# Patient Record
Sex: Female | Born: 1965
Health system: Southern US, Community
[De-identification: ages and names within clinical notes are randomized; demographics above are authoritative.]

## PROBLEM LIST (undated history)

## (undated) DIAGNOSIS — Z801 Family history of malignant neoplasm of trachea, bronchus and lung: Secondary | ICD-10-CM

## (undated) DIAGNOSIS — Z923 Personal history of irradiation: Secondary | ICD-10-CM

## (undated) DIAGNOSIS — Z800 Family history of malignant neoplasm of digestive organs: Secondary | ICD-10-CM

## (undated) HISTORY — PX: OTHER SURGICAL HISTORY: SHX169

## (undated) HISTORY — DX: Family history of malignant neoplasm of trachea, bronchus and lung: Z80.1

## (undated) HISTORY — DX: Family history of malignant neoplasm of digestive organs: Z80.0

---

## 2016-09-18 ENCOUNTER — Encounter: Payer: Self-pay | Admitting: Osteopathic Medicine

## 2016-09-18 ENCOUNTER — Ambulatory Visit (INDEPENDENT_AMBULATORY_CARE_PROVIDER_SITE_OTHER): Payer: BLUE CROSS/BLUE SHIELD | Admitting: Osteopathic Medicine

## 2016-09-18 VITALS — BP 123/69 | HR 67 | Ht 61.0 in | Wt 177.0 lb

## 2016-09-18 DIAGNOSIS — N951 Menopausal and female climacteric states: Secondary | ICD-10-CM | POA: Diagnosis not present

## 2016-09-18 DIAGNOSIS — Z0000 Encounter for general adult medical examination without abnormal findings: Secondary | ICD-10-CM

## 2016-09-18 DIAGNOSIS — G4700 Insomnia, unspecified: Secondary | ICD-10-CM | POA: Diagnosis not present

## 2016-09-18 DIAGNOSIS — E559 Vitamin D deficiency, unspecified: Secondary | ICD-10-CM

## 2016-09-18 DIAGNOSIS — J302 Other seasonal allergic rhinitis: Secondary | ICD-10-CM | POA: Diagnosis not present

## 2016-09-18 LAB — CBC WITH DIFFERENTIAL/PLATELET
BASOS PCT: 0 %
Basophils Absolute: 0 cells/uL (ref 0–200)
Eosinophils Absolute: 41 cells/uL (ref 15–500)
Eosinophils Relative: 1 %
HCT: 37.1 % (ref 35.0–45.0)
Hemoglobin: 12.1 g/dL (ref 11.7–15.5)
LYMPHS PCT: 24 %
Lymphs Abs: 984 cells/uL (ref 850–3900)
MCH: 27.9 pg (ref 27.0–33.0)
MCHC: 32.6 g/dL (ref 32.0–36.0)
MCV: 85.7 fL (ref 80.0–100.0)
MPV: 11.5 fL (ref 7.5–12.5)
Monocytes Absolute: 492 cells/uL (ref 200–950)
Monocytes Relative: 12 %
NEUTROS PCT: 63 %
Neutro Abs: 2583 cells/uL (ref 1500–7800)
Platelets: 202 10*3/uL (ref 140–400)
RBC: 4.33 MIL/uL (ref 3.80–5.10)
RDW: 14.9 % (ref 11.0–15.0)
WBC: 4.1 10*3/uL (ref 3.8–10.8)

## 2016-09-18 LAB — LIPID PANEL
CHOL/HDL RATIO: 2.6 ratio (ref <5.0)
CHOLESTEROL: 195 mg/dL (ref <200)
HDL: 75 mg/dL (ref >50)
LDL CALC: 112 mg/dL — AB (ref <100)
TRIGLYCERIDES: 42 mg/dL (ref <150)
VLDL: 8 mg/dL (ref <30)

## 2016-09-18 LAB — COMPLETE METABOLIC PANEL WITH GFR
ALT: 7 U/L (ref 6–29)
AST: 14 U/L (ref 10–35)
Albumin: 3.9 g/dL (ref 3.6–5.1)
Alkaline Phosphatase: 52 U/L (ref 33–115)
BUN: 8 mg/dL (ref 7–25)
CHLORIDE: 105 mmol/L (ref 98–110)
CO2: 26 mmol/L (ref 20–31)
CREATININE: 0.64 mg/dL (ref 0.50–1.10)
Calcium: 9 mg/dL (ref 8.6–10.2)
GFR, Est Non African American: >89 mL/min (ref >=60)
Glucose, Bld: 86 mg/dL (ref 65–99)
POTASSIUM: 4.3 mmol/L (ref 3.5–5.3)
Sodium: 138 mmol/L (ref 135–146)
Total Bilirubin: 0.3 mg/dL (ref 0.2–1.2)
Total Protein: 6.3 g/dL (ref 6.1–8.1)

## 2016-09-18 LAB — TSH: TSH: 1.82 mIU/L

## 2016-09-18 MED ORDER — TRAZODONE HCL 50 MG PO TABS: 25.0000 mg | ORAL_TABLET | Freq: Every day | ORAL | 0 refills | Status: DC

## 2016-09-18 MED ORDER — FLUTICASONE PROPIONATE 50 MCG/ACT NA SUSP: 2.0000 | Freq: Every day | NASAL | 6 refills | Status: DC

## 2016-09-18 NOTE — Progress Notes (Addendum)
HPI: Rebecca Bullock is a 51 y.o. female  who presents to [facility] today, 09/25/16,  for chief complaint of:  Chief Complaint  Patient presents with  . Establish Care    annual and menopause concerns   Perimenopause: irregular periods, occasionally w/ prolonged bleeding versus few months between periods. Ongoing about a year. Not particularly painful but duration is bothersome.   Seasonal allergies: antihistamines tend to cause sleepiness, Never tried nasal sprays on a routine basis.   Sleep: problems sleeping several nights out of the week. Melatonin occasionally helpful when takes this an hour or so before bedtime. Onset and maintenance is a problem. Is avoiding bright lights and screens.   Due for annual physical. >15 years since last checkup   Past medical, surgical, social and family history reviewed: There are no active problems to display for this patient.  History reviewed. No pertinent surgical history. Social History  Substance Use Topics  . Smoking status: Never Smoker  . Smokeless tobacco: Never Used  . Alcohol use Not on file   Family History  Problem Relation Age of Onset  . Alcohol abuse Maternal Uncle   . Cancer Mother   . Cancer Father   . Diabetes Maternal Grandmother      Current medication list and allergy/intolerance information reviewed:   Current Outpatient Prescriptions  Medication Sig Dispense Refill  . fluticasone (FLONASE) 50 MCG/ACT nasal spray Place 2 sprays into both nostrils daily. 16 g 6  . traZODone (DESYREL) 50 MG tablet Take 0.5-3 tablets (25-150 mg total) by mouth at bedtime. 90 tablet 0  . Vitamin D, Ergocalciferol, (DRISDOL) 50000 units CAPS capsule Take 1 capsule (50,000 Units total) by mouth every 7 (seven) days. Take for 8 total doses(weeks) 8 capsule 1   No current facility-administered medications for this visit.    No Known Allergies    Review of Systems:  Constitutional:  No  fever, no  chills, No recent illness, No unintentional weight changes. No significant fatigue.   HEENT: No  headache, no hearing change, No sore throat, No  sinus pressure, +hay fever/allergies   Cardiac: No  chest pain, No  pressure, No palpitations, No  Orthopnea  Respiratory:  No  shortness of breath. No  Cough  Gastrointestinal: No  abdominal pain, No  nausea, No  vomiting,  No  blood in stool, No  diarrhea, No  constipation   Musculoskeletal: No new myalgia/arthralgia  Genitourinary: No  incontinence, No  abnormal genital bleeding, No abnormal genital discharge  Skin: No  Rash, No other wounds/concerning lesions  Hem/Onc: No  easy bruising/bleeding, No  abnormal lymph node  Endocrine: No cold intolerance,  No heat intolerance. No polyuria/polydipsia/polyphagia   Neurologic: No  weakness, No  dizziness, No  slurred speech/focal weakness/facial droop  Psychiatric: No  concerns with depression, No  concerns with anxiety, +sleep problems, No mood problems  Exam:  BP 123/69   Pulse 67   Ht 5\' 1"  (1.549 m)   Wt 177 lb (80.3 kg)   BMI 33.44 kg/m   Constitutional: VS see above. General Appearance: alert, well-developed, well-nourished, NAD  Eyes: Normal lids and conjunctive, non-icteric sclera  Ears, Nose, Mouth, Throat: MMM, Normal external inspection ears/nares/mouth/lips/gums.   Neck: No masses, trachea midline.   Respiratory: Normal respiratory effort. no wheeze, no rhonchi, no rales  Cardiovascular: S1/S2 normal, no murmur, no rub/gallop auscultated. RRR.   Musculoskeletal: Gait normal. No clubbing/cyanosis of digits.   Neurological: Normal balance/coordination. No tremor.  Skin: warm, dry, intact.   Psychiatric: Normal judgment/insight. Normal mood and affect. Oriented x3.     ASSESSMENT/PLAN:   Perimenopausal - Consider OCP for regulation vs Mirena or OBGYN referral for heavy bleeding  Insomnia, unspecified type - Trial trazodone, info printed on sleep hygiene -  Plan: traZODone (DESYREL) 50 MG tablet  Seasonal allergic rhinitis, unspecified trigger - Allegra may be less sedating. Nasal steroid trial +/- oral meds.  - Plan: fluticasone (FLONASE) 50 MCG/ACT nasal spray  Annual physical exam - labs ordered for future visit - preventive care was not performed or billed today - Plan: CBC with Differential/Platelet, COMPLETE METABOLIC PANEL WITH GFR, Lipid panel, TSH, VITAMIN D 25 Hydroxy (Vit-D Deficiency, Fractures)  Vitamin D deficiency    Visit summary with medication list and pertinent instructions was printed for patient to review. All questions at time of visit were answered - patient instructed to contact office with any additional concerns. ER/RTC precautions were reviewed with the patient. Follow-up plan: Return in about 4 weeks (around 10/16/2016) for [employer] .  Note: Total time spent 30 minutes, greater than 50% of the visit was spent face-to-face counseling and coordinating care for the following: The primary encounter diagnosis was Perimenopausal. Diagnoses of Insomnia, unspecified type, Seasonal allergic rhinitis, unspecified trigger were also pertinent to this visit.Marland Kitchen

## 2016-09-19 LAB — VITAMIN D 25 HYDROXY (VIT D DEFICIENCY, FRACTURES): Vit D, 25-Hydroxy: 16 ng/mL — ABNORMAL LOW (ref 30–100)

## 2016-09-25 DIAGNOSIS — J302 Other seasonal allergic rhinitis: Secondary | ICD-10-CM | POA: Insufficient documentation

## 2016-09-25 DIAGNOSIS — N951 Menopausal and female climacteric states: Secondary | ICD-10-CM | POA: Insufficient documentation

## 2016-09-25 DIAGNOSIS — E559 Vitamin D deficiency, unspecified: Secondary | ICD-10-CM | POA: Insufficient documentation

## 2016-09-25 DIAGNOSIS — G4700 Insomnia, unspecified: Secondary | ICD-10-CM | POA: Insufficient documentation

## 2016-09-25 MED ORDER — VITAMIN D (ERGOCALCIFEROL) 1.25 MG (50000 UNIT) PO CAPS: 50000.0000 [IU] | ORAL_CAPSULE | ORAL | 1 refills | Status: DC

## 2016-09-25 NOTE — Addendum Note (Signed)
Addended by: Maryla Morrow on: 09/25/2016 02:08 PM   Modules accepted: Orders

## 2016-10-02 ENCOUNTER — Encounter: Payer: Self-pay | Admitting: Osteopathic Medicine

## 2016-10-02 ENCOUNTER — Ambulatory Visit (INDEPENDENT_AMBULATORY_CARE_PROVIDER_SITE_OTHER): Payer: BLUE CROSS/BLUE SHIELD | Admitting: Osteopathic Medicine

## 2016-10-02 ENCOUNTER — Other Ambulatory Visit (HOSPITAL_COMMUNITY)
Admission: RE | Admit: 2016-10-02 | Discharge: 2016-10-02 | Disposition: A | Discharge status: Home / Self Care | Payer: BLUE CROSS/BLUE SHIELD | Source: Ambulatory Visit | Attending: Osteopathic Medicine | Admitting: Osteopathic Medicine

## 2016-10-02 VITALS — BP 128/76 | HR 81 | Ht 61.0 in | Wt 173.0 lb

## 2016-10-02 DIAGNOSIS — Z124 Encounter for screening for malignant neoplasm of cervix: Secondary | ICD-10-CM | POA: Insufficient documentation

## 2016-10-02 DIAGNOSIS — Z0000 Encounter for general adult medical examination without abnormal findings: Secondary | ICD-10-CM | POA: Diagnosis not present

## 2016-10-02 DIAGNOSIS — N939 Abnormal uterine and vaginal bleeding, unspecified: Secondary | ICD-10-CM | POA: Diagnosis not present

## 2016-10-02 MED ORDER — ONDANSETRON 4 MG PO TBDP: 8.0000 mg | ORAL_TABLET | Freq: Three times a day (TID) | ORAL | 1 refills | Status: DC | PRN

## 2016-10-02 MED ORDER — NORETHINDRONE-ETH ESTRADIOL 1-35 MG-MCG PO TABS: ORAL_TABLET | ORAL | 1 refills | Status: DC

## 2016-10-02 NOTE — Patient Instructions (Addendum)
Plan:  If bleeding persists, our options are:  Medication to help stop the bleeding and reset your cycle  +/- Mirena IUD insertion if that doesn't work  Referral to OBGYN to discuss if procedure such as ablation or hysterectomy is an option if this persists  Annual physical: No concerns at this time Plan to recheck in one year, sooner if needed  Any refills needed, please let us know, we can take care of it for the next year!

## 2016-10-02 NOTE — Progress Notes (Signed)
HPI: Rebecca Bullock is a 51 y.o. female  who presents to [facility] today, 10/02/16,  for chief complaint of:  Chief Complaint  Patient presents with  . Annual Exam  . Gynecologic Exam    Annual physical: See below for review of preventive care  Additional problem follow-up: Abnormal uterine bleeding. Patient is perimenopausal, has been having irregular periods, heavy bleeding on and off for several months at this point. Labs do not demonstrate anemia, patient states that occasionally she is having days where she uses tampon and pad every hour or so, lately has been a good deal B she would still like something to stop this.  Past medical, surgical, social and family history reviewed: Patient Active Problem List   Diagnosis Date Noted  . Perimenopausal 09/25/2016  . Insomnia 09/25/2016  . Seasonal allergic rhinitis 09/25/2016  . Vitamin D deficiency 09/25/2016   No past surgical history on file. Social History  Substance Use Topics  . Smoking status: Never Smoker  . Smokeless tobacco: Never Used  . Alcohol use Not on file   Family History  Problem Relation Age of Onset  . Alcohol abuse Maternal Uncle   . Cancer Mother   . Cancer Father   . Diabetes Maternal Grandmother      Current medication list and allergy/intolerance information reviewed:   Current Outpatient Prescriptions  Medication Sig Dispense Refill  . fluticasone (FLONASE) 50 MCG/ACT nasal spray Place 2 sprays into both nostrils daily. 16 g 6  . traZODone (DESYREL) 50 MG tablet Take 0.5-3 tablets (25-150 mg total) by mouth at bedtime. 90 tablet 0  . Vitamin D, Ergocalciferol, (DRISDOL) 50000 units CAPS capsule Take 1 capsule (50,000 Units total) by mouth every 7 (seven) days. Take for 8 total doses(weeks) 8 capsule 1   No current facility-administered medications for this visit.    No Known Allergies    Review of Systems:  Constitutional:  No  fever, no chills, No recent  illness  HEENT: No  headache, no vision change  Cardiac: No  chest pain, No  pressure, No palpitationsOrthopnea  Respiratory:  No  shortness of breath. No  Cough  Gastrointestinal: No  abdominal pain, No  nausea  Musculoskeletal: No new myalgia/arthralgia  Genitourinary: No  incontinence, +abnormal genital bleeding, No abnormal genital discharge  Skin: No  Rash, No other wounds/concerning lesions  Neurologic: No  weakness, No  dizziness,   Psychiatric: No  concerns with depression, No  concerns with anxiety, No sleep problems, No mood problems  Exam:  BP 128/76   Pulse 81   Ht '5\' 1"'  (1.549 m)   Wt 173 lb (78.5 kg)   BMI 32.69 kg/m   Constitutional: VS see above. General Appearance: alert, well-developed, well-nourished, NAD  Eyes: Normal lids and conjunctive, non-icteric sclera  Ears, Nose, Mouth, Throat: MMM, Normal external inspection ears/nares/mouth/lips/gums.   Neck: No masses, trachea midline. No thyroid enlargement. No tenderness/mass appreciated. No lymphadenopathy  Respiratory: Normal respiratory effort. no wheeze, no rhonchi, no rales  Cardiovascular: S1/S2 normal, no murmur, no rub/gallop auscultated. RRR. No lower extremity edema.   Gastrointestinal: Nontender, no masses. No hepatomegaly, no splenomegaly. No hernia appreciated. Bowel sounds normal. Rectal exam deferred.   Musculoskeletal: Gait normal. No clubbing/cyanosis of digits.   Neurological: Normal balance/coordination. No tremor. No cranial nerve deficit on limited exam. Motor and sensation intact and symmetric. Cerebellar reflexes intact.   Skin: warm, dry, intact. No rash/ulcer. No concerning nevi or subq nodules on limited exam.  Psychiatric: Normal judgment/insight. Normal mood and affect. Oriented x3.  GYN: No lesions/ulcers to external genitalia, normal urethra, normal vaginal mucosa, physiologic discharge and normal blood, parous cervix normal without concerning lesions, small bartholin  cyst but no visible polyp, uterus not enlarged or tender, adnexa no masses and nontender  BREAST: No rashes/skin changes, normal fibrous breast tissue, no masses or tenderness, normal nipple without discharge, normal axilla   Recent Results (from the past 2160 hour(s))  CBC with Differential/Platelet     Status: None   Collection Time: 09/18/16  9:41 AM  Result Value Ref Range   WBC 4.1 3.8 - 10.8 K/uL   RBC 4.33 3.80 - 5.10 MIL/uL   Hemoglobin 12.1 11.7 - 15.5 g/dL   HCT 37.1 35.0 - 45.0 %   MCV 85.7 80.0 - 100.0 fL   MCH 27.9 27.0 - 33.0 pg   MCHC 32.6 32.0 - 36.0 g/dL   RDW 14.9 11.0 - 15.0 %   Platelets 202 140 - 400 K/uL   MPV 11.5 7.5 - 12.5 fL   Neutro Abs 2,583 1,500 - 7,800 cells/uL   Lymphs Abs 984 850 - 3,900 cells/uL   Monocytes Absolute 492 200 - 950 cells/uL   Eosinophils Absolute 41 15 - 500 cells/uL   Basophils Absolute 0 0 - 200 cells/uL   Neutrophils Relative % 63 %   Lymphocytes Relative 24 %   Monocytes Relative 12 %   Eosinophils Relative 1 %   Basophils Relative 0 %   Smear Review Criteria for review not met   COMPLETE METABOLIC PANEL WITH GFR     Status: None   Collection Time: 09/18/16  9:41 AM  Result Value Ref Range   Sodium 138 135 - 146 mmol/L   Potassium 4.3 3.5 - 5.3 mmol/L   Chloride 105 98 - 110 mmol/L   CO2 26 20 - 31 mmol/L   Glucose, Bld 86 65 - 99 mg/dL   BUN 8 7 - 25 mg/dL   Creat 0.64 0.50 - 1.10 mg/dL   Total Bilirubin 0.3 0.2 - 1.2 mg/dL   Alkaline Phosphatase 52 33 - 115 U/L   AST 14 10 - 35 U/L   ALT 7 6 - 29 U/L   Total Protein 6.3 6.1 - 8.1 g/dL   Albumin 3.9 3.6 - 5.1 g/dL   Calcium 9.0 8.6 - 10.2 mg/dL   GFR, Est African American >89 >=60 mL/min   GFR, Est Non African American >89 >=60 mL/min  Lipid panel     Status: Abnormal   Collection Time: 09/18/16  9:41 AM  Result Value Ref Range   Cholesterol 195 <200 mg/dL   Triglycerides 42 <150 mg/dL   HDL 75 >50 mg/dL   Total CHOL/HDL Ratio 2.6 <5.0 Ratio   VLDL 8 <30  mg/dL   LDL Cholesterol 112 (H) <100 mg/dL  TSH     Status: None   Collection Time: 09/18/16  9:41 AM  Result Value Ref Range   TSH 1.82 mIU/L    Comment:   Reference Range   > or = 20 Years  0.40-4.50   Pregnancy Range First trimester  0.26-2.66 Second trimester 0.55-2.73 Third trimester  0.43-2.91     VITAMIN D 25 Hydroxy (Vit-D Deficiency, Fractures)     Status: Abnormal   Collection Time: 09/18/16  9:41 AM  Result Value Ref Range   Vit D, 25-Hydroxy 16 (L) 30 - 100 ng/mL    Comment: Vitamin D Status  25-OH Vitamin D        Deficiency                <20 ng/mL        Insufficiency         20 - 29 ng/mL        Optimal             > or = 30 ng/mL   For 25-OH Vitamin D testing on patients on D2-supplementation and patients for whom quantitation of D2 and D3 fractions is required, the QuestAssureD 25-OH VIT D, (D2,D3), LC/MS/MS is recommended: order code [phone] (patients > 2 yrs).      ASSESSMENT/PLAN:   Annual physical exam  Cervical cancer screening - Plan: Cytology - PAP  Abnormal uterine bleeding - High-dose birth control, added nausea medication in case side effects. Patient would like referral to OB/GYN to discuss ablation vs hysterectomy vs Mirena - Plan: norethindrone-ethinyl estradiol 1/35 ([place] 1/35, 28,) tablet, ondansetron (ZOFRAN-ODT) 4 MG disintegrating tablet, Ambulatory referral to Obstetrics / Gynecology   FEMALE PREVENTIVE CARE Updated 10/02/16   ANNUAL SCREENING/COUNSELING  Diet/Exercise - HEALTHY HABITS DISCUSSED TO DECREASE CV RISK History  Smoking Status  . Never Smoker  Smokeless Tobacco  . Never Used   History  Alcohol use Not on file  About 1-2 per week   Depression screen [facility] 2/9 09/18/2016  Decreased Interest 0  Down, Depressed, Hopeless 0  PHQ - 2 Score 0    Domestic violence concerns - no  HTN SCREENING - SEE [place]  Sexually active in the past year - Yes with female.  Need/want STI testing  today? - no  Concerns about libido or pain with sex? - no  Plans for pregnancy? - none  INFECTIOUS DISEASE SCREENING  HIV - does not need  GC/CT - does not need  HepC - DOB [birth year] - does not need  TB - does not need  DISEASE SCREENING  Lipid - does not need  DM2 - does not need  Osteoporosis - women age 22+ - does not need  CANCER SCREENING  Cervical - needs  Breast - does not need  Lung - does not need  Colon - does not need  ADULT VACCINATION  Influenza - annual vaccine recommended  Td - booster every 10 years - declined today   Zoster - option at 12, yes at 60+   PCV13 - was not indicated  PPSV23 - was not indicated  There is no immunization history on file for this patient.     Patient Instructions  Plan:  If bleeding persists, our options are:  Medication to help stop the bleeding and reset your cycle  +/- Mirena IUD insertion if that doesn't work  Referral to OBGYN to discuss if procedure such as ablation or hysterectomy is an option if this persists  Annual physical: No concerns at this time Plan to recheck in one year, sooner if needed  Any refills needed, please let us know, we can take care of it for the next year!     Visit summary with medication list and pertinent instructions was printed for patient to review. All questions at time of visit were answered - patient instructed to contact office with any additional concerns. ER/RTC precautions were reviewed with the patient. Follow-up plan: Return in about 1 year (around 10/02/2017) for ANNUAL PHYSICAL, SOONER IF NEEDED .

## 2016-10-03 LAB — CYTOLOGY - PAP
Diagnosis: NEGATIVE
HPV: NOT DETECTED

## 2016-10-16 DIAGNOSIS — N921 Excessive and frequent menstruation with irregular cycle: Secondary | ICD-10-CM | POA: Diagnosis not present

## 2016-10-16 DIAGNOSIS — Z3202 Encounter for pregnancy test, result negative: Secondary | ICD-10-CM | POA: Diagnosis not present

## 2016-10-25 DIAGNOSIS — N921 Excessive and frequent menstruation with irregular cycle: Secondary | ICD-10-CM | POA: Diagnosis not present

## 2016-10-25 DIAGNOSIS — D251 Intramural leiomyoma of uterus: Secondary | ICD-10-CM | POA: Diagnosis not present

## 2016-11-07 ENCOUNTER — Encounter: Payer: BLUE CROSS/BLUE SHIELD | Admitting: Obstetrics & Gynecology

## 2016-11-19 DIAGNOSIS — N920 Excessive and frequent menstruation with regular cycle: Secondary | ICD-10-CM | POA: Insufficient documentation

## 2016-11-19 DIAGNOSIS — N921 Excessive and frequent menstruation with irregular cycle: Secondary | ICD-10-CM | POA: Diagnosis not present

## 2016-11-21 DIAGNOSIS — N921 Excessive and frequent menstruation with irregular cycle: Secondary | ICD-10-CM | POA: Diagnosis not present

## 2016-11-21 DIAGNOSIS — R938 Abnormal findings on diagnostic imaging of other specified body structures: Secondary | ICD-10-CM | POA: Diagnosis not present

## 2016-11-21 DIAGNOSIS — N920 Excessive and frequent menstruation with regular cycle: Secondary | ICD-10-CM | POA: Diagnosis not present

## 2016-11-21 DIAGNOSIS — D259 Leiomyoma of uterus, unspecified: Secondary | ICD-10-CM | POA: Diagnosis not present

## 2016-12-20 DIAGNOSIS — N921 Excessive and frequent menstruation with irregular cycle: Secondary | ICD-10-CM | POA: Diagnosis not present

## 2016-12-20 DIAGNOSIS — Z09 Encounter for follow-up examination after completed treatment for conditions other than malignant neoplasm: Secondary | ICD-10-CM | POA: Diagnosis not present

## 2017-09-30 ENCOUNTER — Encounter: Payer: BLUE CROSS/BLUE SHIELD | Admitting: Osteopathic Medicine

## 2018-06-24 ENCOUNTER — Ambulatory Visit (INDEPENDENT_AMBULATORY_CARE_PROVIDER_SITE_OTHER): Payer: BLUE CROSS/BLUE SHIELD | Admitting: Osteopathic Medicine

## 2018-06-24 ENCOUNTER — Encounter: Payer: Self-pay | Admitting: Osteopathic Medicine

## 2018-06-24 ENCOUNTER — Telehealth: Payer: Self-pay | Admitting: Osteopathic Medicine

## 2018-06-24 VITALS — BP 111/74 | HR 73 | Temp 98.2°F | Wt 191.2 lb

## 2018-06-24 DIAGNOSIS — Z113 Encounter for screening for infections with a predominantly sexual mode of transmission: Secondary | ICD-10-CM

## 2018-06-24 DIAGNOSIS — Z1211 Encounter for screening for malignant neoplasm of colon: Secondary | ICD-10-CM

## 2018-06-24 DIAGNOSIS — Z1239 Encounter for other screening for malignant neoplasm of breast: Secondary | ICD-10-CM | POA: Diagnosis not present

## 2018-06-24 DIAGNOSIS — Z0000 Encounter for general adult medical examination without abnormal findings: Secondary | ICD-10-CM

## 2018-06-24 NOTE — Patient Instructions (Addendum)
General Preventive Care  Most recent routine screening lipids/other labs: ordered today.   Everyone should have blood pressure checked once per year.   Tobacco: don't!   Alcohol: responsible moderation is ok for most adults - if you have concerns about your alcohol intake, please talk to me!   Exercise: as tolerated to reduce risk of cardiovascular disease and diabetes. Strength training will also prevent osteoporosis.   Mental health: if need for mental health care (medicines, counseling, other), or concerns about moods, please let me know!   Reproductive/Sexual health: if need for STD testing, or if concerns with libido/pain problems, please let me know! If you need to discuss your birth control options, please let me know!   "Suppressive therapy [infected partner taking Valacyclovir daily] led to a significant reduction in overall acquisition of genital HSV-2 infection in the uninfected partner (1.9 versus 3.6 percent)" compared to placebo.   Advanced Directive: Living Will and/or Healthcare Power of Attorney recommended for all adults, regardless of age or health.  Vaccines  Flu vaccine: recommended for almost everyone, every fall.   Shingles vaccine: Shingrix recommended after age 79 - will call once this is available.   Pneumonia vaccines: Prevnar and Pneumovax recommended after age 29, or sooner if certain medical conditions.  Tetanus booster: Tdap recommended every 10 years.  Cancer screenings   Colon cancer screening: recommended for everyone at age 61. Cologuard ordered. If this is negative, repeat testing in 3 year, if positive, will need colonoscopy.   Breast cancer screening: mammogram recommended annually after age 56.   Cervical cancer screening: Pap due 09/2021. Can usually stop at age 9 or w/ hysterectomy.   Lung cancer screening: not needed if never smoker  Infection screenings . HIV: recommended screening at least once age 50-65, more often as  needed. . Gonorrhea/Chlamydia: screening as needed. . Hepatitis C: recommended once for anyone born 83-1963 . TB: certain at-risk populations, or depending on work requirements and/or travel history Other . Bone Density Test: recommended for women at age 23

## 2018-06-24 NOTE — Telephone Encounter (Signed)
-----   Message from Emeterio Reeve, DO sent at 06/24/2018  9:51 AM EST ----- Regarding: shingrix Shingles vax list! Please and thanks

## 2018-06-24 NOTE — Progress Notes (Signed)
HPI: Rebecca Bullock is a 53 y.o. female who  has no past medical history on file.  she presents to [facility] today, 06/24/18,  for chief complaint of: Annual Physical     Patient here for annual physical / wellness exam.  See preventive care reviewed as below.   Additional concerns today include: dating a man who is (+)HSV, have not had sex yet, would like to know about prevention.       Past medical, surgical, social and family history reviewed:  Patient Active Problem List   Diagnosis Date Noted  . Perimenopausal 09/25/2016  . Insomnia 09/25/2016  . Seasonal allergic rhinitis 09/25/2016  . Vitamin D deficiency 09/25/2016    No past surgical history on file.  Social History   Tobacco Use  . Smoking status: Never Smoker  . Smokeless tobacco: Never Used  Substance Use Topics  . Alcohol use: Not on file    Family History  Problem Relation Age of Onset  . Alcohol abuse Maternal Uncle   . Cancer Mother   . Cancer Father   . Diabetes Maternal Grandmother      Current medication list and allergy/intolerance information reviewed:    Current Outpatient Medications  Medication Sig Dispense Refill  . fluticasone (FLONASE) 50 MCG/ACT nasal spray Place 2 sprays into both nostrils daily. 16 g 6  . norethindrone-ethinyl estradiol 1/35 ([place] 1/35, 28,) tablet 1 tablet po qid x 2 - 4 days until bleeding stops then 1 tablet tid x 7 days then 1 tablet bid x 2 days then 1 tablet daily x 3 weeks then  Skip one week - allow withdrawal bleed then Cycle on pills for 3 months (Patient not taking: Reported on 06/24/2018) 3 Package 1  . ondansetron (ZOFRAN-ODT) 4 MG disintegrating tablet Take 2 tablets (8 mg total) by mouth every 8 (eight) hours as needed for nausea or vomiting. (Patient not taking: Reported on 06/24/2018) 30 tablet 1  . traZODone (DESYREL) 50 MG tablet Take 0.5-3 tablets (25-150 mg total) by mouth at bedtime. (Patient not  taking: Reported on 06/24/2018) 90 tablet 0  . Vitamin D, Ergocalciferol, (DRISDOL) 50000 units CAPS capsule Take 1 capsule (50,000 Units total) by mouth every 7 (seven) days. Take for 8 total doses(weeks) (Patient not taking: Reported on 06/24/2018) 8 capsule 1   No current facility-administered medications for this visit.     No Known Allergies    Review of Systems:  Constitutional:  No  fever, no chills, No recent illness, No unintentional weight changes. No significant fatigue.   HEENT: No  headache, no vision change, no hearing change, No sore throat, No  sinus pressure  Cardiac: No  chest pain, No  pressure, No palpitations  Respiratory:  No  shortness of breath. No  Cough  Gastrointestinal: No  abdominal pain, No  nausea, No  vomiting,  No  blood in stool, No  diarrhea, No  constipation   Musculoskeletal: No new myalgia/arthralgia  Skin: No  Rash, No other wounds/concerning lesions  Genitourinary: No  incontinence, No  abnormal genital bleeding, No abnormal genital discharge  Hem/Onc: No  easy bruising/bleeding, No  abnormal lymph node  Endocrine: No cold intolerance,  No heat intolerance. No polyuria/polydipsia/polyphagia   Neurologic: No  weakness, No  dizziness  Psychiatric: No  concerns with depression, No  concerns with anxiety, No sleep problems, No mood problems  Exam:  BP 111/74 (BP Location: Left Arm, Patient Position: Sitting, Cuff Size: Normal)  Pulse 73   Temp 98.2 F (36.8 C) (Oral)   Wt 191 lb 3.2 oz (86.7 kg)   BMI 36.13 kg/m   Constitutional: VS see above. General Appearance: alert, well-developed, well-nourished, NAD  Eyes: Normal lids and conjunctive, non-icteric sclera  Ears, Nose, Mouth, Throat: MMM, Normal external inspection ears/nares/mouth/lips/gums. TM normal bilaterally. Pharynx/tonsils no erythema, no exudate. Nasal mucosa normal.   Neck: No masses, trachea midline. No thyroid enlargement. No tenderness/mass appreciated. No  lymphadenopathy  Respiratory: Normal respiratory effort. no wheeze, no rhonchi, no rales  Cardiovascular: S1/S2 normal, no murmur, no rub/gallop auscultated. RRR. No lower extremity edema. Pedal pulse II/IV bilaterally DP and PT. No carotid bruit or JVD. No abdominal aortic bruit.  Gastrointestinal: Nontender, no masses. No hepatomegaly, no splenomegaly. No hernia appreciated. Bowel sounds normal. Rectal exam deferred.   Musculoskeletal: Gait normal. No clubbing/cyanosis of digits.   Neurological: Normal balance/coordination. No tremor. No cranial nerve deficit on limited exam. Motor and sensation intact and symmetric. Cerebellar reflexes intact.   Skin: warm, dry, intact. No rash/ulcer. No concerning nevi or subq nodules on limited exam.    Psychiatric: Normal judgment/insight. Normal mood and affect. Oriented x3.      ASSESSMENT/PLAN: The primary encounter diagnosis was Annual physical exam. Diagnoses of Routine screening for STI (sexually transmitted infection), Screening for malignant neoplasm of colon, and Breast cancer screening were also pertinent to this visit.  Advised discuss antivirals w/ her potential partner, if e gets on meds this will reduce her risk of acquiring HSV.    Orders Placed This Encounter  Procedures  . C. trachomatis/N. gonorrhoeae RNA  . MM 3D SCREEN BREAST BILATERAL  . CBC  . COMPLETE METABOLIC PANEL WITH GFR  . Lipid panel  . Hepatitis B core antibody, total  . Hepatitis B surface antigen  . HIV Antibody (routine testing w rflx)  . RPR  . Hepatitis C antibody  . HSV(herpes simplex vrs) 1+2 ab-IgG  . HSV(herpes simplex vrs) 1+2 ab-IgM  . Cologuard     Patient Instructions  General Preventive Care  Most recent routine screening lipids/other labs: ordered today.   Everyone should have blood pressure checked once per year.   Tobacco: don't!   Alcohol: responsible moderation is ok for most adults - if you have concerns about your alcohol  intake, please talk to me!   Exercise: as tolerated to reduce risk of cardiovascular disease and diabetes. Strength training will also prevent osteoporosis.   Mental health: if need for mental health care (medicines, counseling, other), or concerns about moods, please let me know!   Reproductive/Sexual health: if need for STD testing, or if concerns with libido/pain problems, please let me know! If you need to discuss your birth control options, please let me know!   "Suppressive therapy [infected partner taking Valacyclovir daily] led to a significant reduction in overall acquisition of genital HSV-2 infection in the uninfected partner (1.9 versus 3.6 percent)" compared to placebo.   Advanced Directive: Living Will and/or Healthcare Power of Attorney recommended for all adults, regardless of age or health.  Vaccines  Flu vaccine: recommended for almost everyone, every fall.   Shingles vaccine: Shingrix recommended after age 49 - will call once this is available.   Pneumonia vaccines: Prevnar and Pneumovax recommended after age 62, or sooner if certain medical conditions.  Tetanus booster: Tdap recommended every 10 years.  Cancer screenings   Colon cancer screening: recommended for everyone at age 2. Cologuard ordered. If this is negative, repeat testing in  3 year, if positive, will need colonoscopy.   Breast cancer screening: mammogram recommended annually after age 51.   Cervical cancer screening: Pap due 09/2021. Can usually stop at age 13 or w/ hysterectomy.   Lung cancer screening: not needed if never smoker  Infection screenings . HIV: recommended screening at least once age 61-65, more often as needed. . Gonorrhea/Chlamydia: screening as needed. . Hepatitis C: recommended once for anyone born 51-1963 . TB: certain at-risk populations, or depending on work requirements and/or travel history Other . Bone Density Test: recommended for women at age 39       Visit  summary with medication list and pertinent instructions was printed for patient to review. All questions at time of visit were answered - patient instructed to contact office with any additional concerns or updates. ER/RTC precautions were reviewed with the patient.     Please note: voice recognition software was used to produce this document, and typos may escape review. Please contact Dr. Sheppard Coil for any needed clarifications.     Follow-up plan: Return in about 1 year (around 06/25/2019) for [place], sooner if needed / based on lab results .

## 2018-06-24 NOTE — Telephone Encounter (Signed)
Added

## 2018-06-25 LAB — HEPATITIS B SURFACE ANTIGEN: HEP B S AG: NONREACTIVE

## 2018-06-25 LAB — CBC
HEMATOCRIT: 36.9 % (ref 35.0–45.0)
Hemoglobin: 12.4 g/dL (ref 11.7–15.5)
MCH: 28.7 pg (ref 27.0–33.0)
MCHC: 33.6 g/dL (ref 32.0–36.0)
MCV: 85.4 fL (ref 80.0–100.0)
MPV: 11.6 fL (ref 7.5–12.5)
Platelets: 226 10*3/uL (ref 140–400)
RBC: 4.32 10*6/uL (ref 3.80–5.10)
RDW: 13.1 % (ref 11.0–15.0)
WBC: 4.6 10*3/uL (ref 3.8–10.8)

## 2018-06-25 LAB — COMPLETE METABOLIC PANEL WITH GFR
AG RATIO: 1.5 (calc) (ref 1.0–2.5)
ALKALINE PHOSPHATASE (APISO): 71 U/L (ref 33–130)
ALT: 9 U/L (ref 6–29)
AST: 10 U/L (ref 10–35)
Albumin: 3.9 g/dL (ref 3.6–5.1)
BILIRUBIN TOTAL: 0.3 mg/dL (ref 0.2–1.2)
BUN: 10 mg/dL (ref 7–25)
CHLORIDE: 106 mmol/L (ref 98–110)
CO2: 27 mmol/L (ref 20–32)
CREATININE: 0.67 mg/dL (ref 0.50–1.05)
Calcium: 9.1 mg/dL (ref 8.6–10.4)
GFR, Est African American: 119 mL/min/{1.73_m2} (ref >=60)
GFR, Est Non African American: 102 mL/min/{1.73_m2} (ref >=60)
GLOBULIN: 2.6 g/dL (ref 1.9–3.7)
Glucose, Bld: 89 mg/dL (ref 65–99)
POTASSIUM: 3.9 mmol/L (ref 3.5–5.3)
SODIUM: 140 mmol/L (ref 135–146)
Total Protein: 6.5 g/dL (ref 6.1–8.1)

## 2018-06-25 LAB — LIPID PANEL
CHOL/HDL RATIO: 3.3 (calc) (ref <5.0)
CHOLESTEROL: 212 mg/dL — AB (ref <200)
HDL: 64 mg/dL (ref >50)
LDL CHOLESTEROL (CALC): 131 mg/dL — AB
NON-HDL CHOLESTEROL (CALC): 148 mg/dL — AB (ref <130)
TRIGLYCERIDES: 77 mg/dL (ref <150)

## 2018-06-25 LAB — HEPATITIS C ANTIBODY
Hepatitis C Ab: NONREACTIVE
SIGNAL TO CUT-OFF: 0.03 (ref <1.00)

## 2018-06-25 LAB — C. TRACHOMATIS/N. GONORRHOEAE RNA
C. trachomatis RNA, TMA: NOT DETECTED
N. GONORRHOEAE RNA, TMA: NOT DETECTED

## 2018-06-25 LAB — HIV ANTIBODY (ROUTINE TESTING W REFLEX): HIV 1&2 Ab, 4th Generation: NONREACTIVE

## 2018-06-25 LAB — HSV(HERPES SIMPLEX VRS) I + II AB-IGG: HAV 1 IGG,TYPE SPECIFIC AB: <0.9 index

## 2018-06-25 LAB — HEPATITIS B CORE ANTIBODY, TOTAL: HEP B C TOTAL AB: NONREACTIVE

## 2018-06-25 LAB — RPR: RPR Ser Ql: NONREACTIVE

## 2018-07-16 ENCOUNTER — Ambulatory Visit: Payer: BLUE CROSS/BLUE SHIELD

## 2018-07-31 ENCOUNTER — Ambulatory Visit: Payer: BLUE CROSS/BLUE SHIELD

## 2018-11-26 DIAGNOSIS — D219 Benign neoplasm of connective and other soft tissue, unspecified: Secondary | ICD-10-CM | POA: Insufficient documentation

## 2018-11-26 DIAGNOSIS — R454 Irritability and anger: Secondary | ICD-10-CM | POA: Diagnosis not present

## 2018-11-26 DIAGNOSIS — N938 Other specified abnormal uterine and vaginal bleeding: Secondary | ICD-10-CM | POA: Diagnosis not present

## 2018-11-26 DIAGNOSIS — N939 Abnormal uterine and vaginal bleeding, unspecified: Secondary | ICD-10-CM | POA: Insufficient documentation

## 2018-11-26 DIAGNOSIS — N946 Dysmenorrhea, unspecified: Secondary | ICD-10-CM | POA: Diagnosis not present

## 2018-12-15 DIAGNOSIS — D219 Benign neoplasm of connective and other soft tissue, unspecified: Secondary | ICD-10-CM | POA: Diagnosis not present

## 2018-12-15 DIAGNOSIS — R454 Irritability and anger: Secondary | ICD-10-CM | POA: Diagnosis not present

## 2018-12-15 DIAGNOSIS — N938 Other specified abnormal uterine and vaginal bleeding: Secondary | ICD-10-CM | POA: Diagnosis not present

## 2018-12-15 DIAGNOSIS — N852 Hypertrophy of uterus: Secondary | ICD-10-CM | POA: Diagnosis not present

## 2018-12-15 DIAGNOSIS — R102 Pelvic and perineal pain: Secondary | ICD-10-CM | POA: Diagnosis not present

## 2019-01-23 ENCOUNTER — Other Ambulatory Visit: Payer: Self-pay

## 2019-01-23 ENCOUNTER — Ambulatory Visit (INDEPENDENT_AMBULATORY_CARE_PROVIDER_SITE_OTHER): Payer: BC Managed Care – PPO | Admitting: Physician Assistant

## 2019-01-23 ENCOUNTER — Encounter: Payer: Self-pay | Admitting: Physician Assistant

## 2019-01-23 VITALS — BP 130/64 | HR 73 | Temp 98.1°F | Ht 64.0 in | Wt 206.0 lb

## 2019-01-23 DIAGNOSIS — R3129 Other microscopic hematuria: Secondary | ICD-10-CM

## 2019-01-23 DIAGNOSIS — R21 Rash and other nonspecific skin eruption: Secondary | ICD-10-CM

## 2019-01-23 DIAGNOSIS — L292 Pruritus vulvae: Secondary | ICD-10-CM

## 2019-01-23 DIAGNOSIS — R82998 Other abnormal findings in urine: Secondary | ICD-10-CM | POA: Diagnosis not present

## 2019-01-23 LAB — POCT URINALYSIS DIPSTICK
Bilirubin, UA: NEGATIVE
Glucose, UA: NEGATIVE
Ketones, UA: NEGATIVE
Nitrite, UA: NEGATIVE
Protein, UA: NEGATIVE
Spec Grav, UA: >=1.03 — AB (ref 1.010–1.025)
Urobilinogen, UA: 0.2 E.U./dL
pH, UA: 5.5 (ref 5.0–8.0)

## 2019-01-23 MED ORDER — CLOBETASOL PROPIONATE 0.05 % EX OINT: 1.0000 "application " | TOPICAL_OINTMENT | Freq: Two times a day (BID) | CUTANEOUS | 0 refills | Status: DC

## 2019-01-23 NOTE — Progress Notes (Signed)
HPI:                                                                Cayson Wassell is a 53 y.o. female who presents to [place] [place] today for vaginal itching  Onset 2 weeks ago Waxing and waning Has tried changing body wash and using an OTC vaginal wipe She reports she went to the beach over the weekend and was sexually active with boyfriend 3 days in a row and noticed some soreness and some white spots on her external genitalia on Monday that have persisted She reports itching is worse at night  Denies abnormal vaginal discharge, dysuria, urgency, frequency Boyfriend has herpes Denies fever, chills, malaise, flu-like symptoms  She is followed by GYN Nunzio Cobbs) for menorrhagia and recently had a D&C  No past medical history on file. No past surgical history on file. Social History   Tobacco Use  . Smoking status: Never Smoker  . Smokeless tobacco: Never Used  Substance Use Topics  . Alcohol use: Not on file   family history includes Alcohol abuse in her maternal uncle; Cancer in her father and mother; Diabetes in her maternal grandmother.    ROS: negative except as noted in the HPI  Medications: Current Outpatient Medications  Medication Sig Dispense Refill  . fluticasone (FLONASE) 50 MCG/ACT nasal spray Place 2 sprays into both nostrils daily. 16 g 6   No current facility-administered medications for this visit.    No Known Allergies     Objective:  BP 130/64   Pulse 73   Temp 98.1 F (36.7 C) (Oral)   Ht 5\' 4"  (1.626 m)   Wt 206 lb (93.4 kg)   BMI 35.36 kg/m  Gen:  alert, not ill-appearing, no distress, appropriate for age 57: head normocephalic without obvious abnormality, conjunctiva and cornea clear, trachea midline Pulm: Normal work of breathing, normal phonation GU: whitish lichenified rash of posterior introitus/perineum  A chaperone was present for the GU portion of the exam, Izell [place],  RMA.    No results found for this or any previous visit (from the past 72 hour(s)). No results found.    Assessment and Plan: 53 y.o. female with   .Tatijana was seen today for vaginal itching.  Diagnoses and all orders for this visit:  Vulvar rash -     POCT Urinalysis Dipstick -     Urine Microscopic; Future -     Urine Culture -     Herpes simplex virus culture -     clobetasol ointment (TEMOVATE) 0.05 %; Apply 1 application topically 2 (two) times daily. Apply to affected area once daily  Vulvar pruritus -     POCT Urinalysis Dipstick -     Urine Microscopic; Future -     Urine Culture -     Herpes simplex virus culture -     clobetasol ointment (TEMOVATE) 0.05 %; Apply 1 application topically 2 (two) times daily. Apply to affected area once daily  Urine leukocytes increased -     Urine Microscopic; Future -     Urine Culture  Microscopic hematuria -     Urine Microscopic; Future -     Urine Culture   Rash has appearance of  lichen sclerosus HSV culture pending Start Clobetasole once daily for 1- 2 weeks Follow-up with GYN  UA positive for trace blood, small leuks Does not have typical UTI symptoms. Await urine micro and culture  Patient education and anticipatory guidance given Patient agrees with treatment plan Follow-up as needed if symptoms worsen or fail to improve  Darlyne Russian PA-C

## 2019-01-23 NOTE — Patient Instructions (Signed)
Lichen Sclerosus Lichen sclerosus is a skin problem. It can happen on any part of the body, but it commonly involves the anal or genital areas. It can cause itching and discomfort in these areas. Treatment can help to control symptoms. When the genital area is affected, getting treatment is important because the condition can cause scarring that may lead to other problems. What are the causes? The cause of this condition is not known. It may be related to an overactive immune system or a lack of certain hormones. Lichen sclerosus is not an infection or a fungus, and it is not passed from one person to another (not contagious). What increases the risk? This condition is more likely to develop in women, usually after menopause. What are the signs or symptoms? Symptoms of this condition include:  Thin, wrinkled, white areas on the skin.  Thickened white areas on the skin.  Red and swollen patches (lesions) on the skin.  Tears or cracks in the skin.  Bruising.  Blood blisters.  Severe itching.  Pain, itching, or burning when urinating. Constipation is also common in people with lichen sclerosus. How is this diagnosed? This condition may be diagnosed with a physical exam. In some cases, a tissue sample (biopsy sample) may be removed to be looked at under a microscope. How is this treated? This condition is usually treated with medicated creams or ointments (topical steroids) that are applied over the affected areas. In some cases, treatment may also include medicines that are taken by mouth. Surgery may be needed in more severe cases that are causing problems such as scarring. Follow these instructions at home:  Take or use over-the-counter and prescription medicines only as told by your health care provider.  Use creams or ointments as told by your health care provider.  Do not scratch the affected areas of skin.  If you are a woman, be sure to keep the vaginal area as clean and dry  as possible.  Clean the affected area of skin gently with water. Avoid using rough towels or toilet paper.  Keep all follow-up visits as told by your health care provider. This is important. Contact a health care provider if:  You have increasing redness, swelling, or pain in the affected area.  You have fluid, blood, or pus coming from the affected area.  You have new lesions on your skin.  You have a fever.  You have pain during sex. Summary  Lichen sclerosus is a skin problem. When the genital area is affected, getting treatment is important because the condition can cause scarring that may lead to other problems.  This condition is usually treated with medicated creams or ointments (topical steroids) that are applied over the affected areas.  Take or use over-the-counter and prescription medicines only as told by your health care provider.  Contact a health care provider if you have new lesions on your skin, have pain during sex, or have increasing redness, swelling, or pain in the affected area.  Keep all follow-up visits as told by your health care provider. This is important. This information is not intended to replace advice given to you by your health care provider. Make sure you discuss any questions you have with your health care provider. Document Released: 10/04/2010 Document Revised: 09/26/2017 Document Reviewed: 09/26/2017 Elsevier Patient Education  2020 [employer]  

## 2019-01-24 LAB — URINALYSIS, MICROSCOPIC ONLY
Bacteria, UA: NONE SEEN /HPF
Hyaline Cast: NONE SEEN /LPF

## 2019-01-26 ENCOUNTER — Encounter: Payer: Self-pay | Admitting: Physician Assistant

## 2019-01-26 DIAGNOSIS — R3129 Other microscopic hematuria: Secondary | ICD-10-CM | POA: Insufficient documentation

## 2019-01-27 ENCOUNTER — Other Ambulatory Visit: Payer: Self-pay | Admitting: Physician Assistant

## 2019-01-27 DIAGNOSIS — R3129 Other microscopic hematuria: Secondary | ICD-10-CM

## 2019-01-28 LAB — HERPES SIMPLEX VIRUS CULTURE
MICRO NUMBER:: 823752
SPECIMEN QUALITY:: ADEQUATE

## 2019-01-28 LAB — URINE CULTURE
MICRO NUMBER:: 823973
Result:: NO GROWTH
SPECIMEN QUALITY:: ADEQUATE

## 2019-02-04 DIAGNOSIS — N760 Acute vaginitis: Secondary | ICD-10-CM | POA: Diagnosis not present

## 2019-02-04 DIAGNOSIS — N9089 Other specified noninflammatory disorders of vulva and perineum: Secondary | ICD-10-CM | POA: Diagnosis not present

## 2019-02-04 DIAGNOSIS — L900 Lichen sclerosus et atrophicus: Secondary | ICD-10-CM | POA: Diagnosis not present

## 2019-02-04 DIAGNOSIS — B9689 Other specified bacterial agents as the cause of diseases classified elsewhere: Secondary | ICD-10-CM | POA: Diagnosis not present

## 2019-02-05 DIAGNOSIS — N9089 Other specified noninflammatory disorders of vulva and perineum: Secondary | ICD-10-CM | POA: Diagnosis not present

## 2019-02-15 ENCOUNTER — Encounter: Payer: Self-pay | Admitting: Physician Assistant

## 2019-03-12 DIAGNOSIS — L292 Pruritus vulvae: Secondary | ICD-10-CM | POA: Diagnosis not present

## 2019-03-12 DIAGNOSIS — N859 Noninflammatory disorder of uterus, unspecified: Secondary | ICD-10-CM | POA: Diagnosis not present

## 2019-03-12 DIAGNOSIS — D251 Intramural leiomyoma of uterus: Secondary | ICD-10-CM | POA: Diagnosis not present

## 2019-03-12 DIAGNOSIS — N83292 Other ovarian cyst, left side: Secondary | ICD-10-CM | POA: Diagnosis not present

## 2019-03-12 DIAGNOSIS — N921 Excessive and frequent menstruation with irregular cycle: Secondary | ICD-10-CM | POA: Diagnosis not present

## 2019-03-12 DIAGNOSIS — L280 Lichen simplex chronicus: Secondary | ICD-10-CM | POA: Diagnosis not present

## 2019-03-12 DIAGNOSIS — N9089 Other specified noninflammatory disorders of vulva and perineum: Secondary | ICD-10-CM | POA: Diagnosis not present

## 2019-04-21 DIAGNOSIS — K626 Ulcer of anus and rectum: Secondary | ICD-10-CM | POA: Diagnosis not present

## 2019-04-21 DIAGNOSIS — N905 Atrophy of vulva: Secondary | ICD-10-CM | POA: Diagnosis not present

## 2019-04-21 DIAGNOSIS — N9089 Other specified noninflammatory disorders of vulva and perineum: Secondary | ICD-10-CM | POA: Diagnosis not present

## 2019-04-21 DIAGNOSIS — L309 Dermatitis, unspecified: Secondary | ICD-10-CM | POA: Diagnosis not present

## 2019-05-12 DIAGNOSIS — Z20828 Contact with and (suspected) exposure to other viral communicable diseases: Secondary | ICD-10-CM | POA: Diagnosis not present

## 2019-05-12 DIAGNOSIS — R0981 Nasal congestion: Secondary | ICD-10-CM | POA: Diagnosis not present

## 2019-05-12 DIAGNOSIS — R05 Cough: Secondary | ICD-10-CM | POA: Diagnosis not present

## 2019-05-12 DIAGNOSIS — R509 Fever, unspecified: Secondary | ICD-10-CM | POA: Diagnosis not present

## 2019-06-25 ENCOUNTER — Encounter: Payer: BC Managed Care – PPO | Admitting: Osteopathic Medicine

## 2019-08-06 ENCOUNTER — Encounter: Payer: Self-pay | Admitting: Osteopathic Medicine

## 2019-08-06 DIAGNOSIS — Z23 Encounter for immunization: Secondary | ICD-10-CM | POA: Diagnosis not present

## 2019-08-27 DIAGNOSIS — Z23 Encounter for immunization: Secondary | ICD-10-CM | POA: Diagnosis not present

## 2019-10-16 DIAGNOSIS — L280 Lichen simplex chronicus: Secondary | ICD-10-CM | POA: Diagnosis not present

## 2020-02-02 ENCOUNTER — Encounter: Payer: Self-pay | Admitting: Osteopathic Medicine

## 2020-11-23 ENCOUNTER — Emergency Department (INDEPENDENT_AMBULATORY_CARE_PROVIDER_SITE_OTHER): Payer: Managed Care, Other (non HMO)

## 2020-11-23 ENCOUNTER — Other Ambulatory Visit: Payer: Self-pay

## 2020-11-23 ENCOUNTER — Emergency Department
Admission: RE | Admit: 2020-11-23 | Discharge: 2020-11-23 | Disposition: A | Discharge status: Home / Self Care | Payer: 59 | Source: Ambulatory Visit

## 2020-11-23 VITALS — BP 119/78 | HR 79 | Temp 97.7°F | Resp 20 | Ht 61.0 in | Wt 208.0 lb

## 2020-11-23 DIAGNOSIS — S39012A Strain of muscle, fascia and tendon of lower back, initial encounter: Secondary | ICD-10-CM | POA: Diagnosis not present

## 2020-11-23 DIAGNOSIS — M62830 Muscle spasm of back: Secondary | ICD-10-CM

## 2020-11-23 DIAGNOSIS — M5440 Lumbago with sciatica, unspecified side: Secondary | ICD-10-CM

## 2020-11-23 IMAGING — DX DG LUMBAR SPINE COMPLETE 4+V
5 series · 5 of 5 positions shown · non-contrast
Comparison: None.

CLINICAL DATA: Low back pain for 4 days

EXAM:
LUMBAR SPINE - COMPLETE 4+ VIEW

[l-spine ap]
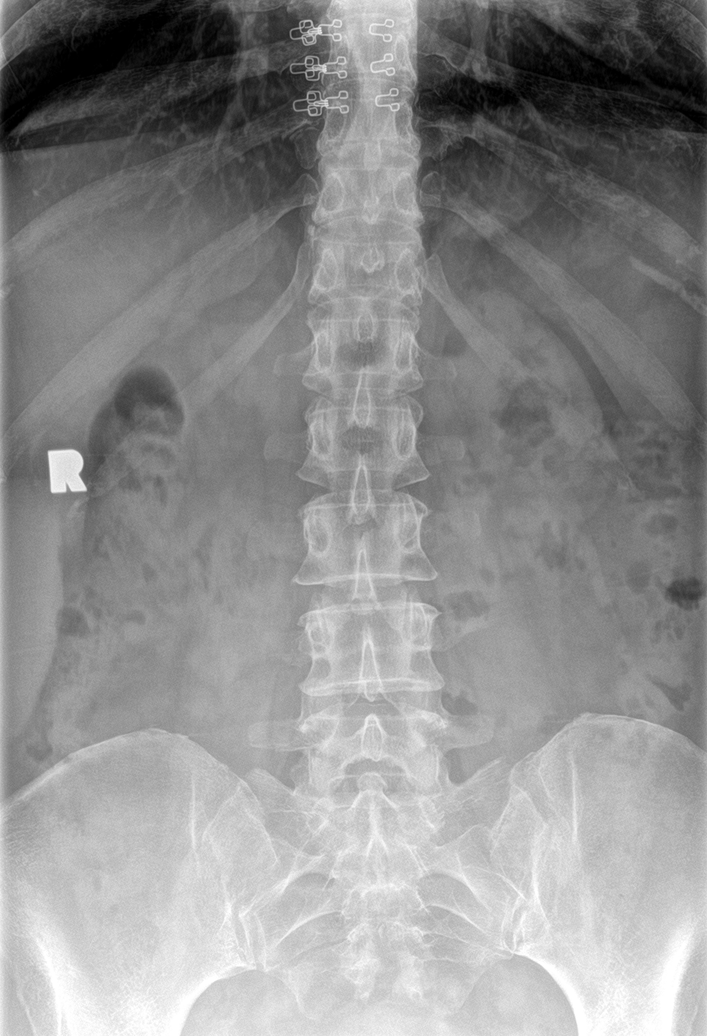

[l-spine obl (1 of 2)]
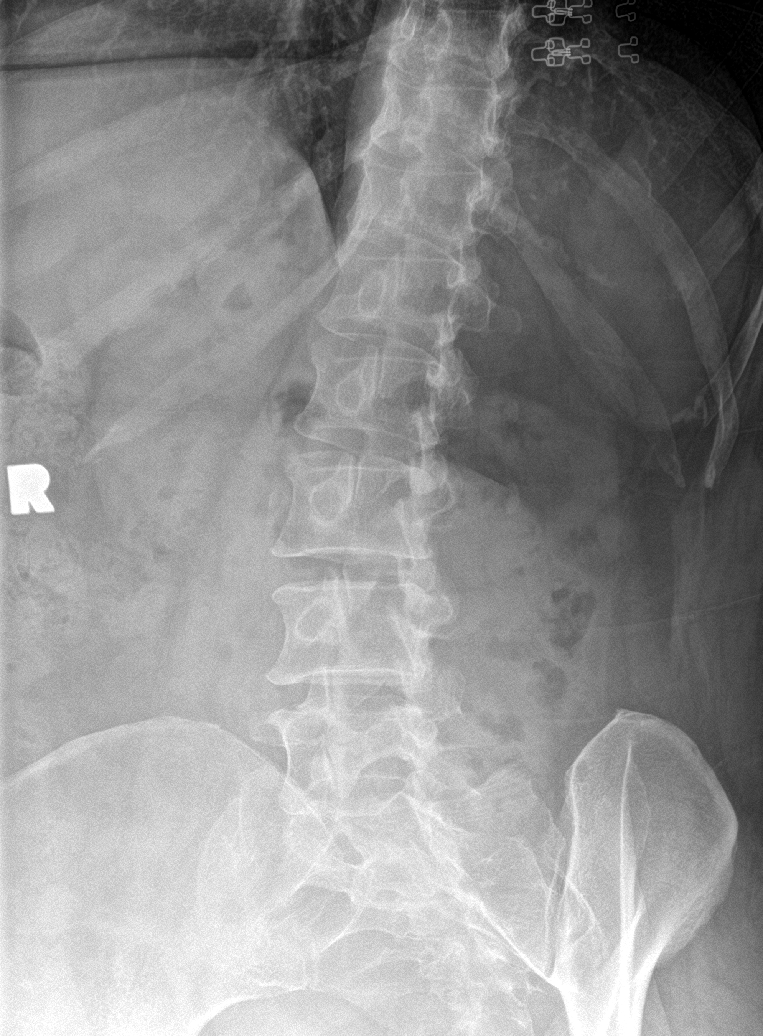

[l-spine obl (2 of 2)]
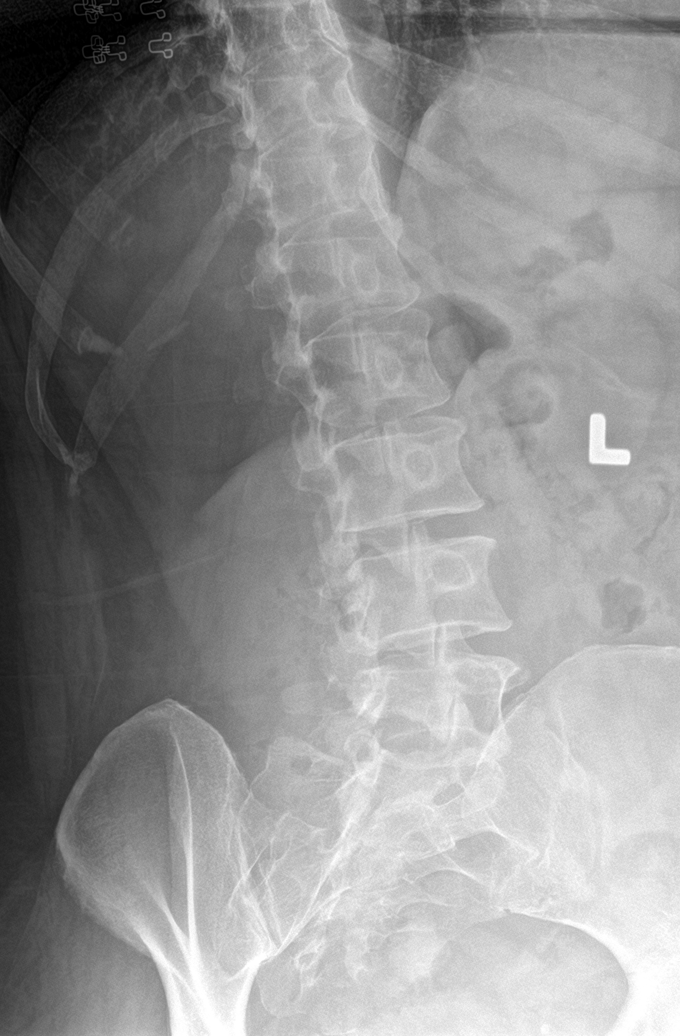

[l-spine lat]
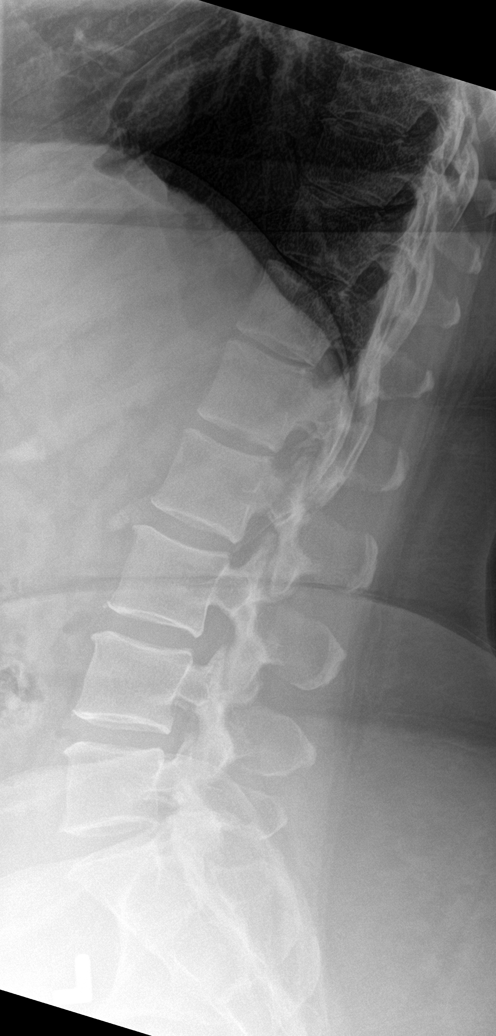

[l-spine spot]
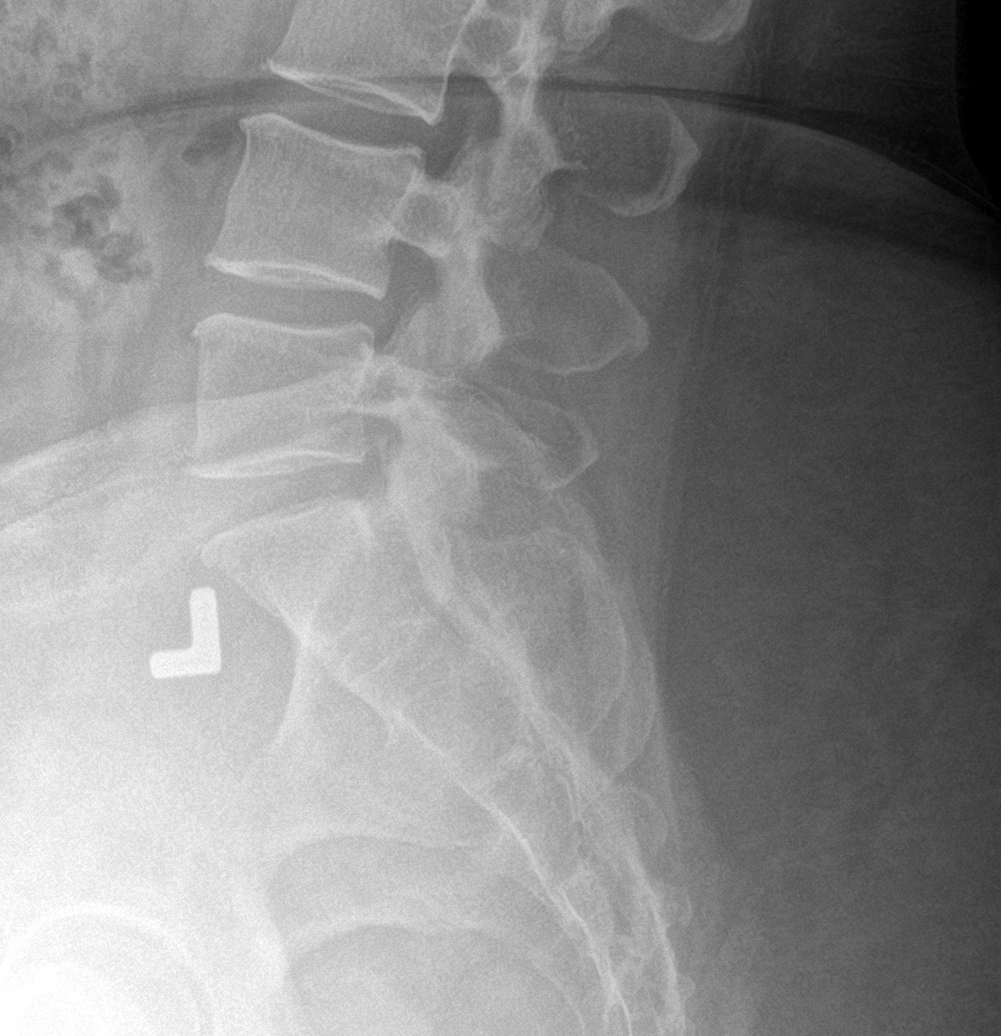

[5 of 5 positions shown; findings below may reference images not displayed]

FINDINGS: Frontal, lateral, spot lumbosacral lateral, and bilateral oblique
views were obtained. There are 5 non-rib-bearing lumbar type
vertebral bodies. No fracture or spondylolisthesis. The disc spaces
appear normal. There is mild facet osteoarthritic change at L5-S1
bilaterally. Facets at other levels appear normal.
IMPRESSION: Facet osteoarthritic change at L5-S1 bilaterally. No appreciable
disc space narrowing. No fracture or spondylolisthesis.

## 2020-11-23 MED ORDER — BACLOFEN 10 MG PO TABS: 10.0000 mg | ORAL_TABLET | Freq: Three times a day (TID) | ORAL | 0 refills | Status: DC

## 2020-11-23 MED ORDER — PREDNISONE 20 MG PO TABS: ORAL_TABLET | ORAL | 0 refills | Status: DC

## 2020-11-23 NOTE — ED Triage Notes (Signed)
Pt presents to Urgent Care with c/o L lower back pain x 4 days. Pt reports pain is constant and is affecting her sleep. Denies known injury and no dysuria.

## 2020-11-23 NOTE — Discharge Instructions (Addendum)
Advised patient to take medication as directed with food to completion.  Advised patient may take baclofen daily, as needed for concurrent back spasms.  Encourage patient to increase daily water intake while taking these medications.

## 2020-11-23 NOTE — ED Provider Notes (Signed)
[facility]    CSN: [id number] Arrival date & time: 11/23/20  5170      History   Chief Complaint Chief Complaint  Patient presents with   Back Pain    L lower    HPI Rebecca Bullock is a 55 y.o. female.   HPI 55 year old female presents with left lower back pain x4 days.  Reports constant pain is affecting her sleep.  Denies injury or insult to the lower back.  Denies dysuria.  History reviewed. No pertinent past medical history.  Patient Active Problem List   Diagnosis Date Noted   Microscopic hematuria 01/26/2019   Perimenopausal 09/25/2016   Insomnia 09/25/2016   Seasonal allergic rhinitis 09/25/2016   Vitamin D deficiency 09/25/2016    History reviewed. No pertinent surgical history.  OB History   No obstetric history on file.      Home Medications    Prior to Admission medications   Medication Sig Start Date End Date Taking? Authorizing Provider  baclofen (LIORESAL) 10 MG tablet Take 1 tablet (10 mg total) by mouth 3 (three) times daily. 11/23/20  Yes Eliezer Lofts, FNP  ibuprofen (ADVIL) 800 MG tablet Take 800 mg by mouth every 8 (eight) hours as needed.   Yes [provider]  predniSONE (DELTASONE) 20 MG tablet Take 3 tabs PO daily x 5 days. 11/23/20  Yes Eliezer Lofts, FNP  clobetasol ointment (TEMOVATE) 0.17 % Apply 1 application topically 2 (two) times daily. Apply to affected area once daily 01/23/19   Trixie Dredge, PA-C  fluticasone [facility]) 50 MCG/ACT nasal spray Place 2 sprays into both nostrils daily. 09/18/16   Emeterio Reeve, DO    Family History Family History  Problem Relation Age of Onset   Cancer Mother    Cancer Father    Diabetes Maternal Grandmother    Alcohol abuse Maternal Uncle     Social History Social History   Tobacco Use   Smoking status: Never   Smokeless tobacco: Never  Vaping Use   Vaping Use: Never used  Substance Use Topics   Alcohol use: Yes    Comment: occasionally   Drug  use: Not Currently     Allergies   Patient has no known allergies.   Review of Systems Review of Systems  Musculoskeletal:  Positive for back pain.  All other systems reviewed and are negative.   Physical Exam Triage Vital Signs ED Triage Vitals  Enc Vitals Group     BP 11/23/20 1000 119/78     Pulse Rate 11/23/20 1000 79     Resp 11/23/20 1000 20     Temp 11/23/20 1000 97.7 F (36.5 C)     Temp Source 11/23/20 1000 Oral     SpO2 11/23/20 1000 100 %     Weight 11/23/20 0955 208 lb (94.3 kg)     Height 11/23/20 0955 5\' 1"  (1.549 m)     Head Circumference --      Peak Flow --      Pain Score 11/23/20 0955 7     Pain Loc --      Pain Edu? --      Excl. in [place]? --    No data found.  Updated Vital Signs BP 119/78   Pulse 79   Temp 97.7 F (36.5 C) (Oral)   Resp 20   Ht 5\' 1"  (1.549 m)   Wt 208 lb (94.3 kg)   SpO2 100%   BMI 39.30 kg/m  Physical Exam Constitutional:      General: She is not in acute distress.    Appearance: Normal appearance. She is obese. She is not ill-appearing.  HENT:     Head: Normocephalic and atraumatic.     Mouth/Throat:     Mouth: Mucous membranes are moist.     Pharynx: Oropharynx is clear.  Eyes:     Extraocular Movements: Extraocular movements intact.     Conjunctiva/sclera: Conjunctivae normal.     Pupils: Pupils are equal, round, and reactive to light.  Cardiovascular:     Rate and Rhythm: Normal rate and regular rhythm.     Pulses: Normal pulses.     Heart sounds: Normal heart sounds.  Pulmonary:     Effort: Pulmonary effort is normal.     Breath sounds: Normal breath sounds.     Comments: No adventitious breath sounds Musculoskeletal:     Cervical back: Normal range of motion and neck supple. No rigidity.     Comments: Lumbar sacral spine (left-sided inferior aspect) TTP over left-sided paraspinous muscles and left-sided spinal erector, palpable muscle adhesions noted, positive left SLR at 30%  Skin:    General:  Skin is warm and dry.  Neurological:     General: No focal deficit present.     Mental Status: She is alert and oriented to person, place, and time.  Psychiatric:        Mood and Affect: Mood normal.        Behavior: Behavior normal.     UC Treatments / Results  Labs (all labs ordered are listed, but only abnormal results are displayed) Labs Reviewed - No data to display  EKG   Radiology DG Lumbar Spine Complete  Result Date: 11/23/2020 CLINICAL DATA:  Low back pain for 4 days EXAM: LUMBAR SPINE - COMPLETE 4+ VIEW COMPARISON:  None. FINDINGS: Frontal, lateral, spot lumbosacral lateral, and bilateral oblique views were obtained. There are 5 non-rib-bearing lumbar type vertebral bodies. No fracture or spondylolisthesis. The disc spaces appear normal. There is mild facet osteoarthritic change at L5-S1 bilaterally. Facets at other levels appear normal. IMPRESSION: Facet osteoarthritic change at L5-S1 bilaterally. No appreciable disc space narrowing. No fracture or spondylolisthesis. Electronically Signed   By: Lowella Grip III M.D.   On: 11/23/2020 11:07    Procedures Procedures (including critical care time)  Medications Ordered in UC Medications - No data to display  Initial Impression / Assessment and Plan / UC Course  I have reviewed the triage vital signs and the nursing notes.  Pertinent labs & imaging results that were available during my care of the patient were reviewed by me and considered in my medical decision making (see chart for details).     MDM: 1.  Acute left-sided low back pain with sciatica-Rx'd Prednisone burst x5 days, 2.  Strain of lumbar region initial encounter, 3. muscle spasms of back-Rx'd Baclofen. Final Clinical Impressions(s) / UC Diagnoses   Final diagnoses:  Strain of lumbar region, initial encounter  Acute left-sided low back pain with sciatica, sciatica laterality unspecified  Muscle spasm of back     Discharge Instructions       Advised patient to take medication as directed with food to completion.  Advised patient may take baclofen daily, as needed for concurrent back spasms.  Encourage patient to increase daily water intake while taking these medications.     ED Prescriptions     Medication Sig Dispense Auth. Provider   predniSONE (DELTASONE) 20 MG tablet  Take 3 tabs PO daily x 5 days. 15 tablet Eliezer Lofts, FNP   baclofen (LIORESAL) 10 MG tablet Take 1 tablet (10 mg total) by mouth 3 (three) times daily. 2 each Eliezer Lofts, FNP      PDMP not reviewed this encounter.   Eliezer Lofts, [place] 11/23/20 1712

## 2021-03-13 ENCOUNTER — Ambulatory Visit (INDEPENDENT_AMBULATORY_CARE_PROVIDER_SITE_OTHER): Payer: 59 | Admitting: Physician Assistant

## 2021-03-13 ENCOUNTER — Encounter: Payer: Self-pay | Admitting: Physician Assistant

## 2021-03-13 ENCOUNTER — Other Ambulatory Visit: Payer: Self-pay

## 2021-03-13 ENCOUNTER — Telehealth (HOSPITAL_BASED_OUTPATIENT_CLINIC_OR_DEPARTMENT_OTHER): Payer: Self-pay

## 2021-03-13 VITALS — BP 135/52 | HR 92 | Temp 98.7°F | Wt 200.1 lb

## 2021-03-13 DIAGNOSIS — R0602 Shortness of breath: Secondary | ICD-10-CM | POA: Diagnosis not present

## 2021-03-13 DIAGNOSIS — D509 Iron deficiency anemia, unspecified: Secondary | ICD-10-CM | POA: Diagnosis not present

## 2021-03-13 DIAGNOSIS — U099 Post covid-19 condition, unspecified: Secondary | ICD-10-CM

## 2021-03-13 DIAGNOSIS — R053 Chronic cough: Secondary | ICD-10-CM | POA: Diagnosis not present

## 2021-03-13 MED ORDER — ALBUTEROL SULFATE HFA 108 (90 BASE) MCG/ACT IN AERS: 2.0000 | INHALATION_SPRAY | Freq: Four times a day (QID) | RESPIRATORY_TRACT | 0 refills | Status: DC | PRN

## 2021-03-13 MED ORDER — MONTELUKAST SODIUM 10 MG PO TABS: 10.0000 mg | ORAL_TABLET | Freq: Every day | ORAL | 3 refills | Status: DC

## 2021-03-13 MED ORDER — PANTOPRAZOLE SODIUM 40 MG PO TBEC: 40.0000 mg | DELAYED_RELEASE_TABLET | Freq: Every day | ORAL | 3 refills | Status: DC

## 2021-03-13 NOTE — Patient Instructions (Addendum)
CT of chest ordered.  Echo ordered.  Start singulair.    If both normal. Come back in for spirometry of lungs.

## 2021-03-13 NOTE — Progress Notes (Signed)
Subjective:    Patient ID: Rebecca Bullock, female    DOB: 01/07/1966, 55 y.o.   MRN: [id number]  HPI Pt is a 55 yo female with seasonal allergies, insomnia who presents to the clinic with chronic cough for over a year.   About 1 year ago she had covid. She did not have insurance so she did the symptomatic things and got better but was still left with dry to productive cough. She was vaccinated. She was able to switch jobs and now she has insurance. She went to UC once for hip pain and mentioned symptoms and was told to take OTC cough drops and suppressants. Sucking on mints help the most. They have not really helped much. She denies any sinus pressure, ear pain, wheezing. She is more SOB with exertion and cough worsens with exertion. No hx of asthma or lung disease. No peripheral edema. No acid reflux symptoms. She is taking some OtC reflux medication with no real benefit.    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   SOB (shortness of breath) 03/13/2021   Resolved Ambulatory Problems    Diagnosis Date Noted   No Resolved Ambulatory Problems   No Additional Past Medical History       Review of Systems See HPI.     Objective:   Physical Exam Vitals reviewed.  Constitutional:      Appearance: Normal appearance.  HENT:     Head: Normocephalic and atraumatic.  Neck:     Vascular: No carotid bruit.  Cardiovascular:     Rate and Rhythm: Normal rate and regular rhythm.     Pulses: Normal pulses.     Heart sounds: Normal heart sounds.  Pulmonary:     Effort: Pulmonary effort is normal.     Breath sounds: Normal breath sounds.  Abdominal:     General: Bowel sounds are normal. There is no distension.     Palpations: Abdomen is soft.     Tenderness: There is no abdominal  tenderness. There is no right CVA tenderness, left CVA tenderness or guarding.  Musculoskeletal:        General: No swelling. Normal range of motion.  Neurological:     General: No focal deficit present.     Mental Status: She is alert and oriented to person, place, and time.  Psychiatric:        Mood and Affect: Mood normal.          Assessment & Plan:  Marland KitchenMarland KitchenTammy was seen today for cough.  Diagnoses and all orders for this visit:  Post-COVID chronic cough -     COMPLETE METABOLIC PANEL WITH GFR -     Brain natriuretic peptide -     CBC with Differential/Platelet -     montelukast (SINGULAIR) 10 MG tablet; Take 1 tablet (10 mg total) by mouth at bedtime. -     albuterol (VENTOLIN HFA) 108 (90 Base) MCG/ACT inhaler; Inhale 2 puffs into the lungs every 6 (six) hours as needed. -     pantoprazole (PROTONIX) 40 MG tablet; Take 1 tablet (40 mg total) by mouth daily. -     ECHOCARDIOGRAM COMPLETE; Future -     Cardiac Stress Test: Informed Consent Details: Physician/Practitioner Attestation; Transcribe to consent form and obtain patient signature  SOB (shortness of  breath) -     EKG 12-Lead -     COMPLETE METABOLIC PANEL WITH GFR -     Brain natriuretic peptide -     CBC with Differential/Platelet -     albuterol (VENTOLIN HFA) 108 (90 Base) MCG/ACT inhaler; Inhale 2 puffs into the lungs every 6 (six) hours as needed. -     ECHOCARDIOGRAM COMPLETE; Future -     Cardiac Stress Test: Informed Consent Details: Physician/Practitioner Attestation; Transcribe to consent form and obtain patient signature  Chronic cough -     CT Chest Wo Contrast; Future -     COMPLETE METABOLIC PANEL WITH GFR -     Brain natriuretic peptide -     CBC with Differential/Platelet -     albuterol (VENTOLIN HFA) 108 (90 Base) MCG/ACT inhaler; Inhale 2 puffs into the lungs every 6 (six) hours as needed. -     pantoprazole (PROTONIX) 40 MG tablet; Take 1 tablet (40 mg total) by mouth daily. -      ECHOCARDIOGRAM COMPLETE; Future -     Cardiac Stress Test: Informed Consent Details: Physician/Practitioner Attestation; Transcribe to consent form and obtain patient signature  Microcytic anemia -     PLATELET ESTIMATION -     CBC MORPHOLOGY  Concerned with SOB with exertion and post covid.  CT of chest ordered.  BNP, CBC, CMP ordered.  Echo ordered.  Start protonix and singulair for the next 4 weeks and then follow up.  Reassured lungs sound great today.  Pulse ox 100 percent.  Vitals look good.   EKG: NSR, no arrhthymias, no ST elevation or depression.

## 2021-03-14 ENCOUNTER — Other Ambulatory Visit: Payer: Self-pay

## 2021-03-14 ENCOUNTER — Encounter (HOSPITAL_BASED_OUTPATIENT_CLINIC_OR_DEPARTMENT_OTHER): Payer: Self-pay | Admitting: *Deleted

## 2021-03-14 ENCOUNTER — Observation Stay (HOSPITAL_BASED_OUTPATIENT_CLINIC_OR_DEPARTMENT_OTHER)
Admission: EM | Admit: 2021-03-14 | Discharge: 2021-03-15 | Disposition: A | Discharge status: Home / Self Care | Payer: 59 | Attending: Internal Medicine | Admitting: Internal Medicine

## 2021-03-14 ENCOUNTER — Emergency Department (HOSPITAL_BASED_OUTPATIENT_CLINIC_OR_DEPARTMENT_OTHER): Payer: 59

## 2021-03-14 ENCOUNTER — Encounter: Payer: Self-pay | Admitting: Physician Assistant

## 2021-03-14 DIAGNOSIS — D649 Anemia, unspecified: Principal | ICD-10-CM | POA: Diagnosis present

## 2021-03-14 DIAGNOSIS — Z20822 Contact with and (suspected) exposure to covid-19: Secondary | ICD-10-CM | POA: Diagnosis not present

## 2021-03-14 DIAGNOSIS — U099 Post covid-19 condition, unspecified: Secondary | ICD-10-CM | POA: Insufficient documentation

## 2021-03-14 DIAGNOSIS — Z79899 Other long term (current) drug therapy: Secondary | ICD-10-CM | POA: Diagnosis not present

## 2021-03-14 DIAGNOSIS — R053 Chronic cough: Secondary | ICD-10-CM | POA: Diagnosis present

## 2021-03-14 DIAGNOSIS — D509 Iron deficiency anemia, unspecified: Secondary | ICD-10-CM | POA: Insufficient documentation

## 2021-03-14 LAB — CBC WITH DIFFERENTIAL/PLATELET
Abs Immature Granulocytes: 0.03 10*3/uL (ref 0.00–0.07)
Absolute Monocytes: 700 cells/uL (ref 200–950)
Basophils Absolute: 33 cells/uL (ref 0–200)
Basophils Absolute: 0 10*3/uL (ref 0.0–0.1)
Basophils Relative: 0.5 %
Basophils Relative: 0 %
Eosinophils Absolute: 172 cells/uL (ref 15–500)
Eosinophils Absolute: 0.1 10*3/uL (ref 0.0–0.5)
Eosinophils Relative: 2.6 %
Eosinophils Relative: 3 %
HCT: 21.7 % — ABNORMAL LOW (ref 35.0–45.0)
HCT: 21.6 % — ABNORMAL LOW (ref 36.0–46.0)
Hemoglobin: 5.3 g/dL — CL (ref 11.7–15.5)
Hemoglobin: 5.4 g/dL — CL (ref 12.0–15.0)
Immature Granulocytes: 1 %
Lymphocytes Relative: 27 %
Lymphs Abs: 1445 cells/uL (ref 850–3900)
Lymphs Abs: 1.4 10*3/uL (ref 0.7–4.0)
MCH: <15 pg — ABNORMAL LOW (ref 27.0–33.0)
MCH: 14.3 pg — ABNORMAL LOW (ref 26.0–34.0)
MCHC: 24.4 g/dL — ABNORMAL LOW (ref 32.0–36.0)
MCHC: 25 g/dL — ABNORMAL LOW (ref 30.0–36.0)
MCV: 57.9 fL — ABNORMAL LOW (ref 80.0–100.0)
MCV: 57.3 fL — ABNORMAL LOW (ref 80.0–100.0)
Monocytes Absolute: 0.7 10*3/uL (ref 0.1–1.0)
Monocytes Relative: 10.6 %
Monocytes Relative: 13 %
Neutro Abs: 4250 cells/uL (ref 1500–7800)
Neutro Abs: 2.9 10*3/uL (ref 1.7–7.7)
Neutrophils Relative %: 64.4 %
Neutrophils Relative %: 56 %
Platelets: 312 10*3/uL (ref 140–400)
Platelets: 302 10*3/uL (ref 150–400)
RBC: 3.75 10*6/uL — ABNORMAL LOW (ref 3.80–5.10)
RBC: 3.77 MIL/uL — ABNORMAL LOW (ref 3.87–5.11)
RDW: 19.7 % — ABNORMAL HIGH (ref 11.0–15.0)
RDW: 21.1 % — ABNORMAL HIGH (ref 11.5–15.5)
Total Lymphocyte: 21.9 %
WBC: 6.6 10*3/uL (ref 3.8–10.8)
WBC: 5.1 10*3/uL (ref 4.0–10.5)
nRBC: 0 % (ref 0.0–0.2)

## 2021-03-14 LAB — RESP PANEL BY RT-PCR (FLU A&B, COVID) ARPGX2
Influenza A by PCR: NEGATIVE
Influenza B by PCR: NEGATIVE
SARS Coronavirus 2 by RT PCR: NEGATIVE

## 2021-03-14 LAB — COMPLETE METABOLIC PANEL WITH GFR
AG Ratio: 1.4 (calc) (ref 1.0–2.5)
ALT: 10 U/L (ref 6–29)
AST: 15 U/L (ref 10–35)
Albumin: 4.2 g/dL (ref 3.6–5.1)
Alkaline phosphatase (APISO): 102 U/L (ref 37–153)
BUN: 7 mg/dL (ref 7–25)
CO2: 26 mmol/L (ref 20–32)
Calcium: 8.8 mg/dL (ref 8.6–10.4)
Chloride: 103 mmol/L (ref 98–110)
Creat: 0.57 mg/dL (ref 0.50–1.03)
Globulin: 2.9 g/dL (calc) (ref 1.9–3.7)
Glucose, Bld: 98 mg/dL (ref 65–99)
Potassium: 3.9 mmol/L (ref 3.5–5.3)
Sodium: 138 mmol/L (ref 135–146)
Total Bilirubin: 0.3 mg/dL (ref 0.2–1.2)
Total Protein: 7.1 g/dL (ref 6.1–8.1)
eGFR: 109 mL/min/{1.73_m2} (ref >=60)

## 2021-03-14 LAB — COMPREHENSIVE METABOLIC PANEL
ALT: 15 U/L (ref 0–44)
AST: 21 U/L (ref 15–41)
Albumin: 3.7 g/dL (ref 3.5–5.0)
Alkaline Phosphatase: 104 U/L (ref 38–126)
Anion gap: 8 (ref 5–15)
BUN: 5 mg/dL — ABNORMAL LOW (ref 6–20)
CO2: 24 mmol/L (ref 22–32)
Calcium: 8.6 mg/dL — ABNORMAL LOW (ref 8.9–10.3)
Chloride: 103 mmol/L (ref 98–111)
Creatinine, Ser: 0.62 mg/dL (ref 0.44–1.00)
GFR, Estimated: >60 mL/min (ref >60)
Glucose, Bld: 101 mg/dL — ABNORMAL HIGH (ref 70–99)
Potassium: 3.5 mmol/L (ref 3.5–5.1)
Sodium: 135 mmol/L (ref 135–145)
Total Bilirubin: 0.2 mg/dL — ABNORMAL LOW (ref 0.3–1.2)
Total Protein: 7.3 g/dL (ref 6.5–8.1)

## 2021-03-14 LAB — RETICULOCYTES
Immature Retic Fract: 24.7 % — ABNORMAL HIGH (ref 2.3–15.9)
RBC.: 3.47 MIL/uL — ABNORMAL LOW (ref 3.87–5.11)
Retic Count, Absolute: 59.7 10*3/uL (ref 19.0–186.0)
Retic Ct Pct: 1.7 % (ref 0.4–3.1)

## 2021-03-14 LAB — IRON AND TIBC
Iron: 11 ug/dL — ABNORMAL LOW (ref 28–170)
Saturation Ratios: 2 % — ABNORMAL LOW (ref 10.4–31.8)
TIBC: 495 ug/dL — ABNORMAL HIGH (ref 250–450)
UIBC: 484 ug/dL

## 2021-03-14 LAB — VITAMIN B12: Vitamin B-12: 130 pg/mL — ABNORMAL LOW (ref 180–914)

## 2021-03-14 LAB — FOLATE: Folate: 12.3 ng/mL (ref >5.9)

## 2021-03-14 LAB — PREPARE RBC (CROSSMATCH)

## 2021-03-14 LAB — OCCULT BLOOD X 1 CARD TO LAB, STOOL: Fecal Occult Bld: NEGATIVE

## 2021-03-14 LAB — ABO/RH: ABO/RH(D): A POS

## 2021-03-14 LAB — BRAIN NATRIURETIC PEPTIDE: Brain Natriuretic Peptide: 27 pg/mL (ref <100)

## 2021-03-14 LAB — FERRITIN: Ferritin: 2 ng/mL — ABNORMAL LOW (ref 11–307)

## 2021-03-14 IMAGING — DX DG CHEST 2V
2 series · 2 of 2 positions shown · non-contrast
Comparison: None.

CLINICAL DATA: Weakness.

EXAM:
CHEST - 2 VIEW

[chest pa]
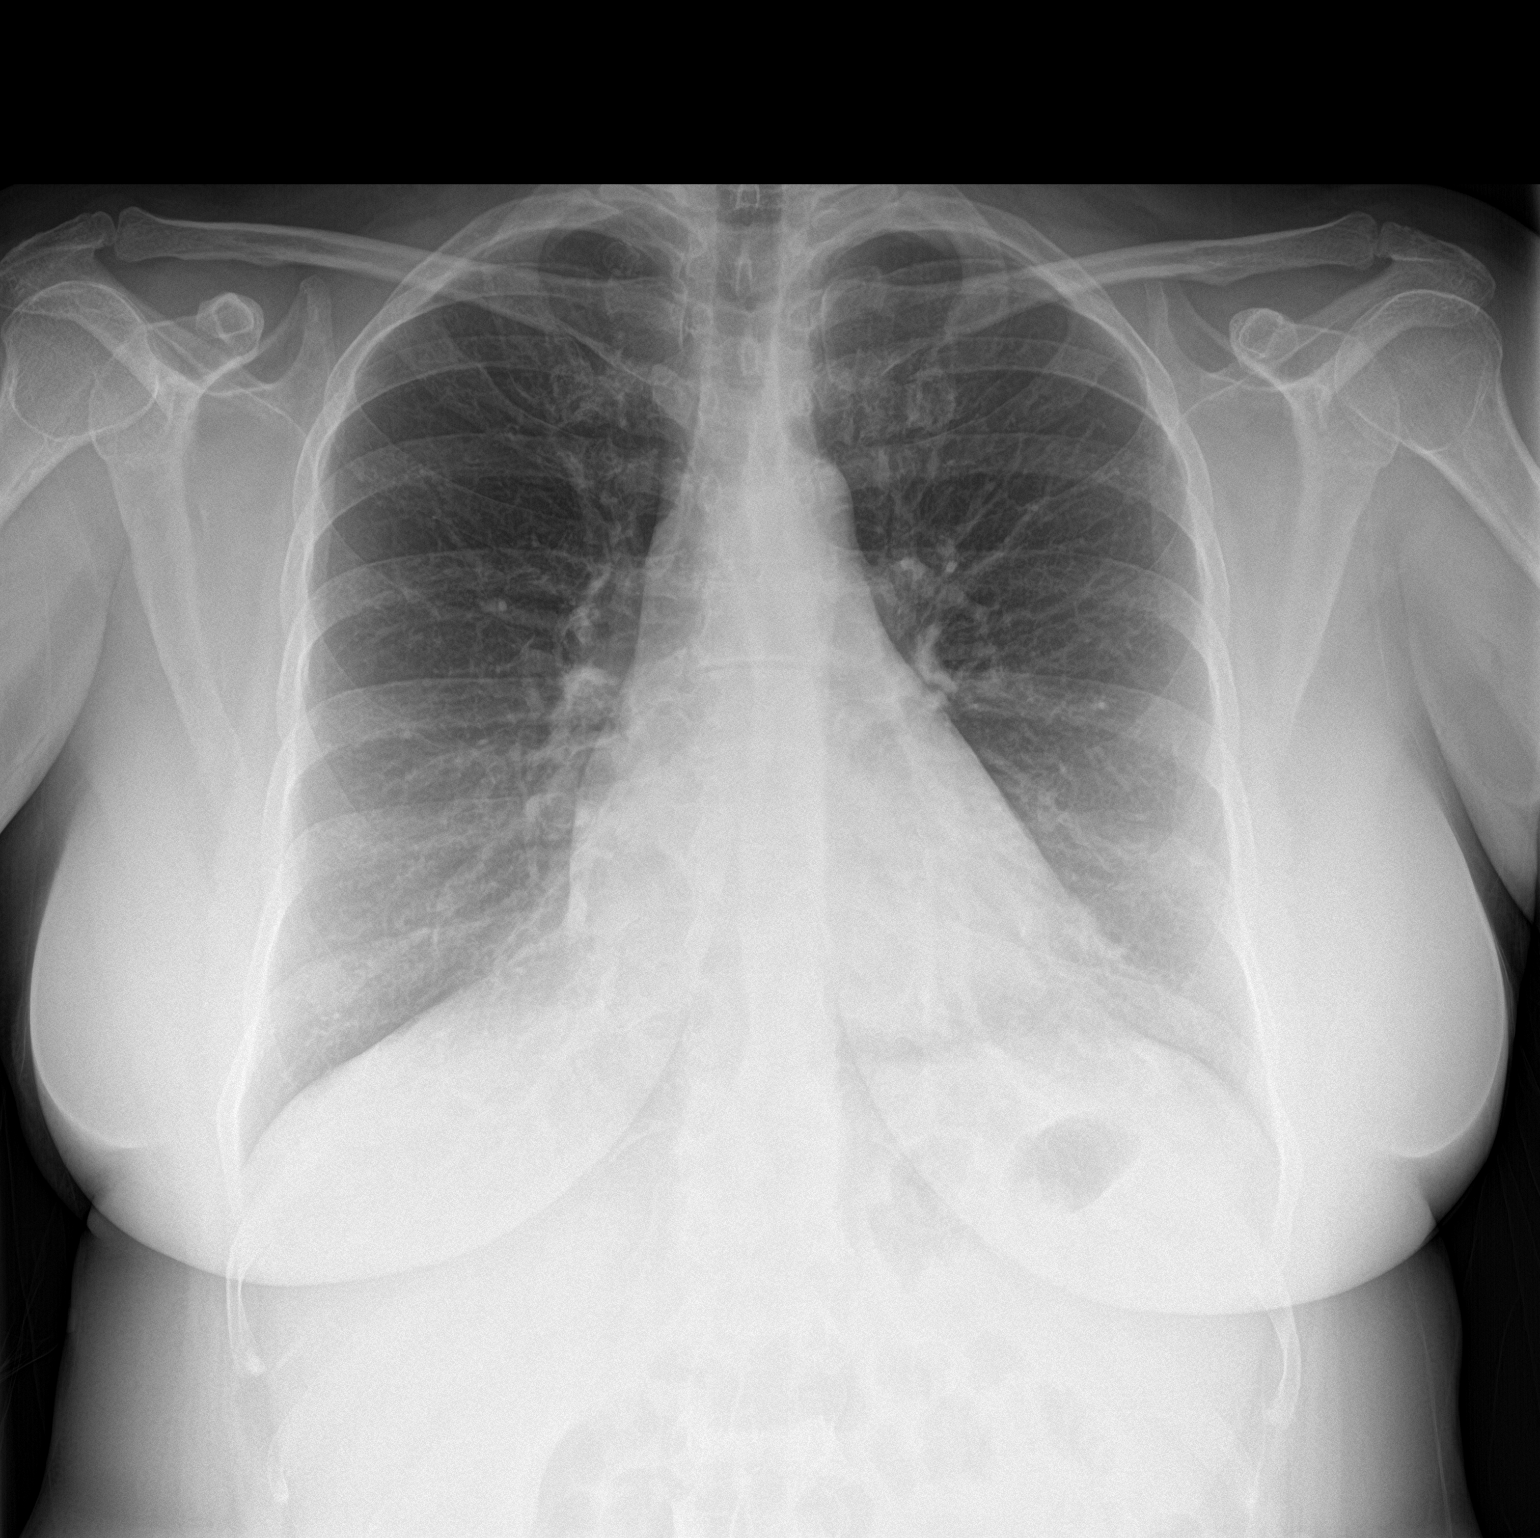

[chest lat]
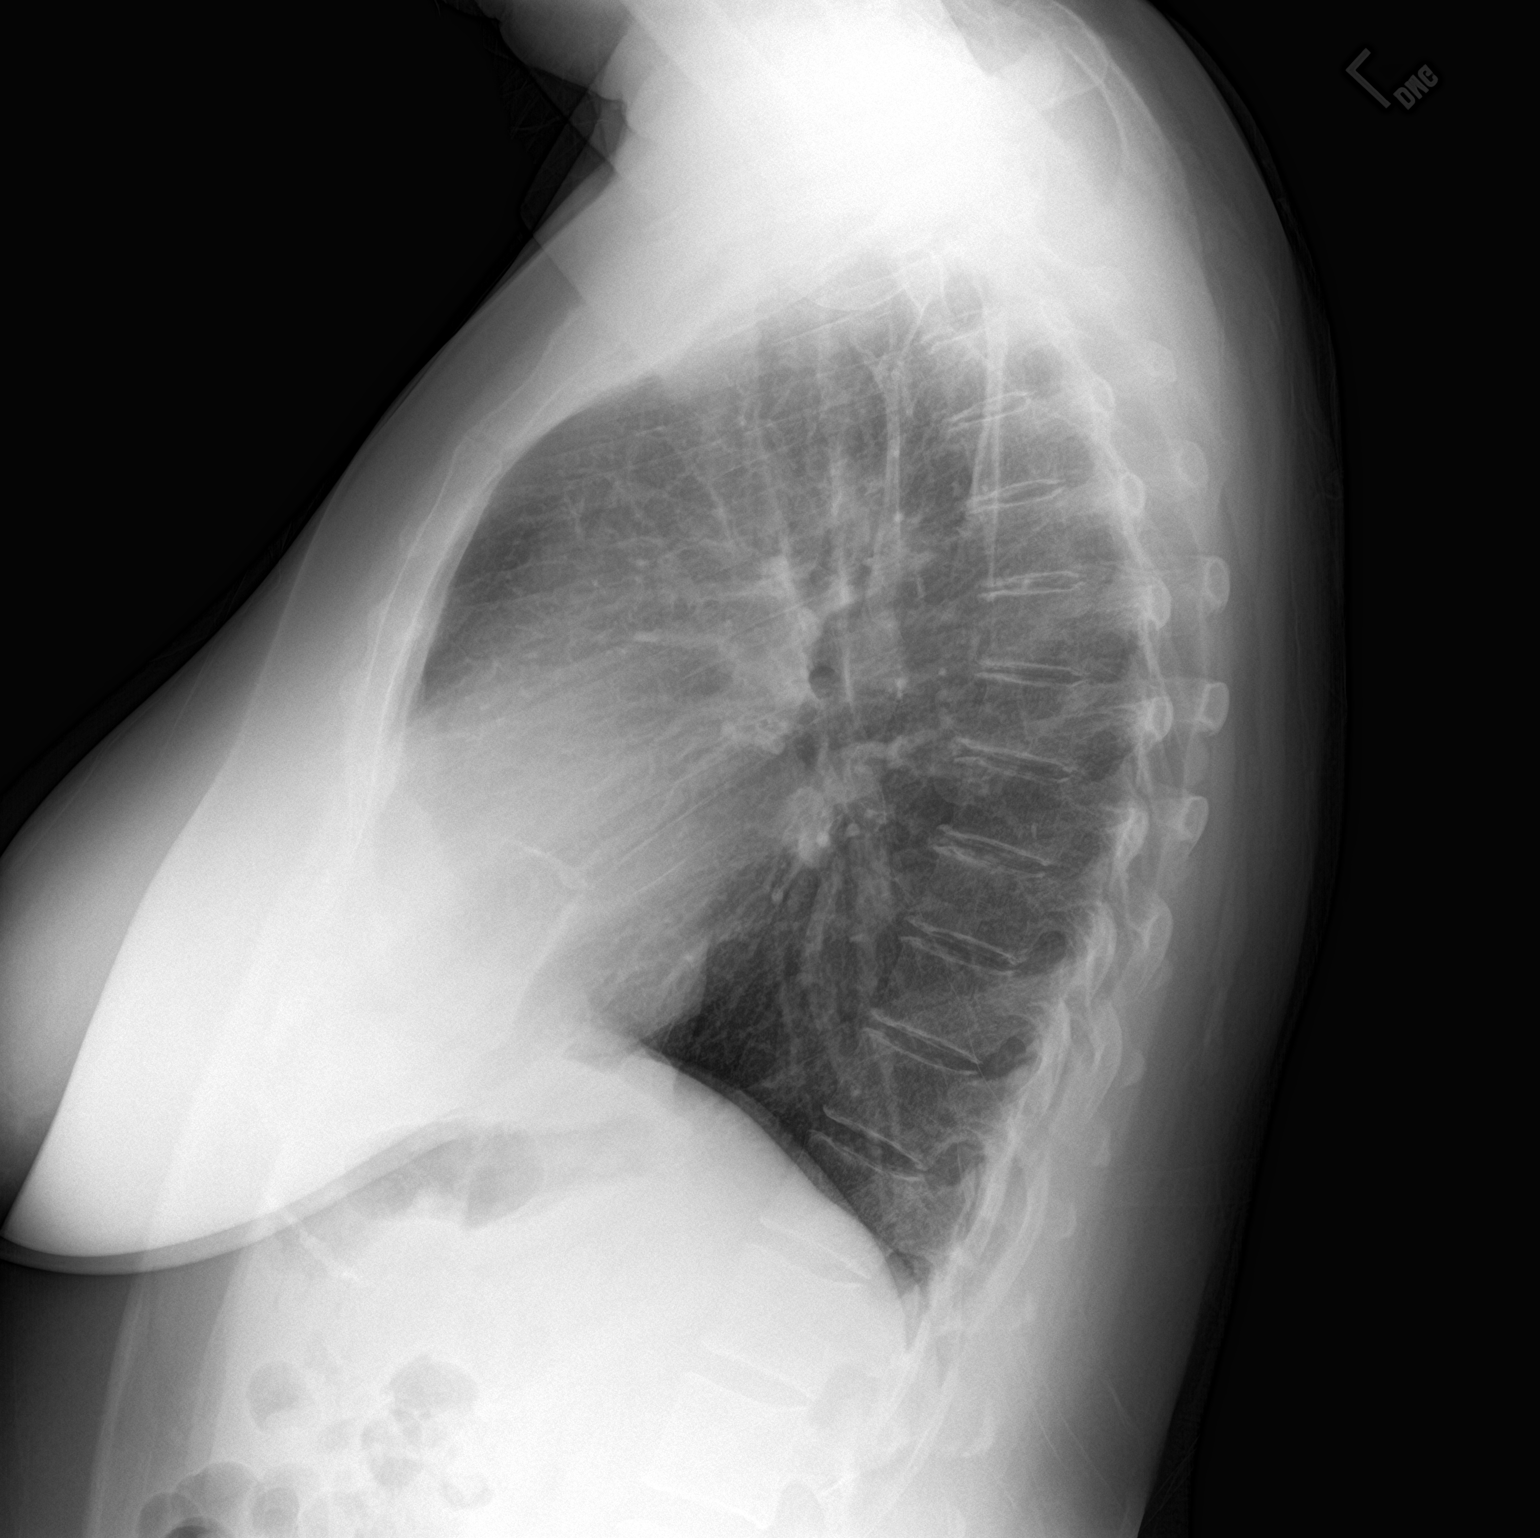

[2 of 2 positions shown; findings below may reference images not displayed]

FINDINGS: The heart size and mediastinal contours are within normal limits.
Both lungs are clear. The visualized skeletal structures are
unremarkable.
IMPRESSION: No active cardiopulmonary disease.

## 2021-03-14 MED ORDER — CYANOCOBALAMIN 1000 MCG/ML IJ SOLN
1000.0000 ug | Freq: Every day | INTRAMUSCULAR | Status: DC
Start: 2021-03-15 — End: 2021-03-15
  Administered 2021-03-15 (×2): 1000 ug via INTRAMUSCULAR
  Filled 2021-03-14 (×2): qty 1

## 2021-03-14 MED ORDER — SODIUM CHLORIDE 0.9% IV SOLUTION: Freq: Once | INTRAVENOUS | Status: AC

## 2021-03-14 NOTE — H&P (Signed)
History and Physical    Rebecca Bullock SWF:[id number] DOB: 1965-10-09 DOA: 03/14/2021  PCP: Donella Stade, PA-C  Patient coming from: Home.  Chief Complaint: Fatigue weakness and cough.  HPI: Rebecca Bullock is a 55 y.o. female with no significant past medical history who has been having persistent cough since patient had COVID infection in October 2021.  Over the last few weeks patient has been having increasing fatigue and weakness and decided to follow-up with her primary care physician who did blood tests and found patient's hemoglobin was low.  Patient denies any blood in the stools and has had prior uterine ablation 4 years ago.  ED Course: In the ER patient's labs show hemoglobin of 5.4 with anemia panel showing ferritin of 2 and vitamin B12 level of 130.  Patient has given consent for blood transfusion 2 units of PRBC has been ordered.  Fecal occult blood is negative.  Review of Systems: As per HPI, rest all negative.   History reviewed. No pertinent past medical history.  Past Surgical History:  Procedure Laterality Date   uterine ablation       reports that she has never smoked. She has never used smokeless tobacco. She reports current alcohol use. She reports that she does not currently use drugs.  Allergies  Allergen Reactions   Metronidazole     Other reaction(s): GI Upset (intolerance)    Family History  Problem Relation Age of Onset   Cancer Mother    Cancer Father    Diabetes Maternal Grandmother    Alcohol abuse Maternal Uncle     Prior to Admission medications   Medication Sig Start Date End Date Taking? Authorizing Provider  albuterol (VENTOLIN HFA) 108 (90 Base) MCG/ACT inhaler Inhale 2 puffs into the lungs every 6 (six) hours as needed. Patient taking differently: Inhale 2 puffs into the lungs every 6 (six) hours as needed for shortness of breath or wheezing. 03/13/21  Yes Breeback, Jade L, PA-C  fluticasone (FLONASE) 50 MCG/ACT nasal spray Place 2  sprays into both nostrils daily. 09/18/16  Yes Emeterio Reeve, DO  ibuprofen (ADVIL) 800 MG tablet Take 800 mg by mouth every 8 (eight) hours as needed for mild pain.   Yes [provider]  montelukast (SINGULAIR) 10 MG tablet Take 1 tablet (10 mg total) by mouth at bedtime. 03/13/21  Yes Breeback, Jade L, PA-C  baclofen (LIORESAL) 10 MG tablet Take 1 tablet (10 mg total) by mouth 3 (three) times daily. Patient not taking: No sig reported 11/23/20   Eliezer Lofts, FNP  betamethasone valerate (VALISONE) 0.1 % cream SMARTSIG:1 Application Topical Every Other Day PRN 11/11/20   [provider]  clobetasol ointment (TEMOVATE) 2.20 % Apply 1 application topically 2 (two) times daily. Apply to affected area once daily Patient not taking: No sig reported 01/23/19   Trixie Dredge, PA-C  estradiol (ESTRACE) 0.1 MG/GM vaginal cream SMARTSIG:Topical 11/11/20   [provider]  pantoprazole (PROTONIX) 40 MG tablet Take 1 tablet (40 mg total) by mouth daily. 03/13/21   Breeback, Jade L, PA-C  predniSONE (DELTASONE) 20 MG tablet Take 3 tabs PO daily x 5 days. Patient not taking: No sig reported 11/23/20   Eliezer Lofts, FNP    Physical Exam: Constitutional: Moderately built and nourished. Vitals:   03/14/21 1415 03/14/21 1500 03/14/21 1844 03/14/21 2148  BP: 119/66 106/65 121/71 (!) 109/55  Pulse: 91 82 93 (!) 102  Resp: 18 18 20 16   Temp:   98.2 F (  36.8 C) 98.2 F (36.8 C)  TempSrc:   Oral   SpO2: 99% 100% 100% 100%  Weight:      Height:       Eyes: Anicteric mild pallor. ENMT: No discharge from the ears eyes nose and mouth: Neck: No mass felt.  No neck rigidity. Respiratory: No rhonchi or crepitations. Cardiovascular: S1-S2 heard. Abdomen: Soft nontender bowel sound present. Musculoskeletal: No edema. Skin: No rash. Neurologic: Alert awake oriented to time place and person.  Moves all extremities. Psychiatric: Appears normal.  Normal  affect.   Labs on Admission: I have personally reviewed following labs and imaging studies  CBC: Recent Labs  Lab 03/13/21 0000 03/14/21 1250  WBC 6.6 5.1  NEUTROABS 4,250 2.9  HGB 5.3* 5.4*  HCT 21.7* 21.6*  MCV 57.9* 57.3*  PLT 312 761   Basic Metabolic Panel: Recent Labs  Lab 03/13/21 0000 03/14/21 1250  NA 138 135  K 3.9 3.5  CL 103 103  CO2 26 24  GLUCOSE 98 101*  BUN 7 5*  CREATININE 0.57 0.62  CALCIUM 8.8 8.6*   GFR: Estimated Creatinine Clearance: 83.5 mL/min (by C-G formula based on SCr of 0.62 mg/dL). Liver Function Tests: Recent Labs  Lab 03/13/21 0000 03/14/21 1250  AST 15 21  ALT 10 15  ALKPHOS  --  104  BILITOT 0.3 0.2*  PROT 7.1 7.3  ALBUMIN  --  3.7   No results for input(s): LIPASE, AMYLASE in the last 168 hours. No results for input(s): AMMONIA in the last 168 hours. Coagulation Profile: No results for input(s): INR, PROTIME in the last 168 hours. Cardiac Enzymes: No results for input(s): CKTOTAL, CKMB, CKMBINDEX, TROPONINI in the last 168 hours. BNP (last 3 results) No results for input(s): PROBNP in the last 8760 hours. HbA1C: No results for input(s): HGBA1C in the last 72 hours. CBG: No results for input(s): GLUCAP in the last 168 hours. Lipid Profile: No results for input(s): CHOL, HDL, LDLCALC, TRIG, CHOLHDL, LDLDIRECT in the last 72 hours. Thyroid Function Tests: No results for input(s): TSH, T4TOTAL, FREET4, T3FREE, THYROIDAB in the last 72 hours. Anemia Panel: Recent Labs    03/14/21 1916 03/14/21 2054  VITAMINB12  --  130*  FOLATE  --  12.3  FERRITIN 2*  --   TIBC 495*  --   IRON 11*  --   RETICCTPCT  --  1.7   Urine analysis:    Component Value Date/Time   BILIRUBINUR negative 01/23/2019 1525   PROTEINUR Negative 01/23/2019 1525   UROBILINOGEN 0.2 01/23/2019 1525   NITRITE negative 01/23/2019 1525   LEUKOCYTESUR Trace (A) 01/23/2019 1525   Sepsis Labs: @LABRCNTIP (procalcitonin:4,lacticidven:4) ) Recent  Results (from the past 240 hour(s))  Resp Panel by RT-PCR (Flu A&B, Covid) Nasopharyngeal Swab     Status: None   Collection Time: 03/14/21  3:12 PM   Specimen: Nasopharyngeal Swab; Nasopharyngeal(NP) swabs in vial transport medium  Result Value Ref Range Status   SARS Coronavirus 2 by RT PCR NEGATIVE NEGATIVE Final    Comment: (NOTE) SARS-CoV-2 target nucleic acids are NOT DETECTED.  The SARS-CoV-2 RNA is generally detectable in upper respiratory specimens during the acute phase of infection. The lowest concentration of SARS-CoV-2 viral copies this assay can detect is 138 copies/mL. A negative result does not preclude SARS-Cov-2 infection and should not be used as the sole basis for treatment or other patient management decisions. A negative result may occur with  improper specimen collection/handling, submission of specimen other than  nasopharyngeal swab, presence of viral mutation(s) within the areas targeted by this assay, and inadequate number of viral copies(<138 copies/mL). A negative result must be combined with clinical observations, patient history, and epidemiological information. The expected result is Negative.  Fact Sheet for Patients:  EntrepreneurPulse.com.au  Fact Sheet for Healthcare Providers:  IncredibleEmployment.be  This test is no t yet approved or cleared by the [place] FDA and  has been authorized for detection and/or diagnosis of SARS-CoV-2 by FDA under an Emergency Use Authorization (EUA). This EUA will remain  in effect (meaning this test can be used) for the duration of the COVID-19 declaration under Section 564(b)(1) of the Act, 21 U.S.C.section 360bbb-3(b)(1), unless the authorization is terminated  or revoked sooner.       Influenza A by PCR NEGATIVE NEGATIVE Final   Influenza B by PCR NEGATIVE NEGATIVE Final    Comment: (NOTE) The Xpert Xpress SARS-CoV-2/FLU/RSV plus assay is intended as an aid in the  diagnosis of influenza from Nasopharyngeal swab specimens and should not be used as a sole basis for treatment. Nasal washings and aspirates are unacceptable for Xpert Xpress SARS-CoV-2/FLU/RSV testing.  Fact Sheet for Patients: EntrepreneurPulse.com.au  Fact Sheet for Healthcare Providers: IncredibleEmployment.be  This test is not yet approved or cleared by the [place] FDA and has been authorized for detection and/or diagnosis of SARS-CoV-2 by FDA under an Emergency Use Authorization (EUA). This EUA will remain in effect (meaning this test can be used) for the duration of the COVID-19 declaration under Section 564(b)(1) of the Act, 21 U.S.C. section 360bbb-3(b)(1), unless the authorization is terminated or revoked.  Performed at [facility], [address] [place], [place] [address]      Radiological Exams on Admission: DG Chest 2 View  Result Date: 03/14/2021 CLINICAL DATA:  Weakness. EXAM: CHEST - 2 VIEW COMPARISON:  None. FINDINGS: The heart size and mediastinal contours are within normal limits. Both lungs are clear. The visualized skeletal structures are unremarkable. IMPRESSION: No active cardiopulmonary disease. Electronically Signed   By: Ronney Asters M.D.   On: 03/14/2021 16:15      Assessment/Plan Principal Problem:   Symptomatic anemia Active Problems:   Post-COVID chronic cough    Symptomatic anemia for which patient has consented for appearance of PRBC transfusion. Anemia secondary to iron deficiency and B12 deficiency.  I have ordered vitamin B12 intramuscularly.  Patient will need iron supplements after transfusion.  Since patient has both B12 and iron deficiency and also chart shows prior vitamin D deficiencies.  We will check celiac disease antibodies for which I have ordered IgA tissue transglutaminase antibody.  Also will check intrinsic factor antibodies.  Patient has never had a colonoscopy or EGD  will need GI referral. Post COVID persistent cough.  Patient is taking montelukast.  Chest x-ray is unremarkable.   DVT prophylaxis: SCDs.  Avoiding anticoagulation in setting of severe anemia. Code Status: Full code. Family Communication: Patient husband at the bedside. Disposition Plan: Home. Consults called: None. Admission status: Observation.   Rise Patience MD [facility] Pager [phone].  If 7PM-7AM, please contact night-coverage [email or web address] Password [email or web address]  03/14/2021, 11:28 PM

## 2021-03-14 NOTE — Progress Notes (Signed)
Bresha,   Some labs pending but kidney, liver, glucose look great.

## 2021-03-14 NOTE — ED Triage Notes (Signed)
Her hemoglobin is 5.3. she was seen by her MD yesterday for a cough that she has had for a year with fatigue.

## 2021-03-14 NOTE — Progress Notes (Signed)
Rebecca Bullock,   Your hemoglobin came back as 5.3 VERY/dangerously low. You need to go to hospital/ED for blood transfusion and to look for any sources of bleeding. Are you having any dark/tarry/sticky stools? [facility] in [facility] so they can see labs drawn.

## 2021-03-14 NOTE — ED Provider Notes (Signed)
[facility] EMERGENCY DEPARTMENT Provider Note   CSN: [id number] Arrival date & time: 03/14/21  1152     History No chief complaint on file.   Rebecca Bullock is a 55 y.o. female who presents after abnormal lab at primary care.  Patient states she was being seen at PCP for post-COVID chronic cough, was called that her hemoglobin was 5.3 and was told to be seen in the emergency department. She reports chronic fatigue, but had associated this with life stress and entering menopause. Reports dyspnea on exertion. No fevers, chills, headaches, lightheadedness, syncope, chest pain, dark or tarry stools, vaginal bleeding.   HPI     History reviewed. No pertinent past medical history.  Patient Active Problem List   Diagnosis Date Noted   Microcytic anemia 03/14/2021   Symptomatic anemia 03/14/2021   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   SOB (shortness of breath) 03/13/2021   Microscopic hematuria 01/26/2019   Perimenopausal 09/25/2016   Insomnia 09/25/2016   Seasonal allergic rhinitis 09/25/2016   Vitamin D deficiency 09/25/2016    History reviewed. No pertinent surgical history.   OB History   No obstetric history on file.     Family History  Problem Relation Age of Onset   Cancer Mother    Cancer Father    Diabetes Maternal Grandmother    Alcohol abuse Maternal Uncle     Social History   Tobacco Use   Smoking status: Never   Smokeless tobacco: Never  Vaping Use   Vaping Use: Never used  Substance Use Topics   Alcohol use: Yes    Comment: occasionally   Drug use: Not Currently    Home Medications Prior to Admission medications   Medication Sig Start Date End Date Taking? Authorizing Provider  albuterol (VENTOLIN HFA) 108 (90 Base) MCG/ACT inhaler Inhale 2 puffs into the lungs every 6 (six) hours as needed. 03/13/21  Yes Breeback, Jade L, PA-C  baclofen (LIORESAL) 10 MG tablet Take 1 tablet (10 mg total) by mouth 3 (three) times daily. 11/23/20  Yes  Eliezer Lofts, FNP  clobetasol ointment (TEMOVATE) 0.97 % Apply 1 application topically 2 (two) times daily. Apply to affected area once daily 01/23/19  Yes Trixie Dredge, PA-C  fluticasone [place]) 50 MCG/ACT nasal spray Place 2 sprays into both nostrils daily. 09/18/16  Yes Emeterio Reeve, DO  ibuprofen (ADVIL) 800 MG tablet Take 800 mg by mouth every 8 (eight) hours as needed.   Yes [provider]  montelukast (SINGULAIR) 10 MG tablet Take 1 tablet (10 mg total) by mouth at bedtime. 03/13/21  Yes Breeback, Jade L, PA-C  pantoprazole (PROTONIX) 40 MG tablet Take 1 tablet (40 mg total) by mouth daily. 03/13/21   Breeback, Jade L, PA-C  predniSONE (DELTASONE) 20 MG tablet Take 3 tabs PO daily x 5 days. 11/23/20   Eliezer Lofts, FNP    Allergies    Metronidazole  Review of Systems   Review of Systems  Constitutional:  Positive for fatigue. Negative for chills and fever.  Respiratory:  Positive for cough. Negative for shortness of breath.   Cardiovascular:  Negative for chest pain.  Gastrointestinal:  Negative for abdominal pain, blood in stool, constipation, diarrhea, nausea and vomiting.  Genitourinary:  Negative for hematuria and vaginal bleeding.  All other systems reviewed and are negative.  Physical Exam Updated Vital Signs BP 106/65 (BP Location: Left Arm)   Pulse 82   Temp 99.8 F (37.7 C) (Oral)   Resp 18   Ht  5\' 1"  (1.549 m)   Wt 90.8 kg   SpO2 100%   BMI 37.82 kg/m   Physical Exam Vitals and nursing note reviewed.  Constitutional:      Appearance: Normal appearance.  HENT:     Head: Normocephalic and atraumatic.     Mouth/Throat:     Lips: Pink.     Mouth: Mucous membranes are moist.  Eyes:     Conjunctiva/sclera: Conjunctivae normal.  Cardiovascular:     Rate and Rhythm: Normal rate and regular rhythm.  Pulmonary:     Effort: Pulmonary effort is normal. No respiratory distress.     Breath sounds: Normal breath sounds.  Abdominal:      General: There is no distension.     Palpations: Abdomen is soft.     Tenderness: There is no abdominal tenderness.  Skin:    General: Skin is warm and dry.     Capillary Refill: Capillary refill takes less than 2 seconds.     Coloration: Skin is pale.  Neurological:     General: No focal deficit present.     Mental Status: She is alert.    ED Results / Procedures / Treatments   Labs (all labs ordered are listed, but only abnormal results are displayed) Labs Reviewed  CBC WITH DIFFERENTIAL/PLATELET - Abnormal; Notable for the following components:      Result Value   RBC 3.77 (*)    Hemoglobin 5.4 (*)    HCT 21.6 (*)    MCV 57.3 (*)    MCH 14.3 (*)    MCHC 25.0 (*)    RDW 21.1 (*)    All other components within normal limits  COMPREHENSIVE METABOLIC PANEL - Abnormal; Notable for the following components:   Glucose, Bld 101 (*)    BUN 5 (*)    Calcium 8.6 (*)    Total Bilirubin 0.2 (*)    All other components within normal limits  RESP PANEL BY RT-PCR (FLU A&B, COVID) [id number]  OCCULT BLOOD X 1 CARD TO LAB, STOOL  PATHOLOGIST SMEAR REVIEW  FERRITIN  IRON AND TIBC    EKG None  Radiology DG Chest 2 View  Result Date: 03/14/2021 CLINICAL DATA:  Weakness. EXAM: CHEST - 2 VIEW COMPARISON:  None. FINDINGS: The heart size and mediastinal contours are within normal limits. Both lungs are clear. The visualized skeletal structures are unremarkable. IMPRESSION: No active cardiopulmonary disease. Electronically Signed   By: Ronney Asters M.D.   On: 03/14/2021 16:15    Procedures Procedures   Medications Ordered in ED Medications - No data to display  ED Course  I have reviewed the triage vital signs and the nursing notes.  Pertinent labs & imaging results that were available during my care of the patient were reviewed by me and considered in my medical decision making (see chart for details).  Clinical Course as of 03/14/21 1642  Tue Mar 14, 2021  1351  Hemoglobin(!!): 5.4 [LR]    Clinical Course User Index [LR] Amity Roes, Cecille Aver, PA-C   MDM Rules/Calculators/A&P                           Patient is otherwise healthy 55 year old female with no significant past medical history who was seen at her primary care yesterday for chronic cough after COVID, was noted to have a hemoglobin of 5.3.  She complains of fatigue and dyspnea on exertion, unable to give a clear timeline on this.  She denies chest pain, abdominal pain, nausea, vomiting, dark stools, or vaginal bleeding.  On exam today patient is afebrile, no acute distress.  Skin appears pale, mucous membranes are pink and moist.  On repeat labs hemoglobin 5.4 here today.  Hemoccult negative.  Labs otherwise unremarkable.  Patient is hemodynamically stable.  No clear source of blood loss.  3:43pm Consulted hospitalist Dr. Roderic Palau at [facility] for admission for symptomatic anemia.   The patient appears reasonably stabilized for admission considering the current resources, flow, and capabilities available in the ED at this time, and I doubt any other [facility] requiring further screening and/or treatment in the ED prior to admission.   I discussed this case with my attending physician Dr. Dina Rich who cosigned this note including patient's presenting symptoms, physical exam, and planned diagnostics and interventions. Attending physician stated agreement with plan or made changes to plan which were implemented.    Final Clinical Impression(s) / ED Diagnoses Final diagnoses:  Severe anemia    Rx / DC Orders ED Discharge Orders     None        Jarmon Javid T, PA-C 03/14/21 1642    Horton, Alvin Critchley, DO 03/15/21 1636

## 2021-03-15 DIAGNOSIS — D509 Iron deficiency anemia, unspecified: Secondary | ICD-10-CM | POA: Diagnosis not present

## 2021-03-15 DIAGNOSIS — R053 Chronic cough: Secondary | ICD-10-CM | POA: Diagnosis not present

## 2021-03-15 DIAGNOSIS — D649 Anemia, unspecified: Secondary | ICD-10-CM | POA: Diagnosis not present

## 2021-03-15 DIAGNOSIS — E538 Deficiency of other specified B group vitamins: Secondary | ICD-10-CM | POA: Diagnosis not present

## 2021-03-15 DIAGNOSIS — U099 Post covid-19 condition, unspecified: Secondary | ICD-10-CM

## 2021-03-15 LAB — CBC
HCT: 26.9 % — ABNORMAL LOW (ref 36.0–46.0)
Hemoglobin: 7.3 g/dL — ABNORMAL LOW (ref 12.0–15.0)
MCH: 16.7 pg — ABNORMAL LOW (ref 26.0–34.0)
MCHC: 27.1 g/dL — ABNORMAL LOW (ref 30.0–36.0)
MCV: 61.6 fL — ABNORMAL LOW (ref 80.0–100.0)
Platelets: 272 10*3/uL (ref 150–400)
RBC: 4.37 MIL/uL (ref 3.87–5.11)
RDW: 23.5 % — ABNORMAL HIGH (ref 11.5–15.5)
WBC: 4.8 10*3/uL (ref 4.0–10.5)
nRBC: 0.6 % — ABNORMAL HIGH (ref 0.0–0.2)

## 2021-03-15 LAB — BASIC METABOLIC PANEL
Anion gap: 7 (ref 5–15)
BUN: 5 mg/dL — ABNORMAL LOW (ref 6–20)
CO2: 25 mmol/L (ref 22–32)
Calcium: 8.6 mg/dL — ABNORMAL LOW (ref 8.9–10.3)
Chloride: 105 mmol/L (ref 98–111)
Creatinine, Ser: 0.41 mg/dL — ABNORMAL LOW (ref 0.44–1.00)
GFR, Estimated: >60 mL/min (ref >60)
Glucose, Bld: 92 mg/dL (ref 70–99)
Potassium: 3.8 mmol/L (ref 3.5–5.1)
Sodium: 137 mmol/L (ref 135–145)

## 2021-03-15 LAB — HIV ANTIBODY (ROUTINE TESTING W REFLEX): HIV Screen 4th Generation wRfx: NONREACTIVE

## 2021-03-15 MED ORDER — MULTI-VITAMIN/MINERALS PO TABS: 1.0000 | ORAL_TABLET | Freq: Every day | ORAL | 0 refills | Status: AC

## 2021-03-15 MED ORDER — VITAMIN B-12 1000 MCG PO TABS: 1000.0000 ug | ORAL_TABLET | Freq: Every day | ORAL | 0 refills | Status: AC

## 2021-03-15 MED ORDER — FOLIC ACID 1 MG PO TABS: 1.0000 mg | ORAL_TABLET | Freq: Every day | ORAL | Status: AC

## 2021-03-15 MED ORDER — MONTELUKAST SODIUM 10 MG PO TABS
10.0000 mg | ORAL_TABLET | Freq: Every day | ORAL | Status: DC
  Administered 2021-03-15: 10 mg via ORAL
  Filled 2021-03-15: qty 1

## 2021-03-15 MED ORDER — POLYSACCHARIDE IRON COMPLEX 150 MG PO CAPS: 150.0000 mg | ORAL_CAPSULE | Freq: Every day | ORAL | 2 refills | Status: DC

## 2021-03-15 NOTE — Plan of Care (Signed)

## 2021-03-15 NOTE — Discharge Summary (Signed)
Physician Discharge Summary  Rebecca Bullock NOB:[id number] DOB: Jun 17, 1965 DOA: 03/14/2021  PCP: Donella Stade, PA-C  Admit date: 03/14/2021 Discharge date: 03/15/2021  Admitted From: Home  Discharge disposition: Home  Recommendations for Outpatient Follow-Up:   Follow up with your primary care provider in one week.  Discuss about endoscopic evaluation for iron deficiency anemia. Celiac work-up has been sent please follow-up as outpatient Check CBC, BMP, magnesium in the next visit.  Discharge Diagnosis:   Principal Problem:   Symptomatic anemia Active Problems:   Post-COVID chronic cough Iron deficiency anemia, Vitamin B12 deficiency  Discharge Condition: Improved.  Diet recommendation:   Regular.  Wound care: None.  Code status: Full.  History of Present Illness:   Rebecca Bullock is a 55 y.o. female with no significant past medical history but history of COVID presented to the hospital with significant anemia.  Patient had been having chronic cough and had presented to her primary care physician when she had a blood work done which showed significant anemia.  Additionally, over the last few weeks patient had been having fatigue and weakness.  Patient denied history of blood in the stool and has had uterine ablation 4 years ago.  In the ED, patient had hemoglobin of 5.4.  Ferritin was 2 and vitamin B12 level was 130.  Fecal occult blood was negative and the patient was admitted to hospital for further evaluation and treatment.    Hospital Course:   Following conditions were addressed during hospitalization as listed below,  Symptomatic anemia secondary to iron deficiency and B12 deficiency.  Patient received 2 PRBC transfusion.  Was started on vitamin B12 injection and will continue oral on discharge.  Patient will be prescribed iron on discharge as well.  Due to some GI symptoms admitting physician has ordered IgA tissue transglutaminase antibody, intrinsic factor  antibodies.  Patient has never had a colonoscopy or EGD  and will need follow-up with GI.  Patient would benefit from endoscopic evaluation as outpatient.  Post COVID persistent cough.   Primary care physician working up the patient and there was plan for echocardiogram and CT scan of the chest.  Chest x-ray was clear.  Patient was advised to continue nasal spray with steroids and continue inhaled albuterol as well.  Patient was advised to continue Protonix for possibility of GERD.  Disposition.  At this time, patient is stable for disposition home with outpatient PCP follow-up.    Medical Consultants:   None.  Procedures:    PRBC transfusion 2 units Subjective:   Today, patient was seen and examined at bedside.  Complains of chronic cough but otherwise feels okay.  Denies chest pain, dizziness, shortness of breath.  Discharge Exam:   Vitals:   03/15/21 0516 03/15/21 1127  BP: 118/67 108/67  Pulse: 79 91  Resp: 18 18  Temp: 98.6 F (37 C) 98.5 F (36.9 C)  SpO2: 98% 97%   Vitals:   03/15/21 0301 03/15/21 0317 03/15/21 0516 03/15/21 1127  BP: (!) 103/50 (!) 99/59 118/67 108/67  Pulse: 82 90 79 91  Resp: 16 17 18 18   Temp: 99.3 F (37.4 C) 98.5 F (36.9 C) 98.6 F (37 C) 98.5 F (36.9 C)  TempSrc: Oral Oral Oral Axillary  SpO2: 99% 99% 98% 97%  Weight:      Height:       General: Alert awake, not in obvious distress HENT: pupils equally reacting to light, mild pallor noted.. Oral mucosa is moist.  Chest:  Clear breath  sounds.  Diminished breath sounds bilaterally. No crackles or wheezes.  CVS: S1 &S2 heard. No murmur.  Regular rate and rhythm. Abdomen: Soft, nontender, nondistended.  Bowel sounds are heard.   Extremities: No cyanosis, clubbing or edema.  Peripheral pulses are palpable. Psych: Alert, awake and oriented, normal mood CNS:  No cranial nerve deficits.  Power equal in all extremities.   Skin: Warm and dry.  No rashes noted.  The results of significant  diagnostics from this hospitalization (including imaging, microbiology, ancillary and laboratory) are listed below for reference.     Diagnostic Studies:   DG Chest 2 View  Result Date: 03/14/2021 CLINICAL DATA:  Weakness. EXAM: CHEST - 2 VIEW COMPARISON:  None. FINDINGS: The heart size and mediastinal contours are within normal limits. Both lungs are clear. The visualized skeletal structures are unremarkable. IMPRESSION: No active cardiopulmonary disease. Electronically Signed   By: Ronney Asters M.D.   On: 03/14/2021 16:15     Labs:   Basic Metabolic Panel: Recent Labs  Lab 03/13/21 0000 03/14/21 1250 03/15/21 0827  NA 138 135 137  K 3.9 3.5 3.8  CL 103 103 105  CO2 26 24 25   GLUCOSE 98 101* 92  BUN 7 5* 5*  CREATININE 0.57 0.62 0.41*  CALCIUM 8.8 8.6* 8.6*   GFR Estimated Creatinine Clearance: 83.5 mL/min (A) (by C-G formula based on SCr of 0.41 mg/dL (L)). Liver Function Tests: Recent Labs  Lab 03/13/21 0000 03/14/21 1250  AST 15 21  ALT 10 15  ALKPHOS  --  104  BILITOT 0.3 0.2*  PROT 7.1 7.3  ALBUMIN  --  3.7   No results for input(s): LIPASE, AMYLASE in the last 168 hours. No results for input(s): AMMONIA in the last 168 hours. Coagulation profile No results for input(s): INR, PROTIME in the last 168 hours.  CBC: Recent Labs  Lab 03/13/21 0000 03/14/21 1250 03/15/21 0827  WBC 6.6 5.1 4.8  NEUTROABS 4,250 2.9  --   HGB 5.3* 5.4* 7.3*  HCT 21.7* 21.6* 26.9*  MCV 57.9* 57.3* 61.6*  PLT 312 302 272   Cardiac Enzymes: No results for input(s): CKTOTAL, CKMB, CKMBINDEX, TROPONINI in the last 168 hours. BNP: Invalid input(s): POCBNP CBG: No results for input(s): GLUCAP in the last 168 hours. D-Dimer No results for input(s): DDIMER in the last 72 hours. Hgb A1c No results for input(s): HGBA1C in the last 72 hours. Lipid Profile No results for input(s): CHOL, HDL, LDLCALC, TRIG, CHOLHDL, LDLDIRECT in the last 72 hours. Thyroid function studies No  results for input(s): TSH, T4TOTAL, T3FREE, THYROIDAB in the last 72 hours.  Invalid input(s): FREET3 Anemia work up Recent Labs    03/14/21 1916 03/14/21 2054  VITAMINB12  --  130*  FOLATE  --  12.3  FERRITIN 2*  --   TIBC 495*  --   IRON 11*  --   RETICCTPCT  --  1.7   Microbiology Recent Results (from the past 240 hour(s))  Resp Panel by RT-PCR (Flu A&B, Covid) Nasopharyngeal Swab     Status: None   Collection Time: 03/14/21  3:12 PM   Specimen: Nasopharyngeal Swab; Nasopharyngeal(NP) swabs in vial transport medium  Result Value Ref Range Status   SARS Coronavirus 2 by RT PCR NEGATIVE NEGATIVE Final    Comment: (NOTE) SARS-CoV-2 target nucleic acids are NOT DETECTED.  The SARS-CoV-2 RNA is generally detectable in upper respiratory specimens during the acute phase of infection. The lowest concentration of SARS-CoV-2 viral copies this  assay can detect is 138 copies/mL. A negative result does not preclude SARS-Cov-2 infection and should not be used as the sole basis for treatment or other patient management decisions. A negative result may occur with  improper specimen collection/handling, submission of specimen other than nasopharyngeal swab, presence of viral mutation(s) within the areas targeted by this assay, and inadequate number of viral copies(<138 copies/mL). A negative result must be combined with clinical observations, patient history, and epidemiological information. The expected result is Negative.  Fact Sheet for Patients:  EntrepreneurPulse.com.au  Fact Sheet for Healthcare Providers:  IncredibleEmployment.be  This test is no t yet approved or cleared by the [place] FDA and  has been authorized for detection and/or diagnosis of SARS-CoV-2 by FDA under an Emergency Use Authorization (EUA). This EUA will remain  in effect (meaning this test can be used) for the duration of the COVID-19 declaration under Section  564(b)(1) of the Act, 21 U.S.C.section 360bbb-3(b)(1), unless the authorization is terminated  or revoked sooner.       Influenza A by PCR NEGATIVE NEGATIVE Final   Influenza B by PCR NEGATIVE NEGATIVE Final    Comment: (NOTE) The Xpert Xpress SARS-CoV-2/FLU/RSV plus assay is intended as an aid in the diagnosis of influenza from Nasopharyngeal swab specimens and should not be used as a sole basis for treatment. Nasal washings and aspirates are unacceptable for Xpert Xpress SARS-CoV-2/FLU/RSV testing.  Fact Sheet for Patients: EntrepreneurPulse.com.au  Fact Sheet for Healthcare Providers: IncredibleEmployment.be  This test is not yet approved or cleared by the [place] FDA and has been authorized for detection and/or diagnosis of SARS-CoV-2 by FDA under an Emergency Use Authorization (EUA). This EUA will remain in effect (meaning this test can be used) for the duration of the COVID-19 declaration under Section 564(b)(1) of the Act, 21 U.S.C. section 360bbb-3(b)(1), unless the authorization is terminated or revoked.  Performed at [facility], [address] [place], [place] [address]      Discharge Instructions:   Discharge Instructions     Diet - low sodium heart healthy   Complete by: As directed    Discharge instructions   Complete by: As directed    Follow-up with your primary care physician within 1 week or as soon as possible to discuss about anemia and possible need for colonoscopy evaluation.  Continue to take iron folic acid vitamin B35 and multivitamins daily.  Improve oral intake.  Seek medical attention for worsening symptoms.  Continue work-up for your chronic cough with your primary care physician.   Increase activity slowly   Complete by: As directed       Allergies as of 03/15/2021       Reactions   Metronidazole    Other reaction(s): GI Upset (intolerance)        Medication List     STOP  taking these medications    baclofen 10 MG tablet Commonly known as: LIORESAL   clobetasol ointment 0.05 % Commonly known as: TEMOVATE   predniSONE 20 MG tablet Commonly known as: DELTASONE       TAKE these medications    albuterol 108 (90 Base) MCG/ACT inhaler Commonly known as: VENTOLIN HFA Inhale 2 puffs into the lungs every 6 (six) hours as needed. What changed: reasons to take this   betamethasone valerate 0.1 % cream Commonly known as: VALISONE Apply 1 application topically See admin instructions. Apply 1 application topically every other day as needed for dermatitis   estradiol 0.1 MG/GM  vaginal cream Commonly known as: ESTRACE Apply a pea-sized amount externally and 0.5 grams twice a week in the morning   fluticasone 50 MCG/ACT nasal spray Commonly known as: FLONASE Place 2 sprays into both nostrils daily.   folic acid 1 MG tablet Commonly known as: FOLVITE Take 1 tablet (1 mg total) by mouth daily.   ibuprofen 800 MG tablet Commonly known as: ADVIL Take 800 mg by mouth every 8 (eight) hours as needed for mild pain.   iron polysaccharides 150 MG capsule Commonly known as: NIFEREX Take 1 capsule (150 mg total) by mouth daily.   montelukast 10 MG tablet Commonly known as: SINGULAIR Take 1 tablet (10 mg total) by mouth at bedtime.   multivitamin with minerals tablet Take 1 tablet by mouth daily.   pantoprazole 40 MG tablet Commonly known as: PROTONIX Take 1 tablet (40 mg total) by mouth daily.   vitamin B-12 1000 MCG tablet Commonly known as: CYANOCOBALAMIN Take 1 tablet (1,000 mcg total) by mouth daily.        Follow-up Information     Donella Stade, PA-C. Schedule an appointment as soon as possible for a visit in 1 week(s).   Specialty: Family Medicine Why: for anemia workup Contact information: [address] [place] [address] [phone]                  Time coordinating discharge: 39  minutes  Signed:  Vasco Chong  [facility] 03/15/2021, 11:50 AM

## 2021-03-15 NOTE — Progress Notes (Signed)
AVS and discharge instructions reviewed with patient and boyfriend at the bedside. Patient verbalized understanding and had no further questions.

## 2021-03-15 NOTE — Plan of Care (Signed)
  Problem: Education: Goal: Knowledge of General Education information will improve Description: Including pain rating scale, medication(s)/side effects and non-pharmacologic comfort measures Outcome: Progressing   Problem: Clinical Measurements: Goal: Diagnostic test results will improve Outcome: Progressing Goal: Cardiovascular complication will be avoided Outcome: Progressing   Problem: Coping: Goal: Level of anxiety will decrease Outcome: Progressing

## 2021-03-16 LAB — TYPE AND SCREEN
ABO/RH(D): A POS
Antibody Screen: NEGATIVE
Unit division: 0
Unit division: 0

## 2021-03-16 LAB — BPAM RBC
Blood Product Expiration Date: 202211082359
Blood Product Expiration Date: 202211082359
ISSUE DATE / TIME: 202210190001
ISSUE DATE / TIME: 202210190257
Unit Type and Rh: 6200
Unit Type and Rh: 6200

## 2021-03-16 LAB — PATHOLOGIST SMEAR REVIEW

## 2021-03-16 LAB — INTRINSIC FACTOR ANTIBODIES: Intrinsic Factor: 9.3 AU/mL — ABNORMAL HIGH (ref 0.0–1.1)

## 2021-03-18 LAB — TISSUE TRANSGLUTAMINASE, IGA: Tissue Transglutaminase Ab, IgA: <2 U/mL (ref 0–3)

## 2021-03-21 ENCOUNTER — Ambulatory Visit: Payer: Managed Care, Other (non HMO) | Admitting: Family Medicine

## 2021-03-23 ENCOUNTER — Encounter: Payer: Self-pay | Admitting: Physician Assistant

## 2021-03-23 ENCOUNTER — Ambulatory Visit (INDEPENDENT_AMBULATORY_CARE_PROVIDER_SITE_OTHER): Payer: 59 | Admitting: Physician Assistant

## 2021-03-23 ENCOUNTER — Other Ambulatory Visit: Payer: Self-pay

## 2021-03-23 VITALS — BP 106/55 | HR 88 | Ht 61.0 in | Wt 196.0 lb

## 2021-03-23 DIAGNOSIS — R0602 Shortness of breath: Secondary | ICD-10-CM | POA: Diagnosis not present

## 2021-03-23 DIAGNOSIS — D649 Anemia, unspecified: Secondary | ICD-10-CM | POA: Diagnosis not present

## 2021-03-23 DIAGNOSIS — R053 Chronic cough: Secondary | ICD-10-CM | POA: Diagnosis not present

## 2021-03-23 LAB — BASIC METABOLIC PANEL WITH GFR
BUN: 7 mg/dL (ref 7–25)
CO2: 28 mmol/L (ref 20–32)
Calcium: 8.8 mg/dL (ref 8.6–10.4)
Chloride: 103 mmol/L (ref 98–110)
Creat: 0.56 mg/dL (ref 0.50–1.03)
Glucose, Bld: 121 mg/dL — ABNORMAL HIGH (ref 65–99)
Potassium: 3.9 mmol/L (ref 3.5–5.3)
Sodium: 139 mmol/L (ref 135–146)
eGFR: 109 mL/min/{1.73_m2} (ref >=60)

## 2021-03-23 LAB — CBC WITH DIFFERENTIAL/PLATELET
Absolute Monocytes: 444 cells/uL (ref 200–950)
Basophils Absolute: 30 cells/uL (ref 0–200)
Basophils Relative: 0.5 %
Eosinophils Absolute: 258 cells/uL (ref 15–500)
Eosinophils Relative: 4.3 %
HCT: 29.4 % — ABNORMAL LOW (ref 35.0–45.0)
Hemoglobin: 8 g/dL — ABNORMAL LOW (ref 11.7–15.5)
Lymphs Abs: 1242 cells/uL (ref 850–3900)
MCH: 17.2 pg — ABNORMAL LOW (ref 27.0–33.0)
MCHC: 27.2 g/dL — ABNORMAL LOW (ref 32.0–36.0)
MCV: 63.1 fL — ABNORMAL LOW (ref 80.0–100.0)
Monocytes Relative: 7.4 %
Neutro Abs: 4026 cells/uL (ref 1500–7800)
Neutrophils Relative %: 67.1 %
Platelets: 297 10*3/uL (ref 140–400)
RBC: 4.66 10*6/uL (ref 3.80–5.10)
RDW: 26 % — ABNORMAL HIGH (ref 11.0–15.0)
Total Lymphocyte: 20.7 %
WBC: 6 10*3/uL (ref 3.8–10.8)

## 2021-03-23 LAB — POCT HEMOGLOBIN: Hemoglobin: 6.4 g/dL — AB (ref 11–14.6)

## 2021-03-23 NOTE — Addendum Note (Signed)
Addended by: Jamesetta So on: 03/23/2021 03:47 PM   Modules accepted: Orders

## 2021-03-23 NOTE — Patient Instructions (Signed)
Will call with GI appt and if you need to get infusion from hematology.  Recheck on Monday hemoglobin with nurse visit if you do not end up getting blood and sooner if feeling weaker or more SOB.  Should be getting a call from CT.

## 2021-03-23 NOTE — Progress Notes (Signed)
Subjective:    Patient ID: Rebecca Bullock, female    DOB: 1966/02/17, 55 y.o.   MRN: [id number]  HPI Pt is a 55 yo female who presents to the clinic to follow up after admission to hospital for severe anemia after labs done in clinic last week found her hemoglobin at 5.4.  She was discharged on 10/19 with hemoglobin of 7.3 after 2 units of PRBC. No source of bleeding was found but no imaging was done. Celiac panel was negative. Found to have vitamin D def and b12 def. She was started on iron, b12, and vitamin D.   She still has a cough but seems to be a little better. Still a little SOB but better than 1 week ago. Started having restless bilateral legs. Taking tylenol at night. Denies any CP, palpitations, abdominal pain, reflux, headaches, melena or hematochezia.   Marland Kitchen. 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   SOB (shortness of breath) 03/13/2021   Microcytic anemia 03/14/2021   Symptomatic anemia 03/14/2021   Chronic cough 03/23/2021   Severe anemia 03/23/2021   SOB (shortness of breath) on exertion 03/23/2021   Resolved Ambulatory Problems    Diagnosis Date Noted   No Resolved Ambulatory Problems   No Additional Past Medical History          Review of Systems    See HPI.  Objective:   Physical Exam Vitals reviewed.  Constitutional:      Comments: Pale appearance.   HENT:     Head: Normocephalic.  Cardiovascular:     Rate and Rhythm: Normal rate and regular rhythm.     Pulses: Normal pulses.     Heart sounds: Normal heart sounds.  Pulmonary:     Effort: Pulmonary effort is normal.     Breath sounds: Normal breath sounds.  Musculoskeletal:     Right lower leg: No edema.     Left lower leg: No edema.  Neurological:     General: No focal deficit present.      Mental Status: She is alert and oriented to person, place, and time.  Psychiatric:        Mood and Affect: Mood normal.   .. Results for orders placed or performed in visit on 03/23/21  POCT hemoglobin  Result Value Ref Range   Hemoglobin 6.4 (A) 11 - 14.6 g/dL         Assessment & Plan:  Marland KitchenMarland KitchenTammy was seen today for hospitalization follow-up.  Diagnoses and all orders for this visit:  Severe anemia -     Ambulatory referral to Gastroenterology -     CBC w/Diff/Platelet -     BASIC METABOLIC PANEL WITH GFR -     POCT hemoglobin  SOB (shortness of breath) on exertion -     Ambulatory referral to Gastroenterology -     CBC w/Diff/Platelet -     BASIC METABOLIC PANEL WITH GFR  Chronic cough -     CT Chest Wo Contrast; Future  Hemoglobin dropped from 7.3 to 6.4 in 8 days.  Will make referral to hematology and gastroenterology.  She could need another IV iron infusion.  It still appears she is losing blood somewhere. Needs colonoscopy and endoscopy.  No red flags.  Will check CBC and BMP in labs STAT.  If  she does not see any specialist needs recheck on hemoglobin on Monday.  Discussed when to go to ED again.  Continues to cough. CT of chest reordered.   Spent 40 minutes with patient reviewing chart, discussing plan, ordering labs and coordinating care.

## 2021-03-24 ENCOUNTER — Other Ambulatory Visit: Payer: Self-pay | Admitting: Family

## 2021-03-24 ENCOUNTER — Encounter: Payer: Self-pay | Admitting: *Deleted

## 2021-03-24 ENCOUNTER — Inpatient Hospital Stay (HOSPITAL_BASED_OUTPATIENT_CLINIC_OR_DEPARTMENT_OTHER): Payer: 59 | Admitting: Family

## 2021-03-24 ENCOUNTER — Telehealth: Payer: Self-pay | Admitting: *Deleted

## 2021-03-24 ENCOUNTER — Other Ambulatory Visit: Payer: Self-pay | Admitting: *Deleted

## 2021-03-24 ENCOUNTER — Inpatient Hospital Stay: Payer: 59 | Attending: Hematology & Oncology

## 2021-03-24 ENCOUNTER — Encounter: Payer: Self-pay | Admitting: Family

## 2021-03-24 VITALS — BP 122/61 | HR 75 | Temp 98.6°F | Resp 17 | Wt 197.0 lb

## 2021-03-24 DIAGNOSIS — D649 Anemia, unspecified: Secondary | ICD-10-CM

## 2021-03-24 DIAGNOSIS — D509 Iron deficiency anemia, unspecified: Secondary | ICD-10-CM | POA: Diagnosis not present

## 2021-03-24 LAB — CMP (CANCER CENTER ONLY)
ALT: 12 U/L (ref 0–44)
AST: 22 U/L (ref 15–41)
Albumin: 4.2 g/dL (ref 3.5–5.0)
Alkaline Phosphatase: 104 U/L (ref 38–126)
Anion gap: 8 (ref 5–15)
BUN: 6 mg/dL (ref 6–20)
CO2: 27 mmol/L (ref 22–32)
Calcium: 9.5 mg/dL (ref 8.9–10.3)
Chloride: 102 mmol/L (ref 98–111)
Creatinine: 0.63 mg/dL (ref 0.44–1.00)
GFR, Estimated: >60 mL/min (ref >60)
Glucose, Bld: 132 mg/dL — ABNORMAL HIGH (ref 70–99)
Potassium: 3.6 mmol/L (ref 3.5–5.1)
Sodium: 137 mmol/L (ref 135–145)
Total Bilirubin: 0.4 mg/dL (ref 0.3–1.2)
Total Protein: 7.3 g/dL (ref 6.5–8.1)

## 2021-03-24 LAB — RETICULOCYTES
Immature Retic Fract: 26.6 % — ABNORMAL HIGH (ref 2.3–15.9)
RBC.: 4.49 MIL/uL (ref 3.87–5.11)
Retic Count, Absolute: 48.5 10*3/uL (ref 19.0–186.0)
Retic Ct Pct: 1.1 % (ref 0.4–3.1)

## 2021-03-24 LAB — SAMPLE TO BLOOD BANK

## 2021-03-24 LAB — CBC WITH DIFFERENTIAL (CANCER CENTER ONLY)
Abs Immature Granulocytes: 0.07 10*3/uL (ref 0.00–0.07)
Basophils Absolute: 0.1 10*3/uL (ref 0.0–0.1)
Basophils Relative: 1 %
Eosinophils Absolute: 0.3 10*3/uL (ref 0.0–0.5)
Eosinophils Relative: 4 %
HCT: 28.1 % — ABNORMAL LOW (ref 36.0–46.0)
Hemoglobin: 7.7 g/dL — ABNORMAL LOW (ref 12.0–15.0)
Immature Granulocytes: 1 %
Lymphocytes Relative: 21 %
Lymphs Abs: 1.5 10*3/uL (ref 0.7–4.0)
MCH: 17.3 pg — ABNORMAL LOW (ref 26.0–34.0)
MCHC: 27.4 g/dL — ABNORMAL LOW (ref 30.0–36.0)
MCV: 63.1 fL — ABNORMAL LOW (ref 80.0–100.0)
Monocytes Absolute: 0.5 10*3/uL (ref 0.1–1.0)
Monocytes Relative: 6 %
Neutro Abs: 4.9 10*3/uL (ref 1.7–7.7)
Neutrophils Relative %: 67 %
Platelet Count: 307 10*3/uL (ref 150–400)
RBC: 4.45 MIL/uL (ref 3.87–5.11)
RDW: 27.2 % — ABNORMAL HIGH (ref 11.5–15.5)
WBC Count: 7.3 10*3/uL (ref 4.0–10.5)
nRBC: 0 % (ref 0.0–0.2)

## 2021-03-24 LAB — PREPARE RBC (CROSSMATCH)

## 2021-03-24 LAB — FOLATE: Folate: 20.4 ng/mL (ref >5.9)

## 2021-03-24 LAB — SAVE SMEAR(SSMR), FOR PROVIDER SLIDE REVIEW

## 2021-03-24 LAB — VITAMIN B12: Vitamin B-12: 362 pg/mL (ref 180–914)

## 2021-03-24 LAB — LACTATE DEHYDROGENASE: LDH: 516 U/L — ABNORMAL HIGH (ref 98–192)

## 2021-03-24 NOTE — Progress Notes (Signed)
Hematology/Oncology Consultation   Name: Rebecca Bullock      MRN: [id number]    Location: Room/bed info not found  Date: 03/24/2021 Time:4:21 PM   REFERRING PHYSICIAN: Iran Planas, PA  REASON FOR CONSULT: Severe anemia, SOB with exertion   DIAGNOSIS: Symptomatic anemia   HISTORY OF PRESENT ILLNESS: Rebecca Bullock is a very pleasant 55 yo caucasian female with recent onset of microcytic anemia.  She is symptomatic with mild SOB, fatigue, chills and restless legs.  Hgb 7.7, MCV 63, platelets 307 and WBC count 7.3.  She is taking B 12, folic acid and an oral iron supplement daily.  She has not noted any obvious blood loss. She does bruise easily on her arms. No petechiae.  She has 3 children and no history of miscarriage.  She had a uterine ablation in 2018 and has not had a cycle since that time.  She has an appointment with GI next week on Monday for further evaluation and work up for cause of anemia.  Her occult blood card testing was negative with PCP.  Her appetite is normal but she states that she has lost 12 lbs over the last month. Weight today is 197 lbs. She is staying well hydrated.  No personal history of cancer. Her maternal grandfather had pancreatic cancer and her mother had lung cancer (smoker).  No known family history of anemia.  No history of diabetes or thyroid disease.  No fever, n/v, cough, dizziness, chest pain, palpitations, abdominal pain or changes in bowel or bladder habits.  She does have history of psoriasis.  No swelling, numbness or tingling in her extremities at this time.  No falls or syncope to report.  No smoking or recreational drug use. She has the rare alcoholic beverage socially.  She works as a Environmental health practitioner.   ROS: All other 10 point review of systems is negative.   PAST MEDICAL HISTORY:   History reviewed. No pertinent past medical history.  ALLERGIES: Allergies  Allergen Reactions   Metronidazole     Other reaction(s): GI Upset (intolerance)       MEDICATIONS:  Current Outpatient Medications on File Prior to Visit  Medication Sig Dispense Refill   albuterol (VENTOLIN HFA) 108 (90 Base) MCG/ACT inhaler Inhale 2 puffs into the lungs every 6 (six) hours as needed. (Patient taking differently: Inhale 2 puffs into the lungs every 6 (six) hours as needed for shortness of breath or wheezing.) 18 g 0   betamethasone valerate (VALISONE) 0.1 % cream Apply 1 application topically See admin instructions. Apply 1 application topically every other day as needed for dermatitis     estradiol (ESTRACE) 0.1 MG/GM vaginal cream Apply a pea-sized amount externally and 0.5 grams twice a week in the morning     fluticasone (FLONASE) 50 MCG/ACT nasal spray Place 2 sprays into both nostrils daily. 16 g 6   folic acid (FOLVITE) 1 MG tablet Take 1 tablet (1 mg total) by mouth daily. 100 tablet    ibuprofen (ADVIL) 800 MG tablet Take 800 mg by mouth every 8 (eight) hours as needed for mild pain.     iron polysaccharides (NIFEREX) 150 MG capsule Take 1 capsule (150 mg total) by mouth daily. 100 capsule 2   montelukast (SINGULAIR) 10 MG tablet Take 1 tablet (10 mg total) by mouth at bedtime. 90 tablet 3   Multiple Vitamins-Minerals (MULTIVITAMIN WITH MINERALS) tablet Take 1 tablet by mouth daily. 100 tablet 0   pantoprazole (PROTONIX) 40 MG tablet Take 1  tablet (40 mg total) by mouth daily. 30 tablet 3   vitamin B-12 (CYANOCOBALAMIN) 1000 MCG tablet Take 1 tablet (1,000 mcg total) by mouth daily. 100 tablet 0   No current facility-administered medications on file prior to visit.     PAST SURGICAL HISTORY Past Surgical History:  Procedure Laterality Date   uterine ablation      FAMILY HISTORY: Family History  Problem Relation Age of Onset   Cancer Mother    Cancer Father    Diabetes Maternal Grandmother    Alcohol abuse Maternal Uncle     SOCIAL HISTORY:  reports that she has never smoked. She has never used smokeless tobacco. She reports current  alcohol use. She reports that she does not currently use drugs.  PERFORMANCE STATUS: The patient's performance status is 1 - Symptomatic but completely ambulatory  PHYSICAL EXAM: Most Recent Vital Signs: Blood pressure 122/61, pulse 75, temperature 98.6 F (37 C), temperature source Oral, resp. rate 17, weight 197 lb (89.4 kg), SpO2 100 %. BP 122/61 (Patient Position: Sitting)   Pulse 75   Temp 98.6 F (37 C) (Oral)   Resp 17   Wt 197 lb (89.4 kg)   SpO2 100%   BMI 37.22 kg/m   General Appearance:    Alert, cooperative, no distress, appears stated age  Head:    Normocephalic, without obvious abnormality, atraumatic  Eyes:    PERRL, conjunctiva/corneas clear, EOM's intact, fundi    benign, both eyes        Throat:   Lips, mucosa, and tongue normal; teeth and gums normal  Neck:   Supple, symmetrical, trachea midline, no adenopathy;    thyroid:  no enlargement/tenderness/nodules; no carotid   bruit or JVD  Back:     Symmetric, no curvature, ROM normal, no CVA tenderness  Lungs:     Clear to auscultation bilaterally, respirations unlabored  Chest Wall:    No tenderness or deformity   Heart:    Regular rate and rhythm, S1 and S2 normal, no murmur, rub   or gallop     Abdomen:     Soft, non-tender, bowel sounds active all four quadrants,    no masses, no organomegaly        Extremities:   Extremities normal, atraumatic, no cyanosis or edema  Pulses:   2+ and symmetric all extremities  Skin:   Skin color, texture, turgor normal, no rashes or lesions  Lymph nodes:   Cervical, supraclavicular, and axillary nodes normal  Neurologic:   CNII-XII intact, normal strength, sensation and reflexes    throughout    LABORATORY DATA:  Results for orders placed or performed in visit on 03/24/21 (from the past 48 hour(s))  CBC with Differential (Cancer Center Only)     Status: Abnormal   Collection Time: 03/24/21  2:52 PM  Result Value Ref Range   WBC Count 7.3 4.0 - 10.5 K/uL   RBC 4.45  3.87 - 5.11 MIL/uL   Hemoglobin 7.7 (L) 12.0 - 15.0 g/dL    Comment: Reticulocyte Hemoglobin testing may be clinically indicated, consider ordering this additional test [id number]    HCT 28.1 (L) 36.0 - 46.0 %   MCV 63.1 (L) 80.0 - 100.0 fL   MCH 17.3 (L) 26.0 - 34.0 pg   MCHC 27.4 (L) 30.0 - 36.0 g/dL   RDW 27.2 (H) 11.5 - 15.5 %   Platelet Count 307 150 - 400 K/uL   nRBC 0.0 0.0 - 0.2 %   Neutrophils Relative %  67 %   Neutro Abs 4.9 1.7 - 7.7 K/uL   Lymphocytes Relative 21 %   Lymphs Abs 1.5 0.7 - 4.0 K/uL   Monocytes Relative 6 %   Monocytes Absolute 0.5 0.1 - 1.0 K/uL   Eosinophils Relative 4 %   Eosinophils Absolute 0.3 0.0 - 0.5 K/uL   Basophils Relative 1 %   Basophils Absolute 0.1 0.0 - 0.1 K/uL   Immature Granulocytes 1 %   Abs Immature Granulocytes 0.07 0.00 - 0.07 K/uL    Comment: Performed at [facility] at [facility], [address] [place], [place] [address]  CMP ([place] only)     Status: Abnormal   Collection Time: 03/24/21  2:52 PM  Result Value Ref Range   Sodium 137 135 - 145 mmol/L   Potassium 3.6 3.5 - 5.1 mmol/L   Chloride 102 98 - 111 mmol/L   CO2 27 22 - 32 mmol/L   Glucose, Bld 132 (H) 70 - 99 mg/dL    Comment: Glucose reference range applies only to samples taken after fasting for at least 8 hours.   BUN 6 6 - 20 mg/dL   Creatinine 0.63 0.44 - 1.00 mg/dL   Calcium 9.5 8.9 - 10.3 mg/dL   Total Protein 7.3 6.5 - 8.1 g/dL   Albumin 4.2 3.5 - 5.0 g/dL   AST 22 15 - 41 U/L   ALT 12 0 - 44 U/L   Alkaline Phosphatase 104 38 - 126 U/L   Total Bilirubin 0.4 0.3 - 1.2 mg/dL   GFR, Estimated >60 >60 mL/min    Comment: (NOTE) Calculated using the CKD-EPI Creatinine Equation (2021)    Anion gap 8 5 - 15    Comment: Performed at [facility] Lab at [facility], [address], [place], [place] [address]  Save [facility]ear [facility])     Status: None   Collection Time: 03/24/21  2:52 PM  Result Value Ref  Range   Smear Review SMEAR STAINED AND AVAILABLE FOR REVIEW     Comment: Performed at [facility] at [facility], [address] [place], [place] [address]  Lactate dehydrogenase (LDH)     Status: Abnormal   Collection Time: 03/24/21  2:53 PM  Result Value Ref Range   LDH 516 (H) 98 - 192 U/L    Comment: Performed at [facility] Lab at [facility], [address] [place], [place] [address]  Reticulocytes     Status: Abnormal   Collection Time: 03/24/21  2:53 PM  Result Value Ref Range   Retic Ct Pct 1.1 0.4 - 3.1 %   RBC. 4.49 3.87 - 5.11 MIL/uL   Retic Count, Absolute 48.5 19.0 - 186.0 K/uL   Immature Retic Fract 26.6 (H) 2.3 - 15.9 %    Comment: Performed at [facility] Lab at [facility], [address] [place], [place] [address]      RADIOGRAPHY: No results found.     PATHOLOGY: None  ASSESSMENT/PLAN: Ms. Krolikowski is a very pleasant 55 yo caucasian female with recent onset of microcytic anemia.  Anemia work-up pending. She will be seeing GI next week for further work up.  We will give her 1 unit of blood and IV iron Monday. She will get a second dose of IV iron later in the week.  Follow-up in 4 weeks.   All questions  were answered. The patient knows to call the clinic with any problems, questions or concerns. We can certainly see the patient much sooner if necessary.   Lottie Dawson, NP

## 2021-03-24 NOTE — Progress Notes (Signed)
Interesting! Our POCT was WAY off yesterday. Hemoglobin is 8.0 which is better than hospital. This is reassuring.  Kidney, liver, calcium improved some too.

## 2021-03-24 NOTE — Telephone Encounter (Signed)
Per referral - Dr. Alden Hipp - called and gave upcoming appointments - confirmed.

## 2021-03-25 ENCOUNTER — Encounter: Payer: Self-pay | Admitting: Family

## 2021-03-25 LAB — ERYTHROPOIETIN: Erythropoietin: 107.5 m[IU]/mL — ABNORMAL HIGH (ref 2.6–18.5)

## 2021-03-27 ENCOUNTER — Other Ambulatory Visit: Payer: Self-pay | Admitting: Physician Assistant

## 2021-03-27 ENCOUNTER — Other Ambulatory Visit: Payer: Self-pay

## 2021-03-27 ENCOUNTER — Inpatient Hospital Stay: Payer: 59

## 2021-03-27 ENCOUNTER — Ambulatory Visit: Payer: 59

## 2021-03-27 VITALS — BP 107/56 | HR 61 | Temp 98.3°F | Resp 20

## 2021-03-27 DIAGNOSIS — D509 Iron deficiency anemia, unspecified: Secondary | ICD-10-CM

## 2021-03-27 DIAGNOSIS — R053 Chronic cough: Secondary | ICD-10-CM

## 2021-03-27 DIAGNOSIS — D649 Anemia, unspecified: Secondary | ICD-10-CM

## 2021-03-27 LAB — IRON AND TIBC
Iron: 18 ug/dL — ABNORMAL LOW (ref 41–142)
Saturation Ratios: 4 % — ABNORMAL LOW (ref 21–57)
TIBC: 461 ug/dL — ABNORMAL HIGH (ref 236–444)
UIBC: 443 ug/dL — ABNORMAL HIGH (ref 120–384)

## 2021-03-27 LAB — FERRITIN: Ferritin: 9 ng/mL — ABNORMAL LOW (ref 11–307)

## 2021-03-27 LAB — HGB FRACTIONATION CASCADE
Hgb A2: 1.7 % — ABNORMAL LOW (ref 1.8–3.2)
Hgb A: 98.3 % (ref 96.4–98.8)
Hgb F: 0 % (ref 0.0–2.0)
Hgb S: 0 %

## 2021-03-27 MED ORDER — SODIUM CHLORIDE 0.9 % IV SOLN
125.0000 mg | Freq: Once | INTRAVENOUS | Status: AC
  Administered 2021-03-27: 125 mg via INTRAVENOUS
  Filled 2021-03-27: qty 125

## 2021-03-27 MED ORDER — FUROSEMIDE 10 MG/ML IJ SOLN: 20.0000 mg | Freq: Once | INTRAMUSCULAR | Status: DC

## 2021-03-27 MED ORDER — SODIUM CHLORIDE 0.9 % IV SOLN: Freq: Once | INTRAVENOUS | Status: AC

## 2021-03-27 MED ORDER — SODIUM CHLORIDE 0.9% FLUSH: 10.0000 mL | Freq: Once | INTRAVENOUS | Status: DC | PRN

## 2021-03-27 MED ORDER — ACETAMINOPHEN 325 MG PO TABS
650.0000 mg | ORAL_TABLET | Freq: Once | ORAL | Status: AC
  Administered 2021-03-27: 650 mg via ORAL
  Filled 2021-03-27: qty 2

## 2021-03-27 MED ORDER — DIPHENHYDRAMINE HCL 25 MG PO CAPS
25.0000 mg | ORAL_CAPSULE | Freq: Once | ORAL | Status: AC
  Administered 2021-03-27: 25 mg via ORAL
  Filled 2021-03-27: qty 1

## 2021-03-27 MED ORDER — SODIUM CHLORIDE 0.9% IV SOLUTION
250.0000 mL | Freq: Once | INTRAVENOUS | Status: AC
  Administered 2021-03-27: 250 mL via INTRAVENOUS

## 2021-03-27 MED ORDER — SODIUM CHLORIDE 0.9% FLUSH: 3.0000 mL | Freq: Once | INTRAVENOUS | Status: DC | PRN

## 2021-03-27 NOTE — Patient Instructions (Signed)
Blood Transfusion, Adult A blood transfusion is a procedure in which you receive blood or a type of blood cell (blood component) through an IV. You may need a blood transfusion when your blood level is low. This may result from a bleeding disorder, illness, injury, or surgery. The blood may come from a donor. You may also be able to donate blood for yourself (autologous blood donation) before a planned surgery. The blood given in a transfusion is made up of different blood components. You may receive: Red blood cells. These carry oxygen to the cells in the body. Platelets. These help your blood to clot. Plasma. This is the liquid part of your blood. It carries proteins and other substances throughout the body. White blood cells. These help you fight infections. If you have hemophilia or another clotting disorder, you may also receive other types of blood products. Tell a health care provider about: Any blood disorders you have. Any previous reactions you have had during a blood transfusion. Any allergies you have. All medicines you are taking, including vitamins, herbs, eye drops, creams, and over-the-counter medicines. Any surgeries you have had. Any medical conditions you have, including any recent fever or cold symptoms. Whether you are pregnant or may be pregnant. What are the risks? Generally, this is a safe procedure. However, problems may occur. The most common problems include: A mild allergic reaction, such as red, swollen areas of skin (hives) and itching. Fever or chills. This may be the body's response to new blood cells received. This may occur during or up to 4 hours after the transfusion. More serious problems may include: Transfusion-associated circulatory overload (TACO), or too much fluid in the lungs. This may cause breathing problems. A serious allergic reaction, such as difficulty breathing or swelling around the face and lips. Transfusion-related acute lung injury  (TRALI), which causes breathing difficulty and low oxygen in the blood. This can occur within hours of the transfusion or several days later. Iron overload. This can happen after receiving many blood transfusions over a period of time. Infection or virus being transmitted. This is rare because donated blood is carefully tested before it is given. Hemolytic transfusion reaction. This is rare. It happens when your body's defense system (immune system)tries to attack the new blood cells. Symptoms may include fever, chills, nausea, low blood pressure, and low back or chest pain. Transfusion-associated graft-versus-host disease (TAGVHD). This is rare. It happens when donated cells attack your body's healthy tissues. What happens before the procedure? Medicines Ask your health care provider about: Changing or stopping your regular medicines. This is especially important if you are taking diabetes medicines or blood thinners. Taking medicines such as aspirin and ibuprofen. These medicines can thin your blood. Do not take these medicines unless your health care provider tells you to take them. Taking over-the-counter medicines, vitamins, herbs, and supplements. General instructions Follow instructions from your health care provider about eating and drinking restrictions. You will have a blood test to determine your blood type. This is necessary to know what kind of blood your body will accept and to match it to the donor blood. If you are going to have a planned surgery, you may be able to do an autologous blood donation. This may be done in case you need to have a transfusion. You will have your temperature, blood pressure, and pulse monitored before the transfusion. If you have had an allergic reaction to a transfusion in the past, you may be given medicine to help prevent   a reaction. This medicine may be given to you by mouth (orally) or through an IV. Set aside time for the blood transfusion. This  procedure generally takes 1-4 hours to complete. What happens during the procedure?  An IV will be inserted into one of your veins. The bag of donated blood will be attached to your IV. The blood will then enter through your vein. Your temperature, blood pressure, and pulse will be monitored regularly during the transfusion. This monitoring is done to detect early signs of a transfusion reaction. Tell your nurse right away if you have any of these symptoms during the transfusion: Shortness of breath or trouble breathing. Chest or back pain. Fever or chills. Hives or itching. If you have any signs or symptoms of a reaction, your transfusion will be stopped and you may be given medicine. When the transfusion is complete, your IV will be removed. Pressure may be applied to the IV site for a few minutes. A bandage (dressing)will be applied. The procedure may vary among health care providers and hospitals. What happens after the procedure? Your temperature, blood pressure, pulse, breathing rate, and blood oxygen level will be monitored until you leave the hospital or clinic. Your blood may be tested to see how you are responding to the transfusion. You may be warmed with fluids or blankets to maintain a normal body temperature. If you receive your blood transfusion in an outpatient setting, you will be told whom to contact to report any reactions. Where to find more information For more information on blood transfusions, visit the American Red Cross: redcross.org Summary A blood transfusion is a procedure in which you receive blood or a type of blood cell (blood component) through an IV. The blood you receive may come from a donor or be donated by yourself (autologous blood donation) before a planned surgery. The blood given in a transfusion is made up of different blood components. You may receive red blood cells, platelets, plasma, or white blood cells depending on the condition treated. Your  temperature, blood pressure, and pulse will be monitored before, during, and after the transfusion. After the transfusion, your blood may be tested to see how your body has responded. This information is not intended to replace advice given to you by your health care provider. Make sure you discuss any questions you have with your health care provider. Document Revised: 03/19/2019 Document Reviewed: 11/06/2018 Elsevier Patient Education  2022 [place]. Sodium Ferric Gluconate Complex Injection What is this medication? SODIUM FERRIC GLUCONATE COMPLEX (SOE dee um FER ik GLOO koe nate KOM pleks) treats low levels of iron (iron deficiency anemia) in people with kidney disease. Iron is a mineral that plays an important role in making red blood cells, which carry oxygen from your lungs to the rest of your body. This medicine may be used for other purposes; ask your health care provider or pharmacist if you have questions. COMMON BRAND NAME(S): Ferrlecit, Nulecit What should I tell my care team before I take this medication? They need to know if you have any of the following conditions: Anemia that is not from iron deficiency High levels of iron in the blood An unusual or allergic reaction to iron, other medications, foods, dyes, or preservatives Pregnant or are trying to become pregnant Breast-feeding How should I use this medication? This medication is injected into a vein. It is given by your care team in a hospital or clinic setting. Talk to your care team about the use of  this medication in children. While it may be prescribed for children as young as 6 years for selected conditions, precautions do apply. Overdosage: If you think you have taken too much of this medicine contact a poison control center or emergency room at once. NOTE: This medicine is only for you. Do not share this medicine with others. What if I miss a dose? It is important not to miss your dose. Call your care team if you  are unable to keep an appointment. What may interact with this medication? Do not take this medication with any of the following: Deferasirox Deferoxamine Dimercaprol This medication may also interact with the following: Other iron products This list may not describe all possible interactions. Give your health care provider a list of all the medicines, herbs, non-prescription drugs, or dietary supplements you use. Also tell them if you smoke, drink alcohol, or use illegal drugs. Some items may interact with your medicine. What should I watch for while using this medication? Your condition will be monitored carefully while you are receiving this medication. Visit your care team for regular checks on your progress. You may need blood work while you are taking this medication. What side effects may I notice from receiving this medication? Side effects that you should report to your care team as soon as possible: Allergic reactions-skin rash, itching, hives, swelling of the face, lips, tongue, or throat Low blood pressure-dizziness, feeling faint or lightheaded, blurry vision Shortness of breath Side effects that usually do not require medical attention (report to your care team if they continue or are bothersome): Flushing Headache Joint pain Muscle pain Nausea Pain, redness, or irritation at injection site This list may not describe all possible side effects. Call your doctor for medical advice about side effects. You may report side effects to FDA at 1-800-FDA-1088. Where should I keep my medication? This medication is given in a hospital or clinic and will not be stored at home. NOTE: This sheet is a summary. It may not cover all possible information. If you have questions about this medicine, talk to your doctor, pharmacist, or health care provider.  2022 Elsevier/Gold Standard (2020-06-27 11:46:34)

## 2021-03-27 NOTE — Progress Notes (Signed)
CT ordered. 

## 2021-03-28 LAB — TYPE AND SCREEN
ABO/RH(D): A POS
Antibody Screen: NEGATIVE
Unit division: 0

## 2021-03-28 LAB — BPAM RBC
Blood Product Expiration Date: 202211212359
ISSUE DATE / TIME: 202210310742
Unit Type and Rh: 6200

## 2021-03-31 ENCOUNTER — Inpatient Hospital Stay: Payer: 59 | Attending: Hematology & Oncology

## 2021-03-31 ENCOUNTER — Other Ambulatory Visit: Payer: Self-pay

## 2021-03-31 VITALS — BP 120/64 | HR 60 | Temp 98.1°F | Resp 18

## 2021-03-31 DIAGNOSIS — C182 Malignant neoplasm of ascending colon: Secondary | ICD-10-CM | POA: Diagnosis not present

## 2021-03-31 DIAGNOSIS — D649 Anemia, unspecified: Secondary | ICD-10-CM

## 2021-03-31 DIAGNOSIS — D509 Iron deficiency anemia, unspecified: Secondary | ICD-10-CM | POA: Diagnosis present

## 2021-03-31 MED ORDER — SODIUM CHLORIDE 0.9 % IV SOLN
125.0000 mg | Freq: Once | INTRAVENOUS | Status: AC
  Administered 2021-03-31: 125 mg via INTRAVENOUS
  Filled 2021-03-31: qty 125

## 2021-03-31 MED ORDER — SODIUM CHLORIDE 0.9 % IV SOLN: Freq: Once | INTRAVENOUS | Status: AC

## 2021-03-31 NOTE — Patient Instructions (Signed)
Sodium Ferric Gluconate Complex Injection ?What is this medication? ?SODIUM FERRIC GLUCONATE COMPLEX (SOE dee um FER ik GLOO koe nate KOM pleks) treats low levels of iron (iron deficiency anemia) in people with kidney disease. Iron is a mineral that plays an important role in making red blood cells, which carry oxygen from your lungs to the rest of your body. ?This medicine may be used for other purposes; ask your health care provider or pharmacist if you have questions. ?COMMON BRAND NAME(S): Ferrlecit, Nulecit ?What should I tell my care team before I take this medication? ?They need to know if you have any of the following conditions: ?Anemia that is not from iron deficiency ?High levels of iron in the blood ?An unusual or allergic reaction to iron, other medications, foods, dyes, or preservatives ?Pregnant or are trying to become pregnant ?Breast-feeding ?How should I use this medication? ?This medication is injected into a vein. It is given by your care team in a hospital or clinic setting. ?Talk to your care team about the use of this medication in children. While it may be prescribed for children as young as 6 years for selected conditions, precautions do apply. ?Overdosage: If you think you have taken too much of this medicine contact a poison control center or emergency room at once. ?NOTE: This medicine is only for you. Do not share this medicine with others. ?What if I miss a dose? ?It is important not to miss your dose. Call your care team if you are unable to keep an appointment. ?What may interact with this medication? ?Do not take this medication with any of the following: ?Deferasirox ?Deferoxamine ?Dimercaprol ?This medication may also interact with the following: ?Other iron products ?This list may not describe all possible interactions. Give your health care provider a list of all the medicines, herbs, non-prescription drugs, or dietary supplements you use. Also tell them if you smoke, drink  alcohol, or use illegal drugs. Some items may interact with your medicine. ?What should I watch for while using this medication? ?Your condition will be monitored carefully while you are receiving this medication. ?Visit your care team for regular checks on your progress. You may need blood work while you are taking this medication. ?What side effects may I notice from receiving this medication? ?Side effects that you should report to your care team as soon as possible: ?Allergic reactions--skin rash, itching, hives, swelling of the face, lips, tongue, or throat ?Low blood pressure--dizziness, feeling faint or lightheaded, blurry vision ?Shortness of breath ?Side effects that usually do not require medical attention (report to your care team if they continue or are bothersome): ?Flushing ?Headache ?Joint pain ?Muscle pain ?Nausea ?Pain, redness, or irritation at injection site ?This list may not describe all possible side effects. Call your doctor for medical advice about side effects. You may report side effects to FDA at 1-800-FDA-1088. ?Where should I keep my medication? ?This medication is given in a hospital or clinic and will not be stored at home. ?NOTE: This sheet is a summary. It may not cover all possible information. If you have questions about this medicine, talk to your doctor, pharmacist, or health care provider. ?? 2022 Elsevier/Gold Standard (2020-10-07 00:00:00) ? ?

## 2021-04-03 ENCOUNTER — Other Ambulatory Visit: Payer: Self-pay | Admitting: Family

## 2021-04-03 ENCOUNTER — Telehealth: Payer: Self-pay

## 2021-04-03 NOTE — Telephone Encounter (Signed)
Pt called in and stated that she has started her menstrual cycle. Pt states it has been over 3.5 years since her last cycle and she had an ablation in 2018. Pt states her cycle has been intermittent since Saturday. Pt is concerned because in the last 3 weeks she has had 3 units of blood and 2 doses of IV Iron. Pt would like to know if she should start back Iron supplements, be seen sooner and know her lab results.  Per Judson Roch she needs to follow up with her GYN with these concerns. Pt's labs were fine and she is glad that pt is scheduled for a f/u with GI as pt needs a colonoscopy and endoscopy. Judson Roch states it is too soon to know if she needs more Iron as it takes time to take affect. Pt advised and made aware to go to the ER if she has palpitations, SOB or dizziness. Pt stated understanding.

## 2021-04-05 ENCOUNTER — Other Ambulatory Visit: Payer: Self-pay

## 2021-04-05 ENCOUNTER — Ambulatory Visit (INDEPENDENT_AMBULATORY_CARE_PROVIDER_SITE_OTHER): Payer: 59

## 2021-04-05 ENCOUNTER — Ambulatory Visit (HOSPITAL_BASED_OUTPATIENT_CLINIC_OR_DEPARTMENT_OTHER): Payer: 59

## 2021-04-05 DIAGNOSIS — R053 Chronic cough: Secondary | ICD-10-CM

## 2021-04-06 ENCOUNTER — Other Ambulatory Visit: Payer: Self-pay | Admitting: Physician Assistant

## 2021-04-06 ENCOUNTER — Encounter: Payer: Self-pay | Admitting: Physician Assistant

## 2021-04-06 DIAGNOSIS — K449 Diaphragmatic hernia without obstruction or gangrene: Secondary | ICD-10-CM | POA: Insufficient documentation

## 2021-04-06 DIAGNOSIS — D7389 Other diseases of spleen: Secondary | ICD-10-CM | POA: Insufficient documentation

## 2021-04-06 DIAGNOSIS — K769 Liver disease, unspecified: Secondary | ICD-10-CM | POA: Insufficient documentation

## 2021-04-06 NOTE — Progress Notes (Signed)
Ct showed liver lesion 7.7cm and splenic lesion 1.4cm. we need MRI to further investigate.  Your lungs look good.  You do have a small hiatal hernia(stomach comes through the diaphragm into chest) protonix can help with that. You also can discuss with GI. At times it can cause chronic cough.

## 2021-04-10 ENCOUNTER — Ambulatory Visit (INDEPENDENT_AMBULATORY_CARE_PROVIDER_SITE_OTHER): Payer: 59 | Admitting: Physician Assistant

## 2021-04-10 ENCOUNTER — Other Ambulatory Visit: Payer: Self-pay

## 2021-04-10 VITALS — BP 113/74 | HR 98 | Ht 61.0 in | Wt 194.0 lb

## 2021-04-10 DIAGNOSIS — K769 Liver disease, unspecified: Secondary | ICD-10-CM | POA: Diagnosis not present

## 2021-04-10 DIAGNOSIS — D7389 Other diseases of spleen: Secondary | ICD-10-CM

## 2021-04-10 DIAGNOSIS — N950 Postmenopausal bleeding: Secondary | ICD-10-CM | POA: Diagnosis not present

## 2021-04-10 DIAGNOSIS — Z86018 Personal history of other benign neoplasm: Secondary | ICD-10-CM

## 2021-04-10 DIAGNOSIS — R1032 Left lower quadrant pain: Secondary | ICD-10-CM

## 2021-04-10 DIAGNOSIS — K449 Diaphragmatic hernia without obstruction or gangrene: Secondary | ICD-10-CM

## 2021-04-10 DIAGNOSIS — D509 Iron deficiency anemia, unspecified: Secondary | ICD-10-CM

## 2021-04-10 DIAGNOSIS — R1031 Right lower quadrant pain: Secondary | ICD-10-CM

## 2021-04-10 NOTE — Progress Notes (Signed)
   Subjective:    Patient ID: Rebecca Bullock, female    DOB: [birth year], 55 y.o.   MRN: [id number]  HPI Pt is a 55 yo female with recent dx of severe anemia but no cause. CT of chest was done and showed liver and spleen lesion. Her anemia is being managed by hematology. Her last infusion was 11/4. No more are scheduled. She is scheduled to have blood work this month on 28th. She is feeling better. Only new symptom is vaginal spotting. She has not bled in 3 years. No abdominal pain but she is having some cramping. Hx of uterine fibroids.   She has not had echo done yet.   .. 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   SOB (shortness of breath) 03/13/2021   Microcytic anemia 03/14/2021   Symptomatic anemia 03/14/2021   Chronic cough 03/23/2021   Severe anemia 03/23/2021   SOB (shortness of breath) on exertion 03/23/2021   IDA (iron deficiency anemia) 03/24/2021   Splenic lesion 04/06/2021   Liver lesion 04/06/2021   Hiatal hernia 04/06/2021   Post-menopausal bleeding 04/10/2021   Bilateral lower abdominal cramping 04/11/2021   History of uterine fibroid 04/11/2021   Resolved Ambulatory Problems    Diagnosis Date Noted   No Resolved Ambulatory Problems   No Additional Past Medical History     Review of Systems See HPI.     Objective:   Physical Exam Vitals reviewed.  Constitutional:      Appearance: Normal appearance. She is obese.  HENT:     Head: Normocephalic.  Cardiovascular:     Rate and Rhythm: Normal rate and regular rhythm.     Pulses: Normal pulses.  Pulmonary:     Effort: Pulmonary effort is normal.     Breath sounds: Normal breath sounds.  Abdominal:     General: Bowel sounds are normal. There is no distension.     Palpations: Abdomen is soft. There is  no mass.     Tenderness: There is no abdominal tenderness. There is no right CVA tenderness, left CVA tenderness, guarding or rebound.  Neurological:     General: No focal deficit present.     Mental Status: She is alert.  Psychiatric:        Mood and Affect: Mood normal.          Assessment & Plan:  Marland KitchenMarland KitchenTammy was seen today for follow-up.  Diagnoses and all orders for this visit:  Liver lesion  Lesion of spleen  Hiatal hernia  Post-menopausal bleeding -     MR Pelvis W Wo Contrast; Future  Bilateral lower abdominal cramping -     MR Pelvis W Wo Contrast; Future  History of uterine fibroid  Microcytic anemia  Reached out to imaging. They are waiting to schedule MRI of abdomen due to iron infusion. Need to wait 4-6 weeks.  Discussed size of liver and spleen lesion.  MRI of pelvis ordered due to Post menopausal bleeding/hx of uterine fibroid/abdominal cramping/severe anemia.  Needs follow up and biopsy with GYN. Pt will make appt.  Continue to follow up with hematology for anemia management. If becomes symptomatic please call office for labs.

## 2021-04-10 NOTE — Patient Instructions (Signed)
Need MRI of abdomen and pelvis to evaluate liver and spleen lesions as well as bleeding.

## 2021-04-11 ENCOUNTER — Encounter: Payer: Self-pay | Admitting: Physician Assistant

## 2021-04-11 DIAGNOSIS — R1031 Right lower quadrant pain: Secondary | ICD-10-CM | POA: Insufficient documentation

## 2021-04-11 DIAGNOSIS — Z86018 Personal history of other benign neoplasm: Secondary | ICD-10-CM | POA: Insufficient documentation

## 2021-04-18 ENCOUNTER — Encounter: Payer: Self-pay | Admitting: *Deleted

## 2021-04-18 ENCOUNTER — Other Ambulatory Visit: Payer: Self-pay | Admitting: Family

## 2021-04-18 NOTE — Progress Notes (Signed)
Patient was seen in our office a few weeks ago, by our NP,  for anemia workup. Today she had a colonoscopy which revealed a colonic mass. Biopsies sent. Patient had a follow up appointment on Monday with the NP. Appointment rescheduled with Dr [name] for new likely colorectal cancer diagnosis. Patient just discharged from her procedure so will call tomorrow with new appointment details.   Oncology Nurse Navigator Documentation  Oncology Nurse Navigator Flowsheets 04/18/2021  Abnormal Finding Date 04/18/2021  Navigator Follow Up Date: 04/19/2021  Navigator Follow Up Reason: Patient Call  Navigator Location [facility]  Patient Visit Type MedOnc  Treatment Phase Abnormal Scans  Barriers/Navigation Needs Coordination of Care;Education  Education Other  Interventions Coordination of Care;Education  Acuity Level 2-Minimal Needs (1-2 Barriers Identified)  Coordination of Care Appts  Time Spent with Patient 30

## 2021-04-19 ENCOUNTER — Encounter: Payer: Self-pay | Admitting: *Deleted

## 2021-04-19 ENCOUNTER — Ambulatory Visit: Payer: 59 | Admitting: Family

## 2021-04-19 ENCOUNTER — Inpatient Hospital Stay: Payer: 59

## 2021-04-19 NOTE — Progress Notes (Signed)
Reached out to Germaine Pomfret to introduce myself as the office RN Navigator and explain our new patient process. Reviewed the reason for their referral and scheduled their new patient appointment along with labs. Reviewed with patient any concerns they may have or any possible barriers to attending their appointment.   Oncology Nurse Navigator Documentation  Oncology Nurse Navigator Flowsheets 04/19/2021  Abnormal Finding Date -  Navigator Follow Up Date: 04/24/2021  Navigator Follow Up Reason: New Patient Appointment  Navigator Location [facility]  Navigator Encounter Type Introductory Phone Call  Patient Visit Type MedOnc  Treatment Phase Abnormal Scans  Barriers/Navigation Needs Coordination of Care;Education  Education Other  Interventions Education;Psycho-Social Support  Acuity Level 2-Minimal Needs (1-2 Barriers Identified)  Coordination of Care Appts  Education Method Verbal  Time Spent with Patient 15

## 2021-04-24 ENCOUNTER — Other Ambulatory Visit: Payer: Self-pay

## 2021-04-24 ENCOUNTER — Other Ambulatory Visit: Payer: Self-pay | Admitting: Unknown Physician Specialty

## 2021-04-24 ENCOUNTER — Other Ambulatory Visit: Payer: 59

## 2021-04-24 ENCOUNTER — Encounter: Payer: Self-pay | Admitting: *Deleted

## 2021-04-24 ENCOUNTER — Inpatient Hospital Stay (HOSPITAL_BASED_OUTPATIENT_CLINIC_OR_DEPARTMENT_OTHER): Payer: 59 | Admitting: Hematology & Oncology

## 2021-04-24 ENCOUNTER — Encounter: Payer: Self-pay | Admitting: Hematology & Oncology

## 2021-04-24 ENCOUNTER — Inpatient Hospital Stay: Payer: 59

## 2021-04-24 ENCOUNTER — Ambulatory Visit (INDEPENDENT_AMBULATORY_CARE_PROVIDER_SITE_OTHER): Payer: 59

## 2021-04-24 ENCOUNTER — Ambulatory Visit: Payer: 59 | Admitting: Family

## 2021-04-24 VITALS — BP 96/61 | HR 69 | Temp 98.1°F | Resp 18 | Wt 193.0 lb

## 2021-04-24 DIAGNOSIS — R1032 Left lower quadrant pain: Secondary | ICD-10-CM | POA: Diagnosis not present

## 2021-04-24 DIAGNOSIS — D649 Anemia, unspecified: Secondary | ICD-10-CM

## 2021-04-24 DIAGNOSIS — C189 Malignant neoplasm of colon, unspecified: Secondary | ICD-10-CM

## 2021-04-24 DIAGNOSIS — C182 Malignant neoplasm of ascending colon: Secondary | ICD-10-CM | POA: Diagnosis not present

## 2021-04-24 DIAGNOSIS — C787 Secondary malignant neoplasm of liver and intrahepatic bile duct: Secondary | ICD-10-CM | POA: Diagnosis not present

## 2021-04-24 DIAGNOSIS — D509 Iron deficiency anemia, unspecified: Secondary | ICD-10-CM

## 2021-04-24 DIAGNOSIS — R1031 Right lower quadrant pain: Secondary | ICD-10-CM

## 2021-04-24 DIAGNOSIS — R9389 Abnormal findings on diagnostic imaging of other specified body structures: Secondary | ICD-10-CM

## 2021-04-24 DIAGNOSIS — N950 Postmenopausal bleeding: Secondary | ICD-10-CM

## 2021-04-24 LAB — CMP (CANCER CENTER ONLY)
ALT: 12 U/L (ref 0–44)
AST: 21 U/L (ref 15–41)
Albumin: 3.8 g/dL (ref 3.5–5.0)
Alkaline Phosphatase: 118 U/L (ref 38–126)
Anion gap: 7 (ref 5–15)
BUN: 5 mg/dL — ABNORMAL LOW (ref 6–20)
CO2: 30 mmol/L (ref 22–32)
Calcium: 9.4 mg/dL (ref 8.9–10.3)
Chloride: 104 mmol/L (ref 98–111)
Creatinine: 0.61 mg/dL (ref 0.44–1.00)
GFR, Estimated: >60 mL/min (ref >60)
Glucose, Bld: 102 mg/dL — ABNORMAL HIGH (ref 70–99)
Potassium: 4.3 mmol/L (ref 3.5–5.1)
Sodium: 141 mmol/L (ref 135–145)
Total Bilirubin: 0.2 mg/dL — ABNORMAL LOW (ref 0.3–1.2)
Total Protein: 6.3 g/dL — ABNORMAL LOW (ref 6.5–8.1)

## 2021-04-24 LAB — CBC WITH DIFFERENTIAL (CANCER CENTER ONLY)
Abs Immature Granulocytes: 0.01 10*3/uL (ref 0.00–0.07)
Basophils Absolute: 0 10*3/uL (ref 0.0–0.1)
Basophils Relative: 0 %
Eosinophils Absolute: 0.5 10*3/uL (ref 0.0–0.5)
Eosinophils Relative: 7 %
HCT: 33.4 % — ABNORMAL LOW (ref 36.0–46.0)
Hemoglobin: 9.7 g/dL — ABNORMAL LOW (ref 12.0–15.0)
Immature Granulocytes: 0 %
Lymphocytes Relative: 23 %
Lymphs Abs: 1.6 10*3/uL (ref 0.7–4.0)
MCH: 21.1 pg — ABNORMAL LOW (ref 26.0–34.0)
MCHC: 29 g/dL — ABNORMAL LOW (ref 30.0–36.0)
MCV: 72.8 fL — ABNORMAL LOW (ref 80.0–100.0)
Monocytes Absolute: 0.5 10*3/uL (ref 0.1–1.0)
Monocytes Relative: 7 %
Neutro Abs: 4.2 10*3/uL (ref 1.7–7.7)
Neutrophils Relative %: 63 %
Platelet Count: 280 10*3/uL (ref 150–400)
RBC: 4.59 MIL/uL (ref 3.87–5.11)
RDW: 28.3 % — ABNORMAL HIGH (ref 11.5–15.5)
WBC Count: 6.7 10*3/uL (ref 4.0–10.5)
nRBC: 0 % (ref 0.0–0.2)

## 2021-04-24 LAB — SAMPLE TO BLOOD BANK

## 2021-04-24 LAB — RETICULOCYTES
Immature Retic Fract: 16.1 % — ABNORMAL HIGH (ref 2.3–15.9)
RBC.: 4.64 MIL/uL (ref 3.87–5.11)
Retic Count, Absolute: 30.6 10*3/uL (ref 19.0–186.0)
Retic Ct Pct: 0.7 % (ref 0.4–3.1)

## 2021-04-24 IMAGING — MR MR PELVIS WO/W CM
9 of 18 series · 21 of 48 positions shown · IV contrast (gadavist)
Comparison: None.

CLINICAL DATA: Pelvic pain, postmenopausal bleeding for 3 weeks,
history of endometrial ablation, recent diagnosis colon cancer

EXAM:
MRI PELVIS WITHOUT AND WITH CONTRAST
TECHNIQUE: Multiplanar multisequence MR imaging of the pelvis was performed
both before and after administration of intravenous contrast.
CONTRAST:  9mL GADAVIST GADOBUTROL 1 MMOL/ML IV SOLN

[Series 2: cor ssfse / · coronal · 5.0mm · 1.41mm/px · 1 of 34 slices shown]
[im 1/34]
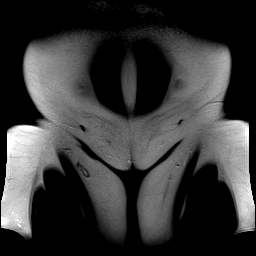

[Series 3: T2 · axial · 5.0mm · 0.55mm/px · 1 of 36 slices shown (1 of 3)]
[im 1/36]
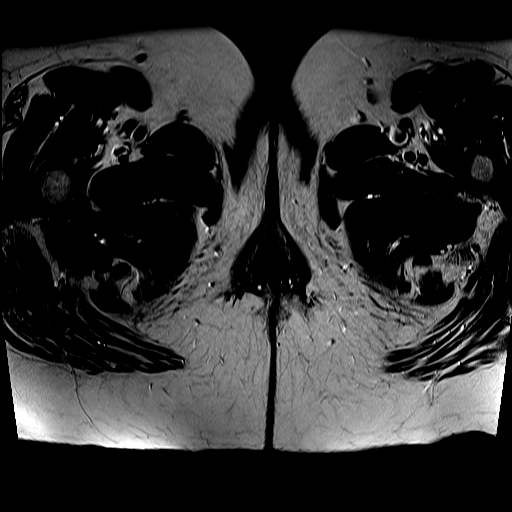

[Series 4: T2 fat-sat · axial · 5.0mm · 0.73mm/px · 1 of 36 slices shown]
[im 1/36]
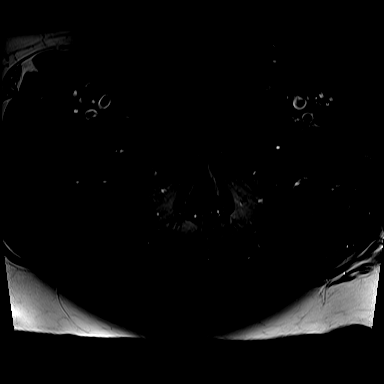

[Series 5: T2 · sagittal · 4.0mm · 0.73mm/px · 2 of 38 slices shown (2 of 3)]
[im 1/38]
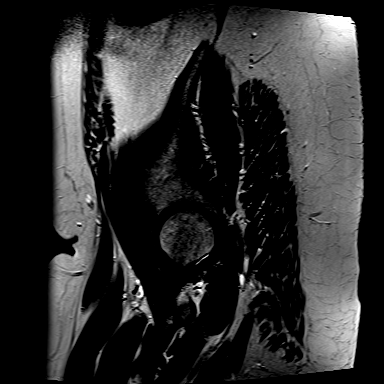
[im 38/38]
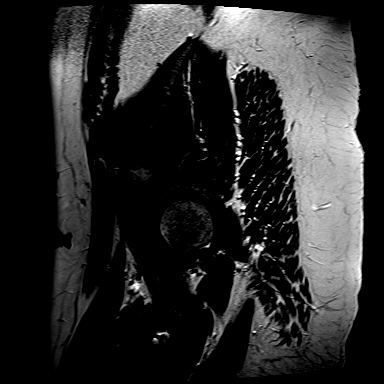

[Series 6: T2 · coronal · 4.0mm · 0.73mm/px · 2 of 38 slices shown (3 of 3)]
[im 1/38]
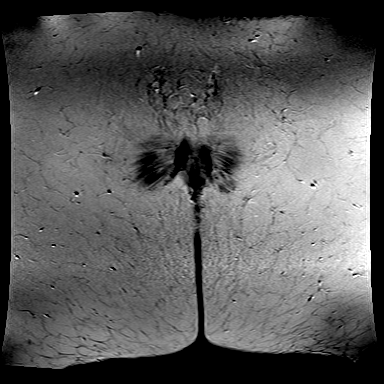
[im 38/38]
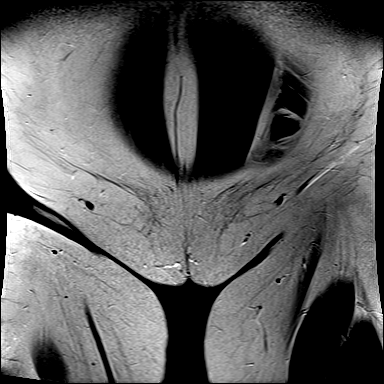

[Series 7: T1 · axial · 5.0mm · 0.55mm/px · z∈[-65,+156]mm · 4 of 76 slices shown]
[im 1/76]
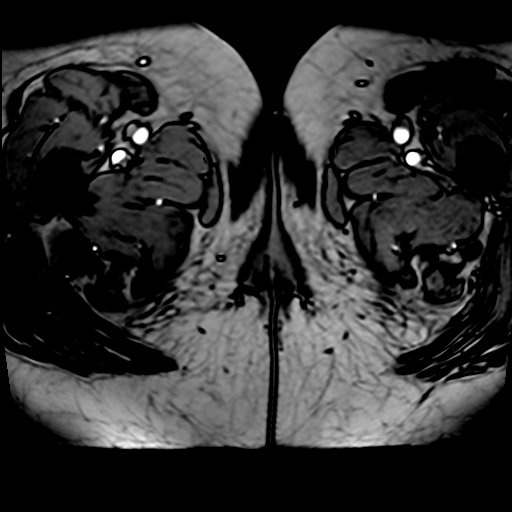
[im 26/76]
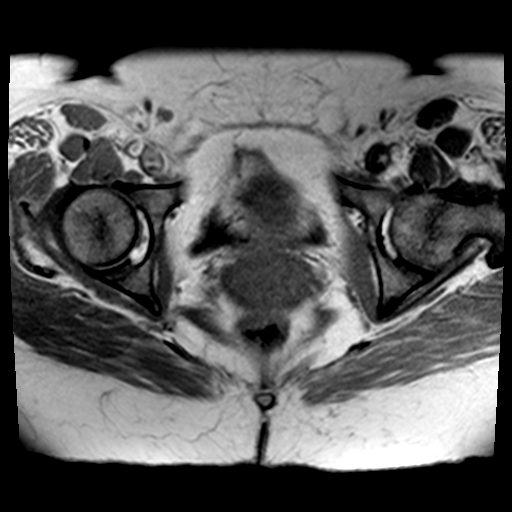
[im 51/76]
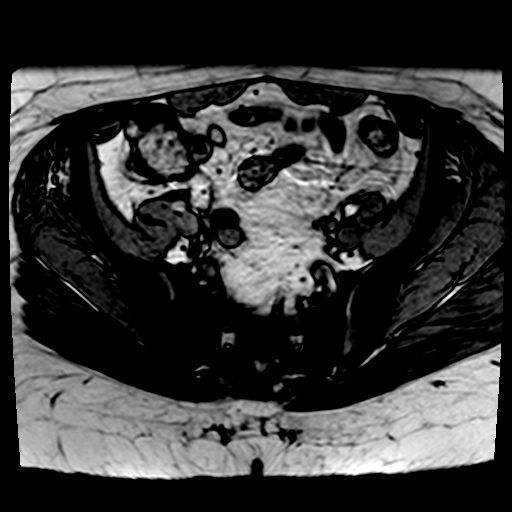
[im 76/76]
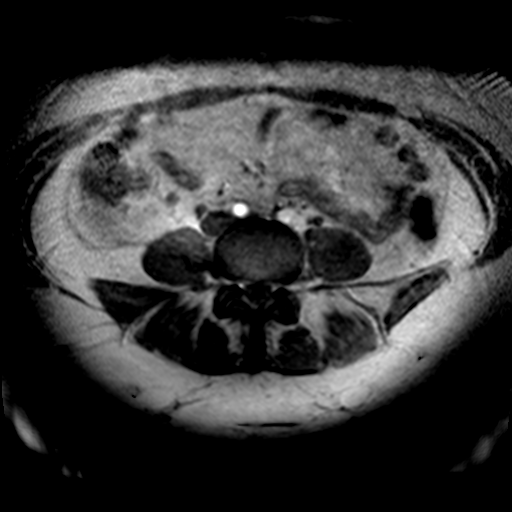

[Series 8: T1 fat-sat · axial · non-contrast · 5.0mm · 0.36mm/px · z∈[-65,+156]mm · 4 of 76 slices shown]
[im 1/76]
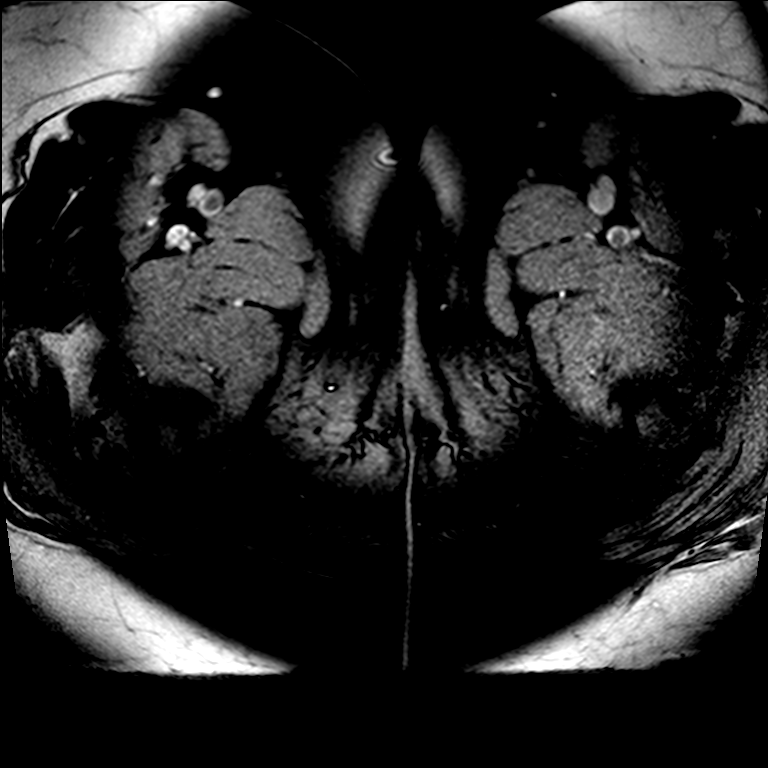
[im 26/76]
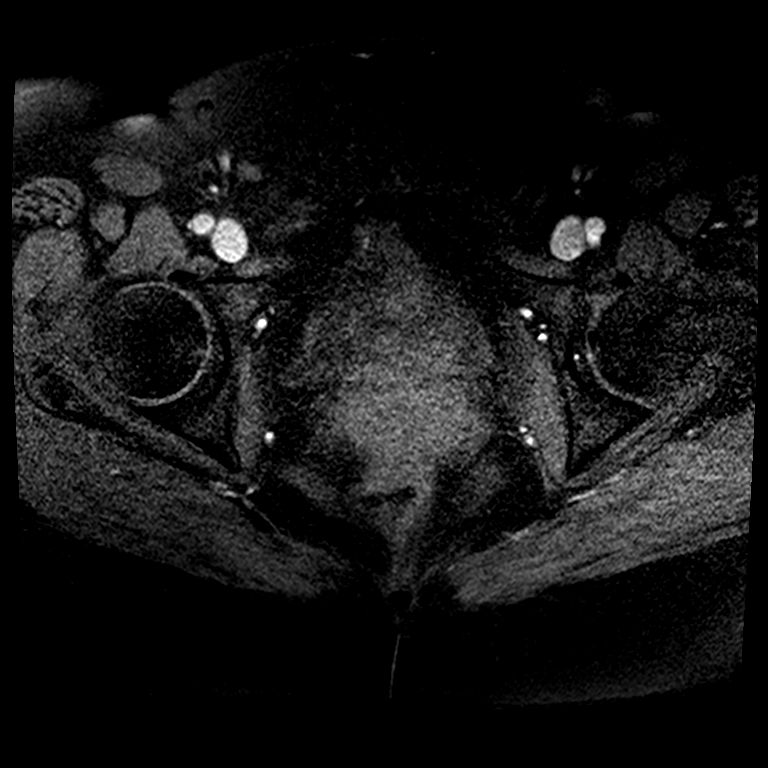
[im 51/76]
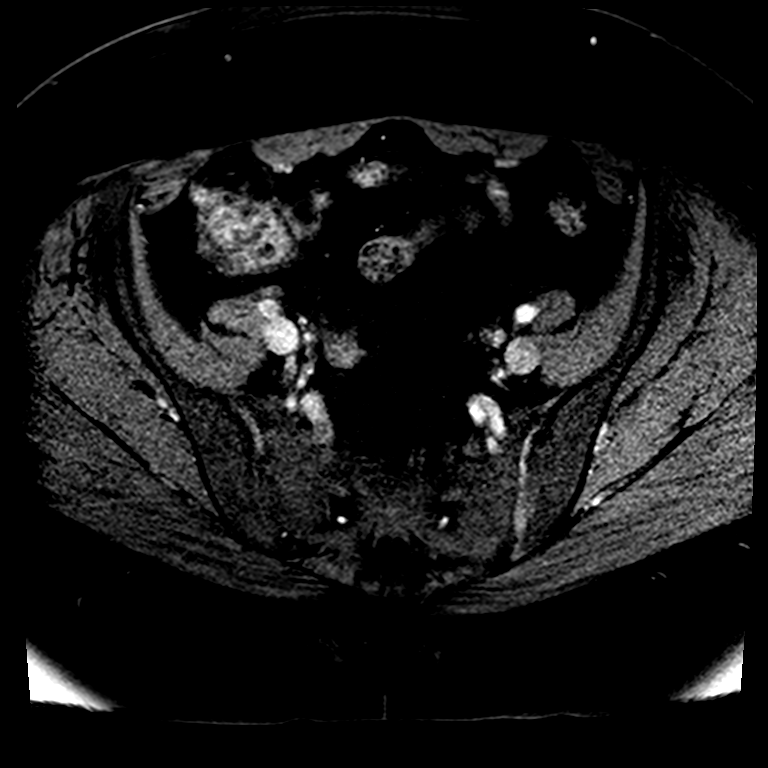
[im 76/76]
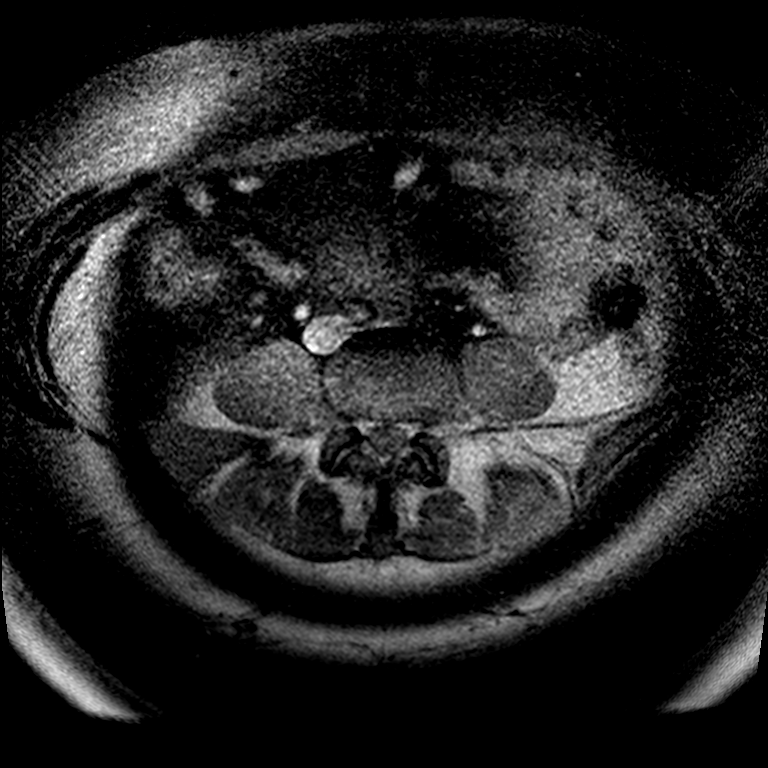

[Series 9: axial dynamic pre · axial · non-contrast · 4.0mm · 0.88mm/px · z∈[-57,+162]mm · 3 of 56 slices shown]
[im 1/56]
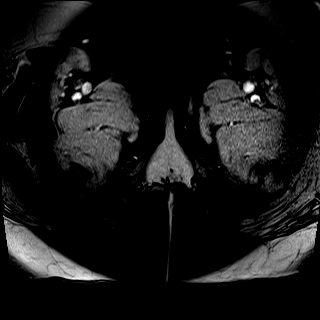
[im 28/56]
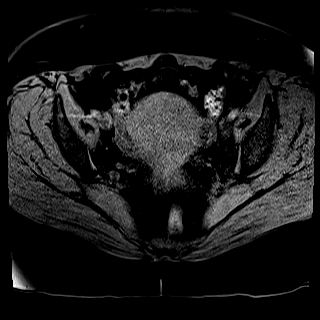
[im 56/56]
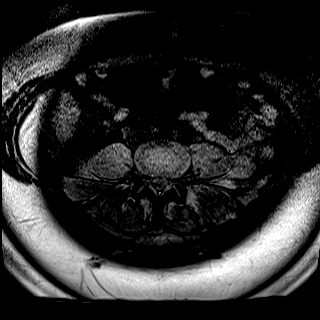

[Series 10: axial dynamic post · axial · 4.0mm · 0.88mm/px · z∈[-57,+162]mm · 3 of 56 slices shown]
[im 1/56]
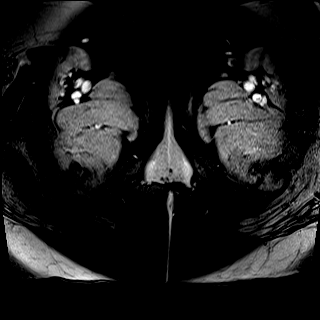
[im 28/56]
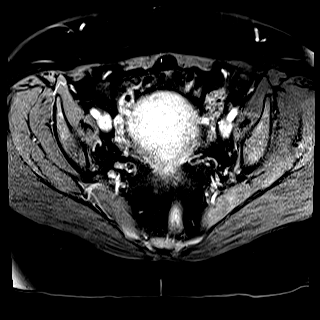
[im 56/56]
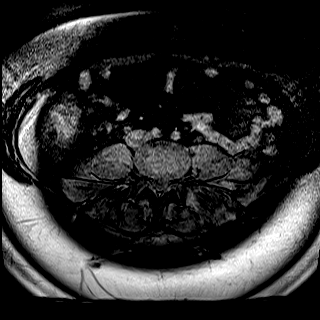

[21 of 48 positions shown; findings below may reference images not displayed]

FINDINGS: Urinary Tract:  No abnormality visualized.

Bowel:  Unremarkable visualized pelvic bowel loops.

Vascular/Lymphatic: No pathologically enlarged lymph nodes. No
significant vascular abnormality seen.

Reproductive: Uterine fibroids, most notably a subserosal fibroid of
the posterior uterine fundus. There are no fibroids with significant
submucosal component identified. Abnormally thickened postmenopausal
endometrium measuring at least 1.6 cm with hemorrhagic or
proteinaceous fluid in the endometrial cavity. Multiple large
nabothian cysts of the cervix. Simple, benign subcentimeter ovarian
cysts.

Other:  None.

Musculoskeletal: No suspicious bone lesions identified.
IMPRESSION: 1. Abnormally thickened postmenopausal endometrium measuring at
least 1.6 cm with hemorrhagic or proteinaceous fluid in the
endometrial cavity. Findings are suspicious for endometrial
hyperplasia or carcinoma. Consider tissue sampling.
2. No evidence of pelvic lymphadenopathy or metastatic disease.
3. No evidence of colon malignancy in the pelvis per report of
recent diagnosis of colon cancer.
4. Uterine fibroids.

These results will be called to the ordering clinician or
representative by the Radiologist Assistant, and communication
documented in the PACS or [REDACTED].

## 2021-04-24 MED ORDER — GADOBUTROL 1 MMOL/ML IV SOLN
9.0000 mL | Freq: Once | INTRAVENOUS | Status: AC | PRN
  Administered 2021-04-24: 14:00:00 9 mL via INTRAVENOUS

## 2021-04-24 NOTE — Progress Notes (Signed)
Patient will be seen today as a new patient for new diagnosis of colorectal cancer. Pathology still not received from referring MD office.   Called [facility] at [phone] and requested path report. Path report has not yet been signed out by the physician, but they will send preliminary report.   Pathology report obtained. Will place copy in scan bin.  Oncology Nurse Navigator Documentation  Oncology Nurse Navigator Flowsheets 04/24/2021  Abnormal Finding Date -  Confirmed Diagnosis Date 04/18/2021  Diagnosis Status Confirmed Diagnosis Complete  Navigator Follow Up Date: 04/25/2021  Navigator Follow Up Reason: Appointment Review;Review Note  Production assistant, radio Encounter Type Pathology Review  Patient Visit Type MedOnc  Treatment Phase -  Barriers/Navigation Needs Coordination of Care;Education  Education -  Interventions Coordination of Care  Acuity Level 2-Minimal Needs (1-2 Barriers Identified)  Coordination of Care Pathology  Education Method -  Time Spent with Patient 30

## 2021-04-24 NOTE — Progress Notes (Signed)
Referral MD  Reason for Referral: Adenocarcinoma the ascending colon --questionable liver mets  Chief Complaint  Patient presents with   New Patient (Initial Visit)  : I was found to have cancer.  HPI: Rebecca Bullock is a very nice 55 year old white female.  She is originally from [place].  She has been down in [place] for 30 years.  She has 3 children.  She works for a [employer], [employer].  She was recently seen with iron deficiency anemia.  She had never had a colonoscopy.  She is yet to have a mammogram.  She got IV iron.  This worked very well for her.  However, she did have a colonoscopy that was done.  This was done on 04/18/2021.  Unfortunate, this showed a mass in the ascending colon.  This was biopsied.  The pathology report ([id number]) showed an invasive moderately differentiated adenocarcinoma.  She did have a CT scan that was done.  Surprisingly, this showed a 7.7 cm lesion of the right lobe of the liver.  Again this was nonspecific.  Also noted was a 1.4 cm splenic lesion.  She has a small hiatal hernia.  She is post to have an MRI to see exactly what might be going on with the liver.  She has lost some weight.  She is lost about 20 pounds.  She has had no abdominal pain.  There is no bleeding.  She has never had any change in bowel or bladder habits..  There is a's extensive history of cancer in the family.  There is no history of colon cancer.  She has had no history of tobacco use.  She has rare alcohol use.  Currently, I would say her performance status is probably ECOG 0.   History reviewed. No pertinent past medical history.:   Past Surgical History:  Procedure Laterality Date   uterine ablation    :   Current Outpatient Medications:    albuterol (VENTOLIN HFA) 108 (90 Base) MCG/ACT inhaler, Inhale 2 puffs into the lungs every 6 (six) hours as needed. (Patient taking differently: Inhale 2 puffs into the lungs every 6 (six) hours as needed for shortness of  breath or wheezing.), Disp: 18 g, Rfl: 0   betamethasone valerate (VALISONE) 0.1 % cream, Apply 1 application topically See admin instructions. Apply 1 application topically every other day as needed for dermatitis, Disp: , Rfl:    estradiol (ESTRACE) 0.1 MG/GM vaginal cream, Apply a pea-sized amount externally and 0.5 grams twice a week in the morning, Disp: , Rfl:    fluticasone (FLONASE) 50 MCG/ACT nasal spray, Place 2 sprays into both nostrils daily., Disp: 16 g, Rfl: 6   folic acid (FOLVITE) 1 MG tablet, Take 1 tablet (1 mg total) by mouth daily., Disp: 100 tablet, Rfl:    ibuprofen (ADVIL) 800 MG tablet, Take 800 mg by mouth every 8 (eight) hours as needed for mild pain., Disp: , Rfl:    iron polysaccharides (NIFEREX) 150 MG capsule, Take 1 capsule (150 mg total) by mouth daily., Disp: 100 capsule, Rfl: 2   montelukast (SINGULAIR) 10 MG tablet, Take 1 tablet (10 mg total) by mouth at bedtime., Disp: 90 tablet, Rfl: 3   Multiple Vitamins-Minerals (MULTIVITAMIN WITH MINERALS) tablet, Take 1 tablet by mouth daily., Disp: 100 tablet, Rfl: 0   pantoprazole (PROTONIX) 40 MG tablet, Take 1 tablet (40 mg total) by mouth daily., Disp: 30 tablet, Rfl: 3   vitamin B-12 (CYANOCOBALAMIN) 1000 MCG tablet, Take 1 tablet (1,000 mcg  total) by mouth daily., Disp: 100 tablet, Rfl: 0:  :   Allergies  Allergen Reactions   Metronidazole     Other reaction(s): GI Upset (intolerance)  :   Family History  Problem Relation Age of Onset   Cancer Mother    Cancer Father    Multiple sclerosis Maternal Aunt    Alcohol abuse Maternal Uncle    Diabetes Maternal Grandmother   :   Social History   Socioeconomic History   Marital status: Single    Spouse name: Not on file   Number of children: Not on file   Years of education: Not on file   Highest education level: Not on file  Occupational History   Not on file  Tobacco Use   Smoking status: Never   Smokeless tobacco: Never  Vaping Use   Vaping  Use: Never used  Substance and Sexual Activity   Alcohol use: Yes    Comment: occasionally   Drug use: Not Currently   Sexual activity: Not on file  Other Topics Concern   Not on file  Social History Narrative   Not on file   Social Determinants of Health   Financial Resource Strain: Not on file  Food Insecurity: Not on file  Transportation Needs: Not on file  Physical Activity: Not on file  Stress: Not on file  Social Connections: Not on file  Intimate Partner Violence: Not on file  : Review of Systems  Constitutional:  Positive for weight loss.  HENT: Negative.    Eyes: Negative.   Respiratory: Negative.    Cardiovascular: Negative.   Gastrointestinal: Negative.   Genitourinary: Negative.   Musculoskeletal: Negative.   Skin: Negative.   Neurological: Negative.   Endo/Heme/Allergies: Negative.   Psychiatric/Behavioral: Negative.      Exam: @IPVITALS @ Physical Exam Vitals reviewed.  HENT:     Head: Normocephalic and atraumatic.  Eyes:     Pupils: Pupils are equal, round, and reactive to light.  Cardiovascular:     Rate and Rhythm: Normal rate and regular rhythm.     Heart sounds: Normal heart sounds.  Pulmonary:     Effort: Pulmonary effort is normal.     Breath sounds: Normal breath sounds.  Abdominal:     General: Bowel sounds are normal.     Palpations: Abdomen is soft.  Musculoskeletal:        General: No tenderness or deformity. Normal range of motion.     Cervical back: Normal range of motion.  Lymphadenopathy:     Cervical: No cervical adenopathy.  Skin:    General: Skin is warm and dry.     Findings: No erythema or rash.  Neurological:     Mental Status: She is alert and oriented to person, place, and time.  Psychiatric:        Behavior: Behavior normal.        Thought Content: Thought content normal.        Judgment: Judgment normal.    Recent Labs    04/24/21 1036  WBC 6.7  HGB 9.7*  HCT 33.4*  PLT 280    Recent Labs     04/24/21 1207  NA 141  K 4.3  CL 104  CO2 30  GLUCOSE 102*  BUN 5*  CREATININE 0.61  CALCIUM 9.4    Blood smear review: None  Pathology: See above    Assessment and Plan: Rebecca Bullock is a very charming 55 year old white female.  She has an adenocarcinoma of the  ascending colon.  I think the real question now is whether or not she has metastasis.  We did send off a CEA level on her.  Her liver function studies are normal.  I would be very surprised if this lesion in the liver is metastatic disease.  I just think would be unusual for her to have such a large lesion in the liver without having a lot of symptoms.  I think if we do not find that this liver lesion is malignant, then I would get her to surgery to resect out the primary and then see about adjuvant therapy.  I do think it would not be a bad idea to get a PET scan on her.  Maybe this can show [place] what is going on with the liver.  She is very nice.  She is in great shape.  I think even if she does have metastasis to the liver, it is a solitary lesion.  I would seriously consider resecting this out if we get a good response to chemotherapy.  We will have to see about sending off the tumor for molecular studies.  Given that there is a strong history of cancer in the family, there may be a genetic component to her cancer.  She does have 3 children.  I would definitely recommend getting them to have colonoscopies by the time that they are 53 years old.  She is never had a mammogram.  We need to make sure she gets a mammogram.  Once she has her results back from the MRI and the PET scan, then we will see how we need to proceed with therapy.

## 2021-04-25 ENCOUNTER — Telehealth: Payer: Self-pay | Admitting: Family

## 2021-04-25 ENCOUNTER — Encounter: Payer: Self-pay | Admitting: Genetic Counselor

## 2021-04-25 ENCOUNTER — Telehealth: Payer: Self-pay

## 2021-04-25 ENCOUNTER — Encounter: Payer: Self-pay | Admitting: *Deleted

## 2021-04-25 DIAGNOSIS — C182 Malignant neoplasm of ascending colon: Secondary | ICD-10-CM

## 2021-04-25 DIAGNOSIS — C189 Malignant neoplasm of colon, unspecified: Secondary | ICD-10-CM

## 2021-04-25 LAB — IRON AND TIBC
Iron: 21 ug/dL — ABNORMAL LOW (ref 28–170)
Saturation Ratios: 5 % — ABNORMAL LOW (ref 10.4–31.8)
TIBC: 385 ug/dL (ref 250–450)
UIBC: 364 ug/dL

## 2021-04-25 LAB — FERRITIN: Ferritin: 13 ng/mL (ref 11–307)

## 2021-04-25 NOTE — Telephone Encounter (Signed)
-----   Message from [name], MD sent at 04/25/2021 11:12 AM EST ----- Please call her and tell her that the iron is still very low.  We need to give her another dose of IV iron.  However, we cannot do this until after she has the MRI next week.

## 2021-04-25 NOTE — Progress Notes (Signed)
FYI Dr. [name]  possible endometrial carcinoma. Referral placed for GYN to biopsy

## 2021-04-25 NOTE — Progress Notes (Signed)
Patient had her new patient appointment with this navigator out of the office.   Initial RN Navigator Patient Visit  Name: Rebecca Bullock Diagnosis: Adenocarcinoma of ascending colon  Patient completed visit with Dr. [name] and completed an MRI yesterday.   MRI of pelvis shows questionable endometrial cancer. PCP has already referred patient to GYN for possible biopsy.   Patient has MRI of abdomen to evaluate liver lesion on 05/01/21 and PET scan 05/02/2021.  Patient has a family history of cancer. Mother with Lung, MGF with Pancreatic, and maternal uncle with something believed to be GI related. Message sent to genetics to determine eligibility for genetic testing.   Patient will need upfront surgery if MRI of liver shows benign process. Referral placed to start scheduling process.   Called and spoke to patient. She understands all follow up procedures and expectations. They have my number to reach out for any further clarification or additional needs.    Oncology Nurse Navigator Documentation  Oncology Nurse Navigator Flowsheets 04/25/2021  Abnormal Finding Date -  Confirmed Diagnosis Date -  Diagnosis Status -  Navigator Follow Up Date: 05/01/2021  Navigator Follow Up Reason: Scan Review  Navigator Location [facility]  Navigator Encounter Type Appt/Treatment Plan Review;Telephone;Scan Review  Telephone Appt Confirmation/Clarification;Education;Outgoing Call  Patient Visit Type MedOnc  Treatment Phase Pre-Tx/Tx Discussion  Barriers/Navigation Needs Coordination of Care;Education  Education Other  Interventions Coordination of Care;Education;Psycho-Social Support;Referrals  Acuity Level 2-Minimal Needs (1-2 Barriers Identified)  Referrals Other  Coordination of Care Other  Education Method Verbal  Support Groups/Services Friends and Family  Time Spent with Patient 55

## 2021-04-25 NOTE — Telephone Encounter (Signed)
Called and LM informing patient of lab results. OK per DPR. Requested a call back with any further questions or concerns. Message sent to scheduling for appointment.

## 2021-04-25 NOTE — Progress Notes (Unsigned)
Urgent referral made for endometrial biopsy.

## 2021-04-26 ENCOUNTER — Telehealth: Payer: Self-pay

## 2021-04-26 LAB — CEA (IN HOUSE-CHCC): CEA (CHCC-In House): 7 ng/mL — ABNORMAL HIGH (ref 0.00–5.00)

## 2021-04-26 NOTE — Telephone Encounter (Signed)
-----   Message from [name], MD sent at 04/26/2021  3:35 PM EST ----- Let her know that the CEA tumor level is really not that high.  As such, I really do not think that she has any tumor in her liver.  Rebecca Bullock

## 2021-04-26 NOTE — Telephone Encounter (Signed)
Called and informed patient of results, patient verbalized understanding and denies any questions or concerns at this time.  ? ?

## 2021-04-27 ENCOUNTER — Telehealth: Payer: Self-pay | Admitting: *Deleted

## 2021-04-27 NOTE — Telephone Encounter (Signed)
No 04/24/21 LOS

## 2021-05-01 ENCOUNTER — Ambulatory Visit: Payer: 59

## 2021-05-01 ENCOUNTER — Other Ambulatory Visit: Payer: Self-pay

## 2021-05-01 DIAGNOSIS — K769 Liver disease, unspecified: Secondary | ICD-10-CM

## 2021-05-01 IMAGING — MR MR ABDOMEN WO/W CM
16 of 17 series · 45 of 48 positions shown · IV contrast (gadavist)
Comparison: CT chest, [DATE]

CLINICAL DATA: Follow-up liver and splenic lesions incidentally
identified by CT chest, recent diagnosis colon cancer

EXAM:
MRI ABDOMEN WITHOUT AND WITH CONTRAST
TECHNIQUE: Multiplanar multisequence MR imaging of the abdomen was performed
both before and after the administration of intravenous contrast.
CONTRAST:  9mL GADAVIST GADOBUTROL 1 MMOL/ML IV SOLN

[Series 4: cor ssfse / · coronal · 7.0mm · 1.41mm/px · 2 of 33 slices shown]
[im 1/33]
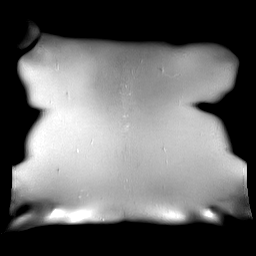
[im 33/33]
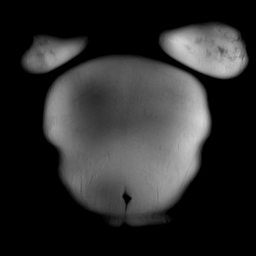

[Series 5: T2 fat-sat · axial · 7.0mm · 1.41mm/px · z∈[-215,+79]mm · 2 of 36 slices shown]
[im 1/36]
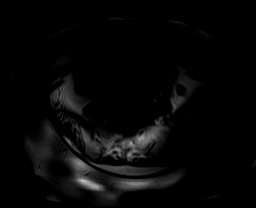
[im 36/36]
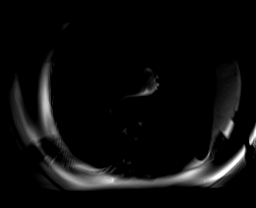

[Series 6: T1 · axial · 7.0mm · 0.74mm/px · z∈[-226,+67]mm · 4 of 72 slices shown]
[im 1/72]
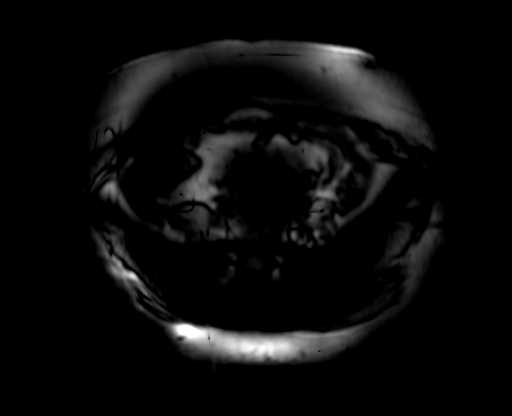
[im 24/72]
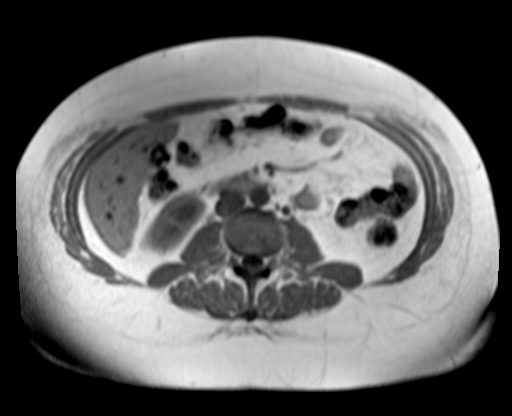
[im 48/72]
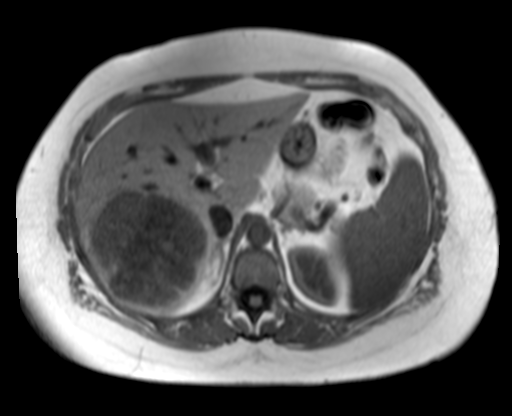
[im 72/72]
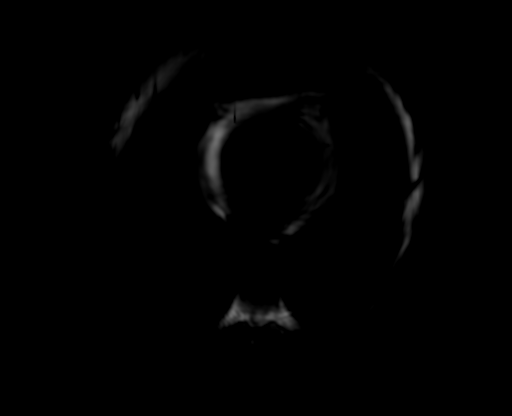

[Series 8: DWI · axial · 7.0mm · 2.00mm/px · z∈[-214,+80]mm · 5 of 108 slices shown (1 of 2)]
[im 1/108]
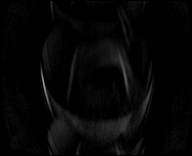
[im 27/108]
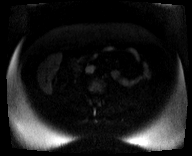
[im 54/108]
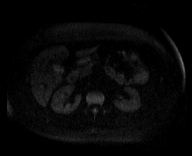
[im 81/108]
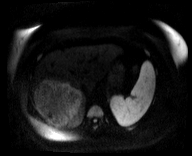
[im 108/108]
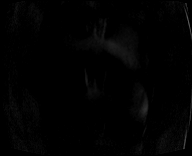

[Series 9: DWI · axial · 7.0mm · 2.00mm/px · z∈[-214,+80]mm · 2 of 36 slices shown (2 of 2)]
[im 1/36]
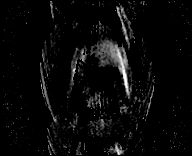
[im 36/36]
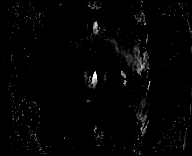

[Series 10: bSSFP · axial · 7.0mm · 0.74mm/px · z∈[-221,+72]mm · 2 of 36 slices shown]
[im 1/36]
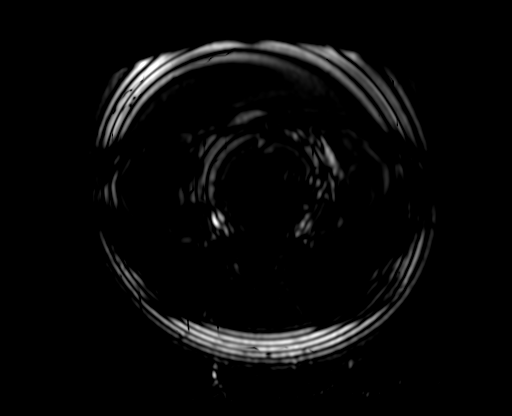
[im 36/36]
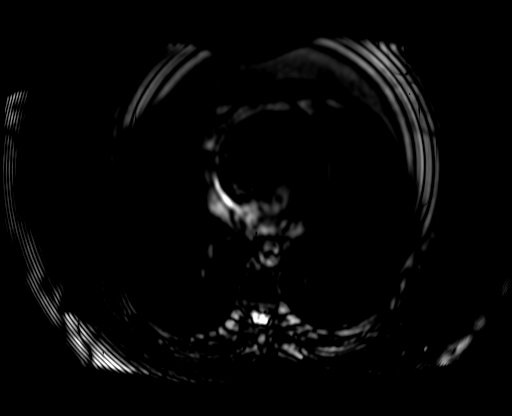

[Series 11: axial dynamic pre · axial · non-contrast · 3.5mm · 1.19mm/px · z∈[-228,+76]mm · 3 of 88 slices shown]
[im 1/88]
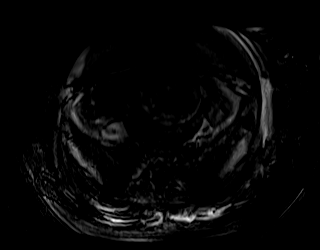
[im 44/88]
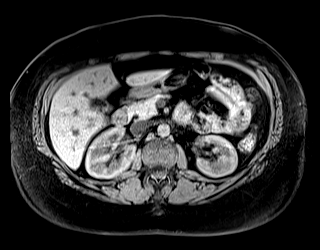
[im 88/88]
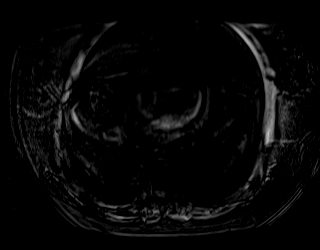

[Series 12: axial dynamic post · axial · 3.5mm · 1.19mm/px · z∈[-228,+76]mm · 3 of 88 slices shown (1 of 6)]
[im 1/88]
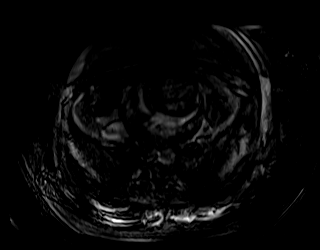
[im 44/88]
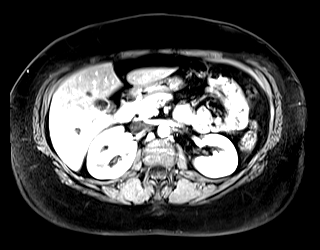
[im 88/88]
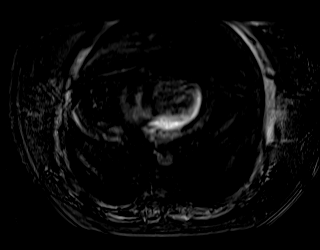

[Series 13: axial dynamic post · axial · 3.5mm · 1.19mm/px · z∈[-228,+76]mm · 3 of 88 slices shown (2 of 6)]
[im 1/88]
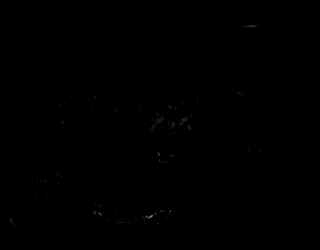
[im 44/88]
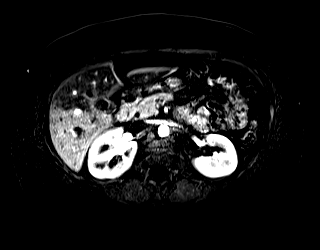
[im 88/88]
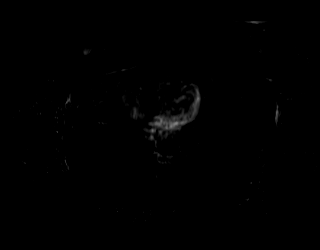

[Series 14: axial dynamic post · axial · 3.5mm · 1.19mm/px · z∈[-228,+76]mm · 3 of 88 slices shown (3 of 6)]
[im 1/88]
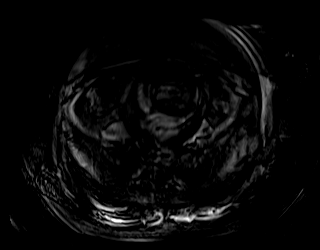
[im 44/88]
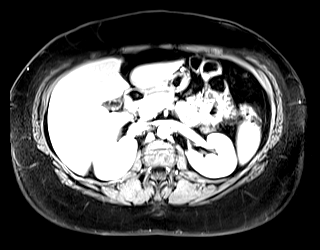
[im 88/88]
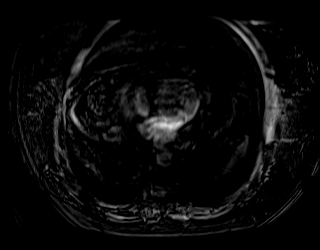

[Series 15: axial dynamic post · axial · 3.5mm · 1.19mm/px · z∈[-228,+76]mm · 3 of 88 slices shown (4 of 6)]
[im 1/88]
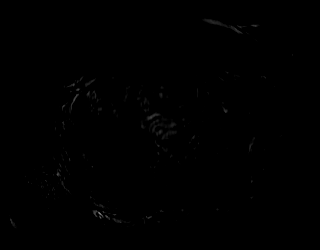
[im 44/88]
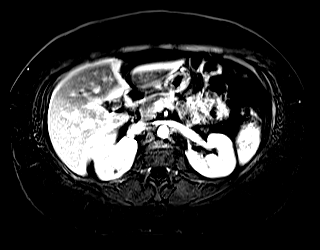
[im 88/88]
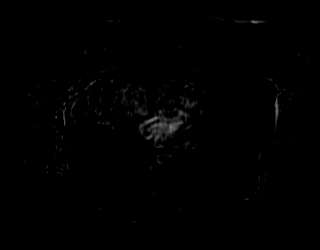

[Series 16: axial dynamic post · axial · 3.5mm · 1.19mm/px · z∈[-228,+76]mm · 3 of 88 slices shown (5 of 6)]
[im 1/88]
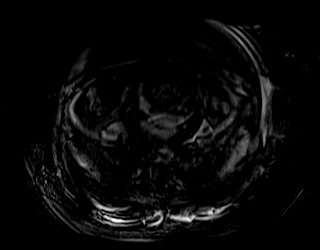
[im 44/88]
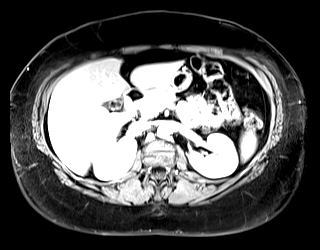
[im 88/88]
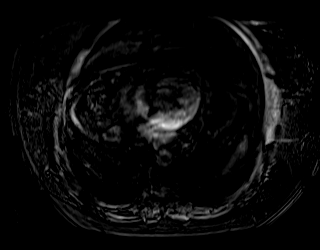

[Series 17: axial dynamic post · axial · 3.5mm · 1.19mm/px · z∈[-228,+76]mm · 3 of 88 slices shown (6 of 6)]
[im 1/88]
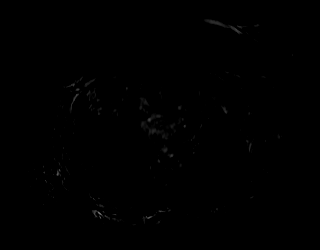
[im 44/88]
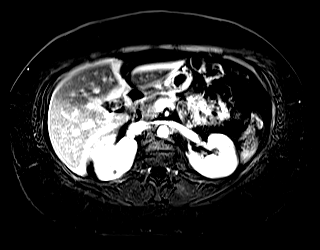
[im 88/88]
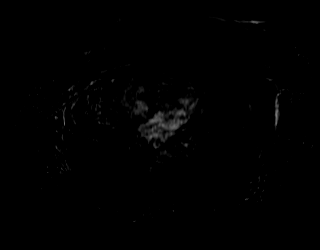

[Series 19: axial ssfse / · axial · 7.0mm · 1.12mm/px · 1 of 36 slices shown]
[im 1/36]
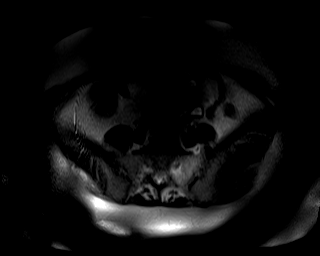

[Series 20: axial dynamic delayed · axial · 3.5mm · 1.19mm/px · z∈[-228,+76]mm · 3 of 88 slices shown]
[im 1/88]
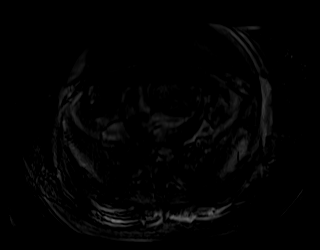
[im 44/88]
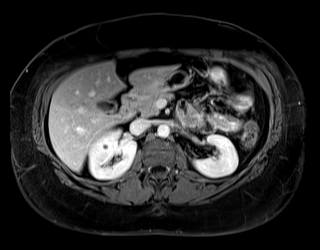
[im 88/88]
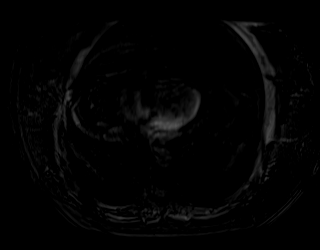

[Series 21: axial dynamic delayed_sub · axial · 3.5mm · 1.19mm/px · z∈[-228,+76]mm · 3 of 88 slices shown]
[im 1/88]
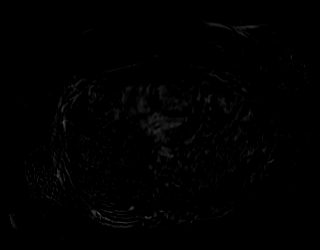
[im 44/88]
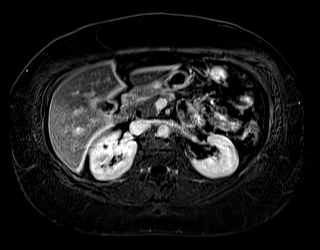
[im 88/88]
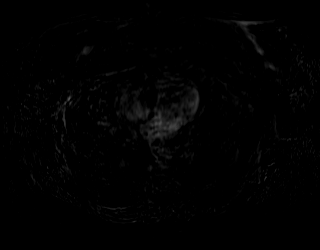

[45 of 48 positions shown; findings below may reference images not displayed]

FINDINGS: Lower chest: No acute findings.

Hepatobiliary: There is a large, heterogeneously contrast enhancing
mass of the posterior right lobe of the liver, hepatic segments
VI/VII, measuring 9.4 x 9.2 cm (series 20, image 30). Segmental
biliary dilatation in the inferior tip of the right lobe of the
liver, hepatic segment VI (series 12, image 56). Multiple additional
fluid signal simple cysts throughout the liver parenchyma. Small
gallstones in the gallbladder. No biliary ductal dilatation.

Pancreas: No mass, inflammatory changes, or other parenchymal
abnormality identified.

Spleen: Within normal limits in size and appearance. There is a
lobulated, thinly septated fluid signal subcapsular lesion of the
superior spleen measuring 1.6 x 1.4 cm (series 4, image 14, series
20, image 14).

Adrenals/Urinary Tract: No masses identified. No evidence of
hydronephrosis.

Stomach/Bowel: Visualized portions within the abdomen are
unremarkable.

Vascular/Lymphatic: There is a heterogeneously contrast enhancing
aortocaval node measuring 2.1 x 1.9 cm (series 20, image 67). No
abdominal aortic aneurysm demonstrated.

Other:  None.

Musculoskeletal: No suspicious bone lesions identified.
IMPRESSION: 1. There is a large, heterogeneously contrast enhancing mass of the
posterior right lobe of the liver, measuring 9.4 x 9.2 cm, most
consistent with a large hepatic metastasis given recent diagnosis of
colon cancer. Although not favored, general differential
considerations include cholangiocarcinoma and hepatocellular
carcinoma.
2. There is a heterogeneously contrast enhancing aortocaval node
measuring 2.1 x 1.9 cm, suspicious for a necrotic nodal metastasis.
3. Lobulated, thinly septated fluid signal subcapsular lesion of the
superior spleen measuring 1.6 x 1.4 cm, most consistent with a
benign splenic cyst.
4. Cholelithiasis.

These results will be called to the ordering clinician or
representative by the Radiologist Assistant, and communication
documented in the PACS or [REDACTED].

## 2021-05-01 MED ORDER — GADOBUTROL 1 MMOL/ML IV SOLN
9.0000 mL | Freq: Once | INTRAVENOUS | Status: AC | PRN
  Administered 2021-05-01: 9 mL via INTRAVENOUS

## 2021-05-02 ENCOUNTER — Encounter: Payer: Self-pay | Admitting: *Deleted

## 2021-05-02 ENCOUNTER — Ambulatory Visit (HOSPITAL_COMMUNITY)
Admission: RE | Admit: 2021-05-02 | Discharge: 2021-05-02 | Disposition: A | Discharge status: Home / Self Care | Payer: 59 | Source: Ambulatory Visit | Attending: Hematology & Oncology | Admitting: Hematology & Oncology

## 2021-05-02 DIAGNOSIS — C189 Malignant neoplasm of colon, unspecified: Secondary | ICD-10-CM | POA: Diagnosis present

## 2021-05-02 DIAGNOSIS — C787 Secondary malignant neoplasm of liver and intrahepatic bile duct: Secondary | ICD-10-CM | POA: Diagnosis present

## 2021-05-02 LAB — GLUCOSE, CAPILLARY: Glucose-Capillary: 87 mg/dL (ref 70–99)

## 2021-05-02 IMAGING — CT NM PET TUM IMG INITIAL (PI) SKULL BASE T - THIGH
7 series · 25 of 25 positions shown · non-contrast
Comparison: MRI [DATE].  Chest CT  [DATE]

CLINICAL DATA: Initial treatment strategy for colorectal cancer.

EXAM:
NUCLEAR MEDICINE PET SKULL BASE TO THIGH
TECHNIQUE: 9.62 mCi F-18 FDG was injected intravenously. Full-ring PET imaging
was performed from the skull base to thigh after the radiotracer. CT
data was obtained and used for attenuation correction and anatomic
localization.
Fasting blood glucose: 87 mg/dl

[Series 3: pet sk_thigh ac · axial · 5.0mm · 4.07mm/px · z∈[-1466,-574]mm · 6 of 224 slices shown]
[im 1/224]
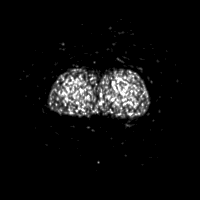
[im 45/224]
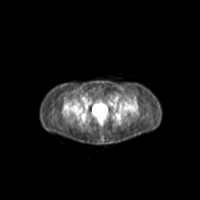
[im 90/224]
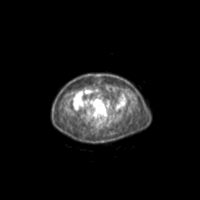
[im 134/224]
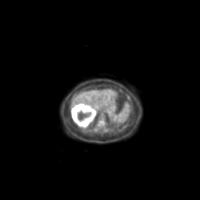
[im 179/224]
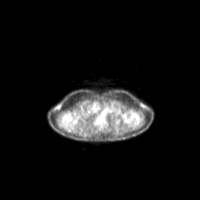
[im 224/224]
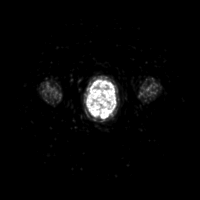

[Series 4: ct sk_thigh 5.0 bf37 · axial · 5.0mm · 0.98mm/px · z∈[-1466,-574]mm · 6 of 224 slices shown]
[im 1/224]
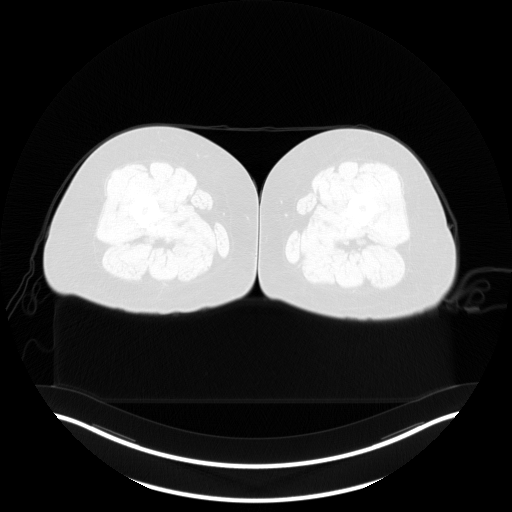
[im 45/224]
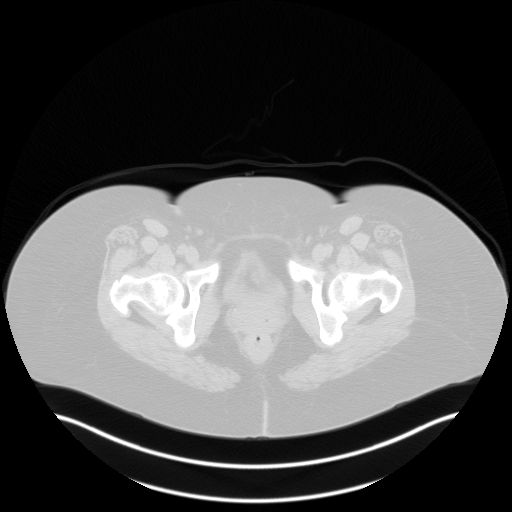
[im 90/224]
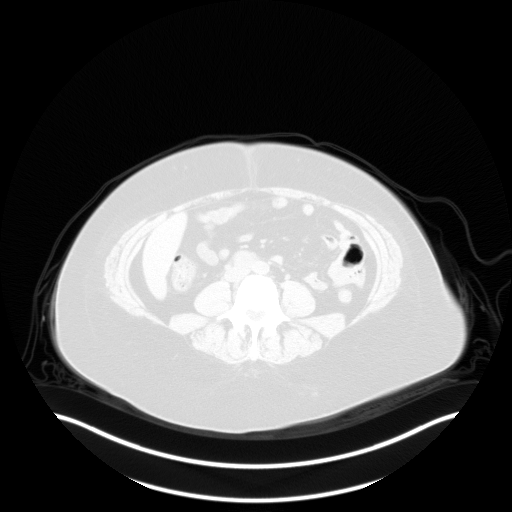
[im 134/224]
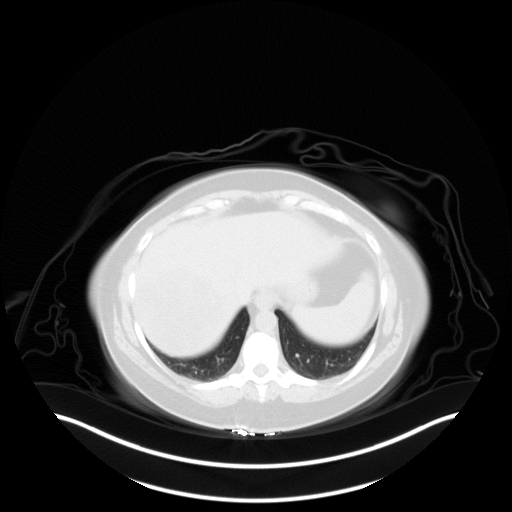
[im 179/224]
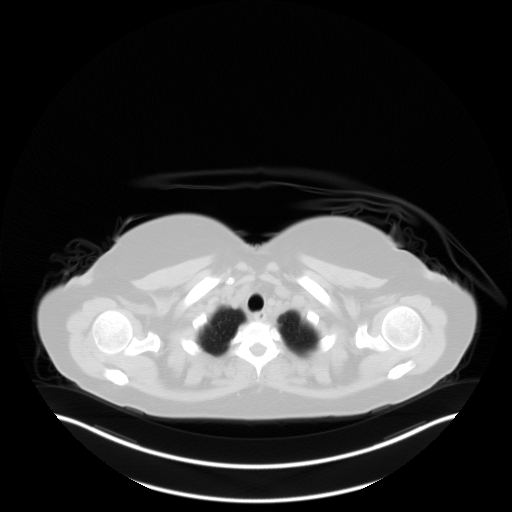
[im 224/224  brain]
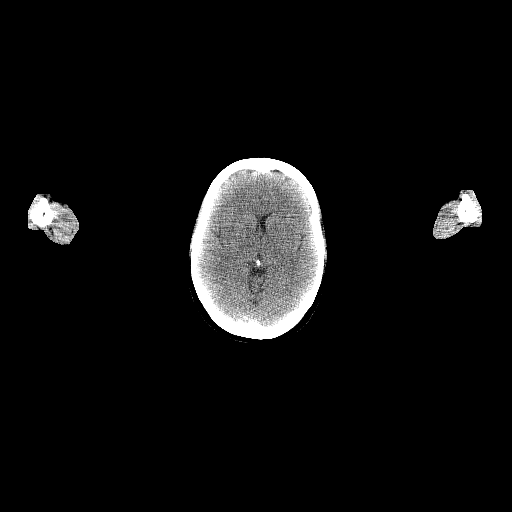

[Series 5: pet sk_thigh nac · axial · 5.0mm · 4.07mm/px · z∈[-1466,-574]mm · 5 of 224 slices shown]
[im 1/224]
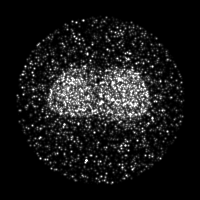
[im 56/224]
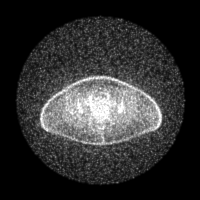
[im 112/224]
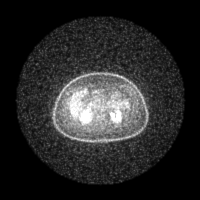
[im 168/224]
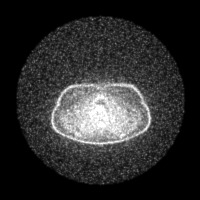
[im 224/224]
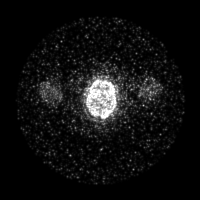

[Series 8: ct sk_thigh 5.0 (id) lung_bone · axial · 5.0mm · 0.58mm/px · 1 of 61 slices shown]
[im 1/61]
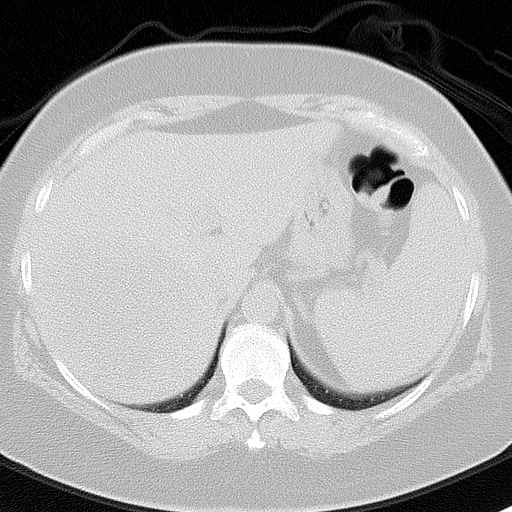

[Series 603: fused cor · 1 of 52 slices shown]
[im 1/52]
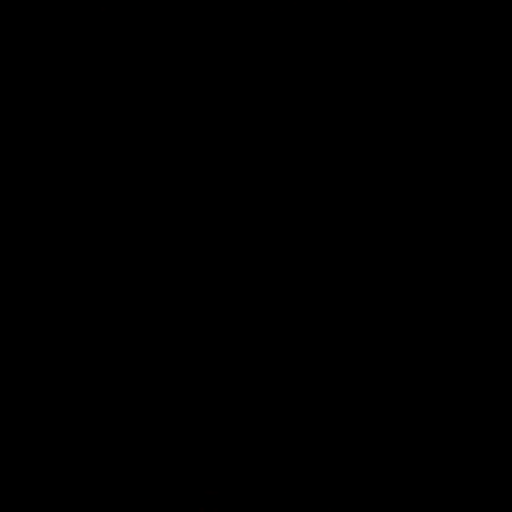

[Series 604: <mip collection> · coronal · 1.85mm/px · 1 of 32 slices shown]
[im 1/32]
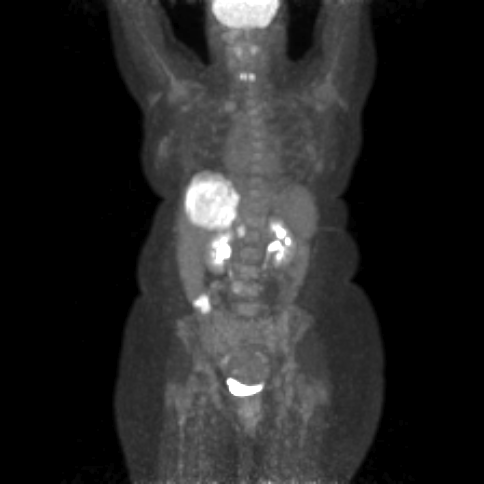

[Series 605: range-ct sk_thigh 5.0 bf37-tra-<alpha range> · 5 of 217 slices shown]
[im 1/217]
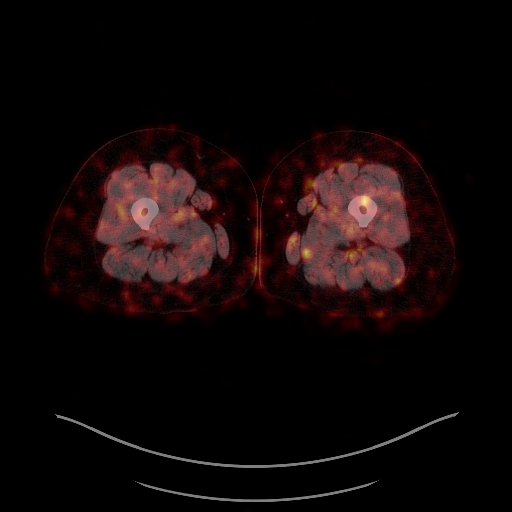
[im 55/217]
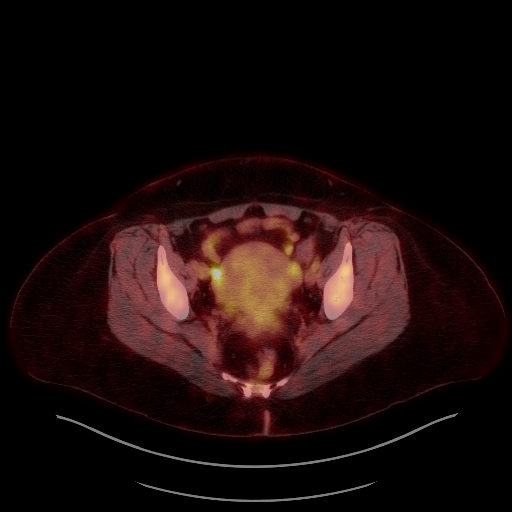
[im 109/217]
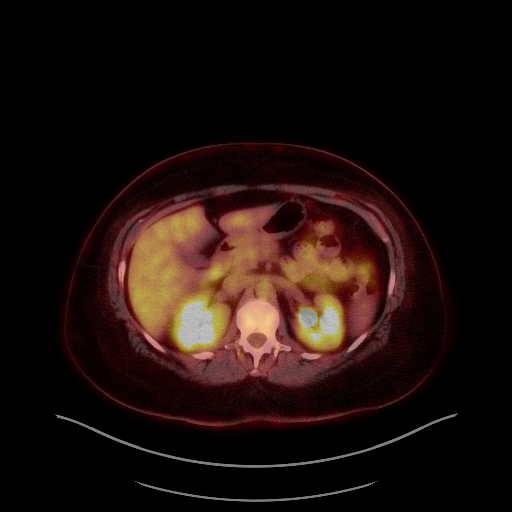
[im 163/217]
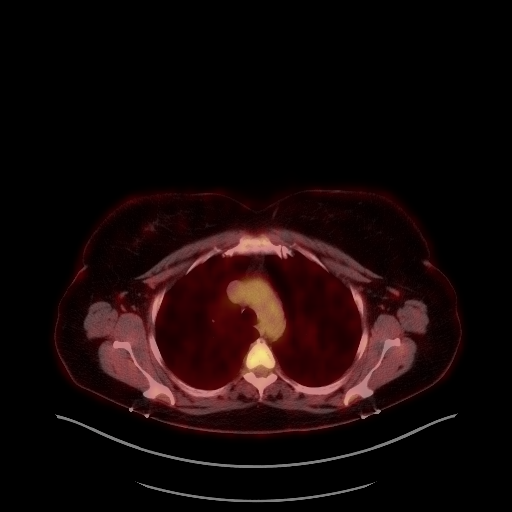
[im 217/217]
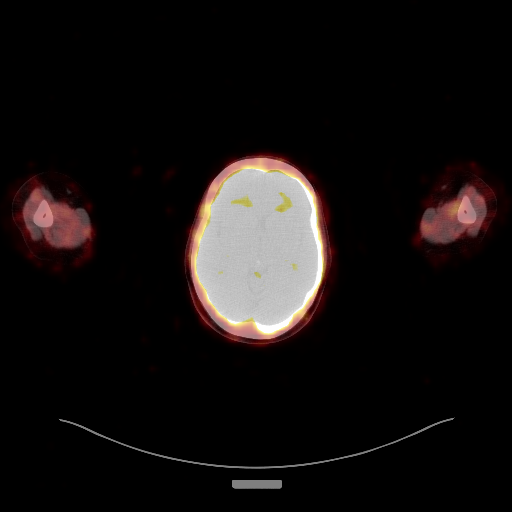

[25 of 25 positions shown; findings below may reference images not displayed]

FINDINGS: Mediastinal blood pool activity: SUV max

Liver activity: SUV max NA

NECK: No hypermetabolic lymph nodes in the neck.

Incidental CT findings: Chronic appearing right maxillary sinus
disease is noted with mild hypermetabolism.

CHEST: No hypermetabolic mediastinal or hilar nodes. No suspicious
pulmonary nodules on the CT scan.

Incidental CT findings: none

ABDOMEN/PELVIS: The 10 cm right hepatic lobe mass is markedly
hypermetabolic with SUV max of 13.31. Central necrosis is noted. No
other hepatic lesions are identified (other than a small segment 6
cyst).

9 mm hepatoduodenal ligament node is hypermetabolic with SUV max of
11.27.

Lower retroperitoneal node measures 13.5 mm and SUV max is 7.24.

The patient's ascending colon cancer is hypermetabolic with SUV max
of 20.27. Ileocolic nodes not show any definite hypermetabolism.

No findings for adrenal gland metastasis.

Incidental CT findings: 15 mm rim calcified gallstone in the
gallbladder along with other smaller gallstones. Mildly enlarged
fibroid uterus. Simple 2 cm right ovarian cyst. No follow-up is
necessary.

SKELETON: No focal hypermetabolic activity to suggest skeletal
metastasis.

Incidental CT findings: none
IMPRESSION: 1. Hypermetabolic ascending colon lesion consistent with known
primary colonic neoplasm.
2. 10 cm metastatic lesion in the right hepatic lobe.
3. Enlarged and hypermetabolic abdominal lymph nodes.
4. No findings for metastatic disease involving the chest or bony
structures.

## 2021-05-02 MED ORDER — FLUDEOXYGLUCOSE F - 18 (FDG) INJECTION
9.6000 | Freq: Once | INTRAVENOUS | Status: AC | PRN
  Administered 2021-05-02: 9.62 via INTRAVENOUS

## 2021-05-02 NOTE — Progress Notes (Signed)
FYI

## 2021-05-02 NOTE — Progress Notes (Signed)
MRI results shared with Dr [name].   Oncology Nurse Navigator Documentation  Oncology Nurse Navigator Flowsheets 05/02/2021  Abnormal Finding Date -  Confirmed Diagnosis Date -  Diagnosis Status -  Navigator Follow Up Date: 05/05/2021  Navigator Follow Up Reason: Other:  Production assistant, radio Encounter Type Scan Review  Telephone -  Patient Visit Type MedOnc  Treatment Phase Pre-Tx/Tx Discussion  Barriers/Navigation Needs Coordination of Care;Education  Education -  Interventions Coordination of Care  Acuity Level 2-Minimal Needs (1-2 Barriers Identified)  Referrals -  Coordination of Care Other  Education Method -  Support Groups/Services Friends and Family  Time Spent with Patient 15

## 2021-05-02 NOTE — Progress Notes (Signed)
There is concern for the large lesion on liver being cancer. Looks like metastasis from colon. Will send to endocronologist.  Spleen cyst appears benign.   Continues to have cholelithiasis.

## 2021-05-03 ENCOUNTER — Encounter: Payer: Self-pay | Admitting: *Deleted

## 2021-05-03 ENCOUNTER — Other Ambulatory Visit (HOSPITAL_BASED_OUTPATIENT_CLINIC_OR_DEPARTMENT_OTHER): Payer: 59

## 2021-05-03 DIAGNOSIS — C189 Malignant neoplasm of colon, unspecified: Secondary | ICD-10-CM | POA: Insufficient documentation

## 2021-05-03 DIAGNOSIS — N9489 Other specified conditions associated with female genital organs and menstrual cycle: Secondary | ICD-10-CM | POA: Insufficient documentation

## 2021-05-03 DIAGNOSIS — R9389 Abnormal findings on diagnostic imaging of other specified body structures: Secondary | ICD-10-CM | POA: Insufficient documentation

## 2021-05-03 NOTE — Progress Notes (Signed)
Reviewed PET scan with Dr [name]. Metastatic disease confirmed. Dr [name] would like to see patient this week. She is already scheduled for iron infusion this Friday; will add-on MD appointment and labs.  Called and spoke to patient. Generally reviewed results stating that upfront surgery was no longer the treatment plan and that Dr [name] would visit with her on Friday. No change to appointment time.   She mentions she is coming back from her OB who is unable to perform a biopsy due to scar tissue, but recommends a D&C. Advised patient to talk to Dr [name] about importance of this step, as patient's PET shows no uterine activity.   Oncology Nurse Navigator Documentation  Oncology Nurse Navigator Flowsheets 05/03/2021  Abnormal Finding Date -  Confirmed Diagnosis Date -  Diagnosis Status -  Navigator Follow Up Date: 05/05/2021  Navigator Follow Up Reason: Follow-up Appointment  Navigator Location [facility]  Navigator Encounter Type Scan Review;Telephone  Telephone Appt Confirmation/Clarification;Outgoing Call  Patient Visit Type MedOnc  Treatment Phase Pre-Tx/Tx Discussion  Barriers/Navigation Needs Coordination of Care;Education  Education Other  Interventions Coordination of Care;Education;Psycho-Social Support  Acuity Level 2-Minimal Needs (1-2 Barriers Identified)  Referrals -  Coordination of Care Appts  Education Method Verbal  Support Groups/Services Friends and Family  Time Spent with Patient 30

## 2021-05-04 ENCOUNTER — Encounter: Payer: Self-pay | Admitting: *Deleted

## 2021-05-04 NOTE — Progress Notes (Signed)
Per Dr [name], [facility] One testing request sent on specimen [id number] DOS 04/18/2021.   Oncology Nurse Navigator Documentation  Oncology Nurse Navigator Flowsheets 05/04/2021  Abnormal Finding Date -  Confirmed Diagnosis Date -  Diagnosis Status -  Navigator Follow Up Date: 05/05/2021  Navigator Follow Up Reason: Follow-up Appointment  Navigator Location [facility]  Navigator Encounter Type Molecular Studies  Telephone -  Patient Visit Type MedOnc  Treatment Phase Pre-Tx/Tx Discussion  Barriers/Navigation Needs Coordination of Care;Education  Education -  Interventions Coordination of Care  Acuity Level 2-Minimal Needs (1-2 Barriers Identified)  Referrals -  Coordination of Care Pathology  Education Method -  Support Groups/Services Friends and Family  Time Spent with Patient 8

## 2021-05-05 ENCOUNTER — Encounter: Payer: Self-pay | Admitting: *Deleted

## 2021-05-05 ENCOUNTER — Inpatient Hospital Stay (HOSPITAL_BASED_OUTPATIENT_CLINIC_OR_DEPARTMENT_OTHER): Payer: 59 | Admitting: Hematology & Oncology

## 2021-05-05 ENCOUNTER — Encounter: Payer: Self-pay | Admitting: Hematology & Oncology

## 2021-05-05 ENCOUNTER — Inpatient Hospital Stay: Payer: 59

## 2021-05-05 ENCOUNTER — Inpatient Hospital Stay: Payer: 59 | Attending: Hematology & Oncology

## 2021-05-05 ENCOUNTER — Other Ambulatory Visit: Payer: Self-pay

## 2021-05-05 VITALS — BP 115/83 | HR 98 | Temp 98.1°F | Resp 18 | Wt 190.0 lb

## 2021-05-05 VITALS — BP 128/76

## 2021-05-05 DIAGNOSIS — C787 Secondary malignant neoplasm of liver and intrahepatic bile duct: Secondary | ICD-10-CM | POA: Insufficient documentation

## 2021-05-05 DIAGNOSIS — D509 Iron deficiency anemia, unspecified: Secondary | ICD-10-CM | POA: Diagnosis present

## 2021-05-05 DIAGNOSIS — D649 Anemia, unspecified: Secondary | ICD-10-CM

## 2021-05-05 DIAGNOSIS — Z5111 Encounter for antineoplastic chemotherapy: Secondary | ICD-10-CM | POA: Diagnosis present

## 2021-05-05 DIAGNOSIS — C189 Malignant neoplasm of colon, unspecified: Secondary | ICD-10-CM

## 2021-05-05 DIAGNOSIS — K769 Liver disease, unspecified: Secondary | ICD-10-CM | POA: Diagnosis not present

## 2021-05-05 DIAGNOSIS — C779 Secondary and unspecified malignant neoplasm of lymph node, unspecified: Secondary | ICD-10-CM | POA: Insufficient documentation

## 2021-05-05 DIAGNOSIS — Z79899 Other long term (current) drug therapy: Secondary | ICD-10-CM | POA: Insufficient documentation

## 2021-05-05 DIAGNOSIS — C182 Malignant neoplasm of ascending colon: Secondary | ICD-10-CM | POA: Insufficient documentation

## 2021-05-05 DIAGNOSIS — Z7189 Other specified counseling: Secondary | ICD-10-CM | POA: Diagnosis not present

## 2021-05-05 HISTORY — DX: Other specified counseling: Z71.89

## 2021-05-05 HISTORY — DX: Secondary malignant neoplasm of liver and intrahepatic bile duct: C18.9

## 2021-05-05 LAB — URINALYSIS, COMPLETE (UACMP) WITH MICROSCOPIC
Glucose, UA: NEGATIVE mg/dL
Hgb urine dipstick: NEGATIVE
Ketones, ur: 40 mg/dL — AB
Nitrite: NEGATIVE
Protein, ur: NEGATIVE mg/dL
Specific Gravity, Urine: >=1.03 (ref 1.005–1.030)
pH: 5.5 (ref 5.0–8.0)

## 2021-05-05 MED ORDER — SODIUM CHLORIDE 0.9 % IV SOLN: Freq: Once | INTRAVENOUS | Status: AC

## 2021-05-05 MED ORDER — SODIUM CHLORIDE 0.9 % IV SOLN
125.0000 mg | Freq: Once | INTRAVENOUS | Status: AC
  Administered 2021-05-05: 125 mg via INTRAVENOUS
  Filled 2021-05-05: qty 125

## 2021-05-05 NOTE — Patient Instructions (Signed)
Sodium Ferric Gluconate Complex Injection ?What is this medication? ?SODIUM FERRIC GLUCONATE COMPLEX (SOE dee um FER ik GLOO koe nate KOM pleks) treats low levels of iron (iron deficiency anemia) in people with kidney disease. Iron is a mineral that plays an important role in making red blood cells, which carry oxygen from your lungs to the rest of your body. ?This medicine may be used for other purposes; ask your health care provider or pharmacist if you have questions. ?COMMON BRAND NAME(S): Ferrlecit, Nulecit ?What should I tell my care team before I take this medication? ?They need to know if you have any of the following conditions: ?Anemia that is not from iron deficiency ?High levels of iron in the blood ?An unusual or allergic reaction to iron, other medications, foods, dyes, or preservatives ?Pregnant or are trying to become pregnant ?Breast-feeding ?How should I use this medication? ?This medication is injected into a vein. It is given by your care team in a hospital or clinic setting. ?Talk to your care team about the use of this medication in children. While it may be prescribed for children as young as 6 years for selected conditions, precautions do apply. ?Overdosage: If you think you have taken too much of this medicine contact a poison control center or emergency room at once. ?NOTE: This medicine is only for you. Do not share this medicine with others. ?What if I miss a dose? ?It is important not to miss your dose. Call your care team if you are unable to keep an appointment. ?What may interact with this medication? ?Do not take this medication with any of the following: ?Deferasirox ?Deferoxamine ?Dimercaprol ?This medication may also interact with the following: ?Other iron products ?This list may not describe all possible interactions. Give your health care provider a list of all the medicines, herbs, non-prescription drugs, or dietary supplements you use. Also tell them if you smoke, drink  alcohol, or use illegal drugs. Some items may interact with your medicine. ?What should I watch for while using this medication? ?Your condition will be monitored carefully while you are receiving this medication. ?Visit your care team for regular checks on your progress. You may need blood work while you are taking this medication. ?What side effects may I notice from receiving this medication? ?Side effects that you should report to your care team as soon as possible: ?Allergic reactions--skin rash, itching, hives, swelling of the face, lips, tongue, or throat ?Low blood pressure--dizziness, feeling faint or lightheaded, blurry vision ?Shortness of breath ?Side effects that usually do not require medical attention (report to your care team if they continue or are bothersome): ?Flushing ?Headache ?Joint pain ?Muscle pain ?Nausea ?Pain, redness, or irritation at injection site ?This list may not describe all possible side effects. Call your doctor for medical advice about side effects. You may report side effects to FDA at 1-800-FDA-1088. ?Where should I keep my medication? ?This medication is given in a hospital or clinic and will not be stored at home. ?NOTE: This sheet is a summary. It may not cover all possible information. If you have questions about this medicine, talk to your doctor, pharmacist, or health care provider. ?? 2022 Elsevier/Gold Standard (2020-10-07 00:00:00) ? ?

## 2021-05-05 NOTE — Progress Notes (Signed)
Hematology and Oncology Follow Up Visit  Rebecca Bullock [id number] 14-Dec-1965 55 y.o. 05/05/2021   Principle Diagnosis:  Stage IV adenocarcinoma of the ascending colon --liver and lymph node metastasis  Current Therapy:   FOLFIRINOX 00 start cycle #1 on 05/16/2021     Interim History:  Rebecca Bullock is back for follow-up.  We unfortunately have stage IV colon cancer.  She had a PET scan done.  The PET scan showed that the tumor in the liver was clearly hypermetabolic.  She has hypermetabolic lymph nodes.  She really does not have a lot of disease.  We are still awaiting the molecular studies on her tumor.   Surprisingly, her CEA is only 7.  Use that is a marker for response.  She is getting some iron today.  I think that we can be aggressive.  I really think that we might be able to get her into a remission.  If we get some good tumor shrinkage, that we might build to do some intrahepatic therapy for her malignancy.  She is having no obvious bleeding.  He has been no problems with pain.  She has had no cough or shortness of breath.  Overall, I would have to say her performance status is ECOG 1.    Medications:  Current Outpatient Medications:    Pseudoephedrine HCl (SUDAFED CONGESTION PO), Take by mouth as needed., Disp: , Rfl:    albuterol (VENTOLIN HFA) 108 (90 Base) MCG/ACT inhaler, Inhale 2 puffs into the lungs every 6 (six) hours as needed. (Patient taking differently: Inhale 2 puffs into the lungs every 6 (six) hours as needed for shortness of breath or wheezing.), Disp: 18 g, Rfl: 0   betamethasone valerate (VALISONE) 0.1 % cream, Apply 1 application topically See admin instructions. Apply 1 application topically every other day as needed for dermatitis, Disp: , Rfl:    estradiol (ESTRACE) 0.1 MG/GM vaginal cream, Apply a pea-sized amount externally and 0.5 grams twice a week in the morning, Disp: , Rfl:    fluticasone (FLONASE) 50 MCG/ACT nasal spray, Place 2 sprays into both  nostrils daily., Disp: 16 g, Rfl: 6   folic acid (FOLVITE) 1 MG tablet, Take 1 tablet (1 mg total) by mouth daily., Disp: 100 tablet, Rfl:    ibuprofen (ADVIL) 800 MG tablet, Take 800 mg by mouth every 8 (eight) hours as needed for mild pain., Disp: , Rfl:    iron polysaccharides (NIFEREX) 150 MG capsule, Take 1 capsule (150 mg total) by mouth daily., Disp: 100 capsule, Rfl: 2   montelukast (SINGULAIR) 10 MG tablet, Take 1 tablet (10 mg total) by mouth at bedtime., Disp: 90 tablet, Rfl: 3   Multiple Vitamins-Minerals (MULTIVITAMIN WITH MINERALS) tablet, Take 1 tablet by mouth daily., Disp: 100 tablet, Rfl: 0   pantoprazole (PROTONIX) 40 MG tablet, Take 1 tablet (40 mg total) by mouth daily., Disp: 30 tablet, Rfl: 3   vitamin B-12 (CYANOCOBALAMIN) 1000 MCG tablet, Take 1 tablet (1,000 mcg total) by mouth daily., Disp: 100 tablet, Rfl: 0  Allergies:  Allergies  Allergen Reactions   Metronidazole     Other reaction(s): GI Upset (intolerance)    Past Medical History, Surgical history, Social history, and Family History were reviewed and updated.  Review of Systems: Review of Systems  Constitutional: Negative.   HENT:  Negative.    Eyes: Negative.   Respiratory: Negative.    Cardiovascular: Negative.   Gastrointestinal: Negative.   Endocrine: Negative.   Genitourinary: Negative.    Musculoskeletal:  Negative.   Skin: Negative.   Neurological: Negative.   Hematological: Negative.   Psychiatric/Behavioral: Negative.     Physical Exam:  weight is 190 lb (86.2 kg). Her oral temperature is 98.1 F (36.7 C). Her blood pressure is 115/83 and her pulse is 98. Her respiration is 18 and oxygen saturation is 100%.   Wt Readings from Last 3 Encounters:  05/05/21 190 lb (86.2 kg)  04/24/21 193 lb (87.5 kg)  04/10/21 194 lb (88 kg)    Physical Exam Vitals reviewed.  HENT:     Head: Normocephalic and atraumatic.  Eyes:     Pupils: Pupils are equal, round, and reactive to light.   Cardiovascular:     Rate and Rhythm: Normal rate and regular rhythm.     Heart sounds: Normal heart sounds.  Pulmonary:     Effort: Pulmonary effort is normal.     Breath sounds: Normal breath sounds.  Abdominal:     General: Bowel sounds are normal.     Palpations: Abdomen is soft.  Musculoskeletal:        General: No tenderness or deformity. Normal range of motion.     Cervical back: Normal range of motion.  Lymphadenopathy:     Cervical: No cervical adenopathy.  Skin:    General: Skin is warm and dry.     Findings: No erythema or rash.  Neurological:     Mental Status: She is alert and oriented to person, place, and time.  Psychiatric:        Behavior: Behavior normal.        Thought Content: Thought content normal.        Judgment: Judgment normal.     Lab Results  Component Value Date   WBC 6.7 04/24/2021   HGB 9.7 (L) 04/24/2021   HCT 33.4 (L) 04/24/2021   MCV 72.8 (L) 04/24/2021   PLT 280 04/24/2021     Chemistry      Component Value Date/Time   NA 141 04/24/2021 1207   K 4.3 04/24/2021 1207   CL 104 04/24/2021 1207   CO2 30 04/24/2021 1207   BUN 5 (L) 04/24/2021 1207   CREATININE 0.61 04/24/2021 1207   CREATININE 0.56 03/23/2021 0000      Component Value Date/Time   CALCIUM 9.4 04/24/2021 1207   ALKPHOS 118 04/24/2021 1207   AST 21 04/24/2021 1207   ALT 12 04/24/2021 1207   BILITOT 0.2 (L) 04/24/2021 1207      Impression and Plan: Rebecca Bullock is a very charming 55 year old white female with metastatic colon cancer.  This is in the descending colon.  We do not yet have back the molecular markers.  I will go ahead and start her on FOLFIRINOX chemotherapy.  She will need a Port-A-Cath placed.  Gave her information sheets about the chemotherapy.  I will hold off on using Avastin right now.  We will try neurologist chemotherapy.  If we do not get a good response with 4 cycles, then we can make the addition.  Her boyfriend was with her.  I am happy that  he was with her.  I answered all of their questions.  We are going to go ahead and try to get started a week of December 19.  Again, we will give 4 cycles of treatment and then repeat the PET scan and then see how everything looks  We may consider intrahepatic therapy.  Again, I would like to think that we can be aggressive given her  young age and great performance status.  I will see her back when she has her second cycle of treatment.     [name], MD 12/9/20223:40 PM

## 2021-05-05 NOTE — Progress Notes (Signed)
START ON PATHWAY REGIMEN - Colorectal     A cycle is every 14 days:     Bevacizumab-xxxx      Irinotecan      Oxaliplatin      Leucovorin      Fluorouracil   **Always confirm dose/schedule in your pharmacy ordering system**  Patient Characteristics: Distant Metastases, Nonsurgical Candidate, KRAS/NRAS Mutation Positive/Unknown (BRAF V600 Wild-Type/Unknown), Standard Cytotoxic Therapy, First Line Standard Cytotoxic Therapy, Bevacizumab Eligible, PS = 0,1 Tumor Location: Colon Therapeutic Status: Distant Metastases Microsatellite/Mismatch Repair Status: Unknown BRAF Mutation Status: Awaiting Test Results KRAS/NRAS Mutation Status: Awaiting Test Results Standard Cytotoxic Line of Therapy: First Line Standard Cytotoxic Therapy ECOG Performance Status: 0 Bevacizumab Eligibility: Eligible Intent of Therapy: Curative Intent, Discussed with Patient

## 2021-05-05 NOTE — Progress Notes (Signed)
U/A specimen delivered to the lab

## 2021-05-05 NOTE — Progress Notes (Signed)
Patient here for treatment discussion after completing workup. Plan to begin chemotherapy. She will need port placed and chemo education.   Patient had decided on genetics earlier and the labs were drawn today. Genetics notified and referral placed. Sample will be mailed today. Patient given genetics brochure and registration card.   Chemo education scheduled for 05/09/2021  Port scheduled for 05/11/2021 arrival at 8am. She is aware of NPO and need for driver.   Oncology Nurse Navigator Documentation  Oncology Nurse Navigator Flowsheets 05/05/2021  Abnormal Finding Date -  Confirmed Diagnosis Date -  Diagnosis Status -  Navigator Follow Up Date: -  Navigator Follow Up Reason: Paediatric nurse Encounter Type Follow-up Appt  Telephone -  Patient Visit Type MedOnc  Treatment Phase Pre-Tx/Tx Discussion  Barriers/Navigation Needs Coordination of Care;Education  Education Other  Interventions Coordination of Care;Education;Psycho-Social Support;Referrals  Acuity Level 2-Minimal Needs (1-2 Barriers Identified)  Referrals Genetics  Coordination of Care Appts;Radiology  Education Method Verbal;Written  Support Groups/Services Friends and Family  Time Spent with Patient 47

## 2021-05-06 LAB — URINE CULTURE: Culture: <10000 — AB

## 2021-05-08 ENCOUNTER — Other Ambulatory Visit: Payer: 59

## 2021-05-08 ENCOUNTER — Other Ambulatory Visit: Payer: Self-pay | Admitting: Pharmacist

## 2021-05-08 ENCOUNTER — Other Ambulatory Visit (HOSPITAL_BASED_OUTPATIENT_CLINIC_OR_DEPARTMENT_OTHER): Payer: Self-pay

## 2021-05-08 ENCOUNTER — Telehealth: Payer: Self-pay | Admitting: Licensed Clinical Social Worker

## 2021-05-08 ENCOUNTER — Other Ambulatory Visit: Payer: Self-pay | Admitting: *Deleted

## 2021-05-08 ENCOUNTER — Encounter: Payer: Self-pay | Admitting: Hematology & Oncology

## 2021-05-08 ENCOUNTER — Encounter: Payer: Self-pay | Admitting: Family

## 2021-05-08 DIAGNOSIS — C189 Malignant neoplasm of colon, unspecified: Secondary | ICD-10-CM

## 2021-05-08 DIAGNOSIS — C787 Secondary malignant neoplasm of liver and intrahepatic bile duct: Secondary | ICD-10-CM

## 2021-05-08 MED ORDER — ONDANSETRON HCL 8 MG PO TABS
8.0000 mg | ORAL_TABLET | Freq: Two times a day (BID) | ORAL | 1 refills | Status: DC | PRN
  Filled 2021-05-08: qty 30, 15d supply, fill #0

## 2021-05-08 MED ORDER — LIDOCAINE-PRILOCAINE 2.5-2.5 % EX CREA
TOPICAL_CREAM | CUTANEOUS | 3 refills | Status: DC
  Filled 2021-05-08: qty 30, 25d supply, fill #0

## 2021-05-08 MED ORDER — PROCHLORPERAZINE MALEATE 10 MG PO TABS
10.0000 mg | ORAL_TABLET | Freq: Four times a day (QID) | ORAL | 1 refills | Status: DC | PRN
  Filled 2021-05-08: qty 30, 8d supply, fill #0

## 2021-05-08 MED ORDER — LOPERAMIDE HCL 2 MG PO CAPS
ORAL_CAPSULE | ORAL | 2 refills | Status: DC
  Filled 2021-05-08: qty 60, 30d supply, fill #0

## 2021-05-08 MED ORDER — DEXAMETHASONE 4 MG PO TABS
8.0000 mg | ORAL_TABLET | Freq: Every day | ORAL | 5 refills | Status: DC
  Filled 2021-05-08: qty 8, 4d supply, fill #0

## 2021-05-08 NOTE — Telephone Encounter (Signed)
Scheduled appt per 12/9 referral. Pt is aware of appt date and time.

## 2021-05-09 ENCOUNTER — Inpatient Hospital Stay: Payer: 59

## 2021-05-09 ENCOUNTER — Encounter: Payer: Self-pay | Admitting: *Deleted

## 2021-05-09 ENCOUNTER — Other Ambulatory Visit: Payer: Self-pay

## 2021-05-09 NOTE — Progress Notes (Signed)
Patient in chemotherapy education class with  significant other.  Discussed side effects of 5FU, Oxaliplatin, Camptosar, Leucovorin  which include but are not limited to myelosuppression, decreased appetite, fatigue, fever, allergic or infusional reaction, mucositis, cardiac toxicity, cough, SOB, altered taste, nausea and vomiting, diarrhea, constipation, elevated LFTs myalgia and arthralgias, hair loss or thinning, rash, skin dryness, nail changes, peripheral neuropathy, discolored urine, delayed wound healing, mental changes (Chemo brain), increased risk of infections, weight loss.  Reviewed infusion room and office policy and procedure and phone numbers 24 hours x 7 days a week.  Reviewed ambulatory pump specifics and how to manage safe handling at home.  Reviewed when to call the office with any concerns or problems.  Scientist, clinical (histocompatibility and immunogenetics) given.  Discussed portacath insertion and EMLA cream administration.  Antiemetic protocol and chemotherapy schedule reviewed. Patient verbalized understanding of chemotherapy indications and possible side effects.  Teachback done

## 2021-05-09 NOTE — Progress Notes (Signed)
Oncology Nurse Navigator Documentation  Oncology Nurse Navigator Flowsheets 05/09/2021  Abnormal Finding Date -  Confirmed Diagnosis Date -  Diagnosis Status -  Phase of Treatment Chemo  Chemotherapy Pending- Reason: Authorization  Navigator Follow Up Date: 05/16/2021  Navigator Follow Up Reason: Chemotherapy  Navigator Location [facility]  Navigator Encounter Type Appt/Treatment Plan Review  Telephone -  Patient Visit Type MedOnc  Treatment Phase Pre-Tx/Tx Discussion  Barriers/Navigation Needs Coordination of Care;Education  Education -  Interventions None Required  Acuity Level 2-Minimal Needs (1-2 Barriers Identified)  Referrals -  Coordination of Care -  Education Method -  Support Groups/Services Friends and Family  Time Spent with Patient 15

## 2021-05-09 NOTE — Progress Notes (Signed)
Pharmacist Chemotherapy Monitoring - Initial Assessment    Anticipated start date: 05/16/21   The following has been reviewed per standard work regarding the patient's treatment regimen: The patient's diagnosis, treatment plan and drug doses, and organ/hematologic function Lab orders and baseline tests specific to treatment regimen  The treatment plan start date, drug sequencing, and pre-medications Prior authorization status  Patient's documented medication list, including drug-drug interaction screen and prescriptions for anti-emetics and supportive care specific to the treatment regimen The drug concentrations, fluid compatibility, administration routes, and timing of the medications to be used The patient's access for treatment and lifetime cumulative dose history, if applicable  The patient's medication allergies and previous infusion related reactions, if applicable   Changes made to treatment plan:  N/A  Follow up needed:  Pending authorization for treatment    Rebecca Bullock, Jacqlyn Larsen, [facility], 05/09/2021  2:13 PM

## 2021-05-10 ENCOUNTER — Other Ambulatory Visit: Payer: Self-pay | Admitting: Radiology

## 2021-05-11 ENCOUNTER — Other Ambulatory Visit: Payer: Self-pay | Admitting: Hematology & Oncology

## 2021-05-11 ENCOUNTER — Encounter: Payer: Self-pay | Admitting: *Deleted

## 2021-05-11 ENCOUNTER — Other Ambulatory Visit: Payer: Self-pay

## 2021-05-11 ENCOUNTER — Ambulatory Visit (HOSPITAL_COMMUNITY)
Admission: RE | Admit: 2021-05-11 | Discharge: 2021-05-11 | Disposition: A | Discharge status: Home / Self Care | Payer: 59 | Source: Ambulatory Visit | Attending: Hematology & Oncology | Admitting: Hematology & Oncology

## 2021-05-11 DIAGNOSIS — C787 Secondary malignant neoplasm of liver and intrahepatic bile duct: Secondary | ICD-10-CM

## 2021-05-11 DIAGNOSIS — K769 Liver disease, unspecified: Secondary | ICD-10-CM

## 2021-05-11 DIAGNOSIS — C189 Malignant neoplasm of colon, unspecified: Secondary | ICD-10-CM | POA: Insufficient documentation

## 2021-05-11 DIAGNOSIS — C779 Secondary and unspecified malignant neoplasm of lymph node, unspecified: Secondary | ICD-10-CM | POA: Insufficient documentation

## 2021-05-11 HISTORY — PX: IR IMAGING GUIDED PORT INSERTION: IMG5740

## 2021-05-11 IMAGING — XA IR IMAGING GUIDED PORT INSERTION
2 series · 4 of 4 positions shown · non-contrast
Comparison: none

INDICATION: 55-year-old female with a history of liver lesions

[Series 1: fl (-) angio · 1 of 1 slices shown]
[im 1/1]
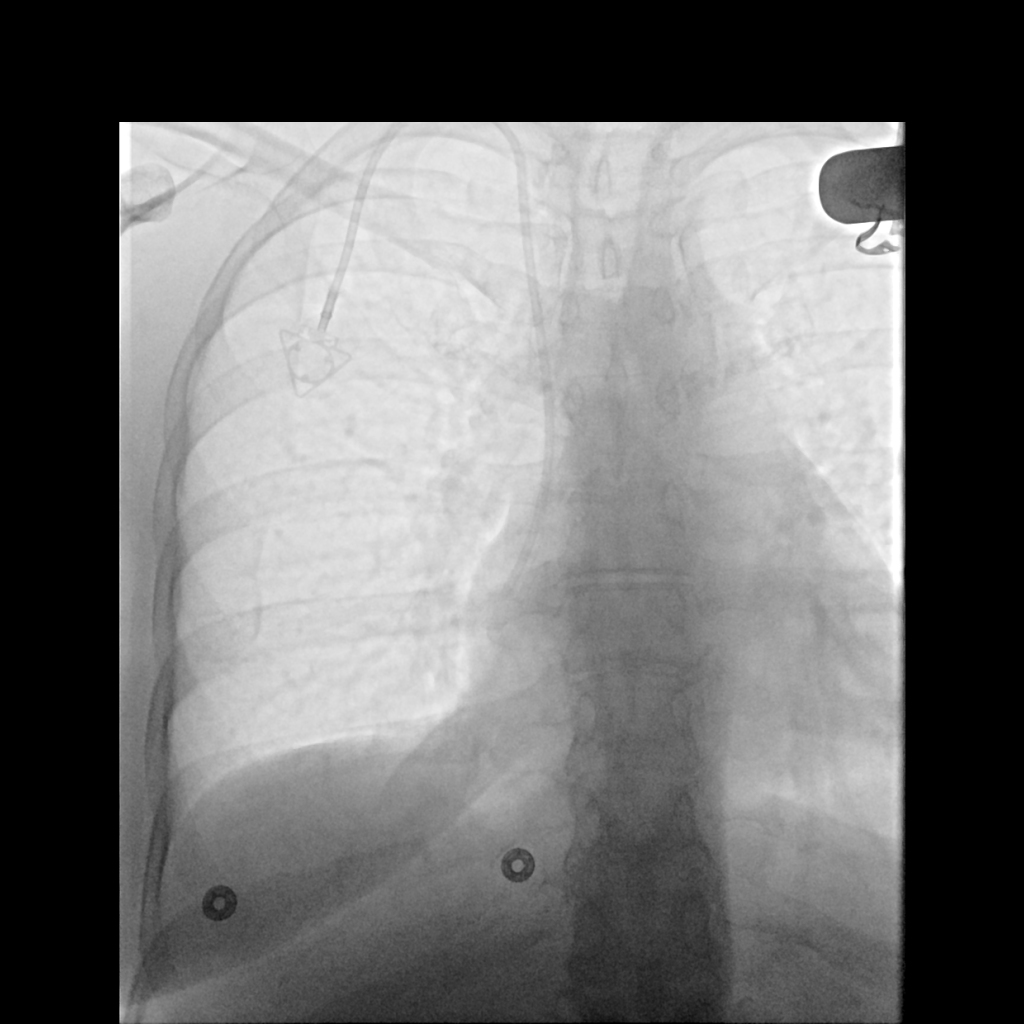

[Series 1: ir imaging guided port insertion · 3 of 3 slices shown]
[im 1/3]
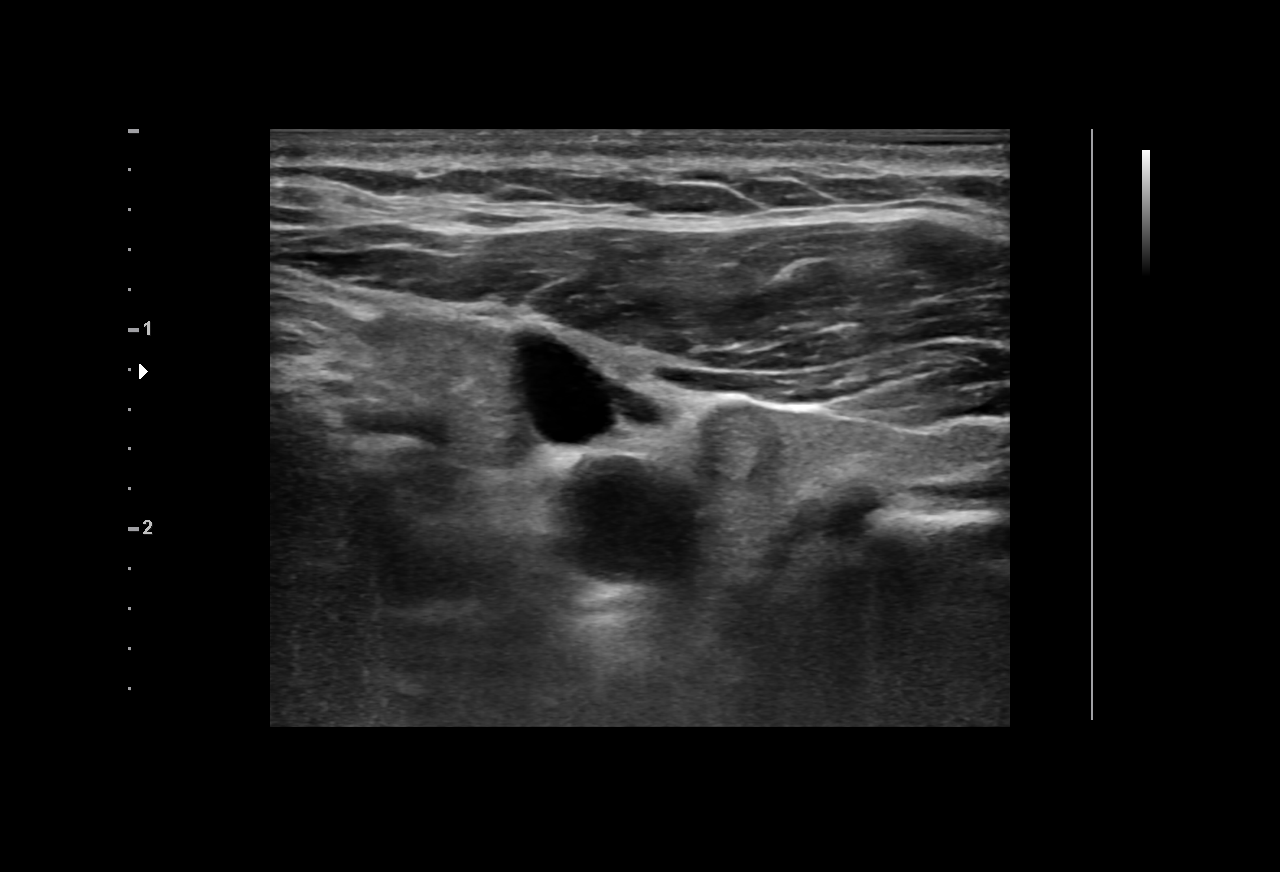
[im 2/3]
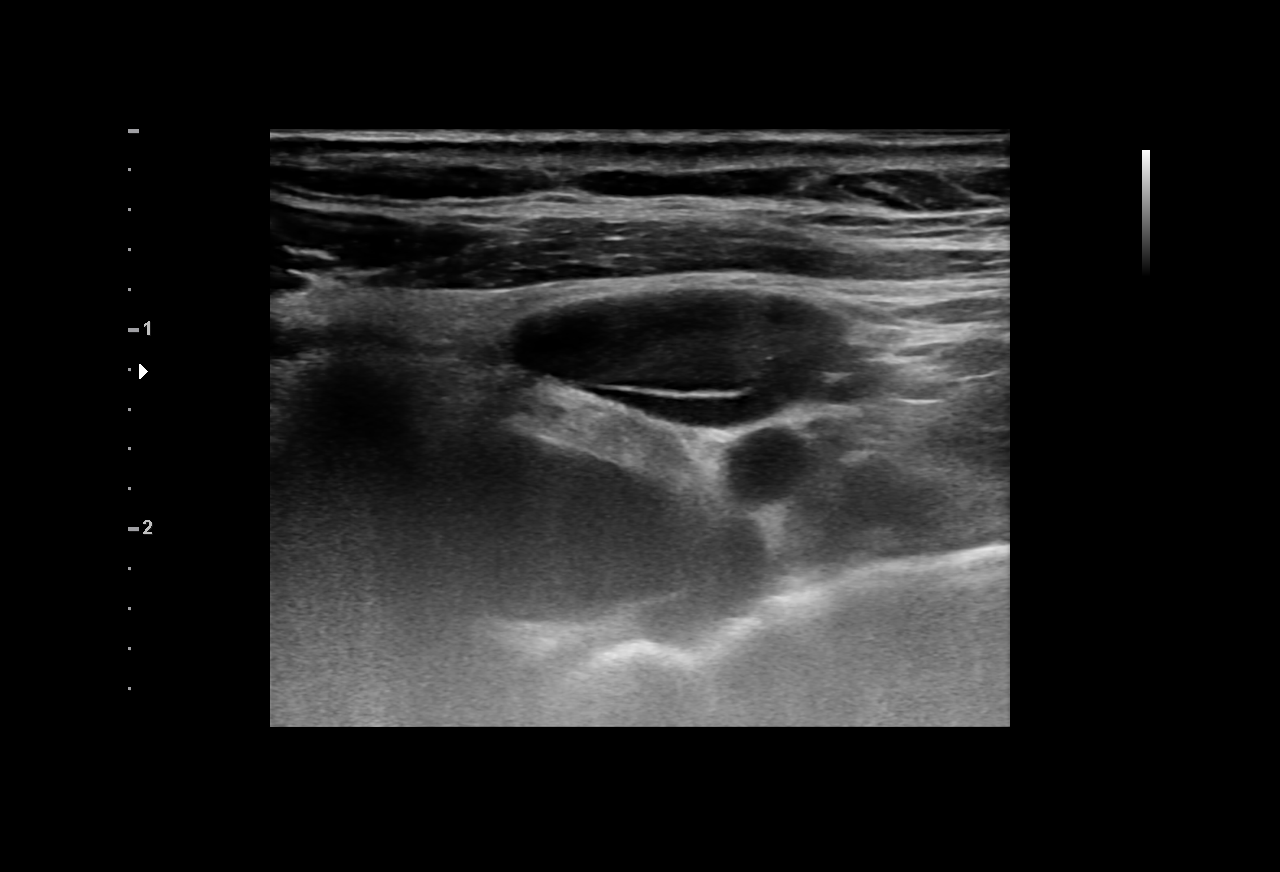
[im 3/3]
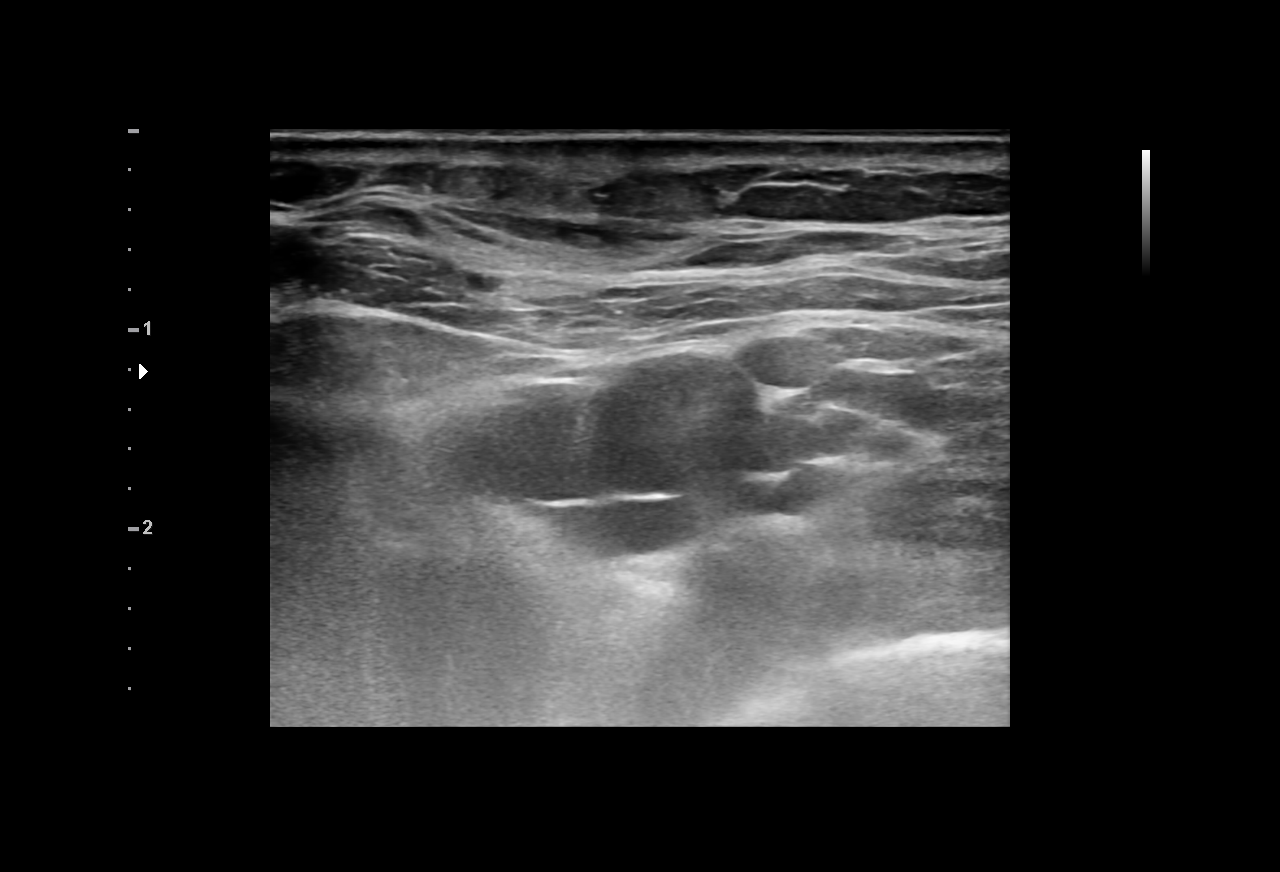

[4 of 4 positions shown; findings below may reference images not displayed]

EXAM:
IMAGE GUIDED PORT CATHETER

MEDICATIONS:
None

ANESTHESIA/SEDATION:
Moderate (conscious) sedation was employed during this procedure. A
total of Versed 1.5 mg and Fentanyl 50 mcg was administered
intravenously.

Moderate Sedation Time: 18 minutes. The patient's level of
consciousness and vital signs were monitored continuously by
radiology nursing throughout the procedure under my direct
supervision.

FLUOROSCOPY TIME:  Fluoroscopy Time: 0 minutes 6 seconds (12.5 mGy).

COMPLICATIONS:
None

PROCEDURE:
Informed written consent was obtained from the patient after a
thorough discussion of the procedural risks, benefits and
alternatives. All questions were addressed. Maximal Sterile Barrier
Technique was utilized including caps, mask, sterile gowns, sterile
gloves, sterile drape, hand hygiene and skin antiseptic. A timeout
was performed prior to the initiation of the procedure.

Ultrasound survey was performed with images stored and sent to PACs.
Right IJ vein documented to be patent.

The right neck and chest was prepped with chlorhexidine, and draped
in the usual sterile fashion using maximum barrier technique (cap
and mask, sterile gown, sterile gloves, large sterile sheet, hand
hygiene and cutaneous antiseptic). Local anesthesia was attained by
infiltration with 1% lidocaine without epinephrine.

Ultrasound demonstrated patency of the right internal jugular vein,
and this was documented with an image. Under real-time ultrasound
guidance, this vein was accessed with a 21 gauge micropuncture
needle and image documentation was performed. A small dermatotomy
was made at the access site with an 11 scalpel. A 0.018" wire was
advanced into the SVC and used to estimate the length of the
internal catheter. The access needle exchanged for a 4F
micropuncture vascular sheath. The 0.018" wire was then removed and
a 0.035" wire advanced into the IVC.



The venous access site was then serially dilated and a peel away
vascular sheath placed over the wire. The wire was removed and the
port catheter advanced into position under fluoroscopic guidance.
The catheter tip is positioned in the cavoatrial junction. This was
documented with a spot image. The portacatheter was then tested and
found to flush and aspirate well. The port was flushed with saline
followed by 100 units/mL heparinized saline.

The pocket was then closed in two layers using first subdermal
inverted interrupted absorbable sutures followed by a running
subcuticular suture. The epidermis was then sealed with Dermabond.
The dermatotomy at the venous access site was also seal with
Dermabond.

Patient tolerated the procedure well and remained hemodynamically
stable throughout.

No complications encountered and no significant blood loss
encountered
IMPRESSION: Status post right IJ port catheter placement.

## 2021-05-11 MED ORDER — MIDAZOLAM HCL 2 MG/2ML IJ SOLN
INTRAMUSCULAR | Status: DC | PRN
  Administered 2021-05-11: 1 mg via INTRAVENOUS
  Administered 2021-05-11: .5 mg via INTRAVENOUS

## 2021-05-11 MED ORDER — SODIUM CHLORIDE 0.9 % IV SOLN: INTRAVENOUS | Status: DC

## 2021-05-11 MED ORDER — HEPARIN SOD (PORK) LOCK FLUSH 100 UNIT/ML IV SOLN
INTRAVENOUS | Status: AC
  Administered 2021-05-11: 5 [IU]
  Filled 2021-05-11: qty 5

## 2021-05-11 MED ORDER — LIDOCAINE HCL 1 % IJ SOLN
INTRAMUSCULAR | Status: AC
  Administered 2021-05-11: 15 mL via SUBCUTANEOUS
  Filled 2021-05-11: qty 20

## 2021-05-11 MED ORDER — LIDOCAINE-EPINEPHRINE 1 %-1:100000 IJ SOLN
INTRAMUSCULAR | Status: AC
  Filled 2021-05-11: qty 1

## 2021-05-11 MED ORDER — FENTANYL CITRATE (PF) 100 MCG/2ML IJ SOLN
INTRAMUSCULAR | Status: DC | PRN
  Administered 2021-05-11: 50 ug via INTRAVENOUS

## 2021-05-11 MED ORDER — MIDAZOLAM HCL 2 MG/2ML IJ SOLN
INTRAMUSCULAR | Status: AC
  Filled 2021-05-11: qty 2

## 2021-05-11 MED ORDER — FENTANYL CITRATE (PF) 100 MCG/2ML IJ SOLN
INTRAMUSCULAR | Status: AC
  Filled 2021-05-11: qty 2

## 2021-05-11 NOTE — Procedures (Signed)
Interventional Radiology Procedure Note  Procedure: Placement of a right IJ approach single lumen PowerPort.  Tip is positioned at the superior cavoatrial junction and catheter is ready for immediate use.  Complications: None Recommendations:  - Ok to shower tomorrow - Do not submerge for 7 days - Routine line care   Signed,  Sawsan Riggio S. Artemio Dobie, DO   

## 2021-05-11 NOTE — H&P (Signed)
Chief Complaint: Patient was seen in consultation today for port-a-catheter placement   Referring Physician(s): Ennever,Peter R  Supervising Physician: Corrie Mckusick  Patient Status: [facility] - Out-pt  History of Present Illness: Rebecca Bullock is a 55 y.o. female with a recent diagnosis of metastatic colon cancer. She was sent to the ED 03/14/21 by her PCP when lab work showed a hemoglobin of 5.3. Fecal occult blood was negative but iron and B12 levels were low. She was admitted for treatment and was discharged with outpatient follow up. An 04/18/21 colonoscopy identified a mass in the ascending colon with pathology positive for adenocarcinoma. Additional imaging showed liver and lymph node metastases. Her oncology team is preparing her for chemotherapy.  Interventional Radiology has been asked to evaluate this patient for an image-guided port-a-catheter placement to facilitate her treatment goals.   Past Medical History:  Diagnosis Date   Colon cancer metastasized to liver ([place]) 05/05/2021   Goals of care, counseling/discussion 05/05/2021    Past Surgical History:  Procedure Laterality Date   uterine ablation      Allergies: Metronidazole  Medications: Prior to Admission medications   Medication Sig Start Date End Date Taking? Authorizing Provider  acetaminophen (TYLENOL) 325 MG tablet Take 325 mg by mouth every 6 (six) hours as needed for moderate pain or headache.   Yes [provider]  albuterol (VENTOLIN HFA) 108 (90 Base) MCG/ACT inhaler Inhale 2 puffs into the lungs every 6 (six) hours as needed. Patient taking differently: Inhale 2 puffs into the lungs every 6 (six) hours as needed for shortness of breath or wheezing. 03/13/21  Yes Breeback, Jade L, PA-C  betamethasone valerate (VALISONE) 0.1 % cream Apply 1 application topically See admin instructions. Apply 1 application topically every other day as needed for psoriasis flare 11/11/20  Yes [provider]   estradiol (ESTRACE) 0.1 MG/GM vaginal cream Place 1 Applicatorful vaginally daily as needed (dryness / irritation). 11/11/20  Yes [provider]  folic acid (FOLVITE) 902 MCG tablet Take 800 mcg by mouth daily.   Yes [provider]  montelukast (SINGULAIR) 10 MG tablet Take 1 tablet (10 mg total) by mouth at bedtime. 03/13/21  Yes Breeback, Jade L, PA-C  omeprazole (PRILOSEC OTC) 20 MG tablet Take 20 mg by mouth daily as needed (acid reflux).   Yes [provider]  pseudoephedrine (SUDAFED) 30 MG tablet Take 30 mg by mouth daily as needed for congestion.   Yes [provider]  vitamin B-12 (CYANOCOBALAMIN) 1000 MCG tablet Take 1 tablet (1,000 mcg total) by mouth daily. 03/15/21 06/23/21 Yes Pokhrel, Laxman, MD  dexamethasone (DECADRON) 4 MG tablet Take 2 tablets (8 mg total) by mouth daily. Start the day after chemotherapy for 3 days. Take with food. 05/08/21   [name], MD  fluticasone (FLONASE) 50 MCG/ACT nasal spray Place 2 sprays into both nostrils daily. Patient not taking: Reported on 05/09/2021 09/18/16   Emeterio Reeve, DO  folic acid (FOLVITE) 1 MG tablet Take 1 tablet (1 mg total) by mouth daily. Patient not taking: Reported on 05/09/2021 03/15/21 06/23/21  Flora Lipps, MD  iron polysaccharides (NIFEREX) 150 MG capsule Take 1 capsule (150 mg total) by mouth daily. Patient not taking: Reported on 05/09/2021 03/15/21 06/23/21  Flora Lipps, MD  lidocaine-prilocaine (EMLA) cream Apply to affected area once 05/08/21   [name], MD  loperamide (IMODIUM) 2 MG capsule Take 2 at onset of diarrhea, then 1 every 2hrs until 12hr without a BM. May take  2 tab every 4hrs at bedtime. If diarrhea recurs repeat. 05/08/21   [name], MD  Multiple Vitamins-Minerals (MULTIVITAMIN WITH MINERALS) tablet Take 1 tablet by mouth daily. Patient not taking: Reported on 05/09/2021 03/15/21 06/23/21  Pokhrel, Corrie Mckusick, MD  ondansetron (ZOFRAN) 8 MG  tablet Take 1 tablet (8 mg total) by mouth 2 (two) times daily as needed. Start on day 3 after chemotherapy. 05/08/21   [name], MD  pantoprazole (PROTONIX) 40 MG tablet Take 1 tablet (40 mg total) by mouth daily. Patient not taking: Reported on 05/09/2021 03/13/21   Donella Stade, PA-C  prochlorperazine (COMPAZINE) 10 MG tablet Take 1 tablet (10 mg total) by mouth every 6 (six) hours as needed (Nausea or vomiting). 05/08/21   [name], MD     Family History  Problem Relation Age of Onset   Lung cancer Mother    Cancer Father    Diabetes Maternal Grandmother    Pancreatic cancer Maternal Grandfather    Multiple sclerosis Maternal Aunt    Alcohol abuse Maternal Uncle     Social History   Socioeconomic History   Marital status: Single    Spouse name: Not on file   Number of children: Not on file   Years of education: Not on file   Highest education level: Not on file  Occupational History   Not on file  Tobacco Use   Smoking status: Never   Smokeless tobacco: Never  Vaping Use   Vaping Use: Never used  Substance and Sexual Activity   Alcohol use: Yes    Comment: occasionally   Drug use: Not Currently   Sexual activity: Not on file  Other Topics Concern   Not on file  Social History Narrative   Not on file   Social Determinants of Health   Financial Resource Strain: Not on file  Food Insecurity: Not on file  Transportation Needs: Not on file  Physical Activity: Not on file  Stress: Not on file  Social Connections: Not on file    Review of Systems: A 12 point ROS discussed and pertinent positives are indicated in the HPI above.  All other systems are negative.  Review of Systems  Constitutional:  Negative for appetite change and fatigue.  Respiratory:  Positive for cough.   Cardiovascular:  Negative for chest pain and leg swelling.  Gastrointestinal:  Negative for abdominal pain, diarrhea, nausea and vomiting.  Neurological:  Negative for  dizziness and headaches.   Vital Signs: BP 115/65    Pulse 78    Temp 97.9 F (36.6 C) (Oral)    Resp 18    Ht 5\' 1"  (1.549 m)    Wt 196 lb (88.9 kg)    SpO2 100%    BMI 37.03 kg/m   Physical Exam Constitutional:      General: She is not in acute distress.    Appearance: Normal appearance. She is not ill-appearing.  HENT:     Mouth/Throat:     Mouth: Mucous membranes are moist.     Pharynx: Oropharynx is clear.  Cardiovascular:     Rate and Rhythm: Normal rate and regular rhythm.     Pulses: Normal pulses.     Heart sounds: Normal heart sounds.  Pulmonary:     Effort: Pulmonary effort is normal.     Breath sounds: Normal breath sounds.  Abdominal:     General: Bowel sounds are normal.     Palpations: Abdomen is soft.  Musculoskeletal:  Right lower leg: No edema.     Left lower leg: No edema.  Skin:    General: Skin is warm and dry.  Neurological:     Mental Status: She is alert and oriented to person, place, and time.    Imaging: MR Pelvis W Wo Contrast  Result Date: 04/25/2021 CLINICAL DATA:  Pelvic pain, postmenopausal bleeding for 3 weeks, history of endometrial ablation, recent diagnosis colon cancer EXAM: MRI PELVIS WITHOUT AND WITH CONTRAST TECHNIQUE: Multiplanar multisequence MR imaging of the pelvis was performed both before and after administration of intravenous contrast. CONTRAST:  63mL GADAVIST GADOBUTROL 1 MMOL/ML IV SOLN COMPARISON:  None. FINDINGS: Urinary Tract:  No abnormality visualized. Bowel:  Unremarkable visualized pelvic bowel loops. Vascular/Lymphatic: No pathologically enlarged lymph nodes. No significant vascular abnormality seen. Reproductive: Uterine fibroids, most notably a subserosal fibroid of the posterior uterine fundus. There are no fibroids with significant submucosal component identified. Abnormally thickened postmenopausal endometrium measuring at least 1.6 cm with hemorrhagic or proteinaceous fluid in the endometrial cavity. Multiple large  nabothian cysts of the cervix. Simple, benign subcentimeter ovarian cysts. Other:  None. Musculoskeletal: No suspicious bone lesions identified. IMPRESSION: 1. Abnormally thickened postmenopausal endometrium measuring at least 1.6 cm with hemorrhagic or proteinaceous fluid in the endometrial cavity. Findings are suspicious for endometrial hyperplasia or carcinoma. Consider tissue sampling. 2. No evidence of pelvic lymphadenopathy or metastatic disease. 3. No evidence of colon malignancy in the pelvis per report of recent diagnosis of colon cancer. 4. Uterine fibroids. These results will be called to the ordering clinician or representative by the Radiologist Assistant, and communication documented in the PACS or [employer]. Electronically Signed   By: Delanna Ahmadi M.D.   On: 04/25/2021 09:46   MR Abdomen W Wo Contrast  Result Date: 05/02/2021 CLINICAL DATA:  Follow-up liver and splenic lesions incidentally identified by CT chest, recent diagnosis colon cancer EXAM: MRI ABDOMEN WITHOUT AND WITH CONTRAST TECHNIQUE: Multiplanar multisequence MR imaging of the abdomen was performed both before and after the administration of intravenous contrast. CONTRAST:  44mL GADAVIST GADOBUTROL 1 MMOL/ML IV SOLN COMPARISON:  CT chest, 04/05/2021 FINDINGS: Lower chest: No acute findings. Hepatobiliary: There is a large, heterogeneously contrast enhancing mass of the posterior right lobe of the liver, hepatic segments VI/VII, measuring 9.4 x 9.2 cm (series 20, image 30). Segmental biliary dilatation in the inferior tip of the right lobe of the liver, hepatic segment VI (series 12, image 56). Multiple additional fluid signal simple cysts throughout the liver parenchyma. Small gallstones in the gallbladder. No biliary ductal dilatation. Pancreas: No mass, inflammatory changes, or other parenchymal abnormality identified. Spleen: Within normal limits in size and appearance. There is a lobulated, thinly septated fluid signal  subcapsular lesion of the superior spleen measuring 1.6 x 1.4 cm (series 4, image 14, series 20, image 14). Adrenals/Urinary Tract: No masses identified. No evidence of hydronephrosis. Stomach/Bowel: Visualized portions within the abdomen are unremarkable. Vascular/Lymphatic: There is a heterogeneously contrast enhancing aortocaval node measuring 2.1 x 1.9 cm (series 20, image 67). No abdominal aortic aneurysm demonstrated. Other:  None. Musculoskeletal: No suspicious bone lesions identified. IMPRESSION: 1. There is a large, heterogeneously contrast enhancing mass of the posterior right lobe of the liver, measuring 9.4 x 9.2 cm, most consistent with a large hepatic metastasis given recent diagnosis of colon cancer. Although not favored, general differential considerations include cholangiocarcinoma and hepatocellular carcinoma. 2. There is a heterogeneously contrast enhancing aortocaval node measuring 2.1 x 1.9 cm, suspicious for a necrotic nodal  metastasis. 3. Lobulated, thinly septated fluid signal subcapsular lesion of the superior spleen measuring 1.6 x 1.4 cm, most consistent with a benign splenic cyst. 4. Cholelithiasis. These results will be called to the ordering clinician or representative by the Radiologist Assistant, and communication documented in the PACS or [employer]. Electronically Signed   By: Delanna Ahmadi M.D.   On: 05/02/2021 10:36   NM PET Image Initial (PI) Skull Base To Thigh  Result Date: 05/03/2021 CLINICAL DATA:  Initial treatment strategy for colorectal cancer. EXAM: NUCLEAR MEDICINE PET SKULL BASE TO THIGH TECHNIQUE: 9.62 mCi F-18 FDG was injected intravenously. Full-ring PET imaging was performed from the skull base to thigh after the radiotracer. CT data was obtained and used for attenuation correction and anatomic localization. Fasting blood glucose: 87 mg/dl COMPARISON:  MRI 05/01/2021.  Chest CT  04/05/2021 FINDINGS: Mediastinal blood pool activity: SUV max 2.09 Liver  activity: SUV max NA NECK: No hypermetabolic lymph nodes in the neck. Incidental CT findings: Chronic appearing right maxillary sinus disease is noted with mild hypermetabolism. CHEST: No hypermetabolic mediastinal or hilar nodes. No suspicious pulmonary nodules on the CT scan. Incidental CT findings: none ABDOMEN/PELVIS: The 10 cm right hepatic lobe mass is markedly hypermetabolic with SUV max of 06.30. Central necrosis is noted. No other hepatic lesions are identified (other than a small segment 6 cyst). 9 mm hepatoduodenal ligament node is hypermetabolic with SUV max of 16.01. Lower retroperitoneal node measures 13.5 mm and SUV max is 7.24. The patient's ascending colon cancer is hypermetabolic with SUV max of 09.32. Ileocolic nodes not show any definite hypermetabolism. No findings for adrenal gland metastasis. Incidental CT findings: 15 mm rim calcified gallstone in the gallbladder along with other smaller gallstones. Mildly enlarged fibroid uterus. Simple 2 cm right ovarian cyst. No follow-up is necessary. SKELETON: No focal hypermetabolic activity to suggest skeletal metastasis. Incidental CT findings: none IMPRESSION: 1. Hypermetabolic ascending colon lesion consistent with known primary colonic neoplasm. 2. 10 cm metastatic lesion in the right hepatic lobe. 3. Enlarged and hypermetabolic abdominal lymph nodes. 4. No findings for metastatic disease involving the chest or bony structures. Electronically Signed   By: Marijo Sanes M.D.   On: 05/03/2021 15:07    Labs:  CBC: Recent Labs    03/15/21 0827 03/23/21 0000 03/23/21 1107 03/24/21 1452 04/24/21 1036  WBC 4.8 6.0  --  7.3 6.7  HGB 7.3* 8.0* 6.4* 7.7* 9.7*  HCT 26.9* 29.4*  --  28.1* 33.4*  PLT 272 297  --  307 280    COAGS: No results for input(s): INR, APTT in the last 8760 hours.  BMP: Recent Labs    03/14/21 1250 03/15/21 0827 03/23/21 0000 03/24/21 1452 04/24/21 1207  NA 135 137 139 137 141  K 3.5 3.8 3.9 3.6 4.3  CL  103 105 103 102 104  CO2 24 25 28 27 30   GLUCOSE 101* 92 121* 132* 102*  BUN 5* 5* 7 6 5*  CALCIUM 8.6* 8.6* 8.8 9.5 9.4  CREATININE 0.62 0.41* 0.56 0.63 0.61  GFRNONAA >60 >60  --  >60 >60    LIVER FUNCTION TESTS: Recent Labs    03/13/21 0000 03/14/21 1250 03/24/21 1452 04/24/21 1207  BILITOT 0.3 0.2* 0.4 0.2*  AST 15 21 22 21   ALT 10 15 12 12   ALKPHOS  --  104 104 118  PROT 7.1 7.3 7.3 6.3*  ALBUMIN  --  3.7 4.2 3.8    TUMOR MARKERS: No results for input(s):  AFPTM, CEA, CA199, CHROMGRNA in the last 8760 hours.  Assessment and Plan:  Colon cancer with metastases; chemotherapy pending: Rebecca Bullock, 55 year old female, presents today to the [facility] department presents today for an image-guided port-a-catheter placement.  Risks and benefits of image-guided port-a-catheter placement were discussed with the patient including, but not limited to bleeding, infection, pneumothorax, or fibrin sheath development and need for additional procedures.  All of the patient's questions were answered, patient is agreeable to proceed. She has been NPO.   Consent signed and in chart.  Thank you for this interesting consult.  I greatly enjoyed meeting Rebecca Bullock and look forward to participating in their care.  A copy of this report was sent to the requesting provider on this date.  Electronically Signed: Soyla Dryer, AGACNP-BC [phone] 05/11/2021, 10:12 AM   I spent a total of  30 Minutes   in face to face in clinical consultation, greater than 50% of which was counseling/coordinating care for port-a-catheter placement.

## 2021-05-11 NOTE — Progress Notes (Signed)
Patient getting port placed today for chemo to start on 05/16/2021. Per Chemo Educator, patient has requested a referral to social work for assistance with financial resources in the community.   Referral order placed.   Oncology Nurse Navigator Documentation  Oncology Nurse Navigator Flowsheets 05/11/2021  Abnormal Finding Date -  Confirmed Diagnosis Date -  Diagnosis Status -  Phase of Treatment -  Chemotherapy Pending- Reason: -  Navigator Follow Up Date: 05/16/2021  Navigator Follow Up Reason: Chemotherapy  Navigator Location [facility]  Navigator Encounter Type Appt/Treatment Plan Review  Telephone -  Patient Visit Type MedOnc  Treatment Phase Pre-Tx/Tx Discussion  Barriers/Navigation Needs Coordination of Care;Education  Education -  Interventions Referrals  Acuity Level 2-Minimal Needs (1-2 Barriers Identified)  Referrals Social Work  English as a second language teacher of Care -  Education Method -  Support Groups/Services Friends and Family  Time Spent with Patient 15

## 2021-05-12 ENCOUNTER — Encounter: Payer: Self-pay | Admitting: General Practice

## 2021-05-12 NOTE — Progress Notes (Signed)
[facility] CSW Progress Notes  Call to patient per referral for financial distress.  No answer, left generic VM w my contact information as VM was not personally identified.  Edwyna Shell, LCSW Clinical Social Worker Phone:  [phone]

## 2021-05-15 ENCOUNTER — Encounter: Payer: Self-pay | Admitting: *Deleted

## 2021-05-15 NOTE — Progress Notes (Signed)
Patient called with concern that her port was moving around. She states that the port was lower down on her chest when it was placed and now she feels like it's sitting right under the incision.   Explained to the patient that the port can shift slightly but it shouldn't be very mobile. Offered to bring her into the office today for a nurse to assess but she declined. She stated she would have the nurse look at it tomorrow during her scheduled appointment.   Note added to appointment to alert nursing staff.  Oncology Nurse Navigator Documentation  Oncology Nurse Navigator Flowsheets 05/15/2021  Abnormal Finding Date -  Confirmed Diagnosis Date -  Diagnosis Status -  Phase of Treatment -  Chemotherapy Pending- Reason: -  Navigator Follow Up Date: 05/16/2021  Navigator Follow Up Reason: Chemotherapy  Navigator Location [facility]  Navigator Encounter Type Telephone  Telephone Incoming Call;Patient Update  Patient Visit Type MedOnc  Treatment Phase Pre-Tx/Tx Discussion  Barriers/Navigation Needs Coordination of Care;Education  Education Other  Interventions Education;Psycho-Social Support  Acuity Level 2-Minimal Needs (1-2 Barriers Identified)  Referrals -  Coordination of Care -  Education Method Verbal  Support Groups/Services Friends and Family  Time Spent with Patient 15

## 2021-05-16 ENCOUNTER — Inpatient Hospital Stay: Payer: 59

## 2021-05-16 ENCOUNTER — Other Ambulatory Visit: Payer: Self-pay

## 2021-05-16 ENCOUNTER — Telehealth (HOSPITAL_COMMUNITY): Payer: Self-pay | Admitting: General Practice

## 2021-05-16 ENCOUNTER — Encounter: Payer: Self-pay | Admitting: *Deleted

## 2021-05-16 VITALS — BP 123/55 | HR 80 | Temp 98.3°F | Resp 16

## 2021-05-16 DIAGNOSIS — C189 Malignant neoplasm of colon, unspecified: Secondary | ICD-10-CM

## 2021-05-16 DIAGNOSIS — Z5111 Encounter for antineoplastic chemotherapy: Secondary | ICD-10-CM | POA: Diagnosis not present

## 2021-05-16 LAB — CBC WITH DIFFERENTIAL (CANCER CENTER ONLY)
Abs Immature Granulocytes: 0.01 10*3/uL (ref 0.00–0.07)
Basophils Absolute: 0 10*3/uL (ref 0.0–0.1)
Basophils Relative: 1 %
Eosinophils Absolute: 0.5 10*3/uL (ref 0.0–0.5)
Eosinophils Relative: 8 %
HCT: 30.9 % — ABNORMAL LOW (ref 36.0–46.0)
Hemoglobin: 9.2 g/dL — ABNORMAL LOW (ref 12.0–15.0)
Immature Granulocytes: 0 %
Lymphocytes Relative: 19 %
Lymphs Abs: 1 10*3/uL (ref 0.7–4.0)
MCH: 22.3 pg — ABNORMAL LOW (ref 26.0–34.0)
MCHC: 29.8 g/dL — ABNORMAL LOW (ref 30.0–36.0)
MCV: 74.8 fL — ABNORMAL LOW (ref 80.0–100.0)
Monocytes Absolute: 0.4 10*3/uL (ref 0.1–1.0)
Monocytes Relative: 7 %
Neutro Abs: 3.6 10*3/uL (ref 1.7–7.7)
Neutrophils Relative %: 65 %
Platelet Count: 275 10*3/uL (ref 150–400)
RBC: 4.13 MIL/uL (ref 3.87–5.11)
RDW: 24.3 % — ABNORMAL HIGH (ref 11.5–15.5)
WBC Count: 5.5 10*3/uL (ref 4.0–10.5)
nRBC: 0 % (ref 0.0–0.2)

## 2021-05-16 LAB — CMP (CANCER CENTER ONLY)
ALT: 13 U/L (ref 0–44)
AST: 26 U/L (ref 15–41)
Albumin: 3.1 g/dL — ABNORMAL LOW (ref 3.5–5.0)
Alkaline Phosphatase: 98 U/L (ref 38–126)
Anion gap: 8 (ref 5–15)
BUN: 5 mg/dL — ABNORMAL LOW (ref 6–20)
CO2: 26 mmol/L (ref 22–32)
Calcium: 8.5 mg/dL — ABNORMAL LOW (ref 8.9–10.3)
Chloride: 102 mmol/L (ref 98–111)
Creatinine: 0.49 mg/dL (ref 0.44–1.00)
GFR, Estimated: >60 mL/min (ref >60)
Glucose, Bld: 127 mg/dL — ABNORMAL HIGH (ref 70–99)
Potassium: 3.6 mmol/L (ref 3.5–5.1)
Sodium: 136 mmol/L (ref 135–145)
Total Bilirubin: 0.2 mg/dL — ABNORMAL LOW (ref 0.3–1.2)
Total Protein: 6.7 g/dL (ref 6.5–8.1)

## 2021-05-16 MED ORDER — SODIUM CHLORIDE 0.9% FLUSH: 10.0000 mL | INTRAVENOUS | Status: DC | PRN

## 2021-05-16 MED ORDER — SODIUM CHLORIDE 0.9 % IV SOLN
40.0000 mg | Freq: Once | INTRAVENOUS | Status: AC
  Administered 2021-05-16: 13:00:00 40 mg via INTRAVENOUS
  Filled 2021-05-16: qty 4

## 2021-05-16 MED ORDER — METHYLPREDNISOLONE SODIUM SUCC 125 MG IJ SOLR
125.0000 mg | Freq: Once | INTRAMUSCULAR | Status: AC
  Administered 2021-05-16: 13:00:00 125 mg via INTRAVENOUS

## 2021-05-16 MED ORDER — SODIUM CHLORIDE 0.9 % IV SOLN
150.0000 mg | Freq: Once | INTRAVENOUS | Status: AC
  Administered 2021-05-16: 11:00:00 150 mg via INTRAVENOUS
  Filled 2021-05-16: qty 150

## 2021-05-16 MED ORDER — SODIUM CHLORIDE 0.9 % IV SOLN
165.0000 mg/m2 | Freq: Once | INTRAVENOUS | Status: AC
  Administered 2021-05-16: 12:00:00 320 mg via INTRAVENOUS
  Filled 2021-05-16: qty 10

## 2021-05-16 MED ORDER — OXALIPLATIN CHEMO INJECTION 100 MG/20ML
85.0000 mg/m2 | Freq: Once | INTRAVENOUS | Status: AC
  Administered 2021-05-16: 15:00:00 165 mg via INTRAVENOUS
  Filled 2021-05-16: qty 33

## 2021-05-16 MED ORDER — ATROPINE SULFATE 1 MG/ML IV SOLN
0.5000 mg | Freq: Once | INTRAVENOUS | Status: DC | PRN
  Filled 2021-05-16: qty 1

## 2021-05-16 MED ORDER — HEPARIN SOD (PORK) LOCK FLUSH 100 UNIT/ML IV SOLN: 500.0000 [IU] | Freq: Once | INTRAVENOUS | Status: DC | PRN

## 2021-05-16 MED ORDER — LEUCOVORIN CALCIUM INJECTION 350 MG
400.0000 mg/m2 | Freq: Once | INTRAVENOUS | Status: AC
  Administered 2021-05-16: 15:00:00 772 mg via INTRAVENOUS
  Filled 2021-05-16: qty 38.6

## 2021-05-16 MED ORDER — SODIUM CHLORIDE 0.9 % IV SOLN
10.0000 mg | Freq: Once | INTRAVENOUS | Status: AC
  Administered 2021-05-16: 11:00:00 10 mg via INTRAVENOUS
  Filled 2021-05-16: qty 10

## 2021-05-16 MED ORDER — SODIUM CHLORIDE 0.9 % IV SOLN: 5.0000 mg/kg | Freq: Once | INTRAVENOUS | Status: DC

## 2021-05-16 MED ORDER — DEXTROSE 5 % IV SOLN: Freq: Once | INTRAVENOUS | Status: AC

## 2021-05-16 MED ORDER — SODIUM CHLORIDE 0.9 % IV SOLN: Freq: Once | INTRAVENOUS | Status: AC

## 2021-05-16 MED ORDER — PALONOSETRON HCL INJECTION 0.25 MG/5ML
0.2500 mg | Freq: Once | INTRAVENOUS | Status: AC
  Administered 2021-05-16: 11:00:00 0.25 mg via INTRAVENOUS
  Filled 2021-05-16: qty 5

## 2021-05-16 MED ORDER — DIPHENHYDRAMINE HCL 50 MG/ML IJ SOLN
25.0000 mg | Freq: Once | INTRAMUSCULAR | Status: AC
  Administered 2021-05-16: 13:00:00 25 mg via INTRAVENOUS

## 2021-05-16 MED ORDER — SODIUM CHLORIDE 0.9 % IV SOLN
2400.0000 mg/m2 | INTRAVENOUS | Status: DC
  Administered 2021-05-16: 17:00:00 4650 mg via INTRAVENOUS
  Filled 2021-05-16: qty 93

## 2021-05-16 NOTE — Progress Notes (Signed)
Hypersensitivity Reaction note  Date of event: 05/16/21 Time of event: 1305 Generic name of drug involved: Irinotecan Name of provider notified of the hypersensitivity reaction: Lottie Dawson, NP  Was agent that likely caused hypersensitivity reaction added to Allergies List within EMR? Yes Chain of events including reaction signs/symptoms, treatment administered, and outcome (e.g., drug resumed; drug discontinued; sent to Emergency Department; etc.)   1305 Patient complained of visual disturbances in bilateral eyes, tingling in upper lip, and flushed feeling in her face. Irinotecan stopped, normal saline started wide open, Lottie Dawson, NP notified. Vital signs obtained. Order given and carried out for 25 mg Benadryl IVP, Pepcid 40 mg IVPB, and Solu-medrol 125 mg IVP.   1315 Patient states symptoms of reaction are improving. Vital signs obtained.   1330 Patient denies tingling in lips, flushing sensation and visual disturbances.   1335 Patient complains of new onset stomach "uneasiness". Patient states her stomach is not cramping. Patient denies the need for the bathroom. Patient states she has not had this feeling before. Vital signs obtained. Lottie Dawson, NP notified and at chairside. Continue pepcid for now and monitor stomach uneasiness per Lottie Dawson, NP.   [phone] Patient up to bathroom. Patient states stomach "uneasiness" is resolving. Patient denied diarrhea or cramping.   1347 Patient eating and drinking in infusion room. Lottie Dawson, NP notified.   1407 Irinotecan restarted per Lottie Dawson, NP   1430 Irinotecan finished without any complaint or concerns. Vitals stable.    Rebecca Bullock 05/16/2021 1:30 PM

## 2021-05-16 NOTE — Patient Instructions (Signed)

## 2021-05-16 NOTE — Progress Notes (Signed)
Patient is here to start treatment. The infusion RN assessed her port concerns and felt that the port was WNL. She states the area is still sore.   Her biggest concern at this time is working while going through treatment.   She has no questions at this time. Reviewed parameters for calling the office. Covered the on-call service. She is aware of her upcoming appointments.   Oncology Nurse Navigator Documentation  Oncology Nurse Navigator Flowsheets 05/16/2021  Abnormal Finding Date -  Confirmed Diagnosis Date -  Diagnosis Status -  Phase of Treatment -  Chemotherapy Pending- Reason: -  Navigator Follow Up Date: 05/31/2021  Navigator Follow Up Reason: Follow-up Appointment;Chemotherapy  Production assistant, radio Encounter Type Treatment  Telephone -  Patient Visit Type MedOnc  Treatment Phase Pre-Tx/Tx Discussion  Barriers/Navigation Needs Coordination of Care;Education  Education Other  Interventions Education;Psycho-Social Support  Acuity Level 2-Minimal Needs (1-2 Barriers Identified)  Referrals -  Coordination of Care -  Education Method Verbal  Support Groups/Services Friends and Family  Time Spent with Patient 15

## 2021-05-16 NOTE — Patient Instructions (Signed)
[facility]  Discharge Instructions: Thank you for choosing [facility] to provide your oncology and hematology care.   If you have a lab appointment with the [facility], please go directly to the [facility] and check in at the registration area.  Wear comfortable clothing and clothing appropriate for easy access to any Portacath or PICC line.   We strive to give you quality time with your provider. You may need to reschedule your appointment if you arrive late (15 or more minutes).  Arriving late affects you and other patients whose appointments are after yours.  Also, if you miss three or more appointments without notifying the office, you may be dismissed from the clinic at the providers discretion.      For prescription refill requests, have your pharmacy contact our office and allow 72 hours for refills to be completed.    Today you received the following chemotherapy and/or immunotherapy agents:  Irinotecan, Leucovorin, Oxaliplatin and 5-FU.       To help prevent nausea and vomiting after your treatment, we encourage you to take your nausea medication as directed.  BELOW ARE SYMPTOMS THAT SHOULD BE REPORTED IMMEDIATELY: *FEVER GREATER THAN 100.4 F (38 C) OR HIGHER *CHILLS OR SWEATING *NAUSEA AND VOMITING THAT IS NOT CONTROLLED WITH YOUR NAUSEA MEDICATION *UNUSUAL SHORTNESS OF BREATH *UNUSUAL BRUISING OR BLEEDING *URINARY PROBLEMS (pain or burning when urinating, or frequent urination) *BOWEL PROBLEMS (unusual diarrhea, constipation, pain near the anus) TENDERNESS IN MOUTH AND THROAT WITH OR WITHOUT PRESENCE OF ULCERS (sore throat, sores in mouth, or a toothache) UNUSUAL RASH, SWELLING OR PAIN  UNUSUAL VAGINAL DISCHARGE OR ITCHING   Items with * indicate a potential emergency and should be followed up as soon as possible or go to the Emergency Department if any problems should occur.  Please show the CHEMOTHERAPY ALERT CARD or  IMMUNOTHERAPY ALERT CARD at check-in to the Emergency Department and triage nurse. Should you have questions after your visit or need to cancel or reschedule your appointment, please contact [facility]  [phone] and follow the prompts.  Office hours are 8:00 a.m. to 4:30 p.m. Monday - Friday. Please note that voicemails left after 4:00 p.m. may not be returned until the following business day.  We are closed weekends and major holidays. You have access to a nurse at all times for urgent questions. Please call the main number to the clinic [phone] and follow the prompts.  For any non-urgent questions, you may also contact your provider using MyChart. We now offer e-Visits for anyone 34 and older to request care online for non-urgent symptoms. For details visit [email or web address].   Also download the MyChart app! Go to the app store, search "MyChart", open the app, select [facility], and log in with your MyChart username and password.  Due to Covid, a mask is required upon entering the hospital/clinic. If you do not have a mask, one will be given to you upon arrival. For doctor visits, patients may have 1 support person aged 40 or older with them. For treatment visits, patients cannot have anyone with them due to current Covid guidelines and our immunocompromised population.

## 2021-05-16 NOTE — Telephone Encounter (Signed)
[facility] CSW Progress Notes  Second call to patient per referral, no answer, left VM w my contact information and encouragement to return my call.  Edwyna Shell, LCSW Clinical Social Worker Phone:  [phone]

## 2021-05-17 ENCOUNTER — Encounter: Payer: Self-pay | Admitting: General Practice

## 2021-05-17 ENCOUNTER — Encounter: Payer: Self-pay | Admitting: *Deleted

## 2021-05-17 NOTE — Progress Notes (Signed)
[facility] CSW Progress Notes  Third outreach call to patient per referral, request for help w community support resources and financial assistance.  Unable to reach by phone.  Sent email with information about [facility] and [employer].  Left VM w my contact information and encouragement to return call at her convenience.  Closing referral at this time, please reconsult as needed, we will gladly respond to patient when she calls [place].  Edwyna Shell, LCSW Clinical Social Worker Phone:  [phone]

## 2021-05-17 NOTE — Progress Notes (Signed)
Called patient to check on her after starting her first cycle of chemotherapy yesterday. She also had a suspected infusion reaction while in infusion.   Rebecca Bullock is doing okay today. She states she still has some "fuzziness or blurriness" to her vision and continues to "feel weird". She felt ok enough to go to work today.   She states she slept about 4 hours last night. She woke up this morning with some nausea. She took an antiemetic and ate about half a bowl of oatmeal. She states she's jittery and anxious, and also seems to have increased sweating.   She states "if this is what I have to deal with while going through chemo, I'm okay with it." Reviewed with patient that many of her symptoms may be related to her steroid. She agreed that in the past she's not tolerated steroids well.   Reviewed her prn medication. Encouraged her to call if any issues. She is aware of her appointment tomorrow for pump stop.   Reviewed the above with Dr [name]. No new orders received.   Oncology Nurse Navigator Documentation  Oncology Nurse Navigator Flowsheets 05/17/2021  Abnormal Finding Date -  Confirmed Diagnosis Date -  Diagnosis Status -  Phase of Treatment Chemo  Chemotherapy Pending- Reason: -  Chemotherapy Actual Start Date: 05/16/2021  Navigator Follow Up Date: 05/31/2021  Navigator Follow Up Reason: Follow-up Appointment;Chemotherapy  Navigator Restaurant manager, fast food Encounter Type Telephone  Telephone Patient Update;Symptom Mgt;Education;Outgoing Call  Treatment Initiated Date 05/16/2021  Patient Visit Type MedOnc  Treatment Phase Active Tx  Barriers/Navigation Needs Coordination of Care;Education  Education Pain/ Symptom Management  Interventions Education;Psycho-Social Support  Acuity Level 2-Minimal Needs (1-2 Barriers Identified)  Referrals -  Coordination of Care -  Education Method Verbal  Support Groups/Services Friends and Family  Time Spent with Patient 30

## 2021-05-18 ENCOUNTER — Other Ambulatory Visit: Payer: Self-pay

## 2021-05-18 ENCOUNTER — Inpatient Hospital Stay: Payer: 59

## 2021-05-18 ENCOUNTER — Encounter: Payer: Self-pay | Admitting: General Practice

## 2021-05-18 VITALS — BP 147/52 | HR 73 | Temp 98.0°F | Resp 17

## 2021-05-18 DIAGNOSIS — C787 Secondary malignant neoplasm of liver and intrahepatic bile duct: Secondary | ICD-10-CM

## 2021-05-18 DIAGNOSIS — Z5111 Encounter for antineoplastic chemotherapy: Secondary | ICD-10-CM | POA: Diagnosis not present

## 2021-05-18 MED ORDER — SODIUM CHLORIDE 0.9% FLUSH
10.0000 mL | INTRAVENOUS | Status: DC | PRN
  Administered 2021-05-18: 16:00:00 10 mL

## 2021-05-18 MED ORDER — HEPARIN SOD (PORK) LOCK FLUSH 100 UNIT/ML IV SOLN
500.0000 [IU] | Freq: Once | INTRAVENOUS | Status: AC | PRN
  Administered 2021-05-18: 16:00:00 500 [IU]

## 2021-05-18 NOTE — Progress Notes (Signed)
[facility] Work  Initial Assessment   Rebecca Bullock is a 55 y.o. year old female contacted by phone. Clinical Social Work was referred by medical oncologist for assessment of psychosocial needs.   SDOH (Social Determinants of Health) assessments performed: Yes   Distress Screen completed: No No flowsheet data found.    Family/Social Information:  Housing Arrangement: has son and daughter, both are college students.  She is leasing agent for apartment complex, gets discount on rent "as long as I am working."  "If everything goes as well as it has been for past two days, I dont know if I can continue to work." Family members/support persons in your life? Family Transportation concerns: yes, is behind one car payment  Employment: Working part time. Income source: Employment Financial concerns: Yes, current concerns Type of concern: Medical bills and "I am concerned about how to pay medical bills, how to get them under control."  Is behind on car payment, worries about trying to pay rent.  "What do I qualify for."   Food access concerns: no Religious or spiritual practice: did not assess Medication Concerns: none noted, however, insurance copays are high in general  Services Currently in place:  none  Coping/ Adjustment to diagnosis: Patient understands treatment plan and what happens next? yes, diagnosed with Stage IV colon cancer, has begun chemotherapy, anticipates she will have surgery at some point.  She is continuing to work throughout treatment, this has seemed possible based on her response to her first treatment.  She was diagnosed after beginning her current job several months ago and acquiring health insurance which allowed her to seek care at a PCP.  This eventually resulted in a diagnosis of Stage IV colon cancer.   Concerns about diagnosis and/or treatment: How I will pay for the services I need and how will I be able to maintain my living situation if I cannot work.   Does not have significant outside support, her children are all over 18 but in college. Patient reported stressors: Housing, Insurance underwriter, Work/ school, Finances, Children, and Adjusting to my illness; her insurance copays are very high, she cannot afford to pay them entirely.  She is also getting behind in car payments and other bills.   Hopes and priorities: wants to get through treatment, be able to work full time again.   Current coping skills/ strengths: Ability for insight , Average or above average intelligence , Capable of independent living , Communication skills , and General fund of knowledge     SUMMARY: Current SDOH Barriers:  Financial constraints related to limited insurance, high copays, no outside support, concerns over whether to apply for disability - wants to continue to work during treatment.  "I dont know when the best time is to apply, I dont want to stop working but I might have to."  Aware that she will not be able to return to work right away after surgery.  Limited community support mentioned  Although she hopes to be able to continue to work, she is aware that this may not be possible.  She is unsure of timing related to applying for social Security disability benefits.  She is not ready to apply now as she continues to work.    Clinical Social Work Clinical Goal(s):  patient will work with SW to address concerns related to finances and support network  Interventions: Discussed common feeling and emotions when being diagnosed with cancer, and the importance of support during treatment Informed patient of  the support team roles and support services at [facility] Provided [facility] contact information and encouraged patient to call with any questions or concerns Referred patient to [facility], Triage Cancer, [facility].   Also asked Bebe Liter, [facility] Fin Advocate to connect w her re [employer]  as she is worried about medical  bills.     Follow Up Plan: Patient will contact CSW with any support or resource needs Patient verbalizes understanding of plan: Yes    Beverely Pace , [place], LCSW Clinical Social Worker Phone:  [phone]

## 2021-05-23 ENCOUNTER — Other Ambulatory Visit: Payer: Self-pay

## 2021-05-23 ENCOUNTER — Inpatient Hospital Stay: Payer: 59

## 2021-05-23 DIAGNOSIS — C189 Malignant neoplasm of colon, unspecified: Secondary | ICD-10-CM

## 2021-05-23 DIAGNOSIS — Z5111 Encounter for antineoplastic chemotherapy: Secondary | ICD-10-CM | POA: Diagnosis not present

## 2021-05-23 LAB — URINALYSIS, COMPLETE (UACMP) WITH MICROSCOPIC
Glucose, UA: NEGATIVE mg/dL
Ketones, ur: NEGATIVE mg/dL
Leukocytes,Ua: NEGATIVE
Nitrite: NEGATIVE
Protein, ur: 30 mg/dL — AB
RBC / HPF: >50 RBC/hpf (ref 0–5)
Specific Gravity, Urine: >=1.03 (ref 1.005–1.030)
pH: 6 (ref 5.0–8.0)

## 2021-05-23 LAB — CMP (CANCER CENTER ONLY)
ALT: 14 U/L (ref 0–44)
AST: 12 U/L — ABNORMAL LOW (ref 15–41)
Albumin: 3.8 g/dL (ref 3.5–5.0)
Alkaline Phosphatase: 120 U/L (ref 38–126)
Anion gap: 9 (ref 5–15)
BUN: 15 mg/dL (ref 6–20)
CO2: 27 mmol/L (ref 22–32)
Calcium: 9 mg/dL (ref 8.9–10.3)
Chloride: 101 mmol/L (ref 98–111)
Creatinine: 0.68 mg/dL (ref 0.44–1.00)
GFR, Estimated: >60 mL/min (ref >60)
Glucose, Bld: 92 mg/dL (ref 70–99)
Potassium: 3.3 mmol/L — ABNORMAL LOW (ref 3.5–5.1)
Sodium: 137 mmol/L (ref 135–145)
Total Bilirubin: 0.3 mg/dL (ref 0.3–1.2)
Total Protein: 6.8 g/dL (ref 6.5–8.1)

## 2021-05-23 LAB — CBC WITH DIFFERENTIAL (CANCER CENTER ONLY)
Abs Immature Granulocytes: 0.05 10*3/uL (ref 0.00–0.07)
Basophils Absolute: 0 10*3/uL (ref 0.0–0.1)
Basophils Relative: 0 %
Eosinophils Absolute: 0.6 10*3/uL — ABNORMAL HIGH (ref 0.0–0.5)
Eosinophils Relative: 7 %
HCT: 33.9 % — ABNORMAL LOW (ref 36.0–46.0)
Hemoglobin: 10.3 g/dL — ABNORMAL LOW (ref 12.0–15.0)
Immature Granulocytes: 1 %
Lymphocytes Relative: 26 %
Lymphs Abs: 2 10*3/uL (ref 0.7–4.0)
MCH: 22.4 pg — ABNORMAL LOW (ref 26.0–34.0)
MCHC: 30.4 g/dL (ref 30.0–36.0)
MCV: 73.9 fL — ABNORMAL LOW (ref 80.0–100.0)
Monocytes Absolute: 0.1 10*3/uL (ref 0.1–1.0)
Monocytes Relative: 2 %
Neutro Abs: 5 10*3/uL (ref 1.7–7.7)
Neutrophils Relative %: 64 %
Platelet Count: 225 10*3/uL (ref 150–400)
RBC: 4.59 MIL/uL (ref 3.87–5.11)
RDW: 22 % — ABNORMAL HIGH (ref 11.5–15.5)
WBC Count: 7.7 10*3/uL (ref 4.0–10.5)
nRBC: 0 % (ref 0.0–0.2)

## 2021-05-23 LAB — TOTAL PROTEIN, URINE DIPSTICK: Protein, ur: NEGATIVE mg/dL

## 2021-05-23 MED ORDER — ATROPINE SULFATE 1 MG/ML IV SOLN
0.5000 mg | Freq: Once | INTRAVENOUS | Status: AC
  Administered 2021-05-23: 12:00:00 0.5 mg via INTRAVENOUS
  Filled 2021-05-23: qty 1

## 2021-05-23 MED ORDER — SODIUM CHLORIDE 0.9 % IV SOLN: INTRAVENOUS | Status: DC

## 2021-05-23 MED ORDER — ONDANSETRON HCL 8 MG PO TABS
8.0000 mg | ORAL_TABLET | Freq: Once | ORAL | Status: AC
  Administered 2021-05-23: 13:00:00 8 mg via ORAL
  Filled 2021-05-23: qty 1

## 2021-05-23 MED ORDER — HEPARIN SOD (PORK) LOCK FLUSH 100 UNIT/ML IV SOLN
500.0000 [IU] | Freq: Once | INTRAVENOUS | Status: AC
  Administered 2021-05-23: 15:00:00 500 [IU] via INTRAVENOUS

## 2021-05-23 MED ORDER — SODIUM CHLORIDE 0.9% FLUSH
10.0000 mL | INTRAVENOUS | Status: DC | PRN
  Administered 2021-05-23: 15:00:00 10 mL via INTRAVENOUS

## 2021-05-23 NOTE — Progress Notes (Signed)
Patient called stating she called emergncy line over the weekend and had some blood in urine and diarrhea, states she was trying to stay hydrated but feels weak. Called patient back, she states her urine is dark and has some bleeding when wiping, states she thought it was her period coming at first but doesn't feel like its a period now. States she is also having diarrhea off and on, lower back pain and stomach cramping, Discussed with Lottie Dawson, orders for patient to come in for IVF, atropine and lab work today. Orders [placed and patient notified.

## 2021-05-23 NOTE — Patient Instructions (Signed)

## 2021-05-24 ENCOUNTER — Other Ambulatory Visit: Payer: Self-pay | Admitting: Family

## 2021-05-24 ENCOUNTER — Other Ambulatory Visit (HOSPITAL_BASED_OUTPATIENT_CLINIC_OR_DEPARTMENT_OTHER): Payer: Self-pay

## 2021-05-24 ENCOUNTER — Telehealth: Payer: Self-pay | Admitting: Family

## 2021-05-24 DIAGNOSIS — N3000 Acute cystitis without hematuria: Secondary | ICD-10-CM

## 2021-05-24 LAB — URINE CULTURE: Culture: 10000 — AB

## 2021-05-24 MED ORDER — AMOXICILLIN-POT CLAVULANATE 875-125 MG PO TABS
1.0000 | ORAL_TABLET | Freq: Two times a day (BID) | ORAL | 0 refills | Status: DC
Start: 2021-05-24 — End: 2021-06-19
  Filled 2021-05-24: qty 10, 5d supply, fill #0

## 2021-05-24 NOTE — Telephone Encounter (Signed)
Message left with call back number to go over recent lab work which includes positive UTI and new script for Augmentin.

## 2021-05-25 ENCOUNTER — Telehealth: Payer: Self-pay | Admitting: *Deleted

## 2021-05-25 NOTE — Telephone Encounter (Signed)
-----   Message from Celso Amy, NP sent at 05/24/2021  4:11 PM EST ----- She does have a UTI :( I sent in a prescription for Augmentin PO BID for 5 days. I tried to call but no answer. Thanks!   ----- Message ----- From: Buel Ream, Lab In [place] Sent: 05/23/2021   2:28 PM EST To: Celso Amy, NP

## 2021-05-25 NOTE — Telephone Encounter (Signed)
Unable to reach pt. LMOVM for pt with instructions to pick up Rx at pharmacy. Pt to call with any concerns.

## 2021-05-30 ENCOUNTER — Ambulatory Visit (HOSPITAL_BASED_OUTPATIENT_CLINIC_OR_DEPARTMENT_OTHER)
Admission: RE | Admit: 2021-05-30 | Discharge: 2021-05-30 | Disposition: A | Discharge status: Home / Self Care | Payer: 59 | Source: Ambulatory Visit | Attending: Hematology & Oncology | Admitting: Hematology & Oncology

## 2021-05-30 ENCOUNTER — Other Ambulatory Visit: Payer: Self-pay

## 2021-05-30 ENCOUNTER — Encounter (HOSPITAL_BASED_OUTPATIENT_CLINIC_OR_DEPARTMENT_OTHER): Payer: Self-pay

## 2021-05-30 DIAGNOSIS — Z1231 Encounter for screening mammogram for malignant neoplasm of breast: Secondary | ICD-10-CM | POA: Diagnosis not present

## 2021-05-30 DIAGNOSIS — C189 Malignant neoplasm of colon, unspecified: Secondary | ICD-10-CM | POA: Diagnosis present

## 2021-05-30 DIAGNOSIS — C787 Secondary malignant neoplasm of liver and intrahepatic bile duct: Secondary | ICD-10-CM | POA: Insufficient documentation

## 2021-05-30 IMAGING — MG MM DIGITAL SCREENING BILAT W/ TOMO AND CAD
8 series · 8 of 24 positions shown · non-contrast
Comparison: None.

CLINICAL DATA: Screening.

EXAM:
DIGITAL SCREENING BILATERAL MAMMOGRAM WITH TOMOSYNTHESIS AND CAD
TECHNIQUE: Bilateral screening digital craniocaudal and mediolateral oblique
mammograms were obtained. Bilateral screening digital breast
tomosynthesis was performed. The images were evaluated with
computer-aided detection.

[R CC synth-2D]
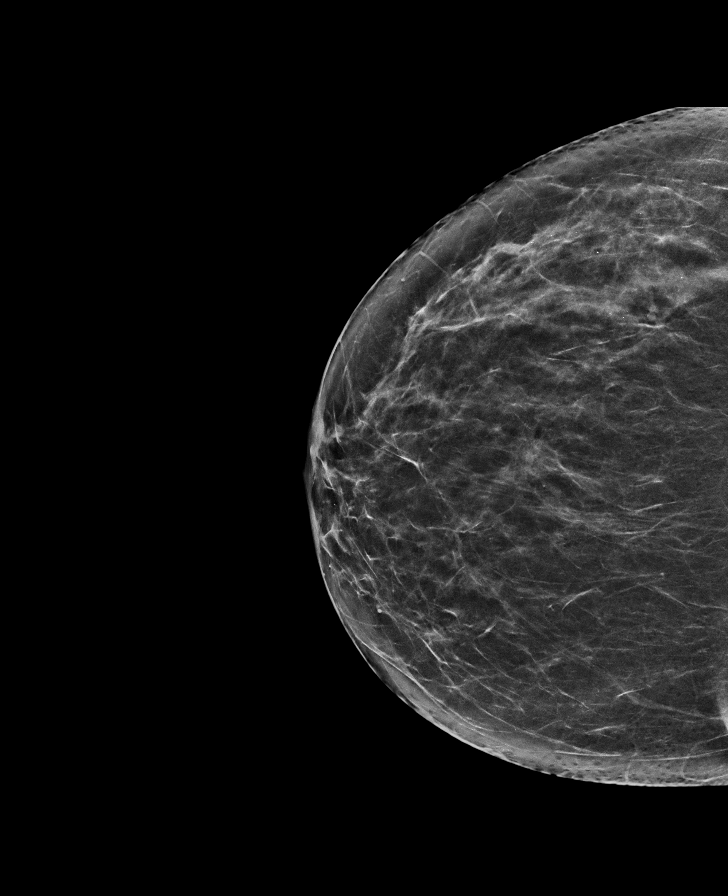

[L MLO synth-2D]
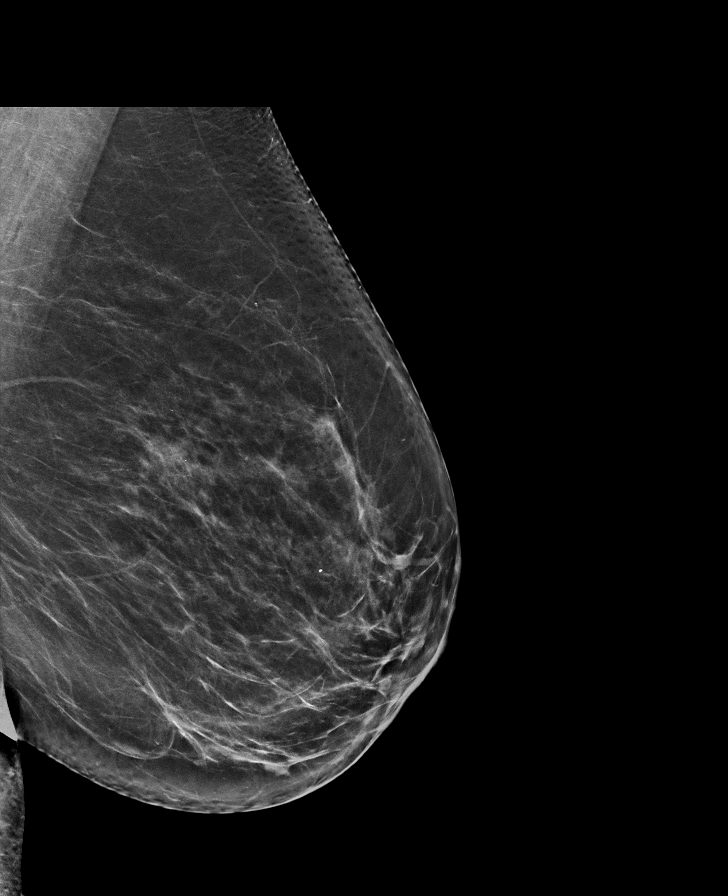

[R MLO synth-2D]
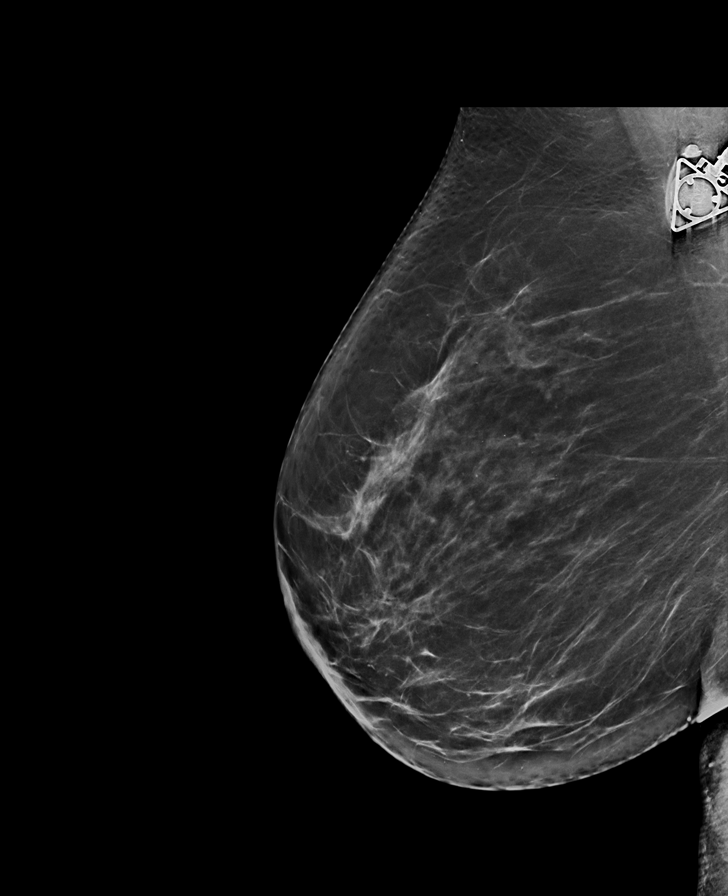

[L CC synth-2D]
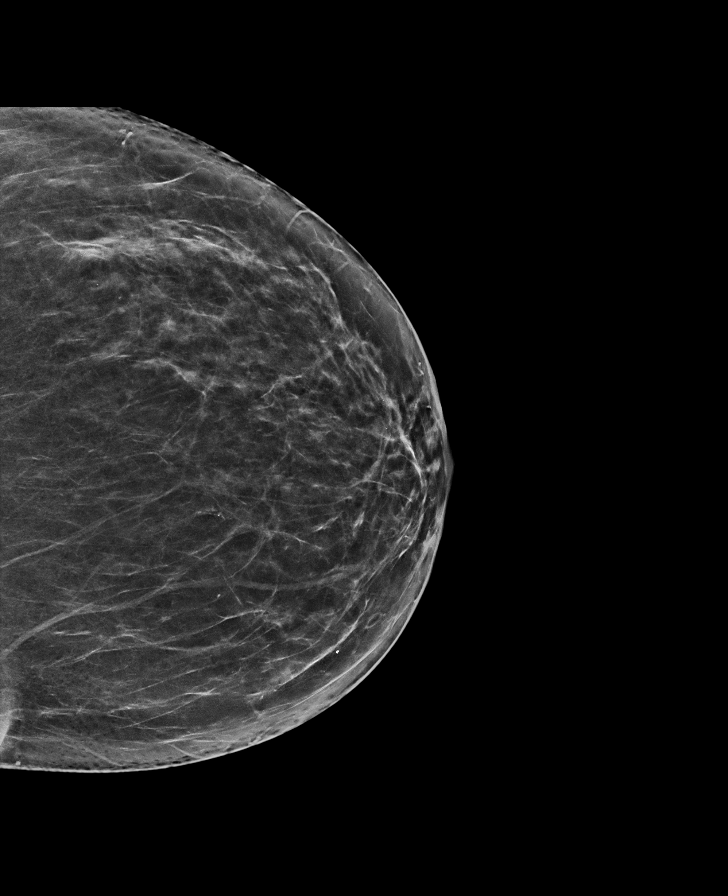

[R MLO tomo · tomo slice 41/81.0]
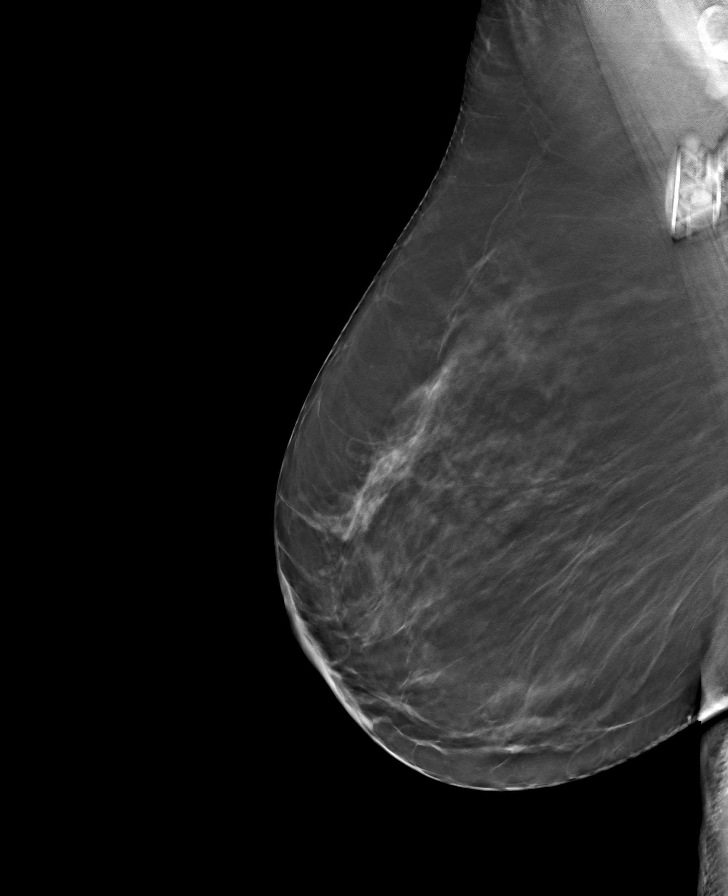

[L CC tomo · tomo slice 35/68.0]
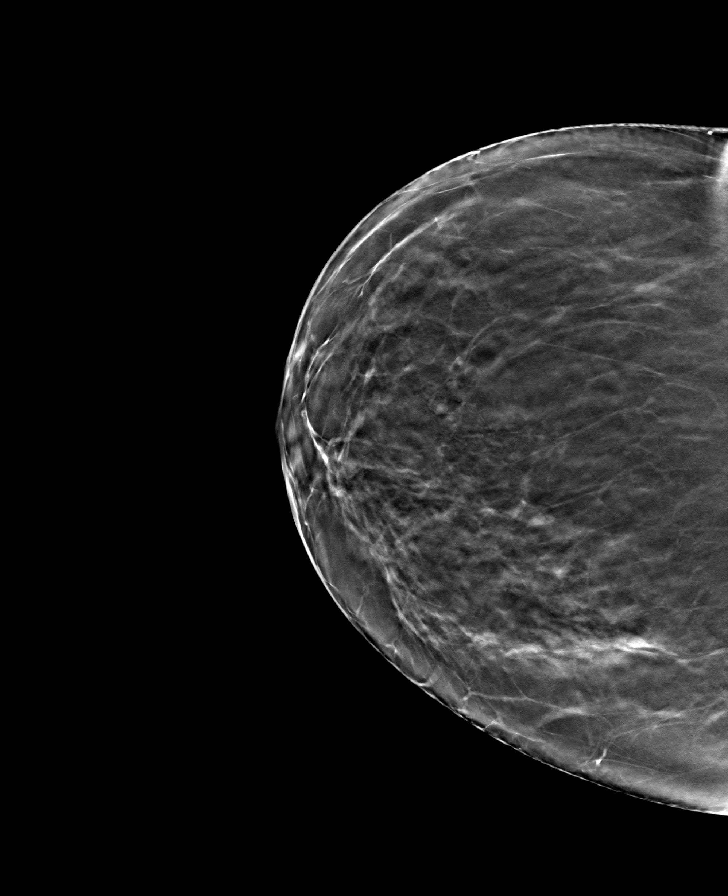

[L MLO tomo · tomo slice 39/77.0]
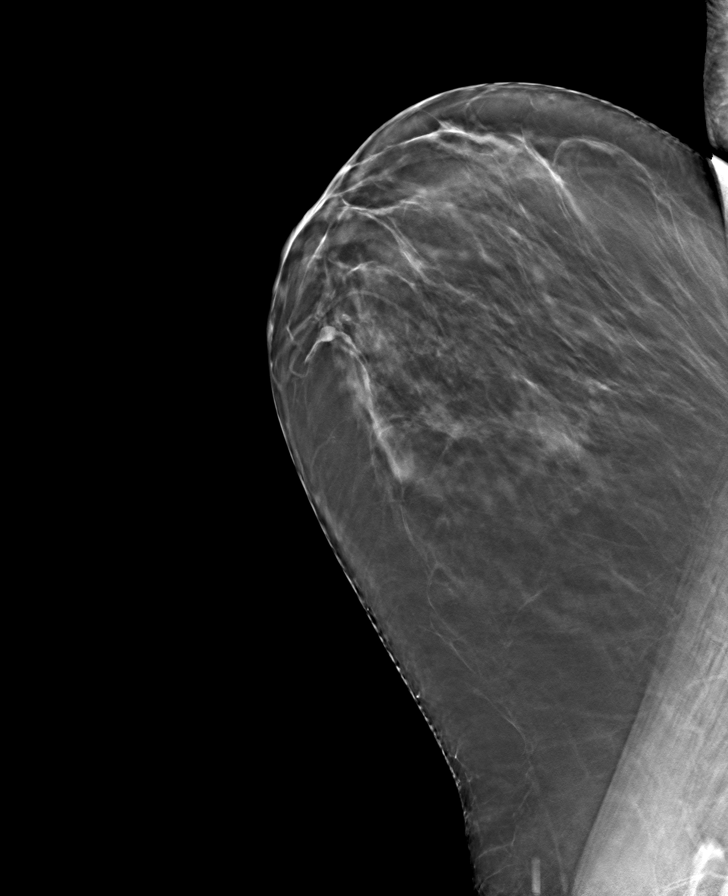

[R CC tomo · tomo slice 34/67.0]
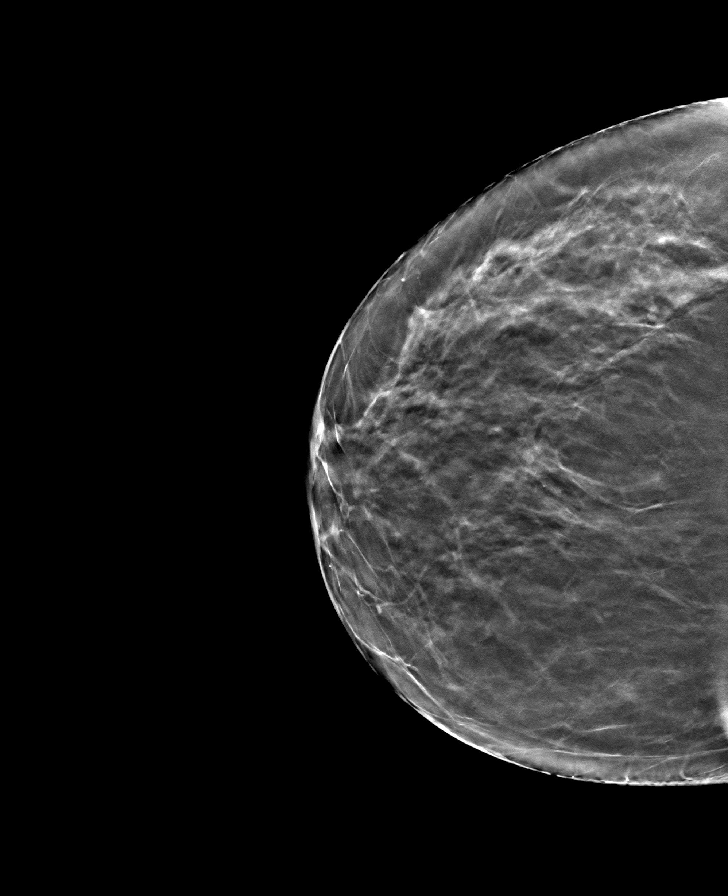

[8 of 24 positions shown; findings below may reference images not displayed]

ACR Breast Density Category b: There are scattered areas of
fibroglandular density.
FINDINGS: There are no findings suspicious for malignancy.
IMPRESSION: No mammographic evidence of malignancy. A result letter of this
screening mammogram will be mailed directly to the patient.

RECOMMENDATION:
Screening mammogram in one year. (Code:[4Q])

BI-RADS CATEGORY  1: Negative.

## 2021-05-31 ENCOUNTER — Encounter: Payer: Self-pay | Admitting: *Deleted

## 2021-05-31 ENCOUNTER — Inpatient Hospital Stay (HOSPITAL_BASED_OUTPATIENT_CLINIC_OR_DEPARTMENT_OTHER): Payer: 59 | Admitting: Hematology & Oncology

## 2021-05-31 ENCOUNTER — Inpatient Hospital Stay: Payer: 59

## 2021-05-31 ENCOUNTER — Encounter: Payer: Self-pay | Admitting: Hematology & Oncology

## 2021-05-31 ENCOUNTER — Inpatient Hospital Stay: Payer: 59 | Attending: Hematology & Oncology

## 2021-05-31 VITALS — BP 109/56 | HR 62 | Temp 98.8°F | Resp 19 | Wt 187.0 lb

## 2021-05-31 DIAGNOSIS — D509 Iron deficiency anemia, unspecified: Secondary | ICD-10-CM | POA: Insufficient documentation

## 2021-05-31 DIAGNOSIS — E559 Vitamin D deficiency, unspecified: Secondary | ICD-10-CM

## 2021-05-31 DIAGNOSIS — C189 Malignant neoplasm of colon, unspecified: Secondary | ICD-10-CM | POA: Diagnosis not present

## 2021-05-31 DIAGNOSIS — C182 Malignant neoplasm of ascending colon: Secondary | ICD-10-CM | POA: Diagnosis present

## 2021-05-31 DIAGNOSIS — Z452 Encounter for adjustment and management of vascular access device: Secondary | ICD-10-CM | POA: Diagnosis not present

## 2021-05-31 DIAGNOSIS — U099 Post covid-19 condition, unspecified: Secondary | ICD-10-CM

## 2021-05-31 DIAGNOSIS — Z7189 Other specified counseling: Secondary | ICD-10-CM

## 2021-05-31 DIAGNOSIS — R053 Chronic cough: Secondary | ICD-10-CM

## 2021-05-31 DIAGNOSIS — C787 Secondary malignant neoplasm of liver and intrahepatic bile duct: Secondary | ICD-10-CM

## 2021-05-31 DIAGNOSIS — G4700 Insomnia, unspecified: Secondary | ICD-10-CM

## 2021-05-31 DIAGNOSIS — Z86018 Personal history of other benign neoplasm: Secondary | ICD-10-CM

## 2021-05-31 DIAGNOSIS — Z5112 Encounter for antineoplastic immunotherapy: Secondary | ICD-10-CM | POA: Insufficient documentation

## 2021-05-31 DIAGNOSIS — Z5111 Encounter for antineoplastic chemotherapy: Secondary | ICD-10-CM | POA: Insufficient documentation

## 2021-05-31 DIAGNOSIS — N951 Menopausal and female climacteric states: Secondary | ICD-10-CM

## 2021-05-31 DIAGNOSIS — N950 Postmenopausal bleeding: Secondary | ICD-10-CM

## 2021-05-31 DIAGNOSIS — K769 Liver disease, unspecified: Secondary | ICD-10-CM

## 2021-05-31 DIAGNOSIS — R0602 Shortness of breath: Secondary | ICD-10-CM

## 2021-05-31 DIAGNOSIS — K449 Diaphragmatic hernia without obstruction or gangrene: Secondary | ICD-10-CM

## 2021-05-31 DIAGNOSIS — R3129 Other microscopic hematuria: Secondary | ICD-10-CM

## 2021-05-31 DIAGNOSIS — R1031 Right lower quadrant pain: Secondary | ICD-10-CM

## 2021-05-31 DIAGNOSIS — D649 Anemia, unspecified: Secondary | ICD-10-CM

## 2021-05-31 LAB — CMP (CANCER CENTER ONLY)
ALT: 10 U/L (ref 0–44)
AST: 12 U/L — ABNORMAL LOW (ref 15–41)
Albumin: 3.6 g/dL (ref 3.5–5.0)
Alkaline Phosphatase: 116 U/L (ref 38–126)
Anion gap: 5 (ref 5–15)
BUN: <5 mg/dL — ABNORMAL LOW (ref 6–20)
CO2: 29 mmol/L (ref 22–32)
Calcium: 8.8 mg/dL — ABNORMAL LOW (ref 8.9–10.3)
Chloride: 106 mmol/L (ref 98–111)
Creatinine: 0.56 mg/dL (ref 0.44–1.00)
GFR, Estimated: >60 mL/min (ref >60)
Glucose, Bld: 124 mg/dL — ABNORMAL HIGH (ref 70–99)
Potassium: 3.4 mmol/L — ABNORMAL LOW (ref 3.5–5.1)
Sodium: 140 mmol/L (ref 135–145)
Total Bilirubin: 0.2 mg/dL — ABNORMAL LOW (ref 0.3–1.2)
Total Protein: 6.2 g/dL — ABNORMAL LOW (ref 6.5–8.1)

## 2021-05-31 LAB — CBC WITH DIFFERENTIAL (CANCER CENTER ONLY)
Abs Immature Granulocytes: 0 10*3/uL (ref 0.00–0.07)
Basophils Absolute: 0 10*3/uL (ref 0.0–0.1)
Basophils Relative: 1 %
Eosinophils Absolute: 0.2 10*3/uL (ref 0.0–0.5)
Eosinophils Relative: 10 %
HCT: 30.7 % — ABNORMAL LOW (ref 36.0–46.0)
Hemoglobin: 9.1 g/dL — ABNORMAL LOW (ref 12.0–15.0)
Immature Granulocytes: 0 %
Lymphocytes Relative: 54 %
Lymphs Abs: 0.9 10*3/uL (ref 0.7–4.0)
MCH: 22.2 pg — ABNORMAL LOW (ref 26.0–34.0)
MCHC: 29.6 g/dL — ABNORMAL LOW (ref 30.0–36.0)
MCV: 75.1 fL — ABNORMAL LOW (ref 80.0–100.0)
Monocytes Absolute: 0.3 10*3/uL (ref 0.1–1.0)
Monocytes Relative: 16 %
Neutro Abs: 0.3 10*3/uL — CL (ref 1.7–7.7)
Neutrophils Relative %: 19 %
Platelet Count: 269 10*3/uL (ref 150–400)
RBC: 4.09 MIL/uL (ref 3.87–5.11)
RDW: 20.8 % — ABNORMAL HIGH (ref 11.5–15.5)
WBC Count: 1.7 10*3/uL — ABNORMAL LOW (ref 4.0–10.5)
nRBC: 0 % (ref 0.0–0.2)

## 2021-05-31 MED ORDER — HEPARIN SOD (PORK) LOCK FLUSH 100 UNIT/ML IV SOLN: 500.0000 [IU] | Freq: Once | INTRAVENOUS | Status: DC | PRN

## 2021-05-31 MED ORDER — DEXTROSE 5 % IV SOLN: Freq: Once | INTRAVENOUS | Status: AC

## 2021-05-31 MED ORDER — ATROPINE SULFATE 1 MG/ML IV SOLN
0.5000 mg | Freq: Once | INTRAVENOUS | Status: AC | PRN
  Administered 2021-05-31: 0.5 mg via INTRAVENOUS
  Filled 2021-05-31: qty 1

## 2021-05-31 MED ORDER — SODIUM CHLORIDE 0.9 % IV SOLN
5.0000 mg/kg | Freq: Once | INTRAVENOUS | Status: AC
  Administered 2021-05-31: 400 mg via INTRAVENOUS
  Filled 2021-05-31: qty 16

## 2021-05-31 MED ORDER — LORAZEPAM 2 MG/ML IJ SOLN: 1.0000 mg | Freq: Once | INTRAMUSCULAR | Status: DC

## 2021-05-31 MED ORDER — SODIUM CHLORIDE 0.9 % IV SOLN
10.0000 mg | Freq: Once | INTRAVENOUS | Status: AC
  Administered 2021-05-31: 10 mg via INTRAVENOUS
  Filled 2021-05-31: qty 10

## 2021-05-31 MED ORDER — ENCORAFENIB 75 MG PO CAPS
300.0000 mg | ORAL_CAPSULE | Freq: Every day | ORAL | 6 refills | Status: DC
  Filled 2021-05-31: qty 120, 30d supply, fill #0

## 2021-05-31 MED ORDER — SODIUM CHLORIDE 0.9 % IV SOLN: Freq: Once | INTRAVENOUS | Status: AC

## 2021-05-31 MED ORDER — SODIUM CHLORIDE 0.9% FLUSH: 10.0000 mL | INTRAVENOUS | Status: DC | PRN

## 2021-05-31 MED ORDER — SODIUM CHLORIDE 0.9 % IV SOLN
1920.0000 mg/m2 | INTRAVENOUS | Status: DC
  Administered 2021-05-31: 3700 mg via INTRAVENOUS
  Filled 2021-05-31: qty 74

## 2021-05-31 MED ORDER — ALTEPLASE 2 MG IJ SOLR
2.0000 mg | Freq: Once | INTRAMUSCULAR | Status: AC | PRN
  Administered 2021-05-31: 2 mg

## 2021-05-31 MED ORDER — SODIUM CHLORIDE 0.9 % IV SOLN
150.0000 mg | Freq: Once | INTRAVENOUS | Status: AC
  Administered 2021-05-31: 150 mg via INTRAVENOUS
  Filled 2021-05-31: qty 150

## 2021-05-31 MED ORDER — LEUCOVORIN CALCIUM INJECTION 350 MG
400.0000 mg/m2 | Freq: Once | INTRAVENOUS | Status: AC
  Administered 2021-05-31: 772 mg via INTRAVENOUS
  Filled 2021-05-31: qty 38.6

## 2021-05-31 MED ORDER — PALONOSETRON HCL INJECTION 0.25 MG/5ML
0.2500 mg | Freq: Once | INTRAVENOUS | Status: AC
  Administered 2021-05-31: 0.25 mg via INTRAVENOUS
  Filled 2021-05-31: qty 5

## 2021-05-31 MED ORDER — SODIUM CHLORIDE 0.9 % IV SOLN
132.0000 mg/m2 | Freq: Once | INTRAVENOUS | Status: AC
  Administered 2021-05-31: 260 mg via INTRAVENOUS
  Filled 2021-05-31: qty 5

## 2021-05-31 MED ORDER — OXALIPLATIN CHEMO INJECTION 100 MG/20ML
68.0000 mg/m2 | Freq: Once | INTRAVENOUS | Status: AC
  Administered 2021-05-31: 130 mg via INTRAVENOUS
  Filled 2021-05-31: qty 20

## 2021-05-31 NOTE — Progress Notes (Signed)
Blood return from port at 1035, labs drawn.

## 2021-05-31 NOTE — Progress Notes (Signed)
Hematology and Oncology Follow Up Visit  Rebecca Bullock [id number] [birth year] 56 y.o. 05/31/2021   Principle Diagnosis:  Metastatic colorectal cancer-liver metastasis- BRAF (+)  Current Therapy:   Status post cycle #1 of chemotherapy with FOLFOXIRI/Avastin Encorafenib/Vectibix -- start cycle #1 on 06/19/2021     Interim History:  Rebecca Bullock is back for follow-up.  This is her third office visit.  Thankfully, we did get the molecular markers back.  She does have the BRAF mutation..  She really had a tough time with her first cycle of chemotherapy.  We gave her full dose chemotherapy with FOLFOXIRI.  As such, we probably will give her a second cycle with lower dose.  Now that we know that she has the BRAF mutation, we will get her on targeted therapy with encorafinib and Vectibix.  I think this would be a very good combination for her.  She is feeling better.  She is eating a bit better.  She had a tough Christmas and really was not feeling all that well.  There is no pain.  She is having no problems with bowels or bladder.  There is no bleeding.  She has had no rashes.  There is been no leg swelling.  She has had no cough or shortness of breath.  She has had no headache.  Overall, I would say performance status is probably ECOG 1.    Medications:  Current Outpatient Medications:    acetaminophen (TYLENOL) 325 MG tablet, Take 325 mg by mouth every 6 (six) hours as needed for moderate pain or headache., Disp: , Rfl:    albuterol (VENTOLIN HFA) 108 (90 Base) MCG/ACT inhaler, Inhale 2 puffs into the lungs every 6 (six) hours as needed. (Patient taking differently: Inhale 2 puffs into the lungs every 6 (six) hours as needed for shortness of breath or wheezing.), Disp: 18 g, Rfl: 0   amoxicillin-clavulanate (AUGMENTIN) 875-125 MG tablet, Take 1 tablet by mouth 2 (two) times daily., Disp: 10 tablet, Rfl: 0   betamethasone valerate (VALISONE) 0.1 % cream, Apply 1 application topically See admin  instructions. Apply 1 application topically every other day as needed for psoriasis flare, Disp: , Rfl:    dexamethasone (DECADRON) 4 MG tablet, Take 2 tablets (8 mg total) by mouth daily. Start the day after chemotherapy for 3 days. Take with food., Disp: 8 tablet, Rfl: 5   estradiol (ESTRACE) 0.1 MG/GM vaginal cream, Place 1 Applicatorful vaginally daily as needed (dryness / irritation)., Disp: , Rfl:    fluticasone (FLONASE) 50 MCG/ACT nasal spray, Place 2 sprays into both nostrils daily. (Patient not taking: Reported on 05/09/2021), Disp: 16 g, Rfl: 6   folic acid (FOLVITE) 1 MG tablet, Take 1 tablet (1 mg total) by mouth daily. (Patient not taking: Reported on 05/09/2021), Disp: 100 tablet, Rfl:    folic acid (FOLVITE) 250 MCG tablet, Take 800 mcg by mouth daily., Disp: , Rfl:    iron polysaccharides (NIFEREX) 150 MG capsule, Take 1 capsule (150 mg total) by mouth daily. (Patient not taking: Reported on 05/09/2021), Disp: 100 capsule, Rfl: 2   lidocaine-prilocaine (EMLA) cream, Apply to affected area once, Disp: 30 g, Rfl: 3   loperamide (IMODIUM) 2 MG capsule, Take 2 at onset of diarrhea, then 1 every 2hrs until 12hr without a BM. May take 2 tab every 4hrs at bedtime. If diarrhea recurs repeat., Disp: 60 capsule, Rfl: 2   montelukast (SINGULAIR) 10 MG tablet, Take 1 tablet (10 mg total) by mouth at bedtime., Disp:  90 tablet, Rfl: 3   Multiple Vitamins-Minerals (MULTIVITAMIN WITH MINERALS) tablet, Take 1 tablet by mouth daily. (Patient not taking: Reported on 05/09/2021), Disp: 100 tablet, Rfl: 0   omeprazole (PRILOSEC OTC) 20 MG tablet, Take 20 mg by mouth daily as needed (acid reflux)., Disp: , Rfl:    ondansetron (ZOFRAN) 8 MG tablet, Take 1 tablet (8 mg total) by mouth 2 (two) times daily as needed. Start on day 3 after chemotherapy., Disp: 30 tablet, Rfl: 1   pantoprazole (PROTONIX) 40 MG tablet, Take 1 tablet (40 mg total) by mouth daily. (Patient not taking: Reported on 05/09/2021), Disp: 30  tablet, Rfl: 3   prochlorperazine (COMPAZINE) 10 MG tablet, Take 1 tablet (10 mg total) by mouth every 6 (six) hours as needed (Nausea or vomiting)., Disp: 30 tablet, Rfl: 1   pseudoephedrine (SUDAFED) 30 MG tablet, Take 30 mg by mouth daily as needed for congestion., Disp: , Rfl:    vitamin B-12 (CYANOCOBALAMIN) 1000 MCG tablet, Take 1 tablet (1,000 mcg total) by mouth daily., Disp: 100 tablet, Rfl: 0 No current facility-administered medications for this visit.  Facility-Administered Medications Ordered in Other Visits:    atropine injection 0.5 mg, 0.5 mg, Intravenous, Once PRN, [name], [name], MD   fluorouracil (ADRUCIL) 3,700 mg in sodium chloride 0.9 % 76 mL chemo infusion, 1,920 mg/m2 (Treatment Plan Recorded), Intravenous, 1 day or 1 dose, Bryella Diviney, [name], MD   heparin lock flush 100 unit/mL, 500 Units, Intracatheter, Once PRN, [name], MD   leucovorin 772 mg in dextrose 5 % 250 mL infusion, 400 mg/m2 (Treatment Plan Recorded), Intravenous, Once, [name], MD, Last Rate: 144 mL/hr at 05/31/21 1440, 772 mg at 05/31/21 1440   LORazepam (ATIVAN) injection 1 mg, 1 mg, Intravenous, Once, Dixie Jafri, [name], MD   oxaliplatin (ELOXATIN) 130 mg in dextrose 5 % 500 mL chemo infusion, 68 mg/m2 (Treatment Plan Recorded), Intravenous, Once, [name], MD, Last Rate: 263 mL/hr at 05/31/21 1438, 130 mg at 05/31/21 1438   sodium chloride flush (NS) 0.9 % injection 10 mL, 10 mL, Intracatheter, PRN, [name], MD  Allergies:  Allergies  Allergen Reactions   Irinotecan Other (See Comments)    Flushing, tingling, visual disturbance   Metronidazole Diarrhea    GI Upset (intolerance)    Past Medical History, Surgical history, Social history, and Family History were reviewed and updated.  Review of Systems: Review of Systems  Constitutional: Negative.   HENT:  Negative.    Eyes: Negative.   Respiratory: Negative.    Cardiovascular: Negative.   Gastrointestinal:  Negative.   Endocrine: Negative.   Genitourinary: Negative.    Skin: Negative.   Neurological: Negative.   Hematological: Negative.   Psychiatric/Behavioral: Negative.     Physical Exam:  weight is 187 lb (84.8 kg). Her oral temperature is 98.8 F (37.1 C). Her blood pressure is 109/56 (abnormal) and her pulse is 62. Her respiration is 19 and oxygen saturation is 100%.   Wt Readings from Last 3 Encounters:  05/31/21 187 lb (84.8 kg)  05/11/21 196 lb (88.9 kg)  05/05/21 190 lb (86.2 kg)    Physical Exam Vitals reviewed.  HENT:     Head: Normocephalic and atraumatic.  Eyes:     Pupils: Pupils are equal, round, and reactive to light.  Cardiovascular:     Rate and Rhythm: Normal rate and regular rhythm.     Heart sounds: Normal heart sounds.  Pulmonary:     Effort:  Pulmonary effort is normal.     Breath sounds: Normal breath sounds.  Abdominal:     General: Bowel sounds are normal.     Palpations: Abdomen is soft.  Musculoskeletal:        General: No tenderness or deformity. Normal range of motion.     Cervical back: Normal range of motion.  Lymphadenopathy:     Cervical: No cervical adenopathy.  Skin:    General: Skin is warm and dry.     Findings: No erythema or rash.  Neurological:     Mental Status: She is alert and oriented to person, place, and time.  Psychiatric:        Behavior: Behavior normal.        Thought Content: Thought content normal.        Judgment: Judgment normal.     Lab Results  Component Value Date   WBC 1.7 (L) 05/31/2021   HGB 9.1 (L) 05/31/2021   HCT 30.7 (L) 05/31/2021   MCV 75.1 (L) 05/31/2021   PLT 269 05/31/2021     Chemistry      Component Value Date/Time   NA 140 05/31/2021 1037   K 3.4 (L) 05/31/2021 1037   CL 106 05/31/2021 1037   CO2 29 05/31/2021 1037   BUN <5 (L) 05/31/2021 1037   CREATININE 0.56 05/31/2021 1037   CREATININE 0.56 03/23/2021 0000      Component Value Date/Time   CALCIUM 8.8 (L) 05/31/2021 1037    ALKPHOS 116 05/31/2021 1037   AST 12 (L) 05/31/2021 1037   ALT 10 05/31/2021 1037   BILITOT 0.2 (L) 05/31/2021 1037     Impression and Plan: Ms. Schabel is a very charming 56 year old white female.  She has metastatic colon cancer.  She has disease in her liver.  Thankfully, there is no disease outside of the liver outside of the primary site.  I still think that we might be able to surgically get her into remission.  Again, this is going to take a look very good response to treatment.  Now that we know that she does have the BRAF mutation, we will switch her over to targeted therapy.  I think that using encorafinib along with Vectibix would be a very good combination for her.  I talked to her about this.  She is happy that she does not have to take any more chemotherapy after today.  I think we should go ahead and treat her today as is probably can take several weeks before we get approval for the new protocol.  I did go over side effects of the encorafinib and the Vectibix.  We will go ahead watch out for skin rashes.  To watch out for temperatures.  She may have some abdominal pain.  Her blood counts certainly could go down but nothing to the degree that we would see with chemotherapy.  Again I have do think that we should have a very good chance of getting a good response.  Because she is so young, I would still think that we might consider surgical resection of residual disease after treatment.  I probably would not repeat a scan until March.  I think we really have to give her a good amount of treatment to see how she would respond.  Her CEA levels not all that high so much sure we can use this as a marker for response.  I am just happy that she has a BRAF mutation.  Patients typically do well  when to have this mutation.  We will go ahead with her treatment today with chemotherapy and Avastin.  We will plan to get her back to see [place] in about 3 weeks or so.  Hopefully, by then, we  will be able to have her on the encorafinib and start Vectibix.   [name], MD 1/4/20234:02 PM

## 2021-05-31 NOTE — Progress Notes (Signed)
OK to treat today with WBC-1.6 and ANC-0.31 per order of Dr. [name].

## 2021-05-31 NOTE — Patient Instructions (Signed)
[facility]  Discharge Instructions: Thank you for choosing [facility] to provide your oncology and hematology care.   If you have a lab appointment with the [facility], please go directly to the [facility] and check in at the registration area.  Wear comfortable clothing and clothing appropriate for easy access to any Portacath or PICC line.   We strive to give you quality time with your provider. You may need to reschedule your appointment if you arrive late (15 or more minutes).  Arriving late affects you and other patients whose appointments are after yours.  Also, if you miss three or more appointments without notifying the office, you may be dismissed from the clinic at the providers discretion.      For prescription refill requests, have your pharmacy contact our office and allow 72 hours for refills to be completed.    Today you received the following chemotherapy and/or immunotherapy agents:  Irinotecan, Leucovorin, Oxaliplatin and 5-FU.       To help prevent nausea and vomiting after your treatment, we encourage you to take your nausea medication as directed.  BELOW ARE SYMPTOMS THAT SHOULD BE REPORTED IMMEDIATELY: *FEVER GREATER THAN 100.4 F (38 C) OR HIGHER *CHILLS OR SWEATING *NAUSEA AND VOMITING THAT IS NOT CONTROLLED WITH YOUR NAUSEA MEDICATION *UNUSUAL SHORTNESS OF BREATH *UNUSUAL BRUISING OR BLEEDING *URINARY PROBLEMS (pain or burning when urinating, or frequent urination) *BOWEL PROBLEMS (unusual diarrhea, constipation, pain near the anus) TENDERNESS IN MOUTH AND THROAT WITH OR WITHOUT PRESENCE OF ULCERS (sore throat, sores in mouth, or a toothache) UNUSUAL RASH, SWELLING OR PAIN  UNUSUAL VAGINAL DISCHARGE OR ITCHING   Items with * indicate a potential emergency and should be followed up as soon as possible or go to the Emergency Department if any problems should occur.  Please show the CHEMOTHERAPY ALERT CARD or  IMMUNOTHERAPY ALERT CARD at check-in to the Emergency Department and triage nurse. Should you have questions after your visit or need to cancel or reschedule your appointment, please contact [facility]  [phone] and follow the prompts.  Office hours are 8:00 a.m. to 4:30 p.m. Monday - Friday. Please note that voicemails left after 4:00 p.m. may not be returned until the following business day.  We are closed weekends and major holidays. You have access to a nurse at all times for urgent questions. Please call the main number to the clinic [phone] and follow the prompts.  For any non-urgent questions, you may also contact your provider using MyChart. We now offer e-Visits for anyone 63 and older to request care online for non-urgent symptoms. For details visit [email or web address].   Also download the MyChart app! Go to the app store, search "MyChart", open the app, select [facility], and log in with your MyChart username and password.  Due to Covid, a mask is required upon entering the hospital/clinic. If you do not have a mask, one will be given to you upon arrival. For doctor visits, patients may have 1 support person aged 83 or older with them. For treatment visits, patients cannot have anyone with them due to current Covid guidelines and our immunocompromised population.

## 2021-06-01 ENCOUNTER — Other Ambulatory Visit (HOSPITAL_COMMUNITY): Payer: Self-pay

## 2021-06-01 ENCOUNTER — Telehealth: Payer: Self-pay | Admitting: Pharmacist

## 2021-06-01 ENCOUNTER — Telehealth: Payer: Self-pay | Admitting: Pharmacy Technician

## 2021-06-01 DIAGNOSIS — C189 Malignant neoplasm of colon, unspecified: Secondary | ICD-10-CM

## 2021-06-01 DIAGNOSIS — C787 Secondary malignant neoplasm of liver and intrahepatic bile duct: Secondary | ICD-10-CM

## 2021-06-01 LAB — IRON AND IRON BINDING CAPACITY (CC-WL,HP ONLY)
Iron: 16 ug/dL — ABNORMAL LOW (ref 28–170)
Saturation Ratios: 5 % — ABNORMAL LOW (ref 10.4–31.8)
TIBC: 336 ug/dL (ref 250–450)
UIBC: 320 ug/dL (ref 148–442)

## 2021-06-01 LAB — CEA (IN HOUSE-CHCC): CEA (CHCC-In House): 3.6 ng/mL (ref 0.00–5.00)

## 2021-06-01 LAB — FERRITIN: Ferritin: 46 ng/mL (ref 11–307)

## 2021-06-01 MED ORDER — ENCORAFENIB 75 MG PO CAPS: 300.0000 mg | ORAL_CAPSULE | Freq: Every day | ORAL | 6 refills | Status: DC

## 2021-06-01 NOTE — Telephone Encounter (Signed)
Oral Oncology Pharmacist Encounter  Received new prescription for Braftovi (encorafenib) for the treatment of metastatic colon cancer, BRAF V600E positive in conjunction with Vectibix, planned duration until disease progression or unacceptable drug toxicity.  CBC w/ Diff and CMP from 05/31/21 assessed, no baseline dose adjustments required at this time. Labs likely reflective of most previous treatment with cycle 1 of FOLFOXIRI from 05/16/21. Last EKG available from 03/13/21, Qtc WNL (415 ms). Noted K 3.4 mmol/L, recommend close monitoring for need of replacement as Braftovi can cause hypokalemia.   Current medication list in Epic reviewed, no relevant/significant DDIs with Braftovi identified.  Evaluated chart and no patient barriers to medication adherence noted.   Prescription has been e-scribed to the [facility] for benefits analysis and approval.  Oral Oncology Clinic will continue to follow for insurance authorization, copayment issues, initial counseling and start date.  Leron Croak, PharmD, BCPS Hematology/Oncology Clinical Pharmacist [facility] [place] [phone] 06/01/2021 8:19 AM

## 2021-06-01 NOTE — Telephone Encounter (Signed)
Oral Oncology Patient Advocate Encounter  Prior Authorization for Kathi Der has been approved.    PA# [id number] Effective dates: 06/01/21 through 06/01/22  Patient must use [employer].  Oral Oncology Clinic will continue to follow.   [facility] Phone [phone] Fax [phone] 06/01/2021 1:44 PM

## 2021-06-01 NOTE — Telephone Encounter (Signed)
Oral Oncology Patient Advocate Encounter   Received notification from [employer] that prior authorization for Braftovi is required.   PA submitted on CoverMyMeds Key [id number] Status is pending   Oral Oncology Clinic will continue to follow.  [facility] Patient [place] Phone [phone] Fax [phone] 06/01/2021 10:07 AM

## 2021-06-02 ENCOUNTER — Other Ambulatory Visit: Payer: Self-pay

## 2021-06-02 ENCOUNTER — Encounter: Payer: Self-pay | Admitting: Family

## 2021-06-02 ENCOUNTER — Inpatient Hospital Stay: Payer: 59

## 2021-06-02 ENCOUNTER — Encounter: Payer: Self-pay | Admitting: Hematology & Oncology

## 2021-06-02 VITALS — BP 113/62 | HR 65 | Temp 98.0°F | Resp 17

## 2021-06-02 DIAGNOSIS — D509 Iron deficiency anemia, unspecified: Secondary | ICD-10-CM

## 2021-06-02 DIAGNOSIS — D649 Anemia, unspecified: Secondary | ICD-10-CM

## 2021-06-02 DIAGNOSIS — Z5112 Encounter for antineoplastic immunotherapy: Secondary | ICD-10-CM | POA: Diagnosis not present

## 2021-06-02 MED ORDER — SODIUM CHLORIDE 0.9 % IV SOLN: Freq: Once | INTRAVENOUS | Status: AC

## 2021-06-02 MED ORDER — SODIUM CHLORIDE 0.9 % IV SOLN
125.0000 mg | Freq: Once | INTRAVENOUS | Status: AC
  Administered 2021-06-02: 125 mg via INTRAVENOUS
  Filled 2021-06-02: qty 125

## 2021-06-02 MED ORDER — HEPARIN SOD (PORK) LOCK FLUSH 100 UNIT/ML IV SOLN: 500.0000 [IU] | Freq: Once | INTRAVENOUS | Status: AC

## 2021-06-02 MED ORDER — SODIUM CHLORIDE 0.9% FLUSH: 10.0000 mL | Freq: Once | INTRAVENOUS | Status: AC | PRN

## 2021-06-02 NOTE — Patient Instructions (Signed)
Sodium Ferric Gluconate Complex Injection What is this medication? SODIUM FERRIC GLUCONATE COMPLEX (SOE dee um FER ik GLOO koe nate KOM pleks) treats low levels of iron (iron deficiency anemia) in people with kidney disease. Iron is a mineral that plays an important role in making red blood cells, which carry oxygen from your lungs to the rest of your body. This medicine may be used for other purposes; ask your health care provider or pharmacist if you have questions. COMMON BRAND NAME(S): Ferrlecit, Nulecit What should I tell my care team before I take this medication? They need to know if you have any of the following conditions: Anemia that is not from iron deficiency High levels of iron in the blood An unusual or allergic reaction to iron, other medications, foods, dyes, or preservatives Pregnant or are trying to become pregnant Breast-feeding How should I use this medication? This medication is injected into a vein. It is given by your care team in a hospital or clinic setting. Talk to your care team about the use of this medication in children. While it may be prescribed for children as young as 6 years for selected conditions, precautions do apply. Overdosage: If you think you have taken too much of this medicine contact a poison control center or emergency room at once. NOTE: This medicine is only for you. Do not share this medicine with others. What if I miss a dose? It is important not to miss your dose. Call your care team if you are unable to keep an appointment. What may interact with this medication? Do not take this medication with any of the following: Deferasirox Deferoxamine Dimercaprol This medication may also interact with the following: Other iron products This list may not describe all possible interactions. Give your health care provider a list of all the medicines, herbs, non-prescription drugs, or dietary supplements you use. Also tell them if you smoke, drink  alcohol, or use illegal drugs. Some items may interact with your medicine. What should I watch for while using this medication? Your condition will be monitored carefully while you are receiving this medication. Visit your care team for regular checks on your progress. You may need blood work while you are taking this medication. What side effects may I notice from receiving this medication? Side effects that you should report to your care team as soon as possible: Allergic reactions--skin rash, itching, hives, swelling of the face, lips, tongue, or throat Low blood pressure--dizziness, feeling faint or lightheaded, blurry vision Shortness of breath Side effects that usually do not require medical attention (report to your care team if they continue or are bothersome): Flushing Headache Joint pain Muscle pain Nausea Pain, redness, or irritation at injection site This list may not describe all possible side effects. Call your doctor for medical advice about side effects. You may report side effects to FDA at 1-800-FDA-1088. Where should I keep my medication? This medication is given in a hospital or clinic and will not be stored at home. NOTE: This sheet is a summary. It may not cover all possible information. If you have questions about this medicine, talk to your doctor, pharmacist, or health care provider.  2022 Elsevier/Gold Standard (2020-10-07 00:00:00) Fluorouracil, 5-FU injection What is this medication? FLUOROURACIL, 5-FU (flure oh YOOR a sil) is a chemotherapy drug. It slows the growth of cancer cells. This medicine is used to treat many types of cancer like breast cancer, colon or rectal cancer, pancreatic cancer, and stomach cancer. This medicine may  be used for other purposes; ask your health care provider or pharmacist if you have questions. COMMON BRAND NAME(S): Adrucil What should I tell my care team before I take this medication? They need to know if you have any of these  conditions: blood disorders dihydropyrimidine dehydrogenase (DPD) deficiency infection (especially a virus infection such as chickenpox, cold sores, or herpes) kidney disease liver disease malnourished, poor nutrition recent or ongoing radiation therapy an unusual or allergic reaction to fluorouracil, other chemotherapy, other medicines, foods, dyes, or preservatives pregnant or trying to get pregnant breast-feeding How should I use this medication? This drug is given as an infusion or injection into a vein. It is administered in a hospital or clinic by a specially trained health care professional. Talk to your pediatrician regarding the use of this medicine in children. Special care may be needed. Overdosage: If you think you have taken too much of this medicine contact a poison control center or emergency room at once. NOTE: This medicine is only for you. Do not share this medicine with others. What if I miss a dose? It is important not to miss your dose. Call your doctor or health care professional if you are unable to keep an appointment. What may interact with this medication? Do not take this medicine with any of the following medications: live virus vaccines This medicine may also interact with the following medications: medicines that treat or prevent blood clots like warfarin, enoxaparin, and dalteparin This list may not describe all possible interactions. Give your health care provider a list of all the medicines, herbs, non-prescription drugs, or dietary supplements you use. Also tell them if you smoke, drink alcohol, or use illegal drugs. Some items may interact with your medicine. What should I watch for while using this medication? Visit your doctor for checks on your progress. This drug may make you feel generally unwell. This is not uncommon, as chemotherapy can affect healthy cells as well as cancer cells. Report any side effects. Continue your course of treatment even  though you feel ill unless your doctor tells you to stop. In some cases, you may be given additional medicines to help with side effects. Follow all directions for their use. Call your doctor or health care professional for advice if you get a fever, chills or sore throat, or other symptoms of a cold or flu. Do not treat yourself. This drug decreases your body's ability to fight infections. Try to avoid being around people who are sick. This medicine may increase your risk to bruise or bleed. Call your doctor or health care professional if you notice any unusual bleeding. Be careful brushing and flossing your teeth or using a toothpick because you may get an infection or bleed more easily. If you have any dental work done, tell your dentist you are receiving this medicine. Avoid taking products that contain aspirin, acetaminophen, ibuprofen, naproxen, or ketoprofen unless instructed by your doctor. These medicines may hide a fever. Do not become pregnant while taking this medicine. Women should inform their doctor if they wish to become pregnant or think they might be pregnant. There is a potential for serious side effects to an unborn child. Talk to your health care professional or pharmacist for more information. Do not breast-feed an infant while taking this medicine. Men should inform their doctor if they wish to father a child. This medicine may lower sperm counts. Do not treat diarrhea with over the counter products. Contact your doctor if you have diarrhea  that lasts more than 2 days or if it is severe and watery. This medicine can make you more sensitive to the sun. Keep out of the sun. If you cannot avoid being in the sun, wear protective clothing and use sunscreen. Do not use sun lamps or tanning beds/booths. What side effects may I notice from receiving this medication? Side effects that you should report to your doctor or health care professional as soon as possible: allergic reactions like  skin rash, itching or hives, swelling of the face, lips, or tongue low blood counts - this medicine may decrease the number of white blood cells, red blood cells and platelets. You may be at increased risk for infections and bleeding. signs of infection - fever or chills, cough, sore throat, pain or difficulty passing urine signs of decreased platelets or bleeding - bruising, pinpoint red spots on the skin, black, tarry stools, blood in the urine signs of decreased red blood cells - unusually weak or tired, fainting spells, lightheadedness breathing problems changes in vision chest pain mouth sores nausea and vomiting pain, swelling, redness at site where injected pain, tingling, numbness in the hands or feet redness, swelling, or sores on hands or feet stomach pain unusual bleeding Side effects that usually do not require medical attention (report to your doctor or health care professional if they continue or are bothersome): changes in finger or toe nails diarrhea dry or itchy skin hair loss headache loss of appetite sensitivity of eyes to the light stomach upset unusually teary eyes This list may not describe all possible side effects. Call your doctor for medical advice about side effects. You may report side effects to FDA at 1-800-FDA-1088. Where should I keep my medication? This drug is given in a hospital or clinic and will not be stored at home. NOTE: This sheet is a summary. It may not cover all possible information. If you have questions about this medicine, talk to your doctor, pharmacist, or health care provider.  2022 Elsevier/Gold Standard (2021-01-31 00:00:00)

## 2021-06-02 NOTE — Progress Notes (Signed)
Patient continues to have difficulty with tolerating her treatment. Since she has a BRAF mutation, her treatment will be changed. She will still receive current treatment today at reduced dose to cover her until insurance authorization is obtained.   Oncology Nurse Navigator Documentation  Oncology Nurse Navigator Flowsheets 05/31/2021  Abnormal Finding Date -  Confirmed Diagnosis Date -  Diagnosis Status -  Phase of Treatment -  Chemotherapy Pending- Reason: -  Chemotherapy Actual Start Date: -  Navigator Follow Up Date: 06/29/2021  Navigator Follow Up Reason: Follow-up Appointment;Chemotherapy  Navigator Location [facility]  Navigator Encounter Type Treatment;Appt/Treatment Plan Review  Telephone -  Treatment Initiated Date -  Patient Visit Type MedOnc  Treatment Phase Active Tx  Barriers/Navigation Needs Coordination of Care;Education  Education -  Interventions Psycho-Social Support  Acuity Level 2-Minimal Needs (1-2 Barriers Identified)  Referrals -  Coordination of Care -  Education Method -  Support Groups/Services Friends and Family  Time Spent with Patient 15

## 2021-06-05 ENCOUNTER — Inpatient Hospital Stay (HOSPITAL_BASED_OUTPATIENT_CLINIC_OR_DEPARTMENT_OTHER): Payer: 59 | Admitting: Licensed Clinical Social Worker

## 2021-06-05 ENCOUNTER — Inpatient Hospital Stay: Payer: 59

## 2021-06-05 ENCOUNTER — Other Ambulatory Visit: Payer: Self-pay

## 2021-06-05 ENCOUNTER — Encounter: Payer: Self-pay | Admitting: Licensed Clinical Social Worker

## 2021-06-05 DIAGNOSIS — C787 Secondary malignant neoplasm of liver and intrahepatic bile duct: Secondary | ICD-10-CM | POA: Diagnosis not present

## 2021-06-05 DIAGNOSIS — Z801 Family history of malignant neoplasm of trachea, bronchus and lung: Secondary | ICD-10-CM | POA: Diagnosis not present

## 2021-06-05 DIAGNOSIS — Z800 Family history of malignant neoplasm of digestive organs: Secondary | ICD-10-CM | POA: Diagnosis not present

## 2021-06-05 DIAGNOSIS — C189 Malignant neoplasm of colon, unspecified: Secondary | ICD-10-CM | POA: Diagnosis not present

## 2021-06-05 NOTE — Progress Notes (Signed)
REFERRING PROVIDER: [name], MD [address] [place],  [place] [address]  PRIMARY PROVIDER:  Donella Stade, PA-C  PRIMARY REASON FOR VISIT:  1. Colon cancer metastasized to liver ([place])   2. Family history of pancreatic cancer   3. Family history of lung cancer      HISTORY OF PRESENT ILLNESS:   [name], a 56 y.o. female, was seen for a [place] cancer genetics consultation at the request of Dr. [name] due to a personal and family history of cancer.  [name] presents to clinic today to discuss the possibility of a hereditary predisposition to cancer, genetic testing, and to further clarify her future cancer risks, as well as potential cancer risks for family members.   In 2022, at the age of 60, [name] was diagnosed with colon cancer. This is currently being treated with chemotherapy.  CANCER HISTORY:  Oncology History  Colon cancer metastasized to liver [facility])  05/05/2021 Initial Diagnosis   Colon cancer metastasized to liver [facility])   05/05/2021 Cancer Staging   Staging form: Colon and Rectum, AJCC 8th Edition - Clinical stage from 05/05/2021: cT3, cN1b, pM1 - Signed by [name], MD on 05/05/2021 Histologic grade (G): G2 Histologic grading system: 4 grade system    05/16/2021 - 05/31/2021 Chemotherapy   Patient is on Treatment Plan : COLORECTAL FOLFOXIRI + Bevacizumab q14d     06/29/2021 -  Chemotherapy   Patient is on Treatment Plan : COLORECTAL Panitumumab q14d ([facility] - Type Gene Only)        RISK FACTORS:  Menarche was at age 88.  First live birth at age 75.  OCP use for approximately  15  years.  Ovaries intact: yes.  Hysterectomy: no.  Menopausal status: premenopausal.  HRT use: 0 years. Mammogram within the last year: yes. Number of breast biopsies: 0.  Past Medical History:  Diagnosis Date   Colon cancer metastasized to liver ([place]) 05/05/2021   Family history of lung cancer    Family history of pancreatic cancer     Goals of care, counseling/discussion 05/05/2021    Past Surgical History:  Procedure Laterality Date   IR IMAGING GUIDED PORT INSERTION  05/11/2021   uterine ablation      Social History   Socioeconomic History   Marital status: Single    Spouse name: Not on file   Number of children: Not on file   Years of education: Not on file   Highest education level: Not on file  Occupational History   Not on file  Tobacco Use   Smoking status: Never   Smokeless tobacco: Never  Vaping Use   Vaping Use: Never used  Substance and Sexual Activity   Alcohol use: Yes    Comment: occasionally   Drug use: Not Currently   Sexual activity: Not on file  Other Topics Concern   Not on file  Social History Narrative   Not on file   Social Determinants of Health   Financial Resource Strain: Medium Risk   Difficulty of Paying Living Expenses: Somewhat hard  Food Insecurity: No Food Insecurity   Worried About Running Out of Food in the Last Year: Never true   Ran Out of Food in the Last Year: Never true  Transportation Needs: No Transportation Needs   Lack of Transportation (Medical): No   Lack of Transportation (Non-Medical): No  Physical Activity: Not on file  Stress: Not on file  Social Connections: Not  on file     FAMILY HISTORY:  We obtained a detailed, 4-generation family history.  Significant diagnoses are listed below: Family History  Problem Relation Age of Onset   Lung cancer Mother    Diabetes Maternal Grandmother    Pancreatic cancer Maternal Grandfather    Multiple sclerosis Maternal Aunt    Alcohol abuse Maternal Uncle    Rebecca Bullock has 1 son, 54, and 2 daughters, 18 and 31, no history of cancer. She has 1 brother that she is estranged from, no known history of cancer.  Rebecca Bullock mother died at 59 of lung cancer, she had history of smoking. Patient had 5 maternal uncles and 3 maternal aunts, no cancers, limited information. Maternal grandmother died in her 87s,  grandfather died of pancreatic cancer in his 61s.  Rebecca Bullock is estranged from her father's side of the family and has no information about him.   Rebecca Bullock is unaware of previous family history of genetic testing for hereditary cancer risks. Patient's maternal ancestors are of unknown descent, and paternal ancestors are of unknown descent. There is no reported Ashkenazi Jewish ancestry. There is no known consanguinity.    GENETIC COUNSELING ASSESSMENT: Rebecca Bullock is a 56 y.o. female with a personal and family history of cancer which is somewhat suggestive of a hereditary cancer syndrome and predisposition to cancer. We, therefore, discussed and recommended the following at today's visit.   DISCUSSION: We discussed that approximately 10% of cancer is hereditary. Most cases of hereditary colon cancer are associated with Lynch syndrome genes, although there are other genes associated with hereditary cancer as well including. Cancers and risks are gene specific.  We discussed that testing is beneficial for several reasons including knowing about other cancer risks, identifying potential screening and risk-reduction options that may be appropriate, and to understand if other family members could be at risk for cancer and allow them to undergo genetic testing.   We reviewed the characteristics, features and inheritance patterns of hereditary cancer syndromes. We also discussed genetic testing, including the appropriate family members to test, the process of testing, insurance coverage and turn-around-time for results. We discussed the implications of a negative, positive and/or variant of uncertain significant result. We recommended Ms. Geers pursue genetic testing for the [facility] Multi-Cancer+RNA gene panel, which she had drawn several weeks ago.    GENETIC TEST RESULTS: Genetic testing reported out on 05/22/2021 through the [facility] Multi-Cancer+RNA cancer panel found no pathogenic mutations.   The  Multi-Cancer Panel + RNA offered by [facility] includes sequencing and/or deletion duplication testing of the following 84 genes: AIP, ALK, APC, ATM, AXIN2,BAP1,  BARD1, BLM, BMPR1A, BRCA1, BRCA2, BRIP1, CASR, CDC73, CDH1, CDK4, CDKN1B, CDKN1C, CDKN2A (p14ARF), CDKN2A (p16INK4a), CEBPA, CHEK2, CTNNA1, DICER1, DIS3L2, EGFR (c.2369C>T, p.Thr790Met variant only), EPCAM (Deletion/duplication testing only), FH, FLCN, GATA2, GPC3, GREM1 (Promoter region deletion/duplication testing only), HOXB13 (c.251G>A, p.Gly84Glu), HRAS, KIT, MAX, MEN1, MET, MITF (c.952G>A, p.Glu318Lys variant only), MLH1, MSH2, MSH3, MSH6, MUTYH, NBN, NF1, NF2, NTHL1, PALB2, PDGFRA, PHOX2B, PMS2, POLD1, POLE, POT1, PRKAR1A, PTCH1, PTEN, RAD50, RAD51C, RAD51D, RB1, RECQL4, RET, RUNX1, SDHAF2, SDHA (sequence changes only), SDHB, SDHC, SDHD, SMAD4, SMARCA4, SMARCB1, SMARCE1, STK11, SUFU, TERC, TERT, TMEM127, TP53, TSC1, TSC2, VHL, WRN and WT1.   The test report has been scanned into EPIC and is located under the Molecular Pathology section of the Results Review tab.  A portion of the result report is included below for reference.     We discussed that because current genetic testing  is not perfect, it is possible there may be a gene mutation in one of these genes that current testing cannot detect, but that chance is small.  There could be another gene that has not yet been discovered, or that we have not yet tested, that is responsible for the cancer diagnoses in the family. It is also possible there is a hereditary cause for the cancer in the family that Ms. Hart did not inherit and therefore was not identified in her testing.  Therefore, it is important to remain in touch with cancer genetics in the future so that we can continue to offer Ms. Stormes the most up to date genetic testing.   ADDITIONAL GENETIC TESTING: We discussed with Ms. Leatherwood that her genetic testing was fairly extensive.  If there are genes identified to increase cancer risk  that can be analyzed in the future, we would be happy to discuss and coordinate this testing at that time.    CANCER SCREENING RECOMMENDATIONS: Ms. Beers test result is considered negative (normal).  This means that we have not identified a hereditary cause for her  personal and family history of cancer at this time. Most cancers happen by chance and this negative test suggests that her cancer may fall into this category.    While reassuring, this does not definitively rule out a hereditary predisposition to cancer. It is still possible that there could be genetic mutations that are undetectable by current technology. There could be genetic mutations in genes that have not been tested or identified to increase cancer risk.  Therefore, it is recommended she continue to follow the cancer management and screening guidelines provided by her oncology and primary healthcare provider.   An individual's cancer risk and medical management are not determined by genetic test results alone. Overall cancer risk assessment incorporates additional factors, including personal medical history, family history, and any available genetic information that may result in a personalized plan for cancer prevention and surveillance.  RECOMMENDATIONS FOR FAMILY MEMBERS:  Relatives in this family might be at some increased risk of developing cancer, over the general population risk, simply due to the family history of cancer.  We recommended female relatives in this family have a yearly mammogram beginning at age 9, or 95 years younger than the earliest onset of cancer, an annual clinical breast exam, and perform monthly breast self-exams. Female relatives in this family should also have a gynecological exam as recommended by their primary provider.  All family members should be referred for colonoscopy starting at age 54.    It is also possible there is a hereditary cause for the cancer in Ms. Henner's family that she did not  inherit and therefore was not identified in her.  Based on Ms. Gasper's family history, we recommended those related to her grandfather with pancreatic cancer have genetic counseling and testing. Ms. Newton will let us know if we can be of any assistance in coordinating genetic counseling and/or testing for these family members.  FOLLOW-UP: Lastly, we discussed with Ms. Lookabaugh that cancer genetics is a rapidly advancing field and it is possible that new genetic tests will be appropriate for her and/or her family members in the future. We encouraged her to remain in contact with cancer genetics on an annual basis so we can update her personal and family histories and let her know of advances in cancer genetics that may benefit this family.   Our contact number was provided. Ms. Antony questions were answered to  her satisfaction, and she knows she is welcome to call us at anytime with additional questions or concerns.   Faith Rogue, MS, [facility] Genetic Counselor [email or web address] Phone: [phone]  The patient was seen for a total of 15 minutes in face-to-face genetic counseling. Patient was seen with her significant other Annie Main. Dr. Grayland Ormond was available for discussion regarding this case.   _______________________________________________________________________ For Office Staff:  Number of people involved in session: 2 Was an Intern/ student involved with case: no

## 2021-06-07 ENCOUNTER — Other Ambulatory Visit: Payer: Self-pay | Admitting: Hematology & Oncology

## 2021-06-07 NOTE — Telephone Encounter (Signed)
Oral Chemotherapy Pharmacist Encounter   Attempted to reach patient to provide update and offer for initial counseling on oral medication: Braftovi (encorafenib).   No answer. Left voicemail for patient to call back to discuss details of medication acquisition and initial counseling session.  Leron Croak, PharmD, BCPS Hematology/Oncology Clinical Pharmacist [facility] [place] [phone] 06/07/2021 11:30 AM

## 2021-06-09 NOTE — Telephone Encounter (Signed)
Oral Chemotherapy Pharmacist Encounter   Attempted to reach patient to provide update and offer for initial counseling on oral medication: Braftovi (encorafenib).   No answer. Left voicemail for patient to call back to discuss details of medication acquisition and initial counseling session.  Leron Croak, PharmD, BCPS Hematology/Oncology Clinical Pharmacist [facility] [place] [phone] 06/09/2021  11:41 AM

## 2021-06-12 NOTE — Progress Notes (Unsigned)
Pharmacist Chemotherapy Monitoring - Initial Assessment    Anticipated start date: 06/19/2021   The following has been reviewed per standard work regarding the patient's treatment regimen: The patient's diagnosis, treatment plan and drug doses, and organ/hematologic function Lab orders and baseline tests specific to treatment regimen  The treatment plan start date, drug sequencing, and pre-medications Prior authorization status  Patient's documented medication list, including drug-drug interaction screen and prescriptions for anti-emetics and supportive care specific to the treatment regimen The drug concentrations, fluid compatibility, administration routes, and timing of the medications to be used The patient's access for treatment and lifetime cumulative dose history, if applicable  The patient's medication allergies and previous infusion related reactions, if applicable   Changes made to treatment plan:  treatment plan date  Follow up needed:  N/A   Janaia Kozel, Jacqlyn Larsen, [facility], 06/12/2021  12:21 PM

## 2021-06-13 NOTE — Telephone Encounter (Signed)
Oral Chemotherapy Pharmacist Encounter  I spoke with patient for overview of: Braftovi (encorafenib) for the treatment of metastatic colon cancer, BRAF V600E positive in conjunction with Vectibix, planned duration until disease progression or unacceptable drug toxicity.   Counseled patient on administration, dosing, side effects, monitoring, drug-food interactions, safe handling, storage, and disposal.  Patient will take Braftovi 75 mg capsules, 4 capsules (300 mg) by mouth once daily, without regard to food.  Patient knows to avoid grapefruit and grapefruit juice while on therapy with Braftovi.  Braftovi and Vectibix start date: 06/19/21  Adverse effects of Braftovi include but are not limited to: Fatigue, nausea, vomiting, abdominal pain, arthralgia, headache, hyperkeratosis or HFS, uveitis (eye pain, photophobia, vision changes), hemorrhage, and QTc prolongation.  Reviewed with patient importance of keeping a medication schedule and plan for any missed doses. No barriers to medication adherence identified.  Medication reconciliation performed and medication/allergy list updated.  Insurance authorization for [employer] has been obtained. Patient's insurance requires this to be filled through [employer] This has already been delivered to patient's home.   All questions answered.  Rebecca Bullock voiced understanding and appreciation.   Medication education handout placed in mail for patient. Patient knows to call the office with questions or concerns. Oral Chemotherapy Clinic phone number provided to patient.   Leron Croak, PharmD, BCPS Hematology/Oncology Clinical Pharmacist [facility] [place] [phone] 06/13/2021 9:20 AM

## 2021-06-16 ENCOUNTER — Other Ambulatory Visit: Payer: Self-pay | Admitting: *Deleted

## 2021-06-16 DIAGNOSIS — C189 Malignant neoplasm of colon, unspecified: Secondary | ICD-10-CM

## 2021-06-16 DIAGNOSIS — D509 Iron deficiency anemia, unspecified: Secondary | ICD-10-CM

## 2021-06-16 DIAGNOSIS — Z800 Family history of malignant neoplasm of digestive organs: Secondary | ICD-10-CM

## 2021-06-19 ENCOUNTER — Emergency Department (HOSPITAL_COMMUNITY)
Admission: EM | Admit: 2021-06-19 | Discharge: 2021-06-20 | Disposition: A | Discharge status: Home / Self Care | Payer: 59 | Attending: Emergency Medicine | Admitting: Emergency Medicine

## 2021-06-19 ENCOUNTER — Inpatient Hospital Stay: Payer: 59

## 2021-06-19 ENCOUNTER — Encounter (HOSPITAL_COMMUNITY): Payer: Self-pay

## 2021-06-19 ENCOUNTER — Other Ambulatory Visit: Payer: Self-pay

## 2021-06-19 ENCOUNTER — Emergency Department (HOSPITAL_COMMUNITY): Payer: 59

## 2021-06-19 DIAGNOSIS — R1084 Generalized abdominal pain: Secondary | ICD-10-CM | POA: Insufficient documentation

## 2021-06-19 DIAGNOSIS — Z800 Family history of malignant neoplasm of digestive organs: Secondary | ICD-10-CM

## 2021-06-19 DIAGNOSIS — R509 Fever, unspecified: Secondary | ICD-10-CM | POA: Insufficient documentation

## 2021-06-19 DIAGNOSIS — Z85038 Personal history of other malignant neoplasm of large intestine: Secondary | ICD-10-CM | POA: Insufficient documentation

## 2021-06-19 DIAGNOSIS — C189 Malignant neoplasm of colon, unspecified: Secondary | ICD-10-CM

## 2021-06-19 DIAGNOSIS — Z7951 Long term (current) use of inhaled steroids: Secondary | ICD-10-CM | POA: Insufficient documentation

## 2021-06-19 DIAGNOSIS — R059 Cough, unspecified: Secondary | ICD-10-CM | POA: Diagnosis not present

## 2021-06-19 DIAGNOSIS — R110 Nausea: Secondary | ICD-10-CM | POA: Diagnosis not present

## 2021-06-19 DIAGNOSIS — W19XXXA Unspecified fall, initial encounter: Secondary | ICD-10-CM | POA: Insufficient documentation

## 2021-06-19 DIAGNOSIS — S300XXA Contusion of lower back and pelvis, initial encounter: Secondary | ICD-10-CM | POA: Insufficient documentation

## 2021-06-19 DIAGNOSIS — R519 Headache, unspecified: Secondary | ICD-10-CM | POA: Diagnosis not present

## 2021-06-19 DIAGNOSIS — R197 Diarrhea, unspecified: Secondary | ICD-10-CM | POA: Diagnosis not present

## 2021-06-19 DIAGNOSIS — D509 Iron deficiency anemia, unspecified: Secondary | ICD-10-CM

## 2021-06-19 DIAGNOSIS — Z20822 Contact with and (suspected) exposure to covid-19: Secondary | ICD-10-CM | POA: Diagnosis not present

## 2021-06-19 DIAGNOSIS — S3992XA Unspecified injury of lower back, initial encounter: Secondary | ICD-10-CM | POA: Diagnosis present

## 2021-06-19 LAB — CBC WITH DIFFERENTIAL/PLATELET
Abs Immature Granulocytes: 0.04 10*3/uL (ref 0.00–0.07)
Basophils Absolute: 0 10*3/uL (ref 0.0–0.1)
Basophils Relative: 0 %
Eosinophils Absolute: 0 10*3/uL (ref 0.0–0.5)
Eosinophils Relative: 1 %
HCT: 38.3 % (ref 36.0–46.0)
Hemoglobin: 11.5 g/dL — ABNORMAL LOW (ref 12.0–15.0)
Immature Granulocytes: 1 %
Lymphocytes Relative: 11 %
Lymphs Abs: 0.4 10*3/uL — ABNORMAL LOW (ref 0.7–4.0)
MCH: 23.2 pg — ABNORMAL LOW (ref 26.0–34.0)
MCHC: 30 g/dL (ref 30.0–36.0)
MCV: 77.4 fL — ABNORMAL LOW (ref 80.0–100.0)
Monocytes Absolute: 1.1 10*3/uL — ABNORMAL HIGH (ref 0.1–1.0)
Monocytes Relative: 28 %
Neutro Abs: 2.3 10*3/uL (ref 1.7–7.7)
Neutrophils Relative %: 59 %
Platelets: 243 10*3/uL (ref 150–400)
RBC: 4.95 MIL/uL (ref 3.87–5.11)
RDW: 20.9 % — ABNORMAL HIGH (ref 11.5–15.5)
WBC: 3.9 10*3/uL — ABNORMAL LOW (ref 4.0–10.5)
nRBC: 0 % (ref 0.0–0.2)

## 2021-06-19 LAB — CBC WITH DIFFERENTIAL (CANCER CENTER ONLY)
Abs Immature Granulocytes: 0.03 10*3/uL (ref 0.00–0.07)
Basophils Absolute: 0 10*3/uL (ref 0.0–0.1)
Basophils Relative: 0 %
Eosinophils Absolute: 0.1 10*3/uL (ref 0.0–0.5)
Eosinophils Relative: 2 %
HCT: 35.1 % — ABNORMAL LOW (ref 36.0–46.0)
Hemoglobin: 10.4 g/dL — ABNORMAL LOW (ref 12.0–15.0)
Immature Granulocytes: 1 %
Lymphocytes Relative: 36 %
Lymphs Abs: 1.4 10*3/uL (ref 0.7–4.0)
MCH: 22.8 pg — ABNORMAL LOW (ref 26.0–34.0)
MCHC: 29.6 g/dL — ABNORMAL LOW (ref 30.0–36.0)
MCV: 77 fL — ABNORMAL LOW (ref 80.0–100.0)
Monocytes Absolute: 0.9 10*3/uL (ref 0.1–1.0)
Monocytes Relative: 25 %
Neutro Abs: 1.4 10*3/uL — ABNORMAL LOW (ref 1.7–7.7)
Neutrophils Relative %: 36 %
Platelet Count: 254 10*3/uL (ref 150–400)
RBC: 4.56 MIL/uL (ref 3.87–5.11)
RDW: 20.2 % — ABNORMAL HIGH (ref 11.5–15.5)
WBC Count: 3.8 10*3/uL — ABNORMAL LOW (ref 4.0–10.5)
nRBC: 0 % (ref 0.0–0.2)

## 2021-06-19 LAB — CMP (CANCER CENTER ONLY)
ALT: 10 U/L (ref 0–44)
AST: 13 U/L — ABNORMAL LOW (ref 15–41)
Albumin: 3.9 g/dL (ref 3.5–5.0)
Alkaline Phosphatase: 131 U/L — ABNORMAL HIGH (ref 38–126)
Anion gap: 7 (ref 5–15)
BUN: 4 mg/dL — ABNORMAL LOW (ref 6–20)
CO2: 26 mmol/L (ref 22–32)
Calcium: 9.1 mg/dL (ref 8.9–10.3)
Chloride: 105 mmol/L (ref 98–111)
Creatinine: 0.64 mg/dL (ref 0.44–1.00)
GFR, Estimated: >60 mL/min (ref >60)
Glucose, Bld: 102 mg/dL — ABNORMAL HIGH (ref 70–99)
Potassium: 3.6 mmol/L (ref 3.5–5.1)
Sodium: 138 mmol/L (ref 135–145)
Total Bilirubin: 0.3 mg/dL (ref 0.3–1.2)
Total Protein: 6.8 g/dL (ref 6.5–8.1)

## 2021-06-19 IMAGING — DX DG CHEST 1V PORT
1 series · 1 of 1 positions shown · non-contrast
Comparison: [DATE]

CLINICAL DATA: Sepsis

EXAM:
PORTABLE CHEST 1 VIEW

[chest ap]
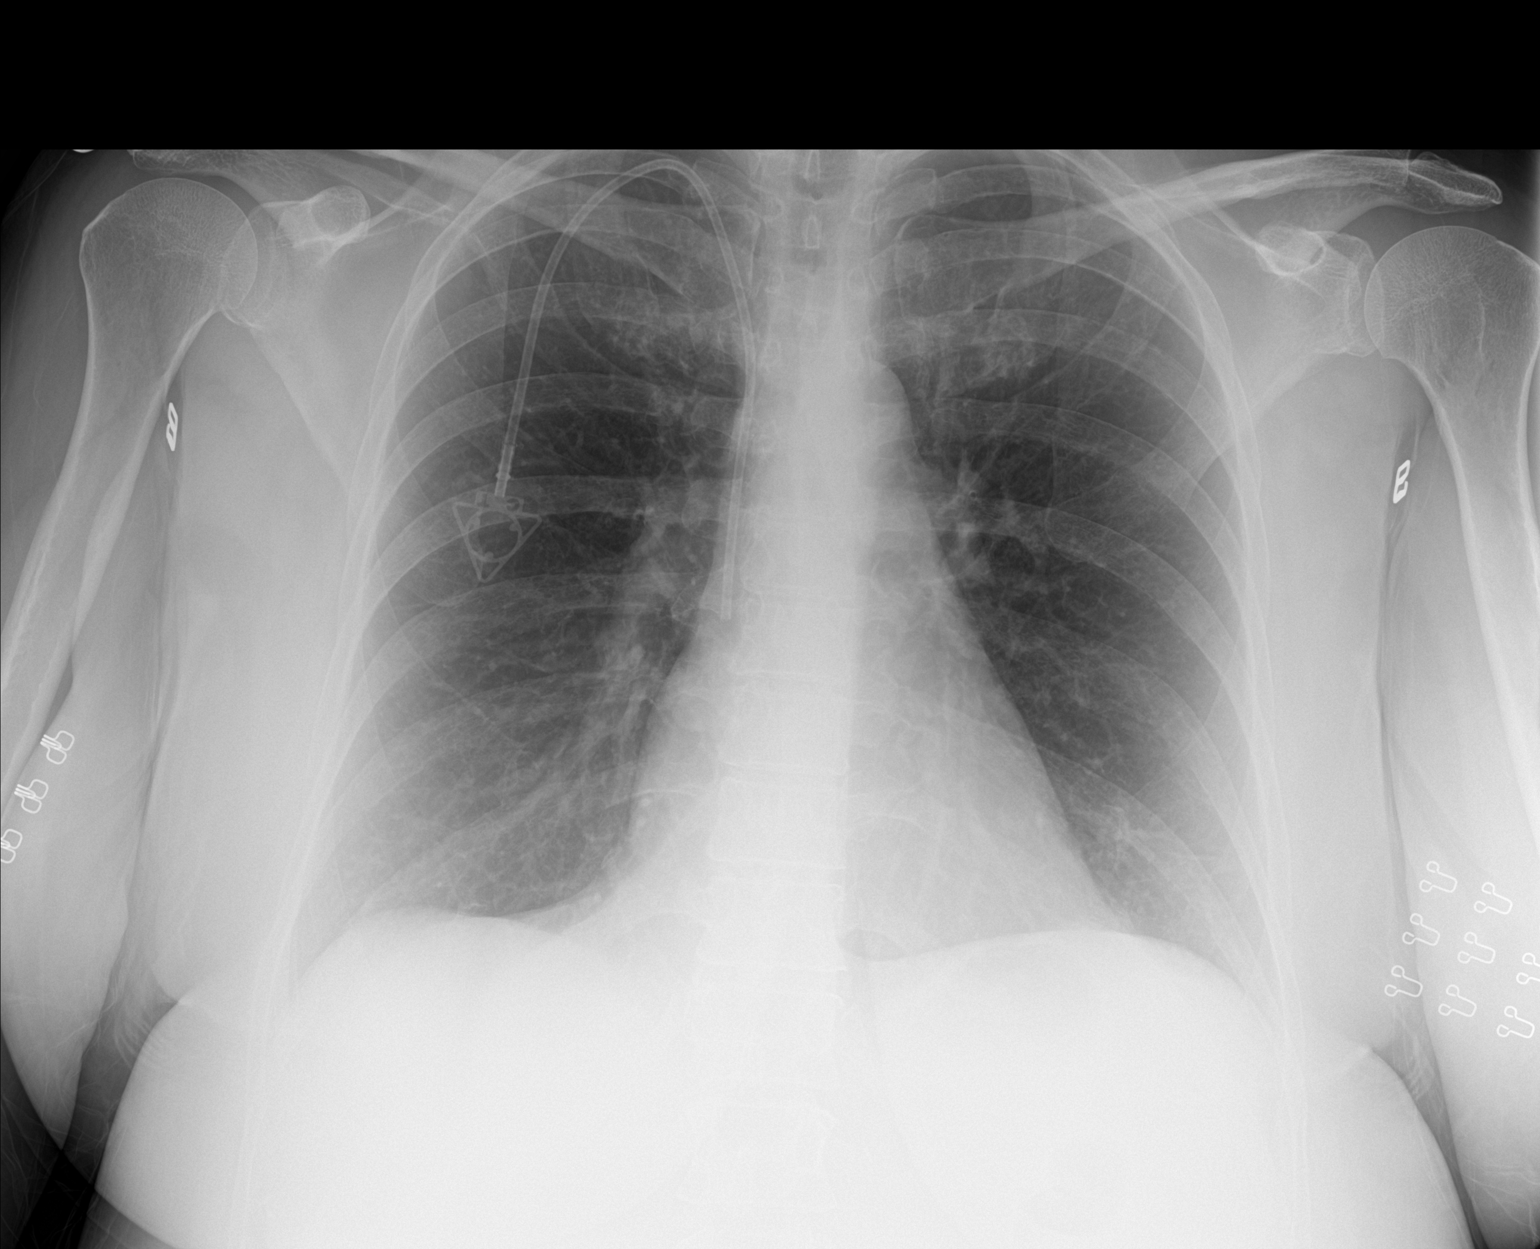

[1 of 1 positions shown; findings below may reference images not displayed]

FINDINGS: Lungs are well expanded, symmetric, and clear. No pneumothorax or
pleural effusion. Cardiac size within normal limits. Right internal
jugular chest port has been placed in the interval with its tip seen
within the superior vena cava. Pulmonary vascularity is normal.
Osseous structures are age-appropriate. No acute bone abnormality.
IMPRESSION: No active disease.

Interval right chest port placement, tip within the SVC.

## 2021-06-19 MED ORDER — LACTATED RINGERS IV BOLUS (SEPSIS)
500.0000 mL | Freq: Once | INTRAVENOUS | Status: AC
  Administered 2021-06-20: 01:00:00 500 mL via INTRAVENOUS

## 2021-06-19 MED ORDER — VANCOMYCIN HCL IN DEXTROSE 1-5 GM/200ML-% IV SOLN
1000.0000 mg | Freq: Once | INTRAVENOUS | Status: AC
  Administered 2021-06-20: 1000 mg via INTRAVENOUS
  Filled 2021-06-19: qty 200

## 2021-06-19 MED ORDER — SODIUM CHLORIDE 0.9 % IV SOLN
6.0000 mg/kg | Freq: Once | INTRAVENOUS | Status: AC
  Administered 2021-06-19: 500 mg via INTRAVENOUS
  Filled 2021-06-19: qty 20

## 2021-06-19 MED ORDER — SODIUM CHLORIDE 0.9 % IV SOLN
2.0000 g | Freq: Once | INTRAVENOUS | Status: AC
  Administered 2021-06-19: 2 g via INTRAVENOUS
  Filled 2021-06-19: qty 2

## 2021-06-19 MED ORDER — SODIUM CHLORIDE 0.9 % IV SOLN: Freq: Once | INTRAVENOUS | Status: AC

## 2021-06-19 MED ORDER — LACTATED RINGERS IV BOLUS (SEPSIS)
1000.0000 mL | Freq: Once | INTRAVENOUS | Status: AC
  Administered 2021-06-19: 1000 mL via INTRAVENOUS

## 2021-06-19 MED ORDER — SODIUM CHLORIDE 0.9% FLUSH
10.0000 mL | INTRAVENOUS | Status: DC | PRN
  Administered 2021-06-19: 10 mL

## 2021-06-19 MED ORDER — HEPARIN SOD (PORK) LOCK FLUSH 100 UNIT/ML IV SOLN
500.0000 [IU] | Freq: Once | INTRAVENOUS | Status: AC | PRN
  Administered 2021-06-19: 500 [IU]

## 2021-06-19 MED ORDER — LACTATED RINGERS IV SOLN: INTRAVENOUS | Status: DC

## 2021-06-19 MED ORDER — LACTATED RINGERS IV BOLUS (SEPSIS)
1000.0000 mL | Freq: Once | INTRAVENOUS | Status: AC
  Administered 2021-06-20: 1000 mL via INTRAVENOUS

## 2021-06-19 NOTE — ED Triage Notes (Signed)
Reports beginning new meds today for colon cancer mets to the liver. Since then has developed a headache, fever, and nausea.

## 2021-06-19 NOTE — Progress Notes (Signed)
A consult was received from an ED physician for vancomycin and cefepime per pharmacy dosing.  The patient's profile has been reviewed for ht/wt/allergies/indication/available labs.   A one time order has been placed for vancomycin 1gm and cefepime 2gm.  Further antibiotics/pharmacy consults should be ordered by admitting physician if indicated.                       Thank you, Dolly Rias RPh 06/19/2021, 11:19 PM

## 2021-06-19 NOTE — Sepsis Progress Note (Signed)
Elink following for Sepsis Protocol 

## 2021-06-19 NOTE — Patient Instructions (Signed)

## 2021-06-19 NOTE — Patient Instructions (Signed)
[facility] [place]  Discharge Instructions: Thank you for choosing [facility] to provide your oncology and hematology care.   If you have a lab appointment with the [facility], please go directly to the [facility] and check in at the registration area.  Wear comfortable clothing and clothing appropriate for easy access to any Portacath or PICC line.   We strive to give you quality time with your provider. You may need to reschedule your appointment if you arrive late (15 or more minutes).  Arriving late affects you and other patients whose appointments are after yours.  Also, if you miss three or more appointments without notifying the office, you may be dismissed from the clinic at the providers discretion.      For prescription refill requests, have your pharmacy contact our office and allow 72 hours for refills to be completed.    Today you received the following chemotherapy and/or immunotherapy agents Vectibix      To help prevent nausea and vomiting after your treatment, we encourage you to take your nausea medication as directed.  BELOW ARE SYMPTOMS THAT SHOULD BE REPORTED IMMEDIATELY: *FEVER GREATER THAN 100.4 F (38 C) OR HIGHER *CHILLS OR SWEATING *NAUSEA AND VOMITING THAT IS NOT CONTROLLED WITH YOUR NAUSEA MEDICATION *UNUSUAL SHORTNESS OF BREATH *UNUSUAL BRUISING OR BLEEDING *URINARY PROBLEMS (pain or burning when urinating, or frequent urination) *BOWEL PROBLEMS (unusual diarrhea, constipation, pain near the anus) TENDERNESS IN MOUTH AND THROAT WITH OR WITHOUT PRESENCE OF ULCERS (sore throat, sores in mouth, or a toothache) UNUSUAL RASH, SWELLING OR PAIN  UNUSUAL VAGINAL DISCHARGE OR ITCHING   Items with * indicate a potential emergency and should be followed up as soon as possible or go to the Emergency Department if any problems should occur.  Please show the CHEMOTHERAPY ALERT CARD or IMMUNOTHERAPY ALERT CARD at check-in to the  Emergency Department and triage nurse. Should you have questions after your visit or need to cancel or reschedule your appointment, please contact [facility]  [phone] and follow the prompts.  Office hours are 8:00 a.m. to 4:30 p.m. Monday - Friday. Please note that voicemails left after 4:00 p.m. may not be returned until the following business day.  We are closed weekends and major holidays. You have access to a nurse at all times for urgent questions. Please call the main number to the clinic [phone] and follow the prompts.  For any non-urgent questions, you may also contact your provider using MyChart. We now offer e-Visits for anyone 86 and older to request care online for non-urgent symptoms. For details visit [email or web address].   Also download the MyChart app! Go to the app store, search "MyChart", open the app, select [facility], and log in with your MyChart username and password.  Due to Covid, a mask is required upon entering the hospital/clinic. If you do not have a mask, one will be given to you upon arrival. For doctor visits, patients may have 1 support person aged 23 or older with them. For treatment visits, patients cannot have anyone with them due to current Covid guidelines and our immunocompromised population.

## 2021-06-20 ENCOUNTER — Encounter (HOSPITAL_COMMUNITY): Payer: Self-pay

## 2021-06-20 ENCOUNTER — Encounter: Payer: Self-pay | Admitting: *Deleted

## 2021-06-20 ENCOUNTER — Other Ambulatory Visit: Payer: Self-pay

## 2021-06-20 ENCOUNTER — Emergency Department (HOSPITAL_COMMUNITY): Payer: 59

## 2021-06-20 LAB — COMPREHENSIVE METABOLIC PANEL
ALT: 14 U/L (ref 0–44)
AST: 17 U/L (ref 15–41)
Albumin: 3.6 g/dL (ref 3.5–5.0)
Alkaline Phosphatase: 126 U/L (ref 38–126)
Anion gap: 8 (ref 5–15)
BUN: 5 mg/dL — ABNORMAL LOW (ref 6–20)
CO2: 24 mmol/L (ref 22–32)
Calcium: 8.8 mg/dL — ABNORMAL LOW (ref 8.9–10.3)
Chloride: 104 mmol/L (ref 98–111)
Creatinine, Ser: 0.81 mg/dL (ref 0.44–1.00)
GFR, Estimated: >60 mL/min (ref >60)
Glucose, Bld: 140 mg/dL — ABNORMAL HIGH (ref 70–99)
Potassium: 3.6 mmol/L (ref 3.5–5.1)
Sodium: 136 mmol/L (ref 135–145)
Total Bilirubin: 0.4 mg/dL (ref 0.3–1.2)
Total Protein: 7.5 g/dL (ref 6.5–8.1)

## 2021-06-20 LAB — PROTIME-INR
INR: 1 (ref 0.8–1.2)
Prothrombin Time: 13.1 seconds (ref 11.4–15.2)

## 2021-06-20 LAB — URINALYSIS, ROUTINE W REFLEX MICROSCOPIC
Bacteria, UA: NONE SEEN
Bilirubin Urine: NEGATIVE
Glucose, UA: NEGATIVE mg/dL
Ketones, ur: NEGATIVE mg/dL
Nitrite: NEGATIVE
Protein, ur: NEGATIVE mg/dL
Specific Gravity, Urine: 1.013 (ref 1.005–1.030)
pH: 5 (ref 5.0–8.0)

## 2021-06-20 LAB — RESP PANEL BY RT-PCR (FLU A&B, COVID) ARPGX2
Influenza A by PCR: NEGATIVE
Influenza B by PCR: NEGATIVE
SARS Coronavirus 2 by RT PCR: NEGATIVE

## 2021-06-20 LAB — APTT: aPTT: 26 seconds (ref 24–36)

## 2021-06-20 LAB — LACTIC ACID, PLASMA: Lactic Acid, Venous: 1.4 mmol/L (ref 0.5–1.9)

## 2021-06-20 IMAGING — CT CT ABD-PELV W/ CM
2 of 5 series · 14 of 46 positions shown, 16 images · IV contrast (OMNIPAQUE 300)
Comparison: PET-CT [DATE].  Abdominal MRI [DATE].

CLINICAL DATA: 55-year-old female with history of acute onset of
nonlocalized abdominal pain. Fever. Nausea. History of colon cancer
with known liver metastases.

EXAM:
CT ABDOMEN AND PELVIS WITH CONTRAST
TECHNIQUE: Multidetector CT imaging of the abdomen and pelvis was performed
using the standard protocol following bolus administration of
intravenous contrast.

[Series 2: axial st · axial · 0.71mm/px · z∈[-567,-172]mm · 11 of 91 slices shown, 13 images]
[im 6/91  soft-tissue]
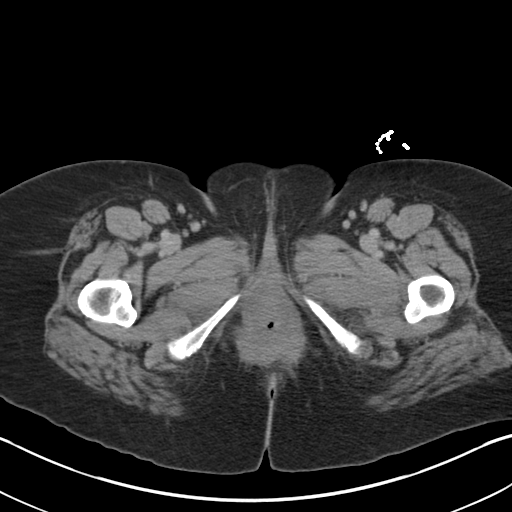
[im 6/91  bone]
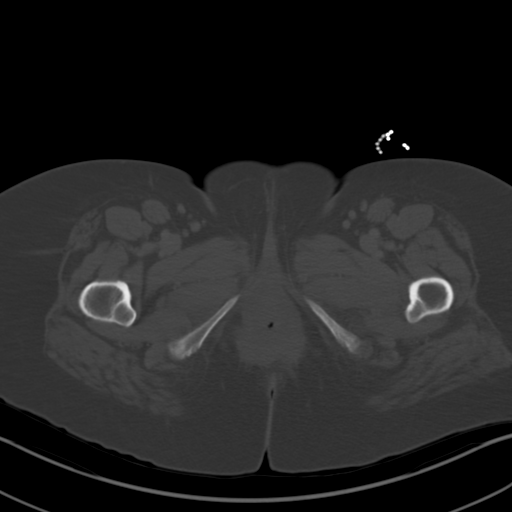
[im 16/91  soft-tissue]
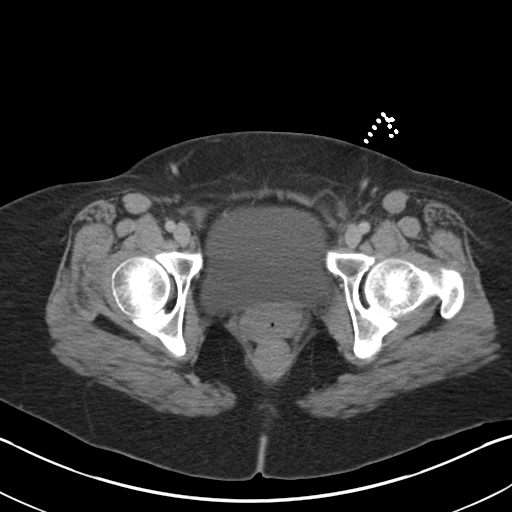
[im 22/91  soft-tissue]
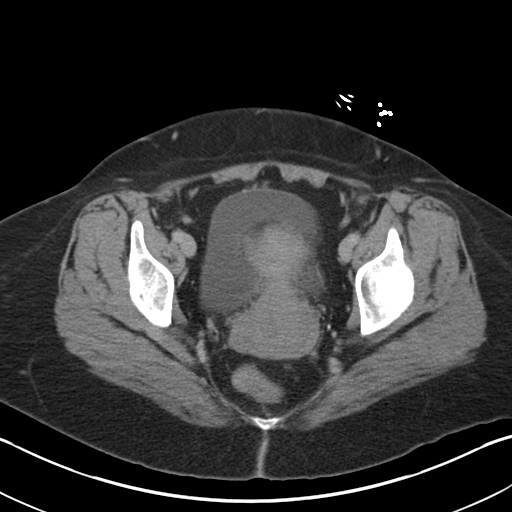
[im 32/91  soft-tissue]
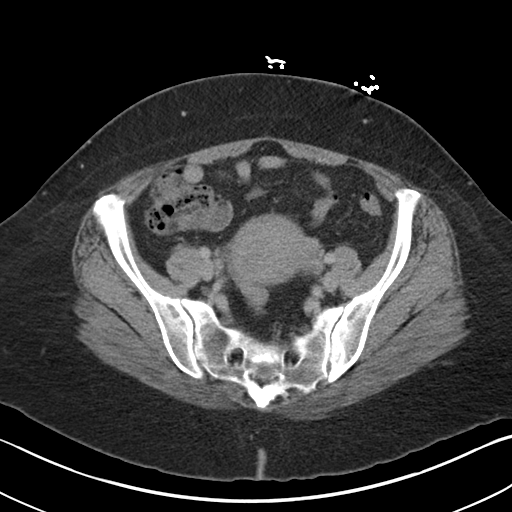
[im 38/91  soft-tissue]
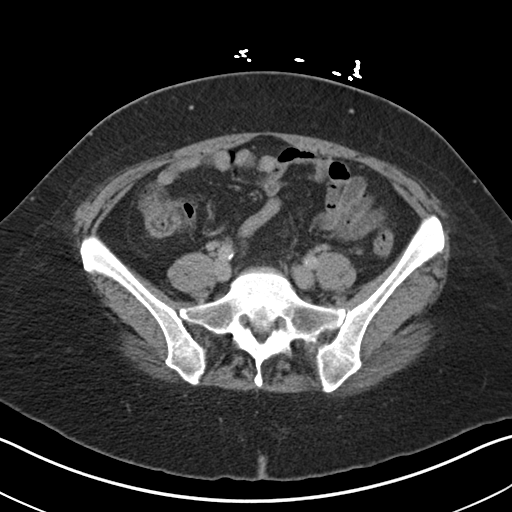
[im 48/91  soft-tissue]
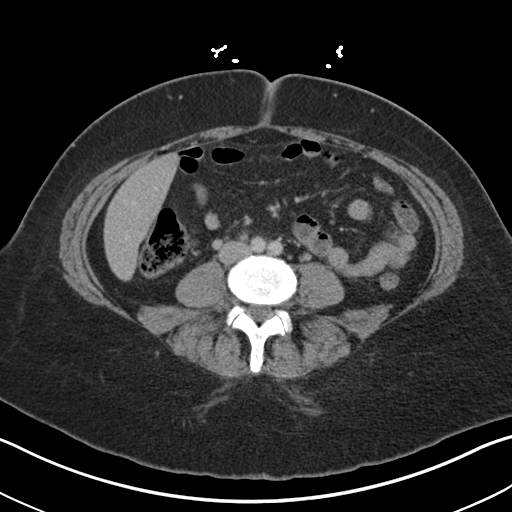
[im 53/91  soft-tissue]
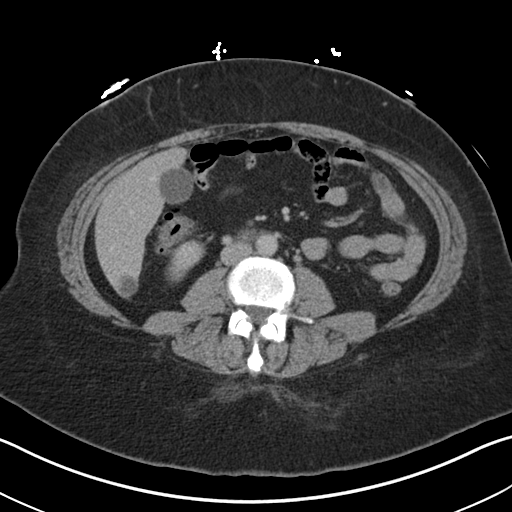
[im 59/91  soft-tissue]
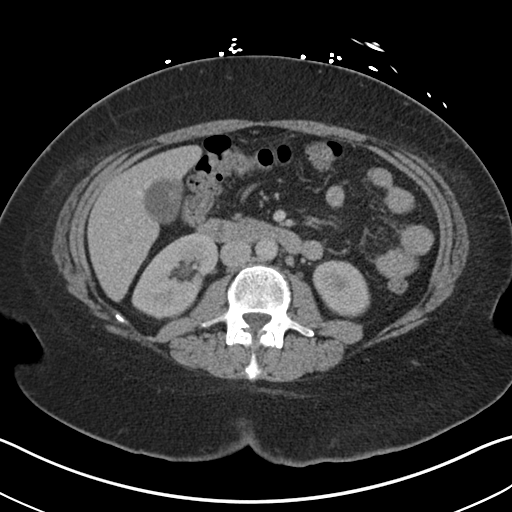
[im 69/91  soft-tissue]
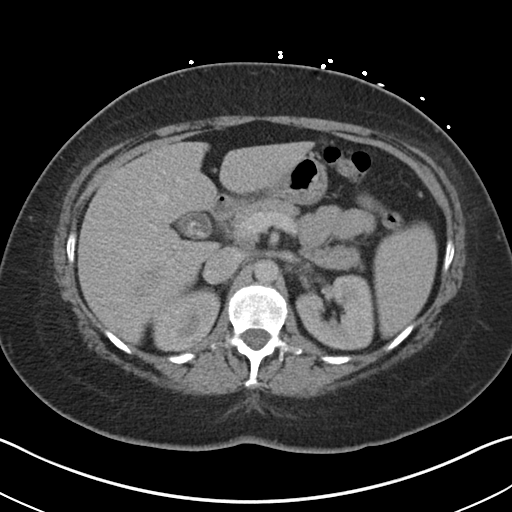
[im 69/91  bone]
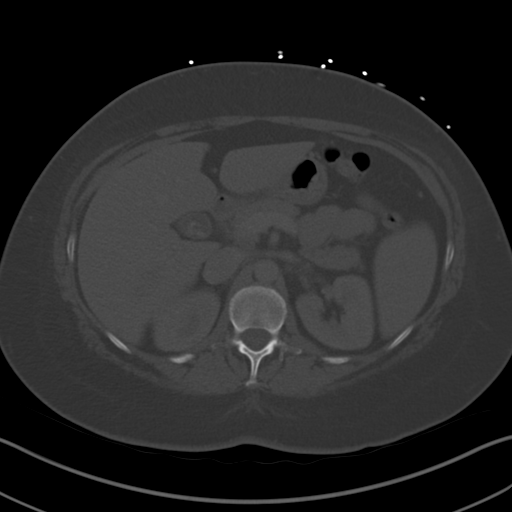
[im 75/91  soft-tissue]
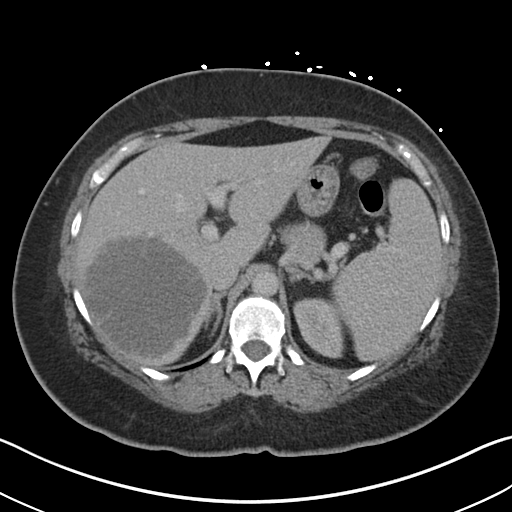
[im 85/91  soft-tissue]
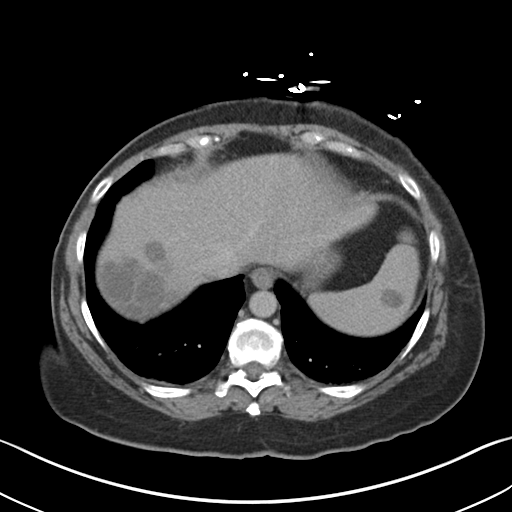

[Series 4: coronal st · coronal · 0.78mm/px · 3 of 138 slices shown]
[im 46/138  soft-tissue]
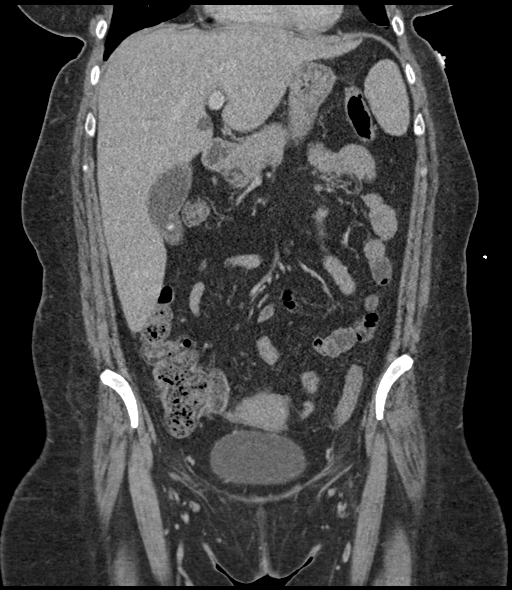
[im 61/138  soft-tissue]
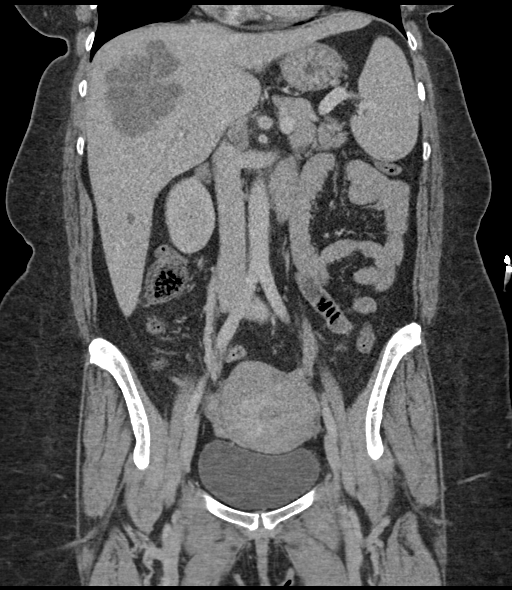
[im 77/138  soft-tissue]
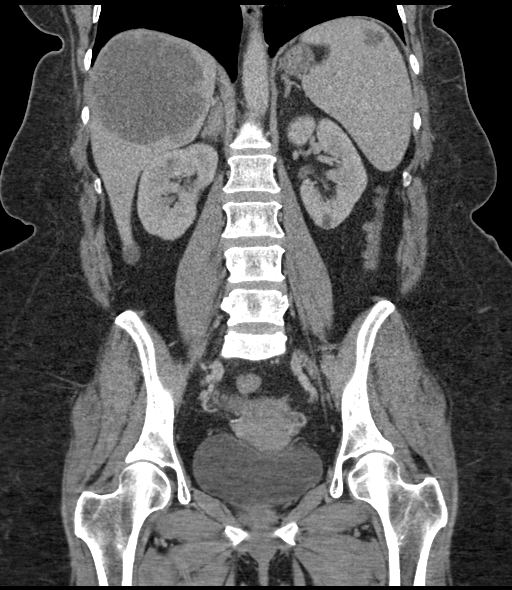

[14 of 46 positions shown; findings below may reference images not displayed]

RADIATION DOSE REDUCTION: This exam was performed according to the
departmental dose-optimization program which includes automated
exposure control, adjustment of the mA and/or kV according to
patient size and/or use of iterative reconstruction technique.

CONTRAST:  100mL OMNIPAQUE IOHEXOL 300 MG/ML  SOLN
FINDINGS: Lower chest: Unremarkable.

Hepatobiliary: Large mass in the right lobe of the liver again
noted, currently measuring 9.0 x 8.7 x 8.8 cm (axial image 16 of
series 2 and coronal image 74 of series 4). Other smaller
low-attenuation hepatic lesions are noted, largest of which are
compatible with simple cysts, measuring up to 1.8 x 1.7 cm in
inferior aspect of segment 6 (axial image 40 of series 2). Still
other subcentimeter low-attenuation hepatic lesions are too small to
definitively characterize, but are similar to prior studies and
previously characterized as cysts. No new hepatic lesions are
otherwise noted. No intra or extrahepatic biliary ductal dilatation.
Partially calcified gallstones are noted in the gallbladder, largest
of which measures up to 1.8 cm in diameter. Gallbladder is not
distended. No gallbladder wall thickening or pericholecystic fluid
or surrounding inflammatory changes.

Pancreas: No pancreatic mass. No pancreatic ductal dilatation. No
pancreatic or peripancreatic fluid collections or inflammatory
changes.

Spleen: Spleen is enlarged measuring 13.8 x 7.4 x 12.2 cm (estimated
splenic volume of 623 mL). Again noted is a well-defined 1.9 cm
low-attenuation lesion in the superior aspect of the spleen, which
is stable compared to prior studies, previously not hypermetabolic
on prior PET-CT and characterized as a cyst on prior abdominal MRI.

Adrenals/Urinary Tract: Subcentimeter low-attenuation lesions in
both kidneys, too small to characterize, but statistically likely to
represent tiny cysts. No hydroureteronephrosis. Urinary bladder is
normal in appearance. Bilateral adrenal glands are normal in
appearance.

Stomach/Bowel: The appearance of the stomach is normal. There is no
pathologic dilatation of small bowel or colon. Previously identified
hypervascular lesion in the ascending colon currently measures
approximately 2.9 x 2.4 cm (axial image 50 of series 2) with
surrounding infiltrative changes in the mesocolic fat. Normal
appendix.

Vascular/Lymphatic: Atherosclerotic calcifications in the pelvic
vasculature. Multiple prominent ileocolic lymph nodes are noted
measuring up to 9 mm in short axis. Additionally, an enlarged
aortocaval lymph node measuring 1.6 cm in short axis is noted (axial
image 41 of series 2), slightly larger than prior PET-CT.
Nonenlarged portacaval lymph node measuring 10 mm in short axis
(axial image 21 of series 2), similar to prior PET-CT at which time
this lymph node was hypermetabolic. No other new lymphadenopathy
noted elsewhere in the abdomen or pelvis.

Reproductive: Infiltrative appearing heterogeneously enhancing
lesion in the fundal portion of the endometrium extending into the
right-side of the uterine fundus (axial image 65 of series 2 and
sagittal image 86 of series 5) measuring 3.8 x 4.7 x 3.6 cm. Ovaries
are unremarkable in appearance.

Other: No significant volume of ascites.  No pneumoperitoneum.

Musculoskeletal: There are no aggressive appearing lytic or blastic
lesions noted in the visualized portions of the skeleton.
IMPRESSION: 1. Again noted is a small primary lesion in the ascending colon with
some adjacent lymphadenopathy, metastatic lymph nodes in the
retroperitoneum and upper abdomen, and large solitary hepatic
metastasis, as detailed above. The hepatic mass is slightly smaller
than prior examination, but the other findings appear relatively
similar to the prior examination. No new sites of metastatic disease
are noted elsewhere in the abdomen or pelvis on today's examination.
2. Heterogeneously enhancing infiltrative appearing lesion in the
endometrial canal extending into the right-side of the fundus,
concerning for primary endometrial neoplasm. This has enlarged
compared to prior contrast enhanced MRI of the pelvis dated
[DATE].
[DATE]. Additional incidental findings, as above.

## 2021-06-20 MED ORDER — IOHEXOL 300 MG/ML  SOLN
100.0000 mL | Freq: Once | INTRAMUSCULAR | Status: AC | PRN
  Administered 2021-06-20: 05:00:00 100 mL via INTRAVENOUS

## 2021-06-20 MED ORDER — FENTANYL CITRATE PF 50 MCG/ML IJ SOSY
100.0000 ug | PREFILLED_SYRINGE | Freq: Once | INTRAMUSCULAR | Status: AC
  Administered 2021-06-20: 02:00:00 100 ug via INTRAVENOUS
  Filled 2021-06-20: qty 2

## 2021-06-20 MED ORDER — SODIUM CHLORIDE (PF) 0.9 % IJ SOLN
INTRAMUSCULAR | Status: AC
  Filled 2021-06-20: qty 50

## 2021-06-20 MED ORDER — ONDANSETRON HCL 4 MG/2ML IJ SOLN
4.0000 mg | Freq: Once | INTRAMUSCULAR | Status: AC
Start: 2021-06-20 — End: 2021-06-20
  Administered 2021-06-20: 02:00:00 4 mg via INTRAVENOUS
  Filled 2021-06-20: qty 2

## 2021-06-20 NOTE — Discharge Instructions (Signed)
° °  SEEK MEDICAL CARE IF: Your temperature stays around 100F or if you have repeated vomiting, unable to take fluids, breathing problems, a severe headache, confusion, a new rash, abdominal pain, difficulty urinating, become poorly responsive, or any new symptoms.

## 2021-06-20 NOTE — ED Provider Notes (Signed)
[facility] DEPT Provider Note   CSN: [id number] Arrival date & time: 06/19/21  2212     History  Chief Complaint  Patient presents with   Fever   Nausea    Rebecca Bullock is a 56 y.o. female.  The history is provided by the patient and a relative.  Weakness Severity:  Moderate Onset quality:  Gradual Timing:  Constant Progression:  Unchanged Chronicity:  New Relieved by:  Nothing Worsened by:  Nothing Associated symptoms: abdominal pain, cough, falls, fever and headaches   Associated symptoms: no loss of consciousness and no vomiting   Patient with history of metastatic colon cancer presents with multiple complaints.  Patient reports that she underwent new chemo treatment earlier in the day.  At approximately 3 PM, she reported having headache, fever up to 102 and chills.  She also reports nausea without vomiting.  She also reports abdominal cramping.  She has had normal bowel movements.  She also reports incidental fall walking up steps earlier in the day and did sustain bruise to her lower back.  No head injury or other traumatic injury reported    Home Medications Prior to Admission medications   Medication Sig Start Date End Date Taking? Authorizing Provider  acetaminophen (TYLENOL) 500 MG tablet Take 500-1,000 mg by mouth daily as needed for moderate pain or headache.   Yes [provider]  betamethasone valerate (VALISONE) 0.1 % cream Apply 1 application topically See admin instructions. Apply 1 application topically every other day as needed for psoriasis flare 11/11/20  Yes [provider]  encorafenib (BRAFTOVI) 75 MG capsule Take 4 capsules (300 mg total) by mouth daily. 06/01/21  Yes [name], MD  omeprazole (PRILOSEC OTC) 20 MG tablet Take 20 mg by mouth daily as needed (acid reflux).   Yes [provider]  albuterol (VENTOLIN HFA) 108 (90 Base) MCG/ACT inhaler Inhale 2 puffs into the lungs every 6 (six)  hours as needed. Patient not taking: Reported on 06/20/2021 03/13/21   Donella Stade, PA-C  estradiol (ESTRACE) 0.1 MG/GM vaginal cream Place 1 Applicatorful vaginally daily as needed (dryness / irritation). Patient not taking: Reported on 06/20/2021 11/11/20   [provider]  fluticasone (FLONASE) 50 MCG/ACT nasal spray Place 2 sprays into both nostrils daily. Patient not taking: Reported on 05/09/2021 09/18/16   Emeterio Reeve, DO  folic acid (FOLVITE) 1 MG tablet Take 1 tablet (1 mg total) by mouth daily. Patient not taking: Reported on 05/09/2021 03/15/21 06/23/21  Pokhrel, Corrie Mckusick, MD  montelukast (SINGULAIR) 10 MG tablet Take 1 tablet (10 mg total) by mouth at bedtime. Patient not taking: Reported on 06/20/2021 03/13/21   Donella Stade, PA-C  Multiple Vitamins-Minerals (MULTIVITAMIN WITH MINERALS) tablet Take 1 tablet by mouth daily. Patient not taking: Reported on 05/09/2021 03/15/21 06/23/21  Pokhrel, Corrie Mckusick, MD  vitamin B-12 (CYANOCOBALAMIN) 1000 MCG tablet Take 1 tablet (1,000 mcg total) by mouth daily. Patient not taking: Reported on 06/20/2021 03/15/21 06/23/21  Flora Lipps, MD  prochlorperazine (COMPAZINE) 10 MG tablet Take 1 tablet (10 mg total) by mouth every 6 (six) hours as needed (Nausea or vomiting). 05/08/21 05/31/21  [name], MD      Allergies    Irinotecan, Metronidazole, and Vancomycin    Review of Systems   Review of Systems  Constitutional:  Positive for chills and fever.  Respiratory:  Positive for cough.   Gastrointestinal:  Positive for abdominal pain. Negative for vomiting.  Musculoskeletal:  Positive for  falls.  Skin:  Positive for color change.  Neurological:  Positive for weakness and headaches. Negative for loss of consciousness.  All other systems reviewed and are negative.  Physical Exam Updated Vital Signs BP (!) 152/86    Pulse 76    Temp 98.1 F (36.7 C)    Resp 19    Ht 1.549 m (5\' 1" )    Wt 81.6 kg    SpO2 97%    BMI 34.01  kg/m  Physical Exam CONSTITUTIONAL: Chronically ill-appearing HEAD: Normocephalic/atraumatic EYES: EOMI/PERRL ENMT: Mucous membranes dry, no erythema to oropharynx NECK: supple no meningeal signs SPINE/BACK:entire spine nontender, no bruising/crepitance/stepoffs noted to spine CV: S1/S2 noted, tachycardic LUNGS: Lungs are clear to auscultation bilaterally, no apparent distress ABDOMEN: soft, nontender, no rebound or guarding, bowel sounds noted throughout abdomen GU:no cva tenderness NEURO: Pt is awake/alert/appropriate, moves all extremitiesx4.  No facial droop.   EXTREMITIES: pulses normal/equal, full ROM SKIN: warm, color normal, minimal bruising noted to left lower flank. PSYCH: no abnormalities of mood noted, alert and oriented to situation  ED Results / Procedures / Treatments   Labs (all labs ordered are listed, but only abnormal results are displayed) Labs Reviewed  COMPREHENSIVE METABOLIC PANEL - Abnormal; Notable for the following components:      Result Value   Glucose, Bld 140 (*)    BUN 5 (*)    Calcium 8.8 (*)    All other components within normal limits  CBC WITH DIFFERENTIAL/PLATELET - Abnormal; Notable for the following components:   WBC 3.9 (*)    Hemoglobin 11.5 (*)    MCV 77.4 (*)    MCH 23.2 (*)    RDW 20.9 (*)    Lymphs Abs 0.4 (*)    Monocytes Absolute 1.1 (*)    All other components within normal limits  URINALYSIS, ROUTINE W REFLEX MICROSCOPIC - Abnormal; Notable for the following components:   APPearance HAZY (*)    Hgb urine dipstick SMALL (*)    Leukocytes,Ua TRACE (*)    All other components within normal limits  RESP PANEL BY RT-PCR (FLU A&B, COVID) ARPGX2  CULTURE, BLOOD (ROUTINE X 2)  CULTURE, BLOOD (ROUTINE X 2)  URINE CULTURE  LACTIC ACID, PLASMA  PROTIME-INR  APTT    EKG EKG Interpretation  Date/Time:  Tuesday June 20 2021 00:10:13 EST Ventricular Rate:  86 PR Interval:  142 QRS Duration: 82 QT Interval:  394 QTC  Calculation: 472 R Axis:   61 Text Interpretation: Sinus rhythm Confirmed by Ripley Fraise [phone]) on 06/20/2021 1:28:39 AM  Radiology CT ABDOMEN PELVIS W CONTRAST  Result Date: 06/20/2021 CLINICAL DATA:  56 year old female with history of acute onset of nonlocalized abdominal pain. Fever. Nausea. History of colon cancer with known liver metastases. EXAM: CT ABDOMEN AND PELVIS WITH CONTRAST TECHNIQUE: Multidetector CT imaging of the abdomen and pelvis was performed using the standard protocol following bolus administration of intravenous contrast. RADIATION DOSE REDUCTION: This exam was performed according to the departmental dose-optimization program which includes automated exposure control, adjustment of the mA and/or kV according to patient size and/or use of iterative reconstruction technique. CONTRAST:  125mL OMNIPAQUE IOHEXOL 300 MG/ML  SOLN COMPARISON:  PET-CT 05/02/2021.  Abdominal MRI 05/01/2021. FINDINGS: Lower chest: Unremarkable. Hepatobiliary: Large mass in the right lobe of the liver again noted, currently measuring 9.0 x 8.7 x 8.8 cm (axial image 16 of series 2 and coronal image 74 of series 4). Other smaller low-attenuation hepatic lesions are noted, largest of which are  compatible with simple cysts, measuring up to 1.8 x 1.7 cm in inferior aspect of segment 6 (axial image 40 of series 2). Still other subcentimeter low-attenuation hepatic lesions are too small to definitively characterize, but are similar to prior studies and previously characterized as cysts. No new hepatic lesions are otherwise noted. No intra or extrahepatic biliary ductal dilatation. Partially calcified gallstones are noted in the gallbladder, largest of which measures up to 1.8 cm in diameter. Gallbladder is not distended. No gallbladder wall thickening or pericholecystic fluid or surrounding inflammatory changes. Pancreas: No pancreatic mass. No pancreatic ductal dilatation. No pancreatic or peripancreatic fluid  collections or inflammatory changes. Spleen: Spleen is enlarged measuring 13.8 x 7.4 x 12.2 cm (estimated splenic volume of 623 mL). Again noted is a well-defined 1.9 cm low-attenuation lesion in the superior aspect of the spleen, which is stable compared to prior studies, previously not hypermetabolic on prior PET-CT and characterized as a cyst on prior abdominal MRI. Adrenals/Urinary Tract: Subcentimeter low-attenuation lesions in both kidneys, too small to characterize, but statistically likely to represent tiny cysts. No hydroureteronephrosis. Urinary bladder is normal in appearance. Bilateral adrenal glands are normal in appearance. Stomach/Bowel: The appearance of the stomach is normal. There is no pathologic dilatation of small bowel or colon. Previously identified hypervascular lesion in the ascending colon currently measures approximately 2.9 x 2.4 cm (axial image 50 of series 2) with surrounding infiltrative changes in the mesocolic fat. Normal appendix. Vascular/Lymphatic: Atherosclerotic calcifications in the pelvic vasculature. Multiple prominent ileocolic lymph nodes are noted measuring up to 9 mm in short axis. Additionally, an enlarged aortocaval lymph node measuring 1.6 cm in short axis is noted (axial image 41 of series 2), slightly larger than prior PET-CT. Nonenlarged portacaval lymph node measuring 10 mm in short axis (axial image 21 of series 2), similar to prior PET-CT at which time this lymph node was hypermetabolic. No other new lymphadenopathy noted elsewhere in the abdomen or pelvis. Reproductive: Infiltrative appearing heterogeneously enhancing lesion in the fundal portion of the endometrium extending into the right-side of the uterine fundus (axial image 65 of series 2 and sagittal image 86 of series 5) measuring 3.8 x 4.7 x 3.6 cm. Ovaries are unremarkable in appearance. Other: No significant volume of ascites.  No pneumoperitoneum. Musculoskeletal: There are no aggressive appearing  lytic or blastic lesions noted in the visualized portions of the skeleton. IMPRESSION: 1. Again noted is a small primary lesion in the ascending colon with some adjacent lymphadenopathy, metastatic lymph nodes in the retroperitoneum and upper abdomen, and large solitary hepatic metastasis, as detailed above. The hepatic mass is slightly smaller than prior examination, but the other findings appear relatively similar to the prior examination. No new sites of metastatic disease are noted elsewhere in the abdomen or pelvis on today's examination. 2. Heterogeneously enhancing infiltrative appearing lesion in the endometrial canal extending into the right-side of the fundus, concerning for primary endometrial neoplasm. This has enlarged compared to prior contrast enhanced MRI of the pelvis dated 04/24/2021. 3. Additional incidental findings, as above. Electronically Signed   By: [name] M.D.   On: 06/20/2021 05:34   DG Chest Port 1 View  Result Date: 06/19/2021 CLINICAL DATA:  Sepsis EXAM: PORTABLE CHEST 1 VIEW COMPARISON:  03/14/2021 FINDINGS: Lungs are well expanded, symmetric, and clear. No pneumothorax or pleural effusion. Cardiac size within normal limits. Right internal jugular chest port has been placed in the interval with its tip seen within the superior vena cava. Pulmonary vascularity is  normal. Osseous structures are age-appropriate. No acute bone abnormality. IMPRESSION: No active disease. Interval right chest port placement, tip within the SVC. Electronically Signed   By: Fidela Salisbury M.D.   On: 06/19/2021 23:27    Procedures Procedures    Medications Ordered in ED Medications  lactated ringers infusion ( Intravenous New Bag/Given 06/20/21 0306)  lactated ringers bolus 1,000 mL (0 mLs Intravenous Stopped 06/20/21 0219)    And  lactated ringers bolus 1,000 mL (0 mLs Intravenous Stopped 06/20/21 0051)    And  lactated ringers bolus 500 mL (0 mLs Intravenous Stopped 06/20/21 0257)   ceFEPIme (MAXIPIME) 2 g in sodium chloride 0.9 % 100 mL IVPB (0 g Intravenous Stopped 06/20/21 0051)  vancomycin (VANCOCIN) IVPB 1000 mg/200 mL premix (0 mg Intravenous Stopped 06/20/21 0140)  fentaNYL (SUBLIMAZE) injection 100 mcg (100 mcg Intravenous Given 06/20/21 0214)  ondansetron (ZOFRAN) injection 4 mg (4 mg Intravenous Given 06/20/21 0214)  sodium chloride (PF) 0.9 % injection (  Given by Other 06/20/21 0420)  iohexol (OMNIPAQUE) 300 MG/ML solution 100 mL (100 mLs Intravenous Contrast Given 06/20/21 0459)    ED Course/ Medical Decision Making/ A&P Clinical Course as of 06/20/21 0623  Tue Jun 20, 2021  0018 Glucose(!): 140 Mild hyperglycemia [DW]  0042 Patient is improving, blood pressure is improving and no abdominal pain.  She reports her headache is improved [DW]  0129 Patient with rash after receiving vancomycin.  No other signs of anaphylaxis.  She is stable.  We will continue to monitor [DW]  0218 Patient now having worsening abdominal pain, as well as diarrhea.  She has rash to her face and upper neck, but no other rash in her body.  No angioedema difficulty breathing or swallowing Will proceed with CT abdomen pelvis [DW]  0328 Patient improved awaiting CT imaging [DW]  0615 D/w Dr Chryl Heck with oncology We have reviewed imaging and labs.  Her symptoms are likely due to the oral chemo drug.  If patient is improved she can be discharged with close follow-up with Dr. [name] [DW]  [phone] Patient feels improved.  Her rash is improving.  No acute distress.  She feels comfortable for discharge home.  She will follow with Dr. [name] later today and hold her chemo drug for now. she reports she last took the drug at 9 AM yesterday.  We discussed strict ER return precautions. [DW]    Clinical Course User Index [DW] Ripley Fraise, MD                           Medical Decision Making Amount and/or Complexity of Data Reviewed Labs: ordered. Decision-making details documented in ED  Course. Radiology: ordered. ECG/medicine tests: ordered.  Risk Prescription drug management.   This patient presents to the ED for concern of fever and headache, this involves an extensive number of treatment options, and is a complaint that carries with it a high risk of complications and morbidity.  The differential diagnosis includes meningitis, sepsis, neutropenic fever, UTI, pneumonia  Comorbidities that complicate the patient evaluation: Patients presentation is complicated by their history of colon cancer   Additional history obtained: Additional history obtained from family Records reviewed oncology notes  Lab Tests: I Ordered, and personally interpreted labs.  The pertinent results include: Hyperglycemia, mild leukopenia  Imaging Studies ordered: I ordered imaging studies including X-ray chest I independently visualized and interpreted imaging which showed no acute finding I agree with the radiologist interpretation  Cardiac Monitoring: The patient was maintained on a cardiac monitor.  I personally viewed and interpreted the cardiac monitor which showed an underlying rhythm of:  sinus rhythm  Medicines ordered and prescription drug management: I ordered medication including IV fluids and antibiotics for possible sepsis Reevaluation of the patient after these medicines showed that the patient    improved  Critical Interventions:       IV fluids and antibiotics  Consultations Obtained: I requested consultation with the consultant oncology, and discussed  findings as well as pertinent plan - they recommend: Close follow-up as an outpatient as this medicine likely caused her febrile illness  Reevaluation: After the interventions noted above, I reevaluated the patient and found that they have :improved  Complexity of problems addressed: Patients presentation is most consistent with  acute complicated illness/injury requiring diagnostic workup       Disposition: After consideration of the diagnostic results and the patients response to treatment,  I feel that the patent would benefit from discharge .            Final Clinical Impression(s) / ED Diagnoses Final diagnoses:  Acute febrile illness  Diarrhea, unspecified type  Generalized abdominal pain    Rx / DC Orders ED Discharge Orders     None         Ripley Fraise, MD 06/20/21 [phone]

## 2021-06-20 NOTE — ED Notes (Signed)
Upon Vanc completion pt's neck and face were red, pt states that her stomach hurts.

## 2021-06-20 NOTE — Progress Notes (Signed)
Patient seen at the ED yesterday for fever and headache after receiving her first doses of Braftovi and Vectibix. She was discharged early this morning.   Called patient and she is extremely fatigued this morning, and continues to have slight headaches. She was given a note to be out of work for two days and the ED told her to hold Braftovi.  Spoke with Dr [name]. He would like her to hold Braftovi for 3 days and then restart at a lower dose of 225mg  until she is seen in follow up on 06/29/2021.  Spoke with [facility] about fever management on Braftovi. She recommended APAP and ibuprofen. If her fever persists, low dose steroids can be considered if she continues having fevers beyond 3 days (this reaction is most common within the first month of treatment).  Called patient and reviewed medication instructions with patient. Confirmed with teach back.  Oncology Nurse Navigator Documentation  Oncology Nurse Navigator Flowsheets 06/20/2021  Abnormal Finding Date -  Confirmed Diagnosis Date -  Diagnosis Status -  Phase of Treatment -  Chemotherapy Pending- Reason: -  Chemotherapy Actual Start Date: -  Navigator Follow Up Date: 06/29/2021  Navigator Follow Up Reason: Follow-up Appointment;Chemotherapy  Navigator Restaurant manager, [employer] food Encounter Type Telephone;Appt/Treatment Plan Review  Telephone Patient Update;Education;Outgoing Call  Treatment Initiated Date -  Patient Visit Type MedOnc  Treatment Phase Active Tx  Barriers/Navigation Needs Coordination of Care;Education  Education Other  Interventions Education;Psycho-Social Support  Acuity Level 2-Minimal Needs (1-2 Barriers Identified)  Referrals -  Coordination of Care -  Education Method Verbal;Teach-back  Support Groups/Services Friends and Family  Time Spent with Patient 30

## 2021-06-20 NOTE — ED Notes (Addendum)
Pt is throwing up and a couple of pink spots of blood was seen.

## 2021-06-21 ENCOUNTER — Telehealth: Payer: Self-pay | Admitting: General Practice

## 2021-06-21 LAB — URINE CULTURE: Culture: <10000 — AB

## 2021-06-21 NOTE — Telephone Encounter (Signed)
Transition Care Management Follow-up Telephone Call Date of discharge and from where: 06/20/21 from [facility] How have you been since you were released from the hospital? Doing ok.  Any questions or concerns? No  Items Reviewed: Did the pt receive and understand the discharge instructions provided? Yes  Medications obtained and verified? Yes  Other? No  Any new allergies since your discharge? No  Dietary orders reviewed? Yes Do you have support at home? Yes   Home Care and Equipment/Supplies: Were home health services ordered? no  Functional Questionnaire: (I = Independent and D = Dependent) ADLs: I  Bathing/Dressing- I  Meal Prep- I  Eating- I  Maintaining continence- I  Transferring/Ambulation- I  Managing Meds- I  Follow up appointments reviewed:  PCP Hospital f/u appt confirmed? No   Specialist Hospital f/u appt confirmed? Yes  Scheduled to see Dr. [name] on 06/29/21. Are transportation arrangements needed? No  If their condition worsens, is the pt aware to call PCP or go to the Emergency Dept.? Yes Was the patient provided with contact information for the PCP's office or ED? Yes Was to pt encouraged to call back with questions or concerns? Yes

## 2021-06-23 ENCOUNTER — Encounter: Payer: Self-pay | Admitting: *Deleted

## 2021-06-23 NOTE — Progress Notes (Signed)
Patient calling to confirm that she should restart her Braftovi this morning. Instructed patient to start today, only taking three pills instead of four. Reviewed side effects including fevers. Explained that if over the weekend she developed fevers, but no other symptoms, to manage at home with tylenol and/or ibuprofen. Reminded her of the oncall service if she had questions or concerns.   She mentions a runny nose and hoarseness. Per an Internet search she found that Braftovi can cause hoarseness. Told her that since she has only taken one dose of Braftovi, and that she also has a runny nose, this is more likely a symptom of illness, but to monitor the symptom and let us know if it worsens or she develops other symptoms.   She says overall she feels much better on this regimen than the last. She does have a mild rash to her face and fatigue but otherwise is doing okay.   Oncology Nurse Navigator Documentation  Oncology Nurse Navigator Flowsheets 06/23/2021  Abnormal Finding Date -  Confirmed Diagnosis Date -  Diagnosis Status -  Phase of Treatment -  Chemotherapy Pending- Reason: -  Chemotherapy Actual Start Date: -  Navigator Follow Up Date: 06/26/2021  Navigator Follow Up Reason: Patient Call  Navigator Location [facility]  Navigator Encounter Type Telephone  Telephone Education;Medication Assistance;Incoming Call  Treatment Initiated Date -  Patient Visit Type MedOnc  Treatment Phase Active Tx  Barriers/Navigation Needs Coordination of Care;Education  Education Pain/ Symptom Management  Interventions Education;Medication Assistance;Psycho-Social Support  Acuity Level 2-Minimal Needs (1-2 Barriers Identified)  Referrals -  Coordination of Care -  Education Method Verbal;Teach-back  Support Groups/Services Friends and Family  Time Spent with Patient 30

## 2021-06-25 LAB — CULTURE, BLOOD (ROUTINE X 2)
Culture: NO GROWTH
Culture: NO GROWTH
Special Requests: ADEQUATE
Special Requests: ADEQUATE

## 2021-06-26 ENCOUNTER — Encounter: Payer: Self-pay | Admitting: Family

## 2021-06-26 ENCOUNTER — Encounter: Payer: Self-pay | Admitting: Hematology & Oncology

## 2021-06-26 ENCOUNTER — Encounter: Payer: Self-pay | Admitting: *Deleted

## 2021-06-26 ENCOUNTER — Other Ambulatory Visit (HOSPITAL_BASED_OUTPATIENT_CLINIC_OR_DEPARTMENT_OTHER): Payer: Self-pay

## 2021-06-26 DIAGNOSIS — R21 Rash and other nonspecific skin eruption: Secondary | ICD-10-CM

## 2021-06-26 MED ORDER — DOXYCYCLINE HYCLATE 100 MG PO TABS
100.0000 mg | ORAL_TABLET | Freq: Every day | ORAL | 0 refills | Status: DC
  Filled 2021-06-26: qty 28, 28d supply, fill #0

## 2021-06-26 MED ORDER — HYDROCORTISONE 1 % EX LOTN
1.0000 "application " | TOPICAL_LOTION | Freq: Two times a day (BID) | CUTANEOUS | 3 refills | Status: DC | PRN
  Filled 2021-06-26: qty 120, 12d supply, fill #0

## 2021-06-26 NOTE — Progress Notes (Signed)
Called patient to see how she felt over the weekend after restarting Braftovi on Friday.   She states she had not problems with fevers. She continues to have a mild headache. She has started to have some diarrhea. She has imodium at home, and I instructed her to take it as previously directed.   Her biggest issue is the vectibix rash. It's on her face, and upper torso, specifically around her breasts and bra line. She states its painful and itchy.   Spoke to Dr [name] and our pharmacist, Theone Murdoch. Recommended protocol is doxycycline 100mg  daily for [redacted] weeks along with hydrocortisone cream to the rash. Dr [name] is in agreement with this.   Oncology Nurse Navigator Documentation  Oncology Nurse Navigator Flowsheets 06/26/2021  Abnormal Finding Date -  Confirmed Diagnosis Date -  Diagnosis Status -  Phase of Treatment -  Chemotherapy Pending- Reason: -  Chemotherapy Actual Start Date: -  Navigator Follow Up Date: 06/29/2021  Navigator Follow Up Reason: Follow-up Appointment;Chemotherapy  Navigator Restaurant manager, [employer] food Encounter Type Treatment  Telephone Patient Update;Outgoing Call  Treatment Initiated Date -  Patient Visit Type MedOnc  Treatment Phase Active Tx  Barriers/Navigation Needs Coordination of Care;Education  Education Pain/ Symptom Management  Interventions Education;Medication Assistance;Psycho-Social Support  Acuity Level 2-Minimal Needs (1-2 Barriers Identified)  Referrals -  Coordination of Care -  Education Method Verbal;Teach-back  Support Groups/Services Friends and Family  Time Spent with Patient 30

## 2021-06-27 ENCOUNTER — Other Ambulatory Visit (HOSPITAL_BASED_OUTPATIENT_CLINIC_OR_DEPARTMENT_OTHER): Payer: Self-pay

## 2021-06-29 ENCOUNTER — Inpatient Hospital Stay: Payer: 59

## 2021-06-29 ENCOUNTER — Other Ambulatory Visit: Payer: 59

## 2021-06-29 ENCOUNTER — Ambulatory Visit: Payer: 59

## 2021-06-29 ENCOUNTER — Ambulatory Visit: Payer: 59 | Admitting: Hematology & Oncology

## 2021-06-30 ENCOUNTER — Other Ambulatory Visit: Payer: Self-pay

## 2021-06-30 ENCOUNTER — Encounter: Payer: Self-pay | Admitting: *Deleted

## 2021-06-30 ENCOUNTER — Encounter: Payer: Self-pay | Admitting: Family

## 2021-06-30 ENCOUNTER — Inpatient Hospital Stay: Payer: 59

## 2021-06-30 ENCOUNTER — Inpatient Hospital Stay (HOSPITAL_BASED_OUTPATIENT_CLINIC_OR_DEPARTMENT_OTHER): Payer: 59 | Admitting: Family

## 2021-06-30 ENCOUNTER — Inpatient Hospital Stay: Payer: 59 | Attending: Hematology & Oncology

## 2021-06-30 VITALS — BP 132/78 | HR 72 | Resp 16

## 2021-06-30 VITALS — BP 141/58 | HR 71 | Temp 98.4°F | Ht 61.75 in | Wt 181.0 lb

## 2021-06-30 DIAGNOSIS — R21 Rash and other nonspecific skin eruption: Secondary | ICD-10-CM

## 2021-06-30 DIAGNOSIS — C189 Malignant neoplasm of colon, unspecified: Secondary | ICD-10-CM

## 2021-06-30 DIAGNOSIS — C19 Malignant neoplasm of rectosigmoid junction: Secondary | ICD-10-CM | POA: Diagnosis present

## 2021-06-30 DIAGNOSIS — Z5112 Encounter for antineoplastic immunotherapy: Secondary | ICD-10-CM | POA: Diagnosis not present

## 2021-06-30 DIAGNOSIS — C787 Secondary malignant neoplasm of liver and intrahepatic bile duct: Secondary | ICD-10-CM | POA: Insufficient documentation

## 2021-06-30 DIAGNOSIS — D509 Iron deficiency anemia, unspecified: Secondary | ICD-10-CM | POA: Diagnosis not present

## 2021-06-30 LAB — CMP (CANCER CENTER ONLY)
ALT: 7 U/L (ref 0–44)
AST: 10 U/L — ABNORMAL LOW (ref 15–41)
Albumin: 3.7 g/dL (ref 3.5–5.0)
Alkaline Phosphatase: 109 U/L (ref 38–126)
Anion gap: 7 (ref 5–15)
BUN: 6 mg/dL (ref 6–20)
CO2: 27 mmol/L (ref 22–32)
Calcium: 9.5 mg/dL (ref 8.9–10.3)
Chloride: 103 mmol/L (ref 98–111)
Creatinine: 0.55 mg/dL (ref 0.44–1.00)
GFR, Estimated: >60 mL/min (ref >60)
Glucose, Bld: 127 mg/dL — ABNORMAL HIGH (ref 70–99)
Potassium: 3.3 mmol/L — ABNORMAL LOW (ref 3.5–5.1)
Sodium: 137 mmol/L (ref 135–145)
Total Bilirubin: 0.2 mg/dL — ABNORMAL LOW (ref 0.3–1.2)
Total Protein: 6.6 g/dL (ref 6.5–8.1)

## 2021-06-30 LAB — LACTATE DEHYDROGENASE: LDH: 153 U/L (ref 98–192)

## 2021-06-30 LAB — CBC WITH DIFFERENTIAL (CANCER CENTER ONLY)
Abs Immature Granulocytes: 0.03 10*3/uL (ref 0.00–0.07)
Basophils Absolute: 0 10*3/uL (ref 0.0–0.1)
Basophils Relative: 1 %
Eosinophils Absolute: 0.1 10*3/uL (ref 0.0–0.5)
Eosinophils Relative: 2 %
HCT: 33.8 % — ABNORMAL LOW (ref 36.0–46.0)
Hemoglobin: 10.2 g/dL — ABNORMAL LOW (ref 12.0–15.0)
Immature Granulocytes: 1 %
Lymphocytes Relative: 17 %
Lymphs Abs: 1.1 10*3/uL (ref 0.7–4.0)
MCH: 23.1 pg — ABNORMAL LOW (ref 26.0–34.0)
MCHC: 30.2 g/dL (ref 30.0–36.0)
MCV: 76.5 fL — ABNORMAL LOW (ref 80.0–100.0)
Monocytes Absolute: 0.7 10*3/uL (ref 0.1–1.0)
Monocytes Relative: 12 %
Neutro Abs: 4.1 10*3/uL (ref 1.7–7.7)
Neutrophils Relative %: 67 %
Platelet Count: 265 10*3/uL (ref 150–400)
RBC: 4.42 MIL/uL (ref 3.87–5.11)
RDW: 19.3 % — ABNORMAL HIGH (ref 11.5–15.5)
WBC Count: 6.1 10*3/uL (ref 4.0–10.5)
nRBC: 0 % (ref 0.0–0.2)

## 2021-06-30 LAB — IRON AND IRON BINDING CAPACITY (CC-WL,HP ONLY)
Iron: 27 ug/dL — ABNORMAL LOW (ref 28–170)
Saturation Ratios: 8 % — ABNORMAL LOW (ref 10.4–31.8)
TIBC: 339 ug/dL (ref 250–450)
UIBC: 312 ug/dL (ref 148–442)

## 2021-06-30 MED ORDER — SODIUM CHLORIDE 0.9 % IV SOLN
6.0000 mg/kg | Freq: Once | INTRAVENOUS | Status: AC
  Administered 2021-06-30: 500 mg via INTRAVENOUS
  Filled 2021-06-30: qty 20

## 2021-06-30 MED ORDER — ALTEPLASE 2 MG IJ SOLR: 2.0000 mg | Freq: Once | INTRAMUSCULAR | Status: DC | PRN

## 2021-06-30 MED ORDER — HEPARIN SOD (PORK) LOCK FLUSH 100 UNIT/ML IV SOLN
500.0000 [IU] | Freq: Once | INTRAVENOUS | Status: AC | PRN
  Administered 2021-06-30: 500 [IU]

## 2021-06-30 MED ORDER — SODIUM CHLORIDE 0.9 % IV SOLN: Freq: Once | INTRAVENOUS | Status: AC

## 2021-06-30 MED ORDER — SODIUM CHLORIDE 0.9% FLUSH
10.0000 mL | INTRAVENOUS | Status: DC | PRN
  Administered 2021-06-30: 10 mL

## 2021-06-30 NOTE — Progress Notes (Signed)
Visited with patient in infusion. She is doing well. Her rash is still present, but she states the cream is effective in reducing symptoms and she feels much better. She denies fevers. She is ready for her next cycle.   Oncology Nurse Navigator Documentation  Oncology Nurse Navigator Flowsheets 06/30/2021  Abnormal Finding Date -  Confirmed Diagnosis Date -  Diagnosis Status -  Phase of Treatment -  Chemotherapy Pending- Reason: -  Chemotherapy Actual Start Date: -  Navigator Follow Up Date: 07/12/2021  Navigator Follow Up Reason: Follow-up Appointment;Chemotherapy  Navigator Restaurant manager, fast food Encounter Type Treatment  Telephone -  Treatment Initiated Date -  Patient Visit Type MedOnc  Treatment Phase Active Tx  Barriers/Navigation Needs Coordination of Care;Education  Education -  Interventions Psycho-Social Support  Acuity Level 2-Minimal Needs (1-2 Barriers Identified)  Referrals -  Coordination of Care -  Education Method -  Support Groups/Services Friends and Family  Time Spent with Patient 30

## 2021-06-30 NOTE — Patient Instructions (Signed)
[facility]  Discharge Instructions: Thank you for choosing [facility] to provide your oncology and hematology care.   If you have a lab appointment with the [facility], please go directly to the [facility] and check in at the registration area.  Wear comfortable clothing and clothing appropriate for easy access to any Portacath or PICC line.   We strive to give you quality time with your provider. You may need to reschedule your appointment if you arrive late (15 or more minutes).  Arriving late affects you and other patients whose appointments are after yours.  Also, if you miss three or more appointments without notifying the office, you may be dismissed from the clinic at the providers discretion.      For prescription refill requests, have your pharmacy contact our office and allow 72 hours for refills to be completed.    Today you received the following chemotherapy and/or immunotherapy agents panitumumab       To help prevent nausea and vomiting after your treatment, we encourage you to take your nausea medication as directed.  BELOW ARE SYMPTOMS THAT SHOULD BE REPORTED IMMEDIATELY: *FEVER GREATER THAN 100.4 F (38 C) OR HIGHER *CHILLS OR SWEATING *NAUSEA AND VOMITING THAT IS NOT CONTROLLED WITH YOUR NAUSEA MEDICATION *UNUSUAL SHORTNESS OF BREATH *UNUSUAL BRUISING OR BLEEDING *URINARY PROBLEMS (pain or burning when urinating, or frequent urination) *BOWEL PROBLEMS (unusual diarrhea, constipation, pain near the anus) TENDERNESS IN MOUTH AND THROAT WITH OR WITHOUT PRESENCE OF ULCERS (sore throat, sores in mouth, or a toothache) UNUSUAL RASH, SWELLING OR PAIN  UNUSUAL VAGINAL DISCHARGE OR ITCHING   Items with * indicate a potential emergency and should be followed up as soon as possible or go to the Emergency Department if any problems should occur.  Please show the CHEMOTHERAPY ALERT CARD or IMMUNOTHERAPY ALERT CARD at check-in to  the Emergency Department and triage nurse. Should you have questions after your visit or need to cancel or reschedule your appointment, please contact [facility]  [phone] and follow the prompts.  Office hours are 8:00 a.m. to 4:30 p.m. Monday - Friday. Please note that voicemails left after 4:00 p.m. may not be returned until the following business day.  We are closed weekends and major holidays. You have access to a nurse at all times for urgent questions. Please call the main number to the clinic [phone] and follow the prompts.  For any non-urgent questions, you may also contact your provider using MyChart. We now offer e-Visits for anyone 78 and older to request care online for non-urgent symptoms. For details visit [email or web address].   Also download the MyChart app! Go to the app store, search "MyChart", open the app, select [facility], and log in with your MyChart username and password.  Due to Covid, a mask is required upon entering the hospital/clinic. If you do not have a mask, one will be given to you upon arrival. For doctor visits, patients may have 1 support person aged 33 or older with them. For treatment visits, patients cannot have anyone with them due to current Covid guidelines and our immunocompromised population.

## 2021-06-30 NOTE — Patient Instructions (Signed)

## 2021-06-30 NOTE — Progress Notes (Signed)
Hematology and Oncology Follow Up Visit  Johna Kearl [id number] 04-11-1966 56 y.o. 06/30/2021   Principle Diagnosis:  Metastatic colorectal cancer-liver metastasis- BRAF (+)   Current Therapy:        Status post cycle 1 of chemotherapy with FOLFOXIRI/Avastin Encorafenib/Vectibix -- started on 06/19/2021, s/p cycle 1   Interim History:  Ms. Dann is here today for follow-up. She is feeling much better. Her rash is still present but seems to be slowly improving and the itching is better when she uses the hydrocortisone cream. She did not start the Doxy because she was worried about side effects. She will start this is her rash worsens.  She states that she had a uterine ablation 4 years ago and up until starting treatment had not had a cycle. She is currently on her second cycle and has been on now for 2 weeks. She plans to contact her gynecologist Dr. Emogene Morgan. CEA last month was down to 3.6.  No fever, chill, n/v, cough, dizziness, SOB, chest pain, palpitations, abdominal pain or changes in bowel or bladder habits.  No swelling, tenderness, numbness or tingling in her extremities at this time.  No falls or syncope.  She states that she does not have much of an appetite but is still making herself eat. She is trying her best to stay well hydrated throughout the day. Her weight is stable at 181 lbs.  ECOG Performance Status: 1 - Symptomatic but completely ambulatory  Medications:  Allergies as of 06/30/2021       Reactions   Irinotecan Other (See Comments)   Flushing, tingling, visual disturbance   Metronidazole Diarrhea   GI Upset (intolerance)   Vancomycin Rash   ?red syndrome        Medication List        Accurate as of June 30, 2021 12:26 PM. If you have any questions, ask your nurse or doctor.          acetaminophen 500 MG tablet Commonly known as: TYLENOL Take 500-1,000 mg by mouth daily as needed for moderate pain or headache.   albuterol 108 (90 Base) MCG/ACT  inhaler Commonly known as: VENTOLIN HFA Inhale 2 puffs into the lungs every 6 (six) hours as needed.   betamethasone valerate 0.1 % cream Commonly known as: VALISONE Apply 1 application topically See admin instructions. Apply 1 application topically every other day as needed for psoriasis flare   doxycycline 100 MG tablet Commonly known as: VIBRA-TABS Take 1 tablet (100 mg total) by mouth daily.   encorafenib 75 MG capsule Commonly known as: BRAFTOVI Take 4 capsules (300 mg total) by mouth daily.   estradiol 0.1 MG/GM vaginal cream Commonly known as: ESTRACE Place 1 Applicatorful vaginally daily as needed (dryness / irritation).   fluticasone 50 MCG/ACT nasal spray Commonly known as: FLONASE Place 2 sprays into both nostrils daily.   hydrocortisone 1 % lotion Apply 1 application topically 2 (two) times daily as needed for itching.   montelukast 10 MG tablet Commonly known as: SINGULAIR Take 1 tablet (10 mg total) by mouth at bedtime.   omeprazole 20 MG tablet Commonly known as: PRILOSEC OTC Take 20 mg by mouth daily as needed (acid reflux).        Allergies:  Allergies  Allergen Reactions   Irinotecan Other (See Comments)    Flushing, tingling, visual disturbance   Metronidazole Diarrhea    GI Upset (intolerance)   Vancomycin Rash    ?red syndrome    Past Medical History, Surgical  history, Social history, and Family History were reviewed and updated.  Review of Systems: All other 10 point review of systems is negative.   Physical Exam:  weight is 181 lb (82.1 kg). Her oral temperature is 98.4 F (36.9 C). Her blood pressure is 141/58 (abnormal) and her pulse is 71. Her oxygen saturation is 100%.   Wt Readings from Last 3 Encounters:  06/30/21 181 lb (82.1 kg)  06/20/21 180 lb (81.6 kg)  05/31/21 187 lb (84.8 kg)    Ocular: Sclerae unicteric, pupils equal, round and reactive to light Ear-nose-throat: Oropharynx clear, dentition fair Lymphatic: No  cervical or supraclavicular adenopathy Lungs no rales or rhonchi, good excursion bilaterally Heart regular rate and rhythm, no murmur appreciated Abd soft, nontender, positive bowel sounds MSK no focal spinal tenderness, no joint edema Neuro: non-focal, well-oriented, appropriate affect Breasts: Deferred   Lab Results  Component Value Date   WBC 3.9 (L) 06/19/2021   HGB 11.5 (L) 06/19/2021   HCT 38.3 06/19/2021   MCV 77.4 (L) 06/19/2021   PLT 243 06/19/2021   Lab Results  Component Value Date   FERRITIN 46 05/31/2021   IRON 16 (L) 05/31/2021   TIBC 336 05/31/2021   UIBC 320 05/31/2021   IRONPCTSAT 5 (L) 05/31/2021   Lab Results  Component Value Date   RETICCTPCT 0.7 04/24/2021   RBC 4.95 06/19/2021   No results found for: KPAFRELGTCHN, LAMBDASER, KAPLAMBRATIO No results found for: IGGSERUM, IGA, IGMSERUM No results found for: Odetta Pink, SPEI   Chemistry      Component Value Date/Time   NA 136 06/19/2021 2331   K 3.6 06/19/2021 2331   CL 104 06/19/2021 2331   CO2 24 06/19/2021 2331   BUN 5 (L) 06/19/2021 2331   CREATININE 0.81 06/19/2021 2331   CREATININE 0.64 06/19/2021 1121   CREATININE 0.56 03/23/2021 0000      Component Value Date/Time   CALCIUM 8.8 (L) 06/19/2021 2331   ALKPHOS 126 06/19/2021 2331   AST 17 06/19/2021 2331   AST 13 (L) 06/19/2021 1121   ALT 14 06/19/2021 2331   ALT 10 06/19/2021 1121   BILITOT 0.4 06/19/2021 2331   BILITOT 0.3 06/19/2021 1121       Impression and Plan: Ms. Bauder is a very pleasant 56 yo caucasian female with metastatic colon cancer, liver mets, BRAF+.  We will proceed with treatment today as planned per MD. We will plan to complete 4 cycles and then repeat scans to assess response.  CEA is pending.  Follow-up in 2 weeks.  She will contact our office with any questions or concerns.   Lottie Dawson, NP 2/3/202312:26 PM

## 2021-07-03 ENCOUNTER — Telehealth: Payer: Self-pay | Admitting: *Deleted

## 2021-07-03 LAB — CEA (IN HOUSE-CHCC): CEA (CHCC-In House): 1.53 ng/mL (ref 0.00–5.00)

## 2021-07-03 LAB — FERRITIN: Ferritin: 42 ng/mL (ref 11–307)

## 2021-07-03 NOTE — Telephone Encounter (Signed)
As noted below by Dr. [name], I informed her that she needs one more dose of IV iron (Ferrlecit) due to a low iron level. She verbalized understanding. LOS sent to scheduling.

## 2021-07-03 NOTE — Telephone Encounter (Signed)
-----   Message from [name], MD sent at 07/01/2021  6:59 AM EST ----- Please call and let her know that the iron levels still quite low.  We need to give her another dose of IV iron.  Please set this up.  Thanks.  [name]

## 2021-07-04 ENCOUNTER — Encounter: Payer: Self-pay | Admitting: Family

## 2021-07-04 ENCOUNTER — Encounter: Payer: Self-pay | Admitting: Hematology & Oncology

## 2021-07-07 ENCOUNTER — Other Ambulatory Visit: Payer: Self-pay

## 2021-07-07 ENCOUNTER — Inpatient Hospital Stay: Payer: 59

## 2021-07-07 VITALS — BP 108/55 | HR 79 | Temp 98.0°F | Resp 17

## 2021-07-07 DIAGNOSIS — D649 Anemia, unspecified: Secondary | ICD-10-CM

## 2021-07-07 DIAGNOSIS — Z5112 Encounter for antineoplastic immunotherapy: Secondary | ICD-10-CM | POA: Diagnosis not present

## 2021-07-07 DIAGNOSIS — D509 Iron deficiency anemia, unspecified: Secondary | ICD-10-CM

## 2021-07-07 MED ORDER — SODIUM CHLORIDE 0.9 % IV SOLN: Freq: Once | INTRAVENOUS | Status: AC

## 2021-07-07 MED ORDER — SODIUM CHLORIDE 0.9 % IV SOLN
125.0000 mg | Freq: Once | INTRAVENOUS | Status: AC
  Administered 2021-07-07: 125 mg via INTRAVENOUS
  Filled 2021-07-07: qty 10

## 2021-07-07 NOTE — Patient Instructions (Signed)
Sodium Ferric Gluconate Complex Injection ?What is this medication? ?SODIUM FERRIC GLUCONATE COMPLEX (SOE dee um FER ik GLOO koe nate KOM pleks) treats low levels of iron (iron deficiency anemia) in people with kidney disease. Iron is a mineral that plays an important role in making red blood cells, which carry oxygen from your lungs to the rest of your body. ?This medicine may be used for other purposes; ask your health care provider or pharmacist if you have questions. ?COMMON BRAND NAME(S): Ferrlecit, Nulecit ?What should I tell my care team before I take this medication? ?They need to know if you have any of the following conditions: ?Anemia that is not from iron deficiency ?High levels of iron in the blood ?An unusual or allergic reaction to iron, other medications, foods, dyes, or preservatives ?Pregnant or are trying to become pregnant ?Breast-feeding ?How should I use this medication? ?This medication is injected into a vein. It is given by your care team in a hospital or clinic setting. ?Talk to your care team about the use of this medication in children. While it may be prescribed for children as young as 6 years for selected conditions, precautions do apply. ?Overdosage: If you think you have taken too much of this medicine contact a poison control center or emergency room at once. ?NOTE: This medicine is only for you. Do not share this medicine with others. ?What if I miss a dose? ?It is important not to miss your dose. Call your care team if you are unable to keep an appointment. ?What may interact with this medication? ?Do not take this medication with any of the following: ?Deferasirox ?Deferoxamine ?Dimercaprol ?This medication may also interact with the following: ?Other iron products ?This list may not describe all possible interactions. Give your health care provider a list of all the medicines, herbs, non-prescription drugs, or dietary supplements you use. Also tell them if you smoke, drink  alcohol, or use illegal drugs. Some items may interact with your medicine. ?What should I watch for while using this medication? ?Your condition will be monitored carefully while you are receiving this medication. ?Visit your care team for regular checks on your progress. You may need blood work while you are taking this medication. ?What side effects may I notice from receiving this medication? ?Side effects that you should report to your care team as soon as possible: ?Allergic reactions--skin rash, itching, hives, swelling of the face, lips, tongue, or throat ?Low blood pressure--dizziness, feeling faint or lightheaded, blurry vision ?Shortness of breath ?Side effects that usually do not require medical attention (report to your care team if they continue or are bothersome): ?Flushing ?Headache ?Joint pain ?Muscle pain ?Nausea ?Pain, redness, or irritation at injection site ?This list may not describe all possible side effects. Call your doctor for medical advice about side effects. You may report side effects to FDA at 1-800-FDA-1088. ?Where should I keep my medication? ?This medication is given in a hospital or clinic and will not be stored at home. ?NOTE: This sheet is a summary. It may not cover all possible information. If you have questions about this medicine, talk to your doctor, pharmacist, or health care provider. ?? 2022 Elsevier/Gold Standard (2020-10-07 00:00:00) ? ?

## 2021-07-12 ENCOUNTER — Inpatient Hospital Stay (HOSPITAL_BASED_OUTPATIENT_CLINIC_OR_DEPARTMENT_OTHER): Payer: 59 | Admitting: Hematology & Oncology

## 2021-07-12 ENCOUNTER — Inpatient Hospital Stay: Payer: 59

## 2021-07-12 ENCOUNTER — Other Ambulatory Visit: Payer: Self-pay

## 2021-07-12 ENCOUNTER — Encounter: Payer: Self-pay | Admitting: Hematology & Oncology

## 2021-07-12 ENCOUNTER — Encounter: Payer: Self-pay | Admitting: *Deleted

## 2021-07-12 VITALS — BP 114/57 | HR 65 | Temp 97.7°F | Resp 18 | Ht 61.0 in | Wt 180.0 lb

## 2021-07-12 VITALS — BP 122/67 | HR 60 | Temp 98.0°F | Resp 18

## 2021-07-12 DIAGNOSIS — C787 Secondary malignant neoplasm of liver and intrahepatic bile duct: Secondary | ICD-10-CM

## 2021-07-12 DIAGNOSIS — R21 Rash and other nonspecific skin eruption: Secondary | ICD-10-CM

## 2021-07-12 DIAGNOSIS — Z5112 Encounter for antineoplastic immunotherapy: Secondary | ICD-10-CM | POA: Diagnosis not present

## 2021-07-12 DIAGNOSIS — D509 Iron deficiency anemia, unspecified: Secondary | ICD-10-CM

## 2021-07-12 DIAGNOSIS — C189 Malignant neoplasm of colon, unspecified: Secondary | ICD-10-CM | POA: Diagnosis not present

## 2021-07-12 DIAGNOSIS — D649 Anemia, unspecified: Secondary | ICD-10-CM

## 2021-07-12 LAB — FERRITIN: Ferritin: 79 ng/mL (ref 11–307)

## 2021-07-12 LAB — CMP (CANCER CENTER ONLY)
ALT: 8 U/L (ref 0–44)
AST: 12 U/L — ABNORMAL LOW (ref 15–41)
Albumin: 3.4 g/dL — ABNORMAL LOW (ref 3.5–5.0)
Alkaline Phosphatase: 94 U/L (ref 38–126)
Anion gap: 6 (ref 5–15)
BUN: 7 mg/dL (ref 6–20)
CO2: 26 mmol/L (ref 22–32)
Calcium: 8.5 mg/dL — ABNORMAL LOW (ref 8.9–10.3)
Chloride: 105 mmol/L (ref 98–111)
Creatinine: 0.49 mg/dL (ref 0.44–1.00)
GFR, Estimated: >60 mL/min (ref >60)
Glucose, Bld: 86 mg/dL (ref 70–99)
Potassium: 3.8 mmol/L (ref 3.5–5.1)
Sodium: 137 mmol/L (ref 135–145)
Total Bilirubin: 0.3 mg/dL (ref 0.3–1.2)
Total Protein: 6.4 g/dL — ABNORMAL LOW (ref 6.5–8.1)

## 2021-07-12 LAB — CBC WITH DIFFERENTIAL (CANCER CENTER ONLY)
Abs Immature Granulocytes: 0.04 10*3/uL (ref 0.00–0.07)
Basophils Absolute: 0.1 10*3/uL (ref 0.0–0.1)
Basophils Relative: 1 %
Eosinophils Absolute: 0.2 10*3/uL (ref 0.0–0.5)
Eosinophils Relative: 4 %
HCT: 34.8 % — ABNORMAL LOW (ref 36.0–46.0)
Hemoglobin: 10.6 g/dL — ABNORMAL LOW (ref 12.0–15.0)
Immature Granulocytes: 1 %
Lymphocytes Relative: 18 %
Lymphs Abs: 1.1 10*3/uL (ref 0.7–4.0)
MCH: 23.5 pg — ABNORMAL LOW (ref 26.0–34.0)
MCHC: 30.5 g/dL (ref 30.0–36.0)
MCV: 77.2 fL — ABNORMAL LOW (ref 80.0–100.0)
Monocytes Absolute: 0.7 10*3/uL (ref 0.1–1.0)
Monocytes Relative: 11 %
Neutro Abs: 4.1 10*3/uL (ref 1.7–7.7)
Neutrophils Relative %: 65 %
Platelet Count: 243 10*3/uL (ref 150–400)
RBC: 4.51 MIL/uL (ref 3.87–5.11)
RDW: 20 % — ABNORMAL HIGH (ref 11.5–15.5)
WBC Count: 6.2 10*3/uL (ref 4.0–10.5)
nRBC: 0 % (ref 0.0–0.2)

## 2021-07-12 LAB — IRON AND IRON BINDING CAPACITY (CC-WL,HP ONLY)
Iron: 32 ug/dL (ref 28–170)
Saturation Ratios: 9 % — ABNORMAL LOW (ref 10.4–31.8)
TIBC: 350 ug/dL (ref 250–450)
UIBC: 318 ug/dL (ref 148–442)

## 2021-07-12 LAB — CEA (IN HOUSE-CHCC): CEA (CHCC-In House): 1.35 ng/mL (ref 0.00–5.00)

## 2021-07-12 LAB — LACTATE DEHYDROGENASE: LDH: 181 U/L (ref 98–192)

## 2021-07-12 LAB — MAGNESIUM: Magnesium: 1.7 mg/dL (ref 1.7–2.4)

## 2021-07-12 MED ORDER — SODIUM CHLORIDE 0.9% FLUSH
10.0000 mL | INTRAVENOUS | Status: DC | PRN
  Administered 2021-07-12: 10 mL

## 2021-07-12 MED ORDER — SODIUM CHLORIDE 0.9 % IV SOLN
125.0000 mg | Freq: Once | INTRAVENOUS | Status: AC
  Administered 2021-07-12: 125 mg via INTRAVENOUS
  Filled 2021-07-12: qty 125

## 2021-07-12 MED ORDER — HEPARIN SOD (PORK) LOCK FLUSH 100 UNIT/ML IV SOLN
500.0000 [IU] | Freq: Once | INTRAVENOUS | Status: AC | PRN
  Administered 2021-07-12: 500 [IU]

## 2021-07-12 MED ORDER — SODIUM CHLORIDE 0.9 % IV SOLN: Freq: Once | INTRAVENOUS | Status: AC

## 2021-07-12 MED ORDER — SODIUM CHLORIDE 0.9 % IV SOLN
6.0000 mg/kg | Freq: Once | INTRAVENOUS | Status: AC
  Administered 2021-07-12: 500 mg via INTRAVENOUS
  Filled 2021-07-12: qty 20

## 2021-07-12 NOTE — Patient Instructions (Signed)

## 2021-07-12 NOTE — Patient Instructions (Signed)
[facility]  Discharge Instructions: Thank you for choosing [facility] to provide your oncology and hematology care.   If you have a lab appointment with the [facility], please go directly to the [facility] and check in at the registration area.  Wear comfortable clothing and clothing appropriate for easy access to any Portacath or PICC line.   We strive to give you quality time with your provider. You may need to reschedule your appointment if you arrive late (15 or more minutes).  Arriving late affects you and other patients whose appointments are after yours.  Also, if you miss three or more appointments without notifying the office, you may be dismissed from the clinic at the providers discretion.      For prescription refill requests, have your pharmacy contact our office and allow 72 hours for refills to be completed.    Today you received the following chemotherapy and/or immunotherapy agents Vectibix      To help prevent nausea and vomiting after your treatment, we encourage you to take your nausea medication as directed.  BELOW ARE SYMPTOMS THAT SHOULD BE REPORTED IMMEDIATELY: *FEVER GREATER THAN 100.4 F (38 C) OR HIGHER *CHILLS OR SWEATING *NAUSEA AND VOMITING THAT IS NOT CONTROLLED WITH YOUR NAUSEA MEDICATION *UNUSUAL SHORTNESS OF BREATH *UNUSUAL BRUISING OR BLEEDING *URINARY PROBLEMS (pain or burning when urinating, or frequent urination) *BOWEL PROBLEMS (unusual diarrhea, constipation, pain near the anus) TENDERNESS IN MOUTH AND THROAT WITH OR WITHOUT PRESENCE OF ULCERS (sore throat, sores in mouth, or a toothache) UNUSUAL RASH, SWELLING OR PAIN  UNUSUAL VAGINAL DISCHARGE OR ITCHING   Items with * indicate a potential emergency and should be followed up as soon as possible or go to the Emergency Department if any problems should occur.  Please show the CHEMOTHERAPY ALERT CARD or IMMUNOTHERAPY ALERT CARD at check-in to the  Emergency Department and triage nurse. Should you have questions after your visit or need to cancel or reschedule your appointment, please contact [facility]  [phone] and follow the prompts.  Office hours are 8:00 a.m. to 4:30 p.m. Monday - Friday. Please note that voicemails left after 4:00 p.m. may not be returned until the following business day.  We are closed weekends and major holidays. You have access to a nurse at all times for urgent questions. Please call the main number to the clinic [phone] and follow the prompts.  For any non-urgent questions, you may also contact your provider using MyChart. We now offer e-Visits for anyone 15 and older to request care online for non-urgent symptoms. For details visit [email or web address].   Also download the MyChart app! Go to the app store, search "MyChart", open the app, select [facility], and log in with your MyChart username and password.  Due to Covid, a mask is required upon entering the hospital/clinic. If you do not have a mask, one will be given to you upon arrival. For doctor visits, patients may have 1 support person aged 54 or older with them. For treatment visits, patients cannot have anyone with them due to current Covid guidelines and our immunocompromised population.

## 2021-07-12 NOTE — Progress Notes (Signed)
Hematology and Oncology Follow Up Visit  Rebecca Bullock [id number] May 06, 1966 56 y.o. 07/12/2021   Principle Diagnosis:  Metastatic colorectal cancer-liver metastasis- BRAF (+)   Current Therapy:        Status post cycle 1 of chemotherapy with FOLFOXIRI/Avastin Encorafenib/Vectibix -- started on 06/19/2021, s/p cycle 2   Interim History:  Rebecca Bullock is here today for follow-up.  Rebecca Bullock is doing incredibly well.  Rebecca Bullock does have a rash on Rebecca Bullock face.  This is from the Vectibix.  I told Rebecca Bullock to try some aloe vera lotion to see if this may help the rash.  Rebecca Bullock CEA has come down to 1.78.  I think this is a incredibly good sign that Rebecca Bullock has responded to treatment.  Rebecca Bullock is having no problems with bowels or bladder.  There is no bleeding.    Rebecca Bullock iron saturation was only 8% 2 weeks ago.  We will give Rebecca Bullock another dose of IV iron today.  Rebecca Bullock has had no problems with pain.    Rebecca Bullock has had a good appetite.  There has been no cough or shortness of breath.  Currently, I would say performance status is ECOG 0.    Medications:  Allergies as of 07/12/2021       Reactions   Irinotecan Other (See Comments)   Flushing, tingling, visual disturbance   Metronidazole Diarrhea   GI Upset (intolerance)   Vancomycin Rash   ?red syndrome        Medication List        Accurate as of July 12, 2021 10:31 AM. If you have any questions, ask your nurse or doctor.          acetaminophen 500 MG tablet Commonly known as: TYLENOL Take 500-1,000 mg by mouth daily as needed for moderate pain or headache.   albuterol 108 (90 Base) MCG/ACT inhaler Commonly known as: VENTOLIN HFA Inhale 2 puffs into the lungs every 6 (six) hours as needed.   betamethasone valerate 0.1 % cream Commonly known as: VALISONE Apply 1 application topically See admin instructions. Apply 1 application topically every other day as needed for psoriasis flare   doxycycline 100 MG tablet Commonly known as: VIBRA-TABS Take 1 tablet  (100 mg total) by mouth daily.   encorafenib 75 MG capsule Commonly known as: BRAFTOVI Take 4 capsules (300 mg total) by mouth daily.   estradiol 0.1 MG/GM vaginal cream Commonly known as: ESTRACE Place 1 Applicatorful vaginally daily as needed (dryness / irritation).   fluticasone 50 MCG/ACT nasal spray Commonly known as: FLONASE Place 2 sprays into both nostrils daily.   hydrocortisone 1 % lotion Apply 1 application topically 2 (two) times daily as needed for itching.   montelukast 10 MG tablet Commonly known as: SINGULAIR Take 1 tablet (10 mg total) by mouth at bedtime.   omeprazole 20 MG tablet Commonly known as: PRILOSEC OTC Take 20 mg by mouth daily as needed (acid reflux).        Allergies:  Allergies  Allergen Reactions   Irinotecan Other (See Comments)    Flushing, tingling, visual disturbance   Metronidazole Diarrhea    GI Upset (intolerance)   Vancomycin Rash    ?red syndrome    Past Medical History, Surgical history, Social history, and Family History were reviewed and updated.  Review of Systems: Review of Systems  Constitutional: Negative.   HENT: Negative.    Eyes: Negative.   Respiratory: Negative.    Cardiovascular: Negative.   Gastrointestinal: Negative.   Genitourinary: Negative.  Musculoskeletal: Negative.   Skin:  Positive for rash.  Neurological: Negative.   Endo/Heme/Allergies: Negative.   Psychiatric/Behavioral: Negative.      Physical Exam:  height is '5\' 1"'  (1.549 m) and weight is 180 lb 0.6 oz (81.7 kg). Rebecca Bullock oral temperature is 97.7 F (36.5 C). Rebecca Bullock blood pressure is 114/57 (abnormal) and Rebecca Bullock pulse is 65. Rebecca Bullock respiration is 18 and oxygen saturation is 100%.   Wt Readings from Last 3 Encounters:  07/12/21 180 lb 0.6 oz (81.7 kg)  06/30/21 181 lb (82.1 kg)  06/20/21 180 lb (81.6 kg)    Physical Exam Vitals reviewed.  HENT:     Head: Normocephalic and atraumatic.  Eyes:     Pupils: Pupils are equal, round, and reactive  to light.  Cardiovascular:     Rate and Rhythm: Normal rate and regular rhythm.     Heart sounds: Normal heart sounds.  Pulmonary:     Effort: Pulmonary effort is normal.     Breath sounds: Normal breath sounds.  Abdominal:     General: Bowel sounds are normal.     Palpations: Abdomen is soft.  Musculoskeletal:        General: No tenderness or deformity. Normal range of motion.     Cervical back: Normal range of motion.  Lymphadenopathy:     Cervical: No cervical adenopathy.  Skin:    General: Skin is warm and dry.     Findings: No erythema or rash.  Neurological:     Mental Status: Rebecca Bullock is alert and oriented to person, place, and time.  Psychiatric:        Behavior: Behavior normal.        Thought Content: Thought content normal.        Judgment: Judgment normal.     Lab Results  Component Value Date   WBC 6.2 07/12/2021   HGB 10.6 (L) 07/12/2021   HCT 34.8 (L) 07/12/2021   MCV 77.2 (L) 07/12/2021   PLT 243 07/12/2021   Lab Results  Component Value Date   FERRITIN 42 06/30/2021   IRON 27 (L) 06/30/2021   TIBC 339 06/30/2021   UIBC 312 06/30/2021   IRONPCTSAT 8 (L) 06/30/2021   Lab Results  Component Value Date   RETICCTPCT 0.7 04/24/2021   RBC 4.51 07/12/2021   No results found for: KPAFRELGTCHN, LAMBDASER, KAPLAMBRATIO No results found for: IGGSERUM, IGA, IGMSERUM No results found for: Ronnald Ramp, A1GS, A2GS, Tillman Sers, SPEI   Chemistry      Component Value Date/Time   NA 137 07/12/2021 0915   K 3.8 07/12/2021 0915   CL 105 07/12/2021 0915   CO2 26 07/12/2021 0915   BUN 7 07/12/2021 0915   CREATININE 0.49 07/12/2021 0915   CREATININE 0.56 03/23/2021 0000      Component Value Date/Time   CALCIUM 8.5 (L) 07/12/2021 0915   ALKPHOS 94 07/12/2021 0915   AST 12 (L) 07/12/2021 0915   ALT 8 07/12/2021 0915   BILITOT 0.3 07/12/2021 0915       Impression and Plan: Rebecca Bullock is a very pleasant 56 yo caucasian female with  metastatic colon cancer.  Rebecca Bullock has a liver met that is quite large.  Rebecca Bullock has some adenopathy.  Again, the CEA has come down quite nicely.  I have to believe this will equate to a response.  We will set Rebecca Bullock up with a PET scan again.  I think this would help [place] out with respect to further management.  If we do see a nice response, then we may think about going after what is in the liver.  This may be done either surgically or with intrahepatic therapy.  Again Rebecca Bullock is quite young.  Rebecca Bullock has a great performance status.  I think we really can be aggressive.  I will do another round of Vectibix.  We will do this in 2 weeks.  I will then do the PET scan after that dose of Vectibix.  I will see Rebecca Bullock back in 1 month.  I think Rebecca Bullock can come in for Vectibix and labs only in 2 weeks.     [name], MD 2/15/202310:31 AM

## 2021-07-12 NOTE — Progress Notes (Signed)
Patient doing well and here for her next cycle. She continues to have rash to her face from the Vectibix but otherwise she denies side effects. She will need a PET around 3/10 to assess for treatment response.   PET scheduled. Patient is aware of appointment date, time and location as well as PET prep. Radiology info sheet also given patient with above information for reinforcement of education.   Oncology Nurse Navigator Documentation  Oncology Nurse Navigator Flowsheets 07/12/2021  Abnormal Finding Date -  Confirmed Diagnosis Date -  Diagnosis Status -  Phase of Treatment -  Chemotherapy Pending- Reason: -  Chemotherapy Actual Start Date: -  Navigator Follow Up Date: 08/04/2021  Navigator Follow Up Reason: Scan Review  Navigator Location [facility]  Navigator Encounter Type Treatment;Appt/Treatment Plan Review  Telephone -  Treatment Initiated Date -  Patient Visit Type MedOnc  Treatment Phase Active Tx  Barriers/Navigation Needs Coordination of Care;Education  Education Other  Interventions Coordination of Care;Education;Psycho-Social Support  Acuity Level 2-Minimal Needs (1-2 Barriers Identified)  Referrals -  Coordination of Care Radiology  Education Method Verbal;Written  Support Groups/Services Friends and Family  Time Spent with Patient 30

## 2021-07-19 ENCOUNTER — Other Ambulatory Visit: Payer: Self-pay | Admitting: *Deleted

## 2021-07-19 ENCOUNTER — Telehealth: Payer: Self-pay | Admitting: *Deleted

## 2021-07-19 DIAGNOSIS — D509 Iron deficiency anemia, unspecified: Secondary | ICD-10-CM

## 2021-07-19 NOTE — Telephone Encounter (Signed)
Patient called to let us know that she has been feeling bad since her last treatment.  Has felt especially tired, more than usual, feeling weak and tired in her legs,  some SOB noted.  Has also had night sweats.  Dr [name] notified.  Based on 07/12/21 lab work, iron saturation was low.  Received one dose of iron at that time.  Patient to come in on Friday to have labwork drawn and one more dose of Ferrelecit regardless of labs per Dr [name]

## 2021-07-21 ENCOUNTER — Inpatient Hospital Stay: Payer: 59

## 2021-07-21 ENCOUNTER — Other Ambulatory Visit: Payer: Self-pay

## 2021-07-21 VITALS — BP 120/66 | HR 77 | Temp 98.1°F | Resp 16

## 2021-07-21 DIAGNOSIS — D649 Anemia, unspecified: Secondary | ICD-10-CM

## 2021-07-21 DIAGNOSIS — D509 Iron deficiency anemia, unspecified: Secondary | ICD-10-CM

## 2021-07-21 DIAGNOSIS — Z5112 Encounter for antineoplastic immunotherapy: Secondary | ICD-10-CM | POA: Diagnosis not present

## 2021-07-21 LAB — COMPREHENSIVE METABOLIC PANEL
ALT: 8 U/L (ref 0–44)
AST: 10 U/L — ABNORMAL LOW (ref 15–41)
Albumin: 3.5 g/dL (ref 3.5–5.0)
Alkaline Phosphatase: 101 U/L (ref 38–126)
Anion gap: 8 (ref 5–15)
BUN: 6 mg/dL (ref 6–20)
CO2: 27 mmol/L (ref 22–32)
Calcium: 8.6 mg/dL — ABNORMAL LOW (ref 8.9–10.3)
Chloride: 104 mmol/L (ref 98–111)
Creatinine, Ser: 0.6 mg/dL (ref 0.44–1.00)
GFR, Estimated: >60 mL/min (ref >60)
Glucose, Bld: 119 mg/dL — ABNORMAL HIGH (ref 70–99)
Potassium: 4 mmol/L (ref 3.5–5.1)
Sodium: 139 mmol/L (ref 135–145)
Total Bilirubin: 0.3 mg/dL (ref 0.3–1.2)
Total Protein: 6.7 g/dL (ref 6.5–8.1)

## 2021-07-21 LAB — CBC WITH DIFFERENTIAL (CANCER CENTER ONLY)
Abs Immature Granulocytes: 0.03 10*3/uL (ref 0.00–0.07)
Basophils Absolute: 0 10*3/uL (ref 0.0–0.1)
Basophils Relative: 0 %
Eosinophils Absolute: 0.1 10*3/uL (ref 0.0–0.5)
Eosinophils Relative: 2 %
HCT: 35.7 % — ABNORMAL LOW (ref 36.0–46.0)
Hemoglobin: 10.9 g/dL — ABNORMAL LOW (ref 12.0–15.0)
Immature Granulocytes: 1 %
Lymphocytes Relative: 20 %
Lymphs Abs: 1.2 10*3/uL (ref 0.7–4.0)
MCH: 23.7 pg — ABNORMAL LOW (ref 26.0–34.0)
MCHC: 30.5 g/dL (ref 30.0–36.0)
MCV: 77.6 fL — ABNORMAL LOW (ref 80.0–100.0)
Monocytes Absolute: 0.6 10*3/uL (ref 0.1–1.0)
Monocytes Relative: 11 %
Neutro Abs: 3.8 10*3/uL (ref 1.7–7.7)
Neutrophils Relative %: 66 %
Platelet Count: 202 10*3/uL (ref 150–400)
RBC: 4.6 MIL/uL (ref 3.87–5.11)
RDW: 19.9 % — ABNORMAL HIGH (ref 11.5–15.5)
WBC Count: 5.8 10*3/uL (ref 4.0–10.5)
nRBC: 0 % (ref 0.0–0.2)

## 2021-07-21 MED ORDER — HEPARIN SOD (PORK) LOCK FLUSH 100 UNIT/ML IV SOLN
500.0000 [IU] | Freq: Once | INTRAVENOUS | Status: AC
  Administered 2021-07-21: 500 [IU] via INTRAVENOUS

## 2021-07-21 MED ORDER — SODIUM CHLORIDE 0.9 % IV SOLN
125.0000 mg | Freq: Once | INTRAVENOUS | Status: AC
  Administered 2021-07-21: 125 mg via INTRAVENOUS
  Filled 2021-07-21: qty 125

## 2021-07-21 MED ORDER — SODIUM CHLORIDE 0.9% FLUSH
10.0000 mL | INTRAVENOUS | Status: DC | PRN
  Administered 2021-07-21: 10 mL via INTRAVENOUS

## 2021-07-21 MED ORDER — SODIUM CHLORIDE 0.9 % IV SOLN: Freq: Once | INTRAVENOUS | Status: AC

## 2021-07-21 NOTE — Patient Instructions (Signed)
Sodium Ferric Gluconate Complex Injection ?What is this medication? ?SODIUM FERRIC GLUCONATE COMPLEX (SOE dee um FER ik GLOO koe nate KOM pleks) treats low levels of iron (iron deficiency anemia) in people with kidney disease. Iron is a mineral that plays an important role in making red blood cells, which carry oxygen from your lungs to the rest of your body. ?This medicine may be used for other purposes; ask your health care provider or pharmacist if you have questions. ?COMMON BRAND NAME(S): Ferrlecit, Nulecit ?What should I tell my care team before I take this medication? ?They need to know if you have any of the following conditions: ?Anemia that is not from iron deficiency ?High levels of iron in the blood ?An unusual or allergic reaction to iron, other medications, foods, dyes, or preservatives ?Pregnant or are trying to become pregnant ?Breast-feeding ?How should I use this medication? ?This medication is injected into a vein. It is given by your care team in a hospital or clinic setting. ?Talk to your care team about the use of this medication in children. While it may be prescribed for children as young as 6 years for selected conditions, precautions do apply. ?Overdosage: If you think you have taken too much of this medicine contact a poison control center or emergency room at once. ?NOTE: This medicine is only for you. Do not share this medicine with others. ?What if I miss a dose? ?It is important not to miss your dose. Call your care team if you are unable to keep an appointment. ?What may interact with this medication? ?Do not take this medication with any of the following: ?Deferasirox ?Deferoxamine ?Dimercaprol ?This medication may also interact with the following: ?Other iron products ?This list may not describe all possible interactions. Give your health care provider a list of all the medicines, herbs, non-prescription drugs, or dietary supplements you use. Also tell them if you smoke, drink  alcohol, or use illegal drugs. Some items may interact with your medicine. ?What should I watch for while using this medication? ?Your condition will be monitored carefully while you are receiving this medication. ?Visit your care team for regular checks on your progress. You may need blood work while you are taking this medication. ?What side effects may I notice from receiving this medication? ?Side effects that you should report to your care team as soon as possible: ?Allergic reactions--skin rash, itching, hives, swelling of the face, lips, tongue, or throat ?Low blood pressure--dizziness, feeling faint or lightheaded, blurry vision ?Shortness of breath ?Side effects that usually do not require medical attention (report to your care team if they continue or are bothersome): ?Flushing ?Headache ?Joint pain ?Muscle pain ?Nausea ?Pain, redness, or irritation at injection site ?This list may not describe all possible side effects. Call your doctor for medical advice about side effects. You may report side effects to FDA at 1-800-FDA-1088. ?Where should I keep my medication? ?This medication is given in a hospital or clinic and will not be stored at home. ?NOTE: This sheet is a summary. It may not cover all possible information. If you have questions about this medicine, talk to your doctor, pharmacist, or health care provider. ?? 2022 Elsevier/Gold Standard (2020-10-07 00:00:00) ? ?

## 2021-07-24 ENCOUNTER — Telehealth: Payer: Self-pay | Admitting: *Deleted

## 2021-07-24 NOTE — Telephone Encounter (Signed)
Patient called to say she is feeling better and with more energy however her arm, shoulder and elbow are still hurting.  Advised patient to call her primary care physician to assess this. Patient understands and will contact her PCP

## 2021-07-26 ENCOUNTER — Ambulatory Visit: Payer: 59

## 2021-07-26 ENCOUNTER — Inpatient Hospital Stay: Payer: 59

## 2021-07-26 ENCOUNTER — Other Ambulatory Visit: Payer: 59

## 2021-07-27 ENCOUNTER — Other Ambulatory Visit: Payer: Self-pay

## 2021-07-27 ENCOUNTER — Inpatient Hospital Stay: Payer: 59

## 2021-07-27 ENCOUNTER — Inpatient Hospital Stay: Payer: 59 | Attending: Hematology & Oncology

## 2021-07-27 DIAGNOSIS — Z5112 Encounter for antineoplastic immunotherapy: Secondary | ICD-10-CM | POA: Insufficient documentation

## 2021-07-27 DIAGNOSIS — Z452 Encounter for adjustment and management of vascular access device: Secondary | ICD-10-CM | POA: Diagnosis not present

## 2021-07-27 DIAGNOSIS — C787 Secondary malignant neoplasm of liver and intrahepatic bile duct: Secondary | ICD-10-CM | POA: Insufficient documentation

## 2021-07-27 DIAGNOSIS — C182 Malignant neoplasm of ascending colon: Secondary | ICD-10-CM

## 2021-07-27 DIAGNOSIS — C189 Malignant neoplasm of colon, unspecified: Secondary | ICD-10-CM

## 2021-07-27 DIAGNOSIS — C19 Malignant neoplasm of rectosigmoid junction: Secondary | ICD-10-CM | POA: Insufficient documentation

## 2021-07-27 LAB — CBC WITH DIFFERENTIAL (CANCER CENTER ONLY)
Abs Immature Granulocytes: 0.03 10*3/uL (ref 0.00–0.07)
Basophils Absolute: 0 10*3/uL (ref 0.0–0.1)
Basophils Relative: 1 %
Eosinophils Absolute: 0.1 10*3/uL (ref 0.0–0.5)
Eosinophils Relative: 2 %
HCT: 38.7 % (ref 36.0–46.0)
Hemoglobin: 11.9 g/dL — ABNORMAL LOW (ref 12.0–15.0)
Immature Granulocytes: 1 %
Lymphocytes Relative: 21 %
Lymphs Abs: 1.3 10*3/uL (ref 0.7–4.0)
MCH: 23.8 pg — ABNORMAL LOW (ref 26.0–34.0)
MCHC: 30.7 g/dL (ref 30.0–36.0)
MCV: 77.6 fL — ABNORMAL LOW (ref 80.0–100.0)
Monocytes Absolute: 0.5 10*3/uL (ref 0.1–1.0)
Monocytes Relative: 8 %
Neutro Abs: 4.1 10*3/uL (ref 1.7–7.7)
Neutrophils Relative %: 67 %
Platelet Count: 292 10*3/uL (ref 150–400)
RBC: 4.99 MIL/uL (ref 3.87–5.11)
RDW: 20.5 % — ABNORMAL HIGH (ref 11.5–15.5)
WBC Count: 6.1 10*3/uL (ref 4.0–10.5)
nRBC: 0 % (ref 0.0–0.2)

## 2021-07-27 LAB — CMP (CANCER CENTER ONLY)
ALT: 9 U/L (ref 0–44)
AST: 12 U/L — ABNORMAL LOW (ref 15–41)
Albumin: 3.8 g/dL (ref 3.5–5.0)
Alkaline Phosphatase: 132 U/L — ABNORMAL HIGH (ref 38–126)
Anion gap: 7 (ref 5–15)
BUN: 7 mg/dL (ref 6–20)
CO2: 30 mmol/L (ref 22–32)
Calcium: 9.3 mg/dL (ref 8.9–10.3)
Chloride: 101 mmol/L (ref 98–111)
Creatinine: 0.57 mg/dL (ref 0.44–1.00)
GFR, Estimated: >60 mL/min (ref >60)
Glucose, Bld: 96 mg/dL (ref 70–99)
Potassium: 3.7 mmol/L (ref 3.5–5.1)
Sodium: 138 mmol/L (ref 135–145)
Total Bilirubin: 0.3 mg/dL (ref 0.3–1.2)
Total Protein: 7.3 g/dL (ref 6.5–8.1)

## 2021-07-27 MED ORDER — ALTEPLASE 2 MG IJ SOLR
2.0000 mg | Freq: Once | INTRAMUSCULAR | Status: AC
  Administered 2021-07-27: 2 mg
  Filled 2021-07-27: qty 2

## 2021-07-27 MED ORDER — SODIUM CHLORIDE 0.9 % IV SOLN
6.0000 mg/kg | Freq: Once | INTRAVENOUS | Status: AC
  Administered 2021-07-27: 500 mg via INTRAVENOUS
  Filled 2021-07-27: qty 5

## 2021-07-27 MED ORDER — SODIUM CHLORIDE 0.9% FLUSH
10.0000 mL | INTRAVENOUS | Status: DC | PRN
  Administered 2021-07-27: 10 mL

## 2021-07-27 MED ORDER — HEPARIN SOD (PORK) LOCK FLUSH 100 UNIT/ML IV SOLN
500.0000 [IU] | Freq: Once | INTRAVENOUS | Status: AC | PRN
  Administered 2021-07-27: 500 [IU]

## 2021-07-27 MED ORDER — SODIUM CHLORIDE 0.9 % IV SOLN: Freq: Once | INTRAVENOUS | Status: AC

## 2021-07-27 NOTE — Patient Instructions (Signed)

## 2021-07-27 NOTE — Patient Instructions (Signed)
[facility]  Discharge Instructions: ?Thank you for choosing [facility] to provide your oncology and hematology care.  ? ?If you have a lab appointment with the [facility], please go directly to the [facility] and check in at the registration area. ? ?Wear comfortable clothing and clothing appropriate for easy access to any Portacath or PICC line.  ? ?We strive to give you quality time with your provider. You may need to reschedule your appointment if you arrive late (15 or more minutes).  Arriving late affects you and other patients whose appointments are after yours.  Also, if you miss three or more appointments without notifying the office, you may be dismissed from the clinic at the provider?s discretion.    ?  ?For prescription refill requests, have your pharmacy contact our office and allow 72 hours for refills to be completed.   ? ?Today you received the following chemotherapy and/or immunotherapy agents Vectibix    ?  ?To help prevent nausea and vomiting after your treatment, we encourage you to take your nausea medication as directed. ? ?BELOW ARE SYMPTOMS THAT SHOULD BE REPORTED IMMEDIATELY: ?*FEVER GREATER THAN 100.4 F (38 ?C) OR HIGHER ?*CHILLS OR SWEATING ?*NAUSEA AND VOMITING THAT IS NOT CONTROLLED WITH YOUR NAUSEA MEDICATION ?*UNUSUAL SHORTNESS OF BREATH ?*UNUSUAL BRUISING OR BLEEDING ?*URINARY PROBLEMS (pain or burning when urinating, or frequent urination) ?*BOWEL PROBLEMS (unusual diarrhea, constipation, pain near the anus) ?TENDERNESS IN MOUTH AND THROAT WITH OR WITHOUT PRESENCE OF ULCERS (sore throat, sores in mouth, or a toothache) ?UNUSUAL RASH, SWELLING OR PAIN  ?UNUSUAL VAGINAL DISCHARGE OR ITCHING  ? ?Items with * indicate a potential emergency and should be followed up as soon as possible or go to the Emergency Department if any problems should occur. ? ?Please show the CHEMOTHERAPY ALERT CARD or IMMUNOTHERAPY ALERT CARD at check-in to the  Emergency Department and triage nurse. ?Should you have questions after your visit or need to cancel or reschedule your appointment, please contact [facility]  [phone] and follow the prompts.  Office hours are 8:00 a.m. to 4:30 p.m. Monday - Friday. Please note that voicemails left after 4:00 p.m. may not be returned until the following business day.  We are closed weekends and major holidays. You have access to a nurse at all times for urgent questions. Please call the main number to the clinic [phone] and follow the prompts. ? ?For any non-urgent questions, you may also contact your provider using MyChart. We now offer e-Visits for anyone 58 and older to request care online for non-urgent symptoms. For details visit [email or web address]. ?  ?Also download the MyChart app! Go to the app store, search "MyChart", open the app, select [facility], and log in with your MyChart username and password. ? ?Due to Covid, a mask is required upon entering the hospital/clinic. If you do not have a mask, one will be given to you upon arrival. For doctor visits, patients may have 1 support person aged 81 or older with them. For treatment visits, patients cannot have anyone with them due to current Covid guidelines and our immunocompromised population.  ?

## 2021-07-28 ENCOUNTER — Other Ambulatory Visit: Payer: Self-pay | Admitting: Physician Assistant

## 2021-07-28 ENCOUNTER — Ambulatory Visit (INDEPENDENT_AMBULATORY_CARE_PROVIDER_SITE_OTHER): Payer: 59 | Admitting: Physician Assistant

## 2021-07-28 ENCOUNTER — Other Ambulatory Visit (HOSPITAL_BASED_OUTPATIENT_CLINIC_OR_DEPARTMENT_OTHER): Payer: Self-pay

## 2021-07-28 ENCOUNTER — Encounter: Payer: Self-pay | Admitting: Physician Assistant

## 2021-07-28 ENCOUNTER — Ambulatory Visit (INDEPENDENT_AMBULATORY_CARE_PROVIDER_SITE_OTHER): Payer: 59

## 2021-07-28 VITALS — BP 106/52 | HR 76 | Ht 61.0 in | Wt 177.0 lb

## 2021-07-28 DIAGNOSIS — C189 Malignant neoplasm of colon, unspecified: Secondary | ICD-10-CM

## 2021-07-28 DIAGNOSIS — C787 Secondary malignant neoplasm of liver and intrahepatic bile duct: Secondary | ICD-10-CM

## 2021-07-28 DIAGNOSIS — M25512 Pain in left shoulder: Secondary | ICD-10-CM

## 2021-07-28 LAB — CEA (IN HOUSE-CHCC): CEA (CHCC-In House): 1.1 ng/mL (ref 0.00–5.00)

## 2021-07-28 IMAGING — DX DG SHOULDER 2+V*L*
3 series · 3 of 3 positions shown · non-contrast
Comparison: None.

CLINICAL DATA: Left shoulder pain for 1 month. Stage IV colon
cancer.

EXAM:
LEFT SHOULDER - 2+ VIEW

[shoulder grashey]
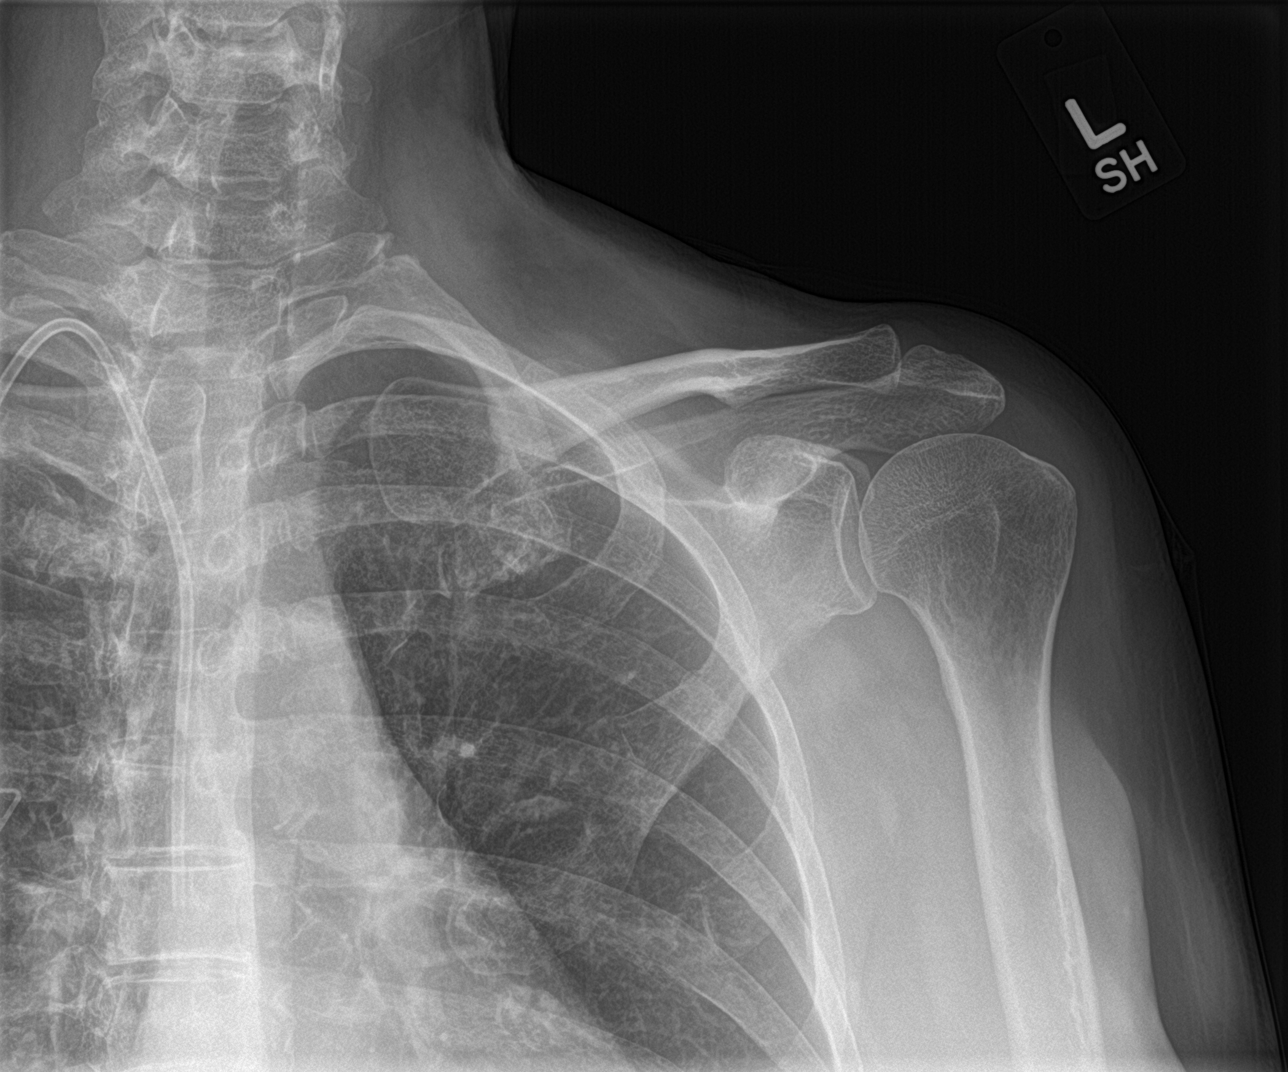

[shoulder y view]
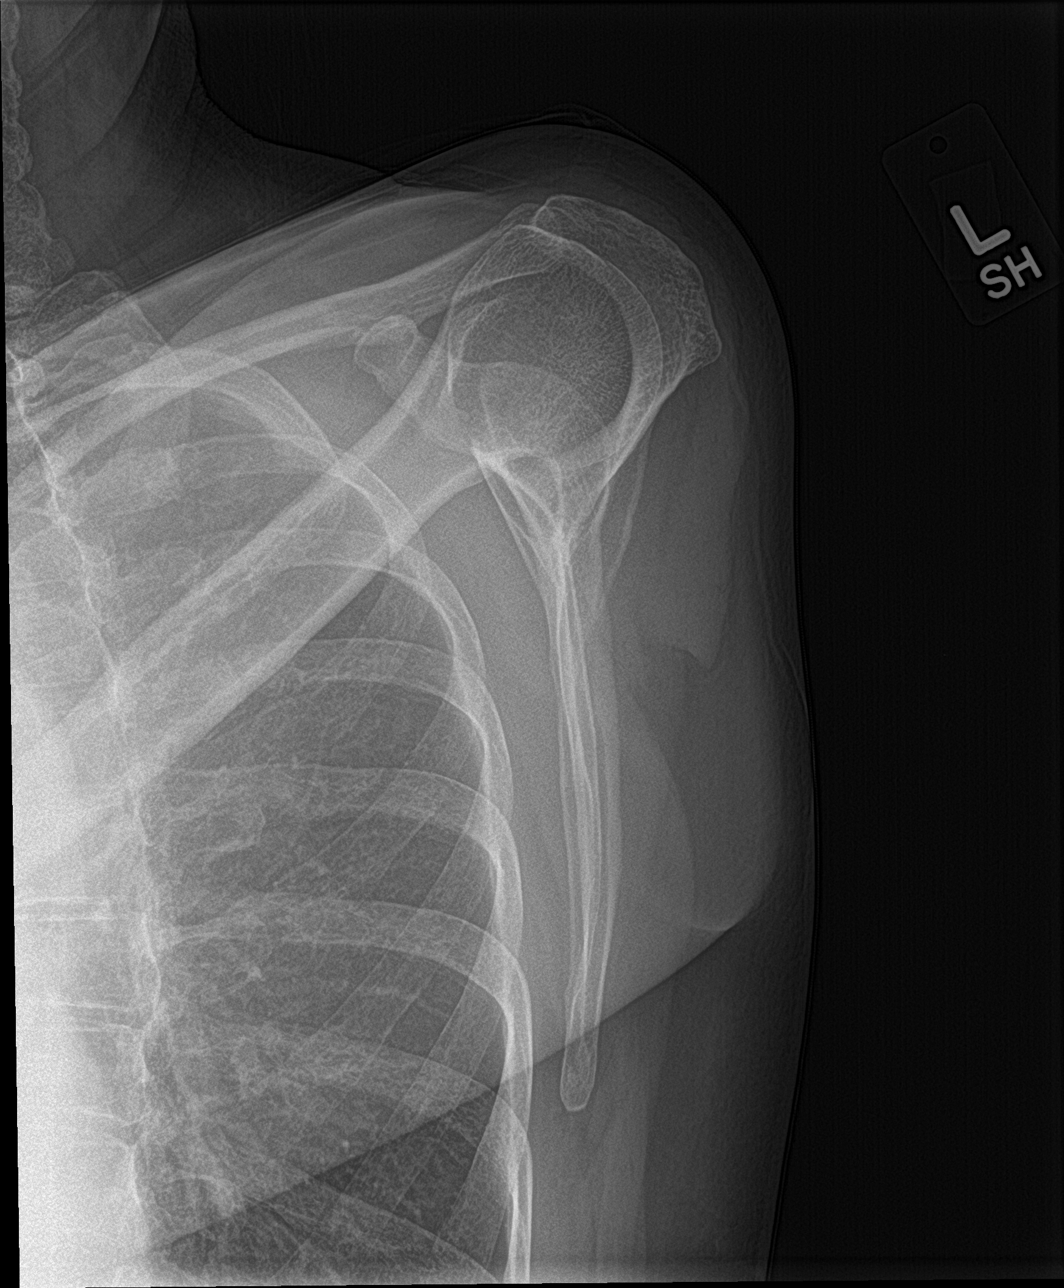

[shoulder axillary]
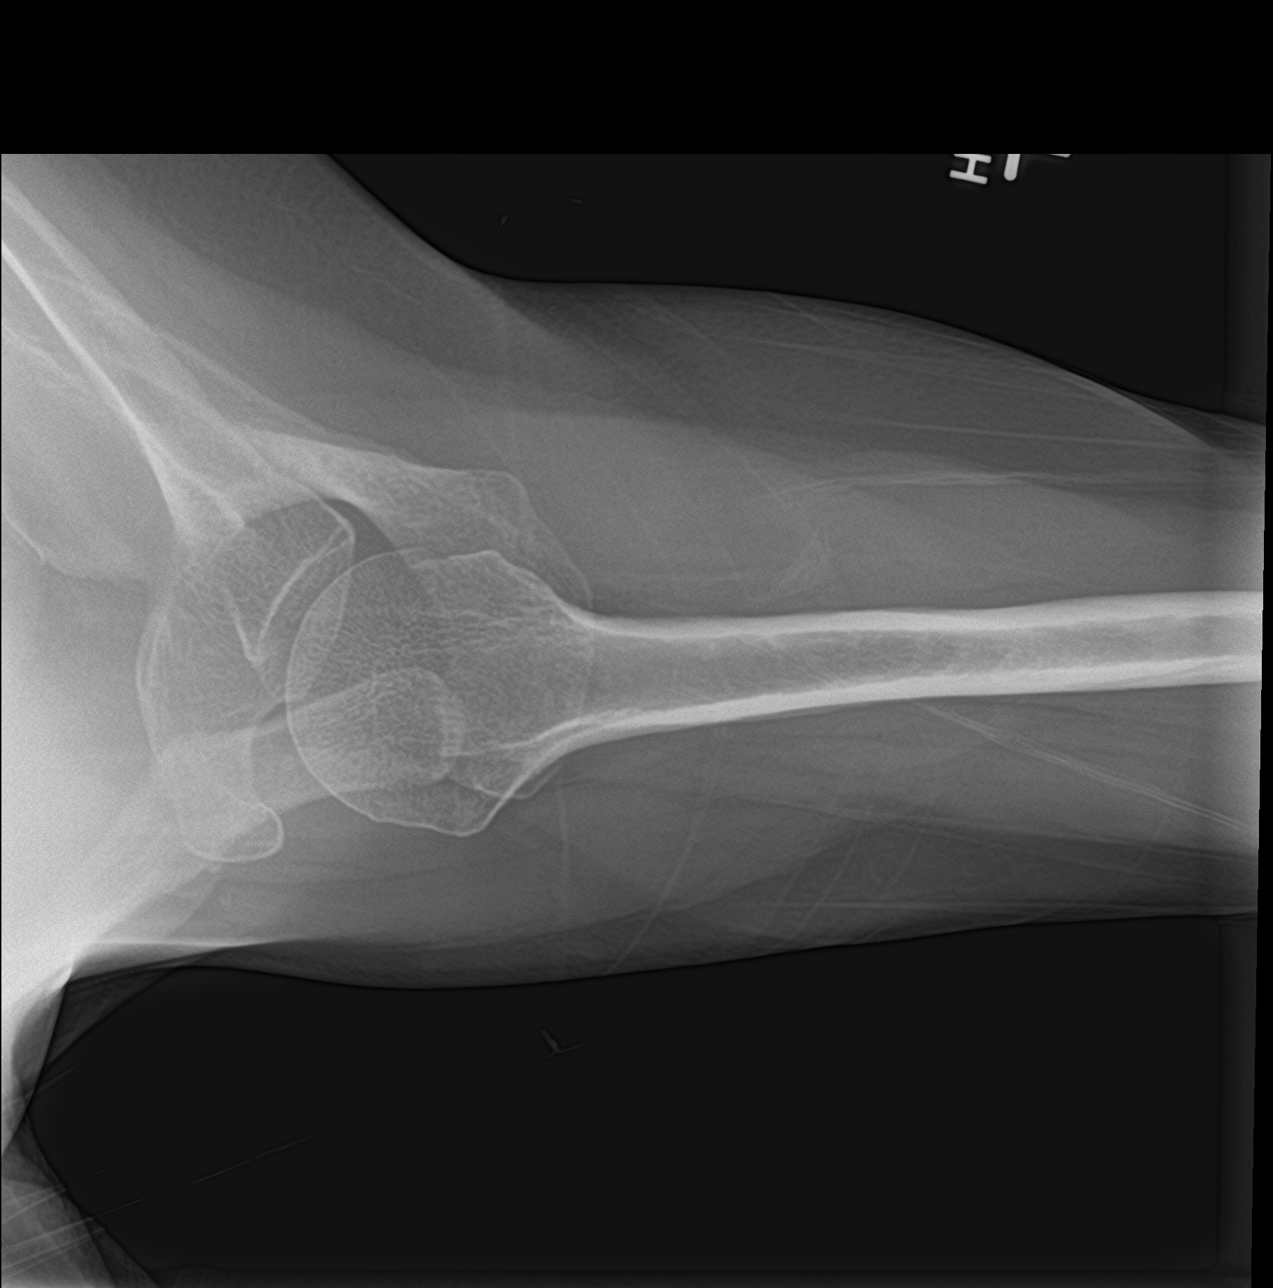

[3 of 3 positions shown; findings below may reference images not displayed]

FINDINGS: No fracture. No glenohumeral dislocation. No evidence of
acromioclavicular separation. No aggressive appearing focal osseous
lesions. Partially visualized right internal jugular Port-A-Cath
terminating over the lower third of the SVC.
IMPRESSION: No acute osseous abnormality. No aggressive appearing focal osseous
lesions.

## 2021-07-28 MED ORDER — MELOXICAM 15 MG PO TABS
15.0000 mg | ORAL_TABLET | Freq: Every day | ORAL | 1 refills | Status: DC
  Filled 2021-07-28: qty 30, 30d supply, fill #0

## 2021-07-28 MED ORDER — TRAMADOL HCL 50 MG PO TABS
50.0000 mg | ORAL_TABLET | Freq: Three times a day (TID) | ORAL | 0 refills | Status: DC | PRN
Start: 2021-07-28 — End: 2021-08-04
  Filled 2021-07-28: qty 21, 7d supply, fill #0

## 2021-07-28 NOTE — Progress Notes (Signed)
No aggressive bone lesion so VERY good news. No major arthritic changes. I suspect more bicep tendonitis. Dr. [name] said you could take NSAID. I will send over mobic to take for the next 2 weeks WITH food. Use tramadol as needed only.  ? ?Look up bicep tendonitis proximal on google and can do some of those exercises.  ?

## 2021-07-28 NOTE — Progress Notes (Signed)
? ?  Subjective:  ? ? Patient ID: Rebecca Bullock, female    DOB: [birth year], 56 y.o.   MRN: [id number] ? ?HPI ?Pt is a 56 yo female who presents to the clinic with left shoulder pain for the last month. No injury. Not had shoulder pain before. Taking tylenol but not helping and making her feel a little funny. She is in chemo treatments for colon cancer. Most of her pain is anterior shoulder and down into bicep.  ? ?.. ?Active Ambulatory Problems  ?  Diagnosis Date Noted  ? Perimenopausal 09/25/2016  ? Insomnia 09/25/2016  ? Seasonal allergic rhinitis 09/25/2016  ? Vitamin D deficiency 09/25/2016  ? Microscopic hematuria 01/26/2019  ? 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 SOB (shortness of breath) 03/13/2021  ? Symptomatic anemia 03/14/2021  ? Chronic cough 03/23/2021  ? Severe anemia 03/23/2021  ? SOB (shortness of breath) on exertion 03/23/2021  ? IDA (iron deficiency anemia) 03/24/2021  ? Hiatal hernia 04/06/2021  ? Post-menopausal bleeding 04/10/2021  ? Bilateral lower abdominal cramping 04/11/2021  ? History of uterine fibroid 04/11/2021  ? Colon cancer metastasized to liver [facility]) 05/05/2021  ? Goals of care, counseling/discussion 05/05/2021  ? Family history of pancreatic cancer 06/05/2021  ? Family history of lung cancer 06/05/2021  ? Colon cancer ([facility]) 05/03/2021  ? Menorrhagia with regular cycle 11/19/2016  ? Lichenoid dermatitis 10/16/2019  ? Fibroids 11/26/2018  ? Endometrial thickening on ultrasound 05/03/2021  ? Endometrial mass 05/03/2021  ? Abnormal uterine bleeding 11/26/2018  ? ?Resolved Ambulatory Problems  ?  Diagnosis Date Noted  ? No Resolved Ambulatory Problems  ? ?No Additional Past Medical History  ? ? ? ? ? ?Review of Systems ?See HPI.  ?   ?Objective:  ? Physical Exam ? ?Left shoulder:  ?Tenderness over bursa and down into biceps and at bicep tendon insertion. ?Pain with [place].  ?Negative speeds and yeragson.  ?Strength 5/5 of upper ext.  ? ? ? ?   ?Assessment & Plan:  ?..Rebecca Bullock was seen today  for arm pain. ? ?Diagnoses and all orders for this visit: ? ?Acute pain of left shoulder ?-     traMADol (ULTRAM) 50 MG tablet; Take 1 tablet (50 mg total) by mouth every 8 (eight) hours as needed for moderate pain. ?-     DG Shoulder Left; Future ? ?Colon cancer metastasized to liver [facility]) ? ? ?No injury. Unclear etiology. ?bursitis vs bicep tendonitis ?? If she can use NSAIDs with chemo treatment. Will reach out to Dr. [name]. ?Will get xray today. ?Tylenol not helping and making her feel funny. Ok to use tramadol as needed. ?Follow up as needed or if symptoms change or worsen.  ? ? ?

## 2021-08-03 ENCOUNTER — Telehealth: Payer: Self-pay | Admitting: Neurology

## 2021-08-03 DIAGNOSIS — M25512 Pain in left shoulder: Secondary | ICD-10-CM

## 2021-08-03 NOTE — Telephone Encounter (Signed)
Patient left another VM regarding this. Rebecca Bullock not here today.  ?

## 2021-08-03 NOTE — Telephone Encounter (Signed)
Patient left a vm stating medication given for her arm pain is not helping (tramadol). In a lot of pain and having a hard time sleeping. Please advise.  ?

## 2021-08-04 ENCOUNTER — Other Ambulatory Visit (HOSPITAL_BASED_OUTPATIENT_CLINIC_OR_DEPARTMENT_OTHER): Payer: Self-pay

## 2021-08-04 ENCOUNTER — Other Ambulatory Visit: Payer: Self-pay

## 2021-08-04 ENCOUNTER — Encounter: Payer: Self-pay | Admitting: *Deleted

## 2021-08-04 ENCOUNTER — Ambulatory Visit (HOSPITAL_COMMUNITY)
Admission: RE | Admit: 2021-08-04 | Discharge: 2021-08-04 | Disposition: A | Discharge status: Home / Self Care | Payer: 59 | Source: Ambulatory Visit | Attending: Hematology & Oncology | Admitting: Hematology & Oncology

## 2021-08-04 DIAGNOSIS — C787 Secondary malignant neoplasm of liver and intrahepatic bile duct: Secondary | ICD-10-CM | POA: Insufficient documentation

## 2021-08-04 DIAGNOSIS — C189 Malignant neoplasm of colon, unspecified: Secondary | ICD-10-CM | POA: Insufficient documentation

## 2021-08-04 LAB — GLUCOSE, CAPILLARY: Glucose-Capillary: 99 mg/dL (ref 70–99)

## 2021-08-04 MED ORDER — TRAMADOL HCL 50 MG PO TABS
100.0000 mg | ORAL_TABLET | Freq: Three times a day (TID) | ORAL | 0 refills | Status: AC | PRN
  Filled 2021-08-04: qty 42, 7d supply, fill #0

## 2021-08-04 MED ORDER — FLUDEOXYGLUCOSE F - 18 (FDG) INJECTION
8.8200 | Freq: Once | INTRAVENOUS | Status: AC | PRN
  Administered 2021-08-04: 8.82 via INTRAVENOUS

## 2021-08-04 MED ORDER — GABAPENTIN 100 MG PO CAPS
ORAL_CAPSULE | ORAL | 0 refills | Status: DC
  Filled 2021-08-04: qty 30, 12d supply, fill #0

## 2021-08-04 NOTE — Progress Notes (Signed)
Oncology Nurse Navigator Documentation ? ?Oncology Nurse Navigator Flowsheets 08/04/2021  ?Abnormal Finding Date -  ?Confirmed Diagnosis Date -  ?Diagnosis Status -  ?Phase of Treatment -  ?Chemotherapy Pending- Reason: -  ?Chemotherapy Actual Start Date: -  ?Navigator Follow Up Date: 08/09/2021  ?Navigator Follow Up Reason: Follow-up Appointment;Chemotherapy  ?Navigator Location [facility]  ?Navigator Encounter Type Scan Review  ?Telephone -  ?Treatment Initiated Date -  ?Patient Visit Type MedOnc  ?Treatment Phase Active Tx  ?Barriers/Navigation Needs Coordination of Care;Education  ?Education -  ?Interventions None Required  ?Acuity Level 2-Minimal Needs (1-2 Barriers Identified)  ?Referrals -  ?Coordination of Care -  ?Education Method -  ?Support Groups/Services Friends and Family  ?Time Spent with Patient 15  ?  ?

## 2021-08-04 NOTE — Telephone Encounter (Signed)
So we can try bumping up the tramadol to 2 tabs 3 times a day which is 100 mg which is the max dose.  But I think we could see if at least this is helpful.  I also sent over prescription for another medication called gabapentin it is safe to overlap these it can cause sleepiness and sedation so it has a taper on it to started at bedtime and then gradually increase.  It can help if there is some component of a pinched nerve in that shoulder that is causing the pain to persist it can help reduce that pain it will not stop it completely but it can help.  If this is not helping over the weekend then please definitely reach back out next week and we can try something completely different if needed. ?

## 2021-08-04 NOTE — Telephone Encounter (Signed)
Spoke with patient and made aware of recommendations. She will reach out Monday and let us know how she is doing.  ?

## 2021-08-07 ENCOUNTER — Telehealth: Payer: Self-pay

## 2021-08-07 NOTE — Telephone Encounter (Signed)
Called and informed patient of results, patient verbalized understanding and denies any questions or concerns at this time.  ? ?

## 2021-08-07 NOTE — Telephone Encounter (Signed)
-----   Message from [name], MD sent at 08/04/2021  4:20 PM EST ----- ?Call -the cancer is smaller.  This is wonderful news.  Excellent work.  Pete ?

## 2021-08-09 ENCOUNTER — Inpatient Hospital Stay: Payer: 59 | Admitting: Hematology & Oncology

## 2021-08-09 ENCOUNTER — Telehealth (HOSPITAL_BASED_OUTPATIENT_CLINIC_OR_DEPARTMENT_OTHER): Payer: Self-pay

## 2021-08-09 ENCOUNTER — Other Ambulatory Visit: Payer: Self-pay

## 2021-08-09 ENCOUNTER — Inpatient Hospital Stay: Payer: 59

## 2021-08-09 ENCOUNTER — Encounter: Payer: Self-pay | Admitting: *Deleted

## 2021-08-09 ENCOUNTER — Encounter: Payer: Self-pay | Admitting: Hematology & Oncology

## 2021-08-09 VITALS — BP 123/61 | HR 79 | Temp 97.9°F | Resp 18 | Ht 61.0 in | Wt 180.0 lb

## 2021-08-09 DIAGNOSIS — C189 Malignant neoplasm of colon, unspecified: Secondary | ICD-10-CM

## 2021-08-09 DIAGNOSIS — M25512 Pain in left shoulder: Secondary | ICD-10-CM

## 2021-08-09 DIAGNOSIS — Z5112 Encounter for antineoplastic immunotherapy: Secondary | ICD-10-CM | POA: Diagnosis not present

## 2021-08-09 DIAGNOSIS — C787 Secondary malignant neoplasm of liver and intrahepatic bile duct: Secondary | ICD-10-CM | POA: Diagnosis not present

## 2021-08-09 LAB — CMP (CANCER CENTER ONLY)
ALT: 10 U/L (ref 0–44)
AST: 11 U/L — ABNORMAL LOW (ref 15–41)
Albumin: 4 g/dL (ref 3.5–5.0)
Alkaline Phosphatase: 88 U/L (ref 38–126)
Anion gap: 8 (ref 5–15)
BUN: 7 mg/dL (ref 6–20)
CO2: 26 mmol/L (ref 22–32)
Calcium: 9.3 mg/dL (ref 8.9–10.3)
Chloride: 105 mmol/L (ref 98–111)
Creatinine: 0.52 mg/dL (ref 0.44–1.00)
GFR, Estimated: >60 mL/min (ref >60)
Glucose, Bld: 136 mg/dL — ABNORMAL HIGH (ref 70–99)
Potassium: 3.7 mmol/L (ref 3.5–5.1)
Sodium: 139 mmol/L (ref 135–145)
Total Bilirubin: 0.3 mg/dL (ref 0.3–1.2)
Total Protein: 6.3 g/dL — ABNORMAL LOW (ref 6.5–8.1)

## 2021-08-09 LAB — CBC WITH DIFFERENTIAL (CANCER CENTER ONLY)
Abs Immature Granulocytes: 0.03 10*3/uL (ref 0.00–0.07)
Basophils Absolute: 0 10*3/uL (ref 0.0–0.1)
Basophils Relative: 0 %
Eosinophils Absolute: 0.1 10*3/uL (ref 0.0–0.5)
Eosinophils Relative: 1 %
HCT: 38.3 % (ref 36.0–46.0)
Hemoglobin: 11.7 g/dL — ABNORMAL LOW (ref 12.0–15.0)
Immature Granulocytes: 1 %
Lymphocytes Relative: 15 %
Lymphs Abs: 0.9 10*3/uL (ref 0.7–4.0)
MCH: 24.1 pg — ABNORMAL LOW (ref 26.0–34.0)
MCHC: 30.5 g/dL (ref 30.0–36.0)
MCV: 78.8 fL — ABNORMAL LOW (ref 80.0–100.0)
Monocytes Absolute: 0.4 10*3/uL (ref 0.1–1.0)
Monocytes Relative: 7 %
Neutro Abs: 4.8 10*3/uL (ref 1.7–7.7)
Neutrophils Relative %: 76 %
Platelet Count: 212 10*3/uL (ref 150–400)
RBC: 4.86 MIL/uL (ref 3.87–5.11)
RDW: 20.1 % — ABNORMAL HIGH (ref 11.5–15.5)
WBC Count: 6.3 10*3/uL (ref 4.0–10.5)
nRBC: 0 % (ref 0.0–0.2)

## 2021-08-09 LAB — IRON AND IRON BINDING CAPACITY (CC-WL,HP ONLY)
Iron: 50 ug/dL (ref 28–170)
Saturation Ratios: 15 % (ref 10.4–31.8)
TIBC: 343 ug/dL (ref 250–450)
UIBC: 293 ug/dL (ref 148–442)

## 2021-08-09 LAB — FERRITIN: Ferritin: 44 ng/mL (ref 11–307)

## 2021-08-09 LAB — LACTATE DEHYDROGENASE: LDH: 150 U/L (ref 98–192)

## 2021-08-09 LAB — CEA (IN HOUSE-CHCC): CEA (CHCC-In House): 1.24 ng/mL (ref 0.00–5.00)

## 2021-08-09 MED ORDER — HEPARIN SOD (PORK) LOCK FLUSH 100 UNIT/ML IV SOLN
500.0000 [IU] | Freq: Once | INTRAVENOUS | Status: AC | PRN
  Administered 2021-08-09: 500 [IU]

## 2021-08-09 MED ORDER — SODIUM CHLORIDE 0.9 % IV SOLN
6.0000 mg/kg | Freq: Once | INTRAVENOUS | Status: AC
  Administered 2021-08-09: 500 mg via INTRAVENOUS
  Filled 2021-08-09: qty 20

## 2021-08-09 MED ORDER — SODIUM CHLORIDE 0.9% FLUSH
10.0000 mL | INTRAVENOUS | Status: DC | PRN
  Administered 2021-08-09: 10 mL

## 2021-08-09 MED ORDER — SODIUM CHLORIDE 0.9 % IV SOLN: Freq: Once | INTRAVENOUS | Status: AC

## 2021-08-09 NOTE — Progress Notes (Signed)
Oncology Nurse Navigator Documentation ? ?Oncology Nurse Navigator Flowsheets 08/09/2021  ?Abnormal Finding Date -  ?Confirmed Diagnosis Date -  ?Diagnosis Status -  ?Phase of Treatment -  ?Chemotherapy Pending- Reason: -  ?Chemotherapy Actual Start Date: -  ?Navigator Follow Up Date: 09/06/2021  ?Navigator Follow Up Reason: Follow-up Appointment;Chemotherapy  ?Navigator Location [facility]  ?Navigator Encounter Type Treatment;Appt/Treatment Plan Review  ?Telephone -  ?Treatment Initiated Date -  ?Patient Visit Type MedOnc  ?Treatment Phase Active Tx  ?Barriers/Navigation Needs Coordination of Care;Education  ?Education -  ?Interventions Psycho-Social Support  ?Acuity Level 2-Minimal Needs (1-2 Barriers Identified)  ?Referrals -  ?Coordination of Care -  ?Education Method -  ?Support Groups/Services Friends and Family  ?Time Spent with Patient 15  ?  ?

## 2021-08-09 NOTE — Progress Notes (Signed)
?Hematology and Oncology Follow Up Visit ? ?Rebecca Bullock ?[id number] ?Oct 03, 1965 56 y.o. ?08/09/2021 ? ? ?Principle Diagnosis:  ?Metastatic colorectal cancer-liver metastasis- BRAF (+) ?  ?Current Therapy:        ?Status post cycle 1 of chemotherapy with FOLFOXIRI/Avastin ?Encorafenib/Vectibix -- started on 06/19/2021, s/p cycle #3 ?  ?Interim History:  Rebecca Bullock is here today for follow-up.  She comes in with one of her friends.  Her main problem actually does not with the cancer.  Is with her left shoulder.  I suspect she probably has a rotator cuff tear.  She can lift her shoulder up to 90 degrees and then cannot lift it up any further. ? ? ? ?We will have to to get an MRI.  Again I suspect that she probably has a torn rotator cuff. ? ?As far as her cancer is concerned, she is done incredibly well with this.  Her PET scan showed that she has had a very nice response.  Is nice that she is not on chemotherapy but is just on anti-BRAF therapy. ? ?She does have the skin issues.  This is mostly on her face.  I told her to try some aloe vera gel to see if this may help cleared up. ? ?Her last CEA level she is doing incredibly well.  She does have a rash on her face.  This is from the Vectibix.  I told her to try some aloe vera lotion to see if this may help the rash. ? ?Her CEA back in early March was down to 1.1. ? ?She has had no change in bowel or bladder habits.  There has been no bleeding.  We did give her iron on occasion.  I am sure her iron this is probably still on the low side.  She last got iron back in February. ? ?Overall, I would say performance status is probably ECOG 1.    ? ?Medications:  ?Allergies as of 08/09/2021   ? ?   Reactions  ? Irinotecan Other (See Comments)  ? Flushing, tingling, visual disturbance  ? Metronidazole Diarrhea  ? Vancomycin Rash  ? ?red syndrome  ? ?  ? ?  ?Medication List  ?  ? ?  ? Accurate as of August 09, 2021 10:25 AM. If you have any questions, ask your nurse or doctor.  ?  ?   ? ?  ? ?acetaminophen 500 MG tablet ?Commonly known as: TYLENOL ?Take 500-1,000 mg by mouth daily as needed for moderate pain or headache. ?  ?albuterol 108 (90 Base) MCG/ACT inhaler ?Commonly known as: VENTOLIN HFA ?Inhale 2 puffs into the lungs every 6 (six) hours as needed. ?  ?betamethasone valerate 0.1 % cream ?Commonly known as: VALISONE ?Apply 1 application topically See admin instructions. Apply 1 application topically every other day as needed for psoriasis flare ?  ?encorafenib 75 MG capsule ?Commonly known as: BRAFTOVI ?Take 4 capsules (300 mg total) by mouth daily. ?  ?estradiol 0.1 MG/GM vaginal cream ?Commonly known as: ESTRACE ?Place 1 Applicatorful vaginally daily as needed (dryness / irritation). ?  ?fluticasone 50 MCG/ACT nasal spray ?Commonly known as: FLONASE ?Place 2 sprays into both nostrils daily. ?  ?gabapentin 100 MG capsule ?Commonly known as: NEURONTIN ?Take 1 capsule (100 mg total) by mouth at bedtime for 2 days, THEN 1 capsule (100 mg total) 2 (two) times daily for 2 days, THEN 1 capsule (100 mg total) 3 (three) times daily for 10 days. ?Start taking on: August 04, 2021 ?  ?hydrocortisone 1 % lotion ?Apply 1 application topically 2 (two) times daily as needed for itching. ?  ?meloxicam 15 MG tablet ?Commonly known as: MOBIC ?Take 1 tablet (15 mg total) by mouth daily. ?  ?montelukast 10 MG tablet ?Commonly known as: SINGULAIR ?Take 1 tablet (10 mg total) by mouth at bedtime. ?  ?omeprazole 20 MG tablet ?Commonly known as: PRILOSEC OTC ?Take 20 mg by mouth daily as needed (acid reflux). ?  ?traMADol 50 MG tablet ?Commonly known as: ULTRAM ?Take 2 tablets (100 mg total) by mouth every 8 (eight) hours as needed for up to 7 days for moderate pain. ?  ? ?  ? ? ?Allergies:  ?Allergies  ?Allergen Reactions  ? Irinotecan Other (See Comments)  ?  Flushing, tingling, visual disturbance  ? Metronidazole Diarrhea  ? Vancomycin Rash  ?  ?red syndrome  ? ? ?Past Medical History, Surgical history,  Social history, and Family History were reviewed and updated. ? ?Review of Systems: ?Review of Systems  ?Constitutional: Negative.   ?HENT: Negative.    ?Eyes: Negative.   ?Respiratory: Negative.    ?Cardiovascular: Negative.   ?Gastrointestinal: Negative.   ?Genitourinary: Negative.   ?Musculoskeletal: Negative.   ?Skin:  Positive for rash.  ?Neurological: Negative.   ?Endo/Heme/Allergies: Negative.   ?Psychiatric/Behavioral: Negative.    ? ? ?Physical Exam: ? height is _0  (1.549 m) and weight is 81.6 kg. Her oral temperature is 97.9 ?F (36.6 ?C). Her blood pressure is 123/61 and her pulse is 79. Her respiration is 18 and oxygen saturation is 100%.  ? ?Wt Readings from Last 3 Encounters:  ?08/09/21 81.6 kg  ?08/09/21 81.6 kg  ?07/28/21 80.3 kg  ? ? ?Physical Exam ?Vitals reviewed.  ?HENT:  ?   Head: Normocephalic and atraumatic.  ?Eyes:  ?   Pupils: Pupils are equal, round, and reactive to light.  ?Cardiovascular:  ?   Rate and Rhythm: Normal rate and regular rhythm.  ?   Heart sounds: Normal heart sounds.  ?Pulmonary:  ?   Effort: Pulmonary effort is normal.  ?   Breath sounds: Normal breath sounds.  ?Abdominal:  ?   General: Bowel sounds are normal.  ?   Palpations: Abdomen is soft.  ?Musculoskeletal:     ?   General: No tenderness or deformity. Normal range of motion.  ?   Cervical back: Normal range of motion.  ?Lymphadenopathy:  ?   Cervical: No cervical adenopathy.  ?Skin: ?   General: Skin is warm and dry.  ?   Findings: No erythema or rash.  ?Neurological:  ?   Mental Status: She is alert and oriented to person, place, and time.  ?Psychiatric:     ?   Behavior: Behavior normal.     ?   Thought Content: Thought content normal.     ?   Judgment: Judgment normal.  ? ? ? ?Lab Results  ?Component Value Date  ? WBC 6.3 08/09/2021  ? HGB 11.7 (L) 08/09/2021  ? HCT 38.3 08/09/2021  ? MCV 78.8 (L) 08/09/2021  ? PLT 212 08/09/2021  ? ?Lab Results  ?Component Value Date  ? FERRITIN 79 07/12/2021  ? IRON 32  07/12/2021  ? TIBC 350 07/12/2021  ? UIBC 318 07/12/2021  ? IRONPCTSAT 9 (L) 07/12/2021  ? ?Lab Results  ?Component Value Date  ? RETICCTPCT 0.7 04/24/2021  ? RBC 4.86 08/09/2021  ? ?No results found for: KPAFRELGTCHN, LAMBDASER, KAPLAMBRATIO ?No results found for: IGGSERUM,  IGA, IGMSERUM ?No results found for: TOTALPROTELP, ALBUMINELP, A1GS, A2GS, BETS, BETA2SER, GAMS, MSPIKE, SPEI ?  Chemistry   ?   ?Component Value Date/Time  ? NA 139 08/09/2021 0942  ? K 3.7 08/09/2021 0942  ? CL 105 08/09/2021 0942  ? CO2 26 08/09/2021 0942  ? BUN 7 08/09/2021 0942  ? CREATININE 0.52 08/09/2021 0942  ? CREATININE 0.56 03/23/2021 0000  ?    ?Component Value Date/Time  ? CALCIUM 9.3 08/09/2021 0942  ? ALKPHOS 88 08/09/2021 0942  ? AST 11 (L) 08/09/2021 0942  ? ALT 10 08/09/2021 0942  ? BILITOT 0.3 08/09/2021 0942  ?  ? ? ? ?Impression and Plan: Rebecca Bullock is a very pleasant 56 yo caucasian female with metastatic colon cancer.  She has a liver met that is quite large.  She has some adenopathy. ? ?The PET scan is very reassuring.  Again we are on the right track with respect to her therapy.  We will continue her on the anti-BRAF therapy. ? ?We will have to get an MRI for the left shoulder. ? ?We will have to see what her iron levels look like. ? ?I probably would give her 3-4 more treatments and then repeat the PET scan.  At some point, because of her young age and she is healthy we might want to think about surgical intervention for her liver and maybe for the primary in the colon.  I know this is a "gray area" but at least we should consider this given her young age and incredibly good performance status. ? ?I will plan to see her back in another month. ? ? ? ?[name], MD ?3/15/202310:25 AM ? ?

## 2021-08-09 NOTE — Patient Instructions (Signed)
Rebecca Bullock AT [facility]  Discharge Instructions: ?Thank you for choosing [facility] to provide your oncology and hematology care.  ? ?If you have a lab appointment with the [facility], please go directly to the [facility] and check in at the registration area. ? ?Wear comfortable clothing and clothing appropriate for easy access to any Portacath or PICC line.  ? ?We strive to give you quality time with your provider. You may need to reschedule your appointment if you arrive late (15 or more minutes).  Arriving late affects you and other patients whose appointments are after yours.  Also, if you miss three or more appointments without notifying the office, you may be dismissed from the clinic at the provider?s discretion.    ?  ?For prescription refill requests, have your pharmacy contact our office and allow 72 hours for refills to be completed.   ? ?Today you received the following chemotherapy and/or immunotherapy agents Vectibix    ?  ?To help prevent nausea and vomiting after your treatment, we encourage you to take your nausea medication as directed. ? ?BELOW ARE SYMPTOMS THAT SHOULD BE REPORTED IMMEDIATELY: ?*FEVER GREATER THAN 100.4 F (38 ?C) OR HIGHER ?*CHILLS OR SWEATING ?*NAUSEA AND VOMITING THAT IS NOT CONTROLLED WITH YOUR NAUSEA MEDICATION ?*UNUSUAL SHORTNESS OF BREATH ?*UNUSUAL BRUISING OR BLEEDING ?*URINARY PROBLEMS (pain or burning when urinating, or frequent urination) ?*BOWEL PROBLEMS (unusual diarrhea, constipation, pain near the anus) ?TENDERNESS IN MOUTH AND THROAT WITH OR WITHOUT PRESENCE OF ULCERS (sore throat, sores in mouth, or a toothache) ?UNUSUAL RASH, SWELLING OR PAIN  ?UNUSUAL VAGINAL DISCHARGE OR ITCHING  ? ?Items with * indicate a potential emergency and should be followed up as soon as possible or go to the Emergency Department if any problems should occur. ? ?Please show the CHEMOTHERAPY ALERT CARD or IMMUNOTHERAPY ALERT CARD at check-in to the  Emergency Department and triage nurse. ?Should you have questions after your visit or need to cancel or reschedule your appointment, please contact [facility]  [phone] and follow the prompts.  Office hours are 8:00 a.m. to 4:30 p.m. Monday - Friday. Please note that voicemails left after 4:00 p.m. may not be returned until the following business day.  We are closed weekends and major holidays. You have access to a nurse at all times for urgent questions. Please call the main number to the clinic [phone] and follow the prompts. ? ?For any non-urgent questions, you may also contact your provider using MyChart. We now offer e-Visits for anyone 3 and older to request care online for non-urgent symptoms. For details visit [email or web address]. ?  ?Also download the MyChart app! Go to the app store, search "MyChart", open the app, select [facility], and log in with your MyChart username and password. ? ?Due to Covid, a mask is required upon entering the hospital/clinic. If you do not have a mask, one will be given to you upon arrival. For doctor visits, patients may have 1 support person aged 33 or older with them. For treatment visits, patients cannot have anyone with them due to current Covid guidelines and our immunocompromised population.  ?

## 2021-08-10 ENCOUNTER — Encounter: Payer: Self-pay | Admitting: Family

## 2021-08-10 ENCOUNTER — Encounter: Payer: Self-pay | Admitting: Hematology & Oncology

## 2021-08-16 ENCOUNTER — Other Ambulatory Visit: Payer: Self-pay

## 2021-08-16 ENCOUNTER — Ambulatory Visit (HOSPITAL_BASED_OUTPATIENT_CLINIC_OR_DEPARTMENT_OTHER)
Admission: RE | Admit: 2021-08-16 | Discharge: 2021-08-16 | Disposition: A | Discharge status: Home / Self Care | Payer: 59 | Source: Ambulatory Visit | Attending: Hematology & Oncology | Admitting: Hematology & Oncology

## 2021-08-16 DIAGNOSIS — M25512 Pain in left shoulder: Secondary | ICD-10-CM | POA: Diagnosis present

## 2021-08-16 IMAGING — MR MR SHOULDER*L* WO/W CM
8 of 9 series · 37 of 40 positions shown · IV contrast (GADAVIST)
Comparison: Left shoulder radiograph [DATE]

CLINICAL DATA: Shoulder pain, no prior imaging Shoulder pain.
Difficulty raising the shoulder. ? rotator cuff

EXAM:
MRI OF THE LEFT SHOULDER WITHOUT AND WITH CONTRAST
TECHNIQUE: Multiplanar, multisequence MR imaging of the LEFT shoulder was
performed before and after the administration of intravenous
contrast.
CONTRAST:  7.5mL GADAVIST GADOBUTROL 1 MMOL/ML IV SOLN

[Series 4: T2 fat-sat · axial · 4.0mm · 0.62mm/px · z∈[-57,+40]mm · 6 of 24 slices shown (1 of 3)]
[im 1/24]
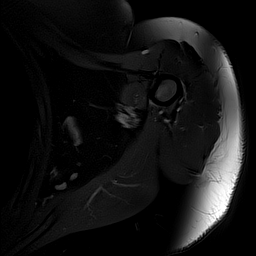
[im 5/24]
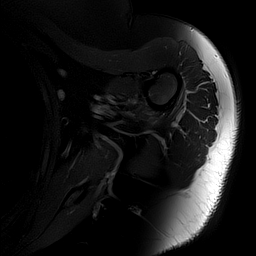
[im 10/24]
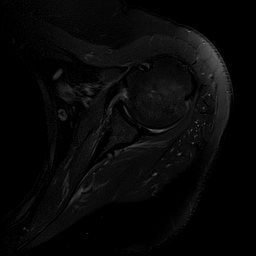
[im 14/24]
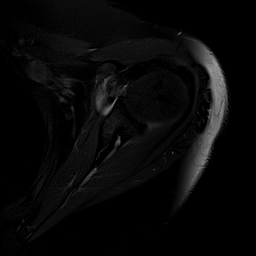
[im 19/24]
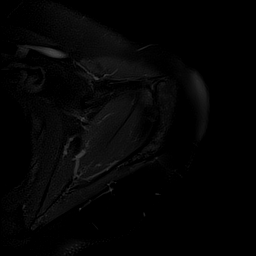
[im 24/24]
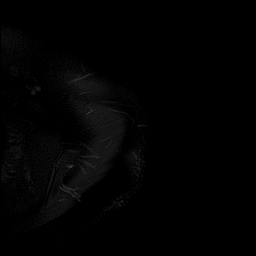

[Series 5: T2 fat-sat · oblique · 4.0mm · 0.62mm/px · 5 of 19 slices shown (2 of 3)]
[im 1/19]
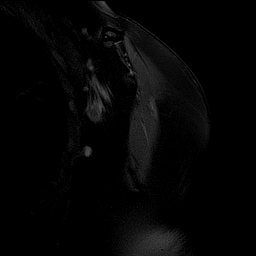
[im 5/19]
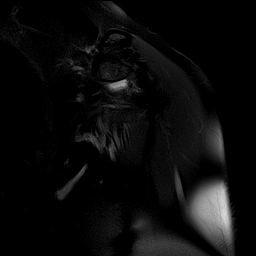
[im 10/19]
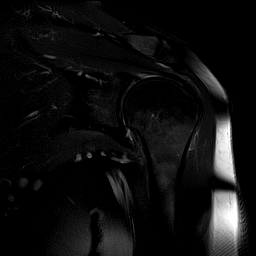
[im 14/19]
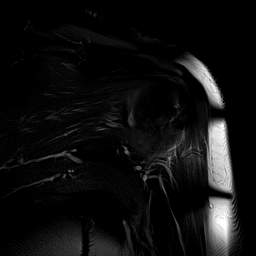
[im 19/19]
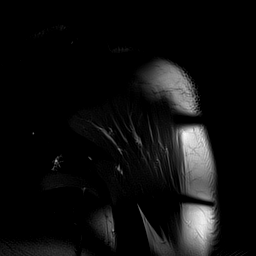

[Series 7: PD · oblique · 4.0mm · 0.62mm/px · 4 of 19 slices shown]
[im 1/19]
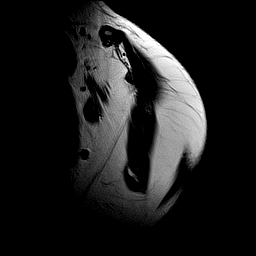
[im 7/19]
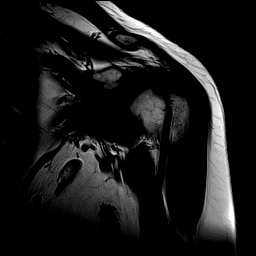
[im 13/19]
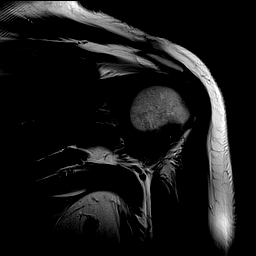
[im 19/19]
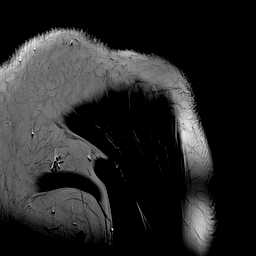

[Series 8: T1 · oblique · 4.0mm · 0.59mm/px · 4 of 20 slices shown]
[im 1/20]
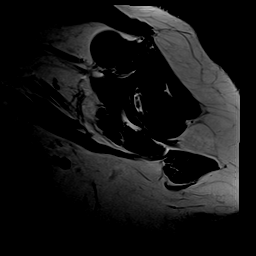
[im 7/20]
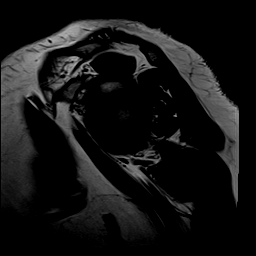
[im 13/20]
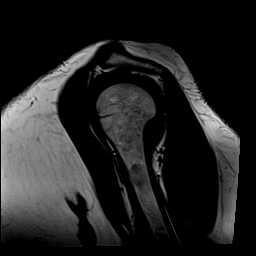
[im 20/20]
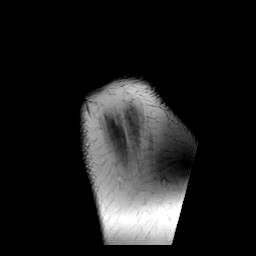

[Series 9: T2 fat-sat · oblique · 4.0mm · 0.59mm/px · 4 of 20 slices shown (3 of 3)]
[im 1/20]
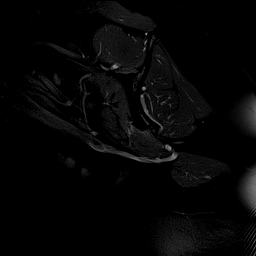
[im 7/20]
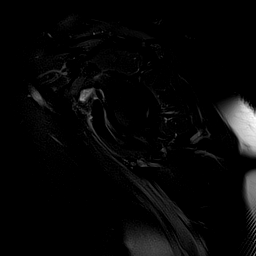
[im 13/20]
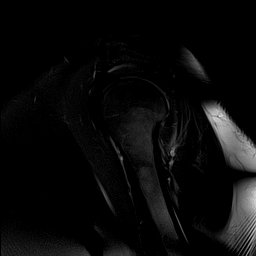
[im 20/20]
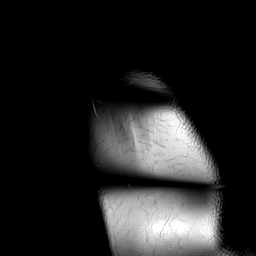

[Series 10: T1 fat-sat · axial · 4.0mm · 0.62mm/px · z∈[-57,+40]mm · 5 of 24 slices shown (1 of 2)]
[im 1/24]
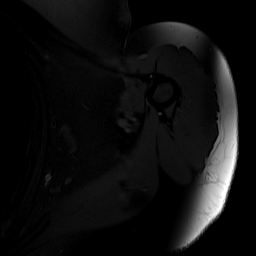
[im 6/24]
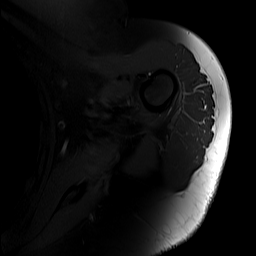
[im 12/24]
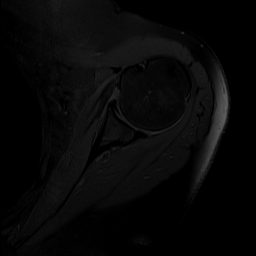
[im 18/24]
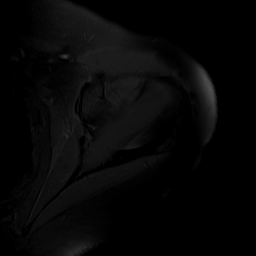
[im 24/24]
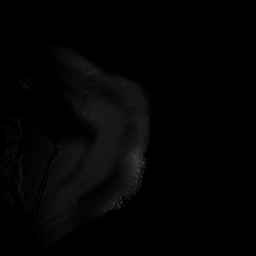

[Series 11: T1 fat-sat · axial · 4.0mm · 0.62mm/px · z∈[-57,+40]mm · 5 of 24 slices shown (2 of 2)]
[im 1/24]
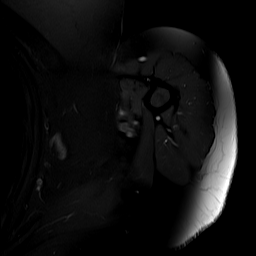
[im 6/24]
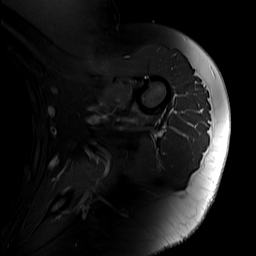
[im 12/24]
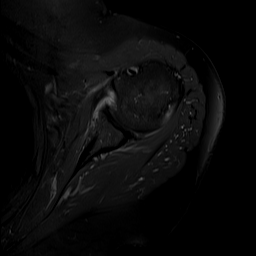
[im 18/24]
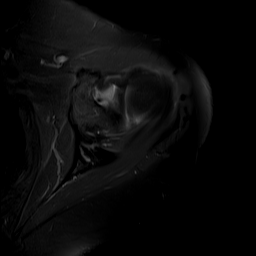
[im 24/24]
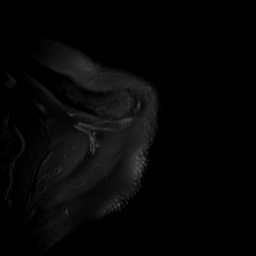

[Series 12: T1 fat-sat post-contrast · oblique · 4.0mm · 0.50mm/px · 4 of 19 slices shown]
[im 1/19]
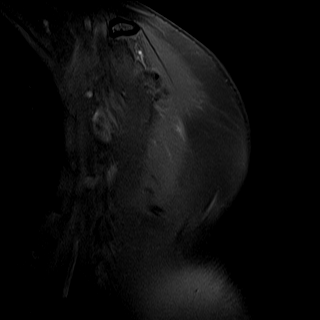
[im 7/19]
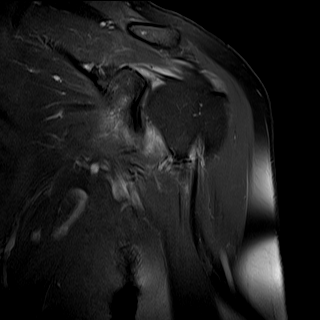
[im 13/19]
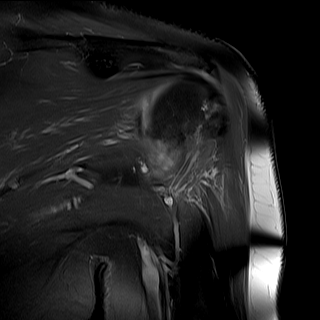
[im 19/19]
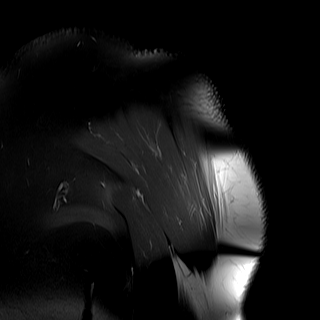

[37 of 40 positions shown; findings below may reference images not displayed]

FINDINGS: Rotator cuff: There is mild distal supraspinatus and infraspinatus
tendinosis. Teres minor tendon is intact. Subscapularis tendon is
intact.

Muscles: No significant muscle atrophy.

Biceps Long Head: Fraying and possible partial tear at the biceps
labral anchor. No tendon rupture.

Acromioclavicular Joint: No significant arthropathy of the
acromioclavicular joint. Small amount subacromial/subdeltoid bursal
fluid with enhancement.

Glenohumeral Joint: Trace joint fluid with synovial enhancement.
There is replacement of subcoracoid fat by low T1 signal material in
there is edema within the rotator interval. There is mild thickening
of the inferior glenohumeral ligament with increased T2 hyperintense
signal.

Labrum: There is a superior labral tear at the biceps labral anchor
extending anteriorly, from 12 [8R] o'clock. There is probable
extension into the biceps tendon.

Bones: No acute osseous abnormality.  No aggressive osseous lesion.

Other: No additional finding
IMPRESSION: Mild distal supraspinatus and infraspinatus tendinosis. No
high-grade or retracted cuff tear. No muscle atrophy.

Findings of synovitis and mild subacromial-subdeltoid bursitis.

Superior labral tear at the biceps labral anchor extending
anteriorly from [DATE], and possible extension into the long
head biceps tendon.

Findings involving the inferior glenohumeral ligament and rotator
interval can be seen in the setting of adhesive capsulitis.

## 2021-08-16 MED ORDER — GADOBUTROL 1 MMOL/ML IV SOLN
7.5000 mL | Freq: Once | INTRAVENOUS | Status: AC | PRN
  Administered 2021-08-16: 7.5 mL via INTRAVENOUS

## 2021-08-18 ENCOUNTER — Other Ambulatory Visit: Payer: Self-pay | Admitting: *Deleted

## 2021-08-18 ENCOUNTER — Other Ambulatory Visit (HOSPITAL_BASED_OUTPATIENT_CLINIC_OR_DEPARTMENT_OTHER): Payer: Self-pay

## 2021-08-18 ENCOUNTER — Telehealth: Payer: Self-pay | Admitting: *Deleted

## 2021-08-18 DIAGNOSIS — C182 Malignant neoplasm of ascending colon: Secondary | ICD-10-CM

## 2021-08-18 DIAGNOSIS — D649 Anemia, unspecified: Secondary | ICD-10-CM

## 2021-08-18 DIAGNOSIS — M25512 Pain in left shoulder: Secondary | ICD-10-CM

## 2021-08-18 DIAGNOSIS — C189 Malignant neoplasm of colon, unspecified: Secondary | ICD-10-CM

## 2021-08-18 MED ORDER — HYDROCODONE-ACETAMINOPHEN 5-325 MG PO TABS
1.0000 | ORAL_TABLET | Freq: Three times a day (TID) | ORAL | 0 refills | Status: DC | PRN
  Filled 2021-08-18: qty 30, 10d supply, fill #0

## 2021-08-18 NOTE — Telephone Encounter (Signed)
Call placed to patient to notify her that Dr. [name] is going to send in Vicodin 5/325 for pain.  Pt instructed to take one tablet every eight hours as needed.  Pt is appreciative of assistance and requests that prescription be sent to the [facility].  ?

## 2021-08-18 NOTE — Telephone Encounter (Signed)
Patient notified per order of Dr. [name] that the MRI of the left shoulder shows "inflammation, strain, a possible tear, no cancer" and that a referral will be placed to Dr. Rhona Raider with [facility].  Pt is appreciative of call and would like to know if there is anything that she can take for pain.  She states that Tylenol, Ibuprofen and Tramadol do not help. Informed pt that I would check with Dr. [name] and call her back.  ?

## 2021-08-18 NOTE — Progress Notes (Signed)
rf

## 2021-08-23 ENCOUNTER — Other Ambulatory Visit (HOSPITAL_BASED_OUTPATIENT_CLINIC_OR_DEPARTMENT_OTHER): Payer: Self-pay

## 2021-08-23 ENCOUNTER — Inpatient Hospital Stay: Payer: 59

## 2021-08-23 ENCOUNTER — Other Ambulatory Visit: Payer: Self-pay | Admitting: Hematology & Oncology

## 2021-08-23 ENCOUNTER — Other Ambulatory Visit: Payer: Self-pay | Admitting: *Deleted

## 2021-08-23 DIAGNOSIS — D649 Anemia, unspecified: Secondary | ICD-10-CM

## 2021-08-23 DIAGNOSIS — C787 Secondary malignant neoplasm of liver and intrahepatic bile duct: Secondary | ICD-10-CM

## 2021-08-23 DIAGNOSIS — C182 Malignant neoplasm of ascending colon: Secondary | ICD-10-CM

## 2021-08-23 DIAGNOSIS — Z5112 Encounter for antineoplastic immunotherapy: Secondary | ICD-10-CM | POA: Diagnosis not present

## 2021-08-23 LAB — CBC WITH DIFFERENTIAL (CANCER CENTER ONLY)
Abs Immature Granulocytes: 0.03 10*3/uL (ref 0.00–0.07)
Basophils Absolute: 0 10*3/uL (ref 0.0–0.1)
Basophils Relative: 1 %
Eosinophils Absolute: 0.1 10*3/uL (ref 0.0–0.5)
Eosinophils Relative: 2 %
HCT: 37.4 % (ref 36.0–46.0)
Hemoglobin: 11.5 g/dL — ABNORMAL LOW (ref 12.0–15.0)
Immature Granulocytes: 1 %
Lymphocytes Relative: 19 %
Lymphs Abs: 1.1 10*3/uL (ref 0.7–4.0)
MCH: 24.4 pg — ABNORMAL LOW (ref 26.0–34.0)
MCHC: 30.7 g/dL (ref 30.0–36.0)
MCV: 79.4 fL — ABNORMAL LOW (ref 80.0–100.0)
Monocytes Absolute: 0.7 10*3/uL (ref 0.1–1.0)
Monocytes Relative: 11 %
Neutro Abs: 4 10*3/uL (ref 1.7–7.7)
Neutrophils Relative %: 66 %
Platelet Count: 206 10*3/uL (ref 150–400)
RBC: 4.71 MIL/uL (ref 3.87–5.11)
RDW: 19.4 % — ABNORMAL HIGH (ref 11.5–15.5)
WBC Count: 6 10*3/uL (ref 4.0–10.5)
nRBC: 0 % (ref 0.0–0.2)

## 2021-08-23 LAB — CMP (CANCER CENTER ONLY)
ALT: 13 U/L (ref 0–44)
AST: 13 U/L — ABNORMAL LOW (ref 15–41)
Albumin: 4.2 g/dL (ref 3.5–5.0)
Alkaline Phosphatase: 84 U/L (ref 38–126)
Anion gap: 7 (ref 5–15)
BUN: 9 mg/dL (ref 6–20)
CO2: 28 mmol/L (ref 22–32)
Calcium: 9.3 mg/dL (ref 8.9–10.3)
Chloride: 103 mmol/L (ref 98–111)
Creatinine: 0.49 mg/dL (ref 0.44–1.00)
GFR, Estimated: >60 mL/min (ref >60)
Glucose, Bld: 125 mg/dL — ABNORMAL HIGH (ref 70–99)
Potassium: 3.9 mmol/L (ref 3.5–5.1)
Sodium: 138 mmol/L (ref 135–145)
Total Bilirubin: 0.2 mg/dL — ABNORMAL LOW (ref 0.3–1.2)
Total Protein: 6.7 g/dL (ref 6.5–8.1)

## 2021-08-23 MED ORDER — HYDROCODONE-ACETAMINOPHEN 5-325 MG PO TABS
1.0000 | ORAL_TABLET | ORAL | 0 refills | Status: DC | PRN
  Filled 2021-08-23: qty 90, 15d supply, fill #0

## 2021-08-23 MED ORDER — HEPARIN SOD (PORK) LOCK FLUSH 100 UNIT/ML IV SOLN
500.0000 [IU] | Freq: Once | INTRAVENOUS | Status: AC | PRN
  Administered 2021-08-23: 500 [IU]

## 2021-08-23 MED ORDER — SODIUM CHLORIDE 0.9 % IV SOLN
6.0000 mg/kg | Freq: Once | INTRAVENOUS | Status: AC
  Administered 2021-08-23: 500 mg via INTRAVENOUS
  Filled 2021-08-23: qty 20

## 2021-08-23 MED ORDER — SODIUM CHLORIDE 0.9 % IV SOLN: Freq: Once | INTRAVENOUS | Status: AC

## 2021-08-23 MED ORDER — SODIUM CHLORIDE 0.9% FLUSH
10.0000 mL | INTRAVENOUS | Status: DC | PRN
  Administered 2021-08-23: 10 mL

## 2021-08-23 NOTE — Patient Instructions (Signed)

## 2021-08-23 NOTE — Patient Instructions (Signed)
[facility]  Discharge Instructions: ?Thank you for choosing [facility] to provide your oncology and hematology care.  ? ?If you have a lab appointment with the [facility], please go directly to the [facility] and check in at the registration area. ? ?Wear comfortable clothing and clothing appropriate for easy access to any Portacath or PICC line.  ? ?We strive to give you quality time with your provider. You may need to reschedule your appointment if you arrive late (15 or more minutes).  Arriving late affects you and other patients whose appointments are after yours.  Also, if you miss three or more appointments without notifying the office, you may be dismissed from the clinic at the provider?s discretion.    ?  ?For prescription refill requests, have your pharmacy contact our office and allow 72 hours for refills to be completed.   ? ?Today you received the following chemotherapy and/or immunotherapy agents Vectibix    ?  ?To help prevent nausea and vomiting after your treatment, we encourage you to take your nausea medication as directed. ? ?BELOW ARE SYMPTOMS THAT SHOULD BE REPORTED IMMEDIATELY: ?*FEVER GREATER THAN 100.4 F (38 ?C) OR HIGHER ?*CHILLS OR SWEATING ?*NAUSEA AND VOMITING THAT IS NOT CONTROLLED WITH YOUR NAUSEA MEDICATION ?*UNUSUAL SHORTNESS OF BREATH ?*UNUSUAL BRUISING OR BLEEDING ?*URINARY PROBLEMS (pain or burning when urinating, or frequent urination) ?*BOWEL PROBLEMS (unusual diarrhea, constipation, pain near the anus) ?TENDERNESS IN MOUTH AND THROAT WITH OR WITHOUT PRESENCE OF ULCERS (sore throat, sores in mouth, or a toothache) ?UNUSUAL RASH, SWELLING OR PAIN  ?UNUSUAL VAGINAL DISCHARGE OR ITCHING  ? ?Items with * indicate a potential emergency and should be followed up as soon as possible or go to the Emergency Department if any problems should occur. ? ?Please show the CHEMOTHERAPY ALERT CARD or IMMUNOTHERAPY ALERT CARD at check-in to the  Emergency Department and triage nurse. ?Should you have questions after your visit or need to cancel or reschedule your appointment, please contact [facility]  [phone] and follow the prompts.  Office hours are 8:00 a.m. to 4:30 p.m. Monday - Friday. Please note that voicemails left after 4:00 p.m. may not be returned until the following business day.  We are closed weekends and major holidays. You have access to a nurse at all times for urgent questions. Please call the main number to the clinic [phone] and follow the prompts. ? ?For any non-urgent questions, you may also contact your provider using MyChart. We now offer e-Visits for anyone 58 and older to request care online for non-urgent symptoms. For details visit [email or web address]. ?  ?Also download the MyChart app! Go to the app store, search "MyChart", open the app, select [facility], and log in with your MyChart username and password. ? ?Due to Covid, a mask is required upon entering the hospital/clinic. If you do not have a mask, one will be given to you upon arrival. For doctor visits, patients may have 1 support person aged 81 or older with them. For treatment visits, patients cannot have anyone with them due to current Covid guidelines and our immunocompromised population.  ?

## 2021-08-23 NOTE — Progress Notes (Signed)
Worsened redness/rash on patient face and chest.  No open areas or bleeding noted.  Dr [name] notified.  Seeing patient in the exam room.  Ok to treat per dr [name] ?

## 2021-08-29 ENCOUNTER — Other Ambulatory Visit (HOSPITAL_BASED_OUTPATIENT_CLINIC_OR_DEPARTMENT_OTHER): Payer: Self-pay

## 2021-08-29 MED ORDER — PREDNISONE 5 MG PO TABS
ORAL_TABLET | ORAL | 0 refills | Status: DC
  Filled 2021-08-29: qty 21, 6d supply, fill #0

## 2021-09-01 ENCOUNTER — Telehealth: Payer: Self-pay | Admitting: *Deleted

## 2021-09-01 NOTE — Telephone Encounter (Signed)
Call received from patient wanting to know if it is ok for her to use a TENS unit for her shoulder which was ordered by PT.  Pt notified that it is ok to use a TENS unit for her shoulder per order of Dr. [name].  Pt is appreciative of call and has no further questions at this time. ?

## 2021-09-06 ENCOUNTER — Inpatient Hospital Stay: Payer: 59

## 2021-09-06 ENCOUNTER — Inpatient Hospital Stay: Payer: 59 | Attending: Hematology & Oncology

## 2021-09-06 ENCOUNTER — Inpatient Hospital Stay (HOSPITAL_BASED_OUTPATIENT_CLINIC_OR_DEPARTMENT_OTHER): Payer: 59 | Admitting: Hematology & Oncology

## 2021-09-06 ENCOUNTER — Encounter: Payer: Self-pay | Admitting: *Deleted

## 2021-09-06 ENCOUNTER — Encounter: Payer: Self-pay | Admitting: Hematology & Oncology

## 2021-09-06 ENCOUNTER — Other Ambulatory Visit: Payer: Self-pay

## 2021-09-06 VITALS — BP 122/54 | HR 88 | Temp 98.0°F | Resp 18 | Ht 61.0 in | Wt 182.0 lb

## 2021-09-06 DIAGNOSIS — C19 Malignant neoplasm of rectosigmoid junction: Secondary | ICD-10-CM | POA: Insufficient documentation

## 2021-09-06 DIAGNOSIS — C787 Secondary malignant neoplasm of liver and intrahepatic bile duct: Secondary | ICD-10-CM | POA: Diagnosis not present

## 2021-09-06 DIAGNOSIS — C189 Malignant neoplasm of colon, unspecified: Secondary | ICD-10-CM

## 2021-09-06 DIAGNOSIS — R21 Rash and other nonspecific skin eruption: Secondary | ICD-10-CM | POA: Diagnosis not present

## 2021-09-06 DIAGNOSIS — Z5112 Encounter for antineoplastic immunotherapy: Secondary | ICD-10-CM | POA: Insufficient documentation

## 2021-09-06 LAB — CMP (CANCER CENTER ONLY)
ALT: 13 U/L (ref 0–44)
AST: 11 U/L — ABNORMAL LOW (ref 15–41)
Albumin: 4 g/dL (ref 3.5–5.0)
Alkaline Phosphatase: 74 U/L (ref 38–126)
Anion gap: 9 (ref 5–15)
BUN: 9 mg/dL (ref 6–20)
CO2: 27 mmol/L (ref 22–32)
Calcium: 9.4 mg/dL (ref 8.9–10.3)
Chloride: 104 mmol/L (ref 98–111)
Creatinine: 0.52 mg/dL (ref 0.44–1.00)
GFR, Estimated: >60 mL/min (ref >60)
Glucose, Bld: 94 mg/dL (ref 70–99)
Potassium: 3.7 mmol/L (ref 3.5–5.1)
Sodium: 140 mmol/L (ref 135–145)
Total Bilirubin: 0.3 mg/dL (ref 0.3–1.2)
Total Protein: 6.5 g/dL (ref 6.5–8.1)

## 2021-09-06 LAB — CBC WITH DIFFERENTIAL (CANCER CENTER ONLY)
Abs Immature Granulocytes: 0.05 10*3/uL (ref 0.00–0.07)
Basophils Absolute: 0 10*3/uL (ref 0.0–0.1)
Basophils Relative: 0 %
Eosinophils Absolute: 0.1 10*3/uL (ref 0.0–0.5)
Eosinophils Relative: 1 %
HCT: 40.2 % (ref 36.0–46.0)
Hemoglobin: 12.4 g/dL (ref 12.0–15.0)
Immature Granulocytes: 1 %
Lymphocytes Relative: 16 %
Lymphs Abs: 1.4 10*3/uL (ref 0.7–4.0)
MCH: 24.9 pg — ABNORMAL LOW (ref 26.0–34.0)
MCHC: 30.8 g/dL (ref 30.0–36.0)
MCV: 80.7 fL (ref 80.0–100.0)
Monocytes Absolute: 0.7 10*3/uL (ref 0.1–1.0)
Monocytes Relative: 9 %
Neutro Abs: 6.3 10*3/uL (ref 1.7–7.7)
Neutrophils Relative %: 73 %
Platelet Count: 248 10*3/uL (ref 150–400)
RBC: 4.98 MIL/uL (ref 3.87–5.11)
RDW: 18.3 % — ABNORMAL HIGH (ref 11.5–15.5)
WBC Count: 8.6 10*3/uL (ref 4.0–10.5)
nRBC: 0 % (ref 0.0–0.2)

## 2021-09-06 LAB — CEA (IN HOUSE-CHCC): CEA (CHCC-In House): 1.54 ng/mL (ref 0.00–5.00)

## 2021-09-06 LAB — LACTATE DEHYDROGENASE: LDH: 143 U/L (ref 98–192)

## 2021-09-06 LAB — FERRITIN: Ferritin: 20 ng/mL (ref 11–307)

## 2021-09-06 MED ORDER — HEPARIN SOD (PORK) LOCK FLUSH 100 UNIT/ML IV SOLN
500.0000 [IU] | Freq: Once | INTRAVENOUS | Status: AC | PRN
  Administered 2021-09-06: 500 [IU]

## 2021-09-06 MED ORDER — SODIUM CHLORIDE 0.9% FLUSH
10.0000 mL | INTRAVENOUS | Status: DC | PRN
  Administered 2021-09-06: 10 mL

## 2021-09-06 MED ORDER — SODIUM CHLORIDE 0.9 % IV SOLN: Freq: Once | INTRAVENOUS | Status: AC

## 2021-09-06 MED ORDER — SODIUM CHLORIDE 0.9 % IV SOLN
6.0000 mg/kg | Freq: Once | INTRAVENOUS | Status: AC
  Administered 2021-09-06: 500 mg via INTRAVENOUS
  Filled 2021-09-06: qty 20

## 2021-09-06 NOTE — Progress Notes (Signed)
?Hematology and Oncology Follow Up Visit ? ?Rebecca Bullock ?[id number] ?02/15/1966 56 y.o. ?09/06/2021 ? ? ?Principle Diagnosis:  ?Metastatic colorectal cancer-liver metastasis- BRAF (+) ?  ?Current Therapy:        ?Status post cycle 1 of chemotherapy with FOLFOXIRI/Avastin ?Encorafenib/Vectibix -- started on 06/19/2021, s/p cycle #4 ?  ?Interim History:  Rebecca Bullock is here today for follow-up.  She is still having problems with her left shoulder.  She has seen Orthopedic Surgery.  She had a steroid injection which really has not helped. ? ?She is on Vicodin right now which does seem to help a little bit.  She is doing little bit of physical therapy.  I think she goes back to see the Orthopedic Surgeon in few weeks. ? ?It sounds like there may be issues that she may need to have surgery for.  It sounds like she does have a frozen shoulder. ? ?As far as her cancer is concerned, she is doing quite well with this.  She does have the rash from the Vectibix.  She says is not all that bad right now. ? ?She has had no issues with nausea or vomiting.  She did have a nice vacation to the beach over [facility] Sunday weekend. ? ?Her last CEA level was 1.24 which is holding steady. ? ?Currently, I would say performance status is ECOG 1. ? ?Medications:  ?Allergies as of 09/06/2021   ? ?   Reactions  ? Irinotecan Other (See Comments)  ? Flushing, tingling, visual disturbance  ? Metronidazole Diarrhea  ? Vancomycin Rash  ? ?red syndrome  ? ?  ? ?  ?Medication List  ?  ? ?  ? Accurate as of September 06, 2021 11:54 AM. If you have any questions, ask your nurse or doctor.  ?  ?  ? ?  ? ?STOP taking these medications   ? ?predniSONE 5 MG tablet ?Commonly known as: DELTASONE ?Stopped by: [name], MD ?  ? ?  ? ?TAKE these medications   ? ?acetaminophen 500 MG tablet ?Commonly known as: TYLENOL ?Take 500-1,000 mg by mouth daily as needed for moderate pain or headache. ?  ?albuterol 108 (90 Base) MCG/ACT inhaler ?Commonly known as: VENTOLIN  HFA ?Inhale 2 puffs into the lungs every 6 (six) hours as needed. ?  ?betamethasone valerate 0.1 % cream ?Commonly known as: VALISONE ?Apply 1 application topically See admin instructions. Apply 1 application topically every other day as needed for psoriasis flare ?  ?encorafenib 75 MG capsule ?Commonly known as: BRAFTOVI ?Take 4 capsules (300 mg total) by mouth daily. ?  ?estradiol 0.1 MG/GM vaginal cream ?Commonly known as: ESTRACE ?Place 1 Applicatorful vaginally daily as needed (dryness / irritation). ?  ?fluticasone 50 MCG/ACT nasal spray ?Commonly known as: FLONASE ?Place 2 sprays into both nostrils daily. ?  ?gabapentin 100 MG capsule ?Commonly known as: NEURONTIN ?Take 1 capsule (100 mg total) by mouth at bedtime for 2 days, THEN 1 capsule (100 mg total) 2 (two) times daily for 2 days, THEN 1 capsule (100 mg total) 3 (three) times daily for 10 days. ?Start taking on: August 04, 2021 ?  ?HYDROcodone-acetaminophen 5-325 MG tablet ?Commonly known as: NORCO/VICODIN ?Take 1 tablet by mouth every 4 (four) hours as needed for moderate pain. ?  ?hydrocortisone 1 % lotion ?Apply 1 application topically 2 (two) times daily as needed for itching. ?  ?meloxicam 15 MG tablet ?Commonly known as: MOBIC ?Take 1 tablet (15 mg total) by mouth daily. ?  ?  montelukast 10 MG tablet ?Commonly known as: SINGULAIR ?Take 1 tablet (10 mg total) by mouth at bedtime. ?  ?omeprazole 20 MG tablet ?Commonly known as: PRILOSEC OTC ?Take 20 mg by mouth daily as needed (acid reflux). ?  ? ?  ? ? ?Allergies:  ?Allergies  ?Allergen Reactions  ? Irinotecan Other (See Comments)  ?  Flushing, tingling, visual disturbance  ? Metronidazole Diarrhea  ? Vancomycin Rash  ?  ?red syndrome  ? ? ?Past Medical History, Surgical history, Social history, and Family History were reviewed and updated. ? ?Review of Systems: ?Review of Systems  ?Constitutional: Negative.   ?HENT: Negative.    ?Eyes: Negative.   ?Respiratory: Negative.    ?Cardiovascular:  Negative.   ?Gastrointestinal: Negative.   ?Genitourinary: Negative.   ?Musculoskeletal: Negative.   ?Skin:  Positive for rash.  ?Neurological: Negative.   ?Endo/Heme/Allergies: Negative.   ?Psychiatric/Behavioral: Negative.    ? ? ?Physical Exam: ? height is '5\' 1"'  (1.549 m) and weight is 182 lb (82.6 kg). Her oral temperature is 98 ?F (36.7 ?C). Her blood pressure is 122/54 (abnormal) and her pulse is 88. Her respiration is 18 and oxygen saturation is 100%.  ? ?Wt Readings from Last 3 Encounters:  ?09/06/21 182 lb (82.6 kg)  ?08/09/21 180 lb (81.6 kg)  ?08/09/21 180 lb (81.6 kg)  ? ? ?Physical Exam ?Vitals reviewed.  ?HENT:  ?   Head: Normocephalic and atraumatic.  ?Eyes:  ?   Pupils: Pupils are equal, round, and reactive to light.  ?Cardiovascular:  ?   Rate and Rhythm: Normal rate and regular rhythm.  ?   Heart sounds: Normal heart sounds.  ?Pulmonary:  ?   Effort: Pulmonary effort is normal.  ?   Breath sounds: Normal breath sounds.  ?Abdominal:  ?   General: Bowel sounds are normal.  ?   Palpations: Abdomen is soft.  ?Musculoskeletal:     ?   General: No tenderness or deformity. Normal range of motion.  ?   Cervical back: Normal range of motion.  ?Lymphadenopathy:  ?   Cervical: No cervical adenopathy.  ?Skin: ?   General: Skin is warm and dry.  ?   Findings: No erythema or rash.  ?Neurological:  ?   Mental Status: She is alert and oriented to person, place, and time.  ?Psychiatric:     ?   Behavior: Behavior normal.     ?   Thought Content: Thought content normal.     ?   Judgment: Judgment normal.  ? ? ? ?Lab Results  ?Component Value Date  ? WBC 8.6 09/06/2021  ? HGB 12.4 09/06/2021  ? HCT 40.2 09/06/2021  ? MCV 80.7 09/06/2021  ? PLT 248 09/06/2021  ? ?Lab Results  ?Component Value Date  ? FERRITIN 44 08/09/2021  ? IRON 50 08/09/2021  ? TIBC 343 08/09/2021  ? UIBC 293 08/09/2021  ? IRONPCTSAT 15 08/09/2021  ? ?Lab Results  ?Component Value Date  ? RETICCTPCT 0.7 04/24/2021  ? RBC 4.98 09/06/2021  ? ?No  results found for: KPAFRELGTCHN, LAMBDASER, KAPLAMBRATIO ?No results found for: IGGSERUM, IGA, IGMSERUM ?No results found for: TOTALPROTELP, ALBUMINELP, A1GS, A2GS, BETS, BETA2SER, GAMS, MSPIKE, SPEI ?  Chemistry   ?   ?Component Value Date/Time  ? NA 140 09/06/2021 1000  ? K 3.7 09/06/2021 1000  ? CL 104 09/06/2021 1000  ? CO2 27 09/06/2021 1000  ? BUN 9 09/06/2021 1000  ? CREATININE 0.52 09/06/2021 1000  ? CREATININE 0.56  03/23/2021 0000  ?    ?Component Value Date/Time  ? CALCIUM 9.4 09/06/2021 1000  ? ALKPHOS 74 09/06/2021 1000  ? AST 11 (L) 09/06/2021 1000  ? ALT 13 09/06/2021 1000  ? BILITOT 0.3 09/06/2021 1000  ?  ? ? ? ?Impression and Plan: Ms. Melchor is a very pleasant 56 yo caucasian female with metastatic colon cancer.  She has a liver met that is quite large.  She has some adenopathy. ? ?We will continue her on the targeted therapy.  So far, she has responded quite nicely. ? ?I would not do another PET scan on her probably until June.  At that point, then we may have to think about some type of targeted liver approach for her disease. ? ?I really hope that her left shoulder gets better.  I just hate the fact that this is causing so much problem for her. ? ?  ? ?[name], MD ?4/12/202311:54 AM ? ?

## 2021-09-06 NOTE — Patient Instructions (Signed)
[facility]  Discharge Instructions: ?Thank you for choosing [facility] to provide your oncology and hematology care.  ? ?If you have a lab appointment with the [facility], please go directly to the [facility] and check in at the registration area. ? ?Wear comfortable clothing and clothing appropriate for easy access to any Portacath or PICC line.  ? ?We strive to give you quality time with your provider. You may need to reschedule your appointment if you arrive late (15 or more minutes).  Arriving late affects you and other patients whose appointments are after yours.  Also, if you miss three or more appointments without notifying the office, you may be dismissed from the clinic at the provider?s discretion.    ?  ?For prescription refill requests, have your pharmacy contact our office and allow 72 hours for refills to be completed.   ? ?Today you received the following chemotherapy and/or immunotherapy agents Vectibix    ?  ?To help prevent nausea and vomiting after your treatment, we encourage you to take your nausea medication as directed. ? ?BELOW ARE SYMPTOMS THAT SHOULD BE REPORTED IMMEDIATELY: ?*FEVER GREATER THAN 100.4 F (38 ?C) OR HIGHER ?*CHILLS OR SWEATING ?*NAUSEA AND VOMITING THAT IS NOT CONTROLLED WITH YOUR NAUSEA MEDICATION ?*UNUSUAL SHORTNESS OF BREATH ?*UNUSUAL BRUISING OR BLEEDING ?*URINARY PROBLEMS (pain or burning when urinating, or frequent urination) ?*BOWEL PROBLEMS (unusual diarrhea, constipation, pain near the anus) ?TENDERNESS IN MOUTH AND THROAT WITH OR WITHOUT PRESENCE OF ULCERS (sore throat, sores in mouth, or a toothache) ?UNUSUAL RASH, SWELLING OR PAIN  ?UNUSUAL VAGINAL DISCHARGE OR ITCHING  ? ?Items with * indicate a potential emergency and should be followed up as soon as possible or go to the Emergency Department if any problems should occur. ? ?Please show the CHEMOTHERAPY ALERT CARD or IMMUNOTHERAPY ALERT CARD at check-in to the  Emergency Department and triage nurse. ?Should you have questions after your visit or need to cancel or reschedule your appointment, please contact [facility]  [phone] and follow the prompts.  Office hours are 8:00 a.m. to 4:30 p.m. Monday - Friday. Please note that voicemails left after 4:00 p.m. may not be returned until the following business day.  We are closed weekends and major holidays. You have access to a nurse at all times for urgent questions. Please call the main number to the clinic [phone] and follow the prompts. ? ?For any non-urgent questions, you may also contact your provider using MyChart. We now offer e-Visits for anyone 55 and older to request care online for non-urgent symptoms. For details visit [email or web address]. ?  ?Also download the MyChart app! Go to the app store, search "MyChart", open the app, select [facility], and log in with your MyChart username and password. ? ?Due to Covid, a mask is required upon entering the hospital/clinic. If you do not have a mask, one will be given to you upon arrival. For doctor visits, patients may have 1 support person aged 23 or older with them. For treatment visits, patients cannot have anyone with them due to current Covid guidelines and our immunocompromised population.  ?

## 2021-09-06 NOTE — Progress Notes (Signed)
Patient is doing well with her treatment. She has the continued rash to her face but this is manageable. Her shoulder continues to cause her problems, but at this time she doesn't believe it's a surgical issue.  ? ?Oncology Nurse Navigator Documentation ? ? ?  09/06/2021  ? 11:15 AM  ?Oncology Nurse Navigator Flowsheets  ?Navigator Follow Up Date: 10/04/2021  ?Navigator Follow Up Reason: Follow-up Appointment;Chemotherapy  ?Navigator Location [facility]  ?Navigator Encounter Type Telephone;Appt/Treatment Plan Review  ?Patient Visit Type MedOnc  ?Treatment Phase Active Tx  ?Barriers/Navigation Needs Coordination of Care;Education  ?Interventions Psycho-Social Support  ?Acuity Level 2-Minimal Needs (1-2 Barriers Identified)  ?Support Groups/Services Friends and Family  ?Time Spent with Patient 15  ?  ?

## 2021-09-06 NOTE — Patient Instructions (Signed)

## 2021-09-07 ENCOUNTER — Encounter: Payer: Self-pay | Admitting: Family

## 2021-09-07 ENCOUNTER — Encounter: Payer: Self-pay | Admitting: Hematology & Oncology

## 2021-09-13 ENCOUNTER — Telehealth: Payer: Self-pay | Admitting: *Deleted

## 2021-09-13 NOTE — Telephone Encounter (Signed)
Called Meiah to let her know that Dr [name] just got off the phone with dr Rhona Raider and will be calling the patient to get her in to assess her shoulder.  Patient appreciates call ?

## 2021-09-19 ENCOUNTER — Other Ambulatory Visit: Payer: Self-pay

## 2021-09-19 DIAGNOSIS — C189 Malignant neoplasm of colon, unspecified: Secondary | ICD-10-CM

## 2021-09-20 ENCOUNTER — Inpatient Hospital Stay: Payer: 59

## 2021-09-20 DIAGNOSIS — C189 Malignant neoplasm of colon, unspecified: Secondary | ICD-10-CM

## 2021-09-20 DIAGNOSIS — Z5112 Encounter for antineoplastic immunotherapy: Secondary | ICD-10-CM | POA: Diagnosis not present

## 2021-09-20 LAB — CMP (CANCER CENTER ONLY)
ALT: 9 U/L (ref 0–44)
AST: 10 U/L — ABNORMAL LOW (ref 15–41)
Albumin: 3.9 g/dL (ref 3.5–5.0)
Alkaline Phosphatase: 68 U/L (ref 38–126)
Anion gap: 9 (ref 5–15)
BUN: 6 mg/dL (ref 6–20)
CO2: 26 mmol/L (ref 22–32)
Calcium: 8.9 mg/dL (ref 8.9–10.3)
Chloride: 106 mmol/L (ref 98–111)
Creatinine: 0.46 mg/dL (ref 0.44–1.00)
GFR, Estimated: >60 mL/min (ref >60)
Glucose, Bld: 134 mg/dL — ABNORMAL HIGH (ref 70–99)
Potassium: 3.4 mmol/L — ABNORMAL LOW (ref 3.5–5.1)
Sodium: 141 mmol/L (ref 135–145)
Total Bilirubin: 0.2 mg/dL — ABNORMAL LOW (ref 0.3–1.2)
Total Protein: 6.6 g/dL (ref 6.5–8.1)

## 2021-09-20 LAB — CBC WITH DIFFERENTIAL (CANCER CENTER ONLY)
Abs Immature Granulocytes: 0.02 10*3/uL (ref 0.00–0.07)
Basophils Absolute: 0 10*3/uL (ref 0.0–0.1)
Basophils Relative: 0 %
Eosinophils Absolute: 0.1 10*3/uL (ref 0.0–0.5)
Eosinophils Relative: 2 %
HCT: 35.4 % — ABNORMAL LOW (ref 36.0–46.0)
Hemoglobin: 11.1 g/dL — ABNORMAL LOW (ref 12.0–15.0)
Immature Granulocytes: 0 %
Lymphocytes Relative: 19 %
Lymphs Abs: 1.2 10*3/uL (ref 0.7–4.0)
MCH: 25.3 pg — ABNORMAL LOW (ref 26.0–34.0)
MCHC: 31.4 g/dL (ref 30.0–36.0)
MCV: 80.6 fL (ref 80.0–100.0)
Monocytes Absolute: 0.5 10*3/uL (ref 0.1–1.0)
Monocytes Relative: 7 %
Neutro Abs: 4.5 10*3/uL (ref 1.7–7.7)
Neutrophils Relative %: 72 %
Platelet Count: 221 10*3/uL (ref 150–400)
RBC: 4.39 MIL/uL (ref 3.87–5.11)
RDW: 16.8 % — ABNORMAL HIGH (ref 11.5–15.5)
WBC Count: 6.3 10*3/uL (ref 4.0–10.5)
nRBC: 0 % (ref 0.0–0.2)

## 2021-09-20 LAB — MAGNESIUM: Magnesium: 1.6 mg/dL — ABNORMAL LOW (ref 1.7–2.4)

## 2021-09-20 MED ORDER — SODIUM CHLORIDE 0.9 % IV SOLN
6.0000 mg/kg | Freq: Once | INTRAVENOUS | Status: AC
  Administered 2021-09-20: 500 mg via INTRAVENOUS
  Filled 2021-09-20: qty 20

## 2021-09-20 MED ORDER — SODIUM CHLORIDE 0.9 % IV SOLN: Freq: Once | INTRAVENOUS | Status: AC

## 2021-09-20 MED ORDER — SODIUM CHLORIDE 0.9% FLUSH
10.0000 mL | INTRAVENOUS | Status: DC | PRN
  Administered 2021-09-20: 10 mL

## 2021-09-20 MED ORDER — HEPARIN SOD (PORK) LOCK FLUSH 100 UNIT/ML IV SOLN
500.0000 [IU] | Freq: Once | INTRAVENOUS | Status: AC | PRN
  Administered 2021-09-20: 500 [IU]

## 2021-09-20 NOTE — Patient Instructions (Signed)
[facility]  Discharge Instructions: ?Thank you for choosing [facility] to provide your oncology and hematology care.  ? ?If you have a lab appointment with the [facility], please go directly to the [facility] and check in at the registration area. ? ?Wear comfortable clothing and clothing appropriate for easy access to any Portacath or PICC line.  ? ?We strive to give you quality time with your provider. You may need to reschedule your appointment if you arrive late (15 or more minutes).  Arriving late affects you and other patients whose appointments are after yours.  Also, if you miss three or more appointments without notifying the office, you may be dismissed from the clinic at the provider?s discretion.    ?  ?For prescription refill requests, have your pharmacy contact our office and allow 72 hours for refills to be completed.   ? ?Today you received the following chemotherapy and/or immunotherapy agents Vectibix ?    ?  ?To help prevent nausea and vomiting after your treatment, we encourage you to take your nausea medication as directed. ? ?BELOW ARE SYMPTOMS THAT SHOULD BE REPORTED IMMEDIATELY: ?*FEVER GREATER THAN 100.4 F (38 ?C) OR HIGHER ?*CHILLS OR SWEATING ?*NAUSEA AND VOMITING THAT IS NOT CONTROLLED WITH YOUR NAUSEA MEDICATION ?*UNUSUAL SHORTNESS OF BREATH ?*UNUSUAL BRUISING OR BLEEDING ?*URINARY PROBLEMS (pain or burning when urinating, or frequent urination) ?*BOWEL PROBLEMS (unusual diarrhea, constipation, pain near the anus) ?TENDERNESS IN MOUTH AND THROAT WITH OR WITHOUT PRESENCE OF ULCERS (sore throat, sores in mouth, or a toothache) ?UNUSUAL RASH, SWELLING OR PAIN  ?UNUSUAL VAGINAL DISCHARGE OR ITCHING  ? ?Items with * indicate a potential emergency and should be followed up as soon as possible or go to the Emergency Department if any problems should occur. ? ?Please show the CHEMOTHERAPY ALERT CARD or IMMUNOTHERAPY ALERT CARD at check-in to the  Emergency Department and triage nurse. ?Should you have questions after your visit or need to cancel or reschedule your appointment, please contact [facility]  [phone] and follow the prompts.  Office hours are 8:00 a.m. to 4:30 p.m. Monday - Friday. Please note that voicemails left after 4:00 p.m. may not be returned until the following business day.  We are closed weekends and major holidays. You have access to a nurse at all times for urgent questions. Please call the main number to the clinic [phone] and follow the prompts. ? ?For any non-urgent questions, you may also contact your provider using MyChart. We now offer e-Visits for anyone 63 and older to request care online for non-urgent symptoms. For details visit [email or web address]. ?  ?Also download the MyChart app! Go to the app store, search "MyChart", open the app, select [facility], and log in with your MyChart username and password. ? ?Due to Covid, a mask is required upon entering the hospital/clinic. If you do not have a mask, one will be given to you upon arrival. For doctor visits, patients may have 1 support person aged 19 or older with them. For treatment visits, patients cannot have anyone with them due to current Covid guidelines and our immunocompromised population.  ? ?

## 2021-09-20 NOTE — Progress Notes (Signed)
Labs reviewed with MD, VO " ok to treat despite counts" ?

## 2021-09-20 NOTE — Patient Instructions (Signed)

## 2021-09-21 ENCOUNTER — Other Ambulatory Visit (HOSPITAL_BASED_OUTPATIENT_CLINIC_OR_DEPARTMENT_OTHER): Payer: Self-pay

## 2021-09-21 MED ORDER — TRAMADOL HCL 50 MG PO TABS
ORAL_TABLET | ORAL | 0 refills | Status: DC
  Filled 2021-09-21: qty 30, 10d supply, fill #0

## 2021-09-26 ENCOUNTER — Telehealth: Payer: Self-pay | Admitting: *Deleted

## 2021-09-26 NOTE — Telephone Encounter (Signed)
Call received from patient requesting for her med list and last office note to be faxed to the [facility] at [phone] for her upcoming surgery.  Records faxed per pt.'s request. ?

## 2021-09-28 ENCOUNTER — Other Ambulatory Visit (HOSPITAL_BASED_OUTPATIENT_CLINIC_OR_DEPARTMENT_OTHER): Payer: Self-pay

## 2021-09-28 MED ORDER — OXYCODONE-ACETAMINOPHEN 5-325 MG PO TABS
ORAL_TABLET | ORAL | 0 refills | Status: DC
  Filled 2021-09-28: qty 20, 3d supply, fill #0

## 2021-10-04 ENCOUNTER — Inpatient Hospital Stay: Payer: 59 | Attending: Hematology & Oncology

## 2021-10-04 ENCOUNTER — Encounter: Payer: Self-pay | Admitting: Hematology & Oncology

## 2021-10-04 ENCOUNTER — Inpatient Hospital Stay: Payer: 59

## 2021-10-04 ENCOUNTER — Inpatient Hospital Stay (HOSPITAL_BASED_OUTPATIENT_CLINIC_OR_DEPARTMENT_OTHER): Payer: 59 | Admitting: Hematology & Oncology

## 2021-10-04 ENCOUNTER — Other Ambulatory Visit: Payer: Self-pay

## 2021-10-04 VITALS — BP 141/73 | HR 67 | Temp 97.9°F | Resp 18 | Ht 61.0 in | Wt 181.1 lb

## 2021-10-04 DIAGNOSIS — C189 Malignant neoplasm of colon, unspecified: Secondary | ICD-10-CM

## 2021-10-04 DIAGNOSIS — C787 Secondary malignant neoplasm of liver and intrahepatic bile duct: Secondary | ICD-10-CM | POA: Diagnosis not present

## 2021-10-04 DIAGNOSIS — Z79899 Other long term (current) drug therapy: Secondary | ICD-10-CM | POA: Diagnosis not present

## 2021-10-04 DIAGNOSIS — C19 Malignant neoplasm of rectosigmoid junction: Secondary | ICD-10-CM | POA: Insufficient documentation

## 2021-10-04 DIAGNOSIS — C182 Malignant neoplasm of ascending colon: Secondary | ICD-10-CM

## 2021-10-04 DIAGNOSIS — E559 Vitamin D deficiency, unspecified: Secondary | ICD-10-CM

## 2021-10-04 DIAGNOSIS — Z5112 Encounter for antineoplastic immunotherapy: Secondary | ICD-10-CM | POA: Diagnosis present

## 2021-10-04 LAB — CMP (CANCER CENTER ONLY)
ALT: 9 U/L (ref 0–44)
AST: 11 U/L — ABNORMAL LOW (ref 15–41)
Albumin: 4.1 g/dL (ref 3.5–5.0)
Alkaline Phosphatase: 77 U/L (ref 38–126)
Anion gap: 10 (ref 5–15)
BUN: 8 mg/dL (ref 6–20)
CO2: 27 mmol/L (ref 22–32)
Calcium: 9.5 mg/dL (ref 8.9–10.3)
Chloride: 104 mmol/L (ref 98–111)
Creatinine: 0.44 mg/dL (ref 0.44–1.00)
GFR, Estimated: >60 mL/min (ref >60)
Glucose, Bld: 150 mg/dL — ABNORMAL HIGH (ref 70–99)
Potassium: 3.3 mmol/L — ABNORMAL LOW (ref 3.5–5.1)
Sodium: 141 mmol/L (ref 135–145)
Total Bilirubin: 0.2 mg/dL — ABNORMAL LOW (ref 0.3–1.2)
Total Protein: 6.5 g/dL (ref 6.5–8.1)

## 2021-10-04 LAB — CBC WITH DIFFERENTIAL (CANCER CENTER ONLY)
Abs Immature Granulocytes: 0.03 10*3/uL (ref 0.00–0.07)
Basophils Absolute: 0 10*3/uL (ref 0.0–0.1)
Basophils Relative: 0 %
Eosinophils Absolute: 0.1 10*3/uL (ref 0.0–0.5)
Eosinophils Relative: 2 %
HCT: 35.5 % — ABNORMAL LOW (ref 36.0–46.0)
Hemoglobin: 11.1 g/dL — ABNORMAL LOW (ref 12.0–15.0)
Immature Granulocytes: 0 %
Lymphocytes Relative: 16 %
Lymphs Abs: 1.1 10*3/uL (ref 0.7–4.0)
MCH: 24.9 pg — ABNORMAL LOW (ref 26.0–34.0)
MCHC: 31.3 g/dL (ref 30.0–36.0)
MCV: 79.8 fL — ABNORMAL LOW (ref 80.0–100.0)
Monocytes Absolute: 0.5 10*3/uL (ref 0.1–1.0)
Monocytes Relative: 8 %
Neutro Abs: 5 10*3/uL (ref 1.7–7.7)
Neutrophils Relative %: 74 %
Platelet Count: 248 10*3/uL (ref 150–400)
RBC: 4.45 MIL/uL (ref 3.87–5.11)
RDW: 15.9 % — ABNORMAL HIGH (ref 11.5–15.5)
WBC Count: 6.7 10*3/uL (ref 4.0–10.5)
nRBC: 0 % (ref 0.0–0.2)

## 2021-10-04 LAB — LACTATE DEHYDROGENASE: LDH: 157 U/L (ref 98–192)

## 2021-10-04 LAB — RETICULOCYTES
Immature Retic Fract: 13.5 % (ref 2.3–15.9)
RBC.: 4.37 MIL/uL (ref 3.87–5.11)
Retic Count, Absolute: 68.6 10*3/uL (ref 19.0–186.0)
Retic Ct Pct: 1.6 % (ref 0.4–3.1)

## 2021-10-04 LAB — CEA (IN HOUSE-CHCC): CEA (CHCC-In House): 2.07 ng/mL (ref 0.00–5.00)

## 2021-10-04 LAB — MAGNESIUM: Magnesium: 1.5 mg/dL — ABNORMAL LOW (ref 1.7–2.4)

## 2021-10-04 LAB — TSH: TSH: 0.808 u[IU]/mL (ref 0.350–4.500)

## 2021-10-04 LAB — FERRITIN: Ferritin: 9 ng/mL — ABNORMAL LOW (ref 11–307)

## 2021-10-04 MED ORDER — SODIUM CHLORIDE 0.9 % IV SOLN: Freq: Once | INTRAVENOUS | Status: AC

## 2021-10-04 MED ORDER — SODIUM CHLORIDE 0.9 % IV SOLN
6.0000 mg/kg | Freq: Once | INTRAVENOUS | Status: AC
  Administered 2021-10-04: 500 mg via INTRAVENOUS
  Filled 2021-10-04: qty 20

## 2021-10-04 MED ORDER — HEPARIN SOD (PORK) LOCK FLUSH 100 UNIT/ML IV SOLN
500.0000 [IU] | Freq: Once | INTRAVENOUS | Status: AC | PRN
  Administered 2021-10-04: 500 [IU]

## 2021-10-04 MED ORDER — SODIUM CHLORIDE 0.9% FLUSH
10.0000 mL | INTRAVENOUS | Status: DC | PRN
  Administered 2021-10-04: 10 mL

## 2021-10-04 NOTE — Patient Instructions (Signed)

## 2021-10-04 NOTE — Patient Instructions (Signed)
Panitumumab Solution for Injection ?What is this medication? ?PANITUMUMAB (pan i TOOM ue mab) is a monoclonal antibody. It is used to treat colorectal cancer. ?This medicine may be used for other purposes; ask your health care provider or pharmacist if you have questions. ?COMMON BRAND NAME(S): Vectibix ?What should I tell my care team before I take this medication? ?They need to know if you have any of these conditions: ?eye disease, vision problems ?low levels of calcium, magnesium, or potassium in the blood ?lung or breathing disease, like asthma ?skin conditions or sensitivity ?an unusual or allergic reaction to panitumumab, other medicines, foods, dyes, or preservatives ?pregnant or trying to get pregnant ?breast-feeding ?How should I use this medication? ?This drug is given as an infusion into a vein. It is administered in a hospital or clinic by a specially trained health care professional. ?Talk to your pediatrician regarding the use of this medicine in children. Special care may be needed. ?Overdosage: If you think you have taken too much of this medicine contact a poison control center or emergency room at once. ?NOTE: This medicine is only for you. Do not share this medicine with others. ?What if I miss a dose? ?It is important not to miss your dose. Call your doctor or health care professional if you are unable to keep an appointment. ?What may interact with this medication? ?Do not take this medicine with any of the following medications: ?bevacizumab ?This list may not describe all possible interactions. Give your health care provider a list of all the medicines, herbs, non-prescription drugs, or dietary supplements you use. Also tell them if you smoke, drink alcohol, or use illegal drugs. Some items may interact with your medicine. ?What should I watch for while using this medication? ?Visit your doctor for checks on your progress. This drug may make you feel generally unwell. This is not uncommon, as  chemotherapy can affect healthy cells as well as cancer cells. Report any side effects. Continue your course of treatment even though you feel ill unless your doctor tells you to stop. ?This medicine can make you more sensitive to the sun. Keep out of the sun while receiving this medicine and for 2 months after the last dose. If you cannot avoid being in the sun, wear protective clothing and use sunscreen. Do not use sun lamps or tanning beds/booths. ?In some cases, you may be given additional medicines to help with side effects. Follow all directions for their use. ?Call your doctor or health care professional for advice if you get a fever, chills or sore throat, or other symptoms of a cold or flu. Do not treat yourself. This drug decreases your body's ability to fight infections. Try to avoid being around people who are sick. ?Avoid taking products that contain aspirin, acetaminophen, ibuprofen, naproxen, or ketoprofen unless instructed by your doctor. These medicines may hide a fever. ?Do not become pregnant while taking this medicine and for 2 months after the last dose. Women should inform their doctor if they wish to become pregnant or think they might be pregnant. There is a potential for serious side effects to an unborn child. Talk to your health care professional or pharmacist for more information. Do not breast-feed an infant while taking this medicine or for 2 months after the last dose. ?What side effects may I notice from receiving this medication? ?Side effects that you should report to your doctor or health care professional as soon as possible: ?allergic reactions like skin rash, itching or  hives, swelling of the face, lips, or tongue ?breathing problems ?changes in vision ?eye pain ?fast, irregular heartbeat ?fever, chills ?mouth sores ?red spots on the skin ?redness, blistering, peeling or loosening of the skin, including inside the mouth ?signs and symptoms of kidney injury like trouble passing  urine or change in the amount of urine ?signs and symptoms of low blood pressure like dizziness; feeling faint or lightheaded, falls; unusually weak or tired ?signs of low calcium like fast heartbeat, muscle cramps or muscle pain; pain, tingling, numbness in the hands or feet; seizures ?signs and symptoms of low magnesium like muscle cramps, pain, or weakness; tremors; seizures; or fast, irregular heartbeat ?signs and symptoms of low potassium like muscle cramps or muscle pain; chest pain; dizziness; feeling faint or lightheaded, falls; palpitations; breathing problems; or fast, irregular heartbeat ?swelling of the ankles, feet, hands ?Side effects that usually do not require medical attention (report to your doctor or health care professional if they continue or are bothersome): ?changes in skin like acne, cracks, skin dryness ?diarrhea ?eyelash growth ?headache ?mouth sores ?nail changes ?nausea, vomiting ?This list may not describe all possible side effects. Call your doctor for medical advice about side effects. You may report side effects to FDA at 1-800-FDA-1088. ?Where should I keep my medication? ?This drug is given in a hospital or clinic and will not be stored at home. ?NOTE: This sheet is a summary. It may not cover all possible information. If you have questions about this medicine, talk to your doctor, pharmacist, or health care provider. ?? 2023 Elsevier/Gold Standard (2015-12-09 00:00:00) ? ?

## 2021-10-04 NOTE — Progress Notes (Signed)
?Hematology and Oncology Follow Up Visit ? ?Rebecca Bullock ?[id number] ?19-Jan-1966 56 y.o. ?10/04/2021 ? ? ?Principle Diagnosis:  ?Metastatic colorectal cancer-liver metastasis- BRAF (+) ?  ?Current Therapy:        ?Status post cycle 1 of chemotherapy with FOLFOXIRI/Avastin ?Encorafenib/Vectibix -- started on 06/19/2021, s/p cycle #5 ?  ?Interim History:  Rebecca Bullock is here today for follow-up.  She had her left shoulder surgery about 10 days ago.  I was apprised as to how well she is doing.  She is not in a sling.  She is going to start physical therapy.  She is still having quite a bit of pain but hopefully this will improve. ? ?She is tolerated the encorafinib in the Vectibix pretty well.  She has lost some hair.  Is hard to say what this might be from.  We will check a TSH on her today. ? ?She has had no problems with bowels or bladder.  She has had no bleeding.  She has had no cough or shortness of breath. ? ? ?Her last CEA level back in April was 1.54. ? ?She has had no leg swelling.  There is been no rashes outside of that on her face from the Vectibix. ? ?Overall, I would say her performance status is probably ECOG 1. ? ?Medications:  ?Allergies as of 10/04/2021   ? ?   Reactions  ? Irinotecan Other (See Comments)  ? Flushing, tingling, visual disturbance  ? Metronidazole Diarrhea  ? Vancomycin Rash  ? ?red syndrome  ? ?  ? ?  ?Medication List  ?  ? ?  ? Accurate as of Oct 04, 2021  1:39 PM. If you have any questions, ask your nurse or doctor.  ?  ?  ? ?  ? ?STOP taking these medications   ? ?albuterol 108 (90 Base) MCG/ACT inhaler ?Commonly known as: VENTOLIN HFA ?Stopped by: [name], MD ?  ?gabapentin 100 MG capsule ?Commonly known as: NEURONTIN ?Stopped by: [name], MD ?  ?meloxicam 15 MG tablet ?Commonly known as: MOBIC ?Stopped by: [name], MD ?  ?montelukast 10 MG tablet ?Commonly known as: SINGULAIR ?Stopped by: [name], MD ?  ?oxyCODONE-acetaminophen 5-325 MG  tablet ?Commonly known as: Percocet ?Stopped by: [name], MD ?  ?traMADol 50 MG tablet ?Commonly known as: ULTRAM ?Stopped by: [name], MD ?  ? ?  ? ?TAKE these medications   ? ?acetaminophen 500 MG tablet ?Commonly known as: TYLENOL ?Take 500-1,000 mg by mouth daily as needed for moderate pain or headache. ?  ?betamethasone valerate 0.1 % cream ?Commonly known as: VALISONE ?Apply 1 application topically See admin instructions. Apply 1 application topically every other day as needed for psoriasis flare ?  ?encorafenib 75 MG capsule ?Commonly known as: BRAFTOVI ?Take 4 capsules (300 mg total) by mouth daily. ?  ?estradiol 0.1 MG/GM vaginal cream ?Commonly known as: ESTRACE ?Place 1 Applicatorful vaginally daily as needed (dryness / irritation). ?  ?fluticasone 50 MCG/ACT nasal spray ?Commonly known as: FLONASE ?Place 2 sprays into both nostrils daily. ?  ?HYDROcodone-acetaminophen 5-325 MG tablet ?Commonly known as: NORCO/VICODIN ?Take 1 tablet by mouth every 4 (four) hours as needed for moderate pain. ?  ?hydrocortisone 1 % lotion ?Apply 1 application topically 2 (two) times daily as needed for itching. ?  ?omeprazole 20 MG tablet ?Commonly known as: PRILOSEC OTC ?Take 20 mg by mouth daily as needed (acid reflux). ?  ? ?  ? ? ?  Allergies:  ?Allergies  ?Allergen Reactions  ? Irinotecan Other (See Comments)  ?  Flushing, tingling, visual disturbance  ? Metronidazole Diarrhea  ? Vancomycin Rash  ?  ?red syndrome  ? ? ?Past Medical History, Surgical history, Social history, and Family History were reviewed and updated. ? ?Review of Systems: ?Review of Systems  ?Constitutional: Negative.   ?HENT: Negative.    ?Eyes: Negative.   ?Respiratory: Negative.    ?Cardiovascular: Negative.   ?Gastrointestinal: Negative.   ?Genitourinary: Negative.   ?Musculoskeletal: Negative.   ?Skin:  Positive for rash.  ?Neurological: Negative.   ?Endo/Heme/Allergies: Negative.   ?Psychiatric/Behavioral: Negative.     ? ? ?Physical Exam: ? height is '5\' 1"'  (1.549 m) and weight is 181 lb 1.3 oz (82.1 kg). Her oral temperature is 97.9 ?F (36.6 ?C). Her blood pressure is 141/73 (abnormal) and her pulse is 67. Her respiration is 18 and oxygen saturation is 99%.  ? ?Wt Readings from Last 3 Encounters:  ?10/04/21 181 lb 1.3 oz (82.1 kg)  ?09/06/21 182 lb (82.6 kg)  ?08/09/21 180 lb (81.6 kg)  ? ? ?Physical Exam ?Vitals reviewed.  ?HENT:  ?   Head: Normocephalic and atraumatic.  ?Eyes:  ?   Pupils: Pupils are equal, round, and reactive to light.  ?Cardiovascular:  ?   Rate and Rhythm: Normal rate and regular rhythm.  ?   Heart sounds: Normal heart sounds.  ?Pulmonary:  ?   Effort: Pulmonary effort is normal.  ?   Breath sounds: Normal breath sounds.  ?Abdominal:  ?   General: Bowel sounds are normal.  ?   Palpations: Abdomen is soft.  ?Musculoskeletal:     ?   General: No tenderness or deformity. Normal range of motion.  ?   Cervical back: Normal range of motion.  ?   Comments: She has arthroscopic wounds in the left shoulder.  She has sutures.  There is no erythema.  She has markedly limited range of motion of the left shoulder.  ?Lymphadenopathy:  ?   Cervical: No cervical adenopathy.  ?Skin: ?   General: Skin is warm and dry.  ?   Findings: No erythema or rash.  ?Neurological:  ?   Mental Status: She is alert and oriented to person, place, and time.  ?Psychiatric:     ?   Behavior: Behavior normal.     ?   Thought Content: Thought content normal.     ?   Judgment: Judgment normal.  ? ? ? ?Lab Results  ?Component Value Date  ? WBC 6.7 10/04/2021  ? HGB 11.1 (L) 10/04/2021  ? HCT 35.5 (L) 10/04/2021  ? MCV 79.8 (L) 10/04/2021  ? PLT 248 10/04/2021  ? ?Lab Results  ?Component Value Date  ? FERRITIN 20 09/06/2021  ? IRON 50 08/09/2021  ? TIBC 343 08/09/2021  ? UIBC 293 08/09/2021  ? IRONPCTSAT 15 08/09/2021  ? ?Lab Results  ?Component Value Date  ? RETICCTPCT 1.6 10/04/2021  ? RBC 4.37 10/04/2021  ? ?No results found for: KPAFRELGTCHN,  LAMBDASER, KAPLAMBRATIO ?No results found for: IGGSERUM, IGA, IGMSERUM ?No results found for: TOTALPROTELP, ALBUMINELP, A1GS, A2GS, BETS, BETA2SER, GAMS, MSPIKE, SPEI ?  Chemistry   ?   ?Component Value Date/Time  ? NA 141 09/20/2021 1300  ? K 3.4 (L) 09/20/2021 1300  ? CL 106 09/20/2021 1300  ? CO2 26 09/20/2021 1300  ? BUN 6 09/20/2021 1300  ? CREATININE 0.46 09/20/2021 1300  ? CREATININE 0.56 03/23/2021 0000  ?    ?  Component Value Date/Time  ? CALCIUM 8.9 09/20/2021 1300  ? ALKPHOS 68 09/20/2021 1300  ? AST 10 (L) 09/20/2021 1300  ? ALT 9 09/20/2021 1300  ? BILITOT 0.2 (L) 09/20/2021 1300  ?  ? ? ? ?Impression and Plan: Ms. Overdorf is a very pleasant 56 yo caucasian female with metastatic colon cancer.  She has a liver met that is quite large.  She has some adenopathy. ? ?We will continue her on the targeted therapy.  So far, she has responded quite nicely. ? ?I will plan for another PET scan on her at the end of the month..  She gets the Vectibix every 2 weeks.  Again I think her skin is doing pretty well with this. ? ?Hopefully, her left shoulder will continue to improve. ? ?I would not do another PET scan on her probably until June.  At that point, then we may have to think about some type of targeted liver approach for her disease. ? ?I really hope that her left shoulder gets better.  I just hate the fact that this is causing so much problem for her. ? ?  ? ?[name], MD ?5/10/20231:39 PM ? ?

## 2021-10-05 ENCOUNTER — Encounter: Payer: Self-pay | Admitting: *Deleted

## 2021-10-05 ENCOUNTER — Telehealth: Payer: Self-pay

## 2021-10-05 LAB — IRON AND IRON BINDING CAPACITY (CC-WL,HP ONLY)
Iron: 24 ug/dL — ABNORMAL LOW (ref 28–170)
Saturation Ratios: 6 % — ABNORMAL LOW (ref 10.4–31.8)
TIBC: 412 ug/dL (ref 250–450)
UIBC: 388 ug/dL (ref 148–442)

## 2021-10-05 NOTE — Telephone Encounter (Signed)
-----   Message from [name], MD sent at 10/05/2021 11:15 AM EDT ----- ?Please call her and let her know that the iron is very low.  We need to get her in for some IV iron.  Thanks.  Pete ?

## 2021-10-05 NOTE — Progress Notes (Signed)
Patient needs PET prior to her next appointment. Scheduled for 10/30/21. ? ?Patient is aware of appointment including date, location and time. Educated her regarding PET prep. Mailed radiology information sheet to patient home with same information for education reinforcement.  ? ?Oncology Nurse Navigator Documentation ? ? ?  10/05/2021  ? 10:00 AM  ?Oncology Nurse Navigator Flowsheets  ?Navigator Follow Up Date: 10/30/2021  ?Navigator Follow Up Reason: Scan Review  ?Navigator Location [facility]  ?Navigator Encounter Type Telephone;Appt/Treatment Plan Review  ?Telephone Appt Confirmation/Clarification;Education;Outgoing Call  ?Patient Visit Type MedOnc  ?Treatment Phase Active Tx  ?Barriers/Navigation Needs Coordination of Care;Education  ?Interventions Coordination of Care;Education  ?Acuity Level 2-Minimal Needs (1-2 Barriers Identified)  ?Coordination of Care Radiology  ?Education Method Verbal;Written  ?Support Groups/Services Friends and Family  ?Time Spent with Patient 30  ?  ?

## 2021-10-05 NOTE — Telephone Encounter (Signed)
Called and informed pt of iron results via voicemail and that scheduling will reach out to schedule appts. ?

## 2021-10-06 ENCOUNTER — Telehealth: Payer: Self-pay | Admitting: Hematology & Oncology

## 2021-10-06 NOTE — Telephone Encounter (Signed)
Called to schedule per 5/10 los and to schedule 2 doses of iron per 5/11 sch msg , patient would like to call us back to schedule  ?

## 2021-10-10 ENCOUNTER — Inpatient Hospital Stay: Payer: 59

## 2021-10-10 VITALS — BP 120/61 | HR 91 | Temp 98.4°F | Resp 18

## 2021-10-10 DIAGNOSIS — D509 Iron deficiency anemia, unspecified: Secondary | ICD-10-CM

## 2021-10-10 DIAGNOSIS — D649 Anemia, unspecified: Secondary | ICD-10-CM

## 2021-10-10 DIAGNOSIS — Z5112 Encounter for antineoplastic immunotherapy: Secondary | ICD-10-CM | POA: Diagnosis not present

## 2021-10-10 LAB — ESTRADIOL, ULTRA SENS: Estradiol, Sensitive: 5.5 pg/mL

## 2021-10-10 MED ORDER — SODIUM CHLORIDE 0.9 % IV SOLN: Freq: Once | INTRAVENOUS | Status: AC

## 2021-10-10 MED ORDER — SODIUM CHLORIDE 0.9% FLUSH: 10.0000 mL | Freq: Once | INTRAVENOUS | Status: DC | PRN

## 2021-10-10 MED ORDER — SODIUM CHLORIDE 0.9% FLUSH: 3.0000 mL | Freq: Once | INTRAVENOUS | Status: DC | PRN

## 2021-10-10 MED ORDER — SODIUM CHLORIDE 0.9 % IV SOLN
125.0000 mg | Freq: Once | INTRAVENOUS | Status: AC
  Administered 2021-10-10: 125 mg via INTRAVENOUS
  Filled 2021-10-10: qty 125

## 2021-10-10 NOTE — Patient Instructions (Signed)
Iron Deficiency Anemia, Adult Iron deficiency anemia is when you do not have enough red blood cells or hemoglobin in your blood. This happens because you have too little iron in your body. Hemoglobin carries oxygen to parts of the body. Anemia can cause yourbody to not get enough oxygen. What are the causes? Not eating enough foods that have iron in them. The body not being able to take in iron well. Needing more iron due to pregnancy or heavy menstrual periods, for females. Cancer. Bleeding in the bowels. Many blood draws. What increases the risk? Being pregnant. Being a teenage girl going through a growth spurt. What are the signs or symptoms? Pale skin, lips, and nails. Weakness, dizziness, and getting tired easily. Headache. Feeling like you cannot breathe well when moving (shortness of breath). Cold hands and feet. Fast heartbeat or a heartbeat that is not regular. Feeling grouchy (irritable) or breathing fast. These are more common in very bad anemia. Mild anemia may not cause any symptoms. How is this treated? This condition is treated by finding out why you do not have enough iron and then getting more iron. It may include: Adding foods to your diet that have a lot of iron. Taking iron pills (supplements). If you are pregnant or breastfeeding, you may need to take extra iron. Your diet often does not provide the amount of iron that you need. Getting more vitamin C in your diet. Vitamin C helps your body take in iron. You may need to take iron pills with a glass of orange juice or vitamin C pills. Medicines to make heavy menstrual periods lighter. Surgery. You may need blood tests to see if treatment is working. If the treatment doesnot seem to be working, you may need more tests. Follow these instructions at home: Medicines Take over-the-counter and prescription medicines only as told by your doctor. This includes iron pills and vitamins. Take iron pills when your stomach is  empty. If you cannot handle this, take them with food. Do not drink milk or take antacids at the same time as your iron pills. Iron pills may turn your poop (stool)black. If you cannot handle taking iron pills by mouth, ask your doctor about getting iron through: An IV tube. A shot (injection) into a muscle. Eating and drinking  Talk with your doctor before changing the foods you eat. He or she may tell you to eat foods that have a lot of iron, such as: Liver. Low-fat (lean) beef. Breads and cereals that have iron added to them. Eggs. Dried fruit. Dark green, leafy vegetables. Eat fresh fruits and vegetables that are high in vitamin C. They help your body use iron. Foods with a lot of vitamin C include: Oranges. Peppers. Tomatoes. Mangoes. Drink enough fluid to keep your pee (urine) pale yellow.  Managing constipation If you are taking iron pills, they may cause trouble pooping (constipation). To prevent or treat trouble pooping, you may need to: Take over-the-counter or prescription medicines. Eat foods that are high in fiber. These include beans, whole grains, and fresh fruits and vegetables. Limit foods that are high in fat and sugar. These include fried or sweet foods. General instructions Return to your normal activities as told by your doctor. Ask your doctor what activities are safe for you. Keep yourself clean, and keep things clean around you. Keep all follow-up visits as told by your doctor. This is important. Contact a doctor if: You feel like you may vomit (nauseous), or you vomit. You feel   weak. You are sweating for no reason. You have trouble pooping, such as: Pooping less than 3 times a week. Straining to poop. Having poop that is hard, dry, or larger than normal. Feeling full or bloated. Pain in the lower belly. Not feeling better after pooping. Get help right away if: You pass out (faint). You have chest pain. You have trouble breathing that: Is very  bad. Gets worse with physical activity. You have a fast heartbeat, or a heartbeat that does not feel regular. You get light-headed when getting up from sitting or lying down. These symptoms may be an emergency. Do not wait to see if the symptoms will go away. Get medical help right away. Call your local emergency services (911 in the [place] Do not drive yourself to the hospital. Summary Iron deficiency anemia is when you have too little iron in your body. This condition is treated by finding out why you do not have enough iron in your body and then getting more iron. Take over-the-counter and prescription medicines only as told by your doctor. Eat fresh fruits and vegetables that are high in vitamin C. Get help right away if you cannot breathe well. This information is not intended to replace advice given to you by your health care provider. Make sure you discuss any questions you have with your healthcare provider. Document Revised: 01/20/2019 Document Reviewed: 01/20/2019 Elsevier Patient Education  2022 [employer] Inc.  

## 2021-10-11 ENCOUNTER — Telehealth: Payer: Self-pay

## 2021-10-11 NOTE — Telephone Encounter (Signed)
Called and informed patient of lab results, patient verbalized understanding and denies any questions or concerns at this time.   

## 2021-10-11 NOTE — Telephone Encounter (Signed)
-----   Message from [name], MD sent at 10/10/2021  5:41 PM EDT ----- ?Call -- you are definitely post-menopausal by your estradiol level! Pete ?

## 2021-10-16 ENCOUNTER — Other Ambulatory Visit (HOSPITAL_BASED_OUTPATIENT_CLINIC_OR_DEPARTMENT_OTHER): Payer: Self-pay

## 2021-10-16 ENCOUNTER — Encounter: Payer: Self-pay | Admitting: Physician Assistant

## 2021-10-16 ENCOUNTER — Ambulatory Visit (INDEPENDENT_AMBULATORY_CARE_PROVIDER_SITE_OTHER): Payer: 59 | Admitting: Physician Assistant

## 2021-10-16 ENCOUNTER — Telehealth: Payer: Self-pay

## 2021-10-16 VITALS — BP 160/76 | HR 86 | Ht 61.0 in | Wt 182.0 lb

## 2021-10-16 DIAGNOSIS — J0140 Acute pansinusitis, unspecified: Secondary | ICD-10-CM

## 2021-10-16 DIAGNOSIS — L980 Pyogenic granuloma: Secondary | ICD-10-CM

## 2021-10-16 MED ORDER — AMOXICILLIN-POT CLAVULANATE 875-125 MG PO TABS
1.0000 | ORAL_TABLET | Freq: Two times a day (BID) | ORAL | 0 refills | Status: DC
  Filled 2021-10-16: qty 10, 5d supply, fill #0

## 2021-10-16 NOTE — Patient Instructions (Addendum)
Augmentin and flonase for sinus infection Keep pressure bandage on for 12 hours follow  up as needed  Pyogenic Granuloma Pyogenic granuloma is a growth (lesion) that forms on the skin or on the mucous membranes of the mouth. This type of lesion is a lump of very red tissue that bleeds easily, is usually a single lesion, and most often affects: The head and neck. The mucous membranes of the mouth or tongue. The upper body. The hands and feet. A pyogenic granuloma usually measures about 0.2 inches (0.5 cm), but lesions can be smaller or larger. This condition does not spread from person to person (is not contagious). The lesion is noncancerous (benign). What are the causes? The cause of pyogenic granulomas is unknown, but the lesions commonly occur after a minor injury, such as pricking your skin or biting your lip or tongue. A mound of tiny blood vessels (capillaries) forms to create a lesion. Sometimes the lesions occur without an injury. What increases the risk? You are more likely to develop this condition if: You are pregnant. You are a child or young adult. You take certain medicines, including: Medicines for acne. Birth control pills. Some medicines used to treat cancer, HIV, or AIDS. What are the signs or symptoms? The main symptom of this condition is a raised or lumpy lesion that is very red. Your lesion may also: Have a crusty, broken, and irritated (ulcerated) surface. Bleed easily. Be slightly sore. How is this diagnosed? This condition is diagnosed based on your symptoms and medical history, especially if you recently had an injury. You may also have: A physical exam. A small piece of your granuloma removed for testing (biopsy) to rule out cancer. How is this treated? A small lesion may go away without treatment. You may have to stop or change any medicines that caused your lesion. Pyogenic granulomas caused by pregnancy usually go away after delivery. This condition may  also be treated by removing the lesion. This may be done if the lesion is large, irritated, or bleeds easily. Removal may involve: Curettage. This scrapes away the lesion. Using chemicals or electric energy to destroy the lesion. Surgical excision. This removes the lesion along with a small piece of normal skin or mucous membrane. This is the best treatment to prevent the lesion from coming back. Follow these instructions at home: Take over-the-counter and prescription medicines only as told by your health care provider. Do not scratch or pick at your lesion. Cover your lesion area with a bandage (dressing) or gauze to avoid having anything rub against your lesion. Keep your lesion clean to avoid infection. Keep all follow-up visits. This is important. Contact a health care provider if: You have a fever. Your lesion bleeds. Your lesion comes back after treatment. You have a lesion that grows rapidly or hardens. Summary Pyogenic granuloma is a growth (lesion) that forms on the skin or on the mucous membranes of the mouth. This condition does not spread from person to person (is not contagious). A small lesion may go away without treatment. If your lesion is large, irritated, or bleeds easily, you may need to have it removed. Do not scratch or pick at your lesion. Cover your lesion area with a bandage (dressing) or gauze to avoid having anything rub against your lesion. Keep your lesion clean to avoid infection. This information is not intended to replace advice given to you by your health care provider. Make sure you discuss any questions you have with your health care  provider. Document Revised: 09/08/2020 Document Reviewed: 09/08/2020 Elsevier Patient Education  [place].

## 2021-10-16 NOTE — Telephone Encounter (Signed)
Pt called stating she has a cold and wasnt to know if she needs to reschedule wednesdays appt and if Dr.Ennever wants to prescribe her anything. She also mentioned she has a bump behind her ear her bumped this morning and it hasnt stopped bleeding (been an hour).  Pt advised per MD to go to nearest urgent care to seek care for the bleeding and to get checked for the cold symptoms. Pt to call back once she goes to see about her appt Wednesday.

## 2021-10-16 NOTE — Progress Notes (Signed)
Acute Office Visit  Subjective:     Patient ID: Rebecca Bullock, female    DOB: 12/07/1965, 56 y.o.   MRN: [id number]  Chief Complaint  Patient presents with   Follow-up    HPI Patient is in today for bleeding lesion behind left ear that started today after papule behind ear got pulled and started bleeding. Just noted red papule about month or so ago. It has not hurt or itched. She pulled on it today and will not stop bleeding. No fever, chills or nausea. She does have lots of sinus pressure, congestion, cough. She is blowing out yellow discharge. Symptoms for 5 days. No SOB. She is taking mucinex-D, tylenol with minimal relief. She is undergoing colon cancer treatments currently.   .. 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   SOB (shortness of breath) 03/13/2021   Symptomatic anemia 03/14/2021   Chronic cough 03/23/2021   Severe anemia 03/23/2021   SOB (shortness of breath) on exertion 03/23/2021   IDA (iron deficiency anemia) 03/24/2021   Hiatal hernia 04/06/2021   Post-menopausal bleeding 04/10/2021   Bilateral lower abdominal cramping 04/11/2021   History of uterine fibroid 04/11/2021   Colon cancer metastasized to liver ([place]) 05/05/2021   Goals of care, counseling/discussion 05/05/2021   Family history of pancreatic cancer 06/05/2021   Family history of lung cancer 06/05/2021   Colon cancer ([place]) 05/03/2021   Menorrhagia with regular cycle 78/58/8502   Lichenoid dermatitis 10/16/2019   Fibroids 11/26/2018   Endometrial thickening on ultrasound 05/03/2021   Endometrial mass 05/03/2021   Abnormal uterine bleeding 11/26/2018   Resolved Ambulatory Problems    Diagnosis Date Noted   No Resolved Ambulatory Problems   No Additional Past Medical History      ROS  See HPI.     Objective:    BP (!) 160/76   Pulse 86   Ht '5\' 1"'$  (1.549 m)   Wt 182 lb (82.6 kg)   SpO2 99%   BMI 34.39 kg/m  BP Readings from Last 3 Encounters:  10/16/21 (!) 160/76  10/10/21 120/61  10/04/21 (!) 141/73   Wt Readings from Last 3 Encounters:  10/16/21 182 lb (82.6 kg)  10/04/21 181 lb 1.3 oz (82.1 kg)  09/06/21 182 lb (82.6 kg)      Physical Exam Vitals reviewed.  Constitutional:      Appearance: She is obese.  HENT:     Head: Normocephalic.     Right Ear: Tympanic membrane, ear canal and external ear normal. There is no impacted cerumen.     Left Ear: Tympanic membrane, ear canal and external ear normal. There is no impacted cerumen.     Ears:     Comments: Sinus pressure.     Nose: Congestion present.     Mouth/Throat:     Mouth: Mucous membranes are moist.     Pharynx: Posterior oropharyngeal erythema present. No oropharyngeal exudate.  Eyes:     Extraocular Movements: Extraocular movements intact.     Conjunctiva/sclera: Conjunctivae normal.     Pupils: Pupils are equal, round, and reactive to light.  Neck:     Vascular: No carotid bruit.  Cardiovascular:     Rate and Rhythm: Regular rhythm. Bradycardia present.  Pulmonary:     Effort: Pulmonary effort is normal.  Breath sounds: Normal breath sounds.  Musculoskeletal:     Cervical back: Normal range of motion and neck supple. No rigidity or tenderness.     Right lower leg: No edema.     Left lower leg: No edema.  Lymphadenopathy:     Cervical: No cervical adenopathy.  Skin:    Comments: Behind left ear 34m erythematous papule that is actively bleeding.     Pre-operative Diagnosis: pyrogenic granuloma  Post-operative Diagnosi pyrogenic granuloma  Locations: behind left ear  Indications: pain and bleeding  Procedure Details  The risks (including bleeding and infection) and benefits of the procedure and Verbal informed consent obtained. Using sterile iris scissors,  on area of skin tag vs pyrogenic granuloma were snipped off at their bases after cleansing with Betadine.  Bleeding was controlled by pressure and sliver nitrite stick.   Findings: Pathognomonic benign lesions  not sent for pathological exam.  Condition: Stable  Complications: none.  Plan: 1. Instructed to keep the wounds dry and covered for 24-48h and clean thereafter. 2. Warning signs of infection were reviewed.   3. Recommended that the patient use OTC acetaminophen as needed for pain.       Assessment & Plan:  .Rebecca Bullock KitchenMarland Kitchenammy was seen today for follow-up.  Diagnoses and all orders for this visit:  Pyogenic granuloma  Acute non-recurrent pansinusitis -     amoxicillin-clavulanate (AUGMENTIN) 875-125 MG tablet; Take 1 tablet by mouth 2 (two) times daily.   Granuloma was cut the rest of way off and covered with compression bandage Discussed red flag signs of depression Follow up as needed Augmentin given for sinusitis Follow up as needed.

## 2021-10-17 ENCOUNTER — Other Ambulatory Visit: Payer: Self-pay

## 2021-10-17 DIAGNOSIS — C189 Malignant neoplasm of colon, unspecified: Secondary | ICD-10-CM

## 2021-10-18 ENCOUNTER — Other Ambulatory Visit (HOSPITAL_BASED_OUTPATIENT_CLINIC_OR_DEPARTMENT_OTHER): Payer: Self-pay

## 2021-10-18 ENCOUNTER — Inpatient Hospital Stay: Payer: 59

## 2021-10-18 ENCOUNTER — Telehealth: Payer: Self-pay | Admitting: Neurology

## 2021-10-18 VITALS — BP 128/61 | HR 78 | Temp 98.0°F | Resp 17

## 2021-10-18 DIAGNOSIS — D509 Iron deficiency anemia, unspecified: Secondary | ICD-10-CM

## 2021-10-18 DIAGNOSIS — C189 Malignant neoplasm of colon, unspecified: Secondary | ICD-10-CM

## 2021-10-18 DIAGNOSIS — Z5112 Encounter for antineoplastic immunotherapy: Secondary | ICD-10-CM | POA: Diagnosis not present

## 2021-10-18 DIAGNOSIS — D649 Anemia, unspecified: Secondary | ICD-10-CM

## 2021-10-18 LAB — CBC WITH DIFFERENTIAL (CANCER CENTER ONLY)
Abs Immature Granulocytes: 0.03 10*3/uL (ref 0.00–0.07)
Basophils Absolute: 0 10*3/uL (ref 0.0–0.1)
Basophils Relative: 0 %
Eosinophils Absolute: 0.2 10*3/uL (ref 0.0–0.5)
Eosinophils Relative: 3 %
HCT: 36 % (ref 36.0–46.0)
Hemoglobin: 11.2 g/dL — ABNORMAL LOW (ref 12.0–15.0)
Immature Granulocytes: 1 %
Lymphocytes Relative: 25 %
Lymphs Abs: 1.2 10*3/uL (ref 0.7–4.0)
MCH: 24.8 pg — ABNORMAL LOW (ref 26.0–34.0)
MCHC: 31.1 g/dL (ref 30.0–36.0)
MCV: 79.8 fL — ABNORMAL LOW (ref 80.0–100.0)
Monocytes Absolute: 0.4 10*3/uL (ref 0.1–1.0)
Monocytes Relative: 10 %
Neutro Abs: 2.7 10*3/uL (ref 1.7–7.7)
Neutrophils Relative %: 61 %
Platelet Count: 216 10*3/uL (ref 150–400)
RBC: 4.51 MIL/uL (ref 3.87–5.11)
RDW: 15.9 % — ABNORMAL HIGH (ref 11.5–15.5)
WBC Count: 4.5 10*3/uL (ref 4.0–10.5)
nRBC: 0 % (ref 0.0–0.2)

## 2021-10-18 LAB — CMP (CANCER CENTER ONLY)
ALT: 8 U/L (ref 0–44)
AST: 10 U/L — ABNORMAL LOW (ref 15–41)
Albumin: 4.1 g/dL (ref 3.5–5.0)
Alkaline Phosphatase: 86 U/L (ref 38–126)
Anion gap: 8 (ref 5–15)
BUN: 10 mg/dL (ref 6–20)
CO2: 27 mmol/L (ref 22–32)
Calcium: 9 mg/dL (ref 8.9–10.3)
Chloride: 106 mmol/L (ref 98–111)
Creatinine: 0.43 mg/dL — ABNORMAL LOW (ref 0.44–1.00)
GFR, Estimated: >60 mL/min (ref >60)
Glucose, Bld: 123 mg/dL — ABNORMAL HIGH (ref 70–99)
Potassium: 3.4 mmol/L — ABNORMAL LOW (ref 3.5–5.1)
Sodium: 141 mmol/L (ref 135–145)
Total Bilirubin: 0.2 mg/dL — ABNORMAL LOW (ref 0.3–1.2)
Total Protein: 6.7 g/dL (ref 6.5–8.1)

## 2021-10-18 MED ORDER — SODIUM CHLORIDE 0.9 % IV SOLN
125.0000 mg | Freq: Once | INTRAVENOUS | Status: AC
  Administered 2021-10-18: 125 mg via INTRAVENOUS
  Filled 2021-10-18: qty 125

## 2021-10-18 MED ORDER — HEPARIN SOD (PORK) LOCK FLUSH 100 UNIT/ML IV SOLN
500.0000 [IU] | Freq: Once | INTRAVENOUS | Status: AC | PRN
  Administered 2021-10-18: 500 [IU]

## 2021-10-18 MED ORDER — SODIUM CHLORIDE 0.9% FLUSH
10.0000 mL | INTRAVENOUS | Status: DC | PRN
  Administered 2021-10-18: 10 mL

## 2021-10-18 MED ORDER — SODIUM CHLORIDE 0.9 % IV SOLN
6.0000 mg/kg | Freq: Once | INTRAVENOUS | Status: AC
  Administered 2021-10-18: 500 mg via INTRAVENOUS
  Filled 2021-10-18: qty 20

## 2021-10-18 MED ORDER — SODIUM CHLORIDE 0.9 % IV SOLN: Freq: Once | INTRAVENOUS | Status: AC

## 2021-10-18 MED ORDER — AZITHROMYCIN 250 MG PO TABS
ORAL_TABLET | ORAL | 0 refills | Status: AC
  Filled 2021-10-18: qty 6, 5d supply, fill #0

## 2021-10-18 NOTE — Telephone Encounter (Signed)
Patient made aware new RX sent.

## 2021-10-18 NOTE — Patient Instructions (Signed)
[facility]  Discharge Instructions: Thank you for choosing [facility] to provide your oncology and hematology care.   If you have a lab appointment with the [facility], please go directly to the [facility] and check in at the registration area.  Wear comfortable clothing and clothing appropriate for easy access to any Portacath or PICC line.   We strive to give you quality time with your provider. You may need to reschedule your appointment if you arrive late (15 or more minutes).  Arriving late affects you and other patients whose appointments are after yours.  Also, if you miss three or more appointments without notifying the office, you may be dismissed from the clinic at the provider's discretion.      For prescription refill requests, have your pharmacy contact our office and allow 72 hours for refills to be completed.    Today you received the following chemotherapy and/or immunotherapy agents Vectibix and Ferrlecit    To help prevent nausea and vomiting after your treatment, we encourage you to take your nausea medication as directed.  BELOW ARE SYMPTOMS THAT SHOULD BE REPORTED IMMEDIATELY: *FEVER GREATER THAN 100.4 F (38 C) OR HIGHER *CHILLS OR SWEATING *NAUSEA AND VOMITING THAT IS NOT CONTROLLED WITH YOUR NAUSEA MEDICATION *UNUSUAL SHORTNESS OF BREATH *UNUSUAL BRUISING OR BLEEDING *URINARY PROBLEMS (pain or burning when urinating, or frequent urination) *BOWEL PROBLEMS (unusual diarrhea, constipation, pain near the anus) TENDERNESS IN MOUTH AND THROAT WITH OR WITHOUT PRESENCE OF ULCERS (sore throat, sores in mouth, or a toothache) UNUSUAL RASH, SWELLING OR PAIN  UNUSUAL VAGINAL DISCHARGE OR ITCHING   Items with * indicate a potential emergency and should be followed up as soon as possible or go to the Emergency Department if any problems should occur.  Please show the CHEMOTHERAPY ALERT CARD or IMMUNOTHERAPY ALERT CARD at  check-in to the Emergency Department and triage nurse. Should you have questions after your visit or need to cancel or reschedule your appointment, please contact [facility]  [phone] and follow the prompts.  Office hours are 8:00 a.m. to 4:30 p.m. Monday - Friday. Please note that voicemails left after 4:00 p.m. may not be returned until the following business day.  We are closed weekends and major holidays. You have access to a nurse at all times for urgent questions. Please call the main number to the clinic [phone] and follow the prompts.  For any non-urgent questions, you may also contact your provider using MyChart. We now offer e-Visits for anyone 4 and older to request care online for non-urgent symptoms. For details visit [email or web address].   Also download the MyChart app! Go to the app store, search "MyChart", open the app, select [facility], and log in with your MyChart username and password.  Due to Covid, a mask is required upon entering the hospital/clinic. If you do not have a mask, one will be given to you upon arrival. For doctor visits, patients may have 1 support person aged 70 or older with them. For treatment visits, patients cannot have anyone with them due to current Covid guidelines and our immunocompromised population.

## 2021-10-18 NOTE — Telephone Encounter (Signed)
Ok will send to [facility].

## 2021-10-18 NOTE — Telephone Encounter (Signed)
Patient left vm stating the Augmentin she was given for sinus infection on 10/16/2021 is causing severe diarrhea. She is asking for an alternative. Please advise.

## 2021-10-18 NOTE — Patient Instructions (Signed)

## 2021-10-20 ENCOUNTER — Inpatient Hospital Stay: Payer: 59

## 2021-10-27 ENCOUNTER — Other Ambulatory Visit (HOSPITAL_BASED_OUTPATIENT_CLINIC_OR_DEPARTMENT_OTHER): Payer: Self-pay

## 2021-10-27 MED ORDER — PREDNISONE 5 MG PO TABS
ORAL_TABLET | ORAL | 0 refills | Status: DC
Start: 2021-10-27 — End: 2022-01-10
  Filled 2021-10-27: qty 48, 12d supply, fill #0

## 2021-10-30 ENCOUNTER — Encounter (HOSPITAL_COMMUNITY)
Admission: RE | Admit: 2021-10-30 | Discharge: 2021-10-30 | Disposition: A | Discharge status: Home / Self Care | Payer: 59 | Source: Ambulatory Visit | Attending: Hematology & Oncology | Admitting: Hematology & Oncology

## 2021-10-30 ENCOUNTER — Encounter: Payer: Self-pay | Admitting: *Deleted

## 2021-10-30 DIAGNOSIS — C787 Secondary malignant neoplasm of liver and intrahepatic bile duct: Secondary | ICD-10-CM | POA: Insufficient documentation

## 2021-10-30 DIAGNOSIS — C189 Malignant neoplasm of colon, unspecified: Secondary | ICD-10-CM | POA: Diagnosis present

## 2021-10-30 LAB — GLUCOSE, CAPILLARY: Glucose-Capillary: 88 mg/dL (ref 70–99)

## 2021-10-30 IMAGING — PT NM PET TUM IMG RESTAG (PS) SKULL BASE T - THIGH
8 series · 25 of 25 positions shown · non-contrast
Comparison: [DATE] PET-CT

CLINICAL DATA: Subsequent treatment strategy for colon cancer liver
metastases.

EXAM:
NUCLEAR MEDICINE PET SKULL BASE TO THIGH
TECHNIQUE: 9.0 mCi F-18 FDG was injected intravenously. Full-ring PET imaging
was performed from the skull base to thigh after the radiotracer. CT
data was obtained and used for attenuation correction and anatomic
localization.
Fasting blood glucose: 88 mg/dl

[Series 3: pet sk_thigh ac · axial · 5.0mm · 4.07mm/px · z∈[-936,-40]mm · 5 of 225 slices shown]
[im 1/225]
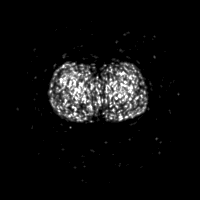
[im 57/225]
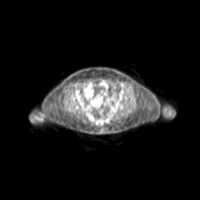
[im 113/225]
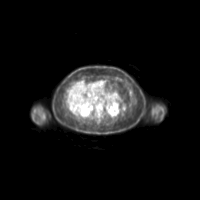
[im 169/225]
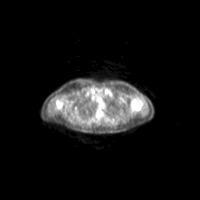
[im 225/225]
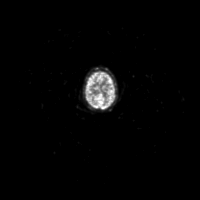

[Series 4: ct sk_thigh 5.0 br38 · axial · 5.0mm · 0.98mm/px · z∈[-936,-40]mm · 5 of 225 slices shown]
[im 1/225]
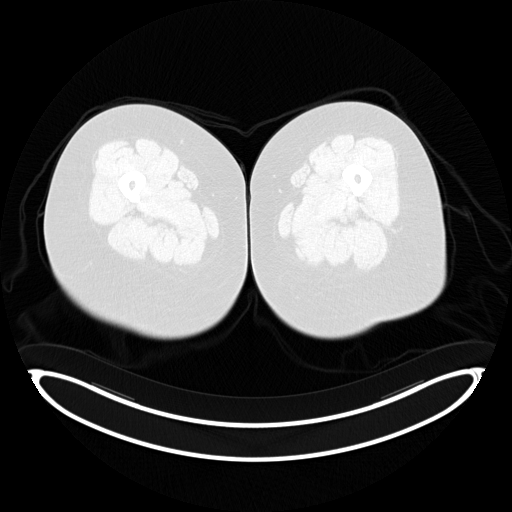
[im 57/225]
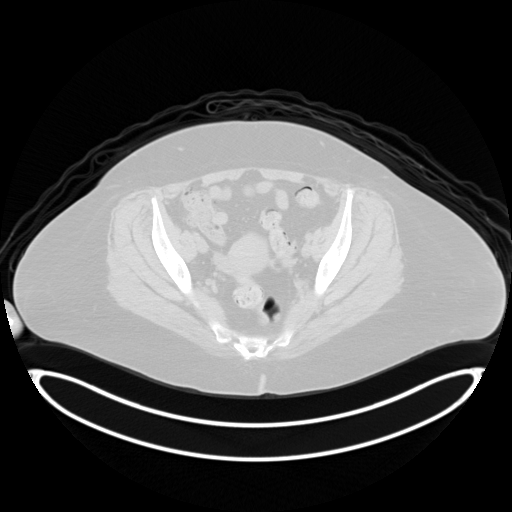
[im 113/225]
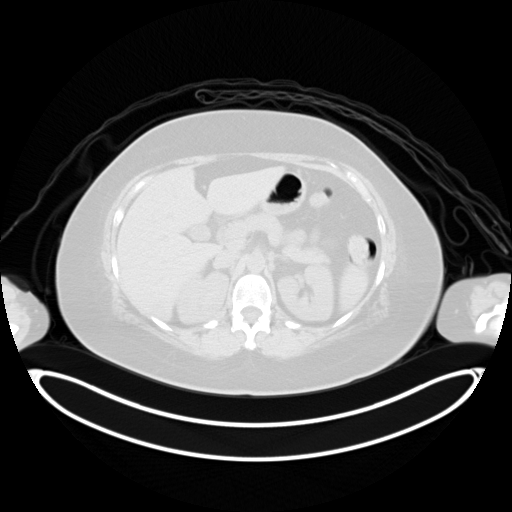
[im 169/225]
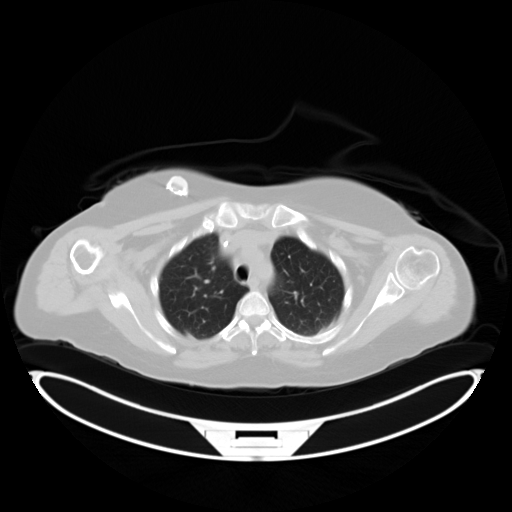
[im 225/225  brain]
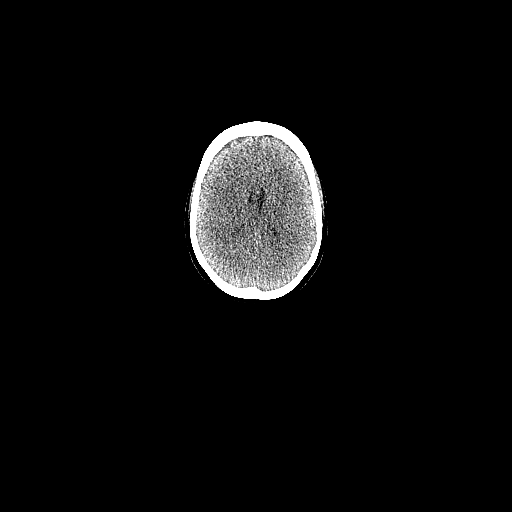

[Series 5: pet sk_thigh nac · axial · 5.0mm · 4.07mm/px · z∈[-936,-40]mm · 5 of 225 slices shown]
[im 1/225]
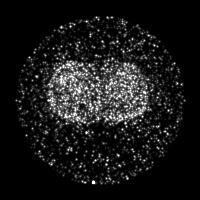
[im 57/225]
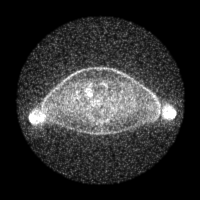
[im 113/225]
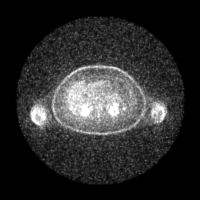
[im 169/225]
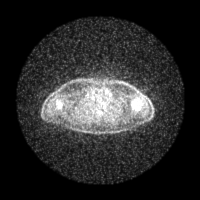
[im 225/225]
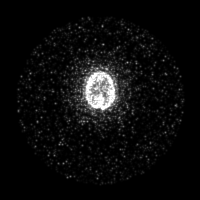

[Series 7: ct 5.0 bl57 lung_bone · axial · 5.0mm · 0.57mm/px · z∈[-457,-209]mm · 2 of 63 slices shown]
[im 1/63]
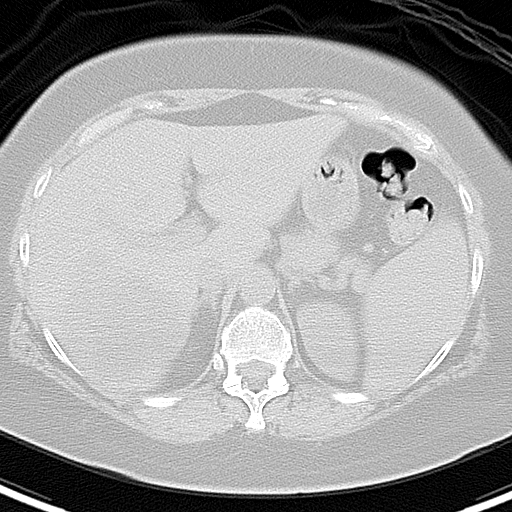
[im 63/63]
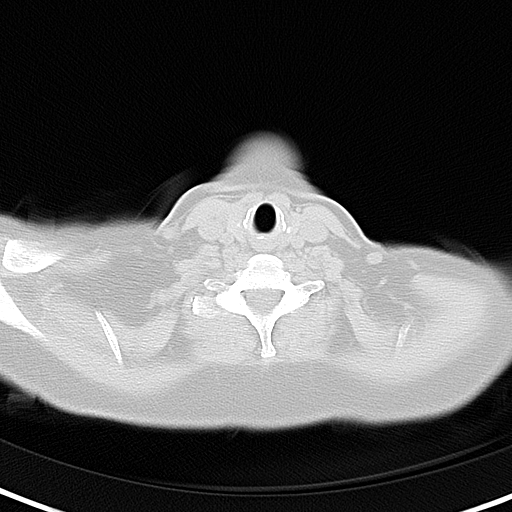

[Series 604: fused tra · 5 of 222 slices shown]
[im 1/222]
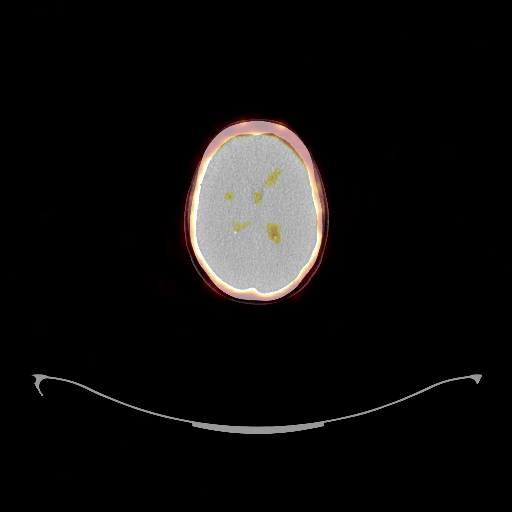
[im 56/222]
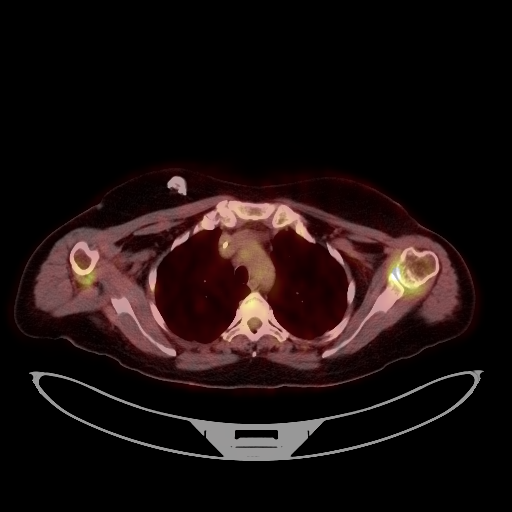
[im 111/222]
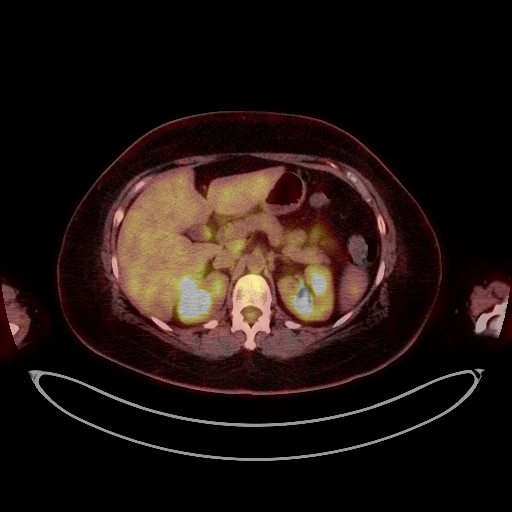
[im 166/222]
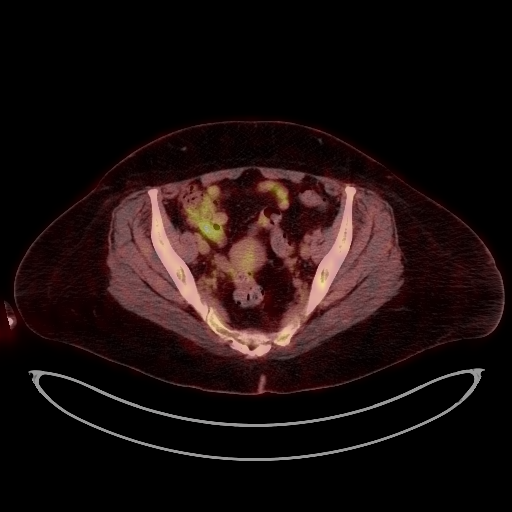
[im 222/222]
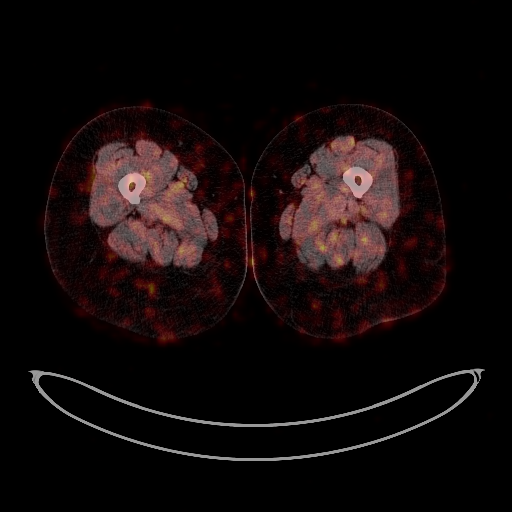

[Series 605: fused cor · 1 of 39 slices shown]
[im 1/39]
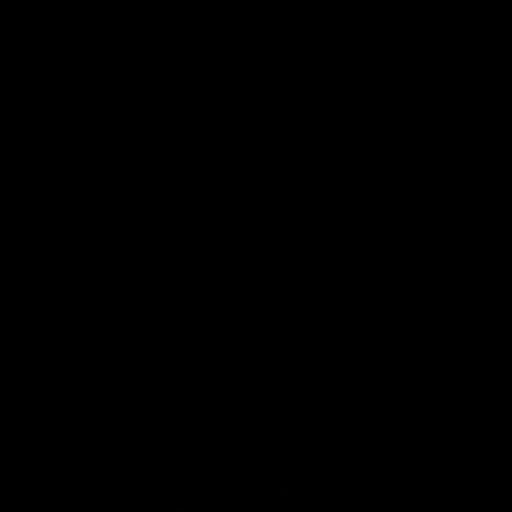

[Series 606: mip pet · coronal · 1.86mm/px · 1 of 32 slices shown]
[im 1/32]
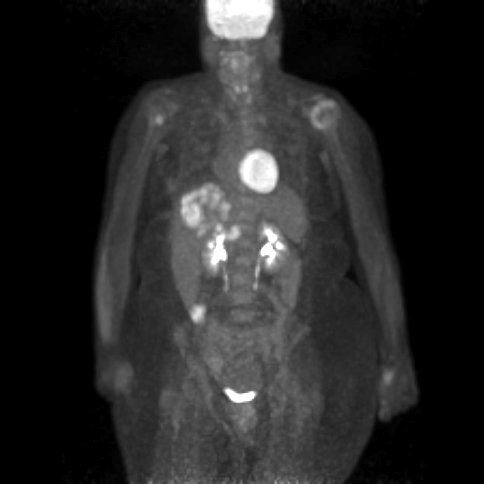

[Series 1455: results mm oncology reading · 4.0mm · 1.24mm/px · 1 of 4 slices shown]
[im 1/4]
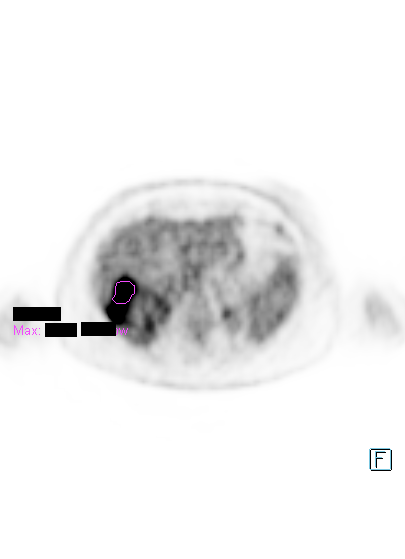

[25 of 25 positions shown; findings below may reference images not displayed]

FINDINGS: Mediastinal blood pool activity: SUV max

Liver activity: SUV max NA

NECK: No hypermetabolic lymph nodes in the neck.

Incidental CT findings: none

CHEST: No hypermetabolic mediastinal or hilar nodes. No suspicious
pulmonary nodules on the CT scan.

Incidental CT findings: Right Port-A-Cath tip is positioned in the
right atrium no pericardial or pleural effusion

ABDOMEN/PELVIS: Dominant right hepatic lobe lesion measures 8.4 x
7.4 cm today compared to 8.2 x 7.8 cm previously, not substantially
changed. The peripheral hypermetabolism has increased with SUV max =
12.4 on today's exam increased from 8.4 previously.

18 mm subtle ill-defined low-density lesion posterior right liver on
113/4 was 8 mm previously (remeasured). No discernible
hypermetabolism on today's exam.

15 mm portal caval lymph node on 110/4 is markedly progressive in
the interval with SUV max = 12.1 today compared to 2.8 previously.

Hypermetabolism associated with the patient's known right colon mass
has increased demonstrating SUV max = 13.3 today compared to
previously.

Incidental CT findings: Calcified gallstones evident.

SKELETON: Uptake in the left shoulder region is compatible with the
reported history of recent surgery. There is focal FDG accumulation
posterior to the left humeral neck, nonspecific.

Incidental CT findings: none
IMPRESSION: 1. Interval increase in hypermetabolism associated with the
patient's known right: Lesion.
2. Peripheral hypermetabolism associated with the dominant posterior
right hepatic lobe mass has increased in the interval.
3. Interval increase in size and hypermetabolism of a portal caval
lymph node in the hepatoduodenal ligament.
4. No evidence for hypermetabolic metastatic disease in the chest or
pelvis.
5. Cholelithiasis.

## 2021-10-30 MED ORDER — FLUDEOXYGLUCOSE F - 18 (FDG) INJECTION
9.0400 | Freq: Once | INTRAVENOUS | Status: AC
  Administered 2021-10-30: 9.04 via INTRAVENOUS

## 2021-10-30 NOTE — Progress Notes (Signed)
Oncology Nurse Navigator Documentation     10/30/2021    2:00 PM  Oncology Nurse Navigator Flowsheets  Navigator Follow Up Date: 10/31/2021  Navigator Follow Up Reason: Follow-up Appointment;Chemotherapy  Navigator Location [facility]  Navigator Encounter Type Scan Review  Patient Visit Type MedOnc  Treatment Phase Active Tx  Barriers/Navigation Needs Coordination of Care;Education  Interventions None Required  Acuity Level 2-Minimal Needs (1-2 Barriers Identified)  Support Groups/Services Friends and Family  Time Spent with Patient 15

## 2021-10-31 ENCOUNTER — Inpatient Hospital Stay: Payer: 59 | Attending: Hematology & Oncology

## 2021-10-31 ENCOUNTER — Inpatient Hospital Stay: Payer: 59

## 2021-10-31 ENCOUNTER — Encounter: Payer: Self-pay | Admitting: Hematology & Oncology

## 2021-10-31 ENCOUNTER — Inpatient Hospital Stay (HOSPITAL_BASED_OUTPATIENT_CLINIC_OR_DEPARTMENT_OTHER): Payer: 59 | Admitting: Hematology & Oncology

## 2021-10-31 ENCOUNTER — Encounter: Payer: Self-pay | Admitting: *Deleted

## 2021-10-31 VITALS — BP 129/56 | HR 66 | Temp 97.5°F | Resp 17 | Wt 184.0 lb

## 2021-10-31 DIAGNOSIS — D649 Anemia, unspecified: Secondary | ICD-10-CM

## 2021-10-31 DIAGNOSIS — C182 Malignant neoplasm of ascending colon: Secondary | ICD-10-CM | POA: Diagnosis present

## 2021-10-31 DIAGNOSIS — C787 Secondary malignant neoplasm of liver and intrahepatic bile duct: Secondary | ICD-10-CM

## 2021-10-31 DIAGNOSIS — C189 Malignant neoplasm of colon, unspecified: Secondary | ICD-10-CM

## 2021-10-31 DIAGNOSIS — R21 Rash and other nonspecific skin eruption: Secondary | ICD-10-CM | POA: Insufficient documentation

## 2021-10-31 DIAGNOSIS — Z5111 Encounter for antineoplastic chemotherapy: Secondary | ICD-10-CM | POA: Diagnosis not present

## 2021-10-31 DIAGNOSIS — R197 Diarrhea, unspecified: Secondary | ICD-10-CM | POA: Diagnosis not present

## 2021-10-31 DIAGNOSIS — D509 Iron deficiency anemia, unspecified: Secondary | ICD-10-CM | POA: Diagnosis not present

## 2021-10-31 DIAGNOSIS — Z452 Encounter for adjustment and management of vascular access device: Secondary | ICD-10-CM | POA: Insufficient documentation

## 2021-10-31 LAB — CMP (CANCER CENTER ONLY)
ALT: 10 U/L (ref 0–44)
AST: 10 U/L — ABNORMAL LOW (ref 15–41)
Albumin: 4.1 g/dL (ref 3.5–5.0)
Alkaline Phosphatase: 81 U/L (ref 38–126)
Anion gap: 8 (ref 5–15)
BUN: 11 mg/dL (ref 6–20)
CO2: 26 mmol/L (ref 22–32)
Calcium: 9.4 mg/dL (ref 8.9–10.3)
Chloride: 106 mmol/L (ref 98–111)
Creatinine: 0.46 mg/dL (ref 0.44–1.00)
GFR, Estimated: >60 mL/min (ref >60)
Glucose, Bld: 100 mg/dL — ABNORMAL HIGH (ref 70–99)
Potassium: 3.7 mmol/L (ref 3.5–5.1)
Sodium: 140 mmol/L (ref 135–145)
Total Bilirubin: 0.4 mg/dL (ref 0.3–1.2)
Total Protein: 6.5 g/dL (ref 6.5–8.1)

## 2021-10-31 LAB — CBC WITH DIFFERENTIAL (CANCER CENTER ONLY)
Abs Immature Granulocytes: 0.08 10*3/uL — ABNORMAL HIGH (ref 0.00–0.07)
Basophils Absolute: 0 10*3/uL (ref 0.0–0.1)
Basophils Relative: 0 %
Eosinophils Absolute: 0 10*3/uL (ref 0.0–0.5)
Eosinophils Relative: 0 %
HCT: 37.2 % (ref 36.0–46.0)
Hemoglobin: 11.5 g/dL — ABNORMAL LOW (ref 12.0–15.0)
Immature Granulocytes: 1 %
Lymphocytes Relative: 11 %
Lymphs Abs: 1.2 10*3/uL (ref 0.7–4.0)
MCH: 25.1 pg — ABNORMAL LOW (ref 26.0–34.0)
MCHC: 30.9 g/dL (ref 30.0–36.0)
MCV: 81 fL (ref 80.0–100.0)
Monocytes Absolute: 0.8 10*3/uL (ref 0.1–1.0)
Monocytes Relative: 8 %
Neutro Abs: 8.1 10*3/uL — ABNORMAL HIGH (ref 1.7–7.7)
Neutrophils Relative %: 80 %
Platelet Count: 253 10*3/uL (ref 150–400)
RBC: 4.59 MIL/uL (ref 3.87–5.11)
RDW: 16.8 % — ABNORMAL HIGH (ref 11.5–15.5)
WBC Count: 10.2 10*3/uL (ref 4.0–10.5)
nRBC: 0 % (ref 0.0–0.2)

## 2021-10-31 LAB — CEA (IN HOUSE-CHCC): CEA (CHCC-In House): 3.17 ng/mL (ref 0.00–5.00)

## 2021-10-31 LAB — TSH: TSH: 1.149 u[IU]/mL (ref 0.350–4.500)

## 2021-10-31 LAB — FERRITIN: Ferritin: 29 ng/mL (ref 11–307)

## 2021-10-31 LAB — LACTATE DEHYDROGENASE: LDH: 186 U/L (ref 98–192)

## 2021-10-31 MED ORDER — SODIUM CHLORIDE 0.9% FLUSH
10.0000 mL | INTRAVENOUS | Status: AC | PRN
  Administered 2021-10-31: 10 mL via INTRAVENOUS

## 2021-10-31 MED ORDER — HEPARIN SOD (PORK) LOCK FLUSH 100 UNIT/ML IV SOLN
500.0000 [IU] | Freq: Once | INTRAVENOUS | Status: AC
  Administered 2021-10-31: 500 [IU] via INTRAVENOUS

## 2021-10-31 NOTE — Addendum Note (Signed)
Addended by: [place] Morelle on: 10/31/2021 01:05 PM   Modules accepted: Orders

## 2021-10-31 NOTE — Progress Notes (Signed)
Hematology and Oncology Follow Up Visit  Rebecca Bullock [id number] [birth year] 56 y.o. 10/31/2021   Principle Diagnosis:  Metastatic colorectal cancer-liver metastasis- BRAF (+)   Current Therapy:        Status post cycle 1 of chemotherapy with FOLFOXIRI/Avastin Encorafenib/Vectibix -- started on 06/19/2021, s/p cycle #6 -- d/c on 10/31/2021 FOLFOXIRI -- start cycle #1 on 11/08/2021   Interim History:  Rebecca Bullock is here today for follow-up.  We did do a PET scan on her.  This was done yesterday.  Looks that the PET scan showed that she had more activity where she had the cancer in the right lobe of the liver and in the primary in the ascending colon.  There really was no growth of the liver mass.  It measured 8.4 x 7.4 cm.  However, the SUV was 12.4.  In the right colon, the SUV increased to 13.3 from 6.1.  As such, I think going to have to make a change in her protocol and get her back on chemotherapy if we want to try to shrink the cancer and try to get her to surgery for resection.  She is still bothered by the left shoulder.  She has surgery for this back in April, and she still having difficulty with recovery.  She still has a lot of pain and decreased range of motion.  She has had no change in bowel or bladder habits.  She has had no obvious nausea or vomiting.  She has had no bleeding.  She does have the rash from the Vectibix.  This is mostly on her face and upper chest.  She is on medication for this.  She has had no fever.  She has had no leg swelling.  Overall, I would say her performance status is probably ECOG 1.     Medications:  Allergies as of 10/31/2021       Reactions   Irinotecan Other (See Comments)   Flushing, tingling, visual disturbance   Metronidazole Diarrhea   Vancomycin Rash   ?red syndrome   Augmentin [amoxicillin-pot Clavulanate] Diarrhea   Extreme diarrhea, abdominal pain.        Medication List        Accurate as of October 31, 2021 12:30 PM. If  you have any questions, ask your nurse or doctor.          STOP taking these medications    amoxicillin-clavulanate 875-125 MG tablet Commonly known as: AUGMENTIN Stopped by: [name], MD       TAKE these medications    Acetaminophen 325 MG Caps Take 650 mg by mouth every 8 (eight) hours as needed for moderate pain or headache.   betamethasone valerate 0.1 % cream Commonly known as: VALISONE Apply 1 application topically See admin instructions. Apply 1 application topically every other day as needed for psoriasis flare   encorafenib 75 MG capsule Commonly known as: BRAFTOVI Take 4 capsules (300 mg total) by mouth daily. What changed: how much to take   estradiol 0.1 MG/GM vaginal cream Commonly known as: ESTRACE Place 1 Applicatorful vaginally daily as needed (dryness / irritation).   fluticasone 50 MCG/ACT nasal spray Commonly known as: FLONASE Place 2 sprays into both nostrils daily.   HYDROcodone-acetaminophen 5-325 MG tablet Commonly known as: NORCO/VICODIN Take 1 tablet by mouth every 4 (four) hours as needed for moderate pain.   hydrocortisone 1 % lotion Apply 1 application topically 2 (two) times daily as needed for itching.   omeprazole 20  MG tablet Commonly known as: PRILOSEC OTC Take 20 mg by mouth daily as needed (acid reflux).   predniSONE 5 MG tablet Commonly known as: DELTASONE Take 6 tablets by mouth daily on days 1 - 4, then take 4 tablets daily on days 5 - 8, then take 2 tablets daily on days 9 - 12.        Allergies:  Allergies  Allergen Reactions   Irinotecan Other (See Comments)    Flushing, tingling, visual disturbance   Metronidazole Diarrhea   Vancomycin Rash    ?red syndrome   Augmentin [Amoxicillin-Pot Clavulanate] Diarrhea    Extreme diarrhea, abdominal pain.    Past Medical History, Surgical history, Social history, and Family History were reviewed and updated.  Review of Systems: Review of Systems   Constitutional: Negative.   HENT: Negative.    Eyes: Negative.   Respiratory: Negative.    Cardiovascular: Negative.   Gastrointestinal: Negative.   Genitourinary: Negative.   Musculoskeletal: Negative.   Skin:  Positive for rash.  Neurological: Negative.   Endo/Heme/Allergies: Negative.   Psychiatric/Behavioral: Negative.      Physical Exam:  weight is 184 lb 0.6 oz (83.5 kg). Her oral temperature is 97.5 F (36.4 C) (abnormal). Her blood pressure is 129/56 (abnormal) and her pulse is 66. Her respiration is 17 and oxygen saturation is 100%.   Wt Readings from Last 3 Encounters:  10/31/21 184 lb 0.6 oz (83.5 kg)  10/16/21 182 lb (82.6 kg)  10/04/21 181 lb 1.3 oz (82.1 kg)    Physical Exam Vitals reviewed.  HENT:     Head: Normocephalic and atraumatic.  Eyes:     Pupils: Pupils are equal, round, and reactive to light.  Cardiovascular:     Rate and Rhythm: Normal rate and regular rhythm.     Heart sounds: Normal heart sounds.  Pulmonary:     Effort: Pulmonary effort is normal.     Breath sounds: Normal breath sounds.  Abdominal:     General: Bowel sounds are normal.     Palpations: Abdomen is soft.  Musculoskeletal:        General: No tenderness or deformity. Normal range of motion.     Cervical back: Normal range of motion.     Comments: She has arthroscopic wounds in the left shoulder.  She has sutures.  There is no erythema.  She has markedly limited range of motion of the left shoulder.  Lymphadenopathy:     Cervical: No cervical adenopathy.  Skin:    General: Skin is warm and dry.     Findings: No erythema or rash.  Neurological:     Mental Status: She is alert and oriented to person, place, and time.  Psychiatric:        Behavior: Behavior normal.        Thought Content: Thought content normal.        Judgment: Judgment normal.     Lab Results  Component Value Date   WBC 10.2 10/31/2021   HGB 11.5 (L) 10/31/2021   HCT 37.2 10/31/2021   MCV 81.0  10/31/2021   PLT 253 10/31/2021   Lab Results  Component Value Date   FERRITIN 9 (L) 10/04/2021   IRON 24 (L) 10/04/2021   TIBC 412 10/04/2021   UIBC 388 10/04/2021   IRONPCTSAT 6 (L) 10/04/2021   Lab Results  Component Value Date   RETICCTPCT 1.6 10/04/2021   RBC 4.59 10/31/2021   No results found for: KPAFRELGTCHN, LAMBDASER, KAPLAMBRATIO No  results found for: IGGSERUM, IGA, IGMSERUM No results found for: Odetta Pink, SPEI   Chemistry      Component Value Date/Time   NA 141 10/18/2021 1137   K 3.4 (L) 10/18/2021 1137   CL 106 10/18/2021 1137   CO2 27 10/18/2021 1137   BUN 10 10/18/2021 1137   CREATININE 0.43 (L) 10/18/2021 1137   CREATININE 0.56 03/23/2021 0000      Component Value Date/Time   CALCIUM 9.0 10/18/2021 1137   ALKPHOS 86 10/18/2021 1137   AST 10 (L) 10/18/2021 1137   ALT 8 10/18/2021 1137   BILITOT 0.2 (L) 10/18/2021 1137       Impression and Plan: Ms. Matchett is a very pleasant 56 yo caucasian female with metastatic colon cancer.  She has a liver met that is quite large.    Again, we will get have to get her back on chemotherapy.  We need to try to get some shrinkage so that we can try to get her tumor resected.  I think that the initial FOLFOX that she received did not seem to help her.  Again we made a change over to targeted therapy more found that she did have the BRAF mutation.  She comes in with her daughter.  I had a long talk with her about this.  I just hate the fact that we have to make a change to chemotherapy.  However, I want to be aggressive.  Of note, her CEA has been trending up.  She is always had a relatively low CEA for metastatic disease.  Her initial CEA was 7.  I went down to 1.  It now is a little over 2.  I think this is also evidence that her disease is more active.  I would like to give her 4 cycles of treatment.  If we can see a good response, then maybe we can get her to  surgery for resection.  I would like to get started with treatment next week.  I will then see her back in 3 weeks for her second cycle.    [name], MD 6/6/202312:30 PM

## 2021-10-31 NOTE — Patient Instructions (Signed)

## 2021-10-31 NOTE — Progress Notes (Signed)
Patient's PET showed increased activity and as such, she will restart chemotherapy. She did not get her maintenance immunotherapy today. She will come back next week to restart chemo.   Oncology Nurse Navigator Documentation     10/31/2021   12:45 PM  Oncology Nurse Navigator Flowsheets  Navigator Follow Up Date: 11/22/2021  Navigator Follow Up Reason: Follow-up Appointment;Chemotherapy  Navigator Location [facility]  Navigator Encounter Type Follow-up Appt;Appt/Treatment Plan Review  Patient Visit Type MedOnc  Treatment Phase Active Tx  Barriers/Navigation Needs Coordination of Care;Education  Interventions None Required  Acuity Level 2-Minimal Needs (1-2 Barriers Identified)  Support Groups/Services Friends and Family  Time Spent with Patient 15

## 2021-11-01 ENCOUNTER — Inpatient Hospital Stay: Payer: 59 | Admitting: Hematology & Oncology

## 2021-11-01 ENCOUNTER — Inpatient Hospital Stay: Payer: 59

## 2021-11-01 ENCOUNTER — Telehealth: Payer: Self-pay

## 2021-11-01 LAB — IRON AND IRON BINDING CAPACITY (CC-WL,HP ONLY)
Iron: 39 ug/dL (ref 28–170)
Saturation Ratios: 11 % (ref 10.4–31.8)
TIBC: 372 ug/dL (ref 250–450)
UIBC: 333 ug/dL (ref 148–442)

## 2021-11-01 NOTE — Telephone Encounter (Signed)
-----   Message from [name], MD sent at 10/31/2021  9:20 PM EDT ----- Call - the iron looks ok.  The CEA tumor marker is still creeping up.  It is now 3.17.  [name]

## 2021-11-01 NOTE — Telephone Encounter (Signed)
Called and informed patient of lab results, patient verbalized understanding and is wanting to know more about this chemotherapy and premeds. Message sent to nurse educator to f/u with pt before tx.

## 2021-11-01 NOTE — Telephone Encounter (Signed)
Called and informed patient of lab results, patient verbalized understanding and denies any questions or concerns at this time. Message sent to scheduling to set up 2 doses of Ferrlecit.

## 2021-11-02 ENCOUNTER — Encounter: Payer: Self-pay | Admitting: Hematology & Oncology

## 2021-11-02 ENCOUNTER — Encounter: Payer: Self-pay | Admitting: Family

## 2021-11-02 NOTE — Progress Notes (Unsigned)
Pharmacist Chemotherapy Monitoring - Initial Assessment    Anticipated start date: 11/07/21   The following has been reviewed per standard work regarding the patient's treatment regimen: The patient's diagnosis, treatment plan and drug doses, and organ/hematologic function Lab orders and baseline tests specific to treatment regimen  The treatment plan start date, drug sequencing, and pre-medications Prior authorization status  Patient's documented medication list, including drug-drug interaction screen and prescriptions for anti-emetics and supportive care specific to the treatment regimen The drug concentrations, fluid compatibility, administration routes, and timing of the medications to be used The patient's access for treatment and lifetime cumulative dose history, if applicable  The patient's medication allergies and previous infusion related reactions, if applicable   Changes made to treatment plan:  treatment plan date  Follow up needed:  N/A   Daaiyah Baumert, Jacqlyn Larsen, [facility], 11/02/2021  1:04 PM

## 2021-11-06 ENCOUNTER — Other Ambulatory Visit: Payer: Self-pay | Admitting: *Deleted

## 2021-11-06 DIAGNOSIS — C189 Malignant neoplasm of colon, unspecified: Secondary | ICD-10-CM

## 2021-11-06 MED ORDER — PROCHLORPERAZINE MALEATE 10 MG PO TABS: 10.0000 mg | ORAL_TABLET | Freq: Four times a day (QID) | ORAL | 1 refills | Status: DC | PRN

## 2021-11-06 MED ORDER — ONDANSETRON HCL 8 MG PO TABS: 8.0000 mg | ORAL_TABLET | Freq: Two times a day (BID) | ORAL | 1 refills | Status: DC | PRN

## 2021-11-06 MED ORDER — DEXAMETHASONE 4 MG PO TABS: 8.0000 mg | ORAL_TABLET | Freq: Every day | ORAL | 5 refills | Status: DC

## 2021-11-07 ENCOUNTER — Inpatient Hospital Stay: Payer: 59

## 2021-11-07 ENCOUNTER — Other Ambulatory Visit: Payer: Self-pay | Admitting: Oncology

## 2021-11-07 VITALS — BP 117/60 | HR 81 | Temp 97.9°F | Resp 17

## 2021-11-07 DIAGNOSIS — F411 Generalized anxiety disorder: Secondary | ICD-10-CM

## 2021-11-07 DIAGNOSIS — D649 Anemia, unspecified: Secondary | ICD-10-CM

## 2021-11-07 DIAGNOSIS — C189 Malignant neoplasm of colon, unspecified: Secondary | ICD-10-CM

## 2021-11-07 DIAGNOSIS — Z5111 Encounter for antineoplastic chemotherapy: Secondary | ICD-10-CM | POA: Diagnosis not present

## 2021-11-07 DIAGNOSIS — F419 Anxiety disorder, unspecified: Secondary | ICD-10-CM

## 2021-11-07 DIAGNOSIS — D509 Iron deficiency anemia, unspecified: Secondary | ICD-10-CM

## 2021-11-07 LAB — RETICULOCYTES
Immature Retic Fract: 12.4 % (ref 2.3–15.9)
RBC.: 4.59 MIL/uL (ref 3.87–5.11)
Retic Count, Absolute: 84 10*3/uL (ref 19.0–186.0)
Retic Ct Pct: 1.8 % (ref 0.4–3.1)

## 2021-11-07 LAB — CMP (CANCER CENTER ONLY)
ALT: 25 U/L (ref 0–44)
AST: 19 U/L (ref 15–41)
Albumin: 3.8 g/dL (ref 3.5–5.0)
Alkaline Phosphatase: 94 U/L (ref 38–126)
Anion gap: 8 (ref 5–15)
BUN: 9 mg/dL (ref 6–20)
CO2: 28 mmol/L (ref 22–32)
Calcium: 9 mg/dL (ref 8.9–10.3)
Chloride: 105 mmol/L (ref 98–111)
Creatinine: 0.47 mg/dL (ref 0.44–1.00)
GFR, Estimated: >60 mL/min (ref >60)
Glucose, Bld: 117 mg/dL — ABNORMAL HIGH (ref 70–99)
Potassium: 3.6 mmol/L (ref 3.5–5.1)
Sodium: 141 mmol/L (ref 135–145)
Total Bilirubin: 0.3 mg/dL (ref 0.3–1.2)
Total Protein: 6.1 g/dL — ABNORMAL LOW (ref 6.5–8.1)

## 2021-11-07 LAB — CBC WITH DIFFERENTIAL (CANCER CENTER ONLY)
Abs Immature Granulocytes: 0.04 10*3/uL (ref 0.00–0.07)
Basophils Absolute: 0 10*3/uL (ref 0.0–0.1)
Basophils Relative: 1 %
Eosinophils Absolute: 0.3 10*3/uL (ref 0.0–0.5)
Eosinophils Relative: 5 %
HCT: 37.5 % (ref 36.0–46.0)
Hemoglobin: 11.7 g/dL — ABNORMAL LOW (ref 12.0–15.0)
Immature Granulocytes: 1 %
Lymphocytes Relative: 26 %
Lymphs Abs: 2 10*3/uL (ref 0.7–4.0)
MCH: 25.4 pg — ABNORMAL LOW (ref 26.0–34.0)
MCHC: 31.2 g/dL (ref 30.0–36.0)
MCV: 81.3 fL (ref 80.0–100.0)
Monocytes Absolute: 0.5 10*3/uL (ref 0.1–1.0)
Monocytes Relative: 7 %
Neutro Abs: 4.6 10*3/uL (ref 1.7–7.7)
Neutrophils Relative %: 60 %
Platelet Count: 247 10*3/uL (ref 150–400)
RBC: 4.61 MIL/uL (ref 3.87–5.11)
RDW: 16.8 % — ABNORMAL HIGH (ref 11.5–15.5)
WBC Count: 7.5 10*3/uL (ref 4.0–10.5)
nRBC: 0 % (ref 0.0–0.2)

## 2021-11-07 LAB — FERRITIN: Ferritin: 25 ng/mL (ref 11–307)

## 2021-11-07 LAB — IRON AND IRON BINDING CAPACITY (CC-WL,HP ONLY)
Iron: 36 ug/dL (ref 28–170)
Saturation Ratios: 10 % — ABNORMAL LOW (ref 10.4–31.8)
TIBC: 363 ug/dL (ref 250–450)
UIBC: 327 ug/dL (ref 148–442)

## 2021-11-07 LAB — CEA (IN HOUSE-CHCC): CEA (CHCC-In House): 3.95 ng/mL (ref 0.00–5.00)

## 2021-11-07 MED ORDER — LEUCOVORIN CALCIUM INJECTION 350 MG
400.0000 mg/m2 | Freq: Once | INTRAVENOUS | Status: AC
  Administered 2021-11-07: 760 mg via INTRAVENOUS
  Filled 2021-11-07: qty 25

## 2021-11-07 MED ORDER — SODIUM CHLORIDE 0.9 % IV SOLN
148.5000 mg/m2 | Freq: Once | INTRAVENOUS | Status: AC
  Administered 2021-11-07: 280 mg via INTRAVENOUS
  Filled 2021-11-07: qty 10

## 2021-11-07 MED ORDER — ATROPINE SULFATE 1 MG/ML IV SOLN
0.5000 mg | Freq: Once | INTRAVENOUS | Status: AC | PRN
  Administered 2021-11-07: 0.5 mg via INTRAVENOUS
  Filled 2021-11-07: qty 1

## 2021-11-07 MED ORDER — DEXTROSE 5 % IV SOLN: Freq: Once | INTRAVENOUS | Status: AC

## 2021-11-07 MED ORDER — SODIUM CHLORIDE 0.9 % IV SOLN
150.0000 mg | Freq: Once | INTRAVENOUS | Status: AC
  Administered 2021-11-07: 150 mg via INTRAVENOUS
  Filled 2021-11-07: qty 150

## 2021-11-07 MED ORDER — PALONOSETRON HCL INJECTION 0.25 MG/5ML
0.2500 mg | Freq: Once | INTRAVENOUS | Status: AC
  Administered 2021-11-07: 0.25 mg via INTRAVENOUS
  Filled 2021-11-07: qty 5

## 2021-11-07 MED ORDER — HEPARIN SOD (PORK) LOCK FLUSH 100 UNIT/ML IV SOLN: 500.0000 [IU] | Freq: Once | INTRAVENOUS | Status: DC | PRN

## 2021-11-07 MED ORDER — SODIUM CHLORIDE 0.9 % IV SOLN: Freq: Once | INTRAVENOUS | Status: AC

## 2021-11-07 MED ORDER — OXALIPLATIN CHEMO INJECTION 100 MG/20ML
76.5000 mg/m2 | Freq: Once | INTRAVENOUS | Status: AC
  Administered 2021-11-07: 145 mg via INTRAVENOUS
  Filled 2021-11-07: qty 20

## 2021-11-07 MED ORDER — SODIUM CHLORIDE 0.9% FLUSH: 10.0000 mL | INTRAVENOUS | Status: DC | PRN

## 2021-11-07 MED ORDER — SODIUM CHLORIDE 0.9 % IV SOLN
2880.0000 mg/m2 | INTRAVENOUS | Status: DC
  Administered 2021-11-07: 5450 mg via INTRAVENOUS
  Filled 2021-11-07: qty 100

## 2021-11-07 MED ORDER — SODIUM CHLORIDE 0.9 % IV SOLN
10.0000 mg | Freq: Once | INTRAVENOUS | Status: AC
  Administered 2021-11-07: 10 mg via INTRAVENOUS
  Filled 2021-11-07: qty 10

## 2021-11-07 MED ORDER — LORAZEPAM 2 MG/ML IJ SOLN
0.5000 mg | Freq: Once | INTRAMUSCULAR | Status: AC
  Administered 2021-11-07: 0.5 mg via INTRAVENOUS
  Filled 2021-11-07: qty 1

## 2021-11-07 MED ORDER — SODIUM CHLORIDE 0.9 % IV SOLN
125.0000 mg | Freq: Once | INTRAVENOUS | Status: AC
  Administered 2021-11-07: 125 mg via INTRAVENOUS
  Filled 2021-11-07: qty 125

## 2021-11-07 NOTE — Patient Instructions (Signed)
[facility] [place]  Discharge Instructions: Thank you for choosing [facility] to provide your oncology and hematology care.   If you have a lab appointment with the [facility], please go directly to the [facility] and check in at the registration area.  Wear comfortable clothing and clothing appropriate for easy access to any Portacath or PICC line.   We strive to give you quality time with your provider. You may need to reschedule your appointment if you arrive late (15 or more minutes).  Arriving late affects you and other patients whose appointments are after yours.  Also, if you miss three or more appointments without notifying the office, you may be dismissed from the clinic at the provider's discretion.      For prescription refill requests, have your pharmacy contact our office and allow 72 hours for refills to be completed.    Today you received the following chemotherapy and/or immunotherapy agents Folfirinox      To help prevent nausea and vomiting after your treatment, we encourage you to take your nausea medication as directed.  BELOW ARE SYMPTOMS THAT SHOULD BE REPORTED IMMEDIATELY: *FEVER GREATER THAN 100.4 F (38 C) OR HIGHER *CHILLS OR SWEATING *NAUSEA AND VOMITING THAT IS NOT CONTROLLED WITH YOUR NAUSEA MEDICATION *UNUSUAL SHORTNESS OF BREATH *UNUSUAL BRUISING OR BLEEDING *URINARY PROBLEMS (pain or burning when urinating, or frequent urination) *BOWEL PROBLEMS (unusual diarrhea, constipation, pain near the anus) TENDERNESS IN MOUTH AND THROAT WITH OR WITHOUT PRESENCE OF ULCERS (sore throat, sores in mouth, or a toothache) UNUSUAL RASH, SWELLING OR PAIN  UNUSUAL VAGINAL DISCHARGE OR ITCHING   Items with * indicate a potential emergency and should be followed up as soon as possible or go to the Emergency Department if any problems should occur.  Please show the CHEMOTHERAPY ALERT CARD or IMMUNOTHERAPY ALERT CARD at check-in to the  Emergency Department and triage nurse. Should you have questions after your visit or need to cancel or reschedule your appointment, please contact [facility]  [phone] and follow the prompts.  Office hours are 8:00 a.m. to 4:30 p.m. Monday - Friday. Please note that voicemails left after 4:00 p.m. may not be returned until the following business day.  We are closed weekends and major holidays. You have access to a nurse at all times for urgent questions. Please call the main number to the clinic [phone] and follow the prompts.  For any non-urgent questions, you may also contact your provider using MyChart. We now offer e-Visits for anyone 48 and older to request care online for non-urgent symptoms. For details visit [email or web address].   Also download the MyChart app! Go to the app store, search "MyChart", open the app, select [facility], and log in with your MyChart username and password.  Masks are optional in the cancer centers. If you would like for your care team to wear a mask while they are taking care of you, please let them know. For doctor visits, patients may have with them one support person who is at least 54 years old. At this time, visitors are not allowed in the infusion area.

## 2021-11-09 ENCOUNTER — Inpatient Hospital Stay: Payer: 59

## 2021-11-09 ENCOUNTER — Encounter: Payer: Self-pay | Admitting: *Deleted

## 2021-11-09 DIAGNOSIS — C189 Malignant neoplasm of colon, unspecified: Secondary | ICD-10-CM

## 2021-11-09 DIAGNOSIS — Z5111 Encounter for antineoplastic chemotherapy: Secondary | ICD-10-CM | POA: Diagnosis not present

## 2021-11-09 MED ORDER — HEPARIN SOD (PORK) LOCK FLUSH 100 UNIT/ML IV SOLN
500.0000 [IU] | Freq: Once | INTRAVENOUS | Status: AC | PRN
  Administered 2021-11-09: 500 [IU]

## 2021-11-09 MED ORDER — SODIUM CHLORIDE 0.9% FLUSH
10.0000 mL | INTRAVENOUS | Status: DC | PRN
  Administered 2021-11-09: 10 mL

## 2021-11-09 NOTE — Progress Notes (Signed)
Patient received a bill from [facility] for $3500.   I reached out to FO to inquire about the bill. They stated that they had exhausted all appeals and that [facility] can still denying payment. The $3500 is the discounted patient self pay price. They suggested that patient fill out financial assistance form and see about getting the bill reduced.   Returned the [employer], my email explanation and the patient assistance form to the patient and instructed her to fill out the form and return to [facility]. She appreciated the help.   Oncology Nurse Navigator Documentation     11/09/2021    2:00 PM  Oncology Nurse Navigator Flowsheets  Navigator Follow Up Date: 11/22/2021  Navigator Follow Up Reason: Follow-up Appointment;Chemotherapy  Navigator Restaurant manager, fast food Encounter Type Treatment  Patient Visit Type MedOnc  Treatment Phase Active Tx  Barriers/Navigation Needs Coordination of Care;Education  Interventions Other  Acuity Level 2-Minimal Needs (1-2 Barriers Identified)  Support Groups/Services Friends and Family  Time Spent with Patient 30

## 2021-11-09 NOTE — Patient Instructions (Signed)

## 2021-11-13 ENCOUNTER — Telehealth: Payer: Self-pay | Admitting: *Deleted

## 2021-11-13 ENCOUNTER — Other Ambulatory Visit: Payer: Self-pay | Admitting: *Deleted

## 2021-11-13 ENCOUNTER — Other Ambulatory Visit (HOSPITAL_BASED_OUTPATIENT_CLINIC_OR_DEPARTMENT_OTHER): Payer: Self-pay

## 2021-11-13 ENCOUNTER — Inpatient Hospital Stay: Payer: 59

## 2021-11-13 ENCOUNTER — Other Ambulatory Visit: Payer: Self-pay | Admitting: Lab

## 2021-11-13 VITALS — BP 130/72 | HR 83 | Temp 98.1°F | Resp 17

## 2021-11-13 DIAGNOSIS — D649 Anemia, unspecified: Secondary | ICD-10-CM

## 2021-11-13 DIAGNOSIS — D509 Iron deficiency anemia, unspecified: Secondary | ICD-10-CM

## 2021-11-13 DIAGNOSIS — Z5111 Encounter for antineoplastic chemotherapy: Secondary | ICD-10-CM | POA: Diagnosis not present

## 2021-11-13 LAB — CBC WITH DIFFERENTIAL (CANCER CENTER ONLY)
Abs Immature Granulocytes: 0.03 10*3/uL (ref 0.00–0.07)
Basophils Absolute: 0 10*3/uL (ref 0.0–0.1)
Basophils Relative: 0 %
Eosinophils Absolute: 0.1 10*3/uL (ref 0.0–0.5)
Eosinophils Relative: 3 %
HCT: 38.5 % (ref 36.0–46.0)
Hemoglobin: 12.2 g/dL (ref 12.0–15.0)
Immature Granulocytes: 1 %
Lymphocytes Relative: 26 %
Lymphs Abs: 1.5 10*3/uL (ref 0.7–4.0)
MCH: 25.7 pg — ABNORMAL LOW (ref 26.0–34.0)
MCHC: 31.7 g/dL (ref 30.0–36.0)
MCV: 81.1 fL (ref 80.0–100.0)
Monocytes Absolute: 0.2 10*3/uL (ref 0.1–1.0)
Monocytes Relative: 3 %
Neutro Abs: 3.8 10*3/uL (ref 1.7–7.7)
Neutrophils Relative %: 67 %
Platelet Count: 216 10*3/uL (ref 150–400)
RBC: 4.75 MIL/uL (ref 3.87–5.11)
RDW: 15.7 % — ABNORMAL HIGH (ref 11.5–15.5)
WBC Count: 5.6 10*3/uL (ref 4.0–10.5)
nRBC: 0 % (ref 0.0–0.2)

## 2021-11-13 LAB — COMPREHENSIVE METABOLIC PANEL
ALT: 20 U/L (ref 0–44)
AST: 16 U/L (ref 15–41)
Albumin: 4.1 g/dL (ref 3.5–5.0)
Alkaline Phosphatase: 118 U/L (ref 38–126)
Anion gap: 10 (ref 5–15)
BUN: 10 mg/dL (ref 6–20)
CO2: 26 mmol/L (ref 22–32)
Calcium: 9.1 mg/dL (ref 8.9–10.3)
Chloride: 102 mmol/L (ref 98–111)
Creatinine, Ser: 0.47 mg/dL (ref 0.44–1.00)
GFR, Estimated: >60 mL/min (ref >60)
Glucose, Bld: 91 mg/dL (ref 70–99)
Potassium: 3.5 mmol/L (ref 3.5–5.1)
Sodium: 138 mmol/L (ref 135–145)
Total Bilirubin: 0.3 mg/dL (ref 0.3–1.2)
Total Protein: 7.2 g/dL (ref 6.5–8.1)

## 2021-11-13 MED ORDER — LORAZEPAM 0.5 MG PO TABS
0.5000 mg | ORAL_TABLET | Freq: Four times a day (QID) | ORAL | 0 refills | Status: DC | PRN
  Filled 2021-11-13: qty 30, 8d supply, fill #0

## 2021-11-13 MED ORDER — SODIUM CHLORIDE 0.9 % IV SOLN
125.0000 mg | Freq: Once | INTRAVENOUS | Status: AC
  Administered 2021-11-13: 125 mg via INTRAVENOUS
  Filled 2021-11-13: qty 125

## 2021-11-13 MED ORDER — SODIUM CHLORIDE 0.9% FLUSH: 10.0000 mL | Freq: Once | INTRAVENOUS | Status: DC | PRN

## 2021-11-13 MED ORDER — SODIUM CHLORIDE 0.9 % IV SOLN: Freq: Once | INTRAVENOUS | Status: DC

## 2021-11-13 MED ORDER — DIPHENOXYLATE-ATROPINE 2.5-0.025 MG PO TABS
2.0000 | ORAL_TABLET | Freq: Once | ORAL | Status: AC
  Administered 2021-11-13: 2 via ORAL
  Filled 2021-11-13: qty 2

## 2021-11-13 MED ORDER — SODIUM CHLORIDE 0.9 % IV SOLN: Freq: Once | INTRAVENOUS | Status: AC

## 2021-11-13 MED ORDER — DIPHENOXYLATE-ATROPINE 2.5-0.025 MG PO TABS
2.0000 | ORAL_TABLET | Freq: Four times a day (QID) | ORAL | 0 refills | Status: DC | PRN
  Filled 2021-11-13: qty 30, 4d supply, fill #0

## 2021-11-13 NOTE — Telephone Encounter (Signed)
PAtient called very tearful stating that she has not felt well at all since last weeks chemo.  Has struggled with diarrhea in spite of taking Imodium.  Minimal nausea but she is afraid to eat because afraid she will have diarrhea.  Dr [name] notified.  Patient coming in for labs and IVF

## 2021-11-13 NOTE — Patient Instructions (Signed)

## 2021-11-14 ENCOUNTER — Ambulatory Visit: Payer: 59

## 2021-11-14 ENCOUNTER — Inpatient Hospital Stay: Payer: 59

## 2021-11-15 ENCOUNTER — Telehealth: Payer: Self-pay

## 2021-11-15 NOTE — Telephone Encounter (Signed)
Received call from pt stating that she continues to have diarrhea despite taking Lomotil qid. Pt reports multiple episodes today, immediately after eating. Per Dr [name], pt to alternate Kaopectate with Lomotil. Pt declines IVF today & states she is pushing PO fluids. Will contact office with an update as needed. dph

## 2021-11-22 ENCOUNTER — Other Ambulatory Visit: Payer: Self-pay

## 2021-11-22 ENCOUNTER — Inpatient Hospital Stay (HOSPITAL_BASED_OUTPATIENT_CLINIC_OR_DEPARTMENT_OTHER): Payer: 59 | Admitting: Hematology & Oncology

## 2021-11-22 ENCOUNTER — Inpatient Hospital Stay: Payer: 59

## 2021-11-22 ENCOUNTER — Other Ambulatory Visit (HOSPITAL_BASED_OUTPATIENT_CLINIC_OR_DEPARTMENT_OTHER): Payer: Self-pay

## 2021-11-22 ENCOUNTER — Encounter: Payer: Self-pay | Admitting: Hematology & Oncology

## 2021-11-22 ENCOUNTER — Other Ambulatory Visit: Payer: Self-pay | Admitting: *Deleted

## 2021-11-22 ENCOUNTER — Encounter: Payer: Self-pay | Admitting: *Deleted

## 2021-11-22 VITALS — BP 125/65 | HR 83 | Temp 97.5°F | Resp 17 | Wt 180.0 lb

## 2021-11-22 DIAGNOSIS — C189 Malignant neoplasm of colon, unspecified: Secondary | ICD-10-CM

## 2021-11-22 DIAGNOSIS — C182 Malignant neoplasm of ascending colon: Secondary | ICD-10-CM

## 2021-11-22 DIAGNOSIS — C787 Secondary malignant neoplasm of liver and intrahepatic bile duct: Secondary | ICD-10-CM

## 2021-11-22 DIAGNOSIS — D649 Anemia, unspecified: Secondary | ICD-10-CM

## 2021-11-22 DIAGNOSIS — Z5111 Encounter for antineoplastic chemotherapy: Secondary | ICD-10-CM | POA: Diagnosis not present

## 2021-11-22 DIAGNOSIS — D509 Iron deficiency anemia, unspecified: Secondary | ICD-10-CM

## 2021-11-22 LAB — CMP (CANCER CENTER ONLY)
ALT: 13 U/L (ref 0–44)
AST: 13 U/L — ABNORMAL LOW (ref 15–41)
Albumin: 3.8 g/dL (ref 3.5–5.0)
Alkaline Phosphatase: 106 U/L (ref 38–126)
Anion gap: 8 (ref 5–15)
BUN: <5 mg/dL — ABNORMAL LOW (ref 6–20)
CO2: 28 mmol/L (ref 22–32)
Calcium: 9.1 mg/dL (ref 8.9–10.3)
Chloride: 106 mmol/L (ref 98–111)
Creatinine: 0.47 mg/dL (ref 0.44–1.00)
GFR, Estimated: >60 mL/min (ref >60)
Glucose, Bld: 117 mg/dL — ABNORMAL HIGH (ref 70–99)
Potassium: 3.1 mmol/L — ABNORMAL LOW (ref 3.5–5.1)
Sodium: 142 mmol/L (ref 135–145)
Total Bilirubin: 0.3 mg/dL (ref 0.3–1.2)
Total Protein: 6.3 g/dL — ABNORMAL LOW (ref 6.5–8.1)

## 2021-11-22 LAB — CBC WITH DIFFERENTIAL (CANCER CENTER ONLY)
Abs Immature Granulocytes: 0.01 10*3/uL (ref 0.00–0.07)
Basophils Absolute: 0 10*3/uL (ref 0.0–0.1)
Basophils Relative: 0 %
Eosinophils Absolute: 0.4 10*3/uL (ref 0.0–0.5)
Eosinophils Relative: 12 %
HCT: 34.6 % — ABNORMAL LOW (ref 36.0–46.0)
Hemoglobin: 10.6 g/dL — ABNORMAL LOW (ref 12.0–15.0)
Immature Granulocytes: 0 %
Lymphocytes Relative: 37 %
Lymphs Abs: 1.1 10*3/uL (ref 0.7–4.0)
MCH: 25.1 pg — ABNORMAL LOW (ref 26.0–34.0)
MCHC: 30.6 g/dL (ref 30.0–36.0)
MCV: 81.8 fL (ref 80.0–100.0)
Monocytes Absolute: 0.3 10*3/uL (ref 0.1–1.0)
Monocytes Relative: 9 %
Neutro Abs: 1.2 10*3/uL — ABNORMAL LOW (ref 1.7–7.7)
Neutrophils Relative %: 42 %
Platelet Count: 195 10*3/uL (ref 150–400)
RBC: 4.23 MIL/uL (ref 3.87–5.11)
RDW: 16.3 % — ABNORMAL HIGH (ref 11.5–15.5)
WBC Count: 2.8 10*3/uL — ABNORMAL LOW (ref 4.0–10.5)
nRBC: 0 % (ref 0.0–0.2)

## 2021-11-22 LAB — IRON AND IRON BINDING CAPACITY (CC-WL,HP ONLY)
Iron: 29 ug/dL (ref 28–170)
Saturation Ratios: 10 % — ABNORMAL LOW (ref 10.4–31.8)
TIBC: 307 ug/dL (ref 250–450)
UIBC: 278 ug/dL (ref 148–442)

## 2021-11-22 LAB — FERRITIN: Ferritin: 105 ng/mL (ref 11–307)

## 2021-11-22 MED ORDER — SODIUM CHLORIDE 0.9 % IV SOLN
133.6500 mg/m2 | Freq: Once | INTRAVENOUS | Status: AC
  Administered 2021-11-22: 260 mg via INTRAVENOUS
  Filled 2021-11-22: qty 10

## 2021-11-22 MED ORDER — DEXTROSE 5 % IV SOLN: Freq: Once | INTRAVENOUS | Status: AC

## 2021-11-22 MED ORDER — PALONOSETRON HCL INJECTION 0.25 MG/5ML
0.2500 mg | Freq: Once | INTRAVENOUS | Status: AC
  Administered 2021-11-22: 0.25 mg via INTRAVENOUS
  Filled 2021-11-22: qty 5

## 2021-11-22 MED ORDER — SODIUM CHLORIDE 0.9 % IV SOLN
2880.0000 mg/m2 | INTRAVENOUS | Status: DC
  Administered 2021-11-22: 5450 mg via INTRAVENOUS
  Filled 2021-11-22: qty 109

## 2021-11-22 MED ORDER — ATROPINE SULFATE 1 MG/ML IV SOLN
0.5000 mg | Freq: Once | INTRAVENOUS | Status: AC | PRN
  Administered 2021-11-22: 0.5 mg via INTRAVENOUS
  Filled 2021-11-22: qty 1

## 2021-11-22 MED ORDER — OXALIPLATIN CHEMO INJECTION 100 MG/20ML
76.5000 mg/m2 | Freq: Once | INTRAVENOUS | Status: AC
  Administered 2021-11-22: 145 mg via INTRAVENOUS
  Filled 2021-11-22: qty 20

## 2021-11-22 MED ORDER — DIPHENOXYLATE-ATROPINE 2.5-0.025 MG PO TABS
2.0000 | ORAL_TABLET | Freq: Four times a day (QID) | ORAL | 0 refills | Status: DC | PRN
  Filled 2021-11-22: qty 100, 13d supply, fill #0

## 2021-11-22 MED ORDER — LEUCOVORIN CALCIUM INJECTION 350 MG
400.0000 mg/m2 | Freq: Once | INTRAVENOUS | Status: AC
  Administered 2021-11-22: 760 mg via INTRAVENOUS
  Filled 2021-11-22: qty 38

## 2021-11-22 MED ORDER — SODIUM CHLORIDE 0.9 % IV SOLN
150.0000 mg | Freq: Once | INTRAVENOUS | Status: AC
  Administered 2021-11-22: 150 mg via INTRAVENOUS
  Filled 2021-11-22: qty 150

## 2021-11-22 MED ORDER — SODIUM CHLORIDE 0.9 % IV SOLN
10.0000 mg | Freq: Once | INTRAVENOUS | Status: AC
  Administered 2021-11-22: 10 mg via INTRAVENOUS
  Filled 2021-11-22: qty 10

## 2021-11-22 NOTE — Progress Notes (Signed)
Per dr ennever, okay to treat today despite labs. 

## 2021-11-22 NOTE — Patient Instructions (Signed)
[facility] [place]  Discharge Instructions: Thank you for choosing [facility] to provide your oncology and hematology care.   If you have a lab appointment with the [facility], please go directly to the [facility] and check in at the registration area.  Wear comfortable clothing and clothing appropriate for easy access to any Portacath or PICC line.   We strive to give you quality time with your provider. You may need to reschedule your appointment if you arrive late (15 or more minutes).  Arriving late affects you and other patients whose appointments are after yours.  Also, if you miss three or more appointments without notifying the office, you may be dismissed from the clinic at the provider's discretion.      For prescription refill requests, have your pharmacy contact our office and allow 72 hours for refills to be completed.    Today you received the following chemotherapy and/or immunotherapy agents 5-fu, leucovorin, oxaliplatin, cpt-11    To help prevent nausea and vomiting after your treatment, we encourage you to take your nausea medication as directed.  BELOW ARE SYMPTOMS THAT SHOULD BE REPORTED IMMEDIATELY: *FEVER GREATER THAN 100.4 F (38 C) OR HIGHER *CHILLS OR SWEATING *NAUSEA AND VOMITING THAT IS NOT CONTROLLED WITH YOUR NAUSEA MEDICATION *UNUSUAL SHORTNESS OF BREATH *UNUSUAL BRUISING OR BLEEDING *URINARY PROBLEMS (pain or burning when urinating, or frequent urination) *BOWEL PROBLEMS (unusual diarrhea, constipation, pain near the anus) TENDERNESS IN MOUTH AND THROAT WITH OR WITHOUT PRESENCE OF ULCERS (sore throat, sores in mouth, or a toothache) UNUSUAL RASH, SWELLING OR PAIN  UNUSUAL VAGINAL DISCHARGE OR ITCHING   Items with * indicate a potential emergency and should be followed up as soon as possible or go to the Emergency Department if any problems should occur.  Please show the CHEMOTHERAPY ALERT CARD or IMMUNOTHERAPY ALERT  CARD at check-in to the Emergency Department and triage nurse. Should you have questions after your visit or need to cancel or reschedule your appointment, please contact [facility]  [phone] and follow the prompts.  Office hours are 8:00 a.m. to 4:30 p.m. Monday - Friday. Please note that voicemails left after 4:00 p.m. may not be returned until the following business day.  We are closed weekends and major holidays. You have access to a nurse at all times for urgent questions. Please call the main number to the clinic [phone] and follow the prompts.  For any non-urgent questions, you may also contact your provider using MyChart. We now offer e-Visits for anyone 70 and older to request care online for non-urgent symptoms. For details visit [email or web address].   Also download the MyChart app! Go to the app store, search "MyChart", open the app, select , and log in with your MyChart username and password.  Masks are optional in the cancer centers. If you would like for your care team to wear a mask while they are taking care of you, please let them know. For doctor visits, patients may have with them one support person who is at least 54 years old. At this time, visitors are not allowed in the infusion area.

## 2021-11-22 NOTE — Patient Instructions (Signed)

## 2021-11-22 NOTE — Progress Notes (Signed)
Hematology and Oncology Follow Up Visit  Rebecca Bullock [id number] 10/27/1965 56 y.o. 11/22/2021   Principle Diagnosis:  Metastatic colorectal cancer-liver metastasis- BRAF (+)   Current Therapy:        Status post cycle 1 of chemotherapy with FOLFOXIRI/Avastin Encorafenib/Vectibix -- started on 06/19/2021, s/p cycle #6 -- d/c on 10/31/2021 FOLFOXIRI -- s/p cycle #1 -- start on 11/08/2021   Interim History:  Rebecca Bullock is here today for follow-up.  She had a little bit of a problem with diarrhea.  The Lomotil seem to help her out.  She does not have as much of a skin rash.  We are holding the Vectibix right now.  She is not having any abdominal pain.  There is been no problems with cough or shortness of breath.  There is been no leg swelling.  Her appetite has been pretty good.  Her daughter's birthday is tomorrow.  Hopefully, she will be able to have a nice celebration.  Her last CEA level was 4.  Currently, I would say performance status is probably ECOG 1.     Medications:  Allergies as of 11/22/2021       Reactions   Irinotecan Other (See Comments)   Flushing, tingling, visual disturbance   Metronidazole Diarrhea   Vancomycin Rash   ?red syndrome   Augmentin [amoxicillin-pot Clavulanate] Diarrhea   Extreme diarrhea, abdominal pain.        Medication List        Accurate as of November 22, 2021  9:03 AM. If you have any questions, ask your nurse or doctor.          Acetaminophen 325 MG Caps Take 650 mg by mouth every 8 (eight) hours as needed for moderate pain or headache.   betamethasone valerate 0.1 % cream Commonly known as: VALISONE Apply 1 application topically See admin instructions. Apply 1 application topically every other day as needed for psoriasis flare   dexamethasone 4 MG tablet Commonly known as: DECADRON Take 2 tablets (8 mg total) by mouth daily. Start the day after chemotherapy for 3 days. Take with food.   diphenoxylate-atropine 2.5-0.025  MG tablet Commonly known as: LOMOTIL Take 2 tablets by mouth 4 (four) times daily as needed for diarrhea or loose stools.   encorafenib 75 MG capsule Commonly known as: BRAFTOVI Take 4 capsules (300 mg total) by mouth daily. What changed: how much to take   estradiol 0.1 MG/GM vaginal cream Commonly known as: ESTRACE Place 1 Applicatorful vaginally daily as needed (dryness / irritation).   fluticasone 50 MCG/ACT nasal spray Commonly known as: FLONASE Place 2 sprays into both nostrils daily.   HYDROcodone-acetaminophen 5-325 MG tablet Commonly known as: NORCO/VICODIN Take 1 tablet by mouth every 4 (four) hours as needed for moderate pain.   hydrocortisone 1 % lotion Apply 1 application topically 2 (two) times daily as needed for itching.   LORazepam 0.5 MG tablet Commonly known as: ATIVAN Take 1 tablet (0.5 mg total) by mouth every 6 (six) hours as needed for anxiety.   omeprazole 20 MG tablet Commonly known as: PRILOSEC OTC Take 20 mg by mouth daily as needed (acid reflux).   ondansetron 8 MG tablet Commonly known as: Zofran Take 1 tablet (8 mg total) by mouth 2 (two) times daily as needed. Start on day 3 after chemotherapy.   predniSONE 5 MG tablet Commonly known as: DELTASONE Take 6 tablets by mouth daily on days 1 - 4, then take 4 tablets daily on days 5 -  8, then take 2 tablets daily on days 9 - 12.   prochlorperazine 10 MG tablet Commonly known as: COMPAZINE Take 1 tablet (10 mg total) by mouth every 6 (six) hours as needed (Nausea or vomiting).        Allergies:  Allergies  Allergen Reactions   Irinotecan Other (See Comments)    Flushing, tingling, visual disturbance   Metronidazole Diarrhea   Vancomycin Rash    ?red syndrome   Augmentin [Amoxicillin-Pot Clavulanate] Diarrhea    Extreme diarrhea, abdominal pain.    Past Medical History, Surgical history, Social history, and Family History were reviewed and updated.  Review of Systems: Review of  Systems  Constitutional: Negative.   HENT: Negative.    Eyes: Negative.   Respiratory: Negative.    Cardiovascular: Negative.   Gastrointestinal: Negative.   Genitourinary: Negative.   Musculoskeletal: Negative.   Skin:  Positive for rash.  Neurological: Negative.   Endo/Heme/Allergies: Negative.   Psychiatric/Behavioral: Negative.       Physical Exam:  weight is 180 lb (81.6 kg). Her oral temperature is 97.5 F (36.4 C) (abnormal). Her blood pressure is 125/65 and her pulse is 83. Her respiration is 17 and oxygen saturation is 100%.   Wt Readings from Last 3 Encounters:  11/22/21 180 lb (81.6 kg)  10/31/21 184 lb 0.6 oz (83.5 kg)  10/16/21 182 lb (82.6 kg)    Physical Exam Vitals reviewed.  HENT:     Head: Normocephalic and atraumatic.  Eyes:     Pupils: Pupils are equal, round, and reactive to light.  Cardiovascular:     Rate and Rhythm: Normal rate and regular rhythm.     Heart sounds: Normal heart sounds.  Pulmonary:     Effort: Pulmonary effort is normal.     Breath sounds: Normal breath sounds.  Abdominal:     General: Bowel sounds are normal.     Palpations: Abdomen is soft.  Musculoskeletal:        General: No tenderness or deformity. Normal range of motion.     Cervical back: Normal range of motion.     Comments: She has arthroscopic wounds in the left shoulder.  She has sutures.  There is no erythema.  She has markedly limited range of motion of the left shoulder.  Lymphadenopathy:     Cervical: No cervical adenopathy.  Skin:    General: Skin is warm and dry.     Findings: No erythema or rash.  Neurological:     Mental Status: She is alert and oriented to person, place, and time.  Psychiatric:        Behavior: Behavior normal.        Thought Content: Thought content normal.        Judgment: Judgment normal.     Lab Results  Component Value Date   WBC 2.8 (L) 11/22/2021   HGB 10.6 (L) 11/22/2021   HCT 34.6 (L) 11/22/2021   MCV 81.8 11/22/2021    PLT 195 11/22/2021   Lab Results  Component Value Date   FERRITIN 25 11/07/2021   IRON 36 11/07/2021   TIBC 363 11/07/2021   UIBC 327 11/07/2021   IRONPCTSAT 10 (L) 11/07/2021   Lab Results  Component Value Date   RETICCTPCT 1.8 11/07/2021   RBC 4.23 11/22/2021   No results found for: "KPAFRELGTCHN", "LAMBDASER", "KAPLAMBRATIO" No results found for: "IGGSERUM", "IGA", "IGMSERUM" No results found for: "TOTALPROTELP", "ALBUMINELP", "A1GS", "A2GS", "BETS", "BETA2SER", "GAMS", "MSPIKE", "SPEI"   Chemistry  Component Value Date/Time   NA 138 11/13/2021 1230   K 3.5 11/13/2021 1230   CL 102 11/13/2021 1230   CO2 26 11/13/2021 1230   BUN 10 11/13/2021 1230   CREATININE 0.47 11/13/2021 1230   CREATININE 0.47 11/07/2021 0840   CREATININE 0.56 03/23/2021 0000      Component Value Date/Time   CALCIUM 9.1 11/13/2021 1230   ALKPHOS 118 11/13/2021 1230   AST 16 11/13/2021 1230   AST 19 11/07/2021 0840   ALT 20 11/13/2021 1230   ALT 25 11/07/2021 0840   BILITOT 0.3 11/13/2021 1230   BILITOT 0.3 11/07/2021 0840       Impression and Plan: Ms. Jaquez is a very pleasant 56 yo caucasian female with metastatic colon cancer.  She has a liver met that is quite large.   We we will go ahead with the second cycle of chemotherapy with FOFOXIRI.  Hopefully, we will see that the CEA is coming down.  I want to try to give her 4 cycles and then repeat her scans do an MRI of the liver and hopefully think about getting her to surgery for resection of the liver met and the primary in the right colon.  Hopefully, Lomotil will be little more effective for the diarrhea.  We will plan to get her back in another 2 weeks.   [name], MD 6/28/20239:03 AM

## 2021-11-23 ENCOUNTER — Other Ambulatory Visit (HOSPITAL_BASED_OUTPATIENT_CLINIC_OR_DEPARTMENT_OTHER): Payer: Self-pay

## 2021-11-24 ENCOUNTER — Encounter: Payer: Self-pay | Admitting: Hematology & Oncology

## 2021-11-24 ENCOUNTER — Encounter: Payer: Self-pay | Admitting: Family

## 2021-11-24 ENCOUNTER — Inpatient Hospital Stay: Payer: 59

## 2021-11-24 VITALS — BP 110/60 | HR 58 | Temp 97.9°F | Resp 16

## 2021-11-24 DIAGNOSIS — Z5111 Encounter for antineoplastic chemotherapy: Secondary | ICD-10-CM | POA: Diagnosis not present

## 2021-11-24 DIAGNOSIS — C189 Malignant neoplasm of colon, unspecified: Secondary | ICD-10-CM

## 2021-11-24 DIAGNOSIS — D509 Iron deficiency anemia, unspecified: Secondary | ICD-10-CM

## 2021-11-24 DIAGNOSIS — D649 Anemia, unspecified: Secondary | ICD-10-CM

## 2021-11-24 MED ORDER — SODIUM CHLORIDE 0.9 % IV SOLN
125.0000 mg | Freq: Once | INTRAVENOUS | Status: AC
  Administered 2021-11-24: 125 mg via INTRAVENOUS
  Filled 2021-11-24: qty 10

## 2021-11-24 MED ORDER — HEPARIN SOD (PORK) LOCK FLUSH 100 UNIT/ML IV SOLN
500.0000 [IU] | Freq: Once | INTRAVENOUS | Status: AC | PRN
  Administered 2021-11-24: 500 [IU]

## 2021-11-24 MED ORDER — SODIUM CHLORIDE 0.9% FLUSH
10.0000 mL | INTRAVENOUS | Status: DC | PRN
  Administered 2021-11-24: 10 mL

## 2021-11-24 MED ORDER — SODIUM CHLORIDE 0.9 % IV SOLN: Freq: Once | INTRAVENOUS | Status: AC

## 2021-11-24 NOTE — Patient Instructions (Signed)
Sodium Ferric Gluconate Complex Injection What is this medication? SODIUM FERRIC GLUCONATE COMPLEX (SOE dee um FER ik GLOO koe nate KOM pleks) treats low levels of iron (iron deficiency anemia) in people with kidney disease. Iron is a mineral that plays an important role in making red blood cells, which carry oxygen from your lungs to the rest of your body. This medicine may be used for other purposes; ask your health care provider or pharmacist if you have questions. COMMON BRAND NAME(S): Ferrlecit, Nulecit What should I tell my care team before I take this medication? They need to know if you have any of the following conditions: Anemia that is not from iron deficiency High levels of iron in the blood An unusual or allergic reaction to iron, other medications, foods, dyes, or preservatives Pregnant or are trying to become pregnant Breast-feeding How should I use this medication? This medication is injected into a vein. It is given by your care team in a hospital or clinic setting. Talk to your care team about the use of this medication in children. While it may be prescribed for children as young as 6 years for selected conditions, precautions do apply. Overdosage: If you think you have taken too much of this medicine contact a poison control center or emergency room at once. NOTE: This medicine is only for you. Do not share this medicine with others. What if I miss a dose? It is important not to miss your dose. Call your care team if you are unable to keep an appointment. What may interact with this medication? Do not take this medication with any of the following: Deferasirox Deferoxamine Dimercaprol This medication may also interact with the following: Other iron products This list may not describe all possible interactions. Give your health care provider a list of all the medicines, herbs, non-prescription drugs, or dietary supplements you use. Also tell them if you smoke, drink  alcohol, or use illegal drugs. Some items may interact with your medicine. What should I watch for while using this medication? Your condition will be monitored carefully while you are receiving this medication. Visit your care team for regular checks on your progress. You may need blood work while you are taking this medication. What side effects may I notice from receiving this medication? Side effects that you should report to your care team as soon as possible: Allergic reactions--skin rash, itching, hives, swelling of the face, lips, tongue, or throat Low blood pressure--dizziness, feeling faint or lightheaded, blurry vision Shortness of breath Side effects that usually do not require medical attention (report to your care team if they continue or are bothersome): Flushing Headache Joint pain Muscle pain Nausea Pain, redness, or irritation at injection site This list may not describe all possible side effects. Call your doctor for medical advice about side effects. You may report side effects to FDA at 1-800-FDA-1088. Where should I keep my medication? This medication is given in a hospital or clinic and will not be stored at home. NOTE: This sheet is a summary. It may not cover all possible information. If you have questions about this medicine, talk to your doctor, pharmacist, or health care provider.  2023 Elsevier/Gold Standard (2020-10-07 00:00:00)  

## 2021-11-24 NOTE — Progress Notes (Signed)
Oncology Nurse Navigator Documentation     11/22/2021    9:30 AM  Oncology Nurse Navigator Flowsheets  Navigator Follow Up Date: 12/06/2021  Navigator Follow Up Reason: Follow-up Appointment;Chemotherapy  Navigator Location [facility]  Navigator Encounter Type Treatment;Appt/Treatment Plan Review  Patient Visit Type MedOnc  Treatment Phase Active Tx  Barriers/Navigation Needs Coordination of Care;Education  Interventions Psycho-Social Support  Acuity Level 2-Minimal Needs (1-2 Barriers Identified)  Support Groups/Services Friends and Family  Time Spent with Patient 15

## 2021-11-24 NOTE — Progress Notes (Signed)
Patient refused to wait 30 minutes post iron infusion. Released stable and ASX.

## 2021-12-06 ENCOUNTER — Inpatient Hospital Stay: Payer: Medicaid Other

## 2021-12-06 ENCOUNTER — Inpatient Hospital Stay: Payer: Medicaid Other | Admitting: Licensed Clinical Social Worker

## 2021-12-06 ENCOUNTER — Inpatient Hospital Stay: Payer: Medicaid Other | Attending: Hematology & Oncology

## 2021-12-06 ENCOUNTER — Encounter: Payer: Self-pay | Admitting: Family

## 2021-12-06 ENCOUNTER — Encounter: Payer: Self-pay | Admitting: *Deleted

## 2021-12-06 ENCOUNTER — Inpatient Hospital Stay (HOSPITAL_BASED_OUTPATIENT_CLINIC_OR_DEPARTMENT_OTHER): Payer: Medicaid Other | Admitting: Family

## 2021-12-06 VITALS — BP 118/70 | HR 71 | Temp 97.8°F | Resp 17 | Wt 179.0 lb

## 2021-12-06 DIAGNOSIS — C787 Secondary malignant neoplasm of liver and intrahepatic bile duct: Secondary | ICD-10-CM | POA: Diagnosis not present

## 2021-12-06 DIAGNOSIS — K449 Diaphragmatic hernia without obstruction or gangrene: Secondary | ICD-10-CM

## 2021-12-06 DIAGNOSIS — D649 Anemia, unspecified: Secondary | ICD-10-CM

## 2021-12-06 DIAGNOSIS — D219 Benign neoplasm of connective and other soft tissue, unspecified: Secondary | ICD-10-CM

## 2021-12-06 DIAGNOSIS — N9489 Other specified conditions associated with female genital organs and menstrual cycle: Secondary | ICD-10-CM

## 2021-12-06 DIAGNOSIS — C189 Malignant neoplasm of colon, unspecified: Secondary | ICD-10-CM

## 2021-12-06 DIAGNOSIS — R3129 Other microscopic hematuria: Secondary | ICD-10-CM

## 2021-12-06 DIAGNOSIS — R053 Chronic cough: Secondary | ICD-10-CM

## 2021-12-06 DIAGNOSIS — Z7189 Other specified counseling: Secondary | ICD-10-CM

## 2021-12-06 DIAGNOSIS — C182 Malignant neoplasm of ascending colon: Secondary | ICD-10-CM | POA: Insufficient documentation

## 2021-12-06 DIAGNOSIS — R1031 Right lower quadrant pain: Secondary | ICD-10-CM

## 2021-12-06 DIAGNOSIS — U099 Post covid-19 condition, unspecified: Secondary | ICD-10-CM

## 2021-12-06 DIAGNOSIS — N920 Excessive and frequent menstruation with regular cycle: Secondary | ICD-10-CM

## 2021-12-06 DIAGNOSIS — N939 Abnormal uterine and vaginal bleeding, unspecified: Secondary | ICD-10-CM

## 2021-12-06 DIAGNOSIS — G4700 Insomnia, unspecified: Secondary | ICD-10-CM

## 2021-12-06 DIAGNOSIS — Z452 Encounter for adjustment and management of vascular access device: Secondary | ICD-10-CM | POA: Diagnosis not present

## 2021-12-06 DIAGNOSIS — D509 Iron deficiency anemia, unspecified: Secondary | ICD-10-CM | POA: Diagnosis not present

## 2021-12-06 DIAGNOSIS — E559 Vitamin D deficiency, unspecified: Secondary | ICD-10-CM

## 2021-12-06 DIAGNOSIS — R21 Rash and other nonspecific skin eruption: Secondary | ICD-10-CM | POA: Insufficient documentation

## 2021-12-06 DIAGNOSIS — N950 Postmenopausal bleeding: Secondary | ICD-10-CM

## 2021-12-06 DIAGNOSIS — R9389 Abnormal findings on diagnostic imaging of other specified body structures: Secondary | ICD-10-CM

## 2021-12-06 DIAGNOSIS — N951 Menopausal and female climacteric states: Secondary | ICD-10-CM

## 2021-12-06 DIAGNOSIS — Z5111 Encounter for antineoplastic chemotherapy: Secondary | ICD-10-CM | POA: Diagnosis present

## 2021-12-06 DIAGNOSIS — J302 Other seasonal allergic rhinitis: Secondary | ICD-10-CM

## 2021-12-06 DIAGNOSIS — R197 Diarrhea, unspecified: Secondary | ICD-10-CM | POA: Diagnosis not present

## 2021-12-06 DIAGNOSIS — Z801 Family history of malignant neoplasm of trachea, bronchus and lung: Secondary | ICD-10-CM

## 2021-12-06 DIAGNOSIS — Z800 Family history of malignant neoplasm of digestive organs: Secondary | ICD-10-CM

## 2021-12-06 DIAGNOSIS — Z86018 Personal history of other benign neoplasm: Secondary | ICD-10-CM

## 2021-12-06 DIAGNOSIS — R0602 Shortness of breath: Secondary | ICD-10-CM

## 2021-12-06 DIAGNOSIS — L280 Lichen simplex chronicus: Secondary | ICD-10-CM

## 2021-12-06 LAB — CMP (CANCER CENTER ONLY)
ALT: 11 U/L (ref 0–44)
AST: 14 U/L — ABNORMAL LOW (ref 15–41)
Albumin: 4.1 g/dL (ref 3.5–5.0)
Alkaline Phosphatase: 102 U/L (ref 38–126)
Anion gap: 7 (ref 5–15)
BUN: 8 mg/dL (ref 6–20)
CO2: 27 mmol/L (ref 22–32)
Calcium: 9.3 mg/dL (ref 8.9–10.3)
Chloride: 106 mmol/L (ref 98–111)
Creatinine: 0.54 mg/dL (ref 0.44–1.00)
GFR, Estimated: >60 mL/min (ref >60)
Glucose, Bld: 105 mg/dL — ABNORMAL HIGH (ref 70–99)
Potassium: 3.7 mmol/L (ref 3.5–5.1)
Sodium: 140 mmol/L (ref 135–145)
Total Bilirubin: 0.2 mg/dL — ABNORMAL LOW (ref 0.3–1.2)
Total Protein: 6.3 g/dL — ABNORMAL LOW (ref 6.5–8.1)

## 2021-12-06 LAB — CBC WITH DIFFERENTIAL (CANCER CENTER ONLY)
Abs Immature Granulocytes: 0 10*3/uL (ref 0.00–0.07)
Basophils Absolute: 0 10*3/uL (ref 0.0–0.1)
Basophils Relative: 0 %
Eosinophils Absolute: 0.1 10*3/uL (ref 0.0–0.5)
Eosinophils Relative: 4 %
HCT: 35.8 % — ABNORMAL LOW (ref 36.0–46.0)
Hemoglobin: 10.9 g/dL — ABNORMAL LOW (ref 12.0–15.0)
Immature Granulocytes: 0 %
Lymphocytes Relative: 38 %
Lymphs Abs: 1.2 10*3/uL (ref 0.7–4.0)
MCH: 25.4 pg — ABNORMAL LOW (ref 26.0–34.0)
MCHC: 30.4 g/dL (ref 30.0–36.0)
MCV: 83.4 fL (ref 80.0–100.0)
Monocytes Absolute: 0.5 10*3/uL (ref 0.1–1.0)
Monocytes Relative: 15 %
Neutro Abs: 1.4 10*3/uL — ABNORMAL LOW (ref 1.7–7.7)
Neutrophils Relative %: 43 %
Platelet Count: 150 10*3/uL (ref 150–400)
RBC: 4.29 MIL/uL (ref 3.87–5.11)
RDW: 17.4 % — ABNORMAL HIGH (ref 11.5–15.5)
WBC Count: 3.2 10*3/uL — ABNORMAL LOW (ref 4.0–10.5)
nRBC: 0 % (ref 0.0–0.2)

## 2021-12-06 LAB — FERRITIN: Ferritin: 130 ng/mL (ref 11–307)

## 2021-12-06 LAB — CEA (IN HOUSE-CHCC): CEA (CHCC-In House): 6.69 ng/mL — ABNORMAL HIGH (ref 0.00–5.00)

## 2021-12-06 MED ORDER — OXALIPLATIN CHEMO INJECTION 100 MG/20ML
76.5000 mg/m2 | Freq: Once | INTRAVENOUS | Status: AC
  Administered 2021-12-06: 145 mg via INTRAVENOUS
  Filled 2021-12-06: qty 20

## 2021-12-06 MED ORDER — SODIUM CHLORIDE 0.9 % IV SOLN
2880.0000 mg/m2 | INTRAVENOUS | Status: DC
  Administered 2021-12-06: 5450 mg via INTRAVENOUS
  Filled 2021-12-06: qty 109

## 2021-12-06 MED ORDER — LEUCOVORIN CALCIUM INJECTION 350 MG
400.0000 mg/m2 | Freq: Once | INTRAVENOUS | Status: AC
  Administered 2021-12-06: 760 mg via INTRAVENOUS
  Filled 2021-12-06: qty 38

## 2021-12-06 MED ORDER — SODIUM CHLORIDE 0.9 % IV SOLN
10.0000 mg | Freq: Once | INTRAVENOUS | Status: AC
  Administered 2021-12-06: 10 mg via INTRAVENOUS
  Filled 2021-12-06: qty 10

## 2021-12-06 MED ORDER — PALONOSETRON HCL INJECTION 0.25 MG/5ML
0.2500 mg | Freq: Once | INTRAVENOUS | Status: AC
  Administered 2021-12-06: 0.25 mg via INTRAVENOUS
  Filled 2021-12-06: qty 5

## 2021-12-06 MED ORDER — ATROPINE SULFATE 1 MG/ML IV SOLN
0.5000 mg | Freq: Once | INTRAVENOUS | Status: AC | PRN
  Administered 2021-12-06: 0.5 mg via INTRAVENOUS
  Filled 2021-12-06: qty 1

## 2021-12-06 MED ORDER — DEXTROSE 5 % IV SOLN: Freq: Once | INTRAVENOUS | Status: AC

## 2021-12-06 MED ORDER — SODIUM CHLORIDE 0.9 % IV SOLN
150.0000 mg | Freq: Once | INTRAVENOUS | Status: AC
  Administered 2021-12-06: 150 mg via INTRAVENOUS
  Filled 2021-12-06: qty 150

## 2021-12-06 MED ORDER — SODIUM CHLORIDE 0.9 % IV SOLN
133.6500 mg/m2 | Freq: Once | INTRAVENOUS | Status: AC
  Administered 2021-12-06: 260 mg via INTRAVENOUS
  Filled 2021-12-06: qty 5

## 2021-12-06 NOTE — Patient Instructions (Signed)

## 2021-12-06 NOTE — Progress Notes (Signed)
Patient is here to begin cycle 3. She is doing well. Scans will be needed after cycle 4.  Oncology Nurse Navigator Documentation     12/06/2021    2:00 PM  Oncology Nurse Navigator Flowsheets  Navigator Follow Up Date: 12/20/2021  Navigator Follow Up Reason: Follow-up Appointment;Chemotherapy  Navigator Location [facility]  Navigator Encounter Type Treatment;Appt/Treatment Plan Review  Patient Visit Type MedOnc  Treatment Phase Active Tx  Barriers/Navigation Needs Coordination of Care;Education  Interventions Psycho-Social Support  Acuity Level 2-Minimal Needs (1-2 Barriers Identified)  Support Groups/Services Friends and Family  Time Spent with Patient 15

## 2021-12-06 NOTE — Progress Notes (Signed)
Per Lottie Dawson, NP ok to treat with ANC 1.4

## 2021-12-06 NOTE — Progress Notes (Signed)
Hematology and Oncology Follow Up Visit  Rebecca Bullock [id number] May 20, 1966 56 y.o. 12/06/2021   Principle Diagnosis:  Metastatic colorectal cancer-liver metastasis- BRAF (+)   Current Therapy:        Status post cycle 1 of chemotherapy with FOLFOXIRI/Avastin Encorafenib/Vectibix -- started on 06/19/2021, s/p cycle #6 -- d/c on 10/31/2021 FOLFOXIRI -- s/p cycle  -- start on 11/08/2021, s/p cycle 2   Interim History:  Rebecca Bullock is here today for follow-up. She is doing well and notes that her rash on the face and chest are better.  CEA last visit 3.95.  No fever, chills, n/v, cough, dizziness, SOB, chest pain, palpitations, abdominal pain or changes in bowel or bladder habits.  She has been able to take lomotil twice a day and stop the diarrhea.  No bleeding, bruising or petechiae.  She still has limited ROM in the left arm/shoulder since arthroscopy.  No swelling, numbness or tingling in her extremities.  No falls or syncope to report.  Appetite and hydration have been good. She admits that she needs to hydrate more throughout the day. Her weight is stable at 179 lbs.   ECOG Performance Status: 1 - Symptomatic but completely ambulatory  Medications:  Allergies as of 12/06/2021       Reactions   Irinotecan Other (See Comments)   Flushing, tingling, visual disturbance   Metronidazole Diarrhea   Vancomycin Rash   ?red syndrome   Augmentin [amoxicillin-pot Clavulanate] Diarrhea   Extreme diarrhea, abdominal pain.        Medication List        Accurate as of December 06, 2021  1:02 PM. If you have any questions, ask your nurse or doctor.          Acetaminophen 325 MG Caps Take 650 mg by mouth every 8 (eight) hours as needed for moderate pain or headache.   betamethasone valerate 0.1 % cream Commonly known as: VALISONE Apply 1 application topically See admin instructions. Apply 1 application topically every other day as needed for psoriasis flare   dexamethasone 4 MG  tablet Commonly known as: DECADRON Take 2 tablets (8 mg total) by mouth daily. Start the day after chemotherapy for 3 days. Take with food.   diphenoxylate-atropine 2.5-0.025 MG tablet Commonly known as: LOMOTIL Take 2 tablets by mouth 4 (four) times daily as needed for diarrhea or loose stools.   encorafenib 75 MG capsule Commonly known as: BRAFTOVI Take 4 capsules (300 mg total) by mouth daily. What changed: how much to take   estradiol 0.1 MG/GM vaginal cream Commonly known as: ESTRACE Place 1 Applicatorful vaginally daily as needed (dryness / irritation).   fluticasone 50 MCG/ACT nasal spray Commonly known as: FLONASE Place 2 sprays into both nostrils daily.   HYDROcodone-acetaminophen 5-325 MG tablet Commonly known as: NORCO/VICODIN Take 1 tablet by mouth every 4 (four) hours as needed for moderate pain.   hydrocortisone 1 % lotion Apply 1 application topically 2 (two) times daily as needed for itching.   LORazepam 0.5 MG tablet Commonly known as: ATIVAN Take 1 tablet (0.5 mg total) by mouth every 6 (six) hours as needed for anxiety.   omeprazole 20 MG tablet Commonly known as: PRILOSEC OTC Take 20 mg by mouth daily as needed (acid reflux).   ondansetron 8 MG tablet Commonly known as: Zofran Take 1 tablet (8 mg total) by mouth 2 (two) times daily as needed. Start on day 3 after chemotherapy.   predniSONE 5 MG tablet Commonly known as: DELTASONE  Take 6 tablets by mouth daily on days 1 - 4, then take 4 tablets daily on days 5 - 8, then take 2 tablets daily on days 9 - 12.   prochlorperazine 10 MG tablet Commonly known as: COMPAZINE Take 1 tablet (10 mg total) by mouth every 6 (six) hours as needed (Nausea or vomiting).        Allergies:  Allergies  Allergen Reactions   Irinotecan Other (See Comments)    Flushing, tingling, visual disturbance   Metronidazole Diarrhea   Vancomycin Rash    ?red syndrome   Augmentin [Amoxicillin-Pot Clavulanate] Diarrhea     Extreme diarrhea, abdominal pain.    Past Medical History, Surgical history, Social history, and Family History were reviewed and updated.  Review of Systems: All other 10 point review of systems is negative.   Physical Exam:  weight is 179 lb (81.2 kg). Her oral temperature is 97.8 F (36.6 C). Her blood pressure is 118/70 and her pulse is 71. Her respiration is 17 and oxygen saturation is 100%.   Wt Readings from Last 3 Encounters:  12/06/21 179 lb (81.2 kg)  11/22/21 180 lb (81.6 kg)  10/31/21 184 lb 0.6 oz (83.5 kg)    Ocular: Sclerae unicteric, pupils equal, round and reactive to light Ear-nose-throat: Oropharynx clear, dentition fair Lymphatic: No cervical or supraclavicular adenopathy Lungs no rales or rhonchi, good excursion bilaterally Heart regular rate and rhythm, no murmur appreciated Abd soft, nontender, positive bowel sounds MSK no focal spinal tenderness, no joint edema Neuro: non-focal, well-oriented, appropriate affect Breasts: Deferred   Lab Results  Component Value Date   WBC 3.2 (L) 12/06/2021   HGB 10.9 (L) 12/06/2021   HCT 35.8 (L) 12/06/2021   MCV 83.4 12/06/2021   PLT 150 12/06/2021   Lab Results  Component Value Date   FERRITIN 105 11/22/2021   IRON 29 11/22/2021   TIBC 307 11/22/2021   UIBC 278 11/22/2021   IRONPCTSAT 10 (L) 11/22/2021   Lab Results  Component Value Date   RETICCTPCT 1.8 11/07/2021   RBC 4.29 12/06/2021   No results found for: "KPAFRELGTCHN", "LAMBDASER", "KAPLAMBRATIO" No results found for: "IGGSERUM", "IGA", "IGMSERUM" No results found for: "TOTALPROTELP", "ALBUMINELP", "A1GS", "A2GS", "BETS", "BETA2SER", "GAMS", "MSPIKE", "SPEI"   Chemistry      Component Value Date/Time   NA 140 12/06/2021 1120   K 3.7 12/06/2021 1120   CL 106 12/06/2021 1120   CO2 27 12/06/2021 1120   BUN 8 12/06/2021 1120   CREATININE 0.54 12/06/2021 1120   CREATININE 0.56 03/23/2021 0000      Component Value Date/Time   CALCIUM 9.3  12/06/2021 1120   ALKPHOS 102 12/06/2021 1120   AST 14 (L) 12/06/2021 1120   ALT 11 12/06/2021 1120   BILITOT 0.2 (L) 12/06/2021 1120       Impression and Plan: Rebecca Bullock is a very pleasant 56 yo caucasian female with metastatic colon cancer.  CEA pending.  We will proceed with cycle 3 of FOFOXIRI.  We will plan to repeat her PET scan and MRI after cycle 4.  Follow-up in 2 weeks.   Lottie Dawson, NP 7/12/20231:02 PM

## 2021-12-06 NOTE — Progress Notes (Signed)
[facility]  Initial Assessment   Rebecca Bullock is a 56 y.o. year old female presenting alone. Clinical Social Work was referred by nurse navigator for assessment of psychosocial needs.   SDOH (Social Determinants of Health) assessments performed: Yes   SDOH Screenings   Alcohol Screen: Not on file  Depression (PHQ2-9): Low Risk  (10/16/2021)   Depression (PHQ2-9)    PHQ-2 Score: 0  Financial Resource Strain: Medium Risk (05/18/2021)   Overall Financial Resource Strain (CARDIA)    Difficulty of Paying Living Expenses: Somewhat hard  Food Insecurity: No Food Insecurity (05/18/2021)   Hunger Vital Sign    Worried About Running Out of Food in the Last Year: Never true    Ran Out of Food in the Last Year: Never true  Housing: Medium Risk (05/18/2021)   Housing    Last Housing Risk Score: 1  Physical Activity: Not on file  Social Connections: Not on file  Stress: Not on file  Tobacco Use: Low Risk  (12/06/2021)   Patient History    Smoking Tobacco Use: Never    Smokeless Tobacco Use: Never    Passive Exposure: Not on file  Transportation Needs: No Transportation Needs (05/18/2021)   PRAPARE - Transportation    Lack of Transportation (Medical): No    Lack of Transportation (Non-Medical): No     Distress Screen completed: No     No data to display            Family/Social Information:  Housing Arrangement: patient lives alone. Family members/support persons in your life? Family, Friends, and [employer].  Her son is attending [employer] and visits her often.  She also has an adult daughter who lives in [place] and another daughter who lives in [place].  The daughter in [place] is expecting her first child in October.  Transportation concerns: no  Employment: Working part time. 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 Services Currently in place:  She has contacted [employer] for assistance.  She is currently receiving assistance from the [employer].  Coping/ Adjustment to diagnosis: Patient understands treatment plan and what happens next? yes Concerns about diagnosis and/or treatment: Losing my job and/or losing income Patient reported stressors: Work/ school Hopes and/or priorities: Patient reports her priorities include her friends and family.  Patient also has a cat that brings her joy. Patient enjoys time with family/ friends Current coping skills/ strengths: Average or above average intelligence , Capable of independent living , Communication skills , General fund of knowledge , Motivation for treatment/growth , Religious Affiliation , and Supportive family/friends     SUMMARY: Current SDOH Barriers:  Financial constraints related to losing her full-time employment in June.   She has filed for unemployment.  Clinical Social Work Clinical Goal(s):  Explore community resource options for unmet needs related to:  Financial Strain   Interventions: Discussed common feeling and emotions when being diagnosed with cancer, and the importance of support during treatment Informed patient of the support team roles and support services at [facility] Provided [place] contact information and encouraged patient to call with any questions or concerns Referred patient to [facility] for Disability.  CSW provided two websites for grants for cancer patients.   Follow Up Plan: CSW will follow-up with patient by phone  Patient  verbalizes understanding of plan: Yes    [name] Rebecca Pfannenstiel, LCSW

## 2021-12-07 ENCOUNTER — Telehealth: Payer: Self-pay

## 2021-12-07 DIAGNOSIS — C189 Malignant neoplasm of colon, unspecified: Secondary | ICD-10-CM

## 2021-12-07 LAB — IRON AND IRON BINDING CAPACITY (CC-WL,HP ONLY)
Iron: 49 ug/dL (ref 28–170)
Saturation Ratios: 15 % (ref 10.4–31.8)
TIBC: 322 ug/dL (ref 250–450)
UIBC: 273 ug/dL (ref 148–442)

## 2021-12-07 NOTE — Telephone Encounter (Signed)
Called and informed patient of CEA results and that Dr.Ennever would like to do an MRI next week. Patient confirmed and understands someone will be calling her to get that scheduled.

## 2021-12-07 NOTE — Telephone Encounter (Signed)
-----   Message from [name], MD sent at 12/06/2021  8:55 PM EDT ----- Call - the CEA tumor level is a little higher.  He need to do an MRI of the liver and try to get her to surgery.  Please order this for 1 week.  Thanks!!!  [name]

## 2021-12-08 ENCOUNTER — Inpatient Hospital Stay: Payer: Medicaid Other

## 2021-12-08 ENCOUNTER — Telehealth: Payer: Self-pay

## 2021-12-08 VITALS — BP 107/61 | HR 78 | Temp 98.4°F | Resp 18

## 2021-12-08 DIAGNOSIS — Z5111 Encounter for antineoplastic chemotherapy: Secondary | ICD-10-CM | POA: Diagnosis not present

## 2021-12-08 DIAGNOSIS — C189 Malignant neoplasm of colon, unspecified: Secondary | ICD-10-CM

## 2021-12-08 MED ORDER — SODIUM CHLORIDE 0.9% FLUSH
10.0000 mL | INTRAVENOUS | Status: DC | PRN
  Administered 2021-12-08: 10 mL

## 2021-12-08 MED ORDER — SODIUM CHLORIDE 0.9 % IV SOLN: Freq: Once | INTRAVENOUS | Status: AC

## 2021-12-08 MED ORDER — HEPARIN SOD (PORK) LOCK FLUSH 100 UNIT/ML IV SOLN
500.0000 [IU] | Freq: Once | INTRAVENOUS | Status: AC | PRN
  Administered 2021-12-08: 500 [IU]

## 2021-12-08 NOTE — Patient Instructions (Signed)

## 2021-12-08 NOTE — Telephone Encounter (Signed)
Per Dr. Antonieta Pert request patient called and made aware that her iron level is stable and no IV iron is needed. Pt verbalized understanding and had no further questions. Pt aware of her appointment this afternoon.

## 2021-12-08 NOTE — Telephone Encounter (Signed)
-----   Message from [name], MD sent at 12/07/2021  8:38 PM EDT ----- Call - the iron is ok!!!  [name]

## 2021-12-13 ENCOUNTER — Other Ambulatory Visit: Payer: Self-pay | Admitting: *Deleted

## 2021-12-15 ENCOUNTER — Ambulatory Visit (HOSPITAL_COMMUNITY)
Admission: RE | Admit: 2021-12-15 | Discharge: 2021-12-15 | Disposition: A | Discharge status: Home / Self Care | Payer: Medicaid Other | Source: Ambulatory Visit | Attending: Family | Admitting: Family

## 2021-12-15 DIAGNOSIS — C189 Malignant neoplasm of colon, unspecified: Secondary | ICD-10-CM | POA: Insufficient documentation

## 2021-12-15 DIAGNOSIS — C787 Secondary malignant neoplasm of liver and intrahepatic bile duct: Secondary | ICD-10-CM | POA: Insufficient documentation

## 2021-12-15 MED ORDER — GADOBUTROL 1 MMOL/ML IV SOLN
8.0000 mL | Freq: Once | INTRAVENOUS | Status: AC | PRN
  Administered 2021-12-15: 8 mL via INTRAVENOUS

## 2021-12-18 ENCOUNTER — Telehealth: Payer: Self-pay

## 2021-12-18 ENCOUNTER — Other Ambulatory Visit: Payer: Self-pay

## 2021-12-18 NOTE — Telephone Encounter (Signed)
-----   Message from [name], MD sent at 12/18/2021 11:23 AM EDT ----- Call and let her know that that the cancer is shrinking.  Now, I will see if a surgeon can see her and try to take this out.  I will speak with Dr. Barry Dienes.  She is very very good at doing these kind of surgeries.  Thanks.  Rebecca Bullock

## 2021-12-18 NOTE — Telephone Encounter (Signed)
Called and informed patient of results, patient verbalized understanding and states her insurance is running out by the end of the month (12/25/21). Pt would like to know if this can be done before then. Per Dr [name] it will probably take 2-3 weeks at the earliest. Dr [facility] office should have a payment plan she could look into.  Pt advised and will speak with the office regarding this when they contact her.

## 2021-12-20 ENCOUNTER — Encounter: Payer: Self-pay | Admitting: Family

## 2021-12-20 ENCOUNTER — Inpatient Hospital Stay: Payer: Medicaid Other

## 2021-12-20 ENCOUNTER — Inpatient Hospital Stay: Payer: Medicaid Other | Admitting: Licensed Clinical Social Worker

## 2021-12-20 ENCOUNTER — Inpatient Hospital Stay (HOSPITAL_BASED_OUTPATIENT_CLINIC_OR_DEPARTMENT_OTHER): Payer: Medicaid Other | Admitting: Family

## 2021-12-20 ENCOUNTER — Telehealth: Payer: Self-pay | Admitting: *Deleted

## 2021-12-20 ENCOUNTER — Encounter: Payer: Self-pay | Admitting: *Deleted

## 2021-12-20 VITALS — BP 133/69 | HR 69 | Temp 97.9°F | Resp 17 | Wt 179.0 lb

## 2021-12-20 DIAGNOSIS — C189 Malignant neoplasm of colon, unspecified: Secondary | ICD-10-CM

## 2021-12-20 DIAGNOSIS — C787 Secondary malignant neoplasm of liver and intrahepatic bile duct: Secondary | ICD-10-CM

## 2021-12-20 DIAGNOSIS — Z5111 Encounter for antineoplastic chemotherapy: Secondary | ICD-10-CM | POA: Diagnosis not present

## 2021-12-20 DIAGNOSIS — D509 Iron deficiency anemia, unspecified: Secondary | ICD-10-CM

## 2021-12-20 LAB — CBC WITH DIFFERENTIAL (CANCER CENTER ONLY)
Abs Immature Granulocytes: 0.01 10*3/uL (ref 0.00–0.07)
Basophils Absolute: 0 10*3/uL (ref 0.0–0.1)
Basophils Relative: 0 %
Eosinophils Absolute: 0.1 10*3/uL (ref 0.0–0.5)
Eosinophils Relative: 2 %
HCT: 36.3 % (ref 36.0–46.0)
Hemoglobin: 11.4 g/dL — ABNORMAL LOW (ref 12.0–15.0)
Immature Granulocytes: 0 %
Lymphocytes Relative: 38 %
Lymphs Abs: 1 10*3/uL (ref 0.7–4.0)
MCH: 26.2 pg (ref 26.0–34.0)
MCHC: 31.4 g/dL (ref 30.0–36.0)
MCV: 83.4 fL (ref 80.0–100.0)
Monocytes Absolute: 0.4 10*3/uL (ref 0.1–1.0)
Monocytes Relative: 16 %
Neutro Abs: 1.1 10*3/uL — ABNORMAL LOW (ref 1.7–7.7)
Neutrophils Relative %: 44 %
Platelet Count: 102 10*3/uL — ABNORMAL LOW (ref 150–400)
RBC: 4.35 MIL/uL (ref 3.87–5.11)
RDW: 19 % — ABNORMAL HIGH (ref 11.5–15.5)
WBC Count: 2.6 10*3/uL — ABNORMAL LOW (ref 4.0–10.5)
nRBC: 0 % (ref 0.0–0.2)

## 2021-12-20 LAB — CMP (CANCER CENTER ONLY)
ALT: 15 U/L (ref 0–44)
AST: 19 U/L (ref 15–41)
Albumin: 4.2 g/dL (ref 3.5–5.0)
Alkaline Phosphatase: 103 U/L (ref 38–126)
Anion gap: 8 (ref 5–15)
BUN: 5 mg/dL — ABNORMAL LOW (ref 6–20)
CO2: 27 mmol/L (ref 22–32)
Calcium: 9.3 mg/dL (ref 8.9–10.3)
Chloride: 104 mmol/L (ref 98–111)
Creatinine: 0.57 mg/dL (ref 0.44–1.00)
GFR, Estimated: >60 mL/min (ref >60)
Glucose, Bld: 124 mg/dL — ABNORMAL HIGH (ref 70–99)
Potassium: 3.6 mmol/L (ref 3.5–5.1)
Sodium: 139 mmol/L (ref 135–145)
Total Bilirubin: 0.3 mg/dL (ref 0.3–1.2)
Total Protein: 6.8 g/dL (ref 6.5–8.1)

## 2021-12-20 LAB — FERRITIN: Ferritin: 163 ng/mL (ref 11–307)

## 2021-12-20 LAB — IRON AND IRON BINDING CAPACITY (CC-WL,HP ONLY)
Iron: 59 ug/dL (ref 28–170)
Saturation Ratios: 16 % (ref 10.4–31.8)
TIBC: 363 ug/dL (ref 250–450)
UIBC: 304 ug/dL (ref 148–442)

## 2021-12-20 LAB — CEA (IN HOUSE-CHCC): CEA (CHCC-In House): 8.16 ng/mL — ABNORMAL HIGH (ref 0.00–5.00)

## 2021-12-20 MED ORDER — ATROPINE SULFATE 1 MG/ML IV SOLN
0.5000 mg | Freq: Once | INTRAVENOUS | Status: AC | PRN
  Administered 2021-12-20: 0.5 mg via INTRAVENOUS
  Filled 2021-12-20: qty 1

## 2021-12-20 MED ORDER — SODIUM CHLORIDE 0.9 % IV SOLN
2880.0000 mg/m2 | INTRAVENOUS | Status: DC
  Administered 2021-12-20: 5450 mg via INTRAVENOUS
  Filled 2021-12-20: qty 100

## 2021-12-20 MED ORDER — SODIUM CHLORIDE 0.9 % IV SOLN
10.0000 mg | Freq: Once | INTRAVENOUS | Status: AC
  Administered 2021-12-20: 10 mg via INTRAVENOUS
  Filled 2021-12-20: qty 10

## 2021-12-20 MED ORDER — SODIUM CHLORIDE 0.9 % IV SOLN: INTRAVENOUS | Status: DC

## 2021-12-20 MED ORDER — PALONOSETRON HCL INJECTION 0.25 MG/5ML
0.2500 mg | Freq: Once | INTRAVENOUS | Status: AC
  Administered 2021-12-20: 0.25 mg via INTRAVENOUS
  Filled 2021-12-20: qty 5

## 2021-12-20 MED ORDER — SODIUM CHLORIDE 0.9 % IV SOLN
133.6500 mg/m2 | Freq: Once | INTRAVENOUS | Status: AC
  Administered 2021-12-20: 260 mg via INTRAVENOUS
  Filled 2021-12-20: qty 5

## 2021-12-20 MED ORDER — SODIUM CHLORIDE 0.9 % IV SOLN
150.0000 mg | Freq: Once | INTRAVENOUS | Status: AC
  Administered 2021-12-20: 150 mg via INTRAVENOUS
  Filled 2021-12-20: qty 150

## 2021-12-20 MED ORDER — SODIUM CHLORIDE 0.9 % IV SOLN
400.0000 mg/m2 | Freq: Once | INTRAVENOUS | Status: AC
  Administered 2021-12-20: 760 mg via INTRAVENOUS
  Filled 2021-12-20: qty 38

## 2021-12-20 NOTE — Progress Notes (Signed)
Reviewed pt labs with Dr. [name] and ok to proceed with treatment with ANC 1.1

## 2021-12-20 NOTE — Patient Instructions (Signed)

## 2021-12-20 NOTE — Telephone Encounter (Signed)
Per 12/20/21 los - called and gave upcoming appointments - confirmed

## 2021-12-20 NOTE — Progress Notes (Signed)
[facility] CSW Progress Note  Holiday representative met with patient to assess new psychosocial issues.  Valine was receiving an infusion during the visit and denied any pain or discomfort.    Patient stated she was able to make contact with Cecille Rubin at the [facility] regarding disability.  Her disability application is currently pending.  She reported not going to resource locations that are far from her home due to transportation costs.  Discussed the [facility], which she accepted.  She expressed no other needs at this time.   [name] Deundre Thong, LCSW

## 2021-12-20 NOTE — Progress Notes (Unsigned)
Patient presents for treatment. She is awaiting an appointment with general surgery to discuss liver resection. Dr Barry Dienes is out of the office this week, but NP will reach out next week.   Oncology Nurse Navigator Documentation     12/20/2021    9:15 AM  Oncology Nurse Navigator Flowsheets  Navigator Follow Up Date: 01/10/2022  Navigator Follow Up Reason: Follow-up Appointment;Chemotherapy  Navigator Location [facility]  Navigator Encounter Type Treatment;Appt/Treatment Plan Review  Patient Visit Type MedOnc  Treatment Phase Active Tx  Barriers/Navigation Needs Coordination of Care;Education  Interventions Psycho-Social Support  Acuity Level 2-Minimal Needs (1-2 Barriers Identified)  Support Groups/Services Friends and Family  Time Spent with Patient 15

## 2021-12-20 NOTE — Progress Notes (Signed)
Hematology and Oncology Follow Up Visit  Rebecca Bullock [id number] 05/07/1966 56 y.o. 12/20/2021   Principle Diagnosis:  Metastatic colorectal cancer-liver metastasis- BRAF (+)   Current Therapy:        Status post cycle 1 of chemotherapy with FOLFOXIRI/Avastin Encorafenib/Vectibix -- started on 06/19/2021, s/p cycle #6 -- d/c on 10/31/2021 FOLFOXIRI -- s/p cycle  -- start on 11/08/2021, s/p cycle 3               Interim History:  Rebecca Bullock is here today for follow-up and treatment. She is doing well and has no complaints at this time.  CEA last visit was 6.69.  She states that her diarrhea has been well controlled with Lomotil.  No fever, chills, n/v, cough, dizziness, SOB, chest pain, palpitations, abdominal pain or changes in bowel or bladder habits.  Her rash is also much improved with minimal noted on the face and chest.  Her left shoulder is still sore and her ROM is limited. She is doing her PT exercises herself at home.  She has had some tingling in her hands when touching cold things for several days after each treatment.  No falls or syncope to report.  No blood loss noted. No bruising or petechiae.  She has maintained a good appetite and is staying well hydrated. Her weight is 179 lbs.   ECOG Performance Status: 1 - Symptomatic but completely ambulatory  Medications:  Allergies as of 12/20/2021       Reactions   Irinotecan Other (See Comments)   Flushing, tingling, visual disturbance   Metronidazole Diarrhea   Vancomycin Rash   ?red syndrome   Augmentin [amoxicillin-pot Clavulanate] Diarrhea   Extreme diarrhea, abdominal pain.        Medication List        Accurate as of December 20, 2021  9:25 AM. If you have any questions, ask your nurse or doctor.          Acetaminophen 325 MG Caps Take 650 mg by mouth every 8 (eight) hours as needed for moderate pain or headache.   betamethasone valerate 0.1 % cream Commonly known as: VALISONE Apply 1 application  topically See admin instructions. Apply 1 application topically every other day as needed for psoriasis flare   dexamethasone 4 MG tablet Commonly known as: DECADRON Take 2 tablets (8 mg total) by mouth daily. Start the day after chemotherapy for 3 days. Take with food.   diphenoxylate-atropine 2.5-0.025 MG tablet Commonly known as: LOMOTIL Take 2 tablets by mouth 4 (four) times daily as needed for diarrhea or loose stools.   encorafenib 75 MG capsule Commonly known as: BRAFTOVI Take 4 capsules (300 mg total) by mouth daily. What changed: how much to take   estradiol 0.1 MG/GM vaginal cream Commonly known as: ESTRACE Place 1 Applicatorful vaginally daily as needed (dryness / irritation).   fluticasone 50 MCG/ACT nasal spray Commonly known as: FLONASE Place 2 sprays into both nostrils daily.   HYDROcodone-acetaminophen 5-325 MG tablet Commonly known as: NORCO/VICODIN Take 1 tablet by mouth every 4 (four) hours as needed for moderate pain.   hydrocortisone 1 % lotion Apply 1 application topically 2 (two) times daily as needed for itching.   LORazepam 0.5 MG tablet Commonly known as: ATIVAN Take 1 tablet (0.5 mg total) by mouth every 6 (six) hours as needed for anxiety.   omeprazole 20 MG tablet Commonly known as: PRILOSEC OTC Take 20 mg by mouth daily as needed (acid reflux).   ondansetron 8  MG tablet Commonly known as: Zofran Take 1 tablet (8 mg total) by mouth 2 (two) times daily as needed. Start on day 3 after chemotherapy.   predniSONE 5 MG tablet Commonly known as: DELTASONE Take 6 tablets by mouth daily on days 1 - 4, then take 4 tablets daily on days 5 - 8, then take 2 tablets daily on days 9 - 12.   prochlorperazine 10 MG tablet Commonly known as: COMPAZINE Take 1 tablet (10 mg total) by mouth every 6 (six) hours as needed (Nausea or vomiting).        Allergies:  Allergies  Allergen Reactions   Irinotecan Other (See Comments)    Flushing, tingling,  visual disturbance   Metronidazole Diarrhea   Vancomycin Rash    ?red syndrome   Augmentin [Amoxicillin-Pot Clavulanate] Diarrhea    Extreme diarrhea, abdominal pain.    Past Medical History, Surgical history, Social history, and Family History were reviewed and updated.  Review of Systems: All other 10 point review of systems is negative.   Physical Exam:  vitals were not taken for this visit.   Wt Readings from Last 3 Encounters:  12/06/21 179 lb (81.2 kg)  11/22/21 180 lb (81.6 kg)  10/31/21 184 lb 0.6 oz (83.5 kg)    Ocular: Sclerae unicteric, pupils equal, round and reactive to light Ear-nose-throat: Oropharynx clear, dentition fair Lymphatic: No cervical or supraclavicular adenopathy Lungs no rales or rhonchi, good excursion bilaterally Heart regular rate and rhythm, no murmur appreciated Abd soft, nontender, positive bowel sounds MSK no focal spinal tenderness, no joint edema Neuro: non-focal, well-oriented, appropriate affect Breasts: Deferred   Lab Results  Component Value Date   WBC 3.2 (L) 12/06/2021   HGB 10.9 (L) 12/06/2021   HCT 35.8 (L) 12/06/2021   MCV 83.4 12/06/2021   PLT 150 12/06/2021   Lab Results  Component Value Date   FERRITIN 130 12/06/2021   IRON 49 12/06/2021   TIBC 322 12/06/2021   UIBC 273 12/06/2021   IRONPCTSAT 15 12/06/2021   Lab Results  Component Value Date   RETICCTPCT 1.8 11/07/2021   RBC 4.29 12/06/2021   No results found for: "KPAFRELGTCHN", "LAMBDASER", "KAPLAMBRATIO" No results found for: "IGGSERUM", "IGA", "IGMSERUM" No results found for: "TOTALPROTELP", "ALBUMINELP", "A1GS", "A2GS", "BETS", "BETA2SER", "GAMS", "MSPIKE", "SPEI"   Chemistry      Component Value Date/Time   NA 140 12/06/2021 1120   K 3.7 12/06/2021 1120   CL 106 12/06/2021 1120   CO2 27 12/06/2021 1120   BUN 8 12/06/2021 1120   CREATININE 0.54 12/06/2021 1120   CREATININE 0.56 03/23/2021 0000      Component Value Date/Time   CALCIUM 9.3  12/06/2021 1120   ALKPHOS 102 12/06/2021 1120   AST 14 (L) 12/06/2021 1120   ALT 11 12/06/2021 1120   BILITOT 0.2 (L) 12/06/2021 1120       Impression and Plan: Rebecca Bullock is a very pleasant 56 yo caucasian female with metastatic colon cancer.  CEA pending.  We will proceed with cycle 4 of treatment.  Dr. Barry Dienes is out of town until next week on Monday. We will check with her then about seeing Rebecca Bullock and getting her so surgery.  Follow-up in 3 weeks.   Lottie Dawson, NP 7/26/20239:25 AM

## 2021-12-20 NOTE — Patient Instructions (Signed)
[facility]  Discharge Instructions: Thank you for choosing [facility] to provide your oncology and hematology care.   If you have a lab appointment with the [facility], please go directly to the [facility] and check in at the registration area.  Wear comfortable clothing and clothing appropriate for easy access to any Portacath or PICC line.   We strive to give you quality time with your provider. You may need to reschedule your appointment if you arrive late (15 or more minutes).  Arriving late affects you and other patients whose appointments are after yours.  Also, if you miss three or more appointments without notifying the office, you may be dismissed from the clinic at the provider's discretion.      For prescription refill requests, have your pharmacy contact our office and allow 72 hours for refills to be completed.    Today you received the following chemotherapy and/or immunotherapy agents Irinotecan, 5-FU and Leucovorin   To help prevent nausea and vomiting after your treatment, we encourage you to take your nausea medication as directed.  BELOW ARE SYMPTOMS THAT SHOULD BE REPORTED IMMEDIATELY: *FEVER GREATER THAN 100.4 F (38 C) OR HIGHER *CHILLS OR SWEATING *NAUSEA AND VOMITING THAT IS NOT CONTROLLED WITH YOUR NAUSEA MEDICATION *UNUSUAL SHORTNESS OF BREATH *UNUSUAL BRUISING OR BLEEDING *URINARY PROBLEMS (pain or burning when urinating, or frequent urination) *BOWEL PROBLEMS (unusual diarrhea, constipation, pain near the anus) TENDERNESS IN MOUTH AND THROAT WITH OR WITHOUT PRESENCE OF ULCERS (sore throat, sores in mouth, or a toothache) UNUSUAL RASH, SWELLING OR PAIN  UNUSUAL VAGINAL DISCHARGE OR ITCHING   Items with * indicate a potential emergency and should be followed up as soon as possible or go to the Emergency Department if any problems should occur.  Please show the CHEMOTHERAPY ALERT CARD or IMMUNOTHERAPY ALERT CARD  at check-in to the Emergency Department and triage nurse. Should you have questions after your visit or need to cancel or reschedule your appointment, please contact [facility]  [phone] and follow the prompts.  Office hours are 8:00 a.m. to 4:30 p.m. Monday - Friday. Please note that voicemails left after 4:00 p.m. may not be returned until the following business day.  We are closed weekends and major holidays. You have access to a nurse at all times for urgent questions. Please call the main number to the clinic [phone] and follow the prompts.  For any non-urgent questions, you may also contact your provider using MyChart. We now offer e-Visits for anyone 30 and older to request care online for non-urgent symptoms. For details visit [email or web address].   Also download the MyChart app! Go to the app store, search "MyChart", open the app, select [facility], and log in with your MyChart username and password.  Masks are optional in the cancer centers. If you would like for your care team to wear a mask while they are taking care of you, please let them know. For doctor visits, patients may have with them one support person who is at least 54 years old. At this time, visitors are not allowed in the infusion area.

## 2021-12-21 ENCOUNTER — Encounter: Payer: Self-pay | Admitting: Family

## 2021-12-21 ENCOUNTER — Encounter: Payer: Self-pay | Admitting: Hematology & Oncology

## 2021-12-21 ENCOUNTER — Other Ambulatory Visit: Payer: Self-pay

## 2021-12-22 ENCOUNTER — Inpatient Hospital Stay: Payer: Medicaid Other

## 2021-12-22 VITALS — BP 123/58 | HR 70 | Temp 98.0°F | Resp 17

## 2021-12-22 DIAGNOSIS — C189 Malignant neoplasm of colon, unspecified: Secondary | ICD-10-CM

## 2021-12-22 DIAGNOSIS — Z5111 Encounter for antineoplastic chemotherapy: Secondary | ICD-10-CM | POA: Diagnosis not present

## 2021-12-22 MED ORDER — HEPARIN SOD (PORK) LOCK FLUSH 100 UNIT/ML IV SOLN
500.0000 [IU] | Freq: Once | INTRAVENOUS | Status: AC | PRN
  Administered 2021-12-22: 500 [IU]

## 2021-12-22 MED ORDER — SODIUM CHLORIDE 0.9% FLUSH
10.0000 mL | INTRAVENOUS | Status: DC | PRN
  Administered 2021-12-22: 10 mL

## 2021-12-22 NOTE — Patient Instructions (Signed)
Fluorouracil, 5-FU injection What is this medication? FLUOROURACIL, 5-FU (flure oh YOOR a sil) is a chemotherapy drug. It slows the growth of cancer cells. This medicine is used to treat many types of cancer like breast cancer, colon or rectal cancer, pancreatic cancer, and stomach cancer. This medicine may be used for other purposes; ask your health care provider or pharmacist if you have questions. COMMON BRAND NAME(S): Adrucil What should I tell my care team before I take this medication? They need to know if you have any of these conditions: blood disorders dihydropyrimidine dehydrogenase (DPD) deficiency infection (especially a virus infection such as chickenpox, cold sores, or herpes) kidney disease liver disease malnourished, poor nutrition recent or ongoing radiation therapy an unusual or allergic reaction to fluorouracil, other chemotherapy, other medicines, foods, dyes, or preservatives pregnant or trying to get pregnant breast-feeding How should I use this medication? This drug is given as an infusion or injection into a vein. It is administered in a hospital or clinic by a specially trained health care professional. Talk to your pediatrician regarding the use of this medicine in children. Special care may be needed. Overdosage: If you think you have taken too much of this medicine contact a poison control center or emergency room at once. NOTE: This medicine is only for you. Do not share this medicine with others. What if I miss a dose? It is important not to miss your dose. Call your doctor or health care professional if you are unable to keep an appointment. What may interact with this medication? Do not take this medicine with any of the following medications: live virus vaccines This medicine may also interact with the following medications: medicines that treat or prevent blood clots like warfarin, enoxaparin, and dalteparin This list may not describe all possible  interactions. Give your health care provider a list of all the medicines, herbs, non-prescription drugs, or dietary supplements you use. Also tell them if you smoke, drink alcohol, or use illegal drugs. Some items may interact with your medicine. What should I watch for while using this medication? Visit your doctor for checks on your progress. This drug may make you feel generally unwell. This is not uncommon, as chemotherapy can affect healthy cells as well as cancer cells. Report any side effects. Continue your course of treatment even though you feel ill unless your doctor tells you to stop. In some cases, you may be given additional medicines to help with side effects. Follow all directions for their use. Call your doctor or health care professional for advice if you get a fever, chills or sore throat, or other symptoms of a cold or flu. Do not treat yourself. This drug decreases your body's ability to fight infections. Try to avoid being around people who are sick. This medicine may increase your risk to bruise or bleed. Call your doctor or health care professional if you notice any unusual bleeding. Be careful brushing and flossing your teeth or using a toothpick because you may get an infection or bleed more easily. If you have any dental work done, tell your dentist you are receiving this medicine. Avoid taking products that contain aspirin, acetaminophen, ibuprofen, naproxen, or ketoprofen unless instructed by your doctor. These medicines may hide a fever. Do not become pregnant while taking this medicine. Women should inform their doctor if they wish to become pregnant or think they might be pregnant. There is a potential for serious side effects to an unborn child. Talk to your health care   professional or pharmacist for more information. Do not breast-feed an infant while taking this medicine. Men should inform their doctor if they wish to father a child. This medicine may lower sperm  counts. Do not treat diarrhea with over the counter products. Contact your doctor if you have diarrhea that lasts more than 2 days or if it is severe and watery. This medicine can make you more sensitive to the sun. Keep out of the sun. If you cannot avoid being in the sun, wear protective clothing and use sunscreen. Do not use sun lamps or tanning beds/booths. What side effects may I notice from receiving this medication? Side effects that you should report to your doctor or health care professional as soon as possible: allergic reactions like skin rash, itching or hives, swelling of the face, lips, or tongue low blood counts - this medicine may decrease the number of white blood cells, red blood cells and platelets. You may be at increased risk for infections and bleeding. signs of infection - fever or chills, cough, sore throat, pain or difficulty passing urine signs of decreased platelets or bleeding - bruising, pinpoint red spots on the skin, black, tarry stools, blood in the urine signs of decreased red blood cells - unusually weak or tired, fainting spells, lightheadedness breathing problems changes in vision chest pain mouth sores nausea and vomiting pain, swelling, redness at site where injected pain, tingling, numbness in the hands or feet redness, swelling, or sores on hands or feet stomach pain unusual bleeding Side effects that usually do not require medical attention (report to your doctor or health care professional if they continue or are bothersome): changes in finger or toe nails diarrhea dry or itchy skin hair loss headache loss of appetite sensitivity of eyes to the light stomach upset unusually teary eyes This list may not describe all possible side effects. Call your doctor for medical advice about side effects. You may report side effects to FDA at 1-800-FDA-1088. Where should I keep my medication? This drug is given in a hospital or clinic and will not be  stored at home. NOTE: This sheet is a summary. It may not cover all possible information. If you have questions about this medicine, talk to your doctor, pharmacist, or health care provider.  2023 Elsevier/Gold Standard (2021-04-14 00:00:00)  

## 2021-12-26 ENCOUNTER — Other Ambulatory Visit: Payer: Self-pay

## 2022-01-03 ENCOUNTER — Encounter: Payer: Self-pay | Admitting: Hematology & Oncology

## 2022-01-03 ENCOUNTER — Encounter: Payer: Self-pay | Admitting: Family

## 2022-01-10 ENCOUNTER — Inpatient Hospital Stay (HOSPITAL_BASED_OUTPATIENT_CLINIC_OR_DEPARTMENT_OTHER): Payer: Self-pay | Admitting: Hematology & Oncology

## 2022-01-10 ENCOUNTER — Inpatient Hospital Stay: Payer: Self-pay | Attending: Hematology & Oncology

## 2022-01-10 ENCOUNTER — Inpatient Hospital Stay: Payer: Self-pay

## 2022-01-10 ENCOUNTER — Encounter: Payer: Self-pay | Admitting: *Deleted

## 2022-01-10 ENCOUNTER — Other Ambulatory Visit: Payer: Self-pay

## 2022-01-10 ENCOUNTER — Encounter: Payer: Self-pay | Admitting: Hematology & Oncology

## 2022-01-10 VITALS — BP 135/77 | HR 79 | Temp 97.8°F | Resp 18 | Ht 61.0 in | Wt 182.0 lb

## 2022-01-10 DIAGNOSIS — C787 Secondary malignant neoplasm of liver and intrahepatic bile duct: Secondary | ICD-10-CM

## 2022-01-10 DIAGNOSIS — C189 Malignant neoplasm of colon, unspecified: Secondary | ICD-10-CM

## 2022-01-10 DIAGNOSIS — D509 Iron deficiency anemia, unspecified: Secondary | ICD-10-CM | POA: Insufficient documentation

## 2022-01-10 DIAGNOSIS — Z452 Encounter for adjustment and management of vascular access device: Secondary | ICD-10-CM | POA: Insufficient documentation

## 2022-01-10 DIAGNOSIS — Z5111 Encounter for antineoplastic chemotherapy: Secondary | ICD-10-CM | POA: Insufficient documentation

## 2022-01-10 DIAGNOSIS — C182 Malignant neoplasm of ascending colon: Secondary | ICD-10-CM | POA: Insufficient documentation

## 2022-01-10 LAB — RETICULOCYTES
Immature Retic Fract: 16.6 % — ABNORMAL HIGH (ref 2.3–15.9)
RBC.: 4.21 MIL/uL (ref 3.87–5.11)
Retic Count, Absolute: 81.3 10*3/uL (ref 19.0–186.0)
Retic Ct Pct: 1.9 % (ref 0.4–3.1)

## 2022-01-10 LAB — CMP (CANCER CENTER ONLY)
ALT: 16 U/L (ref 0–44)
AST: 19 U/L (ref 15–41)
Albumin: 4 g/dL (ref 3.5–5.0)
Alkaline Phosphatase: 122 U/L (ref 38–126)
Anion gap: 9 (ref 5–15)
BUN: 9 mg/dL (ref 6–20)
CO2: 26 mmol/L (ref 22–32)
Calcium: 9.4 mg/dL (ref 8.9–10.3)
Chloride: 105 mmol/L (ref 98–111)
Creatinine: 0.6 mg/dL (ref 0.44–1.00)
GFR, Estimated: >60 mL/min (ref >60)
Glucose, Bld: 103 mg/dL — ABNORMAL HIGH (ref 70–99)
Potassium: 3.6 mmol/L (ref 3.5–5.1)
Sodium: 140 mmol/L (ref 135–145)
Total Bilirubin: 0.2 mg/dL — ABNORMAL LOW (ref 0.3–1.2)
Total Protein: 7.1 g/dL (ref 6.5–8.1)

## 2022-01-10 LAB — CBC WITH DIFFERENTIAL (CANCER CENTER ONLY)
Abs Immature Granulocytes: 0.04 10*3/uL (ref 0.00–0.07)
Basophils Absolute: 0 10*3/uL (ref 0.0–0.1)
Basophils Relative: 0 %
Eosinophils Absolute: 0.2 10*3/uL (ref 0.0–0.5)
Eosinophils Relative: 4 %
HCT: 36.2 % (ref 36.0–46.0)
Hemoglobin: 11.5 g/dL — ABNORMAL LOW (ref 12.0–15.0)
Immature Granulocytes: 1 %
Lymphocytes Relative: 34 %
Lymphs Abs: 1.3 10*3/uL (ref 0.7–4.0)
MCH: 27.3 pg (ref 26.0–34.0)
MCHC: 31.8 g/dL (ref 30.0–36.0)
MCV: 86 fL (ref 80.0–100.0)
Monocytes Absolute: 0.7 10*3/uL (ref 0.1–1.0)
Monocytes Relative: 17 %
Neutro Abs: 1.6 10*3/uL — ABNORMAL LOW (ref 1.7–7.7)
Neutrophils Relative %: 44 %
Platelet Count: 221 10*3/uL (ref 150–400)
RBC: 4.21 MIL/uL (ref 3.87–5.11)
RDW: 20.8 % — ABNORMAL HIGH (ref 11.5–15.5)
WBC Count: 3.8 10*3/uL — ABNORMAL LOW (ref 4.0–10.5)
nRBC: 0 % (ref 0.0–0.2)

## 2022-01-10 LAB — FERRITIN: Ferritin: 87 ng/mL (ref 11–307)

## 2022-01-10 LAB — IRON AND IRON BINDING CAPACITY (CC-WL,HP ONLY)
Iron: 54 ug/dL (ref 28–170)
Saturation Ratios: 14 % (ref 10.4–31.8)
TIBC: 377 ug/dL (ref 250–450)
UIBC: 323 ug/dL (ref 148–442)

## 2022-01-10 LAB — CEA (IN HOUSE-CHCC): CEA (CHCC-In House): 7.87 ng/mL — ABNORMAL HIGH (ref 0.00–5.00)

## 2022-01-10 MED ORDER — HEPARIN SOD (PORK) LOCK FLUSH 100 UNIT/ML IV SOLN
500.0000 [IU] | Freq: Once | INTRAVENOUS | Status: AC
  Administered 2022-01-10: 500 [IU] via INTRAVENOUS

## 2022-01-10 MED ORDER — SODIUM CHLORIDE 0.9% FLUSH
10.0000 mL | Freq: Once | INTRAVENOUS | Status: AC
  Administered 2022-01-10: 10 mL via INTRAVENOUS

## 2022-01-10 NOTE — Progress Notes (Signed)
Hematology and Oncology Follow Up Visit  Rebecca Bullock [id number] 08/21/1965 56 y.o. 01/10/2022   Principle Diagnosis:  Metastatic colorectal cancer-liver metastasis- BRAF (+)   Current Therapy:        Status post cycle 1 of chemotherapy with FOLFOXIRI/Avastin Encorafenib/Vectibix -- started on 06/19/2021, s/p cycle #6 -- d/c on 10/31/2021 FOLFOXIRI -- s/p cycle  #4-- start on 11/08/2021,                Interim History:  Rebecca Bullock is here today for follow-up.  She was seen out of [facility].  She saw Dr. [name] of Surgical Oncology.  He will go ahead and do a partial hepatectomy and remove the primary in the ascending colon.  This is scheduled for August 30.  She saw one of the medical oncologist at [facility].  He will do intrahepatic therapy with a 5-FU pump.  I think this is very reasonable.  Her last CEA level was 8.2.  While at [facility], she will also get systemic chemotherapy.  We are not clearly been incredibly aggressive.  We needed to be since she is only 27 is all with excellent health.  Hopefully, we will get her into a long-term remission.  She has had no problems with abdominal pain.  There is no change in bowel or bladder habits.  She has had no cough or shortness of breath.  She has had no nausea or vomiting.  She comes in with her daughter.  Another big event is that her first grandchild will be born in October.  This is from her oldest daughter.  I want to make sure that she will be good to go for the birth.  I would think that she should be.  Currently, I would say performance status is probably ECOG 1.  Medications:  Allergies as of 01/10/2022       Reactions   Augmentin [amoxicillin-pot Clavulanate] Diarrhea   Extreme diarrhea, abdominal pain.   Irinotecan Other (See Comments)   Flushing, tingling, visual disturbance   Metronidazole Diarrhea   Vancomycin Rash   ?red syndrome        Medication List        Accurate as of January 10, 2022  9:34 AM. If you have  any questions, ask your nurse or doctor.          STOP taking these medications    predniSONE 5 MG tablet Commonly known as: DELTASONE Stopped by: [name], MD       TAKE these medications    Acetaminophen 325 MG Caps Take 650 mg by mouth every 8 (eight) hours as needed for moderate pain or headache.   betamethasone valerate 0.1 % cream Commonly known as: VALISONE Apply 1 application  topically See admin instructions. Apply 1 application topically every other day as needed for psoriasis flare   dexamethasone 4 MG tablet Commonly known as: DECADRON Take 2 tablets (8 mg total) by mouth daily. Start the day after chemotherapy for 3 days. Take with food.   diphenoxylate-atropine 2.5-0.025 MG tablet Commonly known as: LOMOTIL Take 2 tablets by mouth 4 (four) times daily as needed for diarrhea or loose stools.   encorafenib 75 MG capsule Commonly known as: BRAFTOVI Take 4 capsules (300 mg total) by mouth daily.   estradiol 0.1 MG/GM vaginal cream Commonly known as: ESTRACE Place 1 Applicatorful vaginally daily as needed (dryness / irritation).   fluticasone 50 MCG/ACT nasal spray Commonly known as: FLONASE Place 2 sprays into both nostrils daily.  HYDROcodone-acetaminophen 5-325 MG tablet Commonly known as: NORCO/VICODIN Take 1 tablet by mouth every 4 (four) hours as needed for moderate pain.   hydrocortisone 1 % lotion Apply 1 application topically 2 (two) times daily as needed for itching.   LORazepam 0.5 MG tablet Commonly known as: ATIVAN Take 1 tablet (0.5 mg total) by mouth every 6 (six) hours as needed for anxiety.   omeprazole 20 MG tablet Commonly known as: PRILOSEC OTC Take 20 mg by mouth daily as needed (acid reflux).   ondansetron 8 MG tablet Commonly known as: Zofran Take 1 tablet (8 mg total) by mouth 2 (two) times daily as needed. Start on day 3 after chemotherapy.   prochlorperazine 10 MG tablet Commonly known as: COMPAZINE Take 1  tablet (10 mg total) by mouth every 6 (six) hours as needed (Nausea or vomiting).        Allergies:  Allergies  Allergen Reactions   Augmentin [Amoxicillin-Pot Clavulanate] Diarrhea    Extreme diarrhea, abdominal pain.   Irinotecan Other (See Comments)    Flushing, tingling, visual disturbance   Metronidazole Diarrhea   Vancomycin Rash    ?red syndrome    Past Medical History, Surgical history, Social history, and Family History were reviewed and updated.  Review of Systems: Review of Systems  Constitutional: Negative.   HENT: Negative.    Eyes: Negative.   Respiratory: Negative.    Cardiovascular: Negative.   Gastrointestinal: Negative.   Genitourinary: Negative.   Musculoskeletal: Negative.   Skin: Negative.   Neurological: Negative.   Endo/Heme/Allergies: Negative.   Psychiatric/Behavioral: Negative.       Physical Exam:  height is '5\' 1"'  (1.549 m) and weight is 182 lb (82.6 kg). Her oral temperature is 97.8 F (36.6 C). Her blood pressure is 135/77 and her pulse is 79. Her respiration is 18 and oxygen saturation is 100%.   Wt Readings from Last 3 Encounters:  01/10/22 182 lb (82.6 kg)  12/20/21 179 lb (81.2 kg)  12/06/21 179 lb (81.2 kg)    Physical Exam Vitals reviewed.  HENT:     Head: Normocephalic and atraumatic.  Eyes:     Pupils: Pupils are equal, round, and reactive to light.  Cardiovascular:     Rate and Rhythm: Normal rate and regular rhythm.     Heart sounds: Normal heart sounds.  Pulmonary:     Effort: Pulmonary effort is normal.     Breath sounds: Normal breath sounds.  Abdominal:     General: Bowel sounds are normal.     Palpations: Abdomen is soft.  Musculoskeletal:        General: No tenderness or deformity. Normal range of motion.     Cervical back: Normal range of motion.  Lymphadenopathy:     Cervical: No cervical adenopathy.  Skin:    General: Skin is warm and dry.     Findings: No erythema or rash.  Neurological:     Mental  Status: She is alert and oriented to person, place, and time.  Psychiatric:        Behavior: Behavior normal.        Thought Content: Thought content normal.        Judgment: Judgment normal.      Lab Results  Component Value Date   WBC 3.8 (L) 01/10/2022   HGB 11.5 (L) 01/10/2022   HCT 36.2 01/10/2022   MCV 86.0 01/10/2022   PLT 221 01/10/2022   Lab Results  Component Value Date   FERRITIN 163  12/20/2021   IRON 59 12/20/2021   TIBC 363 12/20/2021   UIBC 304 12/20/2021   IRONPCTSAT 16 12/20/2021   Lab Results  Component Value Date   RETICCTPCT 1.9 01/10/2022   RBC 4.21 01/10/2022   No results found for: "KPAFRELGTCHN", "LAMBDASER", "KAPLAMBRATIO" No results found for: "IGGSERUM", "IGA", "IGMSERUM" No results found for: "TOTALPROTELP", "ALBUMINELP", "A1GS", "A2GS", "BETS", "BETA2SER", "GAMS", "MSPIKE", "SPEI"   Chemistry      Component Value Date/Time   NA 140 01/10/2022 0759   K 3.6 01/10/2022 0759   CL 105 01/10/2022 0759   CO2 26 01/10/2022 0759   BUN 9 01/10/2022 0759   CREATININE 0.60 01/10/2022 0759   CREATININE 0.56 03/23/2021 0000      Component Value Date/Time   CALCIUM 9.4 01/10/2022 0759   ALKPHOS 122 01/10/2022 0759   AST 19 01/10/2022 0759   ALT 16 01/10/2022 0759   BILITOT 0.2 (L) 01/10/2022 0759       Impression and Plan: Rebecca Bullock is a very pleasant 56 yo caucasian female with metastatic colon cancer.  Again, her disease has been confined to her liver.  She has had a very nice response to treatment.  Hopefully, we will be able to have the disease resected both the liver and in the primary in the colon.  Will be interesting to see what her CEA level is today.  I do not see any problems with her having surgery in a couple weeks.  We will not treat her today.  I suspect that we probably will not see her back for several months.  I will not set up a follow-up appointment for her until she is finished with her treatments at [facility].   [name], MD 8/16/20239:34 AM

## 2022-01-10 NOTE — Patient Instructions (Signed)

## 2022-01-10 NOTE — Progress Notes (Signed)
Patient is scheduled for surgery on 01/24/2022. She will hold on any treatment until that time. We will follow up once her care is completed at [facility].   Oncology Nurse Navigator Documentation     01/10/2022    9:15 AM  Oncology Nurse Navigator Flowsheets  Planned Course of Treatment Surgery  Phase of Treatment Surgery  Navigator Follow Up Date: 01/24/2022  Navigator Follow Up Reason: Surgery  Navigator Location [facility]  Navigator Encounter Type Follow-up Appt  Patient Visit Type MedOnc  Treatment Phase Active Tx  Barriers/Navigation Needs Coordination of Care;Education  Interventions Psycho-Social Support  Acuity Level 2-Minimal Needs (1-2 Barriers Identified)  Support Groups/Services Friends and Family  Time Spent with Patient 15

## 2022-01-11 ENCOUNTER — Telehealth: Payer: Self-pay | Admitting: *Deleted

## 2022-01-11 NOTE — Telephone Encounter (Signed)
Per scheduling message Lorriane Shire - called and gave upcoming appointment (1) dose of IV Iron - confirmed

## 2022-01-12 ENCOUNTER — Inpatient Hospital Stay (HOSPITAL_BASED_OUTPATIENT_CLINIC_OR_DEPARTMENT_OTHER): Payer: Self-pay | Admitting: Hematology & Oncology

## 2022-01-12 ENCOUNTER — Inpatient Hospital Stay: Payer: Self-pay

## 2022-01-12 VITALS — BP 108/77 | HR 88 | Temp 97.8°F | Resp 17

## 2022-01-12 VITALS — BP 122/74 | HR 88 | Temp 97.8°F | Resp 18 | Wt 182.0 lb

## 2022-01-12 DIAGNOSIS — D649 Anemia, unspecified: Secondary | ICD-10-CM

## 2022-01-12 DIAGNOSIS — D509 Iron deficiency anemia, unspecified: Secondary | ICD-10-CM

## 2022-01-12 DIAGNOSIS — C189 Malignant neoplasm of colon, unspecified: Secondary | ICD-10-CM

## 2022-01-12 DIAGNOSIS — C787 Secondary malignant neoplasm of liver and intrahepatic bile duct: Secondary | ICD-10-CM

## 2022-01-12 MED ORDER — HEPARIN SOD (PORK) LOCK FLUSH 100 UNIT/ML IV SOLN
500.0000 [IU] | Freq: Once | INTRAVENOUS | Status: AC
  Administered 2022-01-12: 500 [IU] via INTRAVENOUS

## 2022-01-12 MED ORDER — SODIUM CHLORIDE 0.9 % IV SOLN
125.0000 mg | Freq: Once | INTRAVENOUS | Status: AC
  Administered 2022-01-12: 125 mg via INTRAVENOUS
  Filled 2022-01-12: qty 10

## 2022-01-12 MED ORDER — SODIUM CHLORIDE 0.9% FLUSH
10.0000 mL | Freq: Once | INTRAVENOUS | Status: AC
  Administered 2022-01-12: 10 mL via INTRAVENOUS

## 2022-01-12 MED ORDER — SODIUM CHLORIDE 0.9 % IV SOLN: Freq: Once | INTRAVENOUS | Status: AC

## 2022-01-12 NOTE — Patient Instructions (Signed)
Sodium Ferric Gluconate Complex Injection What is this medication? SODIUM FERRIC GLUCONATE COMPLEX (SOE dee um FER ik GLOO koe nate KOM pleks) treats low levels of iron (iron deficiency anemia) in people with kidney disease. Iron is a mineral that plays an important role in making red blood cells, which carry oxygen from your lungs to the rest of your body. This medicine may be used for other purposes; ask your health care provider or pharmacist if you have questions. COMMON BRAND NAME(S): Ferrlecit, Nulecit What should I tell my care team before I take this medication? They need to know if you have any of the following conditions: Anemia that is not from iron deficiency High levels of iron in the blood An unusual or allergic reaction to iron, other medications, foods, dyes, or preservatives Pregnant or are trying to become pregnant Breast-feeding How should I use this medication? This medication is injected into a vein. It is given by your care team in a hospital or clinic setting. Talk to your care team about the use of this medication in children. While it may be prescribed for children as young as 6 years for selected conditions, precautions do apply. Overdosage: If you think you have taken too much of this medicine contact a poison control center or emergency room at once. NOTE: This medicine is only for you. Do not share this medicine with others. What if I miss a dose? It is important not to miss your dose. Call your care team if you are unable to keep an appointment. What may interact with this medication? Do not take this medication with any of the following: Deferasirox Deferoxamine Dimercaprol This medication may also interact with the following: Other iron products This list may not describe all possible interactions. Give your health care provider a list of all the medicines, herbs, non-prescription drugs, or dietary supplements you use. Also tell them if you smoke, drink  alcohol, or use illegal drugs. Some items may interact with your medicine. What should I watch for while using this medication? Your condition will be monitored carefully while you are receiving this medication. Visit your care team for regular checks on your progress. You may need blood work while you are taking this medication. What side effects may I notice from receiving this medication? Side effects that you should report to your care team as soon as possible: Allergic reactions--skin rash, itching, hives, swelling of the face, lips, tongue, or throat Low blood pressure--dizziness, feeling faint or lightheaded, blurry vision Shortness of breath Side effects that usually do not require medical attention (report to your care team if they continue or are bothersome): Flushing Headache Joint pain Muscle pain Nausea Pain, redness, or irritation at injection site This list may not describe all possible side effects. Call your doctor for medical advice about side effects. You may report side effects to FDA at 1-800-FDA-1088. Where should I keep my medication? This medication is given in a hospital or clinic and will not be stored at home. NOTE: This sheet is a summary. It may not cover all possible information. If you have questions about this medicine, talk to your doctor, pharmacist, or health care provider.  2023 Elsevier/Gold Standard (2020-10-07 00:00:00)  

## 2022-01-12 NOTE — Progress Notes (Signed)
ON PATHWAY REGIMEN - Colorectal  No Change  Continue With Treatment as Ordered.  Original Decision Date/Time: 05/05/2021 14:59     A cycle is every 14 days:     Bevacizumab-xxxx      Irinotecan      Oxaliplatin      Leucovorin      Fluorouracil   **Always confirm dose/schedule in your pharmacy ordering system**  Patient Characteristics: Distant Metastases, Nonsurgical Candidate, KRAS/NRAS Mutation Positive/Unknown (BRAF V600 Wild-Type/Unknown), Standard Cytotoxic Therapy, First Line Standard Cytotoxic Therapy, Bevacizumab Eligible, PS = 0,1 Tumor Location: Colon Therapeutic Status: Distant Metastases Microsatellite/Mismatch Repair Status: Unknown BRAF Mutation Status: Awaiting Test Results KRAS/NRAS Mutation Status: Awaiting Test Results Standard Cytotoxic Line of Therapy: First Line Standard Cytotoxic Therapy ECOG Performance Status: 0 Bevacizumab Eligibility: Eligible Intent of Therapy: Curative Intent, Discussed with Patient

## 2022-01-12 NOTE — Progress Notes (Signed)
Hematology and Oncology Follow Up Visit  Rebecca Bullock [id number] [birth year] 55 y.o. 01/12/2022   Principle Diagnosis:  Metastatic colorectal cancer-liver metastasis- BRAF (+)   Current Therapy:        Status post cycle 1 of chemotherapy with FOLFOXIRI/Avastin Encorafenib/Vectibix -- started on 06/19/2021, s/p cycle #6 -- d/c on 10/31/2021 FOLFOXIRI -- s/p cycle  #4-- start on 11/08/2021,                Interim History:  Rebecca Bullock is here today for follow-up.  She was seen out of [facility].  She saw Dr. [name] of Surgical Oncology.  He will go ahead and do a partial hepatectomy and remove the primary in the ascending colon.  This is scheduled for August 30.  She saw one of the medical oncologist at [facility].  He will do intrahepatic therapy with a 5-FU pump.  I think this is very reasonable.  Her last CEA level was 8.2.  While at [facility], she will also get systemic chemotherapy.  We are not clearly been incredibly aggressive.  We needed to be since she is only 20 is all with excellent health.  Hopefully, we will get her into a long-term remission.  She has had no problems with abdominal pain.  There is no change in bowel or bladder habits.  She has had no cough or shortness of breath.  She has had no nausea or vomiting.  She comes in with her daughter.  Another big event is that her first grandchild will be born in October.  This is from her oldest daughter.  I want to make sure that she will be good to go for the birth.  I would think that she should be.  Currently, I would say performance status is probably ECOG 1.  Medications:  Allergies as of 01/12/2022       Reactions   Augmentin [amoxicillin-pot Clavulanate] Diarrhea   Extreme diarrhea, abdominal pain.   Irinotecan Other (See Comments)   Flushing, tingling, visual disturbance   Metronidazole Diarrhea   Vancomycin Rash   ?red syndrome        Medication List        Accurate as of January 12, 2022  2:02 PM. If you have  any questions, ask your nurse or doctor.          Acetaminophen 325 MG Caps Take 650 mg by mouth every 8 (eight) hours as needed for moderate pain or headache.   betamethasone valerate 0.1 % cream Commonly known as: VALISONE Apply 1 application  topically See admin instructions. Apply 1 application topically every other day as needed for psoriasis flare   dexamethasone 4 MG tablet Commonly known as: DECADRON Take 2 tablets (8 mg total) by mouth daily. Start the day after chemotherapy for 3 days. Take with food.   diphenoxylate-atropine 2.5-0.025 MG tablet Commonly known as: LOMOTIL Take 2 tablets by mouth 4 (four) times daily as needed for diarrhea or loose stools.   encorafenib 75 MG capsule Commonly known as: BRAFTOVI Take 4 capsules (300 mg total) by mouth daily.   estradiol 0.1 MG/GM vaginal cream Commonly known as: ESTRACE Place 1 Applicatorful vaginally daily as needed (dryness / irritation).   fluticasone 50 MCG/ACT nasal spray Commonly known as: FLONASE Place 2 sprays into both nostrils daily.   HYDROcodone-acetaminophen 5-325 MG tablet Commonly known as: NORCO/VICODIN Take 1 tablet by mouth every 4 (four) hours as needed for moderate pain.   hydrocortisone 1 % lotion Apply 1 application  topically 2 (two) times daily as needed for itching.   LORazepam 0.5 MG tablet Commonly known as: ATIVAN Take 1 tablet (0.5 mg total) by mouth every 6 (six) hours as needed for anxiety.   omeprazole 20 MG tablet Commonly known as: PRILOSEC OTC Take 20 mg by mouth daily as needed (acid reflux).   ondansetron 8 MG tablet Commonly known as: Zofran Take 1 tablet (8 mg total) by mouth 2 (two) times daily as needed. Start on day 3 after chemotherapy.   prochlorperazine 10 MG tablet Commonly known as: COMPAZINE Take 1 tablet (10 mg total) by mouth every 6 (six) hours as needed (Nausea or vomiting).        Allergies:  Allergies  Allergen Reactions   Augmentin  [Amoxicillin-Pot Clavulanate] Diarrhea    Extreme diarrhea, abdominal pain.   Irinotecan Other (See Comments)    Flushing, tingling, visual disturbance   Metronidazole Diarrhea   Vancomycin Rash    ?red syndrome    Past Medical History, Surgical history, Social history, and Family History were reviewed and updated.  Review of Systems: Review of Systems  Constitutional: Negative.   HENT: Negative.    Eyes: Negative.   Respiratory: Negative.    Cardiovascular: Negative.   Gastrointestinal: Negative.   Genitourinary: Negative.   Musculoskeletal: Negative.   Skin: Negative.   Neurological: Negative.   Endo/Heme/Allergies: Negative.   Psychiatric/Behavioral: Negative.       Physical Exam:  weight is 182 lb (82.6 kg). Her oral temperature is 97.8 F (36.6 C). Her blood pressure is 122/74 and her pulse is 88. Her respiration is 18 and oxygen saturation is 100%.   Wt Readings from Last 3 Encounters:  01/12/22 182 lb (82.6 kg)  01/10/22 182 lb (82.6 kg)  12/20/21 179 lb (81.2 kg)    Physical Exam Vitals reviewed.  HENT:     Head: Normocephalic and atraumatic.  Eyes:     Pupils: Pupils are equal, round, and reactive to light.  Cardiovascular:     Rate and Rhythm: Normal rate and regular rhythm.     Heart sounds: Normal heart sounds.  Pulmonary:     Effort: Pulmonary effort is normal.     Breath sounds: Normal breath sounds.  Abdominal:     General: Bowel sounds are normal.     Palpations: Abdomen is soft.  Musculoskeletal:        General: No tenderness or deformity. Normal range of motion.     Cervical back: Normal range of motion.  Lymphadenopathy:     Cervical: No cervical adenopathy.  Skin:    General: Skin is warm and dry.     Findings: No erythema or rash.  Neurological:     Mental Status: She is alert and oriented to person, place, and time.  Psychiatric:        Behavior: Behavior normal.        Thought Content: Thought content normal.        Judgment:  Judgment normal.      Lab Results  Component Value Date   WBC 3.8 (L) 01/10/2022   HGB 11.5 (L) 01/10/2022   HCT 36.2 01/10/2022   MCV 86.0 01/10/2022   PLT 221 01/10/2022   Lab Results  Component Value Date   FERRITIN 87 01/10/2022   IRON 54 01/10/2022   TIBC 377 01/10/2022   UIBC 323 01/10/2022   IRONPCTSAT 14 01/10/2022   Lab Results  Component Value Date   RETICCTPCT 1.9 01/10/2022   RBC 4.21  01/10/2022   No results found for: "KPAFRELGTCHN", "LAMBDASER", "KAPLAMBRATIO" No results found for: "IGGSERUM", "IGA", "IGMSERUM" No results found for: "TOTALPROTELP", "ALBUMINELP", "A1GS", "A2GS", "BETS", "BETA2SER", "GAMS", "MSPIKE", "SPEI"   Chemistry      Component Value Date/Time   NA 140 01/10/2022 0759   K 3.6 01/10/2022 0759   CL 105 01/10/2022 0759   CO2 26 01/10/2022 0759   BUN 9 01/10/2022 0759   CREATININE 0.60 01/10/2022 0759   CREATININE 0.56 03/23/2021 0000      Component Value Date/Time   CALCIUM 9.4 01/10/2022 0759   ALKPHOS 122 01/10/2022 0759   AST 19 01/10/2022 0759   ALT 16 01/10/2022 0759   BILITOT 0.2 (L) 01/10/2022 0759       Impression and Plan: Rebecca Bullock is a very pleasant 56 yo caucasian female with metastatic colon cancer.  Again, her disease has been confined to her liver.  She has had a very nice response to treatment.  Hopefully, we will be able to have the disease resected both the liver and in the primary in the colon.  Will be interesting to see what her CEA level is today.  I do not see any problems with her having surgery in a couple weeks.  We will not treat her today.  I suspect that we probably will not see her back for several months.  I will not set up a follow-up appointment for her until she is finished with her treatments at [facility].   [name], MD 8/18/20232:02 PM   ADDENDUM: There apparently has been a change in plans.  [facility] does not wish to operate on her right now.  She has a portacaval lymph node  that they are worried about.  They would like to try to shrink this lymph node down before they plan to operate.  As such, we are going to have to continue her on treatment.  I will get her restarted on FOLFOXIRI/Avastin next week.  We will give her 5 cycles.  We will check her CEA.  Her CEA on 01/10/2022 was 7.87.  I really hate this for her.  However, I still think that she will be able to have surgery.  I really think that surgery is going be the only way that we can potentially cure this.  She really has oligometastatic disease that we can hopefully try to eradicate with treatment and surgery.  We will start treatment next week.  I will then see her back for her sixth cycle of treatment in early September.

## 2022-01-16 ENCOUNTER — Encounter: Payer: Self-pay | Admitting: Hematology & Oncology

## 2022-01-16 ENCOUNTER — Encounter: Payer: Self-pay | Admitting: Family

## 2022-01-17 ENCOUNTER — Inpatient Hospital Stay: Payer: Medicaid Other

## 2022-01-17 ENCOUNTER — Encounter: Payer: Self-pay | Admitting: *Deleted

## 2022-01-17 ENCOUNTER — Other Ambulatory Visit: Payer: Self-pay

## 2022-01-17 VITALS — BP 132/78 | HR 88 | Temp 98.0°F | Resp 16

## 2022-01-17 DIAGNOSIS — C189 Malignant neoplasm of colon, unspecified: Secondary | ICD-10-CM

## 2022-01-17 LAB — CBC WITH DIFFERENTIAL (CANCER CENTER ONLY)
Abs Immature Granulocytes: 0.03 10*3/uL (ref 0.00–0.07)
Basophils Absolute: 0 10*3/uL (ref 0.0–0.1)
Basophils Relative: 1 %
Eosinophils Absolute: 0.1 10*3/uL (ref 0.0–0.5)
Eosinophils Relative: 3 %
HCT: 36.5 % (ref 36.0–46.0)
Hemoglobin: 11.4 g/dL — ABNORMAL LOW (ref 12.0–15.0)
Immature Granulocytes: 1 %
Lymphocytes Relative: 24 %
Lymphs Abs: 1.3 10*3/uL (ref 0.7–4.0)
MCH: 27.3 pg (ref 26.0–34.0)
MCHC: 31.2 g/dL (ref 30.0–36.0)
MCV: 87.3 fL (ref 80.0–100.0)
Monocytes Absolute: 0.6 10*3/uL (ref 0.1–1.0)
Monocytes Relative: 10 %
Neutro Abs: 3.5 10*3/uL (ref 1.7–7.7)
Neutrophils Relative %: 61 %
Platelet Count: 186 10*3/uL (ref 150–400)
RBC: 4.18 MIL/uL (ref 3.87–5.11)
RDW: 20.1 % — ABNORMAL HIGH (ref 11.5–15.5)
WBC Count: 5.6 10*3/uL (ref 4.0–10.5)
nRBC: 0 % (ref 0.0–0.2)

## 2022-01-17 LAB — CMP (CANCER CENTER ONLY)
ALT: 12 U/L (ref 0–44)
AST: 16 U/L (ref 15–41)
Albumin: 4 g/dL (ref 3.5–5.0)
Alkaline Phosphatase: 115 U/L (ref 38–126)
Anion gap: 7 (ref 5–15)
BUN: 10 mg/dL (ref 6–20)
CO2: 27 mmol/L (ref 22–32)
Calcium: 9.4 mg/dL (ref 8.9–10.3)
Chloride: 106 mmol/L (ref 98–111)
Creatinine: 0.56 mg/dL (ref 0.44–1.00)
GFR, Estimated: >60 mL/min (ref >60)
Glucose, Bld: 87 mg/dL (ref 70–99)
Potassium: 3.9 mmol/L (ref 3.5–5.1)
Sodium: 140 mmol/L (ref 135–145)
Total Bilirubin: 0.2 mg/dL — ABNORMAL LOW (ref 0.3–1.2)
Total Protein: 7.2 g/dL (ref 6.5–8.1)

## 2022-01-17 LAB — TOTAL PROTEIN, URINE DIPSTICK: Protein, ur: NEGATIVE mg/dL

## 2022-01-17 MED ORDER — PALONOSETRON HCL INJECTION 0.25 MG/5ML
0.2500 mg | Freq: Once | INTRAVENOUS | Status: AC
  Administered 2022-01-17: 0.25 mg via INTRAVENOUS
  Filled 2022-01-17: qty 5

## 2022-01-17 MED ORDER — HEPARIN SOD (PORK) LOCK FLUSH 100 UNIT/ML IV SOLN: 500.0000 [IU] | Freq: Once | INTRAVENOUS | Status: DC | PRN

## 2022-01-17 MED ORDER — SODIUM CHLORIDE 0.9 % IV SOLN
2400.0000 mg/m2 | INTRAVENOUS | Status: AC
  Administered 2022-01-17: 4550 mg via INTRAVENOUS
  Filled 2022-01-17: qty 91

## 2022-01-17 MED ORDER — SODIUM CHLORIDE 0.9 % IV SOLN
150.0000 mg | Freq: Once | INTRAVENOUS | Status: AC
  Administered 2022-01-17: 150 mg via INTRAVENOUS
  Filled 2022-01-17: qty 150

## 2022-01-17 MED ORDER — DEXTROSE 5 % IV SOLN: Freq: Once | INTRAVENOUS | Status: AC

## 2022-01-17 MED ORDER — SODIUM CHLORIDE 0.9 % IV SOLN
10.0000 mg | Freq: Once | INTRAVENOUS | Status: AC
  Administered 2022-01-17: 10 mg via INTRAVENOUS
  Filled 2022-01-17: qty 10

## 2022-01-17 MED ORDER — SODIUM CHLORIDE 0.9 % IV SOLN
148.5000 mg/m2 | Freq: Once | INTRAVENOUS | Status: AC
  Administered 2022-01-17: 280 mg via INTRAVENOUS
  Filled 2022-01-17: qty 10

## 2022-01-17 MED ORDER — SODIUM CHLORIDE 0.9 % IV SOLN: Freq: Once | INTRAVENOUS | Status: AC

## 2022-01-17 MED ORDER — ATROPINE SULFATE 1 MG/ML IV SOLN: 0.5000 mg | Freq: Once | INTRAVENOUS | Status: DC | PRN

## 2022-01-17 MED ORDER — SODIUM CHLORIDE 0.9% FLUSH: 10.0000 mL | INTRAVENOUS | Status: DC | PRN

## 2022-01-17 MED ORDER — SODIUM CHLORIDE 0.9 % IV SOLN
400.0000 mg/m2 | Freq: Once | INTRAVENOUS | Status: AC
  Administered 2022-01-17: 756 mg via INTRAVENOUS
  Filled 2022-01-17: qty 37.8

## 2022-01-17 MED ORDER — DEXTROSE 5 % IV SOLN: Freq: Once | INTRAVENOUS | Status: DC

## 2022-01-17 NOTE — Progress Notes (Signed)
During chemo infusion appointment, patient received a call from Dr [name] at [facility].  After Tumor Board, Dr [name] wants to do surgery Sept 20th, Continue with treatment today, No Avastin today per Dr [name].  Dr [name] made aware of these changes.  Ok to treat today based on 01/10/2022 labwork per Dr [name]

## 2022-01-17 NOTE — Patient Instructions (Signed)

## 2022-01-17 NOTE — Patient Instructions (Signed)
[facility] [place]  Discharge Instructions: Thank you for choosing [facility] to provide your oncology and hematology care.   If you have a lab appointment with the [facility], please go directly to the [facility] and check in at the registration area.  Wear comfortable clothing and clothing appropriate for easy access to any Portacath or PICC line.   We strive to give you quality time with your provider. You may need to reschedule your appointment if you arrive late (15 or more minutes).  Arriving late affects you and other patients whose appointments are after yours.  Also, if you miss three or more appointments without notifying the office, you may be dismissed from the clinic at the provider's discretion.      For prescription refill requests, have your pharmacy contact our office and allow 72 hours for refills to be completed.    Today you received the following chemotherapy and/or immunotherapy agents 5FU, Leucovorin, Camptosar, Irinotecan      To help prevent nausea and vomiting after your treatment, we encourage you to take your nausea medication as directed.  BELOW ARE SYMPTOMS THAT SHOULD BE REPORTED IMMEDIATELY: *FEVER GREATER THAN 100.4 F (38 C) OR HIGHER *CHILLS OR SWEATING *NAUSEA AND VOMITING THAT IS NOT CONTROLLED WITH YOUR NAUSEA MEDICATION *UNUSUAL SHORTNESS OF BREATH *UNUSUAL BRUISING OR BLEEDING *URINARY PROBLEMS (pain or burning when urinating, or frequent urination) *BOWEL PROBLEMS (unusual diarrhea, constipation, pain near the anus) TENDERNESS IN MOUTH AND THROAT WITH OR WITHOUT PRESENCE OF ULCERS (sore throat, sores in mouth, or a toothache) UNUSUAL RASH, SWELLING OR PAIN  UNUSUAL VAGINAL DISCHARGE OR ITCHING   Items with * indicate a potential emergency and should be followed up as soon as possible or go to the Emergency Department if any problems should occur.  Please show the CHEMOTHERAPY ALERT CARD or IMMUNOTHERAPY  ALERT CARD at check-in to the Emergency Department and triage nurse. Should you have questions after your visit or need to cancel or reschedule your appointment, please contact [facility]  [phone] and follow the prompts.  Office hours are 8:00 a.m. to 4:30 p.m. Monday - Friday. Please note that voicemails left after 4:00 p.m. may not be returned until the following business day.  We are closed weekends and major holidays. You have access to a nurse at all times for urgent questions. Please call the main number to the clinic [phone] and follow the prompts.  For any non-urgent questions, you may also contact your provider using MyChart. We now offer e-Visits for anyone 75 and older to request care online for non-urgent symptoms. For details visit [email or web address].   Also download the MyChart app! Go to the app store, search "MyChart", open the app, select [facility], and log in with your MyChart username and password.  Masks are optional in the cancer centers. If you would like for your care team to wear a mask while they are taking care of you, please let them know. You may have one support person who is at least 54 years old accompany you for your appointments.

## 2022-01-17 NOTE — Progress Notes (Signed)
Patient's surgery cancelled per Dr [name], however after reviewing her case with tumor board, he has decided that surgery can be done. She is now scheduled for 02/14/22. She will receive treatment today.  Oncology Nurse Navigator Documentation     01/17/2022    9:45 AM  Oncology Nurse Navigator Flowsheets  Navigator Follow Up Date: 02/14/2022  Navigator Follow Up Reason: Surgery  Navigator Location [facility]  Navigator Encounter Type Treatment  Patient Visit Type MedOnc  Treatment Phase Active Tx  Barriers/Navigation Needs Coordination of Care;Education  Interventions Psycho-Social Support  Acuity Level 2-Minimal Needs (1-2 Barriers Identified)  Support Groups/Services Friends and Family  Time Spent with Patient 15

## 2022-01-17 NOTE — Progress Notes (Signed)
No Oxaliplatin today per MD. Increase Irinotecan to '280mg'$  (149 mg/m2) and decrease 5FU pump to 4550 mg (2400 mg/m2)  Acquanetta Belling, RPH, BCPS, BCOP 01/17/2022 9:54 AM

## 2022-01-19 ENCOUNTER — Inpatient Hospital Stay: Payer: Medicaid Other

## 2022-01-19 VITALS — BP 117/66 | HR 70 | Temp 97.6°F | Resp 17

## 2022-01-19 DIAGNOSIS — C189 Malignant neoplasm of colon, unspecified: Secondary | ICD-10-CM

## 2022-01-19 MED ORDER — PEGFILGRASTIM-CBQV 6 MG/0.6ML ~~LOC~~ SOSY: 6.0000 mg | PREFILLED_SYRINGE | Freq: Once | SUBCUTANEOUS | Status: DC

## 2022-01-19 MED ORDER — HEPARIN SOD (PORK) LOCK FLUSH 100 UNIT/ML IV SOLN
500.0000 [IU] | Freq: Once | INTRAVENOUS | Status: AC | PRN
  Administered 2022-01-19: 500 [IU]

## 2022-01-19 MED ORDER — SODIUM CHLORIDE 0.9% FLUSH
10.0000 mL | INTRAVENOUS | Status: DC | PRN
  Administered 2022-01-19: 10 mL

## 2022-01-19 NOTE — Patient Instructions (Signed)

## 2022-01-25 ENCOUNTER — Telehealth: Payer: Self-pay | Admitting: *Deleted

## 2022-01-25 NOTE — Telephone Encounter (Signed)
Returned patient's phone call regarding stomach pain. She stated,"I have a pain in my upper stomach that started Monday. It happens when I eat." I asked her if the Norco was helping with the pain. She stated,"I'm not taking anything for pain because I didn't want to get constipated." I instructed her to take Tylenol or Norco if the pain gets worse. She verbalized understanding.

## 2022-01-31 ENCOUNTER — Inpatient Hospital Stay: Payer: Self-pay

## 2022-01-31 ENCOUNTER — Other Ambulatory Visit: Payer: Self-pay

## 2022-01-31 ENCOUNTER — Encounter: Payer: Self-pay | Admitting: Hematology & Oncology

## 2022-01-31 ENCOUNTER — Inpatient Hospital Stay: Payer: Medicaid Other | Attending: Hematology & Oncology | Admitting: Hematology & Oncology

## 2022-01-31 DIAGNOSIS — C787 Secondary malignant neoplasm of liver and intrahepatic bile duct: Secondary | ICD-10-CM | POA: Insufficient documentation

## 2022-01-31 DIAGNOSIS — Z95828 Presence of other vascular implants and grafts: Secondary | ICD-10-CM

## 2022-01-31 DIAGNOSIS — C189 Malignant neoplasm of colon, unspecified: Secondary | ICD-10-CM

## 2022-01-31 DIAGNOSIS — Z452 Encounter for adjustment and management of vascular access device: Secondary | ICD-10-CM | POA: Insufficient documentation

## 2022-01-31 DIAGNOSIS — C182 Malignant neoplasm of ascending colon: Secondary | ICD-10-CM | POA: Insufficient documentation

## 2022-01-31 LAB — CBC WITH DIFFERENTIAL (CANCER CENTER ONLY)
Abs Immature Granulocytes: 0.01 K/uL (ref 0.00–0.07)
Basophils Absolute: 0 K/uL (ref 0.0–0.1)
Basophils Relative: 0 %
Eosinophils Absolute: 0.3 K/uL (ref 0.0–0.5)
Eosinophils Relative: 7 %
HCT: 36.2 % (ref 36.0–46.0)
Hemoglobin: 11.5 g/dL — ABNORMAL LOW (ref 12.0–15.0)
Immature Granulocytes: 0 %
Lymphocytes Relative: 24 %
Lymphs Abs: 1.1 K/uL (ref 0.7–4.0)
MCH: 27.9 pg (ref 26.0–34.0)
MCHC: 31.8 g/dL (ref 30.0–36.0)
MCV: 87.9 fL (ref 80.0–100.0)
Monocytes Absolute: 0.4 K/uL (ref 0.1–1.0)
Monocytes Relative: 9 %
Neutro Abs: 2.5 K/uL (ref 1.7–7.7)
Neutrophils Relative %: 60 %
Platelet Count: 159 K/uL (ref 150–400)
RBC: 4.12 MIL/uL (ref 3.87–5.11)
RDW: 19.1 % — ABNORMAL HIGH (ref 11.5–15.5)
WBC Count: 4.3 K/uL (ref 4.0–10.5)
nRBC: 0 % (ref 0.0–0.2)

## 2022-01-31 LAB — CMP (CANCER CENTER ONLY)
ALT: 9 U/L (ref 0–44)
AST: 14 U/L — ABNORMAL LOW (ref 15–41)
Albumin: 3.9 g/dL (ref 3.5–5.0)
Alkaline Phosphatase: 107 U/L (ref 38–126)
Anion gap: 6 (ref 5–15)
BUN: 6 mg/dL (ref 6–20)
CO2: 28 mmol/L (ref 22–32)
Calcium: 9.4 mg/dL (ref 8.9–10.3)
Chloride: 105 mmol/L (ref 98–111)
Creatinine: 0.55 mg/dL (ref 0.44–1.00)
GFR, Estimated: >60 mL/min (ref >60)
Glucose, Bld: 94 mg/dL (ref 70–99)
Potassium: 3.9 mmol/L (ref 3.5–5.1)
Sodium: 139 mmol/L (ref 135–145)
Total Bilirubin: 0.3 mg/dL (ref 0.3–1.2)
Total Protein: 6.8 g/dL (ref 6.5–8.1)

## 2022-01-31 LAB — IRON AND IRON BINDING CAPACITY (CC-WL,HP ONLY)
Iron: 40 ug/dL (ref 28–170)
Saturation Ratios: 12 % (ref 10.4–31.8)
TIBC: 346 ug/dL (ref 250–450)
UIBC: 306 ug/dL (ref 148–442)

## 2022-01-31 LAB — FERRITIN: Ferritin: 100 ng/mL (ref 11–307)

## 2022-01-31 LAB — RETICULOCYTES
Immature Retic Fract: 8.5 % (ref 2.3–15.9)
RBC.: 4.13 MIL/uL (ref 3.87–5.11)
Retic Count, Absolute: 72.7 10*3/uL (ref 19.0–186.0)
Retic Ct Pct: 1.8 % (ref 0.4–3.1)

## 2022-01-31 LAB — CEA (IN HOUSE-CHCC): CEA (CHCC-In House): 9.15 ng/mL — ABNORMAL HIGH (ref 0.00–5.00)

## 2022-01-31 MED ORDER — HEPARIN SOD (PORK) LOCK FLUSH 100 UNIT/ML IV SOLN
500.0000 [IU] | Freq: Once | INTRAVENOUS | Status: AC
  Administered 2022-01-31: 500 [IU] via INTRAVENOUS

## 2022-01-31 MED ORDER — SODIUM CHLORIDE 0.9% FLUSH
10.0000 mL | Freq: Once | INTRAVENOUS | Status: AC
  Administered 2022-01-31: 10 mL via INTRAVENOUS

## 2022-01-31 NOTE — Progress Notes (Signed)
Hematology and Oncology Follow Up Visit  Rebecca Bullock [id number] 1965-08-10 56 y.o. 01/31/2022   Principle Diagnosis:  Metastatic colorectal cancer-liver metastasis- BRAF (+)   Current Therapy:        Status post cycle 1 of chemotherapy with FOLFOXIRI/Avastin Encorafenib/Vectibix -- started on 06/19/2021, s/p cycle #6 -- d/c on 10/31/2021 FOLFOXIRI -- s/p cycle  #4-- start on 11/08/2021,  IV iron-Ferrlecit given on 01/12/2022               Interim History:  Ms. Dolata is here today for follow-up.  Surgery has been delayed until September 20.  As such, we will not treat her today.  Her blood counts are looking pretty good.  She feels pretty good.  She is having some abdominal discomfort.  I told her to try some Prilosec with bicarbonate.  This may help.  She has had a little bit of diarrhea.  Again, I suspect this probably is from the chemotherapy.  She has had no vomiting.  She is eating okay.  Her last CEA level was 7.9.  This is holding pretty stable.  She has had no bleeding.  There is been no leg swelling.  She has had no rashes.  We did go ahead and give her IV iron.  She last got iron back in August.  She has had no fever.  There has been no headache.  She has had no mouth sores.  Overall, I would say performance status is probably ECOG 1.  Medications:  Allergies as of 01/31/2022       Reactions   Augmentin [amoxicillin-pot Clavulanate] Diarrhea   Extreme diarrhea, abdominal pain.   Irinotecan Other (See Comments)   Flushing, tingling, visual disturbance   Metronidazole Diarrhea   Vancomycin Rash   ?red syndrome        Medication List        Accurate as of January 31, 2022  8:58 AM. If you have any questions, ask your nurse or doctor.          Acetaminophen 325 MG Caps Take 650 mg by mouth every 8 (eight) hours as needed for moderate pain or headache.   betamethasone valerate 0.1 % cream Commonly known as: VALISONE Apply 1 application  topically See  admin instructions. Apply 1 application topically every other day as needed for psoriasis flare   dexamethasone 4 MG tablet Commonly known as: DECADRON Take 2 tablets (8 mg total) by mouth daily. Start the day after chemotherapy for 3 days. Take with food.   diphenoxylate-atropine 2.5-0.025 MG tablet Commonly known as: LOMOTIL Take 2 tablets by mouth 4 (four) times daily as needed for diarrhea or loose stools.   encorafenib 75 MG capsule Commonly known as: BRAFTOVI Take 4 capsules (300 mg total) by mouth daily.   estradiol 0.1 MG/GM vaginal cream Commonly known as: ESTRACE Place 1 Applicatorful vaginally daily as needed (dryness / irritation).   fluticasone 50 MCG/ACT nasal spray Commonly known as: FLONASE Place 2 sprays into both nostrils daily.   HYDROcodone-acetaminophen 5-325 MG tablet Commonly known as: NORCO/VICODIN Take 1 tablet by mouth every 4 (four) hours as needed for moderate pain.   hydrocortisone 1 % lotion Apply 1 application topically 2 (two) times daily as needed for itching.   LORazepam 0.5 MG tablet Commonly known as: ATIVAN Take 1 tablet (0.5 mg total) by mouth every 6 (six) hours as needed for anxiety.   omeprazole 20 MG tablet Commonly known as: PRILOSEC OTC Take 20 mg by mouth  daily as needed (acid reflux).   ondansetron 8 MG tablet Commonly known as: Zofran Take 1 tablet (8 mg total) by mouth 2 (two) times daily as needed. Start on day 3 after chemotherapy.   prochlorperazine 10 MG tablet Commonly known as: COMPAZINE Take 1 tablet (10 mg total) by mouth every 6 (six) hours as needed (Nausea or vomiting).        Allergies:  Allergies  Allergen Reactions   Augmentin [Amoxicillin-Pot Clavulanate] Diarrhea    Extreme diarrhea, abdominal pain.   Irinotecan Other (See Comments)    Flushing, tingling, visual disturbance   Metronidazole Diarrhea   Vancomycin Rash    ?red syndrome    Past Medical History, Surgical history, Social history,  and Family History were reviewed and updated.  Review of Systems: Review of Systems  Constitutional: Negative.   HENT: Negative.    Eyes: Negative.   Respiratory: Negative.    Cardiovascular: Negative.   Gastrointestinal: Negative.   Genitourinary: Negative.   Musculoskeletal: Negative.   Skin: Negative.   Neurological: Negative.   Endo/Heme/Allergies: Negative.   Psychiatric/Behavioral: Negative.       Physical Exam:  height is '5\' 1"'  (1.549 m) and weight is 181 lb (82.1 kg). Her oral temperature is 97.8 F (36.6 C). Her blood pressure is 119/72 and her pulse is 70. Her respiration is 18 and oxygen saturation is 100%.   Wt Readings from Last 3 Encounters:  01/31/22 181 lb (82.1 kg)  01/12/22 182 lb (82.6 kg)  01/10/22 182 lb (82.6 kg)    Physical Exam Vitals reviewed.  HENT:     Head: Normocephalic and atraumatic.  Eyes:     Pupils: Pupils are equal, round, and reactive to light.  Cardiovascular:     Rate and Rhythm: Normal rate and regular rhythm.     Heart sounds: Normal heart sounds.  Pulmonary:     Effort: Pulmonary effort is normal.     Breath sounds: Normal breath sounds.  Abdominal:     General: Bowel sounds are normal.     Palpations: Abdomen is soft.  Musculoskeletal:        General: No tenderness or deformity. Normal range of motion.     Cervical back: Normal range of motion.  Lymphadenopathy:     Cervical: No cervical adenopathy.  Skin:    General: Skin is warm and dry.     Findings: No erythema or rash.  Neurological:     Mental Status: She is alert and oriented to person, place, and time.  Psychiatric:        Behavior: Behavior normal.        Thought Content: Thought content normal.        Judgment: Judgment normal.      Lab Results  Component Value Date   WBC 4.3 01/31/2022   HGB 11.5 (L) 01/31/2022   HCT 36.2 01/31/2022   MCV 87.9 01/31/2022   PLT 159 01/31/2022   Lab Results  Component Value Date   FERRITIN 87 01/10/2022   IRON  54 01/10/2022   TIBC 377 01/10/2022   UIBC 323 01/10/2022   IRONPCTSAT 14 01/10/2022   Lab Results  Component Value Date   RETICCTPCT 1.8 01/31/2022   RBC 4.13 01/31/2022   No results found for: "KPAFRELGTCHN", "LAMBDASER", "KAPLAMBRATIO" No results found for: "IGGSERUM", "IGA", "IGMSERUM" No results found for: "TOTALPROTELP", "ALBUMINELP", "A1GS", "A2GS", "BETS", "BETA2SER", "GAMS", "MSPIKE", "SPEI"   Chemistry      Component Value Date/Time   NA 140  01/17/2022 0855   K 3.9 01/17/2022 0855   CL 106 01/17/2022 0855   CO2 27 01/17/2022 0855   BUN 10 01/17/2022 0855   CREATININE 0.56 01/17/2022 0855   CREATININE 0.56 03/23/2021 0000      Component Value Date/Time   CALCIUM 9.4 01/17/2022 0855   ALKPHOS 115 01/17/2022 0855   AST 16 01/17/2022 0855   ALT 12 01/17/2022 0855   BILITOT 0.2 (L) 01/17/2022 0855       Impression and Plan: Ms. Ferrufino is a very pleasant 57 yo caucasian female with metastatic colon cancer.  Again, her disease has been confined to her liver.  She has had a very nice response to treatment.  She will go for surgery in 2 weeks.  I know that she will do well with surgery.  I will be very interested to see what the outcome is after surgery.  Hopefully, she will not have any obvious active disease.  I still would suspect that she might need treatment after surgery.  She may get intrahepatic therapy out of [facility].  I am just glad that she finally got to have surgery.  We will plan to get her back to see [place] probably in November.  I suspect she probably will be in the hospital for about a week or so.    [name], MD 9/6/20238:58 AM

## 2022-02-01 ENCOUNTER — Other Ambulatory Visit: Payer: Self-pay

## 2022-02-02 ENCOUNTER — Inpatient Hospital Stay: Payer: Self-pay

## 2022-02-05 ENCOUNTER — Encounter: Payer: Self-pay | Admitting: Hematology & Oncology

## 2022-02-05 ENCOUNTER — Encounter: Payer: Self-pay | Admitting: Family

## 2022-02-06 ENCOUNTER — Other Ambulatory Visit (HOSPITAL_BASED_OUTPATIENT_CLINIC_OR_DEPARTMENT_OTHER): Payer: Self-pay

## 2022-02-06 ENCOUNTER — Encounter: Payer: Self-pay | Admitting: Family

## 2022-02-06 ENCOUNTER — Encounter: Payer: Self-pay | Admitting: Hematology & Oncology

## 2022-02-06 MED ORDER — ERYTHROMYCIN BASE 500 MG PO TABS
ORAL_TABLET | ORAL | 0 refills | Status: DC
  Filled 2022-02-06: qty 6, 1d supply, fill #0

## 2022-02-06 MED ORDER — NEOMYCIN SULFATE 500 MG PO TABS
ORAL_TABLET | ORAL | 0 refills | Status: DC
  Filled 2022-02-06: qty 6, 1d supply, fill #0

## 2022-02-07 ENCOUNTER — Other Ambulatory Visit (HOSPITAL_BASED_OUTPATIENT_CLINIC_OR_DEPARTMENT_OTHER): Payer: Self-pay

## 2022-02-07 ENCOUNTER — Encounter: Payer: Self-pay | Admitting: Family

## 2022-02-07 ENCOUNTER — Encounter: Payer: Self-pay | Admitting: Hematology & Oncology

## 2022-02-08 ENCOUNTER — Other Ambulatory Visit (HOSPITAL_BASED_OUTPATIENT_CLINIC_OR_DEPARTMENT_OTHER): Payer: Self-pay

## 2022-02-09 ENCOUNTER — Other Ambulatory Visit (HOSPITAL_BASED_OUTPATIENT_CLINIC_OR_DEPARTMENT_OTHER): Payer: Self-pay

## 2022-02-15 ENCOUNTER — Encounter: Payer: Self-pay | Admitting: Hematology & Oncology

## 2022-02-15 ENCOUNTER — Inpatient Hospital Stay: Payer: Self-pay

## 2022-02-15 ENCOUNTER — Other Ambulatory Visit: Payer: Self-pay

## 2022-02-15 ENCOUNTER — Inpatient Hospital Stay (HOSPITAL_BASED_OUTPATIENT_CLINIC_OR_DEPARTMENT_OTHER): Payer: Self-pay | Admitting: Hematology & Oncology

## 2022-02-15 ENCOUNTER — Ambulatory Visit
Admission: RE | Admit: 2022-02-15 | Discharge: 2022-02-15 | Disposition: A | Discharge status: Home / Self Care | Payer: Medicaid Other | Source: Ambulatory Visit | Attending: Hematology & Oncology | Admitting: Hematology & Oncology

## 2022-02-15 ENCOUNTER — Other Ambulatory Visit: Payer: Self-pay | Admitting: Hematology & Oncology

## 2022-02-15 VITALS — BP 111/67 | HR 92 | Temp 97.8°F | Resp 16 | Ht 61.0 in | Wt 184.0 lb

## 2022-02-15 DIAGNOSIS — C189 Malignant neoplasm of colon, unspecified: Secondary | ICD-10-CM

## 2022-02-15 DIAGNOSIS — Z95828 Presence of other vascular implants and grafts: Secondary | ICD-10-CM

## 2022-02-15 DIAGNOSIS — C787 Secondary malignant neoplasm of liver and intrahepatic bile duct: Secondary | ICD-10-CM

## 2022-02-15 HISTORY — PX: IR RADIOLOGIST EVAL & MGMT: IMG5224

## 2022-02-15 LAB — CBC WITH DIFFERENTIAL (CANCER CENTER ONLY)
Abs Immature Granulocytes: 0.08 10*3/uL — ABNORMAL HIGH (ref 0.00–0.07)
Basophils Absolute: 0 10*3/uL (ref 0.0–0.1)
Basophils Relative: 1 %
Eosinophils Absolute: 0.4 10*3/uL (ref 0.0–0.5)
Eosinophils Relative: 5 %
HCT: 36.2 % (ref 36.0–46.0)
Hemoglobin: 11.4 g/dL — ABNORMAL LOW (ref 12.0–15.0)
Immature Granulocytes: 1 %
Lymphocytes Relative: 20 %
Lymphs Abs: 1.3 10*3/uL (ref 0.7–4.0)
MCH: 27.9 pg (ref 26.0–34.0)
MCHC: 31.5 g/dL (ref 30.0–36.0)
MCV: 88.5 fL (ref 80.0–100.0)
Monocytes Absolute: 0.5 10*3/uL (ref 0.1–1.0)
Monocytes Relative: 7 %
Neutro Abs: 4.2 10*3/uL (ref 1.7–7.7)
Neutrophils Relative %: 66 %
Platelet Count: 200 10*3/uL (ref 150–400)
RBC: 4.09 MIL/uL (ref 3.87–5.11)
RDW: 17.3 % — ABNORMAL HIGH (ref 11.5–15.5)
WBC Count: 6.4 10*3/uL (ref 4.0–10.5)
nRBC: 0 % (ref 0.0–0.2)

## 2022-02-15 LAB — CMP (CANCER CENTER ONLY)
ALT: 15 U/L (ref 0–44)
AST: 30 U/L (ref 15–41)
Albumin: 3.3 g/dL — ABNORMAL LOW (ref 3.5–5.0)
Alkaline Phosphatase: 144 U/L — ABNORMAL HIGH (ref 38–126)
Anion gap: 8 (ref 5–15)
BUN: 8 mg/dL (ref 6–20)
CO2: 25 mmol/L (ref 22–32)
Calcium: 8.7 mg/dL — ABNORMAL LOW (ref 8.9–10.3)
Chloride: 105 mmol/L (ref 98–111)
Creatinine: 0.63 mg/dL (ref 0.44–1.00)
GFR, Estimated: >60 mL/min (ref >60)
Glucose, Bld: 147 mg/dL — ABNORMAL HIGH (ref 70–99)
Potassium: 3.6 mmol/L (ref 3.5–5.1)
Sodium: 138 mmol/L (ref 135–145)
Total Bilirubin: 0.2 mg/dL — ABNORMAL LOW (ref 0.3–1.2)
Total Protein: 7.3 g/dL (ref 6.5–8.1)

## 2022-02-15 LAB — IRON AND IRON BINDING CAPACITY (CC-WL,HP ONLY)
Iron: 37 ug/dL (ref 28–170)
Saturation Ratios: 11 % (ref 10.4–31.8)
TIBC: 342 ug/dL (ref 250–450)
UIBC: 305 ug/dL (ref 148–442)

## 2022-02-15 LAB — FERRITIN: Ferritin: 90 ng/mL (ref 11–307)

## 2022-02-15 LAB — RETICULOCYTES
Immature Retic Fract: 13.4 % (ref 2.3–15.9)
RBC.: 4.11 MIL/uL (ref 3.87–5.11)
Retic Count, Absolute: 58 10*3/uL (ref 19.0–186.0)
Retic Ct Pct: 1.4 % (ref 0.4–3.1)

## 2022-02-15 LAB — CEA (IN HOUSE-CHCC): CEA (CHCC-In House): 11.06 ng/mL — ABNORMAL HIGH (ref 0.00–5.00)

## 2022-02-15 MED ORDER — SODIUM CHLORIDE 0.9% FLUSH
10.0000 mL | Freq: Once | INTRAVENOUS | Status: AC
  Administered 2022-02-15: 10 mL via INTRAVENOUS

## 2022-02-15 MED ORDER — HEPARIN SOD (PORK) LOCK FLUSH 100 UNIT/ML IV SOLN
500.0000 [IU] | Freq: Once | INTRAVENOUS | Status: AC
  Administered 2022-02-15: 500 [IU] via INTRAVENOUS

## 2022-02-15 NOTE — Patient Instructions (Signed)

## 2022-02-15 NOTE — Consult Note (Signed)
Chief Complaint: Patient was seen in consultation today for colon cancer metastatic to the liver  Referring Physician(s): Ennever,Peter R  History of Present Illness: Rebecca Bullock is a 56 y.o. female with history of BRAF + metastatic ascending colonic mass with metastasis to the liver.  She is status post FOLFOXIRI/Avastin, most recently just [facility] (s/p 10 cycles total since January 2023).    She presents today via virtual telephone clinic visit at the gracious referral of Dr. [name].   Rebecca Bullock developed a cough after covid in 2022 which was persistent.  Her PCP obtained labs which revealed severe anemia with Hb 5.2.  She was also experiencing some weight loss and hair loss.  A CT scan was done in November 2022 which revealed an ascending colon mass and a liver mass.  Colonoscopy soon after confirmed colon cancer.  She has remained totally asymptomatic.  No bloody stools.    Had planned to have colectomy +/- hepatectomy earlier this month at [facility], however was cancelled due to perceived enlargement of the liver mass and concern for lymphadenopathy.      Past Medical History:  Diagnosis Date   Colon cancer metastasized to liver [facility]) 05/05/2021   Family history of lung cancer    Family history of pancreatic cancer    Goals of care, counseling/discussion 05/05/2021    Past Surgical History:  Procedure Laterality Date   IR IMAGING GUIDED PORT INSERTION  05/11/2021   uterine ablation      Allergies: Augmentin [amoxicillin-pot clavulanate], Irinotecan, Metronidazole, Vancomycin, and Tape  Medications: Prior to Admission medications   Medication Sig Start Date End Date Taking? Authorizing Provider  Acetaminophen 325 MG CAPS Take 650 mg by mouth every 8 (eight) hours as needed for moderate pain or headache. Patient not taking: Reported on 01/10/2022    [provider]  betamethasone valerate (VALISONE) 0.1 % cream Apply 1 application  topically See admin  instructions. Apply 1 application topically every other day as needed for psoriasis flare Patient not taking: Reported on 01/10/2022 11/11/20   [provider]  dexamethasone (DECADRON) 4 MG tablet Take 2 tablets (8 mg total) by mouth daily. Start the day after chemotherapy for 3 days. Take with food. Patient not taking: Reported on 12/20/2021 11/06/21   [name], MD  diphenoxylate-atropine (LOMOTIL) 2.5-0.025 MG tablet Take 2 tablets by mouth 4 (four) times daily as needed for diarrhea or loose stools. Patient not taking: Reported on 01/10/2022 11/22/21   [name], MD  encorafenib (BRAFTOVI) 75 MG capsule Take 4 capsules (300 mg total) by mouth daily. Patient not taking: Reported on 12/20/2021 06/01/21   [name], MD  erythromycin base (E-MYCIN) 500 MG tablet Take two tablets by mouth at 8:00pm, 9:00pm and 11:00pm the night before surgery. 02/06/22     estradiol (ESTRACE) 0.1 MG/GM vaginal cream Place 1 Applicatorful vaginally daily as needed (dryness / irritation). Patient not taking: Reported on 01/10/2022 11/11/20   [provider]  fluticasone (FLONASE) 50 MCG/ACT nasal spray Place 2 sprays into both nostrils daily. Patient not taking: Reported on 01/10/2022 09/18/16   Emeterio Reeve, DO  HYDROcodone-acetaminophen (NORCO/VICODIN) 5-325 MG tablet Take 1 tablet by mouth every 4 (four) hours as needed for moderate pain. Patient not taking: Reported on 10/31/2021 08/23/21   [name], MD  hydrocortisone 1 % lotion Apply 1 application topically 2 (two) times daily as needed for itching. Patient not taking: Reported on 01/10/2022 06/26/21   [name], MD  LORazepam (ATIVAN) 0.5 MG tablet Take 1 tablet (0.5 mg total) by mouth every 6 (six) hours as needed for anxiety. Patient not taking: Reported on 01/10/2022 11/13/21   [name], MD  neomycin (MYCIFRADIN) 500 MG tablet Take two tablets by mouth at 8:00pm, 9:00pm and 11:00pm the night before surgery.  02/06/22     omeprazole (PRILOSEC OTC) 20 MG tablet Take 20 mg by mouth daily as needed (acid reflux). Patient not taking: Reported on 01/10/2022    [provider]  ondansetron (ZOFRAN) 8 MG tablet Take 1 tablet (8 mg total) by mouth 2 (two) times daily as needed. Start on day 3 after chemotherapy. 11/06/21   [name], MD  prochlorperazine (COMPAZINE) 10 MG tablet Take 1 tablet (10 mg total) by mouth every 6 (six) hours as needed (Nausea or vomiting). 11/06/21   [name], MD     Family History  Problem Relation Age of Onset   Lung cancer Mother    Diabetes Maternal Grandmother    Pancreatic cancer Maternal Grandfather    Multiple sclerosis Maternal Aunt    Alcohol abuse Maternal Uncle     Social History   Socioeconomic History   Marital status: Single    Spouse name: Not on file   Number of children: Not on file   Years of education: Not on file   Highest education level: Not on file  Occupational History   Not on file  Tobacco Use   Smoking status: Never   Smokeless tobacco: Never  Vaping Use   Vaping Use: Never used  Substance and Sexual Activity   Alcohol use: Yes    Comment: occasionally   Drug use: Not Currently   Sexual activity: Not on file  Other Topics Concern   Not on file  Social History Narrative   Not on file   Social Determinants of Health   Financial Resource Strain: Medium Risk (05/18/2021)   Overall Financial Resource Strain (CARDIA)    Difficulty of Paying Living Expenses: Somewhat hard  Food Insecurity: No Food Insecurity (05/18/2021)   Hunger Vital Sign    Worried About Running Out of Food in the Last Year: Never true    Ran Out of Food in the Last Year: Never true  Transportation Needs: No Transportation Needs (05/18/2021)   PRAPARE - Hydrologist (Medical): No    Lack of Transportation (Non-Medical): No  Physical Activity: Not on file  Stress: Not on file  Social Connections: Not on file     ECOG Status: 1 - Symptomatic but completely ambulatory  Review of Systems: A 12 point ROS discussed and pertinent positives are indicated in the HPI above.  All other systems are negative.   Vital Signs: There were no vitals taken for this visit.  No physical examination was performed in lieu of virtual telephone clinic visit.   Imaging: MRI abdomen 05/01/21   9.6 x 9.2 cm  MRI Abdomen 12/15/21   8.0 x 7.7 x 8.2 cm right hepatic mass, few punctate satellite lesions, prominent porta hepatis lymph node  Labs:  CBC: Recent Labs    01/10/22 0759 01/17/22 0855 01/31/22 0818 02/15/22 0840  WBC 3.8* 5.6 4.3 6.4  HGB 11.5* 11.4* 11.5* 11.4*  HCT 36.2 36.5 36.2 36.2  PLT 221 186 159 200    COAGS: Recent Labs    06/19/21 2331  INR 1.0  APTT 26    BMP: Recent Labs    01/10/22 0759  01/17/22 0855 01/31/22 0818 02/15/22 0840  NA 140 140 139 138  K 3.6 3.9 3.9 3.6  CL 105 106 105 105  CO2 _0 GLUCOSE 103* 87 94 147*  BUN _1 CALCIUM 9.4 9.4 9.4 8.7*  CREATININE 0.60 0.56 0.55 0.63  GFRNONAA >60 >60 >60 >60    LIVER FUNCTION TESTS: Recent Labs    01/10/22 0759 01/17/22 0855 01/31/22 0818 02/15/22 0840  BILITOT 0.2* 0.2* 0.3 0.2*  AST 19 16 14* 30  ALT _2 ALKPHOS 122 115 107 144*  PROT 7.1 7.2 6.8 7.3  ALBUMIN 4.0 4.0 3.9 3.3*    TUMOR MARKERS:   Assessment and Plan: 56 year old female with history of ascending colon cancer metastatic to the liver with an approximately 8 cm right hepatic dome mass.  She also has a porta hepatis lymph node.   She is status post chemotherapy since January 2023, now with rising CEA.  We discussed several locoregional therapies offered by interventional radiology.  Given the location of the tumor and size, I believe she would most benefit from transarterial radioembolization, segmentectomy approach.    We discussed risks and benefits including, but not limited to bleeding, infection, vascular  injury, post procedural pain, nausea, vomiting and fatigue, contrast induced renal failure, liver failure, radiation injury to the bowel, radiation induced cholecystitis, neutropenia and possible need for additional procedures.  All of the patient's questions were answered, patient is agreeable to proceed.  Plan for hepatic angiogram with Tc-MAA mapping study followed 2 weeks later by Y-90 administration at [facility].    Electronically Signed: Suzette Battiest, MD 02/15/2022, 2:31 PM   I spent a total of  60 Minutes  in virtual telephone in clinical consultation, greater than 50% of which was counseling/coordinating care for colon cancer metastatic to the liver.

## 2022-02-15 NOTE — Progress Notes (Signed)
Hematology and Oncology Follow Up Visit  Rebecca Bullock [id number] 06-09-1965 56 y.o. 02/15/2022   Principle Diagnosis:  Metastatic colorectal cancer-liver metastasis- BRAF (+)   Current Therapy:        Status post cycle 1 of chemotherapy with FOLFOXIRI/Avastin Encorafenib/Vectibix -- started on 06/19/2021, s/p cycle #6 -- d/c on 10/31/2021 FOLFOXIRI/Avastin-- start cycle  #1-- start on 02/19/2022,  IV iron-Ferrlecit given on 01/12/2022               Interim History:  Rebecca Bullock is here today for follow-up.  Unfortunately, she is not can have surgery now.  She had another CT scan done at [facility].  This showed that there was some progressive disease.  There was concern about the liver tumor in close to a hepatic vein.  She also seems to have some lymphadenopathy.  Her last CEA level was 9.4.  The surgeon spoke to me.  He wants her to have systemic therapy and then see if she responds.  I know this is incredibly difficult.  I hate the fact that she has not been able to have surgery.  It was seems to be some kind of delay that she has encountered.  I thought that we might be able to consider her for intrahepatic therapy to try to help with her liver disease.  I think this is very reasonable.  I spoke with Dr. Ruthann Cancer of Interventional Radiology.  He thinks that she might be a good candidate for Y-90 therapy.  He will get her in.    She feels well.  She is having no problems with pain.  There is no bleeding.  There is no change in bowel or bladder habits.  She has had no cough or shortness of breath.  Her first grandchild is going to be born in October.  Hopefully, she will be able to be in good health for this.  Her iron level today is a ferritin of 90 with an iron saturation of 11%.  Overall, I would say performance status is probably ECOG 0.     Medications:  Allergies as of 02/15/2022       Reactions   Augmentin [amoxicillin-pot Clavulanate] Diarrhea   Extreme diarrhea,  abdominal pain.   Irinotecan Other (See Comments)   Flushing, tingling, visual disturbance   Metronidazole Diarrhea   Vancomycin Rash   ?red syndrome        Medication List        Accurate as of February 15, 2022  8:41 AM. If you have any questions, ask your nurse or doctor.          Acetaminophen 325 MG Caps Take 650 mg by mouth every 8 (eight) hours as needed for moderate pain or headache.   betamethasone valerate 0.1 % cream Commonly known as: VALISONE Apply 1 application  topically See admin instructions. Apply 1 application topically every other day as needed for psoriasis flare   dexamethasone 4 MG tablet Commonly known as: DECADRON Take 2 tablets (8 mg total) by mouth daily. Start the day after chemotherapy for 3 days. Take with food.   diphenoxylate-atropine 2.5-0.025 MG tablet Commonly known as: LOMOTIL Take 2 tablets by mouth 4 (four) times daily as needed for diarrhea or loose stools.   encorafenib 75 MG capsule Commonly known as: BRAFTOVI Take 4 capsules (300 mg total) by mouth daily.   erythromycin base 500 MG tablet Commonly known as: E-MYCIN Take two tablets by mouth at 8:00pm, 9:00pm and 11:00pm the night  before surgery.   estradiol 0.1 MG/GM vaginal cream Commonly known as: ESTRACE Place 1 Applicatorful vaginally daily as needed (dryness / irritation).   fluticasone 50 MCG/ACT nasal spray Commonly known as: FLONASE Place 2 sprays into both nostrils daily.   HYDROcodone-acetaminophen 5-325 MG tablet Commonly known as: NORCO/VICODIN Take 1 tablet by mouth every 4 (four) hours as needed for moderate pain.   hydrocortisone 1 % lotion Apply 1 application topically 2 (two) times daily as needed for itching.   LORazepam 0.5 MG tablet Commonly known as: ATIVAN Take 1 tablet (0.5 mg total) by mouth every 6 (six) hours as needed for anxiety.   neomycin 500 MG tablet Commonly known as: MYCIFRADIN Take two tablets by mouth at 8:00pm, 9:00pm and  11:00pm the night before surgery.   omeprazole 20 MG tablet Commonly known as: PRILOSEC OTC Take 20 mg by mouth daily as needed (acid reflux).   ondansetron 8 MG tablet Commonly known as: Zofran Take 1 tablet (8 mg total) by mouth 2 (two) times daily as needed. Start on day 3 after chemotherapy.   prochlorperazine 10 MG tablet Commonly known as: COMPAZINE Take 1 tablet (10 mg total) by mouth every 6 (six) hours as needed (Nausea or vomiting).        Allergies:  Allergies  Allergen Reactions   Augmentin [Amoxicillin-Pot Clavulanate] Diarrhea    Extreme diarrhea, abdominal pain.   Irinotecan Other (See Comments)    Flushing, tingling, visual disturbance   Metronidazole Diarrhea   Vancomycin Rash    ?red syndrome    Past Medical History, Surgical history, Social history, and Family History were reviewed and updated.  Review of Systems: Review of Systems  Constitutional: Negative.   HENT: Negative.    Eyes: Negative.   Respiratory: Negative.    Cardiovascular: Negative.   Gastrointestinal: Negative.   Genitourinary: Negative.   Musculoskeletal: Negative.   Skin: Negative.   Neurological: Negative.   Endo/Heme/Allergies: Negative.   Psychiatric/Behavioral: Negative.       Physical Exam: Her vital signs show temperature of 97.8.  Pulse 92.  Blood pressure is 111/67.  Weight is 184 pounds.  Wt Readings from Last 3 Encounters:  01/31/22 181 lb (82.1 kg)  01/12/22 182 lb (82.6 kg)  01/10/22 182 lb (82.6 kg)    Physical Exam Vitals reviewed.  HENT:     Head: Normocephalic and atraumatic.  Eyes:     Pupils: Pupils are equal, round, and reactive to light.  Cardiovascular:     Rate and Rhythm: Normal rate and regular rhythm.     Heart sounds: Normal heart sounds.  Pulmonary:     Effort: Pulmonary effort is normal.     Breath sounds: Normal breath sounds.  Abdominal:     General: Bowel sounds are normal.     Palpations: Abdomen is soft.  Musculoskeletal:         General: No tenderness or deformity. Normal range of motion.     Cervical back: Normal range of motion.  Lymphadenopathy:     Cervical: No cervical adenopathy.  Skin:    General: Skin is warm and dry.     Findings: No erythema or rash.  Neurological:     Mental Status: She is alert and oriented to person, place, and time.  Psychiatric:        Behavior: Behavior normal.        Thought Content: Thought content normal.        Judgment: Judgment normal.     Lab  Results  Component Value Date   WBC 4.3 01/31/2022   HGB 11.5 (L) 01/31/2022   HCT 36.2 01/31/2022   MCV 87.9 01/31/2022   PLT 159 01/31/2022   Lab Results  Component Value Date   FERRITIN 100 01/31/2022   IRON 40 01/31/2022   TIBC 346 01/31/2022   UIBC 306 01/31/2022   IRONPCTSAT 12 01/31/2022   Lab Results  Component Value Date   RETICCTPCT 1.8 01/31/2022   RBC 4.13 01/31/2022   No results found for: "KPAFRELGTCHN", "LAMBDASER", "KAPLAMBRATIO" No results found for: "IGGSERUM", "IGA", "IGMSERUM" No results found for: "TOTALPROTELP", "ALBUMINELP", "A1GS", "A2GS", "BETS", "BETA2SER", "GAMS", "MSPIKE", "SPEI"   Chemistry      Component Value Date/Time   NA 139 01/31/2022 0818   K 3.9 01/31/2022 0818   CL 105 01/31/2022 0818   CO2 28 01/31/2022 0818   BUN 6 01/31/2022 0818   CREATININE 0.55 01/31/2022 0818   CREATININE 0.56 03/23/2021 0000      Component Value Date/Time   CALCIUM 9.4 01/31/2022 0818   ALKPHOS 107 01/31/2022 0818   AST 14 (L) 01/31/2022 0818   ALT 9 01/31/2022 0818   BILITOT 0.3 01/31/2022 0818       Impression and Plan: Ms. Majette is a very charming  56 yo caucasian female with metastatic colon cancer.  Again, her disease has been confined to her liver.    Again, I really hate the fact that she has not been able to have surgery.  I still would like to hope that she will be able to have surgery as I think this is going to be the best way to try to cure her.  I think it is fantastic  that she would be able to have intrahepatic therapy.  I think the combination of this along with systemic therapy can hopefully get her back into a good remission so that she can consider surgery.  We will make sure that we have Avastin along with the FOLFOXIRI.  I think the Avastin will certainly help.  We will try to get treatment started systemically next week.  Again, the CEA will let us know how everything is going.  This is always been a very accurate marker for her disease response to treatment.    I will plan to see her back in 2 weeks or so when she has her second cycle of the FOLFOXIRI/Avastin.    [name], MD 9/21/20238:41 AM

## 2022-02-15 NOTE — Addendum Note (Signed)
Addended by: Shelda Altes on: 02/15/2022 09:15 AM   Modules accepted: Orders

## 2022-02-16 ENCOUNTER — Encounter: Payer: Self-pay | Admitting: *Deleted

## 2022-02-16 NOTE — Progress Notes (Signed)
Patient's surgery is cancelled due to progression. She will resume systemic therapy. She is also consulting with IR for consideration of Y-90. She was seen on 02/15/2022 for Y-90 planning.   Will follow for scheduling.   Oncology Nurse Navigator Documentation     02/16/2022    8:45 AM  Oncology Nurse Navigator Flowsheets  Navigator Follow Up Date: 02/21/2022  Navigator Follow Up Reason: Appointment Review  Navigator Location [facility]  Navigator Encounter Type Appt/Treatment Plan Review  Patient Visit Type MedOnc  Treatment Phase Active Tx  Barriers/Navigation Needs Coordination of Care;Education  Interventions None Required  Acuity Level 2-Minimal Needs (1-2 Barriers Identified)  Support Groups/Services Friends and Family  Time Spent with Patient 15

## 2022-02-19 ENCOUNTER — Encounter: Payer: Self-pay | Admitting: Hematology & Oncology

## 2022-02-19 ENCOUNTER — Inpatient Hospital Stay: Payer: Self-pay

## 2022-02-19 ENCOUNTER — Other Ambulatory Visit: Payer: Self-pay

## 2022-02-19 ENCOUNTER — Encounter: Payer: Self-pay | Admitting: Family

## 2022-02-19 ENCOUNTER — Other Ambulatory Visit (HOSPITAL_BASED_OUTPATIENT_CLINIC_OR_DEPARTMENT_OTHER): Payer: Self-pay

## 2022-02-19 VITALS — BP 146/65 | HR 99 | Temp 97.9°F | Resp 17

## 2022-02-19 DIAGNOSIS — C787 Secondary malignant neoplasm of liver and intrahepatic bile duct: Secondary | ICD-10-CM

## 2022-02-19 MED ORDER — SODIUM CHLORIDE 0.9% FLUSH: 10.0000 mL | INTRAVENOUS | Status: DC | PRN

## 2022-02-19 MED ORDER — SODIUM CHLORIDE 0.9 % IV SOLN
10.0000 mg | Freq: Once | INTRAVENOUS | Status: AC
  Administered 2022-02-19: 10 mg via INTRAVENOUS
  Filled 2022-02-19: qty 10

## 2022-02-19 MED ORDER — DEXTROSE 5 % IV SOLN: Freq: Once | INTRAVENOUS | Status: AC

## 2022-02-19 MED ORDER — CYCLOBENZAPRINE HCL 10 MG PO TABS
10.0000 mg | ORAL_TABLET | Freq: Three times a day (TID) | ORAL | 0 refills | Status: DC | PRN
  Filled 2022-02-19: qty 90, 30d supply, fill #0

## 2022-02-19 MED ORDER — LEUCOVORIN CALCIUM INJECTION 350 MG
400.0000 mg/m2 | Freq: Once | INTRAVENOUS | Status: AC
  Administered 2022-02-19: 756 mg via INTRAVENOUS
  Filled 2022-02-19: qty 25

## 2022-02-19 MED ORDER — SODIUM CHLORIDE 0.9 % IV SOLN
5.0000 mg/kg | Freq: Once | INTRAVENOUS | Status: AC
  Administered 2022-02-19: 400 mg via INTRAVENOUS
  Filled 2022-02-19: qty 16

## 2022-02-19 MED ORDER — SODIUM CHLORIDE 0.9 % IV SOLN
2400.0000 mg/m2 | INTRAVENOUS | Status: DC
  Administered 2022-02-19: 4550 mg via INTRAVENOUS
  Filled 2022-02-19: qty 91

## 2022-02-19 MED ORDER — SODIUM CHLORIDE 0.9 % IV SOLN
150.0000 mg | Freq: Once | INTRAVENOUS | Status: AC
  Administered 2022-02-19: 150 mg via INTRAVENOUS
  Filled 2022-02-19: qty 150

## 2022-02-19 MED ORDER — KETOROLAC TROMETHAMINE 15 MG/ML IJ SOLN
30.0000 mg | Freq: Once | INTRAMUSCULAR | Status: AC
  Administered 2022-02-19: 30 mg via INTRAVENOUS
  Filled 2022-02-19: qty 2

## 2022-02-19 MED ORDER — HEPARIN SOD (PORK) LOCK FLUSH 100 UNIT/ML IV SOLN: 500.0000 [IU] | Freq: Once | INTRAVENOUS | Status: DC | PRN

## 2022-02-19 MED ORDER — PALONOSETRON HCL INJECTION 0.25 MG/5ML
0.2500 mg | Freq: Once | INTRAVENOUS | Status: AC
  Administered 2022-02-19: 0.25 mg via INTRAVENOUS
  Filled 2022-02-19: qty 5

## 2022-02-19 MED ORDER — ATROPINE SULFATE 1 MG/ML IV SOLN
0.5000 mg | Freq: Once | INTRAVENOUS | Status: AC | PRN
  Administered 2022-02-19: 0.5 mg via INTRAVENOUS
  Filled 2022-02-19: qty 1

## 2022-02-19 MED ORDER — SODIUM CHLORIDE 0.9 % IV SOLN
148.5000 mg/m2 | Freq: Once | INTRAVENOUS | Status: AC
  Administered 2022-02-19: 280 mg via INTRAVENOUS
  Filled 2022-02-19: qty 10

## 2022-02-19 MED ORDER — TRAMADOL HCL 50 MG PO TABS
50.0000 mg | ORAL_TABLET | Freq: Four times a day (QID) | ORAL | 0 refills | Status: DC | PRN
  Filled 2022-02-19: qty 60, 15d supply, fill #0

## 2022-02-19 MED ORDER — OXALIPLATIN CHEMO INJECTION 100 MG/20ML
76.5000 mg/m2 | Freq: Once | INTRAVENOUS | Status: AC
  Administered 2022-02-19: 145 mg via INTRAVENOUS
  Filled 2022-02-19: qty 29

## 2022-02-19 NOTE — Progress Notes (Signed)
Resuming oxaliplatin today, dose decreased per Dr. Antonieta Pert instructions.

## 2022-02-19 NOTE — Patient Instructions (Signed)
[facility]  Discharge Instructions: Thank you for choosing [facility] to provide your oncology and hematology care.   If you have a lab appointment with the [facility], please go directly to the [facility] and check in at the registration area.  Wear comfortable clothing and clothing appropriate for easy access to any Portacath or PICC line.   We strive to give you quality time with your provider. You may need to reschedule your appointment if you arrive late (15 or more minutes).  Arriving late affects you and other patients whose appointments are after yours.  Also, if you miss three or more appointments without notifying the office, you may be dismissed from the clinic at the provider's discretion.      For prescription refill requests, have your pharmacy contact our office and allow 72 hours for refills to be completed.    Today you received the following chemotherapy and/or immunotherapy agents FOLFOXIRI, Avastin      To help prevent nausea and vomiting after your treatment, we encourage you to take your nausea medication as directed.  BELOW ARE SYMPTOMS THAT SHOULD BE REPORTED IMMEDIATELY: *FEVER GREATER THAN 100.4 F (38 C) OR HIGHER *CHILLS OR SWEATING *NAUSEA AND VOMITING THAT IS NOT CONTROLLED WITH YOUR NAUSEA MEDICATION *UNUSUAL SHORTNESS OF BREATH *UNUSUAL BRUISING OR BLEEDING *URINARY PROBLEMS (pain or burning when urinating, or frequent urination) *BOWEL PROBLEMS (unusual diarrhea, constipation, pain near the anus) TENDERNESS IN MOUTH AND THROAT WITH OR WITHOUT PRESENCE OF ULCERS (sore throat, sores in mouth, or a toothache) UNUSUAL RASH, SWELLING OR PAIN  UNUSUAL VAGINAL DISCHARGE OR ITCHING   Items with * indicate a potential emergency and should be followed up as soon as possible or go to the Emergency Department if any problems should occur.  Please show the CHEMOTHERAPY ALERT CARD or IMMUNOTHERAPY ALERT CARD at check-in  to the Emergency Department and triage nurse. Should you have questions after your visit or need to cancel or reschedule your appointment, please contact [facility]  [phone] and follow the prompts.  Office hours are 8:00 a.m. to 4:30 p.m. Monday - Friday. Please note that voicemails left after 4:00 p.m. may not be returned until the following business day.  We are closed weekends and major holidays. You have access to a nurse at all times for urgent questions. Please call the main number to the clinic [phone] and follow the prompts.  For any non-urgent questions, you may also contact your provider using MyChart. We now offer e-Visits for anyone 67 and older to request care online for non-urgent symptoms. For details visit [email or web address].   Also download the MyChart app! Go to the app store, search "MyChart", open the app, select [facility], and log in with your MyChart username and password.  Masks are optional in the cancer centers. If you would like for your care team to wear a mask while they are taking care of you, please let them know. You may have one support person who is at least 54 years old accompany you for your appointments.

## 2022-02-21 ENCOUNTER — Encounter: Payer: Self-pay | Admitting: *Deleted

## 2022-02-21 ENCOUNTER — Inpatient Hospital Stay: Payer: Self-pay

## 2022-02-21 VITALS — BP 132/65 | HR 90 | Resp 16

## 2022-02-21 DIAGNOSIS — C189 Malignant neoplasm of colon, unspecified: Secondary | ICD-10-CM

## 2022-02-21 MED ORDER — SODIUM CHLORIDE 0.9% FLUSH
10.0000 mL | INTRAVENOUS | Status: DC | PRN
  Administered 2022-02-21: 10 mL

## 2022-02-21 MED ORDER — HEPARIN SOD (PORK) LOCK FLUSH 100 UNIT/ML IV SOLN
500.0000 [IU] | Freq: Once | INTRAVENOUS | Status: AC | PRN
  Administered 2022-02-21: 500 [IU]

## 2022-02-21 NOTE — Progress Notes (Signed)
Patient was to get Udenyca today.  PAtient stated she had never gotten it before with this treatment.  Spoke with dr [name] who said it is fine to not get it this time and watch her counts.

## 2022-02-21 NOTE — Patient Instructions (Signed)

## 2022-02-23 ENCOUNTER — Other Ambulatory Visit (HOSPITAL_COMMUNITY): Payer: Self-pay | Admitting: Interventional Radiology

## 2022-02-23 ENCOUNTER — Encounter: Payer: Self-pay | Admitting: *Deleted

## 2022-02-23 DIAGNOSIS — C189 Malignant neoplasm of colon, unspecified: Secondary | ICD-10-CM

## 2022-02-23 NOTE — Progress Notes (Signed)
Patient is scheduled for her pre-Y90 appointment on 03/15/22 and treatment on 03/29/22.  Oncology Nurse Navigator Documentation     02/23/2022   10:00 AM  Oncology Nurse Navigator Flowsheets  Navigator Follow Up Date: 03/15/2022  Navigator Follow Up Reason: Scan Review  Navigator Location [facility]  Navigator Encounter Type Appt/Treatment Plan Review  Patient Visit Type MedOnc  Treatment Phase Active Tx  Barriers/Navigation Needs Coordination of Care;Education  Interventions None Required  Acuity Level 2-Minimal Needs (1-2 Barriers Identified)  Support Groups/Services Friends and Family  Time Spent with Patient 15

## 2022-02-26 ENCOUNTER — Other Ambulatory Visit: Payer: Self-pay

## 2022-02-27 ENCOUNTER — Other Ambulatory Visit: Payer: Self-pay

## 2022-03-05 ENCOUNTER — Inpatient Hospital Stay: Payer: Self-pay

## 2022-03-05 ENCOUNTER — Other Ambulatory Visit: Payer: Self-pay | Admitting: Hematology & Oncology

## 2022-03-05 ENCOUNTER — Inpatient Hospital Stay: Payer: Self-pay | Attending: Hematology & Oncology

## 2022-03-05 VITALS — BP 136/73 | HR 81 | Temp 97.8°F | Resp 19

## 2022-03-05 DIAGNOSIS — C182 Malignant neoplasm of ascending colon: Secondary | ICD-10-CM | POA: Insufficient documentation

## 2022-03-05 DIAGNOSIS — C787 Secondary malignant neoplasm of liver and intrahepatic bile duct: Secondary | ICD-10-CM | POA: Insufficient documentation

## 2022-03-05 DIAGNOSIS — Z5189 Encounter for other specified aftercare: Secondary | ICD-10-CM | POA: Insufficient documentation

## 2022-03-05 DIAGNOSIS — C189 Malignant neoplasm of colon, unspecified: Secondary | ICD-10-CM

## 2022-03-05 DIAGNOSIS — Z5111 Encounter for antineoplastic chemotherapy: Secondary | ICD-10-CM | POA: Insufficient documentation

## 2022-03-05 LAB — CMP (CANCER CENTER ONLY)
ALT: 14 U/L (ref 0–44)
AST: 20 U/L (ref 15–41)
Albumin: 4 g/dL (ref 3.5–5.0)
Alkaline Phosphatase: 140 U/L — ABNORMAL HIGH (ref 38–126)
Anion gap: 8 (ref 5–15)
BUN: 7 mg/dL (ref 6–20)
CO2: 28 mmol/L (ref 22–32)
Calcium: 9.3 mg/dL (ref 8.9–10.3)
Chloride: 104 mmol/L (ref 98–111)
Creatinine: 0.55 mg/dL (ref 0.44–1.00)
GFR, Estimated: >60 mL/min (ref >60)
Glucose, Bld: 98 mg/dL (ref 70–99)
Potassium: 3.7 mmol/L (ref 3.5–5.1)
Sodium: 140 mmol/L (ref 135–145)
Total Bilirubin: 0.3 mg/dL (ref 0.3–1.2)
Total Protein: 7.2 g/dL (ref 6.5–8.1)

## 2022-03-05 LAB — CBC WITH DIFFERENTIAL (CANCER CENTER ONLY)
Abs Immature Granulocytes: 0.01 10*3/uL (ref 0.00–0.07)
Basophils Absolute: 0 10*3/uL (ref 0.0–0.1)
Basophils Relative: 0 %
Eosinophils Absolute: 0.2 10*3/uL (ref 0.0–0.5)
Eosinophils Relative: 7 %
HCT: 35.4 % — ABNORMAL LOW (ref 36.0–46.0)
Hemoglobin: 11.4 g/dL — ABNORMAL LOW (ref 12.0–15.0)
Immature Granulocytes: 0 %
Lymphocytes Relative: 32 %
Lymphs Abs: 1 10*3/uL (ref 0.7–4.0)
MCH: 28.1 pg (ref 26.0–34.0)
MCHC: 32.2 g/dL (ref 30.0–36.0)
MCV: 87.4 fL (ref 80.0–100.0)
Monocytes Absolute: 0.4 10*3/uL (ref 0.1–1.0)
Monocytes Relative: 12 %
Neutro Abs: 1.6 10*3/uL — ABNORMAL LOW (ref 1.7–7.7)
Neutrophils Relative %: 49 %
Platelet Count: 177 10*3/uL (ref 150–400)
RBC: 4.05 MIL/uL (ref 3.87–5.11)
RDW: 15.9 % — ABNORMAL HIGH (ref 11.5–15.5)
WBC Count: 3.3 10*3/uL — ABNORMAL LOW (ref 4.0–10.5)
nRBC: 0 % (ref 0.0–0.2)

## 2022-03-05 MED ORDER — ATROPINE SULFATE 1 MG/ML IV SOLN
INTRAVENOUS | Status: AC
  Filled 2022-03-05: qty 1

## 2022-03-05 MED ORDER — SODIUM CHLORIDE 0.9 % IV SOLN
148.5000 mg/m2 | Freq: Once | INTRAVENOUS | Status: AC
  Administered 2022-03-05: 280 mg via INTRAVENOUS
  Filled 2022-03-05: qty 10

## 2022-03-05 MED ORDER — OXALIPLATIN CHEMO INJECTION 100 MG/20ML
76.5000 mg/m2 | Freq: Once | INTRAVENOUS | Status: AC
  Administered 2022-03-05: 145 mg via INTRAVENOUS
  Filled 2022-03-05: qty 29

## 2022-03-05 MED ORDER — PALONOSETRON HCL INJECTION 0.25 MG/5ML
0.2500 mg | Freq: Once | INTRAVENOUS | Status: AC
  Administered 2022-03-05: 0.25 mg via INTRAVENOUS

## 2022-03-05 MED ORDER — SODIUM CHLORIDE 0.9 % IV SOLN
150.0000 mg | Freq: Once | INTRAVENOUS | Status: AC
  Administered 2022-03-05: 150 mg via INTRAVENOUS
  Filled 2022-03-05: qty 150

## 2022-03-05 MED ORDER — ATROPINE SULFATE 1 MG/ML IV SOLN
0.5000 mg | Freq: Once | INTRAVENOUS | Status: AC | PRN
  Administered 2022-03-05: 0.5 mg via INTRAVENOUS

## 2022-03-05 MED ORDER — DEXTROSE 5 % IV SOLN: Freq: Once | INTRAVENOUS | Status: AC

## 2022-03-05 MED ORDER — LEUCOVORIN CALCIUM INJECTION 350 MG
400.0000 mg/m2 | Freq: Once | INTRAVENOUS | Status: AC
  Administered 2022-03-05: 756 mg via INTRAVENOUS
  Filled 2022-03-05: qty 25

## 2022-03-05 MED ORDER — SODIUM CHLORIDE 0.9 % IV SOLN
2400.0000 mg/m2 | INTRAVENOUS | Status: DC
  Administered 2022-03-05: 4550 mg via INTRAVENOUS
  Filled 2022-03-05: qty 91

## 2022-03-05 MED ORDER — SODIUM CHLORIDE 0.9 % IV SOLN
10.0000 mg | Freq: Once | INTRAVENOUS | Status: AC
  Administered 2022-03-05: 10 mg via INTRAVENOUS
  Filled 2022-03-05: qty 10

## 2022-03-05 NOTE — Patient Instructions (Signed)

## 2022-03-05 NOTE — Progress Notes (Signed)
Decrease oxaliplatin dose per Dr. Antonieta Pert previous instructions per Altamese Dilling, NP.

## 2022-03-05 NOTE — Patient Instructions (Signed)
[facility]  Discharge Instructions: Thank you for choosing [facility] to provide your oncology and hematology care.   If you have a lab appointment with the [facility], please go directly to the [facility] and check in at the registration area.  Wear comfortable clothing and clothing appropriate for easy access to any Portacath or PICC line.   We strive to give you quality time with your provider. You may need to reschedule your appointment if you arrive late (15 or more minutes).  Arriving late affects you and other patients whose appointments are after yours.  Also, if you miss three or more appointments without notifying the office, you may be dismissed from the clinic at the provider's discretion.      For prescription refill requests, have your pharmacy contact our office and allow 72 hours for refills to be completed.    Today you received the following chemotherapy and/or immunotherapy agents FOLFOXIRI (Irinotecan, Oxaliplatin and 5FU)     To help prevent nausea and vomiting after your treatment, we encourage you to take your nausea medication as directed.  BELOW ARE SYMPTOMS THAT SHOULD BE REPORTED IMMEDIATELY: *FEVER GREATER THAN 100.4 F (38 C) OR HIGHER *CHILLS OR SWEATING *NAUSEA AND VOMITING THAT IS NOT CONTROLLED WITH YOUR NAUSEA MEDICATION *UNUSUAL SHORTNESS OF BREATH *UNUSUAL BRUISING OR BLEEDING *URINARY PROBLEMS (pain or burning when urinating, or frequent urination) *BOWEL PROBLEMS (unusual diarrhea, constipation, pain near the anus) TENDERNESS IN MOUTH AND THROAT WITH OR WITHOUT PRESENCE OF ULCERS (sore throat, sores in mouth, or a toothache) UNUSUAL RASH, SWELLING OR PAIN  UNUSUAL VAGINAL DISCHARGE OR ITCHING   Items with * indicate a potential emergency and should be followed up as soon as possible or go to the Emergency Department if any problems should occur.  Please show the CHEMOTHERAPY ALERT CARD or  IMMUNOTHERAPY ALERT CARD at check-in to the Emergency Department and triage nurse. Should you have questions after your visit or need to cancel or reschedule your appointment, please contact [facility]  [phone] and follow the prompts.  Office hours are 8:00 a.m. to 4:30 p.m. Monday - Friday. Please note that voicemails left after 4:00 p.m. may not be returned until the following business day.  We are closed weekends and major holidays. You have access to a nurse at all times for urgent questions. Please call the main number to the clinic [phone] and follow the prompts.  For any non-urgent questions, you may also contact your provider using MyChart. We now offer e-Visits for anyone 62 and older to request care online for non-urgent symptoms. For details visit [email or web address].   Also download the MyChart app! Go to the app store, search "MyChart", open the app, select [facility], and log in with your MyChart username and password.  Masks are optional in the cancer centers. If you would like for your care team to wear a mask while they are taking care of you, please let them know. You may have one support person who is at least 54 years old accompany you for your appointments.

## 2022-03-05 NOTE — Progress Notes (Signed)
Per Josetta Huddle NP hold Avastin for this treatment secondary to pt having procedure next week.

## 2022-03-06 ENCOUNTER — Telehealth: Payer: Self-pay

## 2022-03-06 NOTE — Telephone Encounter (Signed)
-----   Message from [name], MD sent at 03/05/2022  3:57 PM EDT ----- I think that she will need Neulasta in order to keep her WBC up so we can keep her on schedule.  Please make sure this is done this week.  [name]

## 2022-03-06 NOTE — Telephone Encounter (Signed)
Called and informed patient of lab results, patient verbalized understanding and denies any questions or concerns at this time. Pt has a pump d/c apt for 03/07/22 and plans to come in for Neulasta injection then. Message sent to scheduling.

## 2022-03-07 ENCOUNTER — Inpatient Hospital Stay: Payer: Self-pay

## 2022-03-07 VITALS — BP 125/69 | HR 73 | Temp 97.9°F | Resp 18

## 2022-03-07 DIAGNOSIS — C189 Malignant neoplasm of colon, unspecified: Secondary | ICD-10-CM

## 2022-03-07 MED ORDER — PEGFILGRASTIM-CBQV 6 MG/0.6ML ~~LOC~~ SOSY
6.0000 mg | PREFILLED_SYRINGE | Freq: Once | SUBCUTANEOUS | Status: AC
  Administered 2022-03-07: 6 mg via SUBCUTANEOUS
  Filled 2022-03-07: qty 0.6

## 2022-03-07 MED ORDER — HEPARIN SOD (PORK) LOCK FLUSH 100 UNIT/ML IV SOLN
500.0000 [IU] | Freq: Once | INTRAVENOUS | Status: AC | PRN
  Administered 2022-03-07: 500 [IU]

## 2022-03-07 MED ORDER — SODIUM CHLORIDE 0.9% FLUSH
10.0000 mL | INTRAVENOUS | Status: DC | PRN
  Administered 2022-03-07: 10 mL

## 2022-03-07 NOTE — Patient Instructions (Signed)

## 2022-03-08 ENCOUNTER — Encounter: Payer: Self-pay | Admitting: Hematology & Oncology

## 2022-03-08 ENCOUNTER — Encounter: Payer: Self-pay | Admitting: Family

## 2022-03-12 ENCOUNTER — Other Ambulatory Visit: Payer: Self-pay | Admitting: Physician Assistant

## 2022-03-12 DIAGNOSIS — R053 Chronic cough: Secondary | ICD-10-CM

## 2022-03-12 DIAGNOSIS — U099 Post covid-19 condition, unspecified: Secondary | ICD-10-CM

## 2022-03-12 DIAGNOSIS — R0602 Shortness of breath: Secondary | ICD-10-CM

## 2022-03-14 ENCOUNTER — Encounter: Payer: Self-pay | Admitting: Hematology & Oncology

## 2022-03-14 ENCOUNTER — Encounter: Payer: Self-pay | Admitting: Family

## 2022-03-14 ENCOUNTER — Other Ambulatory Visit: Payer: Self-pay | Admitting: Student

## 2022-03-14 DIAGNOSIS — C787 Secondary malignant neoplasm of liver and intrahepatic bile duct: Secondary | ICD-10-CM

## 2022-03-14 NOTE — H&P (Signed)
Chief Complaint: Patient was seen in consultation today for pre-Y90 arterial mapping  Referring Physician(s): [place] J  Supervising Physician: {Supervising Physician:[id number]}  Patient Status: [facility] - Out-[facility]  History of Present Illness: Rebecca Bullock is a 56 y.o. female with a medical history significant for metastatic colon cancer. She was sent to the ED 03/14/21 by her PCP when lab work showed a hemoglobin of 5.3. She was admitted for treatment and was discharged with outpatient GI follow up. An 04/18/21 colonoscopy identified a mass in the ascending colon with pathology positive for adenocarcinoma. Additional imaging showed liver and lymph node metastases. She was seen in IR 05/11/21 for port-a-catheter placement.   She has received numerous rounds of chemotherapy. There had been plans to have a colectomy with possible hepatectomy in September at [facility]. Unfortunately this was cancelled due to perceived enlargement of the liver mass and concern for lymphadenopathy. She was referred to Interventional Radiology to discuss liver-directed treatments options and she met with Dr. Serafina Royals via tele-health visit on 02/15/22. Given the location of the tumor and size, a transarterial radioembolization was recommended. The risks and benefits of this procedure were discussed and the patient was agreeable to proceed.   She presents today to the [facility] department for an image-guided hepatic angiogram with Tc-MAA mapping study. Y-90 administration is tentatively scheduled in two weeks.   Past Medical History:  Diagnosis Date   Colon cancer metastasized to liver [facility]) 05/05/2021   Family history of lung cancer    Family history of pancreatic cancer    Goals of care, counseling/discussion 05/05/2021    Past Surgical History:  Procedure Laterality Date   IR IMAGING GUIDED PORT INSERTION  05/11/2021   IR RADIOLOGIST EVAL & MGMT  02/15/2022   uterine ablation       Allergies: Augmentin [amoxicillin-pot clavulanate], Irinotecan, Metronidazole, Vancomycin, and Tape  Medications: Prior to Admission medications   Medication Sig Start Date End Date Taking? Authorizing Provider  Acetaminophen 325 MG CAPS Take 650 mg by mouth every 8 (eight) hours as needed for moderate pain or headache. Patient not taking: Reported on 01/10/2022    [provider]  betamethasone valerate (VALISONE) 0.1 % cream Apply 1 application  topically See admin instructions. Apply 1 application topically every other day as needed for psoriasis flare Patient not taking: Reported on 01/10/2022 11/11/20   [provider]  cyclobenzaprine (FLEXERIL) 10 MG tablet Take 1 tablet (10 mg total) by mouth 3 (three) times daily as needed for muscle spasms. 02/19/22   [name], MD  dexamethasone (DECADRON) 4 MG tablet Take 2 tablets (8 mg total) by mouth daily. Start the day after chemotherapy for 3 days. Take with food. Patient not taking: Reported on 12/20/2021 11/06/21   [name], MD  diphenoxylate-atropine (LOMOTIL) 2.5-0.025 MG tablet Take 2 tablets by mouth 4 (four) times daily as needed for diarrhea or loose stools. Patient not taking: Reported on 01/10/2022 11/22/21   [name], MD  encorafenib (BRAFTOVI) 75 MG capsule Take 4 capsules (300 mg total) by mouth daily. Patient not taking: Reported on 12/20/2021 06/01/21   [name], MD  erythromycin base (E-MYCIN) 500 MG tablet Take two tablets by mouth at 8:00pm, 9:00pm and 11:00pm the night before surgery. 02/06/22     estradiol (ESTRACE) 0.1 MG/GM vaginal cream Place 1 Applicatorful vaginally daily as needed (dryness / irritation). Patient not taking: Reported on 01/10/2022 11/11/20   [provider]  fluticasone (FLONASE) 50 MCG/ACT  nasal spray Place 2 sprays into both nostrils daily. Patient not taking: Reported on 01/10/2022 09/18/16   Emeterio Reeve, DO  HYDROcodone-acetaminophen  (NORCO/VICODIN) 5-325 MG tablet Take 1 tablet by mouth every 4 (four) hours as needed for moderate pain. Patient not taking: Reported on 10/31/2021 08/23/21   [name], MD  hydrocortisone 1 % lotion Apply 1 application topically 2 (two) times daily as needed for itching. Patient not taking: Reported on 01/10/2022 06/26/21   [name], MD  LORazepam (ATIVAN) 0.5 MG tablet Take 1 tablet (0.5 mg total) by mouth every 6 (six) hours as needed for anxiety. Patient not taking: Reported on 01/10/2022 11/13/21   [name], MD  neomycin (MYCIFRADIN) 500 MG tablet Take two tablets by mouth at 8:00pm, 9:00pm and 11:00pm the night before surgery. 02/06/22     omeprazole (PRILOSEC OTC) 20 MG tablet Take 20 mg by mouth daily as needed (acid reflux). Patient not taking: Reported on 01/10/2022    [provider]  ondansetron (ZOFRAN) 8 MG tablet Take 1 tablet (8 mg total) by mouth 2 (two) times daily as needed. Start on day 3 after chemotherapy. 11/06/21   [name], MD  prochlorperazine (COMPAZINE) 10 MG tablet Take 1 tablet (10 mg total) by mouth every 6 (six) hours as needed (Nausea or vomiting). 11/06/21   [name], MD  traMADol (ULTRAM) 50 MG tablet Take 1 tablet (50 mg total) by mouth every 6 (six) hours as needed. 02/19/22   [name], MD     Family History  Problem Relation Age of Onset   Lung cancer Mother    Diabetes Maternal Grandmother    Pancreatic cancer Maternal Grandfather    Multiple sclerosis Maternal Aunt    Alcohol abuse Maternal Uncle     Social History   Socioeconomic History   Marital status: Single    Spouse name: Not on file   Number of children: Not on file   Years of education: Not on file   Highest education level: Not on file  Occupational History   Not on file  Tobacco Use   Smoking status: Never   Smokeless tobacco: Never  Vaping Use   Vaping Use: Never used  Substance and Sexual Activity   Alcohol use: Yes    Comment:  occasionally   Drug use: Not Currently   Sexual activity: Not on file  Other Topics Concern   Not on file  Social History Narrative   Not on file   Social Determinants of Health   Financial Resource Strain: Medium Risk (05/18/2021)   Overall Financial Resource Strain (CARDIA)    Difficulty of Paying Living Expenses: Somewhat hard  Food Insecurity: No Food Insecurity (05/18/2021)   Hunger Vital Sign    Worried About Running Out of Food in the Last Year: Never true    Ran Out of Food in the Last Year: Never true  Transportation Needs: No Transportation Needs (05/18/2021)   PRAPARE - Hydrologist (Medical): No    Lack of Transportation (Non-Medical): No  Physical Activity: Not on file  Stress: Not on file  Social Connections: Not on file    Review of Systems: A 12 point ROS discussed and pertinent positives are indicated in the HPI above.  All other systems are negative.  Review of Systems  Vital Signs: There were no vitals taken for this visit.  Physical Exam  Imaging: IR Radiologist Eval & Mgmt  Result  Date: 02/15/2022 Please refer to notes tab for details about interventional procedure. (Op Note)   Labs:  CBC: Recent Labs    01/17/22 0855 01/31/22 0818 02/15/22 0840 03/05/22 0925  WBC 5.6 4.3 6.4 3.3*  HGB 11.4* 11.5* 11.4* 11.4*  HCT 36.5 36.2 36.2 35.4*  PLT 186 159 200 177    COAGS: Recent Labs    06/19/21 2331  INR 1.0  APTT 26    BMP: Recent Labs    01/17/22 0855 01/31/22 0818 02/15/22 0840 03/05/22 0925  NA 140 139 138 140  K 3.9 3.9 3.6 3.7  CL 106 105 105 104  CO2 '27 28 25 28  ' GLUCOSE 87 94 147* 98  BUN '10 6 8 7  ' CALCIUM 9.4 9.4 8.7* 9.3  CREATININE 0.56 0.55 0.63 0.55  GFRNONAA >60 >60 >60 >60    LIVER FUNCTION TESTS: Recent Labs    01/17/22 0855 01/31/22 0818 02/15/22 0840 03/05/22 0925  BILITOT 0.2* 0.3 0.2* 0.3  AST 16 14* 30 20  ALT '12 9 15 14  ' ALKPHOS 115 107 144* 140*  PROT 7.2 6.8  7.3 7.2  ALBUMIN 4.0 3.9 3.3* 4.0    TUMOR MARKERS: No results for input(s): "AFPTM", "CEA", "CA199", "CHROMGRNA" in the last 8760 hours.  Assessment and Plan:  Colon cancer with liver metastases: Rebecca Bullock, 56 year old female, presents today to the [facility] department for an image-guided hepatic angiogram with Tc-MAA mapping study.  Risks and benefits discussed with the patient including, but not limited to bleeding, infection, vascular injury, post procedural pain, nausea, vomiting and fatigue, contrast induced renal failure, liver failure, radiation injury to the bowel, radiation induced cholecystitis, neutropenia and possible need for additional procedures.  All of the patient's questions were answered, patient is agreeable to proceed. She has been NPO.   Consent signed and in chart.  Thank you for this interesting consult.  I greatly enjoyed meeting Rebecca Bullock and look forward to participating in their care.  A copy of this report was sent to the requesting provider on this date.  Electronically Signed: Soyla Dryer, AGACNP-BC [phone] 03/14/2022, 2:29 PM   I spent a total of  30 Minutes   in face to face in clinical consultation, greater than 50% of which was counseling/coordinating care for pre-Y90 arterial mapping

## 2022-03-15 ENCOUNTER — Ambulatory Visit (HOSPITAL_COMMUNITY)
Admission: RE | Admit: 2022-03-15 | Discharge: 2022-03-15 | Disposition: A | Discharge status: Home / Self Care | Payer: Self-pay | Source: Ambulatory Visit | Attending: Interventional Radiology | Admitting: Interventional Radiology

## 2022-03-15 ENCOUNTER — Other Ambulatory Visit (HOSPITAL_COMMUNITY): Payer: Self-pay | Admitting: Interventional Radiology

## 2022-03-15 ENCOUNTER — Encounter (HOSPITAL_COMMUNITY): Payer: Self-pay

## 2022-03-15 ENCOUNTER — Encounter: Payer: Self-pay | Admitting: *Deleted

## 2022-03-15 ENCOUNTER — Encounter (HOSPITAL_COMMUNITY)
Admission: RE | Admit: 2022-03-15 | Discharge: 2022-03-15 | Disposition: A | Discharge status: Home / Self Care | Payer: Self-pay | Source: Ambulatory Visit | Attending: Interventional Radiology | Admitting: Interventional Radiology

## 2022-03-15 DIAGNOSIS — C787 Secondary malignant neoplasm of liver and intrahepatic bile duct: Secondary | ICD-10-CM | POA: Insufficient documentation

## 2022-03-15 DIAGNOSIS — C189 Malignant neoplasm of colon, unspecified: Secondary | ICD-10-CM

## 2022-03-15 HISTORY — PX: IR EMBO ARTERIAL NOT HEMORR HEMANG INC GUIDE ROADMAPPING: IMG5448

## 2022-03-15 HISTORY — PX: IR ANGIOGRAM SELECTIVE EACH ADDITIONAL VESSEL: IMG667

## 2022-03-15 HISTORY — PX: IR 3D INDEPENDENT WKST: IMG2385

## 2022-03-15 HISTORY — PX: IR ANGIOGRAM VISCERAL SELECTIVE: IMG657

## 2022-03-15 LAB — CBC WITH DIFFERENTIAL/PLATELET
Abs Immature Granulocytes: 0.4 10*3/uL — ABNORMAL HIGH (ref 0.00–0.07)
Basophils Absolute: 0 10*3/uL (ref 0.0–0.1)
Basophils Relative: 1 %
Eosinophils Absolute: 0.1 10*3/uL (ref 0.0–0.5)
Eosinophils Relative: 1 %
HCT: 35.8 % — ABNORMAL LOW (ref 36.0–46.0)
Hemoglobin: 11.1 g/dL — ABNORMAL LOW (ref 12.0–15.0)
Immature Granulocytes: 6 %
Lymphocytes Relative: 22 %
Lymphs Abs: 1.6 10*3/uL (ref 0.7–4.0)
MCH: 28.3 pg (ref 26.0–34.0)
MCHC: 31 g/dL (ref 30.0–36.0)
MCV: 91.3 fL (ref 80.0–100.0)
Monocytes Absolute: 0.6 10*3/uL (ref 0.1–1.0)
Monocytes Relative: 9 %
Neutro Abs: 4.5 10*3/uL (ref 1.7–7.7)
Neutrophils Relative %: 61 %
Platelets: 121 10*3/uL — ABNORMAL LOW (ref 150–400)
RBC: 3.92 MIL/uL (ref 3.87–5.11)
RDW: 16.6 % — ABNORMAL HIGH (ref 11.5–15.5)
WBC: 7.2 10*3/uL (ref 4.0–10.5)
nRBC: 0.3 % — ABNORMAL HIGH (ref 0.0–0.2)

## 2022-03-15 LAB — COMPREHENSIVE METABOLIC PANEL
ALT: 21 U/L (ref 0–44)
AST: 19 U/L (ref 15–41)
Albumin: 3.4 g/dL — ABNORMAL LOW (ref 3.5–5.0)
Alkaline Phosphatase: 118 U/L (ref 38–126)
Anion gap: 6 (ref 5–15)
BUN: 9 mg/dL (ref 6–20)
CO2: 24 mmol/L (ref 22–32)
Calcium: 8.7 mg/dL — ABNORMAL LOW (ref 8.9–10.3)
Chloride: 110 mmol/L (ref 98–111)
Creatinine, Ser: 0.55 mg/dL (ref 0.44–1.00)
GFR, Estimated: >60 mL/min (ref >60)
Glucose, Bld: 95 mg/dL (ref 70–99)
Potassium: 3.5 mmol/L (ref 3.5–5.1)
Sodium: 140 mmol/L (ref 135–145)
Total Bilirubin: 0.2 mg/dL — ABNORMAL LOW (ref 0.3–1.2)
Total Protein: 6.6 g/dL (ref 6.5–8.1)

## 2022-03-15 LAB — PROTIME-INR
INR: 1 (ref 0.8–1.2)
Prothrombin Time: 13 seconds (ref 11.4–15.2)

## 2022-03-15 MED ORDER — FENTANYL CITRATE (PF) 100 MCG/2ML IJ SOLN
INTRAMUSCULAR | Status: AC
  Filled 2022-03-15: qty 2

## 2022-03-15 MED ORDER — IOHEXOL 300 MG/ML  SOLN
100.0000 mL | Freq: Once | INTRAMUSCULAR | Status: AC | PRN
  Administered 2022-03-15: 65 mL via INTRA_ARTERIAL

## 2022-03-15 MED ORDER — MIDAZOLAM HCL 2 MG/2ML IJ SOLN
INTRAMUSCULAR | Status: AC | PRN
  Administered 2022-03-15: 1 mg via INTRAVENOUS

## 2022-03-15 MED ORDER — HEPARIN SOD (PORK) LOCK FLUSH 100 UNIT/ML IV SOLN
500.0000 [IU] | INTRAVENOUS | Status: AC | PRN
  Administered 2022-03-15: 500 [IU]
  Filled 2022-03-15: qty 5

## 2022-03-15 MED ORDER — LIDOCAINE HCL 1 % IJ SOLN
INTRAMUSCULAR | Status: AC | PRN
  Administered 2022-03-15: 10 mL via INTRADERMAL

## 2022-03-15 MED ORDER — FENTANYL CITRATE (PF) 100 MCG/2ML IJ SOLN
INTRAMUSCULAR | Status: AC | PRN
  Administered 2022-03-15: 50 ug via INTRAVENOUS

## 2022-03-15 MED ORDER — MIDAZOLAM HCL 2 MG/2ML IJ SOLN
INTRAMUSCULAR | Status: AC | PRN
  Administered 2022-03-15: .5 mg via INTRAVENOUS

## 2022-03-15 MED ORDER — LIDOCAINE HCL 1 % IJ SOLN
INTRAMUSCULAR | Status: AC
  Filled 2022-03-15: qty 20

## 2022-03-15 MED ORDER — SODIUM CHLORIDE 0.9 % IV SOLN: INTRAVENOUS | Status: DC

## 2022-03-15 MED ORDER — MIDAZOLAM HCL 2 MG/2ML IJ SOLN
INTRAMUSCULAR | Status: AC
  Filled 2022-03-15: qty 4

## 2022-03-15 MED ORDER — FENTANYL CITRATE (PF) 100 MCG/2ML IJ SOLN
INTRAMUSCULAR | Status: AC | PRN
  Administered 2022-03-15: 25 ug via INTRAVENOUS

## 2022-03-15 MED ORDER — TECHNETIUM TO 99M ALBUMIN AGGREGATED
4.0000 | Freq: Once | INTRAVENOUS | Status: AC | PRN
  Administered 2022-03-15: 4 via INTRAVENOUS

## 2022-03-15 NOTE — Procedures (Signed)
Interventional Radiology Procedure Note  Procedure:  1) Hepatic angiogram 2) Tc-MAA injection  Findings: Please refer to procedural dictation for full description. Right CFA access, 6 Fr Angioseal closure  Complications: None immediate  Estimated Blood Loss: < 5 mL  Recommendations: Strict 4 hour bedrest (head of bed flat until 12:50, then up to 30 degrees until 14:50). IR will arrange for follow up.   Ruthann Cancer, MD

## 2022-03-15 NOTE — Progress Notes (Signed)
Y90 planning today. Patient is scheduled for treatment on 03/29/2022.  Oncology Nurse Navigator Documentation     03/15/2022   11:00 AM  Oncology Nurse Navigator Flowsheets  Phase of Treatment Targeted Therapy  Navigator Follow Up Date: 03/29/2022  Navigator Follow Up Reason: Other:  Navigator Location [facility]  Navigator Encounter Type Appt/Treatment Plan Review  Patient Visit Type MedOnc  Treatment Phase Active Tx  Barriers/Navigation Needs Coordination of Care;Education  Interventions None Required  Acuity Level 2-Minimal Needs (1-2 Barriers Identified)  Support Groups/Services Friends and Family  Time Spent with Patient 15

## 2022-03-16 ENCOUNTER — Other Ambulatory Visit: Payer: Self-pay | Admitting: Hematology & Oncology

## 2022-03-16 DIAGNOSIS — C189 Malignant neoplasm of colon, unspecified: Secondary | ICD-10-CM

## 2022-03-17 LAB — CEA: CEA: 9.8 ng/mL — ABNORMAL HIGH (ref 0.0–4.7)

## 2022-03-19 ENCOUNTER — Inpatient Hospital Stay: Payer: Self-pay

## 2022-03-19 ENCOUNTER — Ambulatory Visit: Payer: Medicaid Other

## 2022-03-19 ENCOUNTER — Encounter: Payer: Self-pay | Admitting: *Deleted

## 2022-03-19 VITALS — BP 129/67 | HR 87 | Temp 97.4°F | Resp 20

## 2022-03-19 DIAGNOSIS — C189 Malignant neoplasm of colon, unspecified: Secondary | ICD-10-CM

## 2022-03-19 LAB — CBC WITH DIFFERENTIAL (CANCER CENTER ONLY)
Abs Immature Granulocytes: 0.12 10*3/uL — ABNORMAL HIGH (ref 0.00–0.07)
Basophils Absolute: 0 10*3/uL (ref 0.0–0.1)
Basophils Relative: 0 %
Eosinophils Absolute: 0.1 10*3/uL (ref 0.0–0.5)
Eosinophils Relative: 1 %
HCT: 35.2 % — ABNORMAL LOW (ref 36.0–46.0)
Hemoglobin: 11.3 g/dL — ABNORMAL LOW (ref 12.0–15.0)
Immature Granulocytes: 2 %
Lymphocytes Relative: 21 %
Lymphs Abs: 1.3 10*3/uL (ref 0.7–4.0)
MCH: 28.4 pg (ref 26.0–34.0)
MCHC: 32.1 g/dL (ref 30.0–36.0)
MCV: 88.4 fL (ref 80.0–100.0)
Monocytes Absolute: 0.4 10*3/uL (ref 0.1–1.0)
Monocytes Relative: 6 %
Neutro Abs: 4.5 10*3/uL (ref 1.7–7.7)
Neutrophils Relative %: 70 %
Platelet Count: 110 10*3/uL — ABNORMAL LOW (ref 150–400)
RBC: 3.98 MIL/uL (ref 3.87–5.11)
RDW: 17 % — ABNORMAL HIGH (ref 11.5–15.5)
WBC Count: 6.4 10*3/uL (ref 4.0–10.5)
nRBC: 0 % (ref 0.0–0.2)

## 2022-03-19 LAB — CMP (CANCER CENTER ONLY)
ALT: 15 U/L (ref 0–44)
AST: 20 U/L (ref 15–41)
Albumin: 4.2 g/dL (ref 3.5–5.0)
Alkaline Phosphatase: 135 U/L — ABNORMAL HIGH (ref 38–126)
Anion gap: 10 (ref 5–15)
BUN: 5 mg/dL — ABNORMAL LOW (ref 6–20)
CO2: 27 mmol/L (ref 22–32)
Calcium: 9.1 mg/dL (ref 8.9–10.3)
Chloride: 105 mmol/L (ref 98–111)
Creatinine: 0.55 mg/dL (ref 0.44–1.00)
GFR, Estimated: >60 mL/min (ref >60)
Glucose, Bld: 114 mg/dL — ABNORMAL HIGH (ref 70–99)
Potassium: 3.2 mmol/L — ABNORMAL LOW (ref 3.5–5.1)
Sodium: 142 mmol/L (ref 135–145)
Total Bilirubin: 0.2 mg/dL — ABNORMAL LOW (ref 0.3–1.2)
Total Protein: 6.8 g/dL (ref 6.5–8.1)

## 2022-03-19 MED ORDER — SODIUM CHLORIDE 0.9 % IV SOLN
150.0000 mg | Freq: Once | INTRAVENOUS | Status: AC
  Administered 2022-03-19: 150 mg via INTRAVENOUS
  Filled 2022-03-19: qty 150

## 2022-03-19 MED ORDER — LEUCOVORIN CALCIUM INJECTION 350 MG
400.0000 mg/m2 | Freq: Once | INTRAVENOUS | Status: AC
  Administered 2022-03-19: 756 mg via INTRAVENOUS
  Filled 2022-03-19: qty 35

## 2022-03-19 MED ORDER — SODIUM CHLORIDE 0.9 % IV SOLN
148.5000 mg/m2 | Freq: Once | INTRAVENOUS | Status: AC
  Administered 2022-03-19: 280 mg via INTRAVENOUS
  Filled 2022-03-19: qty 10

## 2022-03-19 MED ORDER — SODIUM CHLORIDE 0.9 % IV SOLN
2400.0000 mg/m2 | INTRAVENOUS | Status: DC
  Administered 2022-03-19: 4550 mg via INTRAVENOUS
  Filled 2022-03-19: qty 91

## 2022-03-19 MED ORDER — SODIUM CHLORIDE 0.9 % IV SOLN: Freq: Once | INTRAVENOUS | Status: DC

## 2022-03-19 MED ORDER — OXALIPLATIN CHEMO INJECTION 100 MG/20ML
76.5000 mg/m2 | Freq: Once | INTRAVENOUS | Status: AC
  Administered 2022-03-19: 145 mg via INTRAVENOUS
  Filled 2022-03-19: qty 20

## 2022-03-19 MED ORDER — ATROPINE SULFATE 1 MG/ML IV SOLN
0.5000 mg | Freq: Once | INTRAVENOUS | Status: AC | PRN
  Administered 2022-03-19: 0.5 mg via INTRAVENOUS
  Filled 2022-03-19: qty 1

## 2022-03-19 MED ORDER — DEXTROSE 5 % IV SOLN: Freq: Once | INTRAVENOUS | Status: AC

## 2022-03-19 MED ORDER — SODIUM CHLORIDE 0.9 % IV SOLN
10.0000 mg | Freq: Once | INTRAVENOUS | Status: AC
  Administered 2022-03-19: 10 mg via INTRAVENOUS
  Filled 2022-03-19: qty 10

## 2022-03-19 MED ORDER — PALONOSETRON HCL INJECTION 0.25 MG/5ML
0.2500 mg | Freq: Once | INTRAVENOUS | Status: AC
  Administered 2022-03-19: 0.25 mg via INTRAVENOUS
  Filled 2022-03-19: qty 5

## 2022-03-19 NOTE — Progress Notes (Signed)
Reached out to IR to determine if we need to move patients Y-90 appt  since it will be 5 days from Y-90 procedure, specifically because of exposure. Per Dr Ruthann Cancer, ok to proceed with chemo per his standpoint.

## 2022-03-19 NOTE — Progress Notes (Signed)
OK to hold Avastin today d/t upcoming IR procedure on 03/29/22 per order of S. Eulas Post NP.

## 2022-03-19 NOTE — Patient Instructions (Signed)
[facility]  Discharge Instructions: Thank you for choosing [facility] to provide your oncology and hematology care.   If you have a lab appointment with the [facility], please go directly to the [facility] and check in at the registration area.  Wear comfortable clothing and clothing appropriate for easy access to any Portacath or PICC line.   We strive to give you quality time with your provider. You may need to reschedule your appointment if you arrive late (15 or more minutes).  Arriving late affects you and other patients whose appointments are after yours.  Also, if you miss three or more appointments without notifying the office, you may be dismissed from the clinic at the provider's discretion.      For prescription refill requests, have your pharmacy contact our office and allow 72 hours for refills to be completed.    Today you received the following chemotherapy and/or immunotherapy agents FOLFOXIRI (Irinotecan, Oxaliplatin and 5FU)     To help prevent nausea and vomiting after your treatment, we encourage you to take your nausea medication as directed.  BELOW ARE SYMPTOMS THAT SHOULD BE REPORTED IMMEDIATELY: *FEVER GREATER THAN 100.4 F (38 C) OR HIGHER *CHILLS OR SWEATING *NAUSEA AND VOMITING THAT IS NOT CONTROLLED WITH YOUR NAUSEA MEDICATION *UNUSUAL SHORTNESS OF BREATH *UNUSUAL BRUISING OR BLEEDING *URINARY PROBLEMS (pain or burning when urinating, or frequent urination) *BOWEL PROBLEMS (unusual diarrhea, constipation, pain near the anus) TENDERNESS IN MOUTH AND THROAT WITH OR WITHOUT PRESENCE OF ULCERS (sore throat, sores in mouth, or a toothache) UNUSUAL RASH, SWELLING OR PAIN  UNUSUAL VAGINAL DISCHARGE OR ITCHING   Items with * indicate a potential emergency and should be followed up as soon as possible or go to the Emergency Department if any problems should occur.  Please show the CHEMOTHERAPY ALERT CARD or  IMMUNOTHERAPY ALERT CARD at check-in to the Emergency Department and triage nurse. Should you have questions after your visit or need to cancel or reschedule your appointment, please contact [facility]  [phone] and follow the prompts.  Office hours are 8:00 a.m. to 4:30 p.m. Monday - Friday. Please note that voicemails left after 4:00 p.m. may not be returned until the following business day.  We are closed weekends and major holidays. You have access to a nurse at all times for urgent questions. Please call the main number to the clinic [phone] and follow the prompts.  For any non-urgent questions, you may also contact your provider using MyChart. We now offer e-Visits for anyone 40 and older to request care online for non-urgent symptoms. For details visit [email or web address].   Also download the MyChart app! Go to the app store, search "MyChart", open the app, select [facility], and log in with your MyChart username and password.  Masks are optional in the cancer centers. If you would like for your care team to wear a mask while they are taking care of you, please let them know. You may have one support person who is at least 54 years old accompany you for your appointments.

## 2022-03-21 ENCOUNTER — Inpatient Hospital Stay: Payer: Self-pay

## 2022-03-21 VITALS — BP 128/68 | HR 84 | Resp 18

## 2022-03-21 DIAGNOSIS — C787 Secondary malignant neoplasm of liver and intrahepatic bile duct: Secondary | ICD-10-CM

## 2022-03-21 MED ORDER — HEPARIN SOD (PORK) LOCK FLUSH 100 UNIT/ML IV SOLN
500.0000 [IU] | Freq: Once | INTRAVENOUS | Status: AC | PRN
  Administered 2022-03-21: 500 [IU]

## 2022-03-21 MED ORDER — PEGFILGRASTIM-CBQV 6 MG/0.6ML ~~LOC~~ SOSY: 6.0000 mg | PREFILLED_SYRINGE | Freq: Once | SUBCUTANEOUS | Status: DC

## 2022-03-21 MED ORDER — SODIUM CHLORIDE 0.9% FLUSH
10.0000 mL | INTRAVENOUS | Status: DC | PRN
  Administered 2022-03-21: 10 mL

## 2022-03-21 NOTE — Progress Notes (Signed)
WBC 6.4 10/23 VO pt does not need injection per NP.

## 2022-03-21 NOTE — Patient Instructions (Signed)

## 2022-03-22 ENCOUNTER — Other Ambulatory Visit: Payer: Self-pay

## 2022-03-28 ENCOUNTER — Other Ambulatory Visit (HOSPITAL_COMMUNITY): Payer: Self-pay | Admitting: Physician Assistant

## 2022-03-28 ENCOUNTER — Inpatient Hospital Stay: Payer: Medicaid Other | Attending: Hematology & Oncology | Admitting: Licensed Clinical Social Worker

## 2022-03-28 ENCOUNTER — Other Ambulatory Visit: Payer: Self-pay | Admitting: Internal Medicine

## 2022-03-28 DIAGNOSIS — Z5111 Encounter for antineoplastic chemotherapy: Secondary | ICD-10-CM | POA: Insufficient documentation

## 2022-03-28 DIAGNOSIS — C182 Malignant neoplasm of ascending colon: Secondary | ICD-10-CM | POA: Insufficient documentation

## 2022-03-28 DIAGNOSIS — K5900 Constipation, unspecified: Secondary | ICD-10-CM | POA: Insufficient documentation

## 2022-03-28 DIAGNOSIS — Z452 Encounter for adjustment and management of vascular access device: Secondary | ICD-10-CM | POA: Insufficient documentation

## 2022-03-28 DIAGNOSIS — C787 Secondary malignant neoplasm of liver and intrahepatic bile duct: Secondary | ICD-10-CM | POA: Insufficient documentation

## 2022-03-28 DIAGNOSIS — G629 Polyneuropathy, unspecified: Secondary | ICD-10-CM | POA: Insufficient documentation

## 2022-03-28 DIAGNOSIS — Z5112 Encounter for antineoplastic immunotherapy: Secondary | ICD-10-CM | POA: Insufficient documentation

## 2022-03-28 DIAGNOSIS — Z01818 Encounter for other preprocedural examination: Secondary | ICD-10-CM

## 2022-03-28 NOTE — Progress Notes (Signed)
Rebecca Bullock CSW Progress Note  Clinical Education officer, museum contacted patient by phone to needs.  Patient is hopeful about her Y90 procedure tomorrow.  She is not working and having to rest more.  She received notification that she will soon be receiving disability.  She has also been approved to move to income based housing.  Vanity said her church helps her financially and is of great support.  She is still struggling to pay bills at the end of the month.  Informed her that CSW will leave a [place] for her at the front desk for her.  She expressed no other needs at this time.    [name] Welton Bord, LCSW

## 2022-03-29 ENCOUNTER — Ambulatory Visit (HOSPITAL_COMMUNITY)
Admission: RE | Admit: 2022-03-29 | Discharge: 2022-03-29 | Disposition: A | Discharge status: Home / Self Care | Payer: Medicaid Other | Source: Ambulatory Visit | Attending: Interventional Radiology | Admitting: Interventional Radiology

## 2022-03-29 ENCOUNTER — Other Ambulatory Visit (HOSPITAL_COMMUNITY): Payer: Self-pay | Admitting: Interventional Radiology

## 2022-03-29 ENCOUNTER — Encounter (HOSPITAL_COMMUNITY): Payer: Self-pay

## 2022-03-29 ENCOUNTER — Encounter: Payer: Self-pay | Admitting: *Deleted

## 2022-03-29 ENCOUNTER — Encounter (HOSPITAL_COMMUNITY)
Admission: RE | Admit: 2022-03-29 | Discharge: 2022-03-29 | Disposition: A | Discharge status: Home / Self Care | Payer: Medicaid Other | Source: Ambulatory Visit | Attending: Interventional Radiology | Admitting: Interventional Radiology

## 2022-03-29 ENCOUNTER — Other Ambulatory Visit: Payer: Self-pay

## 2022-03-29 DIAGNOSIS — C189 Malignant neoplasm of colon, unspecified: Secondary | ICD-10-CM

## 2022-03-29 DIAGNOSIS — C787 Secondary malignant neoplasm of liver and intrahepatic bile duct: Secondary | ICD-10-CM | POA: Diagnosis not present

## 2022-03-29 DIAGNOSIS — Z9221 Personal history of antineoplastic chemotherapy: Secondary | ICD-10-CM | POA: Insufficient documentation

## 2022-03-29 DIAGNOSIS — Z01818 Encounter for other preprocedural examination: Secondary | ICD-10-CM

## 2022-03-29 HISTORY — PX: IR US GUIDE VASC ACCESS RIGHT: IMG2390

## 2022-03-29 HISTORY — PX: IR ANGIOGRAM VISCERAL SELECTIVE: IMG657

## 2022-03-29 HISTORY — PX: IR EMBO TUMOR ORGAN ISCHEMIA INFARCT INC GUIDE ROADMAPPING: IMG5449

## 2022-03-29 HISTORY — PX: IR ANGIOGRAM SELECTIVE EACH ADDITIONAL VESSEL: IMG667

## 2022-03-29 LAB — COMPREHENSIVE METABOLIC PANEL
ALT: 16 U/L (ref 0–44)
AST: 17 U/L (ref 15–41)
Albumin: 3.4 g/dL — ABNORMAL LOW (ref 3.5–5.0)
Alkaline Phosphatase: 101 U/L (ref 38–126)
Anion gap: 7 (ref 5–15)
BUN: 9 mg/dL (ref 6–20)
CO2: 25 mmol/L (ref 22–32)
Calcium: 8.5 mg/dL — ABNORMAL LOW (ref 8.9–10.3)
Chloride: 109 mmol/L (ref 98–111)
Creatinine, Ser: 0.42 mg/dL — ABNORMAL LOW (ref 0.44–1.00)
GFR, Estimated: >60 mL/min (ref >60)
Glucose, Bld: 104 mg/dL — ABNORMAL HIGH (ref 70–99)
Potassium: 3.3 mmol/L — ABNORMAL LOW (ref 3.5–5.1)
Sodium: 141 mmol/L (ref 135–145)
Total Bilirubin: 0.4 mg/dL (ref 0.3–1.2)
Total Protein: 6.6 g/dL (ref 6.5–8.1)

## 2022-03-29 LAB — CBC WITH DIFFERENTIAL/PLATELET
Abs Immature Granulocytes: 0.01 10*3/uL (ref 0.00–0.07)
Basophils Absolute: 0 10*3/uL (ref 0.0–0.1)
Basophils Relative: 0 %
Eosinophils Absolute: 0 10*3/uL (ref 0.0–0.5)
Eosinophils Relative: 1 %
HCT: 31.9 % — ABNORMAL LOW (ref 36.0–46.0)
Hemoglobin: 10.3 g/dL — ABNORMAL LOW (ref 12.0–15.0)
Immature Granulocytes: 0 %
Lymphocytes Relative: 30 %
Lymphs Abs: 0.7 10*3/uL (ref 0.7–4.0)
MCH: 29 pg (ref 26.0–34.0)
MCHC: 32.3 g/dL (ref 30.0–36.0)
MCV: 89.9 fL (ref 80.0–100.0)
Monocytes Absolute: 0.3 10*3/uL (ref 0.1–1.0)
Monocytes Relative: 12 %
Neutro Abs: 1.3 10*3/uL — ABNORMAL LOW (ref 1.7–7.7)
Neutrophils Relative %: 57 %
Platelets: 128 10*3/uL — ABNORMAL LOW (ref 150–400)
RBC: 3.55 MIL/uL — ABNORMAL LOW (ref 3.87–5.11)
RDW: 16.4 % — ABNORMAL HIGH (ref 11.5–15.5)
WBC: 2.4 10*3/uL — ABNORMAL LOW (ref 4.0–10.5)
nRBC: 0 % (ref 0.0–0.2)

## 2022-03-29 LAB — PROTIME-INR
INR: 1 (ref 0.8–1.2)
Prothrombin Time: 12.9 seconds (ref 11.4–15.2)

## 2022-03-29 MED ORDER — DEXAMETHASONE SODIUM PHOSPHATE 10 MG/ML IJ SOLN
8.0000 mg | INTRAMUSCULAR | Status: AC
  Administered 2022-03-29: 8 mg via INTRAVENOUS
  Filled 2022-03-29: qty 1

## 2022-03-29 MED ORDER — PIPERACILLIN-TAZOBACTAM 3.375 G IVPB 30 MIN
3.3750 g | INTRAVENOUS | Status: AC
  Administered 2022-03-29: 3.375 g via INTRAVENOUS
  Filled 2022-03-29: qty 50

## 2022-03-29 MED ORDER — YTTRIUM 90 INJECTION
28.3000 | INJECTION | Freq: Once | INTRAVENOUS | Status: AC
  Administered 2022-03-29: 28.3 via INTRA_ARTERIAL

## 2022-03-29 MED ORDER — FLUMAZENIL 0.5 MG/5ML IV SOLN
INTRAVENOUS | Status: AC
  Filled 2022-03-29: qty 5

## 2022-03-29 MED ORDER — SODIUM CHLORIDE 0.9 % IV SOLN
8.0000 mg | INTRAVENOUS | Status: AC
  Administered 2022-03-29: 8 mg via INTRAVENOUS
  Filled 2022-03-29: qty 4

## 2022-03-29 MED ORDER — NALOXONE HCL 0.4 MG/ML IJ SOLN
INTRAMUSCULAR | Status: AC
  Filled 2022-03-29: qty 1

## 2022-03-29 MED ORDER — FENTANYL CITRATE (PF) 100 MCG/2ML IJ SOLN
INTRAMUSCULAR | Status: AC | PRN
  Administered 2022-03-29: 25 ug via INTRAVENOUS
  Administered 2022-03-29: 50 ug via INTRAVENOUS
  Administered 2022-03-29 (×3): 25 ug via INTRAVENOUS

## 2022-03-29 MED ORDER — SODIUM CHLORIDE 0.9 % IV SOLN: INTRAVENOUS | Status: DC

## 2022-03-29 MED ORDER — IOHEXOL 300 MG/ML  SOLN
50.0000 mL | Freq: Once | INTRAMUSCULAR | Status: AC | PRN
  Administered 2022-03-29: 25 mL via INTRA_ARTERIAL

## 2022-03-29 MED ORDER — PANTOPRAZOLE SODIUM 40 MG IV SOLR
40.0000 mg | INTRAVENOUS | Status: AC
  Administered 2022-03-29: 40 mg via INTRAVENOUS
  Filled 2022-03-29: qty 10

## 2022-03-29 MED ORDER — LIDOCAINE HCL 1 % IJ SOLN
INTRAMUSCULAR | Status: AC
  Filled 2022-03-29: qty 20

## 2022-03-29 MED ORDER — IOHEXOL 300 MG/ML  SOLN
100.0000 mL | Freq: Once | INTRAMUSCULAR | Status: AC | PRN
  Administered 2022-03-29: 50 mL via INTRA_ARTERIAL

## 2022-03-29 MED ORDER — HEPARIN SOD (PORK) LOCK FLUSH 100 UNIT/ML IV SOLN
INTRAVENOUS | Status: AC
  Administered 2022-03-29: 500 [IU]
  Filled 2022-03-29: qty 5

## 2022-03-29 MED ORDER — FENTANYL CITRATE (PF) 100 MCG/2ML IJ SOLN
INTRAMUSCULAR | Status: AC
  Filled 2022-03-29: qty 4

## 2022-03-29 MED ORDER — MIDAZOLAM HCL 2 MG/2ML IJ SOLN
INTRAMUSCULAR | Status: AC
  Filled 2022-03-29: qty 4

## 2022-03-29 MED ORDER — MIDAZOLAM HCL 2 MG/2ML IJ SOLN
INTRAMUSCULAR | Status: AC | PRN
  Administered 2022-03-29: .5 mg via INTRAVENOUS
  Administered 2022-03-29: 1 mg via INTRAVENOUS
  Administered 2022-03-29: .5 mg via INTRAVENOUS

## 2022-03-29 NOTE — Progress Notes (Signed)
Bevacizumab biosimilar changed from [place] to [place]. [employer] has patient assistance plan available and patient qualifies.

## 2022-03-29 NOTE — Discharge Instructions (Signed)
Discharge Instructions:   Please call Interventional Radiology clinic [phone] with any questions or concerns.  You may remove your dressing (Bandaid) and shower tomorrow.   Moderate Conscious Sedation, Adult, Care After This sheet gives you information about how to care for yourself after your procedure. Your health care provider may also give you more specific instructions. If you have problems or questions, contact your health care provider. What can I expect after the procedure? After the procedure, it is common to have: Sleepiness for several hours. Impaired judgment for several hours. Difficulty with balance. Vomiting if you eat too soon. Follow these instructions at home: For the time period you were told by your health care provider: Rest. Do not participate in activities where you could fall or become injured. Do not drive or use machinery. Do not drink alcohol. Do not take sleeping pills or medicines that cause drowsiness. Do not make important decisions or sign legal documents. Do not take care of children on your own. Eating and drinking  Follow the diet recommended by your health care provider. Drink enough fluid to keep your urine pale yellow. If you vomit: Drink water, juice, or soup when you can drink without vomiting. Make sure you have little or no nausea before eating solid foods. General instructions Take over-the-counter and prescription medicines only as told by your health care provider. Have a responsible adult stay with you for the time you are told. It is important to have someone help care for you until you are awake and alert. Do not smoke. Keep all follow-up visits as told by your health care provider. This is important. Contact a health care provider if: You are still sleepy or having trouble with balance after 24 hours. You feel light-headed. You keep feeling nauseous or you keep vomiting. You develop a rash. You have a fever. You have  redness or swelling around the IV site. Get help right away if: You have trouble breathing. You have new-onset confusion at home. Summary After the procedure, it is common to feel sleepy, have impaired judgment, or feel nauseous if you eat too soon. Rest after you get home. Know the things you should not do after the procedure. Follow the diet recommended by your health care provider and drink enough fluid to keep your urine pale yellow. Get help right away if you have trouble breathing or new-onset confusion at home. This information is not intended to replace advice given to you by your health care provider. Make sure you discuss any questions you have with your health care provider. Document Revised: 09/11/2019 Document Reviewed: 04/09/2019 Elsevier Patient Education  [employer].

## 2022-03-29 NOTE — H&P (Signed)
Chief Complaint: Patient was seen in consultation today for Y-90 radioembolization  Referring Physician(s): [place] Physician: Ruthann Cancer  Patient Status: [facility] - Out-pt  History of Present Illness: Rebecca Bullock is a 56 y.o. female with a medical history significant for metastatic colon cancer. She was sent to the ED 03/14/21 by her PCP when lab work showed a hemoglobin of 5.3. She was admitted for treatment and was discharged with outpatient GI follow up. An 04/18/21 colonoscopy identified a mass in the ascending colon with pathology positive for adenocarcinoma. Additional imaging showed liver and lymph node metastases. She was seen in IR 05/11/21 for port-a-catheter placement.   She has received numerous rounds of chemotherapy. There had been plans to have a colectomy with possible hepatectomy in September at [facility]. Unfortunately this was cancelled due to perceived enlargement of the liver mass and concern for lymphadenopathy. She was referred to Interventional Radiology to discuss liver-directed treatments options and she met with Dr. Serafina Royals via tele-health visit on 02/15/22. Given the location of the tumor and size, a transarterial radioembolization was recommended. The risks and benefits of this procedure were discussed and the patient was agreeable to proceed.   She was here a couple weeks ago for pre-Y90 maping and test dosing.  She presents today to the [facility] department for Y-90 radioembolization.  Pt feels well today, no fevers, chills, N/V  Past Medical History:  Diagnosis Date   Colon cancer metastasized to liver ([place]) 05/05/2021   Family history of lung cancer    Family history of pancreatic cancer    Goals of care, counseling/discussion 05/05/2021    Past Surgical History:  Procedure Laterality Date   IR IMAGING GUIDED PORT INSERTION  05/11/2021   IR RADIOLOGIST EVAL & MGMT  02/15/2022   uterine ablation       Allergies: Augmentin [amoxicillin-pot clavulanate], Irinotecan, Metronidazole, Vancomycin, and Tape  Medications: Prior to Admission medications   Medication Sig Start Date End Date Taking? Authorizing Provider  Acetaminophen 325 MG CAPS Take 650 mg by mouth every 8 (eight) hours as needed for moderate pain or headache. Patient not taking: Reported on 01/10/2022    [provider]  betamethasone valerate (VALISONE) 0.1 % cream Apply 1 application  topically See admin instructions. Apply 1 application topically every other day as needed for psoriasis flare Patient not taking: Reported on 01/10/2022 11/11/20   [provider]  cyclobenzaprine (FLEXERIL) 10 MG tablet Take 1 tablet (10 mg total) by mouth 3 (three) times daily as needed for muscle spasms. 02/19/22   [name], MD  dexamethasone (DECADRON) 4 MG tablet Take 2 tablets (8 mg total) by mouth daily. Start the day after chemotherapy for 3 days. Take with food. Patient not taking: Reported on 12/20/2021 11/06/21   [name], MD  diphenoxylate-atropine (LOMOTIL) 2.5-0.025 MG tablet Take 2 tablets by mouth 4 (four) times daily as needed for diarrhea or loose stools. Patient not taking: Reported on 01/10/2022 11/22/21   [name], MD  encorafenib (BRAFTOVI) 75 MG capsule Take 4 capsules (300 mg total) by mouth daily. Patient not taking: Reported on 12/20/2021 06/01/21   [name], MD  erythromycin base (E-MYCIN) 500 MG tablet Take two tablets by mouth at 8:00pm, 9:00pm and 11:00pm the night before surgery. 02/06/22     estradiol (ESTRACE) 0.1 MG/GM vaginal cream Place 1 Applicatorful vaginally daily as needed (dryness / irritation). Patient not taking: Reported on 01/10/2022 11/11/20   [provider]  fluticasone (FLONASE) 50 MCG/ACT nasal spray Place 2 sprays into both nostrils daily. Patient not taking: Reported on 01/10/2022 09/18/16   Emeterio Reeve, DO  HYDROcodone-acetaminophen  (NORCO/VICODIN) 5-325 MG tablet Take 1 tablet by mouth every 4 (four) hours as needed for moderate pain. Patient not taking: Reported on 10/31/2021 08/23/21   [name], MD  hydrocortisone 1 % lotion Apply 1 application topically 2 (two) times daily as needed for itching. Patient not taking: Reported on 01/10/2022 06/26/21   [name], MD  LORazepam (ATIVAN) 0.5 MG tablet Take 1 tablet (0.5 mg total) by mouth every 6 (six) hours as needed for anxiety. Patient not taking: Reported on 01/10/2022 11/13/21   [name], MD  neomycin (MYCIFRADIN) 500 MG tablet Take two tablets by mouth at 8:00pm, 9:00pm and 11:00pm the night before surgery. 02/06/22     omeprazole (PRILOSEC OTC) 20 MG tablet Take 20 mg by mouth daily as needed (acid reflux). Patient not taking: Reported on 01/10/2022    [provider]  ondansetron (ZOFRAN) 8 MG tablet Take 1 tablet (8 mg total) by mouth 2 (two) times daily as needed. Start on day 3 after chemotherapy. 11/06/21   [name], MD  prochlorperazine (COMPAZINE) 10 MG tablet Take 1 tablet (10 mg total) by mouth every 6 (six) hours as needed (Nausea or vomiting). 11/06/21   [name], MD  traMADol (ULTRAM) 50 MG tablet Take 1 tablet (50 mg total) by mouth every 6 (six) hours as needed. 02/19/22   [name], MD     Family History  Problem Relation Age of Onset   Lung cancer Mother    Diabetes Maternal Grandmother    Pancreatic cancer Maternal Grandfather    Multiple sclerosis Maternal Aunt    Alcohol abuse Maternal Uncle     Social History   Socioeconomic History   Marital status: Single    Spouse name: Not on file   Number of children: Not on file   Years of education: Not on file   Highest education level: Not on file  Occupational History   Not on file  Tobacco Use   Smoking status: Never   Smokeless tobacco: Never  Vaping Use   Vaping Use: Never used  Substance and Sexual Activity   Alcohol use: Yes    Comment:  occasionally   Drug use: Not Currently   Sexual activity: Not on file  Other Topics Concern   Not on file  Social History Narrative   Not on file   Social Determinants of Health   Financial Resource Strain: Medium Risk (05/18/2021)   Overall Financial Resource Strain (CARDIA)    Difficulty of Paying Living Expenses: Somewhat hard  Food Insecurity: No Food Insecurity (05/18/2021)   Hunger Vital Sign    Worried About Running Out of Food in the Last Year: Never true    Ran Out of Food in the Last Year: Never true  Transportation Needs: No Transportation Needs (05/18/2021)   PRAPARE - Hydrologist (Medical): No    Lack of Transportation (Non-Medical): No  Physical Activity: Not on file  Stress: Not on file  Social Connections: Not on file    Review of Systems: A 12 point ROS discussed and pertinent positives are indicated in the HPI above.  All other systems are negative.  Review of Systems  Constitutional:  Negative for appetite change and fatigue.  Respiratory:  Negative for cough and  shortness of breath.   Cardiovascular:  Negative for chest pain and leg swelling.  Gastrointestinal:  Positive for diarrhea. Negative for abdominal pain, nausea and vomiting.  Neurological:  Negative for dizziness and headaches.    Vital Signs: BP 125/82   Pulse 67   Temp 97.6 F (36.4 C) (Oral)   Resp 16   Ht _0  (1.549 m)   Wt 182 lb (82.6 kg)   SpO2 100%   BMI 34.39 kg/m   Physical Exam Constitutional:      General: She is not in acute distress.    Appearance: She is not ill-appearing.  HENT:     Mouth/Throat:     Mouth: Mucous membranes are moist.     Pharynx: Oropharynx is clear.  Cardiovascular:     Rate and Rhythm: Normal rate and regular rhythm.     Pulses: Normal pulses.     Heart sounds: Normal heart sounds.  Pulmonary:     Effort: Pulmonary effort is normal.  Abdominal:     Tenderness: There is no abdominal tenderness.   Musculoskeletal:     Right lower leg: No edema.     Left lower leg: No edema.  Skin:    General: Skin is warm and dry.  Neurological:     Mental Status: She is alert and oriented to person, place, and time.  Psychiatric:        Mood and Affect: Mood normal.        Thought Content: Thought content normal.    Labs:  CBC: Recent Labs    01/31/22 0818 02/15/22 0840 03/05/22 0925 03/15/22 0801  WBC 4.3 6.4 3.3* 7.2  HGB 11.5* 11.4* 11.4* 11.1*  HCT 36.2 36.2 35.4* 35.8*  PLT 159 200 177 121*    COAGS: Recent Labs    06/19/21 2331 03/15/22 0801  INR 1.0 1.0  APTT 26  --     BMP: Recent Labs    01/31/22 0818 02/15/22 0840 03/05/22 0925 03/15/22 0801  NA 139 138 140 140  K 3.9 3.6 3.7 3.5  CL 105 105 104 110  CO2 _1 GLUCOSE 94 147* 98 95  BUN _2 CALCIUM 9.4 8.7* 9.3 8.7*  CREATININE 0.55 0.63 0.55 0.55  GFRNONAA >60 >60 >60 >60    LIVER FUNCTION TESTS: Recent Labs    01/31/22 0818 02/15/22 0840 03/05/22 0925 03/15/22 0801  BILITOT 0.3 0.2* 0.3 0.2*  AST 14* _3 ALT _4 ALKPHOS 107 144* 140* 118  PROT 6.8 7.3 7.2 6.6  ALBUMIN 3.9 3.3* 4.0 3.4*    Assessment and Plan:  Colon cancer with liver metastases: Rebecca Bullock, 56 year old female, presents today to the [facility] department for an image-guided hepatic Y-90 radioembolization  Risks and benefits discussed with the patient including, but not limited to bleeding, infection, vascular injury, post procedural pain, nausea, vomiting and fatigue, contrast induced renal failure, liver failure, radiation injury to the bowel, radiation induced cholecystitis, neutropenia and possible need for additional procedures.  All of the patient's questions were answered, patient is agreeable to proceed. She has been NPO.   Consent signed and in chart.  Thank you for this interesting consult.  I greatly enjoyed meeting Rebecca Bullock and look forward to  participating in their care.  A copy of this report was sent to the requesting provider on this date.  Electronically Signed: Ascencion Dike PA-C Interventional Radiology 03/29/2022 8:37 AM  I spent a total of  30 Minutes   in face to face in clinical consultation, greater than 50% of which was counseling/coordinating care for Y90 radioembolization

## 2022-03-29 NOTE — Procedures (Signed)
Interventional Radiology Procedure Note  Procedure:  Right superior lobar transhepatic radioembolization  Findings: Please refer to procedural dictation for full description. R CFA access, 6 Fr angioseal closure.  Complications: None immediate  Estimated Blood Loss: < 5 mL  Recommendations: Strict 4 hour bedrest.  Head of bed flat until 12:15, up to 30 degrees until 14:15. Follow up in IR clinic in 2-3 weeks. Plan for repeat MRI in 3 months.   Ruthann Cancer, MD Pager: [phone]

## 2022-03-29 NOTE — Sedation Documentation (Signed)
Patient here for Y-90. Very pleasant, A-O x 4. States that she was very comfortable, sedation wise during her Y-90 mapping a few weeks ago. Pedal pulses 2+ DP and 1+ PT. NPO since HS last night. All questions answered from a sedation/nursing standpoint.

## 2022-03-29 NOTE — Sedation Documentation (Signed)
Y-90 procedure completed tolerated well. Right groins site Angioseal intact, no drng or hematoma noted. To Nuc Med for scanning. Monitoring continues with RN ar bedside.

## 2022-03-29 NOTE — Progress Notes (Signed)
Patient completed her right superior lobar transhepatic radioembolization with Y90.  Oncology Nurse Navigator Documentation     03/29/2022   11:15 AM  Oncology Nurse Navigator Flowsheets  Navigator Follow Up Date: 04/03/2022  Navigator Follow Up Reason: Follow-up Appointment  Navigator Location [facility]  Navigator Encounter Type Appt/Treatment Plan Review  Patient Visit Type MedOnc  Treatment Phase Active Tx  Barriers/Navigation Needs Coordination of Care;Education  Interventions None Required  Acuity Level 2-Minimal Needs (1-2 Barriers Identified)  Support Groups/Services Friends and Family  Time Spent with Patient 15

## 2022-03-31 ENCOUNTER — Encounter: Payer: Self-pay | Admitting: Hematology & Oncology

## 2022-03-31 ENCOUNTER — Encounter: Payer: Self-pay | Admitting: Family

## 2022-04-03 ENCOUNTER — Inpatient Hospital Stay: Payer: Medicaid Other

## 2022-04-03 ENCOUNTER — Encounter: Payer: Self-pay | Admitting: *Deleted

## 2022-04-03 ENCOUNTER — Inpatient Hospital Stay (HOSPITAL_BASED_OUTPATIENT_CLINIC_OR_DEPARTMENT_OTHER): Payer: Medicaid Other | Admitting: Hematology & Oncology

## 2022-04-03 ENCOUNTER — Encounter: Payer: Self-pay | Admitting: Hematology & Oncology

## 2022-04-03 VITALS — BP 118/66 | HR 89 | Temp 97.7°F | Resp 17 | Ht 61.0 in | Wt 184.4 lb

## 2022-04-03 DIAGNOSIS — C182 Malignant neoplasm of ascending colon: Secondary | ICD-10-CM | POA: Diagnosis present

## 2022-04-03 DIAGNOSIS — Z452 Encounter for adjustment and management of vascular access device: Secondary | ICD-10-CM | POA: Diagnosis not present

## 2022-04-03 DIAGNOSIS — C189 Malignant neoplasm of colon, unspecified: Secondary | ICD-10-CM | POA: Diagnosis not present

## 2022-04-03 DIAGNOSIS — C787 Secondary malignant neoplasm of liver and intrahepatic bile duct: Secondary | ICD-10-CM | POA: Diagnosis not present

## 2022-04-03 DIAGNOSIS — G629 Polyneuropathy, unspecified: Secondary | ICD-10-CM | POA: Diagnosis not present

## 2022-04-03 DIAGNOSIS — Z5112 Encounter for antineoplastic immunotherapy: Secondary | ICD-10-CM | POA: Diagnosis present

## 2022-04-03 DIAGNOSIS — K5900 Constipation, unspecified: Secondary | ICD-10-CM | POA: Diagnosis not present

## 2022-04-03 DIAGNOSIS — Z5111 Encounter for antineoplastic chemotherapy: Secondary | ICD-10-CM | POA: Diagnosis present

## 2022-04-03 LAB — CBC WITH DIFFERENTIAL (CANCER CENTER ONLY)
Abs Immature Granulocytes: 0.01 10*3/uL (ref 0.00–0.07)
Basophils Absolute: 0 10*3/uL (ref 0.0–0.1)
Basophils Relative: 1 %
Eosinophils Absolute: 0.1 10*3/uL (ref 0.0–0.5)
Eosinophils Relative: 4 %
HCT: 32.7 % — ABNORMAL LOW (ref 36.0–46.0)
Hemoglobin: 10.5 g/dL — ABNORMAL LOW (ref 12.0–15.0)
Immature Granulocytes: 1 %
Lymphocytes Relative: 31 %
Lymphs Abs: 0.6 10*3/uL — ABNORMAL LOW (ref 0.7–4.0)
MCH: 28.8 pg (ref 26.0–34.0)
MCHC: 32.1 g/dL (ref 30.0–36.0)
MCV: 89.6 fL (ref 80.0–100.0)
Monocytes Absolute: 0.4 10*3/uL (ref 0.1–1.0)
Monocytes Relative: 22 %
Neutro Abs: 0.8 10*3/uL — ABNORMAL LOW (ref 1.7–7.7)
Neutrophils Relative %: 41 %
Platelet Count: 134 10*3/uL — ABNORMAL LOW (ref 150–400)
RBC: 3.65 MIL/uL — ABNORMAL LOW (ref 3.87–5.11)
RDW: 17.1 % — ABNORMAL HIGH (ref 11.5–15.5)
WBC Count: 2 10*3/uL — ABNORMAL LOW (ref 4.0–10.5)
nRBC: 0 % (ref 0.0–0.2)

## 2022-04-03 LAB — CMP (CANCER CENTER ONLY)
ALT: 20 U/L (ref 0–44)
AST: 20 U/L (ref 15–41)
Albumin: 4.1 g/dL (ref 3.5–5.0)
Alkaline Phosphatase: 122 U/L (ref 38–126)
Anion gap: 9 (ref 5–15)
BUN: 7 mg/dL (ref 6–20)
CO2: 27 mmol/L (ref 22–32)
Calcium: 9.3 mg/dL (ref 8.9–10.3)
Chloride: 106 mmol/L (ref 98–111)
Creatinine: 0.49 mg/dL (ref 0.44–1.00)
GFR, Estimated: >60 mL/min (ref >60)
Glucose, Bld: 116 mg/dL — ABNORMAL HIGH (ref 70–99)
Potassium: 3.5 mmol/L (ref 3.5–5.1)
Sodium: 142 mmol/L (ref 135–145)
Total Bilirubin: 0.3 mg/dL (ref 0.3–1.2)
Total Protein: 7.3 g/dL (ref 6.5–8.1)

## 2022-04-03 LAB — CEA (IN HOUSE-CHCC): CEA (CHCC-In House): 11.77 ng/mL — ABNORMAL HIGH (ref 0.00–5.00)

## 2022-04-03 LAB — IRON AND IRON BINDING CAPACITY (CC-WL,HP ONLY)
Iron: 39 ug/dL (ref 28–170)
Saturation Ratios: 12 % (ref 10.4–31.8)
TIBC: 339 ug/dL (ref 250–450)
UIBC: 300 ug/dL (ref 148–442)

## 2022-04-03 LAB — FERRITIN: Ferritin: 131 ng/mL (ref 11–307)

## 2022-04-03 MED ORDER — ATROPINE SULFATE 1 MG/ML IV SOLN
INTRAVENOUS | Status: AC
  Filled 2022-04-03: qty 1

## 2022-04-03 MED ORDER — ATROPINE SULFATE 1 MG/ML IV SOLN
0.5000 mg | Freq: Once | INTRAVENOUS | Status: AC | PRN
  Administered 2022-04-03: 0.5 mg via INTRAVENOUS

## 2022-04-03 MED ORDER — LEUCOVORIN CALCIUM INJECTION 350 MG
400.0000 mg/m2 | Freq: Once | INTRAVENOUS | Status: AC
  Administered 2022-04-03: 756 mg via INTRAVENOUS
  Filled 2022-04-03: qty 37.8

## 2022-04-03 MED ORDER — SODIUM CHLORIDE 0.9 % IV SOLN
2400.0000 mg/m2 | INTRAVENOUS | Status: DC
  Administered 2022-04-03: 4550 mg via INTRAVENOUS
  Filled 2022-04-03: qty 91

## 2022-04-03 MED ORDER — PALONOSETRON HCL INJECTION 0.25 MG/5ML
0.2500 mg | Freq: Once | INTRAVENOUS | Status: AC
  Administered 2022-04-03: 0.25 mg via INTRAVENOUS
  Filled 2022-04-03: qty 5

## 2022-04-03 MED ORDER — HEPARIN SOD (PORK) LOCK FLUSH 100 UNIT/ML IV SOLN: 500.0000 [IU] | Freq: Once | INTRAVENOUS | Status: DC | PRN

## 2022-04-03 MED ORDER — SODIUM CHLORIDE 0.9 % IV SOLN
10.0000 mg | Freq: Once | INTRAVENOUS | Status: AC
  Administered 2022-04-03: 10 mg via INTRAVENOUS
  Filled 2022-04-03: qty 10

## 2022-04-03 MED ORDER — SODIUM CHLORIDE 0.9% FLUSH: 10.0000 mL | INTRAVENOUS | Status: DC | PRN

## 2022-04-03 MED ORDER — SODIUM CHLORIDE 0.9 % IV SOLN: Freq: Once | INTRAVENOUS | Status: AC

## 2022-04-03 MED ORDER — SODIUM CHLORIDE 0.9 % IV SOLN
150.0000 mg | Freq: Once | INTRAVENOUS | Status: AC
  Administered 2022-04-03: 150 mg via INTRAVENOUS
  Filled 2022-04-03: qty 150

## 2022-04-03 MED ORDER — SODIUM CHLORIDE 0.9 % IV SOLN
148.5000 mg/m2 | Freq: Once | INTRAVENOUS | Status: AC
  Administered 2022-04-03: 280 mg via INTRAVENOUS
  Filled 2022-04-03: qty 10

## 2022-04-03 NOTE — Patient Instructions (Signed)

## 2022-04-03 NOTE — Progress Notes (Signed)
Ok to treat with ANC 0.8 per Dr. [name]

## 2022-04-03 NOTE — Progress Notes (Signed)
Hematology and Oncology Follow Up Visit  Rebecca Bullock [id number] 09/02/1965 56 y.o. 04/03/2022   Principle Diagnosis:  Metastatic colorectal cancer-liver metastasis- BRAF (+)   Current Therapy:        Status post cycle 1 of chemotherapy with FOLFOXIRI/Avastin Encorafenib/Vectibix -- started on 06/19/2021, s/p cycle #6 -- d/c on 10/31/2021 FOLFOXIRI/Avastin-- s/p cycle  #3-- start on 02/19/2022, -oxaliplatin       dropped on 04/03/2022 IV iron-Ferrlecit given on 01/12/2022               Interim History:  Rebecca Bullock is here today for follow-up.  So happy that she did have the  Y-90 procedure last week.  This was done on 03/29/2022.  She tolerated this quite well.  She is now a new grandmother.  Her grandson was born on October 9.  She is quite happy about this.  Unfortunately, she is having some complications from the oxaliplatin.  She is having some more neuropathy.  She is having some problems swallowing.  Given this, we will drop the oxaliplatin.  This could certainly affect how effective the chemotherapy is.  However, I do not want to see her have permanent problems from toxicity.  She had the Y-90 procedure last week.  I will not give her Avastin this week.  She had a last CEA level of 9.8.  This is lower.  She has had no problems with bowels or bladder.  There is been no bleeding.  She has had no nausea or vomiting.  Again she is having little bit of a dyne aphasia from the oxaliplatin.  She is having no leg swelling.  She is having no rashes.  There is been no bruising.  Overall, I would have to say that her performance status is probably ECOG 1.     Medications:  Allergies as of 04/03/2022       Reactions   Augmentin [amoxicillin-pot Clavulanate] Diarrhea   Extreme diarrhea, abdominal pain.   Irinotecan Other (See Comments)   Flushing, tingling, visual disturbance   Metronidazole Diarrhea   Vancomycin Rash   ?red syndrome   Tape Rash        Medication List         Accurate as of April 03, 2022 10:58 AM. If you have any questions, ask your nurse or doctor.          Acetaminophen 325 MG Caps Take 650 mg by mouth every 8 (eight) hours as needed for moderate pain or headache.   betamethasone valerate 0.1 % cream Commonly known as: VALISONE Apply 1 application  topically See admin instructions. Apply 1 application topically every other day as needed for psoriasis flare   cyclobenzaprine 10 MG tablet Commonly known as: FLEXERIL Take 1 tablet (10 mg total) by mouth 3 (three) times daily as needed for muscle spasms.   dexamethasone 4 MG tablet Commonly known as: DECADRON Take 2 tablets (8 mg total) by mouth daily. Start the day after chemotherapy for 3 days. Take with food.   diphenoxylate-atropine 2.5-0.025 MG tablet Commonly known as: LOMOTIL Take 2 tablets by mouth 4 (four) times daily as needed for diarrhea or loose stools.   encorafenib 75 MG capsule Commonly known as: BRAFTOVI Take 4 capsules (300 mg total) by mouth daily.   erythromycin base 500 MG tablet Commonly known as: E-MYCIN Take two tablets by mouth at 8:00pm, 9:00pm and 11:00pm the night before surgery.   estradiol 0.1 MG/GM vaginal cream Commonly known as: ESTRACE Place 1  Applicatorful vaginally daily as needed (dryness / irritation).   fluticasone 50 MCG/ACT nasal spray Commonly known as: FLONASE Place 2 sprays into both nostrils daily.   HYDROcodone-acetaminophen 5-325 MG tablet Commonly known as: NORCO/VICODIN Take 1 tablet by mouth every 4 (four) hours as needed for moderate pain.   hydrocortisone 1 % lotion Apply 1 application topically 2 (two) times daily as needed for itching.   LORazepam 0.5 MG tablet Commonly known as: ATIVAN Take 1 tablet (0.5 mg total) by mouth every 6 (six) hours as needed for anxiety.   neomycin 500 MG tablet Commonly known as: MYCIFRADIN Take two tablets by mouth at 8:00pm, 9:00pm and 11:00pm the night before surgery.    omeprazole 20 MG tablet Commonly known as: PRILOSEC OTC Take 20 mg by mouth daily as needed (acid reflux).   ondansetron 8 MG tablet Commonly known as: Zofran Take 1 tablet (8 mg total) by mouth 2 (two) times daily as needed. Start on day 3 after chemotherapy.   prochlorperazine 10 MG tablet Commonly known as: COMPAZINE Take 1 tablet (10 mg total) by mouth every 6 (six) hours as needed (Nausea or vomiting).   traMADol 50 MG tablet Commonly known as: ULTRAM Take 1 tablet (50 mg total) by mouth every 6 (six) hours as needed.        Allergies:  Allergies  Allergen Reactions   Augmentin [Amoxicillin-Pot Clavulanate] Diarrhea    Extreme diarrhea, abdominal pain.   Irinotecan Other (See Comments)    Flushing, tingling, visual disturbance   Metronidazole Diarrhea   Vancomycin Rash    ?red syndrome   Tape Rash    Past Medical History, Surgical history, Social history, and Family History were reviewed and updated.  Review of Systems: Review of Systems  Constitutional: Negative.   HENT: Negative.    Eyes: Negative.   Respiratory: Negative.    Cardiovascular: Negative.   Gastrointestinal: Negative.   Genitourinary: Negative.   Musculoskeletal: Negative.   Skin: Negative.   Neurological: Negative.   Endo/Heme/Allergies: Negative.   Psychiatric/Behavioral: Negative.       Physical Exam: Her vital signs show temperature of 97.8.  Pulse 92.  Blood pressure is 111/67.  Weight is 184 pounds.  Wt Readings from Last 3 Encounters:  04/03/22 184 lb 6.4 oz (83.6 kg)  03/29/22 184 lb (83.5 kg)  03/15/22 182 lb (82.6 kg)    Physical Exam Vitals reviewed.  HENT:     Head: Normocephalic and atraumatic.  Eyes:     Pupils: Pupils are equal, round, and reactive to light.  Cardiovascular:     Rate and Rhythm: Normal rate and regular rhythm.     Heart sounds: Normal heart sounds.  Pulmonary:     Effort: Pulmonary effort is normal.     Breath sounds: Normal breath sounds.   Abdominal:     General: Bowel sounds are normal.     Palpations: Abdomen is soft.  Musculoskeletal:        General: No tenderness or deformity. Normal range of motion.     Cervical back: Normal range of motion.  Lymphadenopathy:     Cervical: No cervical adenopathy.  Skin:    General: Skin is warm and dry.     Findings: No erythema or rash.  Neurological:     Mental Status: She is alert and oriented to person, place, and time.  Psychiatric:        Behavior: Behavior normal.        Thought Content: Thought content normal.  Judgment: Judgment normal.     Lab Results  Component Value Date   WBC 2.0 (L) 04/03/2022   HGB 10.5 (L) 04/03/2022   HCT 32.7 (L) 04/03/2022   MCV 89.6 04/03/2022   PLT 134 (L) 04/03/2022   Lab Results  Component Value Date   FERRITIN 90 02/15/2022   IRON 37 02/15/2022   TIBC 342 02/15/2022   UIBC 305 02/15/2022   IRONPCTSAT 11 02/15/2022   Lab Results  Component Value Date   RETICCTPCT 1.4 02/15/2022   RBC 3.65 (L) 04/03/2022   No results found for: "KPAFRELGTCHN", "LAMBDASER", "KAPLAMBRATIO" No results found for: "IGGSERUM", "IGA", "IGMSERUM" No results found for: "TOTALPROTELP", "ALBUMINELP", "A1GS", "A2GS", "BETS", "BETA2SER", "GAMS", "MSPIKE", "SPEI"   Chemistry      Component Value Date/Time   NA 142 04/03/2022 0933   K 3.5 04/03/2022 0933   CL 106 04/03/2022 0933   CO2 27 04/03/2022 0933   BUN 7 04/03/2022 0933   CREATININE 0.49 04/03/2022 0933   CREATININE 0.56 03/23/2021 0000      Component Value Date/Time   CALCIUM 9.3 04/03/2022 0933   ALKPHOS 122 04/03/2022 0933   AST 20 04/03/2022 0933   ALT 20 04/03/2022 0933   BILITOT 0.3 04/03/2022 0933       Impression and Plan: Ms. Reither is a very charming  56 yo caucasian female with metastatic colon cancer.  Again, her disease has been confined to her liver.    We are still hoping to get her to surgery.  She had the Y90 procedure last week.  I would think that this will  help with what is in her liver.  Hopefully, systemic therapy will help the lymph node that is right outside of the liver.  We will see what her CEA level is.  Again, I am dropping the Avastin today.  We will restart this with her next cycle of treatment.  We will probably have to drop the oxaliplatin permanently.  It sounds like she is starting to have toxicity which I prefer her not to have.  We will go ahead with her treatment today.  I will give her a week off after this cycle.  This way, she will be treated after Thanksgiving.  I just hate to have her be treated the week of Thanksgiving which could cause problems.  I do not see a problem with her being treated 1 week late.  We will plan to get her back.  This will be her fourth cycle.  I probably would plan for a follow-up CT scan in about 6 weeks.  Since we are dropping the oxaliplatin and the Avastin, I do not think she needs to have Neulasta after this cycle of treatment.   [name], MD 11/7/202310:58 AM

## 2022-04-03 NOTE — Progress Notes (Signed)
Patient tolerated the Y90 procedure well without significant issues. She will continue on treatment today, however oxali will be dc'd due to side effects, and avastin will be held today.   Oncology Nurse Navigator Documentation     04/03/2022   10:00 AM  Oncology Nurse Navigator Flowsheets  Navigator Follow Up Date: 04/16/2022  Navigator Follow Up Reason: Follow-up Appointment;Chemotherapy  Navigator Location [facility]  Navigator Encounter Type Treatment;Appt/Treatment Plan Review  Patient Visit Type MedOnc  Treatment Phase Active Tx  Barriers/Navigation Needs Coordination of Care;Education  Interventions Psycho-Social Support  Acuity Level 2-Minimal Needs (1-2 Barriers Identified)  Support Groups/Services Friends and Family  Time Spent with Patient 15

## 2022-04-04 ENCOUNTER — Telehealth: Payer: Self-pay | Admitting: *Deleted

## 2022-04-04 NOTE — Telephone Encounter (Signed)
As noted below by Dr. [name], I informed the patient that she needs to get IV iron with pump d/c. She verbalized understanding.

## 2022-04-04 NOTE — Telephone Encounter (Signed)
-----   Message from [name], MD sent at 04/03/2022  5:38 PM EST ----- Call - the iron is borderline.  Please give her IV iron when she comes back on Thursday.  Rebecca Bullock

## 2022-04-05 ENCOUNTER — Other Ambulatory Visit: Payer: Self-pay

## 2022-04-05 ENCOUNTER — Inpatient Hospital Stay: Payer: Medicaid Other

## 2022-04-05 VITALS — BP 111/56 | HR 95 | Temp 98.0°F | Resp 18

## 2022-04-05 DIAGNOSIS — D509 Iron deficiency anemia, unspecified: Secondary | ICD-10-CM

## 2022-04-05 DIAGNOSIS — Z5112 Encounter for antineoplastic immunotherapy: Secondary | ICD-10-CM | POA: Diagnosis not present

## 2022-04-05 DIAGNOSIS — D649 Anemia, unspecified: Secondary | ICD-10-CM

## 2022-04-05 DIAGNOSIS — C189 Malignant neoplasm of colon, unspecified: Secondary | ICD-10-CM

## 2022-04-05 MED ORDER — PEGFILGRASTIM-CBQV 6 MG/0.6ML ~~LOC~~ SOSY
6.0000 mg | PREFILLED_SYRINGE | Freq: Once | SUBCUTANEOUS | Status: AC
  Administered 2022-04-05: 6 mg via SUBCUTANEOUS
  Filled 2022-04-05: qty 0.6

## 2022-04-05 MED ORDER — SODIUM CHLORIDE 0.9 % IV SOLN
125.0000 mg | Freq: Once | INTRAVENOUS | Status: AC
  Administered 2022-04-05: 125 mg via INTRAVENOUS
  Filled 2022-04-05: qty 10

## 2022-04-05 MED ORDER — SODIUM CHLORIDE 0.9% FLUSH
10.0000 mL | Freq: Once | INTRAVENOUS | Status: AC
  Administered 2022-04-05: 10 mL via INTRAVENOUS

## 2022-04-05 MED ORDER — HEPARIN SOD (PORK) LOCK FLUSH 100 UNIT/ML IV SOLN
500.0000 [IU] | Freq: Once | INTRAVENOUS | Status: AC
  Administered 2022-04-05: 500 [IU] via INTRAVENOUS

## 2022-04-05 MED ORDER — SODIUM CHLORIDE 0.9% FLUSH: 3.0000 mL | Freq: Once | INTRAVENOUS | Status: DC | PRN

## 2022-04-05 MED ORDER — SODIUM CHLORIDE 0.9 % IV SOLN: Freq: Once | INTRAVENOUS | Status: AC

## 2022-04-05 NOTE — Patient Instructions (Signed)
Sodium Ferric Gluconate Complex Injection What is this medication? SODIUM FERRIC GLUCONATE COMPLEX (SOE dee um FER ik GLOO koe nate KOM pleks) treats low levels of iron (iron deficiency anemia) in people with kidney disease. Iron is a mineral that plays an important role in making red blood cells, which carry oxygen from your lungs to the rest of your body. This medicine may be used for other purposes; ask your health care provider or pharmacist if you have questions. COMMON BRAND NAME(S): Ferrlecit, Nulecit What should I tell my care team before I take this medication? They need to know if you have any of the following conditions: Anemia that is not from iron deficiency High levels of iron in the blood An unusual or allergic reaction to iron, other medications, foods, dyes, or preservatives Pregnant or are trying to become pregnant Breast-feeding How should I use this medication? This medication is injected into a vein. It is given by your care team in a hospital or clinic setting. Talk to your care team about the use of this medication in children. While it may be prescribed for children as young as 6 years for selected conditions, precautions do apply. Overdosage: If you think you have taken too much of this medicine contact a poison control center or emergency room at once. NOTE: This medicine is only for you. Do not share this medicine with others. What if I miss a dose? It is important not to miss your dose. Call your care team if you are unable to keep an appointment. What may interact with this medication? Do not take this medication with any of the following: Deferasirox Deferoxamine Dimercaprol This medication may also interact with the following: Other iron products This list may not describe all possible interactions. Give your health care provider a list of all the medicines, herbs, non-prescription drugs, or dietary supplements you use. Also tell them if you smoke, drink  alcohol, or use illegal drugs. Some items may interact with your medicine. What should I watch for while using this medication? Your condition will be monitored carefully while you are receiving this medication. Visit your care team for regular checks on your progress. You may need blood work while you are taking this medication. What side effects may I notice from receiving this medication? Side effects that you should report to your care team as soon as possible: Allergic reactions--skin rash, itching, hives, swelling of the face, lips, tongue, or throat Low blood pressure--dizziness, feeling faint or lightheaded, blurry vision Shortness of breath Side effects that usually do not require medical attention (report to your care team if they continue or are bothersome): Flushing Headache Joint pain Muscle pain Nausea Pain, redness, or irritation at injection site This list may not describe all possible side effects. Call your doctor for medical advice about side effects. You may report side effects to FDA at 1-800-FDA-1088. Where should I keep my medication? This medication is given in a hospital or clinic and will not be stored at home. NOTE: This sheet is a summary. It may not cover all possible information. If you have questions about this medicine, talk to your doctor, pharmacist, or health care provider.  2023 Elsevier/Gold Standard (2007-07-05 00:00:00) Pegfilgrastim Injection What is this medication? PEGFILGRASTIM (PEG fil gra stim) lowers the risk of infection in people who are receiving chemotherapy. It works by Building control surveyor make more white blood cells, which protects your body from infection. It may also be used to help people who have been  exposed to high doses of radiation. This medicine may be used for other purposes; ask your health care provider or pharmacist if you have questions. COMMON BRAND NAME(S): [place], Neulasta, Nyvepria, Stimufend, UDENYCA,  Ziextenzo What should I tell my care team before I take this medication? They need to know if you have any of these conditions: Kidney disease Latex allergy Ongoing radiation therapy Sickle cell disease Skin reactions to acrylic adhesives (On-Body Injector only) An unusual or allergic reaction to pegfilgrastim, filgrastim, other medications, foods, dyes, or preservatives Pregnant or trying to get pregnant Breast-feeding How should I use this medication? This medication is for injection under the skin. If you get this medication at home, you will be taught how to prepare and give the pre-filled syringe or how to use the On-body Injector. Refer to the patient Instructions for Use for detailed instructions. Use exactly as directed. Tell your care team immediately if you suspect that the On-body Injector may not have performed as intended or if you suspect the use of the On-body Injector resulted in a missed or partial dose. It is important that you put your used needles and syringes in a special sharps container. Do not put them in a trash can. If you do not have a sharps container, call your pharmacist or care team to get one. Talk to your care team about the use of this medication in children. While this medication may be prescribed for selected conditions, precautions do apply. Overdosage: If you think you have taken too much of this medicine contact a poison control center or emergency room at once. NOTE: This medicine is only for you. Do not share this medicine with others. What if I miss a dose? It is important not to miss your dose. Call your care team if you miss your dose. If you miss a dose due to an On-body Injector failure or leakage, a new dose should be administered as soon as possible using a single prefilled syringe for manual use. What may interact with this medication? Interactions have not been studied. This list may not describe all possible interactions. Give your health care  provider a list of all the medicines, herbs, non-prescription drugs, or dietary supplements you use. Also tell them if you smoke, drink alcohol, or use illegal drugs. Some items may interact with your medicine. What should I watch for while using this medication? Your condition will be monitored carefully while you are receiving this medication. You may need blood work done while you are taking this medication. Talk to your care team about your risk of cancer. You may be more at risk for certain types of cancer if you take this medication. If you are going to need a MRI, CT scan, or other procedure, tell your care team that you are using this medication (On-Body Injector only). What side effects may I notice from receiving this medication? Side effects that you should report to your care team as soon as possible: Allergic reactions--skin rash, itching, hives, swelling of the face, lips, tongue, or throat Capillary leak syndrome--stomach or muscle pain, unusual weakness or fatigue, feeling faint or lightheaded, decrease in the amount of urine, swelling of the ankles, hands, or feet, trouble breathing High white blood cell level--fever, fatigue, trouble breathing, night sweats, change in vision, weight loss Inflammation of the aorta--fever, fatigue, back, chest, or stomach pain, severe headache Kidney injury (glomerulonephritis)--decrease in the amount of urine, red or dark brown urine, foamy or bubbly urine, swelling of the ankles,  hands, or feet Shortness of breath or trouble breathing Spleen injury--pain in upper left stomach or shoulder Unusual bruising or bleeding Side effects that usually do not require medical attention (report to your care team if they continue or are bothersome): Bone pain Pain in the hands or feet This list may not describe all possible side effects. Call your doctor for medical advice about side effects. You may report side effects to FDA at 1-800-FDA-1088. Where should  I keep my medication? Keep out of the reach of children. If you are using this medication at home, you will be instructed on how to store it. Throw away any unused medication after the expiration date on the label. NOTE: This sheet is a summary. It may not cover all possible information. If you have questions about this medicine, talk to your doctor, pharmacist, or health care provider.  2023 Elsevier/Gold Standard (2020-12-01 00:00:00)

## 2022-04-09 ENCOUNTER — Other Ambulatory Visit: Payer: Self-pay | Admitting: Interventional Radiology

## 2022-04-09 DIAGNOSIS — C189 Malignant neoplasm of colon, unspecified: Secondary | ICD-10-CM

## 2022-04-16 ENCOUNTER — Other Ambulatory Visit: Payer: Medicaid Other

## 2022-04-16 ENCOUNTER — Inpatient Hospital Stay: Payer: Medicaid Other

## 2022-04-16 ENCOUNTER — Ambulatory Visit: Payer: Medicaid Other | Admitting: Hematology & Oncology

## 2022-04-17 ENCOUNTER — Encounter: Payer: Self-pay | Admitting: Family

## 2022-04-17 ENCOUNTER — Encounter: Payer: Self-pay | Admitting: Hematology & Oncology

## 2022-04-23 ENCOUNTER — Inpatient Hospital Stay: Payer: Medicaid Other

## 2022-04-23 ENCOUNTER — Encounter: Payer: Self-pay | Admitting: Hematology & Oncology

## 2022-04-23 ENCOUNTER — Ambulatory Visit
Admission: RE | Admit: 2022-04-23 | Discharge: 2022-04-23 | Disposition: A | Discharge status: Home / Self Care | Payer: Medicaid Other | Source: Ambulatory Visit | Attending: Interventional Radiology | Admitting: Interventional Radiology

## 2022-04-23 ENCOUNTER — Other Ambulatory Visit: Payer: Self-pay

## 2022-04-23 ENCOUNTER — Telehealth: Payer: Self-pay | Admitting: *Deleted

## 2022-04-23 ENCOUNTER — Inpatient Hospital Stay (HOSPITAL_BASED_OUTPATIENT_CLINIC_OR_DEPARTMENT_OTHER): Payer: Medicaid Other | Admitting: Hematology & Oncology

## 2022-04-23 VITALS — BP 134/81 | HR 100 | Temp 97.8°F | Resp 18 | Ht 61.0 in | Wt 185.0 lb

## 2022-04-23 DIAGNOSIS — C189 Malignant neoplasm of colon, unspecified: Secondary | ICD-10-CM | POA: Diagnosis not present

## 2022-04-23 DIAGNOSIS — C787 Secondary malignant neoplasm of liver and intrahepatic bile duct: Secondary | ICD-10-CM

## 2022-04-23 DIAGNOSIS — Z5112 Encounter for antineoplastic immunotherapy: Secondary | ICD-10-CM | POA: Diagnosis not present

## 2022-04-23 HISTORY — PX: IR RADIOLOGIST EVAL & MGMT: IMG5224

## 2022-04-23 LAB — IRON AND IRON BINDING CAPACITY (CC-WL,HP ONLY)
Iron: 61 ug/dL (ref 28–170)
Saturation Ratios: 17 % (ref 10.4–31.8)
TIBC: 350 ug/dL (ref 250–450)
UIBC: 289 ug/dL (ref 148–442)

## 2022-04-23 LAB — CBC WITH DIFFERENTIAL (CANCER CENTER ONLY)
Abs Immature Granulocytes: 0.03 10*3/uL (ref 0.00–0.07)
Basophils Absolute: 0 10*3/uL (ref 0.0–0.1)
Basophils Relative: 0 %
Eosinophils Absolute: 0.1 10*3/uL (ref 0.0–0.5)
Eosinophils Relative: 2 %
HCT: 37 % (ref 36.0–46.0)
Hemoglobin: 11.6 g/dL — ABNORMAL LOW (ref 12.0–15.0)
Immature Granulocytes: 1 %
Lymphocytes Relative: 17 %
Lymphs Abs: 0.7 10*3/uL (ref 0.7–4.0)
MCH: 29.1 pg (ref 26.0–34.0)
MCHC: 31.4 g/dL (ref 30.0–36.0)
MCV: 93 fL (ref 80.0–100.0)
Monocytes Absolute: 0.5 10*3/uL (ref 0.1–1.0)
Monocytes Relative: 11 %
Neutro Abs: 2.9 10*3/uL (ref 1.7–7.7)
Neutrophils Relative %: 69 %
Platelet Count: 173 10*3/uL (ref 150–400)
RBC: 3.98 MIL/uL (ref 3.87–5.11)
RDW: 18.7 % — ABNORMAL HIGH (ref 11.5–15.5)
WBC Count: 4.2 10*3/uL (ref 4.0–10.5)
nRBC: 0 % (ref 0.0–0.2)

## 2022-04-23 LAB — CMP (CANCER CENTER ONLY)
ALT: 12 U/L (ref 0–44)
AST: 19 U/L (ref 15–41)
Albumin: 4.2 g/dL (ref 3.5–5.0)
Alkaline Phosphatase: 143 U/L — ABNORMAL HIGH (ref 38–126)
Anion gap: 9 (ref 5–15)
BUN: 6 mg/dL (ref 6–20)
CO2: 28 mmol/L (ref 22–32)
Calcium: 9.4 mg/dL (ref 8.9–10.3)
Chloride: 104 mmol/L (ref 98–111)
Creatinine: 0.54 mg/dL (ref 0.44–1.00)
GFR, Estimated: >60 mL/min (ref >60)
Glucose, Bld: 136 mg/dL — ABNORMAL HIGH (ref 70–99)
Potassium: 3.6 mmol/L (ref 3.5–5.1)
Sodium: 141 mmol/L (ref 135–145)
Total Bilirubin: 0.3 mg/dL (ref 0.3–1.2)
Total Protein: 6.8 g/dL (ref 6.5–8.1)

## 2022-04-23 LAB — LACTATE DEHYDROGENASE: LDH: 292 U/L — ABNORMAL HIGH (ref 98–192)

## 2022-04-23 LAB — FERRITIN: Ferritin: 188 ng/mL (ref 11–307)

## 2022-04-23 LAB — CEA (IN HOUSE-CHCC): CEA (CHCC-In House): 9.61 ng/mL — ABNORMAL HIGH (ref 0.00–5.00)

## 2022-04-23 MED ORDER — SODIUM CHLORIDE 0.9 % IV SOLN
148.5000 mg/m2 | Freq: Once | INTRAVENOUS | Status: AC
  Administered 2022-04-23: 280 mg via INTRAVENOUS
  Filled 2022-04-23: qty 10

## 2022-04-23 MED ORDER — SODIUM CHLORIDE 0.9 % IV SOLN: Freq: Once | INTRAVENOUS | Status: AC

## 2022-04-23 MED ORDER — SODIUM CHLORIDE 0.9 % IV SOLN
150.0000 mg | Freq: Once | INTRAVENOUS | Status: AC
  Administered 2022-04-23: 150 mg via INTRAVENOUS
  Filled 2022-04-23: qty 150

## 2022-04-23 MED ORDER — LEUCOVORIN CALCIUM INJECTION 350 MG
400.0000 mg/m2 | Freq: Once | INTRAVENOUS | Status: AC
  Administered 2022-04-23: 756 mg via INTRAVENOUS
  Filled 2022-04-23: qty 37.8

## 2022-04-23 MED ORDER — PALONOSETRON HCL INJECTION 0.25 MG/5ML
0.2500 mg | Freq: Once | INTRAVENOUS | Status: AC
  Administered 2022-04-23: 0.25 mg via INTRAVENOUS
  Filled 2022-04-23: qty 5

## 2022-04-23 MED ORDER — SODIUM CHLORIDE 0.9 % IV SOLN
2400.0000 mg/m2 | INTRAVENOUS | Status: DC
  Administered 2022-04-23: 4550 mg via INTRAVENOUS
  Filled 2022-04-23: qty 91

## 2022-04-23 MED ORDER — SODIUM CHLORIDE 0.9 % IV SOLN
10.0000 mg | Freq: Once | INTRAVENOUS | Status: AC
  Administered 2022-04-23: 10 mg via INTRAVENOUS
  Filled 2022-04-23: qty 10

## 2022-04-23 MED ORDER — SODIUM CHLORIDE 0.9 % IV SOLN
5.0000 mg/kg | Freq: Once | INTRAVENOUS | Status: AC
  Administered 2022-04-23: 400 mg via INTRAVENOUS
  Filled 2022-04-23: qty 16

## 2022-04-23 MED ORDER — ALTEPLASE 2 MG IJ SOLR
2.0000 mg | Freq: Once | INTRAMUSCULAR | Status: AC | PRN
  Administered 2022-04-23: 2 mg
  Filled 2022-04-23: qty 2

## 2022-04-23 NOTE — Progress Notes (Signed)
Referring Physician(s): Burney Gauze, MD  Reason for follow up:  3 weeks s/p TARE, virtual telephone visit  History of present illness: 56 year old female with history of BRAF+ metastatic ascending colonic mass with metastasis to the liver.  She is status post FOLFOXIRI/Avastin, most recently just [facility] (s/p 10 cycles total since January 2023).   She is now status post right superior lobar transhepatic radioembolization on 03/29/22.  The procedure went as planned without complication.  She has recovered very well from radioembolization.  She denies fevers, chills, shortness of breath, abdominal pain, nausea, vomiting, jaundice.  She followed up with Dr. [name] earlier today.  Past Medical History:  Diagnosis Date   Colon cancer metastasized to liver ([place]) 05/05/2021   Family history of lung cancer    Family history of pancreatic cancer    Goals of care, counseling/discussion 05/05/2021    Past Surgical History:  Procedure Laterality Date   IR 3D INDEPENDENT WKST  03/15/2022   IR ANGIOGRAM SELECTIVE EACH ADDITIONAL VESSEL  03/15/2022   IR ANGIOGRAM SELECTIVE EACH ADDITIONAL VESSEL  03/15/2022   IR ANGIOGRAM SELECTIVE EACH ADDITIONAL VESSEL  03/15/2022   IR ANGIOGRAM SELECTIVE EACH ADDITIONAL VESSEL  03/29/2022   IR ANGIOGRAM SELECTIVE EACH ADDITIONAL VESSEL  03/29/2022   IR ANGIOGRAM VISCERAL SELECTIVE  03/15/2022   IR ANGIOGRAM VISCERAL SELECTIVE  03/29/2022   IR EMBO ARTERIAL NOT HEMORR HEMANG INC GUIDE ROADMAPPING  03/15/2022   IR EMBO TUMOR ORGAN ISCHEMIA INFARCT INC GUIDE ROADMAPPING  03/29/2022   IR IMAGING GUIDED PORT INSERTION  05/11/2021   IR RADIOLOGIST EVAL & MGMT  02/15/2022   IR US GUIDE VASC ACCESS RIGHT  03/29/2022   uterine ablation      Allergies: Augmentin [amoxicillin-pot clavulanate], Irinotecan, Metronidazole, Vancomycin, and Tape  Medications: Prior to Admission medications   Medication Sig Start Date End Date Taking? Authorizing Provider  Acetaminophen  325 MG CAPS Take 650 mg by mouth every 8 (eight) hours as needed for moderate pain or headache. Patient not taking: Reported on 04/03/2022    [provider]  betamethasone valerate (VALISONE) 0.1 % cream Apply 1 application  topically See admin instructions. Apply 1 application topically every other day as needed for psoriasis flare 11/11/20   [provider]  cyclobenzaprine (FLEXERIL) 10 MG tablet Take 1 tablet (10 mg total) by mouth 3 (three) times daily as needed for muscle spasms. 02/19/22   [name], MD  diphenoxylate-atropine (LOMOTIL) 2.5-0.025 MG tablet Take 2 tablets by mouth 4 (four) times daily as needed for diarrhea or loose stools. Patient not taking: Reported on 04/03/2022 11/22/21   [name], MD  estradiol (ESTRACE) 0.1 MG/GM vaginal cream Place 1 Applicatorful vaginally daily as needed (dryness / irritation). 11/11/20   [provider]  hydrocortisone 1 % lotion Apply 1 application topically 2 (two) times daily as needed for itching. 06/26/21   [name], MD  LORazepam (ATIVAN) 0.5 MG tablet Take 1 tablet (0.5 mg total) by mouth every 6 (six) hours as needed for anxiety. 11/13/21   [name], MD  omeprazole (PRILOSEC OTC) 20 MG tablet Take 20 mg by mouth daily as needed (acid reflux).    [provider]  traMADol (ULTRAM) 50 MG tablet Take 1 tablet (50 mg total) by mouth every 6 (six) hours as needed. 02/19/22   [name], MD     Family History  Problem Relation Age of Onset   Lung cancer Mother  Diabetes Maternal Grandmother    Pancreatic cancer Maternal Grandfather    Multiple sclerosis Maternal Aunt    Alcohol abuse Maternal Uncle     Social History   Socioeconomic History   Marital status: Single    Spouse name: Not on file   Number of children: Not on file   Years of education: Not on file   Highest education level: Not on file  Occupational History   Not on file  Tobacco Use   Smoking status:  Never   Smokeless tobacco: Never  Vaping Use   Vaping Use: Never used  Substance and Sexual Activity   Alcohol use: Yes    Comment: occasionally   Drug use: Not Currently   Sexual activity: Not on file  Other Topics Concern   Not on file  Social History Narrative   Not on file   Social Determinants of Health   Financial Resource Strain: Medium Risk (05/18/2021)   Overall Financial Resource Strain (CARDIA)    Difficulty of Paying Living Expenses: Somewhat hard  Food Insecurity: No Food Insecurity (05/18/2021)   Hunger Vital Sign    Worried About Running Out of Food in the Last Year: Never true    Ran Out of Food in the Last Year: Never true  Transportation Needs: No Transportation Needs (05/18/2021)   PRAPARE - Hydrologist (Medical): No    Lack of Transportation (Non-Medical): No  Physical Activity: Not on file  Stress: Not on file  Social Connections: Not on file     Vital Signs: There were no vitals taken for this visit.  No physical examination was performed in lieu of virtual telephone clinic visit.   Imaging: No results found.  Labs:  CBC: Recent Labs    03/19/22 0945 03/29/22 0820 04/03/22 0933 04/23/22 0942  WBC 6.4 2.4* 2.0* 4.2  HGB 11.3* 10.3* 10.5* 11.6*  HCT 35.2* 31.9* 32.7* 37.0  PLT 110* 128* 134* 173    COAGS: Recent Labs    06/19/21 2331 03/15/22 0801 03/29/22 0820  INR 1.0 1.0 1.0  APTT 26  --   --     BMP: Recent Labs    03/19/22 0945 03/29/22 0820 04/03/22 0933 04/23/22 0942  NA 142 141 142 141  K 3.2* 3.3* 3.5 3.6  CL 105 109 106 104  CO2 _0 GLUCOSE 114* 104* 116* 136*  BUN 5* _1 CALCIUM 9.1 8.5* 9.3 9.4  CREATININE 0.55 0.42* 0.49 0.54  GFRNONAA >60 >60 >60 >60    LIVER FUNCTION TESTS: Recent Labs    03/19/22 0945 03/29/22 0820 04/03/22 0933 04/23/22 0942  BILITOT 0.2* 0.4 0.3 0.3  AST _2 ALT _3 ALKPHOS 135* 101 122 143*  PROT 6.8 6.6 7.3  6.8  ALBUMIN 4.2 3.4* 4.1 4.2    Assessment and Plan: 56 year old female with metastatic colon cancer to the liver status post right superior lobar transhepatic radioembolization on 03/29/22.  She has tolerated this very well and remains asymptomatic.  -plan for MRI abdomen in early February -follow up in [place] clinic after MRI      Electronically Signed: Rosanne Ashing Orville Mena 04/23/2022, 3:16 PM   I spent a total of 25 Minutes in virtual telephone clinical consultation, greater than 50% of which was counseling/coordinating care for metastatic colon cancer to the liver.

## 2022-04-23 NOTE — Telephone Encounter (Signed)
As noted below by Dr. [name], I informed the patient that the CEA level is back down to 9.6. She verbalized understanding.

## 2022-04-23 NOTE — Patient Instructions (Addendum)
[facility]  Discharge Instructions: Thank you for choosing [facility] to provide your oncology and hematology care.   If you have a lab appointment with the [facility], please go directly to the [facility] and check in at the registration area.  Wear comfortable clothing and clothing appropriate for easy access to any Portacath or PICC line.   We strive to give you quality time with your provider. You may need to reschedule your appointment if you arrive late (15 or more minutes).  Arriving late affects you and other patients whose appointments are after yours.  Also, if you miss three or more appointments without notifying the office, you may be dismissed from the clinic at the provider's discretion.      For prescription refill requests, have your pharmacy contact our office and allow 72 hours for refills to be completed.    Today you received the following chemotherapy and/or immunotherapy agents Adrucil, Mvasi, Camptosar, Leucovorin      To help prevent nausea and vomiting after your treatment, we encourage you to take your nausea medication as directed.  BELOW ARE SYMPTOMS THAT SHOULD BE REPORTED IMMEDIATELY: *FEVER GREATER THAN 100.4 F (38 C) OR HIGHER *CHILLS OR SWEATING *NAUSEA AND VOMITING THAT IS NOT CONTROLLED WITH YOUR NAUSEA MEDICATION *UNUSUAL SHORTNESS OF BREATH *UNUSUAL BRUISING OR BLEEDING *URINARY PROBLEMS (pain or burning when urinating, or frequent urination) *BOWEL PROBLEMS (unusual diarrhea, constipation, pain near the anus) TENDERNESS IN MOUTH AND THROAT WITH OR WITHOUT PRESENCE OF ULCERS (sore throat, sores in mouth, or a toothache) UNUSUAL RASH, SWELLING OR PAIN  UNUSUAL VAGINAL DISCHARGE OR ITCHING   Items with * indicate a potential emergency and should be followed up as soon as possible or go to the Emergency Department if any problems should occur.  Please show the CHEMOTHERAPY ALERT CARD or IMMUNOTHERAPY  ALERT CARD at check-in to the Emergency Department and triage nurse. Should you have questions after your visit or need to cancel or reschedule your appointment, please contact [facility]  [phone] and follow the prompts.  Office hours are 8:00 a.m. to 4:30 p.m. Monday - Friday. Please note that voicemails left after 4:00 p.m. may not be returned until the following business day.  We are closed weekends and major holidays. You have access to a nurse at all times for urgent questions. Please call the main number to the clinic [phone] and follow the prompts.  For any non-urgent questions, you may also contact your provider using MyChart. We now offer e-Visits for anyone 11 and older to request care online for non-urgent symptoms. For details visit [email or web address].   Also download the MyChart app! Go to the app store, search "MyChart", open the app, select [facility], and log in with your MyChart username and password.  Masks are optional in the cancer centers. If you would like for your care team to wear a mask while they are taking care of you, please let them know. You may have one support person who is at least 54 years old accompany you for your appointments.

## 2022-04-23 NOTE — Telephone Encounter (Signed)
-----   Message from [name], MD sent at 04/23/2022  2:15 PM EST ----- Call and let her know that the CEA level is back down to 9.6.  [name]

## 2022-04-23 NOTE — Progress Notes (Signed)
Hematology and Oncology Follow Up Visit  Rebecca Bullock [id number] 11-30-1965 56 y.o. 04/23/2022   Principle Diagnosis:  Metastatic colorectal cancer-liver metastasis- BRAF (+)   Current Therapy:        Status post cycle 1 of chemotherapy with FOLFOXIRI/Avastin Encorafenib/Vectibix -- started on 06/19/2021, s/p cycle #6 -- d/c on 10/31/2021 FOLFOXIRI/Avastin-- s/p cycle  #4-- start on 02/19/2022, -oxaliplatin       dropped on 04/03/2022 IV iron-Ferrlecit given on 01/12/2022               Interim History:  Rebecca Bullock is here today for follow-up.  She did have a nice Thanksgiving.  She was with her family.  She is with her new grandson who was born back in early October.  She did well with the chemotherapy.  We drop the oxaliplatin because of the neuropathy issues.  She did have the Y-90 procedure back on 03/29/2022.  I think might be a little early for [place] to do scan to see how everything looks.  Her last CEA was up a little bit at 11.9.  She has had no cough or shortness of breath.  She had little bit of back discomfort.  She has had problems with constipation.  She was constipated for 3 days.  She then had some diarrhea.  She then was constipated again.  I told her to take MiraLAX twice a day to try to help with the constipation.  She has had no fever.  There is been no bleeding.  She has had no urinary issues.  Overall, I would say performance status is probably ECOG 1.    Medications:  Allergies as of 04/23/2022       Reactions   Augmentin [amoxicillin-pot Clavulanate] Diarrhea   Extreme diarrhea, abdominal pain.   Irinotecan Other (See Comments)   Flushing, tingling, visual disturbance   Metronidazole Diarrhea   Vancomycin Rash   ?red syndrome   Tape Rash        Medication List        Accurate as of April 23, 2022 10:08 AM. If you have any questions, ask your nurse or doctor.          STOP taking these medications    dexamethasone 4 MG tablet Commonly  known as: DECADRON Stopped by: [name], MD   encorafenib 75 MG capsule Commonly known as: BRAFTOVI Stopped by: [name], MD   erythromycin base 500 MG tablet Commonly known as: E-MYCIN Stopped by: [name], MD   fluticasone 50 MCG/ACT nasal spray Commonly known as: FLONASE Stopped by: [name], MD   HYDROcodone-acetaminophen 5-325 MG tablet Commonly known as: NORCO/VICODIN Stopped by: [name], MD   neomycin 500 MG tablet Commonly known as: MYCIFRADIN Stopped by: [name], MD   ondansetron 8 MG tablet Commonly known as: Zofran Stopped by: [name], MD   prochlorperazine 10 MG tablet Commonly known as: COMPAZINE Stopped by: [name], MD       TAKE these medications    Acetaminophen 325 MG Caps Take 650 mg by mouth every 8 (eight) hours as needed for moderate pain or headache.   betamethasone valerate 0.1 % cream Commonly known as: VALISONE Apply 1 application  topically See admin instructions. Apply 1 application topically every other day as needed for psoriasis flare   cyclobenzaprine 10 MG tablet Commonly known as: FLEXERIL Take 1 tablet (10 mg total) by mouth 3 (three) times daily as needed for  muscle spasms.   diphenoxylate-atropine 2.5-0.025 MG tablet Commonly known as: LOMOTIL Take 2 tablets by mouth 4 (four) times daily as needed for diarrhea or loose stools.   estradiol 0.1 MG/GM vaginal cream Commonly known as: ESTRACE Place 1 Applicatorful vaginally daily as needed (dryness / irritation).   hydrocortisone 1 % lotion Apply 1 application topically 2 (two) times daily as needed for itching.   LORazepam 0.5 MG tablet Commonly known as: ATIVAN Take 1 tablet (0.5 mg total) by mouth every 6 (six) hours as needed for anxiety.   omeprazole 20 MG tablet Commonly known as: PRILOSEC OTC Take 20 mg by mouth daily as needed (acid reflux).   traMADol 50 MG tablet Commonly known as: ULTRAM Take 1  tablet (50 mg total) by mouth every 6 (six) hours as needed.        Allergies:  Allergies  Allergen Reactions   Augmentin [Amoxicillin-Pot Clavulanate] Diarrhea    Extreme diarrhea, abdominal pain.   Irinotecan Other (See Comments)    Flushing, tingling, visual disturbance   Metronidazole Diarrhea   Vancomycin Rash    ?red syndrome   Tape Rash    Past Medical History, Surgical history, Social history, and Family History were reviewed and updated.  Review of Systems: Review of Systems  Constitutional: Negative.   HENT: Negative.    Eyes: Negative.   Respiratory: Negative.    Cardiovascular: Negative.   Gastrointestinal: Negative.   Genitourinary: Negative.   Musculoskeletal: Negative.   Skin: Negative.   Neurological: Negative.   Endo/Heme/Allergies: Negative.   Psychiatric/Behavioral: Negative.       Physical Exam: Her vital signs show temperature of 97.8.  Pulse 92.  Blood pressure is 111/67.  Weight is 184 pounds.  Wt Readings from Last 3 Encounters:  04/23/22 185 lb (83.9 kg)  04/03/22 184 lb 6.4 oz (83.6 kg)  03/29/22 184 lb (83.5 kg)    Physical Exam Vitals reviewed.  HENT:     Head: Normocephalic and atraumatic.  Eyes:     Pupils: Pupils are equal, round, and reactive to light.  Cardiovascular:     Rate and Rhythm: Normal rate and regular rhythm.     Heart sounds: Normal heart sounds.  Pulmonary:     Effort: Pulmonary effort is normal.     Breath sounds: Normal breath sounds.  Abdominal:     General: Bowel sounds are normal.     Palpations: Abdomen is soft.  Musculoskeletal:        General: No tenderness or deformity. Normal range of motion.     Cervical back: Normal range of motion.  Lymphadenopathy:     Cervical: No cervical adenopathy.  Skin:    General: Skin is warm and dry.     Findings: No erythema or rash.  Neurological:     Mental Status: She is alert and oriented to person, place, and time.  Psychiatric:        Behavior: Behavior  normal.        Thought Content: Thought content normal.        Judgment: Judgment normal.      Lab Results  Component Value Date   WBC 4.2 04/23/2022   HGB 11.6 (L) 04/23/2022   HCT 37.0 04/23/2022   MCV 93.0 04/23/2022   PLT 173 04/23/2022   Lab Results  Component Value Date   FERRITIN 131 04/03/2022   IRON 39 04/03/2022   TIBC 339 04/03/2022   UIBC 300 04/03/2022   IRONPCTSAT 12 04/03/2022  Lab Results  Component Value Date   RETICCTPCT 1.4 02/15/2022   RBC 3.98 04/23/2022   No results found for: "KPAFRELGTCHN", "LAMBDASER", "KAPLAMBRATIO" No results found for: "IGGSERUM", "IGA", "IGMSERUM" No results found for: "TOTALPROTELP", "ALBUMINELP", "A1GS", "A2GS", "BETS", "BETA2SER", "GAMS", "MSPIKE", "SPEI"   Chemistry      Component Value Date/Time   NA 142 04/03/2022 0933   K 3.5 04/03/2022 0933   CL 106 04/03/2022 0933   CO2 27 04/03/2022 0933   BUN 7 04/03/2022 0933   CREATININE 0.49 04/03/2022 0933   CREATININE 0.56 03/23/2021 0000      Component Value Date/Time   CALCIUM 9.3 04/03/2022 0933   ALKPHOS 122 04/03/2022 0933   AST 20 04/03/2022 0933   ALT 20 04/03/2022 0933   BILITOT 0.3 04/03/2022 0933       Impression and Plan: Rebecca Bullock is a very charming  56 yo caucasian female with metastatic colon cancer.  Again, her disease has been confined to her liver.    We are still hoping to get her to surgery.  She had the Y90 procedure last week.  I would think that this will help with what is in her liver.  Hopefully, systemic therapy will help the lymph node that is right outside of the liver.  We will see what her CEA level is.  We will restart the Avastin.  Hopefully, this will give [place] a little bit of benefit.  We will plan to get her back in 2 weeks for her next cycle of treatment.  I probably would do a scan on her I would think probably in early January.   [name], MD 11/27/202310:08 AM

## 2022-04-23 NOTE — Patient Instructions (Signed)

## 2022-04-24 ENCOUNTER — Encounter: Payer: Self-pay | Admitting: *Deleted

## 2022-04-24 NOTE — Progress Notes (Unsigned)
Patient doing well after her most recent treatment and Y-90. She will need scans next month after her next treatment.   Oncology Nurse Navigator Documentation     04/24/2022    8:30 AM  Oncology Nurse Navigator Flowsheets  Navigator Follow Up Date: 05/08/2022  Navigator Follow Up Reason: Follow-up Appointment;Chemotherapy  Navigator Location [facility]  Navigator Encounter Type Appt/Treatment Plan Review  Patient Visit Type MedOnc  Treatment Phase Active Tx  Barriers/Navigation Needs Coordination of Care;Education  Interventions None Required  Acuity Level 2-Minimal Needs (1-2 Barriers Identified)  Support Groups/Services Friends and Family  Time Spent with Patient 15

## 2022-04-25 ENCOUNTER — Encounter: Payer: Self-pay | Admitting: Hematology & Oncology

## 2022-04-25 ENCOUNTER — Other Ambulatory Visit: Payer: Self-pay

## 2022-04-25 ENCOUNTER — Encounter: Payer: Self-pay | Admitting: Family

## 2022-04-25 ENCOUNTER — Inpatient Hospital Stay: Payer: Medicaid Other

## 2022-04-25 VITALS — BP 121/65 | HR 67 | Temp 97.7°F | Resp 18

## 2022-04-25 DIAGNOSIS — C189 Malignant neoplasm of colon, unspecified: Secondary | ICD-10-CM

## 2022-04-25 DIAGNOSIS — Z5112 Encounter for antineoplastic immunotherapy: Secondary | ICD-10-CM | POA: Diagnosis not present

## 2022-04-25 MED ORDER — SODIUM CHLORIDE 0.9% FLUSH
10.0000 mL | INTRAVENOUS | Status: DC | PRN
  Administered 2022-04-25: 10 mL

## 2022-04-25 MED ORDER — HEPARIN SOD (PORK) LOCK FLUSH 100 UNIT/ML IV SOLN
500.0000 [IU] | Freq: Once | INTRAVENOUS | Status: AC | PRN
  Administered 2022-04-25: 500 [IU]

## 2022-04-25 NOTE — Progress Notes (Signed)
Order received per Dr. [name] to not give Ellen Henri today.

## 2022-04-25 NOTE — Patient Instructions (Addendum)
[facility]  Discharge Instructions: Thank you for choosing [facility] to provide your oncology and hematology care.   If you have a lab appointment with the [facility], please go directly to the [facility] and check in at the registration area.  Wear comfortable clothing and clothing appropriate for easy access to any Portacath or PICC line.   We strive to give you quality time with your provider. You may need to reschedule your appointment if you arrive late (15 or more minutes).  Arriving late affects you and other patients whose appointments are after yours.  Also, if you miss three or more appointments without notifying the office, you may be dismissed from the clinic at the provider's discretion.      For prescription refill requests, have your pharmacy contact our office and allow 72 hours for refills to be completed.    Today you received the following chemotherapy and/or immunotherapy agents      To help prevent nausea and vomiting after your treatment, we encourage you to take your nausea medication as directed.  BELOW ARE SYMPTOMS THAT SHOULD BE REPORTED IMMEDIATELY: *FEVER GREATER THAN 100.4 F (38 C) OR HIGHER *CHILLS OR SWEATING *NAUSEA AND VOMITING THAT IS NOT CONTROLLED WITH YOUR NAUSEA MEDICATION *UNUSUAL SHORTNESS OF BREATH *UNUSUAL BRUISING OR BLEEDING *URINARY PROBLEMS (pain or burning when urinating, or frequent urination) *BOWEL PROBLEMS (unusual diarrhea, constipation, pain near the anus) TENDERNESS IN MOUTH AND THROAT WITH OR WITHOUT PRESENCE OF ULCERS (sore throat, sores in mouth, or a toothache) UNUSUAL RASH, SWELLING OR PAIN  UNUSUAL VAGINAL DISCHARGE OR ITCHING   Items with * indicate a potential emergency and should be followed up as soon as possible or go to the Emergency Department if any problems should occur.  Please show the CHEMOTHERAPY ALERT CARD or IMMUNOTHERAPY ALERT CARD at check-in to the Emergency  Department and triage nurse. Should you have questions after your visit or need to cancel or reschedule your appointment, please contact [facility]  [phone] and follow the prompts.  Office hours are 8:00 a.m. to 4:30 p.m. Monday - Friday. Please note that voicemails left after 4:00 p.m. may not be returned until the following business day.  We are closed weekends and major holidays. You have access to a nurse at all times for urgent questions. Please call the main number to the clinic [phone] and follow the prompts.  For any non-urgent questions, you may also contact your provider using MyChart. We now offer e-Visits for anyone 54 and older to request care online for non-urgent symptoms. For details visit [email or web address].   Also download the MyChart app! Go to the app store, search "MyChart", open the app, select [facility], and log in with your MyChart username and password.  Masks are optional in the cancer centers. If you would like for your care team to wear a mask while they are taking care of you, please let them know. You may have one support person who is at least 54 years old accompany you for your appointments.Implanted Port Insertion, Care After The following information offers guidance on how to care for yourself after your procedure. Your health care provider may also give you more specific instructions. If you have problems or questions, contact your health care provider. What can I expect after the procedure? After the procedure, it is common to have: Discomfort at the port insertion site. Bruising on the skin over the  port. This should improve over 3-4 days. Follow these instructions at home: [facility] care After your port is placed, you will get a manufacturer's information card. The card has information about your port. Keep this card with you at all times. Take care of the port as told by your health care provider. Ask your health care  provider if you or a family member can get training for taking care of the port at home. A home health care nurse will be be available to help care for the port. Make sure to remember what type of port you have. Incision care     Follow instructions from your health care provider about how to take care of your port insertion site. Make sure you: Wash your hands with soap and water for at least 20 seconds before and after you change your bandage (dressing). If soap and water are not available, use hand sanitizer. Change your dressing as told by your health care provider. Leave stitches (sutures), skin glue, or adhesive strips in place. These skin closures may need to stay in place for 2 weeks or longer. If adhesive strip edges start to loosen and curl up, you may trim the loose edges. Do not remove adhesive strips completely unless your health care provider tells you to do that. Check your port insertion site every day for signs of infection. Check for: Redness, swelling, or pain. Fluid or blood. Warmth. Pus or a bad smell. Activity Return to your normal activities as told by your health care provider. Ask your health care provider what activities are safe for you. You may have to avoid lifting. Ask your health care provider how much you can safely lift. General instructions Take over-the-counter and prescription medicines only as told by your health care provider. Do not take baths, swim, or use a hot tub until your health care provider approves. Ask your health care provider if you may take showers. You may only be allowed to take sponge baths. If you were given a sedative during the procedure, it can affect you for several hours. Do not drive or operate machinery until your health care provider says that it is safe. Wear a medical alert bracelet in case of an emergency. This will tell any health care providers that you have a port. Keep all follow-up visits. This is important. Contact a  health care provider if: You cannot flush your port with saline as directed, or you cannot draw blood from the port. You have a fever or chills. You have redness, swelling, or pain around your port insertion site. You have fluid or blood coming from your port insertion site. Your port insertion site feels warm to the touch. You have pus or a bad smell coming from the port insertion site. Get help right away if: You have chest pain or shortness of breath. You have bleeding from your port that you cannot control. These symptoms may be an emergency. Get help right away. Call 911. Do not wait to see if the symptoms will go away. Do not drive yourself to the hospital. Summary Take care of the port as told by your health care provider. Keep the manufacturer's information card with you at all times. Change your dressing as told by your health care provider. Contact a health care provider if you have a fever or chills or if you have redness, swelling, or pain around your port insertion site. Keep all follow-up visits. This information is not intended to replace advice given to  you by your health care provider. Make sure you discuss any questions you have with your health care provider. Document Revised: 11/15/2020 Document Reviewed: 11/15/2020 Elsevier Patient Education  [place].

## 2022-05-08 ENCOUNTER — Inpatient Hospital Stay (HOSPITAL_BASED_OUTPATIENT_CLINIC_OR_DEPARTMENT_OTHER): Payer: Medicaid Other | Admitting: Oncology

## 2022-05-08 ENCOUNTER — Inpatient Hospital Stay: Payer: Medicaid Other

## 2022-05-08 ENCOUNTER — Encounter: Payer: Self-pay | Admitting: Oncology

## 2022-05-08 ENCOUNTER — Inpatient Hospital Stay: Payer: Medicaid Other | Attending: Hematology & Oncology

## 2022-05-08 VITALS — BP 111/75 | HR 85 | Temp 98.8°F | Resp 17 | Ht 61.0 in

## 2022-05-08 VITALS — BP 131/66 | HR 81 | Resp 16

## 2022-05-08 DIAGNOSIS — C189 Malignant neoplasm of colon, unspecified: Secondary | ICD-10-CM

## 2022-05-08 DIAGNOSIS — C182 Malignant neoplasm of ascending colon: Secondary | ICD-10-CM | POA: Insufficient documentation

## 2022-05-08 DIAGNOSIS — Z452 Encounter for adjustment and management of vascular access device: Secondary | ICD-10-CM | POA: Insufficient documentation

## 2022-05-08 DIAGNOSIS — Z5112 Encounter for antineoplastic immunotherapy: Secondary | ICD-10-CM | POA: Insufficient documentation

## 2022-05-08 DIAGNOSIS — C787 Secondary malignant neoplasm of liver and intrahepatic bile duct: Secondary | ICD-10-CM | POA: Diagnosis not present

## 2022-05-08 DIAGNOSIS — G620 Drug-induced polyneuropathy: Secondary | ICD-10-CM | POA: Insufficient documentation

## 2022-05-08 DIAGNOSIS — D509 Iron deficiency anemia, unspecified: Secondary | ICD-10-CM | POA: Diagnosis not present

## 2022-05-08 DIAGNOSIS — Z5111 Encounter for antineoplastic chemotherapy: Secondary | ICD-10-CM | POA: Insufficient documentation

## 2022-05-08 DIAGNOSIS — Z5189 Encounter for other specified aftercare: Secondary | ICD-10-CM | POA: Diagnosis not present

## 2022-05-08 LAB — CBC WITH DIFFERENTIAL (CANCER CENTER ONLY)
Abs Immature Granulocytes: 0 10*3/uL (ref 0.00–0.07)
Basophils Absolute: 0 10*3/uL (ref 0.0–0.1)
Basophils Relative: 0 %
Eosinophils Absolute: 0.1 10*3/uL (ref 0.0–0.5)
Eosinophils Relative: 3 %
HCT: 36 % (ref 36.0–46.0)
Hemoglobin: 11.5 g/dL — ABNORMAL LOW (ref 12.0–15.0)
Immature Granulocytes: 0 %
Lymphocytes Relative: 19 %
Lymphs Abs: 0.7 10*3/uL (ref 0.7–4.0)
MCH: 29.6 pg (ref 26.0–34.0)
MCHC: 31.9 g/dL (ref 30.0–36.0)
MCV: 92.8 fL (ref 80.0–100.0)
Monocytes Absolute: 0.4 10*3/uL (ref 0.1–1.0)
Monocytes Relative: 11 %
Neutro Abs: 2.4 10*3/uL (ref 1.7–7.7)
Neutrophils Relative %: 67 %
Platelet Count: 147 10*3/uL — ABNORMAL LOW (ref 150–400)
RBC: 3.88 MIL/uL (ref 3.87–5.11)
RDW: 18.2 % — ABNORMAL HIGH (ref 11.5–15.5)
WBC Count: 3.6 10*3/uL — ABNORMAL LOW (ref 4.0–10.5)
nRBC: 0 % (ref 0.0–0.2)

## 2022-05-08 LAB — IRON AND IRON BINDING CAPACITY (CC-WL,HP ONLY)
Iron: 45 ug/dL (ref 28–170)
Saturation Ratios: 13 % (ref 10.4–31.8)
TIBC: 349 ug/dL (ref 250–450)
UIBC: 304 ug/dL (ref 148–442)

## 2022-05-08 LAB — TOTAL PROTEIN, URINE DIPSTICK: Protein, ur: NEGATIVE mg/dL

## 2022-05-08 LAB — CEA (IN HOUSE-CHCC): CEA (CHCC-In House): 8.5 ng/mL — ABNORMAL HIGH (ref 0.00–5.00)

## 2022-05-08 LAB — CMP (CANCER CENTER ONLY)
ALT: 16 U/L (ref 0–44)
AST: 23 U/L (ref 15–41)
Albumin: 4 g/dL (ref 3.5–5.0)
Alkaline Phosphatase: 135 U/L — ABNORMAL HIGH (ref 38–126)
Anion gap: 8 (ref 5–15)
BUN: 9 mg/dL (ref 6–20)
CO2: 27 mmol/L (ref 22–32)
Calcium: 9.1 mg/dL (ref 8.9–10.3)
Chloride: 105 mmol/L (ref 98–111)
Creatinine: 0.51 mg/dL (ref 0.44–1.00)
GFR, Estimated: >60 mL/min (ref >60)
Glucose, Bld: 104 mg/dL — ABNORMAL HIGH (ref 70–99)
Potassium: 3.7 mmol/L (ref 3.5–5.1)
Sodium: 140 mmol/L (ref 135–145)
Total Bilirubin: 0.3 mg/dL (ref 0.3–1.2)
Total Protein: 6.8 g/dL (ref 6.5–8.1)

## 2022-05-08 LAB — LACTATE DEHYDROGENASE: LDH: 251 U/L — ABNORMAL HIGH (ref 98–192)

## 2022-05-08 LAB — FERRITIN: Ferritin: 127 ng/mL (ref 11–307)

## 2022-05-08 MED ORDER — COLD PACK MISC ONCOLOGY: 1.0000 | Freq: Once | Status: DC | PRN

## 2022-05-08 MED ORDER — SODIUM CHLORIDE 0.9 % IV SOLN
150.0000 mg | Freq: Once | INTRAVENOUS | Status: AC
  Administered 2022-05-08: 150 mg via INTRAVENOUS
  Filled 2022-05-08: qty 150

## 2022-05-08 MED ORDER — SODIUM CHLORIDE 0.9 % IV SOLN
148.5000 mg/m2 | Freq: Once | INTRAVENOUS | Status: AC
  Administered 2022-05-08: 280 mg via INTRAVENOUS
  Filled 2022-05-08: qty 10

## 2022-05-08 MED ORDER — SODIUM CHLORIDE 0.9 % IV SOLN
5.0000 mg/kg | Freq: Once | INTRAVENOUS | Status: AC
  Administered 2022-05-08: 400 mg via INTRAVENOUS
  Filled 2022-05-08: qty 16

## 2022-05-08 MED ORDER — LEUCOVORIN CALCIUM INJECTION 350 MG
400.0000 mg/m2 | Freq: Once | INTRAVENOUS | Status: DC
  Filled 2022-05-08: qty 37.8

## 2022-05-08 MED ORDER — HEPARIN SOD (PORK) LOCK FLUSH 100 UNIT/ML IV SOLN: 500.0000 [IU] | Freq: Once | INTRAVENOUS | Status: DC | PRN

## 2022-05-08 MED ORDER — SODIUM CHLORIDE 0.9% FLUSH: 10.0000 mL | INTRAVENOUS | Status: DC | PRN

## 2022-05-08 MED ORDER — SODIUM CHLORIDE 0.9 % IV SOLN
10.0000 mg | Freq: Once | INTRAVENOUS | Status: AC
  Administered 2022-05-08: 10 mg via INTRAVENOUS
  Filled 2022-05-08: qty 10

## 2022-05-08 MED ORDER — PALONOSETRON HCL INJECTION 0.25 MG/5ML
0.2500 mg | Freq: Once | INTRAVENOUS | Status: AC
  Administered 2022-05-08: 0.25 mg via INTRAVENOUS
  Filled 2022-05-08: qty 5

## 2022-05-08 MED ORDER — DEXTROSE 5 % IV SOLN: Freq: Once | INTRAVENOUS | Status: DC

## 2022-05-08 MED ORDER — SODIUM CHLORIDE 0.9 % IV SOLN
2400.0000 mg/m2 | INTRAVENOUS | Status: DC
  Administered 2022-05-08: 4550 mg via INTRAVENOUS
  Filled 2022-05-08: qty 91

## 2022-05-08 MED ORDER — HOT PACK MISC ONCOLOGY: 1.0000 | Freq: Once | Status: DC | PRN

## 2022-05-08 MED ORDER — SODIUM CHLORIDE 0.9 % IV SOLN: Freq: Once | INTRAVENOUS | Status: AC

## 2022-05-08 MED ORDER — SODIUM CHLORIDE 0.9 % IV SOLN
400.0000 mg/m2 | Freq: Once | INTRAVENOUS | Status: AC
  Administered 2022-05-08: 756 mg via INTRAVENOUS
  Filled 2022-05-08: qty 37.8

## 2022-05-08 MED ORDER — ATROPINE SULFATE 1 MG/ML IV SOLN: 0.5000 mg | Freq: Once | INTRAVENOUS | Status: DC | PRN

## 2022-05-08 NOTE — Patient Instructions (Signed)
[facility]  Discharge Instructions: Thank you for choosing [facility] to provide your oncology and hematology care.   If you have a lab appointment with the [facility], please go directly to the [facility] and check in at the registration area.  Wear comfortable clothing and clothing appropriate for easy access to any Portacath or PICC line.   We strive to give you quality time with your provider. You may need to reschedule your appointment if you arrive late (15 or more minutes).  Arriving late affects you and other patients whose appointments are after yours.  Also, if you miss three or more appointments without notifying the office, you may be dismissed from the clinic at the provider's discretion.      For prescription refill requests, have your pharmacy contact our office and allow 72 hours for refills to be completed.    Today you received the following chemotherapy and/or immunotherapy agents MVASI, Leucovorin, Camptosar, 5FU.      To help prevent nausea and vomiting after your treatment, we encourage you to take your nausea medication as directed.  BELOW ARE SYMPTOMS THAT SHOULD BE REPORTED IMMEDIATELY: *FEVER GREATER THAN 100.4 F (38 C) OR HIGHER *CHILLS OR SWEATING *NAUSEA AND VOMITING THAT IS NOT CONTROLLED WITH YOUR NAUSEA MEDICATION *UNUSUAL SHORTNESS OF BREATH *UNUSUAL BRUISING OR BLEEDING *URINARY PROBLEMS (pain or burning when urinating, or frequent urination) *BOWEL PROBLEMS (unusual diarrhea, constipation, pain near the anus) TENDERNESS IN MOUTH AND THROAT WITH OR WITHOUT PRESENCE OF ULCERS (sore throat, sores in mouth, or a toothache) UNUSUAL RASH, SWELLING OR PAIN  UNUSUAL VAGINAL DISCHARGE OR ITCHING   Items with * indicate a potential emergency and should be followed up as soon as possible or go to the Emergency Department if any problems should occur.  Please show the CHEMOTHERAPY ALERT CARD or IMMUNOTHERAPY ALERT  CARD at check-in to the Emergency Department and triage nurse. Should you have questions after your visit or need to cancel or reschedule your appointment, please contact [facility]  [phone] and follow the prompts.  Office hours are 8:00 a.m. to 4:30 p.m. Monday - Friday. Please note that voicemails left after 4:00 p.m. may not be returned until the following business day.  We are closed weekends and major holidays. You have access to a nurse at all times for urgent questions. Please call the main number to the clinic [phone] and follow the prompts.  For any non-urgent questions, you may also contact your provider using MyChart. We now offer e-Visits for anyone 75 and older to request care online for non-urgent symptoms. For details visit [email or web address].   Also download the MyChart app! Go to the app store, search "MyChart", open the app, select [facility], and log in with your MyChart username and password.  Masks are optional in the cancer centers. If you would like for your care team to wear a mask while they are taking care of you, please let them know. You may have one support person who is at least 54 years old accompany you for your appointments.

## 2022-05-08 NOTE — Progress Notes (Signed)
Hematology and Oncology Follow Up Visit  Rebecca Bullock [id number] [birth year] 56 y.o. 54 05/08/2022   Principle Diagnosis:  Metastatic colorectal cancer-liver metastasis- BRAF (+)   Current Therapy:        Status post cycle 1 of chemotherapy with FOLFOXIRI/Avastin Encorafenib/Vectibix -- started on 06/19/2021, s/p cycle #6 -- d/c on 10/31/2021 FOLFOXIRI/Avastin-- s/p cycle  #4-- start on 02/19/2022, -oxaliplatin       dropped on 04/03/2022 IV iron-Ferrlecit given on 01/12/2022               Interim History:  Rebecca Bullock is here today for follow-up.  She did have a nice Thanksgiving.  She was with her family.  She is with her new grandson who was born back in early October.  She did well with the chemotherapy.  Off of oxaliplatin because of the neuropathy issues.  She had the Y-90 procedure on 03/29/2022.  IR following and planning for scan in February.  Her last CEA was down to 9.61.  She has had no cough or shortness of breath.  She had little bit of back discomfort.  She has had problems with constipation alternating with diarrhea.    She has had no fever.  There is been no bleeding.  She has had no urinary issues.  Overall, I would say performance status is probably ECOG 1.    Medications:  Allergies as of 05/08/2022       Reactions   Augmentin [amoxicillin-pot Clavulanate] Diarrhea   Extreme diarrhea, abdominal pain.   Irinotecan Other (See Comments)   Flushing, tingling, visual disturbance   Metronidazole Diarrhea   Vancomycin Rash   ?red syndrome   Tape Rash        Medication List        Accurate as of May 08, 2022 10:09 AM. If you have any questions, ask your nurse or doctor.          Acetaminophen 325 MG Caps Take 650 mg by mouth every 8 (eight) hours as needed for moderate pain or headache.   betamethasone valerate 0.1 % cream Commonly known as: VALISONE Apply 1 application  topically See admin instructions. Apply 1 application topically every other  day as needed for psoriasis flare   cyclobenzaprine 10 MG tablet Commonly known as: FLEXERIL Take 1 tablet (10 mg total) by mouth 3 (three) times daily as needed for muscle spasms.   diphenoxylate-atropine 2.5-0.025 MG tablet Commonly known as: LOMOTIL Take 2 tablets by mouth 4 (four) times daily as needed for diarrhea or loose stools.   estradiol 0.1 MG/GM vaginal cream Commonly known as: ESTRACE Place 1 Applicatorful vaginally daily as needed (dryness / irritation).   hydrocortisone 1 % lotion Apply 1 application topically 2 (two) times daily as needed for itching.   LORazepam 0.5 MG tablet Commonly known as: ATIVAN Take 1 tablet (0.5 mg total) by mouth every 6 (six) hours as needed for anxiety.   omeprazole 20 MG tablet Commonly known as: PRILOSEC OTC Take 20 mg by mouth daily as needed (acid reflux).   traMADol 50 MG tablet Commonly known as: ULTRAM Take 1 tablet (50 mg total) by mouth every 6 (six) hours as needed.        Allergies:  Allergies  Allergen Reactions   Augmentin [Amoxicillin-Pot Clavulanate] Diarrhea    Extreme diarrhea, abdominal pain.   Irinotecan Other (See Comments)    Flushing, tingling, visual disturbance   Metronidazole Diarrhea   Vancomycin Rash    ?red syndrome   Tape  Rash    Past Medical History, Surgical history, Social history, and Family History were reviewed and updated.  Review of Systems: Review of Systems  Constitutional: Negative.   HENT: Negative.    Eyes: Negative.   Respiratory: Negative.    Cardiovascular: Negative.   Gastrointestinal: Negative.        Constipation alternating with diarrhea.   Genitourinary: Negative.   Musculoskeletal: Negative.   Skin: Negative.   Neurological: Negative.   Endo/Heme/Allergies: Negative.   Psychiatric/Behavioral: Negative.       Physical Exam: Her vital signs show temperature of 97.8.  Pulse 92.  Blood pressure is 111/67.  Weight is 184 pounds.  Wt Readings from Last 3  Encounters:  04/23/22 83.9 kg  04/03/22 83.6 kg  03/29/22 83.5 kg    Physical Exam Vitals reviewed.  HENT:     Head: Normocephalic and atraumatic.  Eyes:     Pupils: Pupils are equal, round, and reactive to light.  Cardiovascular:     Rate and Rhythm: Normal rate and regular rhythm.     Heart sounds: Normal heart sounds.  Pulmonary:     Effort: Pulmonary effort is normal.     Breath sounds: Normal breath sounds.  Abdominal:     General: Bowel sounds are normal.     Palpations: Abdomen is soft.  Musculoskeletal:        General: No tenderness or deformity. Normal range of motion.     Cervical back: Normal range of motion.  Lymphadenopathy:     Cervical: No cervical adenopathy.  Skin:    General: Skin is warm and dry.     Findings: No erythema or rash.  Neurological:     Mental Status: She is alert and oriented to person, place, and time.  Psychiatric:        Behavior: Behavior normal.        Thought Content: Thought content normal.        Judgment: Judgment normal.     Lab Results  Component Value Date   WBC 3.6 (L) 05/08/2022   HGB 11.5 (L) 05/08/2022   HCT 36.0 05/08/2022   MCV 92.8 05/08/2022   PLT 147 (L) 05/08/2022   Lab Results  Component Value Date   FERRITIN 188 04/23/2022   IRON 61 04/23/2022   TIBC 350 04/23/2022   UIBC 289 04/23/2022   IRONPCTSAT 17 04/23/2022   Lab Results  Component Value Date   RETICCTPCT 1.4 02/15/2022   RBC 3.88 05/08/2022   No results found for: "KPAFRELGTCHN", "LAMBDASER", "KAPLAMBRATIO" No results found for: "IGGSERUM", "IGA", "IGMSERUM" No results found for: "TOTALPROTELP", "ALBUMINELP", "A1GS", "A2GS", "BETS", "BETA2SER", "GAMS", "MSPIKE", "SPEI"   Chemistry      Component Value Date/Time   NA 141 04/23/2022 0942   K 3.6 04/23/2022 0942   CL 104 04/23/2022 0942   CO2 28 04/23/2022 0942   BUN 6 04/23/2022 0942   CREATININE 0.54 04/23/2022 0942   CREATININE 0.56 03/23/2021 0000      Component Value Date/Time    CALCIUM 9.4 04/23/2022 0942   ALKPHOS 143 (H) 04/23/2022 0942   AST 19 04/23/2022 0942   ALT 12 04/23/2022 0942   BILITOT 0.3 04/23/2022 0942       Impression and Plan: Rebecca Bullock is a very charming  56 yo caucasian femalemale with metastatic colon cancer.  Again, her disease has been confined to her liver.    We are still hoping to get her to surgery.  She had the Y90 procedure  performed recently.  I would think that this will help with what is in her liver.  Hopefully, systemic therapy will help the lymph node that is right outside of the liver.  CEA from today is pending.  We will plan to get her back in 2 weeks for her next cycle of treatment.  IR following and planning to repeat scan in February.    Mikey Bussing, NP 12/12/202310:09 AM

## 2022-05-09 ENCOUNTER — Telehealth: Payer: Self-pay | Admitting: *Deleted

## 2022-05-09 ENCOUNTER — Encounter: Payer: Self-pay | Admitting: *Deleted

## 2022-05-09 NOTE — Telephone Encounter (Signed)
-----   Message from Rebecca Napoleon, MD sent at 05/08/2022  8:10 PM EST ----- Call - the CEA level is lower!!  This is good news!!!   Merry Christmas!!  [name]

## 2022-05-09 NOTE — Progress Notes (Signed)
Oncology Nurse Navigator Documentation     05/09/2022    9:45 AM  Oncology Nurse Navigator Flowsheets  Navigator Follow Up Date: 05/22/2022  Navigator Follow Up Reason: Follow-up Appointment;Chemotherapy  Navigator Location [facility]  Navigator Encounter Type Appt/Treatment Plan Review  Patient Visit Type MedOnc  Treatment Phase Active Tx  Barriers/Navigation Needs No Barriers At This Time  Interventions None Required  Acuity Level 1-No Barriers  Support Groups/Services Friends and Family  Time Spent with Patient 15

## 2022-05-09 NOTE — Telephone Encounter (Signed)
Pt.notified

## 2022-05-10 ENCOUNTER — Inpatient Hospital Stay: Payer: Medicaid Other

## 2022-05-10 VITALS — BP 124/77 | HR 76 | Temp 98.9°F | Resp 17

## 2022-05-10 DIAGNOSIS — D649 Anemia, unspecified: Secondary | ICD-10-CM

## 2022-05-10 DIAGNOSIS — Z5112 Encounter for antineoplastic immunotherapy: Secondary | ICD-10-CM | POA: Diagnosis not present

## 2022-05-10 DIAGNOSIS — D509 Iron deficiency anemia, unspecified: Secondary | ICD-10-CM

## 2022-05-10 DIAGNOSIS — C787 Secondary malignant neoplasm of liver and intrahepatic bile duct: Secondary | ICD-10-CM

## 2022-05-10 MED ORDER — SODIUM CHLORIDE 0.9 % IV SOLN: Freq: Once | INTRAVENOUS | Status: AC

## 2022-05-10 MED ORDER — SODIUM CHLORIDE 0.9 % IV SOLN
125.0000 mg | Freq: Once | INTRAVENOUS | Status: AC
  Administered 2022-05-10: 125 mg via INTRAVENOUS
  Filled 2022-05-10: qty 125

## 2022-05-10 MED ORDER — SODIUM CHLORIDE 0.9% FLUSH: 3.0000 mL | Freq: Once | INTRAVENOUS | Status: DC | PRN

## 2022-05-10 MED ORDER — SODIUM CHLORIDE 0.9% FLUSH: 10.0000 mL | Freq: Once | INTRAVENOUS | Status: DC

## 2022-05-10 MED ORDER — HEPARIN SOD (PORK) LOCK FLUSH 100 UNIT/ML IV SOLN: 500.0000 [IU] | Freq: Once | INTRAVENOUS | Status: DC | PRN

## 2022-05-10 MED ORDER — SODIUM CHLORIDE 0.9% FLUSH
10.0000 mL | INTRAVENOUS | Status: DC | PRN
  Administered 2022-05-10: 10 mL

## 2022-05-10 MED ORDER — HEPARIN SOD (PORK) LOCK FLUSH 100 UNIT/ML IV SOLN
500.0000 [IU] | Freq: Once | INTRAVENOUS | Status: AC
  Administered 2022-05-10: 500 [IU] via INTRAVENOUS

## 2022-05-10 NOTE — Progress Notes (Signed)
Per Dr. [name] order received to hold [place] shot.   Pt declined to stay for post infusion observation period. Pt stated she has tolerated medication multiple times prior without difficulty. Pt aware to call clinic with any questions or concerns. Pt verbalized understanding and had no further questions.

## 2022-05-10 NOTE — Patient Instructions (Signed)
Sodium Ferric Gluconate Complex Injection What is this medication? SODIUM FERRIC GLUCONATE COMPLEX (SOE dee um FER ik GLOO koe nate KOM pleks) treats low levels of iron (iron deficiency anemia) in people with kidney disease. Iron is a mineral that plays an important role in making red blood cells, which carry oxygen from your lungs to the rest of your body. This medicine may be used for other purposes; ask your health care provider or pharmacist if you have questions. COMMON BRAND NAME(S): Ferrlecit, Nulecit What should I tell my care team before I take this medication? They need to know if you have any of the following conditions: Anemia that is not from iron deficiency High levels of iron in the blood An unusual or allergic reaction to iron, other medications, foods, dyes, or preservatives Pregnant or are trying to become pregnant Breast-feeding How should I use this medication? This medication is injected into a vein. It is given by your care team in a hospital or clinic setting. Talk to your care team about the use of this medication in children. While it may be prescribed for children as young as 6 years for selected conditions, precautions do apply. Overdosage: If you think you have taken too much of this medicine contact a poison control center or emergency room at once. NOTE: This medicine is only for you. Do not share this medicine with others. What if I miss a dose? It is important not to miss your dose. Call your care team if you are unable to keep an appointment. What may interact with this medication? Do not take this medication with any of the following: Deferasirox Deferoxamine Dimercaprol This medication may also interact with the following: Other iron products This list may not describe all possible interactions. Give your health care provider a list of all the medicines, herbs, non-prescription drugs, or dietary supplements you use. Also tell them if you smoke, drink  alcohol, or use illegal drugs. Some items may interact with your medicine. What should I watch for while using this medication? Your condition will be monitored carefully while you are receiving this medication. Visit your care team for regular checks on your progress. You may need blood work while you are taking this medication. What side effects may I notice from receiving this medication? Side effects that you should report to your care team as soon as possible: Allergic reactions--skin rash, itching, hives, swelling of the face, lips, tongue, or throat Low blood pressure--dizziness, feeling faint or lightheaded, blurry vision Shortness of breath Side effects that usually do not require medical attention (report to your care team if they continue or are bothersome): Flushing Headache Joint pain Muscle pain Nausea Pain, redness, or irritation at injection site This list may not describe all possible side effects. Call your doctor for medical advice about side effects. You may report side effects to FDA at 1-800-FDA-1088. Where should I keep my medication? This medication is given in a hospital or clinic and will not be stored at home. NOTE: This sheet is a summary. It may not cover all possible information. If you have questions about this medicine, talk to your doctor, pharmacist, or health care provider.  2023 Elsevier/Gold Standard (2007-07-05 00:00:00) 

## 2022-05-22 ENCOUNTER — Inpatient Hospital Stay: Payer: Medicaid Other

## 2022-05-22 ENCOUNTER — Ambulatory Visit: Payer: Medicaid Other

## 2022-05-22 ENCOUNTER — Encounter: Payer: Self-pay | Admitting: Hematology & Oncology

## 2022-05-22 ENCOUNTER — Inpatient Hospital Stay (HOSPITAL_BASED_OUTPATIENT_CLINIC_OR_DEPARTMENT_OTHER): Payer: Medicaid Other | Admitting: Hematology & Oncology

## 2022-05-22 VITALS — BP 110/79 | HR 98 | Temp 98.6°F | Resp 20 | Ht 61.0 in | Wt 184.1 lb

## 2022-05-22 DIAGNOSIS — C189 Malignant neoplasm of colon, unspecified: Secondary | ICD-10-CM

## 2022-05-22 DIAGNOSIS — Z5112 Encounter for antineoplastic immunotherapy: Secondary | ICD-10-CM | POA: Diagnosis not present

## 2022-05-22 DIAGNOSIS — C787 Secondary malignant neoplasm of liver and intrahepatic bile duct: Secondary | ICD-10-CM

## 2022-05-22 LAB — CMP (CANCER CENTER ONLY)
ALT: 16 U/L (ref 0–44)
AST: 23 U/L (ref 15–41)
Albumin: 4 g/dL (ref 3.5–5.0)
Alkaline Phosphatase: 141 U/L — ABNORMAL HIGH (ref 38–126)
Anion gap: 9 (ref 5–15)
BUN: 6 mg/dL (ref 6–20)
CO2: 26 mmol/L (ref 22–32)
Calcium: 9.1 mg/dL (ref 8.9–10.3)
Chloride: 105 mmol/L (ref 98–111)
Creatinine: 0.59 mg/dL (ref 0.44–1.00)
GFR, Estimated: >60 mL/min (ref >60)
Glucose, Bld: 105 mg/dL — ABNORMAL HIGH (ref 70–99)
Potassium: 3.8 mmol/L (ref 3.5–5.1)
Sodium: 140 mmol/L (ref 135–145)
Total Bilirubin: 0.3 mg/dL (ref 0.3–1.2)
Total Protein: 6.6 g/dL (ref 6.5–8.1)

## 2022-05-22 LAB — CBC WITH DIFFERENTIAL (CANCER CENTER ONLY)
Abs Immature Granulocytes: 0.01 10*3/uL (ref 0.00–0.07)
Basophils Absolute: 0 10*3/uL (ref 0.0–0.1)
Basophils Relative: 0 %
Eosinophils Absolute: 0.1 10*3/uL (ref 0.0–0.5)
Eosinophils Relative: 4 %
HCT: 36.9 % (ref 36.0–46.0)
Hemoglobin: 11.9 g/dL — ABNORMAL LOW (ref 12.0–15.0)
Immature Granulocytes: 0 %
Lymphocytes Relative: 27 %
Lymphs Abs: 0.8 10*3/uL (ref 0.7–4.0)
MCH: 29.8 pg (ref 26.0–34.0)
MCHC: 32.2 g/dL (ref 30.0–36.0)
MCV: 92.3 fL (ref 80.0–100.0)
Monocytes Absolute: 0.3 10*3/uL (ref 0.1–1.0)
Monocytes Relative: 12 %
Neutro Abs: 1.7 10*3/uL (ref 1.7–7.7)
Neutrophils Relative %: 57 %
Platelet Count: 128 10*3/uL — ABNORMAL LOW (ref 150–400)
RBC: 4 MIL/uL (ref 3.87–5.11)
RDW: 17.2 % — ABNORMAL HIGH (ref 11.5–15.5)
WBC Count: 2.9 10*3/uL — ABNORMAL LOW (ref 4.0–10.5)
nRBC: 0 % (ref 0.0–0.2)

## 2022-05-22 LAB — TOTAL PROTEIN, URINE DIPSTICK: Protein, ur: NEGATIVE mg/dL

## 2022-05-22 MED ORDER — SODIUM CHLORIDE 0.9 % IV SOLN
10.0000 mg | Freq: Once | INTRAVENOUS | Status: AC
  Administered 2022-05-22: 10 mg via INTRAVENOUS
  Filled 2022-05-22: qty 1

## 2022-05-22 MED ORDER — SODIUM CHLORIDE 0.9 % IV SOLN
2400.0000 mg/m2 | INTRAVENOUS | Status: DC
  Administered 2022-05-22: 4550 mg via INTRAVENOUS
  Filled 2022-05-22: qty 91

## 2022-05-22 MED ORDER — SODIUM CHLORIDE 0.9 % IV SOLN
150.0000 mg | Freq: Once | INTRAVENOUS | Status: AC
  Administered 2022-05-22: 150 mg via INTRAVENOUS
  Filled 2022-05-22: qty 150

## 2022-05-22 MED ORDER — PALONOSETRON HCL INJECTION 0.25 MG/5ML
0.2500 mg | Freq: Once | INTRAVENOUS | Status: AC
  Administered 2022-05-22: 0.25 mg via INTRAVENOUS

## 2022-05-22 MED ORDER — COLD PACK MISC ONCOLOGY: 1.0000 | Freq: Once | Status: DC | PRN

## 2022-05-22 MED ORDER — SODIUM CHLORIDE 0.9% FLUSH: 10.0000 mL | INTRAVENOUS | Status: DC | PRN

## 2022-05-22 MED ORDER — HOT PACK MISC ONCOLOGY: 1.0000 | Freq: Once | Status: DC | PRN

## 2022-05-22 MED ORDER — HEPARIN SOD (PORK) LOCK FLUSH 100 UNIT/ML IV SOLN: 250.0000 [IU] | Freq: Once | INTRAVENOUS | Status: DC | PRN

## 2022-05-22 MED ORDER — SODIUM CHLORIDE 0.9 % IV SOLN
148.5000 mg/m2 | Freq: Once | INTRAVENOUS | Status: AC
  Administered 2022-05-22: 280 mg via INTRAVENOUS
  Filled 2022-05-22: qty 10

## 2022-05-22 MED ORDER — SODIUM CHLORIDE 0.9 % IV SOLN
400.0000 mg/m2 | Freq: Once | INTRAVENOUS | Status: AC
  Administered 2022-05-22: 756 mg via INTRAVENOUS
  Filled 2022-05-22: qty 37.8

## 2022-05-22 MED ORDER — ATROPINE SULFATE 1 MG/ML IV SOLN
0.5000 mg | Freq: Once | INTRAVENOUS | Status: AC | PRN
  Administered 2022-05-22: 0.5 mg via INTRAVENOUS

## 2022-05-22 MED ORDER — SODIUM CHLORIDE 0.9 % IV SOLN
5.0000 mg/kg | Freq: Once | INTRAVENOUS | Status: AC
  Administered 2022-05-22: 400 mg via INTRAVENOUS
  Filled 2022-05-22: qty 16

## 2022-05-22 MED ORDER — ALTEPLASE 2 MG IJ SOLR: 2.0000 mg | Freq: Once | INTRAMUSCULAR | Status: DC | PRN

## 2022-05-22 MED ORDER — DEXTROSE 5 % IV SOLN: Freq: Once | INTRAVENOUS | Status: DC

## 2022-05-22 MED ORDER — HEPARIN SOD (PORK) LOCK FLUSH 100 UNIT/ML IV SOLN: 500.0000 [IU] | Freq: Once | INTRAVENOUS | Status: DC | PRN

## 2022-05-22 MED ORDER — SODIUM CHLORIDE 0.9% FLUSH: 3.0000 mL | INTRAVENOUS | Status: DC | PRN

## 2022-05-22 MED ORDER — SODIUM CHLORIDE 0.9 % IV SOLN: Freq: Once | INTRAVENOUS | Status: AC

## 2022-05-22 NOTE — Patient Instructions (Signed)

## 2022-05-22 NOTE — Progress Notes (Signed)
Hematology and Oncology Follow Up Visit  Rebecca Bullock [id number] 1965/05/30 56 y.o. 05/22/2022   Principle Diagnosis:  Metastatic colorectal cancer-liver metastasis- BRAF (+)   Current Therapy:        Status post cycle 1 of chemotherapy with FOLFOXIRI/Avastin Encorafenib/Vectibix -- started on 06/19/2021, s/p cycle #6 -- d/c on 10/31/2021 FOLFOXIRI/Avastin-- s/p cycle  #5-- start on 02/19/2022, -oxaliplatin       dropped on 04/03/2022 IV iron-Ferrlecit given on 01/12/2022               Interim History:  Rebecca Bullock is here today for follow-up.  She did have a very nice Christmas.  She had a boyfriend went to [place] to the [place].  They really enjoyed themselves.  She is feeling okay.  Unfortunate, she cannot see her new grandbaby over Christmas because the grandbaby's father had COVID.  She has had no problems with nausea or vomiting.  She is having little bit of discomfort over on the right flank.  There is no dysuria.  She has had no fever.  There is been no bleeding.  Her last CEA level was down to 8.5.  She has had no leg swelling.  She has had no rashes.  There is been no cough or shortness of breath.  Omitting the oxaliplatin really has helped her.  She did not have any neuropathy in her hands or feet.  Overall, I would have said that her performance status is probably ECOG 1.     Medications:  Allergies as of 05/22/2022       Reactions   Augmentin [amoxicillin-pot Clavulanate] Diarrhea   Extreme diarrhea, abdominal pain.   Irinotecan Other (See Comments)   Flushing, tingling, visual disturbance   Metronidazole Diarrhea   Vancomycin Rash   ?red syndrome   Tape Rash        Medication List        Accurate as of May 22, 2022 10:27 AM. If you have any questions, ask your nurse or doctor.          STOP taking these medications    LORazepam 0.5 MG tablet Commonly known as: ATIVAN Stopped by: [name], MD       TAKE these  medications    Acetaminophen 325 MG Caps Take 650 mg by mouth every 8 (eight) hours as needed for moderate pain or headache.   betamethasone valerate 0.1 % cream Commonly known as: VALISONE Apply 1 application  topically See admin instructions. Apply 1 application topically every other day as needed for psoriasis flare   cyclobenzaprine 10 MG tablet Commonly known as: FLEXERIL Take 1 tablet (10 mg total) by mouth 3 (three) times daily as needed for muscle spasms.   diphenoxylate-atropine 2.5-0.025 MG tablet Commonly known as: LOMOTIL Take 2 tablets by mouth 4 (four) times daily as needed for diarrhea or loose stools.   estradiol 0.1 MG/GM vaginal cream Commonly known as: ESTRACE Place 1 Applicatorful vaginally daily as needed (dryness / irritation).   hydrocortisone 1 % lotion Apply 1 application topically 2 (two) times daily as needed for itching.   omeprazole 20 MG tablet Commonly known as: PRILOSEC OTC Take 20 mg by mouth daily as needed (acid reflux).   traMADol 50 MG tablet Commonly known as: ULTRAM Take 1 tablet (50 mg total) by mouth every 6 (six) hours as needed.        Allergies:  Allergies  Allergen Reactions   Augmentin [Amoxicillin-Pot Clavulanate] Diarrhea    Extreme  diarrhea, abdominal pain.   Irinotecan Other (See Comments)    Flushing, tingling, visual disturbance   Metronidazole Diarrhea   Vancomycin Rash    ?red syndrome   Tape Rash    Past Medical History, Surgical history, Social history, and Family History were reviewed and updated.  Review of Systems: Review of Systems  Constitutional: Negative.   HENT: Negative.    Eyes: Negative.   Respiratory: Negative.    Cardiovascular: Negative.   Gastrointestinal: Negative.        Constipation alternating with diarrhea.   Genitourinary: Negative.   Musculoskeletal: Negative.   Skin: Negative.   Neurological: Negative.   Endo/Heme/Allergies: Negative.   Psychiatric/Behavioral: Negative.        Physical Exam: Her vital signs show temperature of 97.8.  Pulse 92.  Blood pressure is 111/67.  Weight is 184 pounds.  Wt Readings from Last 3 Encounters:  05/22/22 184 lb 1.9 oz (83.5 kg)  04/23/22 185 lb (83.9 kg)  04/03/22 184 lb 6.4 oz (83.6 kg)    Physical Exam Vitals reviewed.  HENT:     Head: Normocephalic and atraumatic.  Eyes:     Pupils: Pupils are equal, round, and reactive to light.  Cardiovascular:     Rate and Rhythm: Normal rate and regular rhythm.     Heart sounds: Normal heart sounds.  Pulmonary:     Effort: Pulmonary effort is normal.     Breath sounds: Normal breath sounds.  Abdominal:     General: Bowel sounds are normal.     Palpations: Abdomen is soft.  Musculoskeletal:        General: No tenderness or deformity. Normal range of motion.     Cervical back: Normal range of motion.  Lymphadenopathy:     Cervical: No cervical adenopathy.  Skin:    General: Skin is warm and dry.     Findings: No erythema or rash.  Neurological:     Mental Status: She is alert and oriented to person, place, and time.  Psychiatric:        Behavior: Behavior normal.        Thought Content: Thought content normal.        Judgment: Judgment normal.      Lab Results  Component Value Date   WBC 2.9 (L) 05/22/2022   HGB 11.9 (L) 05/22/2022   HCT 36.9 05/22/2022   MCV 92.3 05/22/2022   PLT 128 (L) 05/22/2022   Lab Results  Component Value Date   FERRITIN 127 05/08/2022   IRON 45 05/08/2022   TIBC 349 05/08/2022   UIBC 304 05/08/2022   IRONPCTSAT 13 05/08/2022   Lab Results  Component Value Date   RETICCTPCT 1.4 02/15/2022   RBC 4.00 05/22/2022   No results found for: "KPAFRELGTCHN", "LAMBDASER", "KAPLAMBRATIO" No results found for: "IGGSERUM", "IGA", "IGMSERUM" No results found for: "TOTALPROTELP", "ALBUMINELP", "A1GS", "A2GS", "BETS", "BETA2SER", "GAMS", "MSPIKE", "SPEI"   Chemistry      Component Value Date/Time   NA 140 05/22/2022 0914   K 3.8  05/22/2022 0914   CL 105 05/22/2022 0914   CO2 26 05/22/2022 0914   BUN 6 05/22/2022 0914   CREATININE 0.59 05/22/2022 0914   CREATININE 0.56 03/23/2021 0000      Component Value Date/Time   CALCIUM 9.1 05/22/2022 0914   ALKPHOS 141 (H) 05/22/2022 0914   AST 23 05/22/2022 0914   ALT 16 05/22/2022 0914   BILITOT 0.3 05/22/2022 0914       Impression and Plan:  Rebecca Bullock is a very charming  56 yo caucasian female with metastatic colon cancer.  Again, her disease has been confined to her liver.    We are still hoping to get her to surgery.  She had the Y90 procedure performed recently.  I would think that this will help with what is in her liver.  I will have to go ahead and plan for another follow-up scan.  I realize that Interesting that radiology wants to do 1 in February.  I really think we need to have 1 before hand.  I know that there has been some concern about doing surgery because of a lymph node that was outside of the liver.  Hopefully, with the CEA trending downward, this lymph node will have shown treatment.  Rebecca Bullock still is in great shape.  We really need to be aggressive.   [name], MD 12/26/202310:27 AM

## 2022-05-22 NOTE — Patient Instructions (Signed)
[facility]  Discharge Instructions: Thank you for choosing [facility] to provide your oncology and hematology care.   If you have a lab appointment with the [facility], please go directly to the [facility] and check in at the registration area.  Wear comfortable clothing and clothing appropriate for easy access to any Portacath or PICC line.   We strive to give you quality time with your provider. You may need to reschedule your appointment if you arrive late (15 or more minutes).  Arriving late affects you and other patients whose appointments are after yours.  Also, if you miss three or more appointments without notifying the office, you may be dismissed from the clinic at the provider's discretion.      For prescription refill requests, have your pharmacy contact our office and allow 72 hours for refills to be completed.    Today you received the following chemotherapy and/or immunotherapy agents Leucovorin, Irinotecan, 5FU.      To help prevent nausea and vomiting after your treatment, we encourage you to take your nausea medication as directed.  BELOW ARE SYMPTOMS THAT SHOULD BE REPORTED IMMEDIATELY: *FEVER GREATER THAN 100.4 F (38 C) OR HIGHER *CHILLS OR SWEATING *NAUSEA AND VOMITING THAT IS NOT CONTROLLED WITH YOUR NAUSEA MEDICATION *UNUSUAL SHORTNESS OF BREATH *UNUSUAL BRUISING OR BLEEDING *URINARY PROBLEMS (pain or burning when urinating, or frequent urination) *BOWEL PROBLEMS (unusual diarrhea, constipation, pain near the anus) TENDERNESS IN MOUTH AND THROAT WITH OR WITHOUT PRESENCE OF ULCERS (sore throat, sores in mouth, or a toothache) UNUSUAL RASH, SWELLING OR PAIN  UNUSUAL VAGINAL DISCHARGE OR ITCHING   Items with * indicate a potential emergency and should be followed up as soon as possible or go to the Emergency Department if any problems should occur.  Please show the CHEMOTHERAPY ALERT CARD or IMMUNOTHERAPY ALERT CARD  at check-in to the Emergency Department and triage nurse. Should you have questions after your visit or need to cancel or reschedule your appointment, please contact [facility]  [phone] and follow the prompts.  Office hours are 8:00 a.m. to 4:30 p.m. Monday - Friday. Please note that voicemails left after 4:00 p.m. may not be returned until the following business day.  We are closed weekends and major holidays. You have access to a nurse at all times for urgent questions. Please call the main number to the clinic [phone] and follow the prompts.  For any non-urgent questions, you may also contact your provider using MyChart. We now offer e-Visits for anyone 57 and older to request care online for non-urgent symptoms. For details visit [email or web address].   Also download the MyChart app! Go to the app store, search "MyChart", open the app, select [facility], and log in with your MyChart username and password.  Masks are optional in the cancer centers. If you would like for your care team to wear a mask while they are taking care of you, please let them know. You may have one support person who is at least 54 years old accompany you for your appointments.

## 2022-05-24 ENCOUNTER — Other Ambulatory Visit: Payer: Self-pay

## 2022-05-24 ENCOUNTER — Inpatient Hospital Stay: Payer: Medicaid Other

## 2022-05-24 VITALS — BP 125/82 | HR 93 | Temp 97.8°F | Resp 18

## 2022-05-24 DIAGNOSIS — C189 Malignant neoplasm of colon, unspecified: Secondary | ICD-10-CM

## 2022-05-24 DIAGNOSIS — Z5112 Encounter for antineoplastic immunotherapy: Secondary | ICD-10-CM | POA: Diagnosis not present

## 2022-05-24 MED ORDER — SODIUM CHLORIDE 0.9% FLUSH
10.0000 mL | INTRAVENOUS | Status: DC | PRN
  Administered 2022-05-24: 10 mL

## 2022-05-24 MED ORDER — PEGFILGRASTIM-CBQV 6 MG/0.6ML ~~LOC~~ SOSY
6.0000 mg | PREFILLED_SYRINGE | Freq: Once | SUBCUTANEOUS | Status: AC
  Administered 2022-05-24: 6 mg via SUBCUTANEOUS
  Filled 2022-05-24: qty 0.6

## 2022-05-24 MED ORDER — HEPARIN SOD (PORK) LOCK FLUSH 100 UNIT/ML IV SOLN
500.0000 [IU] | Freq: Once | INTRAVENOUS | Status: AC | PRN
  Administered 2022-05-24: 500 [IU]

## 2022-05-24 NOTE — Patient Instructions (Signed)
[facility]  Discharge Instructions: Thank you for choosing [facility] to provide your oncology and hematology care.   If you have a lab appointment with the [facility], please go directly to the [facility] and check in at the registration area.  Wear comfortable clothing and clothing appropriate for easy access to any Portacath or PICC line.   We strive to give you quality time with your provider. You may need to reschedule your appointment if you arrive late (15 or more minutes).  Arriving late affects you and other patients whose appointments are after yours.  Also, if you miss three or more appointments without notifying the office, you may be dismissed from the clinic at the provider's discretion.      For prescription refill requests, have your pharmacy contact our office and allow 72 hours for refills to be completed.    Today you received the following chemotherapy and/or immunotherapy agents 5FU      To help prevent nausea and vomiting after your treatment, we encourage you to take your nausea medication as directed.  BELOW ARE SYMPTOMS THAT SHOULD BE REPORTED IMMEDIATELY: *FEVER GREATER THAN 100.4 F (38 C) OR HIGHER *CHILLS OR SWEATING *NAUSEA AND VOMITING THAT IS NOT CONTROLLED WITH YOUR NAUSEA MEDICATION *UNUSUAL SHORTNESS OF BREATH *UNUSUAL BRUISING OR BLEEDING *URINARY PROBLEMS (pain or burning when urinating, or frequent urination) *BOWEL PROBLEMS (unusual diarrhea, constipation, pain near the anus) TENDERNESS IN MOUTH AND THROAT WITH OR WITHOUT PRESENCE OF ULCERS (sore throat, sores in mouth, or a toothache) UNUSUAL RASH, SWELLING OR PAIN  UNUSUAL VAGINAL DISCHARGE OR ITCHING   Items with * indicate a potential emergency and should be followed up as soon as possible or go to the Emergency Department if any problems should occur.  Please show the CHEMOTHERAPY ALERT CARD or IMMUNOTHERAPY ALERT CARD at check-in to the  Emergency Department and triage nurse. Should you have questions after your visit or need to cancel or reschedule your appointment, please contact [facility]  [phone] and follow the prompts.  Office hours are 8:00 a.m. to 4:30 p.m. Monday - Friday. Please note that voicemails left after 4:00 p.m. may not be returned until the following business day.  We are closed weekends and major holidays. You have access to a nurse at all times for urgent questions. Please call the main number to the clinic [phone] and follow the prompts.  For any non-urgent questions, you may also contact your provider using MyChart. We now offer e-Visits for anyone 23 and older to request care online for non-urgent symptoms. For details visit [email or web address].   Also download the MyChart app! Go to the app store, search "MyChart", open the app, select [facility], and log in with your MyChart username and password.

## 2022-05-29 ENCOUNTER — Encounter: Payer: Self-pay | Admitting: *Deleted

## 2022-05-29 NOTE — Progress Notes (Signed)
Patient will need an MRI prior to her next cycle. Scheduled for 06/08/22.  Oncology Nurse Navigator Documentation     05/29/2022    8:45 AM  Oncology Nurse Navigator Flowsheets  Navigator Follow Up Date: 06/08/2022  Navigator Follow Up Reason: Scan Review  Navigator Location [facility]  Navigator Encounter Type Appt/Treatment Plan Review  Patient Visit Type MedOnc  Treatment Phase Active Tx  Barriers/Navigation Needs No Barriers At This Time  Interventions None Required  Acuity Level 1-No Barriers  Support Groups/Services Friends and Family  Time Spent with Patient 15

## 2022-06-05 NOTE — Progress Notes (Signed)
Bevacizumab changed to [facility] per current pharmacy formulary.

## 2022-06-07 ENCOUNTER — Telehealth: Payer: Self-pay | Admitting: *Deleted

## 2022-06-07 ENCOUNTER — Other Ambulatory Visit: Payer: Self-pay

## 2022-06-07 ENCOUNTER — Ambulatory Visit (HOSPITAL_COMMUNITY)
Admission: RE | Admit: 2022-06-07 | Discharge: 2022-06-07 | Disposition: A | Discharge status: Home / Self Care | Payer: Medicaid Other | Source: Ambulatory Visit | Attending: Hematology & Oncology | Admitting: Hematology & Oncology

## 2022-06-07 ENCOUNTER — Other Ambulatory Visit: Payer: Self-pay | Admitting: *Deleted

## 2022-06-07 DIAGNOSIS — C787 Secondary malignant neoplasm of liver and intrahepatic bile duct: Secondary | ICD-10-CM | POA: Diagnosis present

## 2022-06-07 DIAGNOSIS — Z95828 Presence of other vascular implants and grafts: Secondary | ICD-10-CM | POA: Diagnosis present

## 2022-06-07 DIAGNOSIS — D509 Iron deficiency anemia, unspecified: Secondary | ICD-10-CM

## 2022-06-07 DIAGNOSIS — C189 Malignant neoplasm of colon, unspecified: Secondary | ICD-10-CM

## 2022-06-07 DIAGNOSIS — D649 Anemia, unspecified: Secondary | ICD-10-CM | POA: Insufficient documentation

## 2022-06-07 NOTE — Telephone Encounter (Signed)
Call received from patient stating that she is having issues with constipation.  She states that she had a "little bit" of a BM yesterday and then a "little bit" of a BM on Sunday and is passing little gas. She has taken Miralax daily on Tuesday, Wednesday and Thursday of this week. She states that a new pain started to her right side on Saturday night which has now radiated to her lower back. Dr. [name] notified.  Call placed back to patient and patient notified that Dr. [name] would like for her to come in today to [facility] for an Abd 2 view xray.  Pt states that she will come in today for xray and will be here as soon as she can.  Dr. [name] notified.

## 2022-06-07 NOTE — Telephone Encounter (Signed)
Call placed to patient to notify her per order of Dr. [name] that the abd xray shows constipation and that Dr. [name] would like for her to take one tablespoon of olive oil twice a day.  Pt states that she does not think that she can tolerate olive oil and that she will try Dulcolax tablets.  She is appreciative of call and states that if the Dulcolax tablets to do not work, she will try the olive oil.  Dr. [name] notified.

## 2022-06-08 ENCOUNTER — Ambulatory Visit (HOSPITAL_COMMUNITY)
Admission: RE | Admit: 2022-06-08 | Discharge: 2022-06-08 | Disposition: A | Discharge status: Home / Self Care | Payer: Medicaid Other | Source: Ambulatory Visit | Attending: Hematology & Oncology | Admitting: Hematology & Oncology

## 2022-06-08 ENCOUNTER — Encounter: Payer: Self-pay | Admitting: *Deleted

## 2022-06-08 DIAGNOSIS — C189 Malignant neoplasm of colon, unspecified: Secondary | ICD-10-CM | POA: Diagnosis not present

## 2022-06-08 DIAGNOSIS — C787 Secondary malignant neoplasm of liver and intrahepatic bile duct: Secondary | ICD-10-CM

## 2022-06-08 MED ORDER — GADOBUTROL 1 MMOL/ML IV SOLN
8.0000 mL | Freq: Once | INTRAVENOUS | Status: AC | PRN
  Administered 2022-06-08: 8 mL via INTRAVENOUS

## 2022-06-08 NOTE — Progress Notes (Signed)
Reviewed MRI which disease progression.   Oncology Nurse Navigator Documentation     06/08/2022    2:00 PM  Oncology Nurse Navigator Flowsheets  Navigator Follow Up Date: 06/12/2022  Navigator Follow Up Reason: Follow-up Appointment  Navigator Location [facility]  Navigator Encounter Type Scan Review  Patient Visit Type MedOnc  Treatment Phase Active Tx  Barriers/Navigation Needs No Barriers At This Time  Interventions None Required  Acuity Level 1-No Barriers  Support Groups/Services Friends and Family  Time Spent with Patient 15

## 2022-06-12 ENCOUNTER — Encounter: Payer: Self-pay | Admitting: Hematology & Oncology

## 2022-06-12 ENCOUNTER — Inpatient Hospital Stay: Payer: Medicaid Other

## 2022-06-12 ENCOUNTER — Inpatient Hospital Stay: Payer: Medicaid Other | Attending: Hematology & Oncology

## 2022-06-12 ENCOUNTER — Encounter: Payer: Self-pay | Admitting: *Deleted

## 2022-06-12 ENCOUNTER — Other Ambulatory Visit: Payer: Self-pay

## 2022-06-12 ENCOUNTER — Inpatient Hospital Stay (HOSPITAL_BASED_OUTPATIENT_CLINIC_OR_DEPARTMENT_OTHER): Payer: Medicaid Other | Admitting: Hematology & Oncology

## 2022-06-12 VITALS — HR 99

## 2022-06-12 VITALS — BP 138/79 | HR 111 | Temp 98.2°F | Resp 18 | Ht 61.0 in | Wt 184.0 lb

## 2022-06-12 DIAGNOSIS — C182 Malignant neoplasm of ascending colon: Secondary | ICD-10-CM | POA: Insufficient documentation

## 2022-06-12 DIAGNOSIS — Z5189 Encounter for other specified aftercare: Secondary | ICD-10-CM | POA: Diagnosis not present

## 2022-06-12 DIAGNOSIS — C189 Malignant neoplasm of colon, unspecified: Secondary | ICD-10-CM

## 2022-06-12 DIAGNOSIS — Z79899 Other long term (current) drug therapy: Secondary | ICD-10-CM | POA: Diagnosis not present

## 2022-06-12 DIAGNOSIS — C787 Secondary malignant neoplasm of liver and intrahepatic bile duct: Secondary | ICD-10-CM | POA: Insufficient documentation

## 2022-06-12 DIAGNOSIS — Z5111 Encounter for antineoplastic chemotherapy: Secondary | ICD-10-CM | POA: Diagnosis present

## 2022-06-12 DIAGNOSIS — Z452 Encounter for adjustment and management of vascular access device: Secondary | ICD-10-CM | POA: Insufficient documentation

## 2022-06-12 DIAGNOSIS — Z5112 Encounter for antineoplastic immunotherapy: Secondary | ICD-10-CM | POA: Insufficient documentation

## 2022-06-12 LAB — CMP (CANCER CENTER ONLY)
ALT: 16 U/L (ref 0–44)
AST: 24 U/L (ref 15–41)
Albumin: 4.2 g/dL (ref 3.5–5.0)
Alkaline Phosphatase: 153 U/L — ABNORMAL HIGH (ref 38–126)
Anion gap: 11 (ref 5–15)
BUN: 8 mg/dL (ref 6–20)
CO2: 28 mmol/L (ref 22–32)
Calcium: 9.9 mg/dL (ref 8.9–10.3)
Chloride: 102 mmol/L (ref 98–111)
Creatinine: 0.6 mg/dL (ref 0.44–1.00)
GFR, Estimated: >60 mL/min (ref >60)
Glucose, Bld: 125 mg/dL — ABNORMAL HIGH (ref 70–99)
Potassium: 3.5 mmol/L (ref 3.5–5.1)
Sodium: 141 mmol/L (ref 135–145)
Total Bilirubin: 0.3 mg/dL (ref 0.3–1.2)
Total Protein: 7.4 g/dL (ref 6.5–8.1)

## 2022-06-12 LAB — CBC WITH DIFFERENTIAL (CANCER CENTER ONLY)
Abs Immature Granulocytes: 0.01 10*3/uL (ref 0.00–0.07)
Basophils Absolute: 0 10*3/uL (ref 0.0–0.1)
Basophils Relative: 0 %
Eosinophils Absolute: 0.1 10*3/uL (ref 0.0–0.5)
Eosinophils Relative: 2 %
HCT: 40.5 % (ref 36.0–46.0)
Hemoglobin: 12.9 g/dL (ref 12.0–15.0)
Immature Granulocytes: 0 %
Lymphocytes Relative: 21 %
Lymphs Abs: 1 10*3/uL (ref 0.7–4.0)
MCH: 29.9 pg (ref 26.0–34.0)
MCHC: 31.9 g/dL (ref 30.0–36.0)
MCV: 94 fL (ref 80.0–100.0)
Monocytes Absolute: 0.5 10*3/uL (ref 0.1–1.0)
Monocytes Relative: 10 %
Neutro Abs: 3.3 10*3/uL (ref 1.7–7.7)
Neutrophils Relative %: 67 %
Platelet Count: 192 10*3/uL (ref 150–400)
RBC: 4.31 MIL/uL (ref 3.87–5.11)
RDW: 17.1 % — ABNORMAL HIGH (ref 11.5–15.5)
WBC Count: 4.9 10*3/uL (ref 4.0–10.5)
nRBC: 0 % (ref 0.0–0.2)

## 2022-06-12 LAB — LACTATE DEHYDROGENASE: LDH: 302 U/L — ABNORMAL HIGH (ref 98–192)

## 2022-06-12 LAB — RETICULOCYTES
Immature Retic Fract: 7.9 % (ref 2.3–15.9)
RBC.: 4.23 MIL/uL (ref 3.87–5.11)
Retic Count, Absolute: 58.4 10*3/uL (ref 19.0–186.0)
Retic Ct Pct: 1.4 % (ref 0.4–3.1)

## 2022-06-12 LAB — FERRITIN: Ferritin: 216 ng/mL (ref 11–307)

## 2022-06-12 LAB — IRON AND IRON BINDING CAPACITY (CC-WL,HP ONLY)
Iron: 73 ug/dL (ref 28–170)
Saturation Ratios: 20 % (ref 10.4–31.8)
TIBC: 361 ug/dL (ref 250–450)
UIBC: 288 ug/dL (ref 148–442)

## 2022-06-12 LAB — TOTAL PROTEIN, URINE DIPSTICK: Protein, ur: NEGATIVE mg/dL

## 2022-06-12 LAB — CEA (IN HOUSE-CHCC): CEA (CHCC-In House): 10.57 ng/mL — ABNORMAL HIGH (ref 0.00–5.00)

## 2022-06-12 MED ORDER — SODIUM CHLORIDE 0.9 % IV SOLN: Freq: Once | INTRAVENOUS | Status: AC

## 2022-06-12 MED ORDER — SODIUM CHLORIDE 0.9 % IV SOLN
5.0000 mg/kg | Freq: Once | INTRAVENOUS | Status: AC
  Administered 2022-06-12: 400 mg via INTRAVENOUS
  Filled 2022-06-12: qty 16

## 2022-06-12 MED ORDER — ATROPINE SULFATE 1 MG/ML IV SOLN: 0.5000 mg | Freq: Once | INTRAVENOUS | Status: DC | PRN

## 2022-06-12 MED ORDER — HEPARIN SOD (PORK) LOCK FLUSH 100 UNIT/ML IV SOLN: 500.0000 [IU] | Freq: Once | INTRAVENOUS | Status: DC | PRN

## 2022-06-12 MED ORDER — SODIUM CHLORIDE 0.9 % IV SOLN
10.0000 mg | Freq: Once | INTRAVENOUS | Status: AC
  Administered 2022-06-12: 10 mg via INTRAVENOUS
  Filled 2022-06-12: qty 10

## 2022-06-12 MED ORDER — SODIUM CHLORIDE 0.9 % IV SOLN
148.5000 mg/m2 | Freq: Once | INTRAVENOUS | Status: AC
  Administered 2022-06-12: 280 mg via INTRAVENOUS
  Filled 2022-06-12: qty 10

## 2022-06-12 MED ORDER — SODIUM CHLORIDE 0.9 % IV SOLN
400.0000 mg/m2 | Freq: Once | INTRAVENOUS | Status: AC
  Administered 2022-06-12: 756 mg via INTRAVENOUS
  Filled 2022-06-12: qty 37.8

## 2022-06-12 MED ORDER — SODIUM CHLORIDE 0.9 % IV SOLN
150.0000 mg | Freq: Once | INTRAVENOUS | Status: AC
  Administered 2022-06-12: 150 mg via INTRAVENOUS
  Filled 2022-06-12: qty 150

## 2022-06-12 MED ORDER — SODIUM CHLORIDE 0.9 % IV SOLN
2400.0000 mg/m2 | INTRAVENOUS | Status: DC
  Administered 2022-06-12: 4550 mg via INTRAVENOUS
  Filled 2022-06-12: qty 91

## 2022-06-12 MED ORDER — SODIUM CHLORIDE 0.9% FLUSH: 10.0000 mL | INTRAVENOUS | Status: DC | PRN

## 2022-06-12 MED ORDER — PALONOSETRON HCL INJECTION 0.25 MG/5ML
0.2500 mg | Freq: Once | INTRAVENOUS | Status: AC
  Administered 2022-06-12: 0.25 mg via INTRAVENOUS
  Filled 2022-06-12: qty 5

## 2022-06-12 NOTE — Progress Notes (Signed)
Pt  states that she has been having recent constipation and took three Dulcolax tablets yesterday with an "explosive stool" last night.  Pt requesting to hold off on Atropine today.  Pt understands if diarrhea does start once she returns home today, that Atropine can be given at time of pump d/c.

## 2022-06-12 NOTE — Progress Notes (Addendum)
Hematology and Oncology Follow Up Visit  Rebecca Bullock [id number] [birth year] 57 y.o. 06/12/2022   Principle Diagnosis:  Metastatic colorectal cancer-liver metastasis- BRAF (+)   Current Therapy:        Status post cycle 1 of chemotherapy with FOLFOXIRI/Avastin Encorafenib/Vectibix -- started on 06/19/2021, s/p cycle #6 -- d/c on 10/31/2021 Intrahepatic TACE -- 03/29/2022 FOLFOXIRI/Avastin-- s/p cycle  #5-- start on 02/19/2022, -oxaliplatin dropped on 04/03/2022 IV iron-Ferrlecit given on 05/10/2022                Interim History:  Rebecca Bullock is here today for follow-up.  She feels okay.  She really has no specific complaints.  She has had no problems with cough or shortness of breath.  There has been no abdominal pain.  There has been no change in bowel or bladder habits.  We did go ahead and do a MRI of her liver.  This seemed to show some slight progression.  Again, I am not sure if this is truly tumor progression or if this might be just some swelling from the intrahepatic therapy that she had back in November.  On the MRI, she had the necrotic mass measuring 9.6 x 7.1 cm.  It seems to be enlargement of a enhancing component along the medial aspect of the mass measuring 6 x 2.7 cm.  There are no additional findings in the liver that would suggest progression.  She does have decreased in a perihepatic lymph node measures 1.6 cm.  Her last CEA was down to 8.5.    At this point, is hard to say whether or not we need to make a change in therapy.  I do still think she is in be a candidate for surgical resection.  We try to get her out to [facility] and they just did not feel that this could be resected surgically.  We have not yet gotten a biopsy of her mass.  We could certainly get a biopsy to see if there is any additional changes in her DNA.  I know that the tumor is all BRAF positive.  I still think that we should continue her on our current protocol.  I think that she is tolerated this pretty  well.  Her grandson is now 39 months old.  She is enjoying him.  Overall, I would have to say that her performance status for now is probably ECOG 1.  Medications:  Allergies as of 06/12/2022       Reactions   Augmentin [amoxicillin-pot Clavulanate] Diarrhea   Extreme diarrhea, abdominal pain.   Irinotecan Other (See Comments)   Flushing, tingling, visual disturbance   Metronidazole Diarrhea   Vancomycin Rash   ?red syndrome   Tape Rash        Medication List        Accurate as of June 12, 2022  8:55 AM. If you have any questions, ask your nurse or doctor.          Acetaminophen 325 MG Caps Take 650 mg by mouth every 8 (eight) hours as needed for moderate pain or headache.   betamethasone valerate 0.1 % cream Commonly known as: VALISONE Apply 1 application  topically See admin instructions. Apply 1 application topically every other day as needed for psoriasis flare   cyclobenzaprine 10 MG tablet Commonly known as: FLEXERIL Take 1 tablet (10 mg total) by mouth 3 (three) times daily as needed for muscle spasms.   diphenoxylate-atropine 2.5-0.025 MG tablet Commonly known as: LOMOTIL Take 2  tablets by mouth 4 (four) times daily as needed for diarrhea or loose stools.   estradiol 0.1 MG/GM vaginal cream Commonly known as: ESTRACE Place 1 Applicatorful vaginally daily as needed (dryness / irritation).   hydrocortisone 1 % lotion Apply 1 application topically 2 (two) times daily as needed for itching.   omeprazole 20 MG tablet Commonly known as: PRILOSEC OTC Take 20 mg by mouth daily as needed (acid reflux).   traMADol 50 MG tablet Commonly known as: ULTRAM Take 1 tablet (50 mg total) by mouth every 6 (six) hours as needed.        Allergies:  Allergies  Allergen Reactions   Augmentin [Amoxicillin-Pot Clavulanate] Diarrhea    Extreme diarrhea, abdominal pain.   Irinotecan Other (See Comments)    Flushing, tingling, visual disturbance   Metronidazole  Diarrhea   Vancomycin Rash    ?red syndrome   Tape Rash    Past Medical History, Surgical history, Social history, and Family History were reviewed and updated.  Review of Systems: Review of Systems  Constitutional: Negative.   HENT: Negative.    Eyes: Negative.   Respiratory: Negative.    Cardiovascular: Negative.   Gastrointestinal: Negative.        Constipation alternating with diarrhea.   Genitourinary: Negative.   Musculoskeletal: Negative.   Skin: Negative.   Neurological: Negative.   Endo/Heme/Allergies: Negative.   Psychiatric/Behavioral: Negative.       Physical Exam: Her vital signs show temperature of 97.8.  Pulse 92.  Blood pressure is 111/67.  Weight is 184 pounds.  Wt Readings from Last 3 Encounters:  06/12/22 184 lb (83.5 kg)  05/22/22 184 lb 1.9 oz (83.5 kg)  04/23/22 185 lb (83.9 kg)    Physical Exam Vitals reviewed.  HENT:     Head: Normocephalic and atraumatic.  Eyes:     Pupils: Pupils are equal, round, and reactive to light.  Cardiovascular:     Rate and Rhythm: Normal rate and regular rhythm.     Heart sounds: Normal heart sounds.  Pulmonary:     Effort: Pulmonary effort is normal.     Breath sounds: Normal breath sounds.  Abdominal:     General: Bowel sounds are normal.     Palpations: Abdomen is soft.  Musculoskeletal:        General: No tenderness or deformity. Normal range of motion.     Cervical back: Normal range of motion.  Lymphadenopathy:     Cervical: No cervical adenopathy.  Skin:    General: Skin is warm and dry.     Findings: No erythema or rash.  Neurological:     Mental Status: She is alert and oriented to person, place, and time.  Psychiatric:        Behavior: Behavior normal.        Thought Content: Thought content normal.        Judgment: Judgment normal.      Lab Results  Component Value Date   WBC 4.9 06/12/2022   HGB 12.9 06/12/2022   HCT 40.5 06/12/2022   MCV 94.0 06/12/2022   PLT 192 06/12/2022    Lab Results  Component Value Date   FERRITIN 127 05/08/2022   IRON 45 05/08/2022   TIBC 349 05/08/2022   UIBC 304 05/08/2022   IRONPCTSAT 13 05/08/2022   Lab Results  Component Value Date   RETICCTPCT 1.4 06/12/2022   RBC 4.31 06/12/2022   RBC 4.23 06/12/2022   No results found for: "KPAFRELGTCHN", "LAMBDASER", "KAPLAMBRATIO"  No results found for: "IGGSERUM", "IGA", "IGMSERUM" No results found for: "TOTALPROTELP", "ALBUMINELP", "A1GS", "A2GS", "BETS", "BETA2SER", "GAMS", "MSPIKE", "SPEI"   Chemistry      Component Value Date/Time   NA 140 05/22/2022 0914   K 3.8 05/22/2022 0914   CL 105 05/22/2022 0914   CO2 26 05/22/2022 0914   BUN 6 05/22/2022 0914   CREATININE 0.59 05/22/2022 0914   CREATININE 0.56 03/23/2021 0000      Component Value Date/Time   CALCIUM 9.1 05/22/2022 0914   ALKPHOS 141 (H) 05/22/2022 0914   AST 23 05/22/2022 0914   ALT 16 05/22/2022 0914   BILITOT 0.3 05/22/2022 0914       Impression and Plan: Rebecca Bullock is a very charming  57 yo caucasian female with metastatic colon cancer.  Again, her disease has been confined to her liver.    I think that we should continue her on the protocol.  I think she is tolerating this well.  Her CEA certainly has responded.  I think if the CEA is elevated, I think we do have to get a liver biopsy and see about doing molecular markers to see if there is any changes.  Again, her tumor is BRAF positive.  I know she has had Vectibix in the past.  She has had anti-BRAF therapy.  1 option for her might be Lonsurf.  We could always consider Lonsurf.  For right now, we should just keep her on the protocol.  She has a good performance status.  She seems to be tolerating this well.  I will plan to get her back to see [place] in another couple weeks.   [name], MD 1/16/20248:55 AM  ADDENDUM: I spoke with Interventional Radiology.  They took a look at the MRI.  They think that they might be able to do another Y-90  treatment into the liver where there might be some activity with her liver met.  I think this is a fantastic idea.  I am hopefully optimistic that they will be able to do a second procedure into the liver.  I think this would certainly be beneficial.  I called Rebecca Bullock.  I told her that we will cancel the biopsy for right now.  We will see if Interventional Radiology can do another intrahepatic treatment.  She understands this.  Lattie Haw, MD

## 2022-06-12 NOTE — Patient Instructions (Signed)
[facility]  Discharge Instructions: Thank you for choosing [facility] to provide your oncology and hematology care.   If you have a lab appointment with the [facility], please go directly to the [facility] and check in at the registration area.  Wear comfortable clothing and clothing appropriate for easy access to any Portacath or PICC line.   We strive to give you quality time with your provider. You may need to reschedule your appointment if you arrive late (15 or more minutes).  Arriving late affects you and other patients whose appointments are after yours.  Also, if you miss three or more appointments without notifying the office, you may be dismissed from the clinic at the provider's discretion.      For prescription refill requests, have your pharmacy contact our office and allow 72 hours for refills to be completed.    Today you received the following chemotherapy and/or immunotherapy agents MVASI, Leucovorin, Camptosar, 5FU.      To help prevent nausea and vomiting after your treatment, we encourage you to take your nausea medication as directed.  BELOW ARE SYMPTOMS THAT SHOULD BE REPORTED IMMEDIATELY: *FEVER GREATER THAN 100.4 F (38 C) OR HIGHER *CHILLS OR SWEATING *NAUSEA AND VOMITING THAT IS NOT CONTROLLED WITH YOUR NAUSEA MEDICATION *UNUSUAL SHORTNESS OF BREATH *UNUSUAL BRUISING OR BLEEDING *URINARY PROBLEMS (pain or burning when urinating, or frequent urination) *BOWEL PROBLEMS (unusual diarrhea, constipation, pain near the anus) TENDERNESS IN MOUTH AND THROAT WITH OR WITHOUT PRESENCE OF ULCERS (sore throat, sores in mouth, or a toothache) UNUSUAL RASH, SWELLING OR PAIN  UNUSUAL VAGINAL DISCHARGE OR ITCHING   Items with * indicate a potential emergency and should be followed up as soon as possible or go to the Emergency Department if any problems should occur.  Please show the CHEMOTHERAPY ALERT CARD or IMMUNOTHERAPY ALERT  CARD at check-in to the Emergency Department and triage nurse. Should you have questions after your visit or need to cancel or reschedule your appointment, please contact [facility]  [phone] and follow the prompts.  Office hours are 8:00 a.m. to 4:30 p.m. Monday - Friday. Please note that voicemails left after 4:00 p.m. may not be returned until the following business day.  We are closed weekends and major holidays. You have access to a nurse at all times for urgent questions. Please call the main number to the clinic [phone] and follow the prompts.  For any non-urgent questions, you may also contact your provider using MyChart. We now offer e-Visits for anyone 95 and older to request care online for non-urgent symptoms. For details visit [email or web address].   Also download the MyChart app! Go to the app store, search "MyChart", open the app, select [facility], and log in with your MyChart username and password.  Masks are optional in the cancer centers. If you would like for your care team to wear a mask while they are taking care of you, please let them know. You may have one support person who is at least 55 years old accompany you for your appointments.

## 2022-06-13 ENCOUNTER — Other Ambulatory Visit: Payer: Self-pay

## 2022-06-14 ENCOUNTER — Encounter: Payer: Self-pay | Admitting: Hematology & Oncology

## 2022-06-14 ENCOUNTER — Inpatient Hospital Stay: Payer: Medicaid Other

## 2022-06-14 ENCOUNTER — Encounter: Payer: Self-pay | Admitting: Family

## 2022-06-14 VITALS — BP 126/67 | HR 89 | Temp 97.7°F | Resp 18

## 2022-06-14 DIAGNOSIS — C189 Malignant neoplasm of colon, unspecified: Secondary | ICD-10-CM

## 2022-06-14 DIAGNOSIS — Z95828 Presence of other vascular implants and grafts: Secondary | ICD-10-CM

## 2022-06-14 DIAGNOSIS — Z5112 Encounter for antineoplastic immunotherapy: Secondary | ICD-10-CM | POA: Diagnosis not present

## 2022-06-14 MED ORDER — PEGFILGRASTIM-CBQV 6 MG/0.6ML ~~LOC~~ SOSY
6.0000 mg | PREFILLED_SYRINGE | Freq: Once | SUBCUTANEOUS | Status: AC
  Administered 2022-06-14: 6 mg via SUBCUTANEOUS
  Filled 2022-06-14: qty 0.6

## 2022-06-14 MED ORDER — HEPARIN SOD (PORK) LOCK FLUSH 100 UNIT/ML IV SOLN
500.0000 [IU] | Freq: Once | INTRAVENOUS | Status: AC
  Administered 2022-06-14: 500 [IU] via INTRAVENOUS

## 2022-06-14 MED ORDER — SODIUM CHLORIDE 0.9% FLUSH
10.0000 mL | Freq: Once | INTRAVENOUS | Status: AC
  Administered 2022-06-14: 10 mL via INTRAVENOUS

## 2022-06-14 NOTE — Progress Notes (Signed)
Patient's scan which showed progression per the radiologist may actually show treatment effect from the patient's intrahepatic therapy she received in November. Dr [name] will keep patient on the same regimen at this time.   Oncology Nurse Navigator Documentation     06/12/2022   10:30 AM  Oncology Nurse Navigator Flowsheets  Navigator Follow Up Date: 06/26/2022  Navigator Follow Up Reason: Follow-up Appointment;Chemotherapy  Navigator Location [facility]  Navigator Encounter Type Treatment;Appt/Treatment Plan Review  Patient Visit Type MedOnc  Treatment Phase Active Tx  Barriers/Navigation Needs No Barriers At This Time  Interventions Psycho-Social Support  Acuity Level 1-No Barriers  Support Groups/Services Friends and Family  Time Spent with Patient 15

## 2022-06-14 NOTE — Patient Instructions (Signed)

## 2022-06-15 ENCOUNTER — Other Ambulatory Visit: Payer: Self-pay

## 2022-06-15 ENCOUNTER — Other Ambulatory Visit: Payer: Self-pay | Admitting: Interventional Radiology

## 2022-06-15 DIAGNOSIS — C189 Malignant neoplasm of colon, unspecified: Secondary | ICD-10-CM

## 2022-06-15 NOTE — Progress Notes (Signed)
Rebecca Edouard, MD  Donita Brooks D Cancel biopsy request. I spoke w/ Dr. [name].  GY

## 2022-06-17 ENCOUNTER — Other Ambulatory Visit: Payer: Self-pay

## 2022-06-22 ENCOUNTER — Other Ambulatory Visit: Payer: Self-pay

## 2022-06-25 ENCOUNTER — Ambulatory Visit
Admission: RE | Admit: 2022-06-25 | Discharge: 2022-06-25 | Disposition: A | Discharge status: Home / Self Care | Payer: Medicaid Other | Source: Ambulatory Visit | Attending: Interventional Radiology | Admitting: Interventional Radiology

## 2022-06-25 DIAGNOSIS — C189 Malignant neoplasm of colon, unspecified: Secondary | ICD-10-CM

## 2022-06-25 HISTORY — PX: IR RADIOLOGIST EVAL & MGMT: IMG5224

## 2022-06-25 NOTE — Progress Notes (Signed)
Referring Physician(s): Burney Gauze, MD   Reason for follow up:  10 weeks s/p TARE, virtual telephone visit   History of present illness: 57 year old female with history of BRAF+ metastatic ascending colonic mass with metastasis to the liver.  She is status post FOLFOXIRI/Avastin, most recently just [facility] (s/p 10 cycles total since January 2023).   She is now status post right superior lobar transhepatic radioembolization on 03/29/22.  The procedure went as planned without complication.  She has recovered very well from radioembolization.  She denies fevers, chills, shortness of breath, abdominal pain, nausea, vomiting, jaundice.  She followed up with Dr. [name] who ordered MRI abdomen which was performed on 06/08/22.  Unfortunately, this was too early after the radioembolization treatment period to have reliable specificity for treatment response versus persistent viable mass.    She reports feeling well except for recent GI bug.  No changes in symptoms since last visit.  Past Medical History:  Diagnosis Date   Colon cancer metastasized to liver ([place]) 05/05/2021   Family history of lung cancer    Family history of pancreatic cancer    Goals of care, counseling/discussion 05/05/2021    Past Surgical History:  Procedure Laterality Date   IR 3D INDEPENDENT WKST  03/15/2022   IR ANGIOGRAM SELECTIVE EACH ADDITIONAL VESSEL  03/15/2022   IR ANGIOGRAM SELECTIVE EACH ADDITIONAL VESSEL  03/15/2022   IR ANGIOGRAM SELECTIVE EACH ADDITIONAL VESSEL  03/15/2022   IR ANGIOGRAM SELECTIVE EACH ADDITIONAL VESSEL  03/29/2022   IR ANGIOGRAM SELECTIVE EACH ADDITIONAL VESSEL  03/29/2022   IR ANGIOGRAM VISCERAL SELECTIVE  03/15/2022   IR ANGIOGRAM VISCERAL SELECTIVE  03/29/2022   IR EMBO ARTERIAL NOT HEMORR HEMANG INC GUIDE ROADMAPPING  03/15/2022   IR EMBO TUMOR ORGAN ISCHEMIA INFARCT INC GUIDE ROADMAPPING  03/29/2022   IR IMAGING GUIDED PORT INSERTION  05/11/2021   IR RADIOLOGIST EVAL & MGMT  02/15/2022    IR RADIOLOGIST EVAL & MGMT  04/23/2022   IR US GUIDE VASC ACCESS RIGHT  03/29/2022   uterine ablation      Allergies: Augmentin [amoxicillin-pot clavulanate], Irinotecan, Metronidazole, Vancomycin, and Tape  Medications: Prior to Admission medications   Medication Sig Start Date End Date Taking? Authorizing Provider  Acetaminophen 325 MG CAPS Take 650 mg by mouth every 8 (eight) hours as needed for moderate pain or headache.    [provider]  betamethasone valerate (VALISONE) 0.1 % cream Apply 1 application  topically See admin instructions. Apply 1 application topically every other day as needed for psoriasis flare 11/11/20   [provider]  cyclobenzaprine (FLEXERIL) 10 MG tablet Take 1 tablet (10 mg total) by mouth 3 (three) times daily as needed for muscle spasms. 02/19/22   [name], MD  diphenoxylate-atropine (LOMOTIL) 2.5-0.025 MG tablet Take 2 tablets by mouth 4 (four) times daily as needed for diarrhea or loose stools. 11/22/21   [name], MD  estradiol (ESTRACE) 0.1 MG/GM vaginal cream Place 1 Applicatorful vaginally daily as needed (dryness / irritation). 11/11/20   [provider]  hydrocortisone 1 % lotion Apply 1 application topically 2 (two) times daily as needed for itching. Patient not taking: Reported on 05/22/2022 06/26/21   [name], MD  omeprazole (PRILOSEC OTC) 20 MG tablet Take 20 mg by mouth daily as needed (acid reflux).    [provider]  traMADol (ULTRAM) 50 MG tablet Take 1 tablet (50 mg total) by mouth every 6 (six) hours as needed. 02/19/22  [name], MD     Family History  Problem Relation Age of Onset   Lung cancer Mother    Diabetes Maternal Grandmother    Pancreatic cancer Maternal Grandfather    Multiple sclerosis Maternal Aunt    Alcohol abuse Maternal Uncle     Social History   Socioeconomic History   Marital status: Single    Spouse name: Not on file   Number of children: Not  on file   Years of education: Not on file   Highest education level: Not on file  Occupational History   Not on file  Tobacco Use   Smoking status: Never   Smokeless tobacco: Never  Vaping Use   Vaping Use: Never used  Substance and Sexual Activity   Alcohol use: Yes    Comment: occasionally   Drug use: Not Currently   Sexual activity: Not Currently  Other Topics Concern   Not on file  Social History Narrative   Not on file   Social Determinants of Health   Financial Resource Strain: Medium Risk (05/18/2021)   Overall Financial Resource Strain (CARDIA)    Difficulty of Paying Living Expenses: Somewhat hard  Food Insecurity: No Food Insecurity (05/18/2021)   Hunger Vital Sign    Worried About Running Out of Food in the Last Year: Never true    Ran Out of Food in the Last Year: Never true  Transportation Needs: No Transportation Needs (05/18/2021)   PRAPARE - Hydrologist (Medical): No    Lack of Transportation (Non-Medical): No  Physical Activity: Not on file  Stress: Not on file  Social Connections: Not on file     Vital Signs: There were no vitals taken for this visit.  No physical examination was performed in lieu of virtual telephone clinic visit.  Imaging: MRI abdomen 12/15/21   MRI abdomen 06/08/22   Labs:  CBC: Recent Labs    04/23/22 0942 05/08/22 0936 05/22/22 0914 06/12/22 0835  WBC 4.2 3.6* 2.9* 4.9  HGB 11.6* 11.5* 11.9* 12.9  HCT 37.0 36.0 36.9 40.5  PLT 173 147* 128* 192    COAGS: Recent Labs    03/15/22 0801 03/29/22 0820  INR 1.0 1.0    BMP: Recent Labs    04/23/22 0942 05/08/22 0936 05/22/22 0914 06/12/22 0835  NA 141 140 140 141  K 3.6 3.7 3.8 3.5  CL 104 105 105 102  CO2 '28 27 26 28  '$ GLUCOSE 136* 104* 105* 125*  BUN '6 9 6 8  '$ CALCIUM 9.4 9.1 9.1 9.9  CREATININE 0.54 0.51 0.59 0.60  GFRNONAA >60 >60 >60 >60    LIVER FUNCTION TESTS: Recent Labs    04/23/22 0942 05/08/22 0936  05/22/22 0914 06/12/22 0835  BILITOT 0.3 0.3 0.3 0.3  AST '19 23 23 24  '$ ALT '12 16 16 16  '$ ALKPHOS 143* 135* 141* 153*  PROT 6.8 6.8 6.6 7.4  ALBUMIN 4.2 4.0 4.0 4.2   CEA  11.06 (02/15/22) --> 10.57 (06/12/22)  Assessment and Plan: 57 year old female with metastatic colon cancer to the liver status post right superior lobar transhepatic radioembolization on 03/29/22.  She has tolerated this very well and remains asymptomatic.  MRI abdomen performed only 2 months after treatment demonstrates similar appearing mostly necrotic right hepatic mass with mild peripheral enhancement, likely representing persistent immediate post-treatment changes.   -plan for MRI abdomen in 3 months - our office will order -follow up in IR clinic after  MRI     Electronically Signed: Suzette Battiest 06/25/2022, 10:03 AM   I spent a total of 15 Minutes in virtual telephone clinical consultation, greater than 50% of which was counseling/coordinating care for colon cancer metastatic to the liver.

## 2022-06-26 ENCOUNTER — Encounter: Payer: Self-pay | Admitting: Hematology & Oncology

## 2022-06-26 ENCOUNTER — Other Ambulatory Visit: Payer: Self-pay

## 2022-06-26 ENCOUNTER — Inpatient Hospital Stay: Payer: Medicaid Other

## 2022-06-26 ENCOUNTER — Inpatient Hospital Stay (HOSPITAL_BASED_OUTPATIENT_CLINIC_OR_DEPARTMENT_OTHER): Payer: Medicaid Other | Admitting: Hematology & Oncology

## 2022-06-26 ENCOUNTER — Encounter: Payer: Self-pay | Admitting: *Deleted

## 2022-06-26 VITALS — BP 121/93 | HR 118 | Temp 97.7°F | Resp 18 | Ht 61.0 in | Wt 181.0 lb

## 2022-06-26 VITALS — BP 113/86 | HR 94 | Resp 17

## 2022-06-26 DIAGNOSIS — Z95828 Presence of other vascular implants and grafts: Secondary | ICD-10-CM

## 2022-06-26 DIAGNOSIS — C787 Secondary malignant neoplasm of liver and intrahepatic bile duct: Secondary | ICD-10-CM | POA: Diagnosis not present

## 2022-06-26 DIAGNOSIS — C189 Malignant neoplasm of colon, unspecified: Secondary | ICD-10-CM | POA: Diagnosis not present

## 2022-06-26 DIAGNOSIS — Z5112 Encounter for antineoplastic immunotherapy: Secondary | ICD-10-CM | POA: Diagnosis not present

## 2022-06-26 LAB — CBC WITH DIFFERENTIAL (CANCER CENTER ONLY)
Abs Immature Granulocytes: 0.04 10*3/uL (ref 0.00–0.07)
Basophils Absolute: 0 10*3/uL (ref 0.0–0.1)
Basophils Relative: 0 %
Eosinophils Absolute: 0 10*3/uL (ref 0.0–0.5)
Eosinophils Relative: 1 %
HCT: 44.1 % (ref 36.0–46.0)
Hemoglobin: 14.2 g/dL (ref 12.0–15.0)
Immature Granulocytes: 1 %
Lymphocytes Relative: 12 %
Lymphs Abs: 0.9 10*3/uL (ref 0.7–4.0)
MCH: 30.5 pg (ref 26.0–34.0)
MCHC: 32.2 g/dL (ref 30.0–36.0)
MCV: 94.6 fL (ref 80.0–100.0)
Monocytes Absolute: 0.4 10*3/uL (ref 0.1–1.0)
Monocytes Relative: 6 %
Neutro Abs: 6.1 10*3/uL (ref 1.7–7.7)
Neutrophils Relative %: 80 %
Platelet Count: 146 10*3/uL — ABNORMAL LOW (ref 150–400)
RBC: 4.66 MIL/uL (ref 3.87–5.11)
RDW: 16.4 % — ABNORMAL HIGH (ref 11.5–15.5)
WBC Count: 7.5 10*3/uL (ref 4.0–10.5)
nRBC: 0 % (ref 0.0–0.2)

## 2022-06-26 LAB — CMP (CANCER CENTER ONLY)
ALT: 17 U/L (ref 0–44)
AST: 20 U/L (ref 15–41)
Albumin: 4.3 g/dL (ref 3.5–5.0)
Alkaline Phosphatase: 170 U/L — ABNORMAL HIGH (ref 38–126)
Anion gap: 11 (ref 5–15)
BUN: 14 mg/dL (ref 6–20)
CO2: 25 mmol/L (ref 22–32)
Calcium: 9.5 mg/dL (ref 8.9–10.3)
Chloride: 102 mmol/L (ref 98–111)
Creatinine: 0.84 mg/dL (ref 0.44–1.00)
GFR, Estimated: >60 mL/min (ref >60)
Glucose, Bld: 127 mg/dL — ABNORMAL HIGH (ref 70–99)
Potassium: 3.8 mmol/L (ref 3.5–5.1)
Sodium: 138 mmol/L (ref 135–145)
Total Bilirubin: 0.5 mg/dL (ref 0.3–1.2)
Total Protein: 7.2 g/dL (ref 6.5–8.1)

## 2022-06-26 MED ORDER — SODIUM CHLORIDE 0.9% FLUSH
10.0000 mL | Freq: Once | INTRAVENOUS | Status: AC
  Administered 2022-06-26: 10 mL

## 2022-06-26 MED ORDER — SODIUM CHLORIDE 0.9% FLUSH
10.0000 mL | INTRAVENOUS | Status: DC | PRN
  Administered 2022-06-26: 10 mL via INTRAVENOUS

## 2022-06-26 MED ORDER — HEPARIN SOD (PORK) LOCK FLUSH 100 UNIT/ML IV SOLN
500.0000 [IU] | Freq: Once | INTRAVENOUS | Status: AC
  Administered 2022-06-26: 500 [IU] via INTRAVENOUS

## 2022-06-26 MED ORDER — SODIUM CHLORIDE 0.9 % IV SOLN: INTRAVENOUS | Status: AC

## 2022-06-26 MED ORDER — SODIUM CHLORIDE 0.9 % IV SOLN: INTRAVENOUS | Status: DC

## 2022-06-26 NOTE — Progress Notes (Signed)
Hematology and Oncology Follow Up Visit  Rebecca Bullock [id number] 1966-03-14 57 y.o. 06/26/2022   Principle Diagnosis:  Metastatic colorectal cancer-liver metastasis- BRAF (+)   Current Therapy:        Status post cycle 1 of chemotherapy with FOLFOXIRI/Avastin Encorafenib/Vectibix -- started on 06/19/2021, s/p cycle #6 -- d/c on 10/31/2021 Intrahepatic TACE -- 03/29/2022 FOLFOXIRI/Avastin-- s/p cycle  #5-- start on 02/19/2022, -oxaliplatin dropped on 04/03/2022 IV iron-Ferrlecit given on 05/10/2022                Interim History:  Rebecca Bullock is here today for follow-up.  She has been having some abdominal issues.  She did have this abdominal pain.  This is epigastric in nature.  She says that it goes down to her umbilicus.  She has had some vomiting and diarrhea starting Sunday.  She took some antidiarrheal medication.  This is seem to help.  She does not think there is anything that she ate.  She did see Interventional Radiology recently.  This was a telephone visit.  They reviewed the MRI of her liver.  They felt that everything looked fine and that the appearance was consistent with the radiotherapy that she had.  Her last CEA level was 10.5.  She has had no obvious bleeding.  There is been no urinary difficulty.  We have given her iron in the past.  Her last iron studies back on 06/12/2022 showed a ferritin of 216 with an iron saturation of 20%.  Her grandson is doing okay.  I think is about 36 months old now.  She overall, has a performance status of ECOG 1.     Medications:  Allergies as of 06/26/2022       Reactions   Augmentin [amoxicillin-pot Clavulanate] Diarrhea   Extreme diarrhea, abdominal pain.   Irinotecan Other (See Comments)   Flushing, tingling, visual disturbance   Metronidazole Diarrhea   Vancomycin Rash   ?red syndrome   Tape Rash        Medication List        Accurate as of June 26, 2022  8:15 AM. If you have any questions, ask your nurse or  doctor.          Acetaminophen 325 MG Caps Take 650 mg by mouth every 8 (eight) hours as needed for moderate pain or headache.   betamethasone valerate 0.1 % cream Commonly known as: VALISONE Apply 1 application  topically See admin instructions. Apply 1 application topically every other day as needed for psoriasis flare   cyclobenzaprine 10 MG tablet Commonly known as: FLEXERIL Take 1 tablet (10 mg total) by mouth 3 (three) times daily as needed for muscle spasms.   diphenoxylate-atropine 2.5-0.025 MG tablet Commonly known as: LOMOTIL Take 2 tablets by mouth 4 (four) times daily as needed for diarrhea or loose stools.   estradiol 0.1 MG/GM vaginal cream Commonly known as: ESTRACE Place 1 Applicatorful vaginally daily as needed (dryness / irritation).   hydrocortisone 1 % lotion Apply 1 application topically 2 (two) times daily as needed for itching.   omeprazole 20 MG tablet Commonly known as: PRILOSEC OTC Take 20 mg by mouth daily as needed (acid reflux).   traMADol 50 MG tablet Commonly known as: ULTRAM Take 1 tablet (50 mg total) by mouth every 6 (six) hours as needed.        Allergies:  Allergies  Allergen Reactions   Augmentin [Amoxicillin-Pot Clavulanate] Diarrhea    Extreme diarrhea, abdominal pain.   Irinotecan Other (  See Comments)    Flushing, tingling, visual disturbance   Metronidazole Diarrhea   Vancomycin Rash    ?red syndrome   Tape Rash    Past Medical History, Surgical history, Social history, and Family History were reviewed and updated.  Review of Systems: Review of Systems  Constitutional: Negative.   HENT: Negative.    Eyes: Negative.   Respiratory: Negative.    Cardiovascular: Negative.   Gastrointestinal: Negative.        Constipation alternating with diarrhea.   Genitourinary: Negative.   Musculoskeletal: Negative.   Skin: Negative.   Neurological: Negative.   Endo/Heme/Allergies: Negative.   Psychiatric/Behavioral:  Negative.       Physical Exam: Her vital signs show temperature of 97.8.  Pulse 92.  Blood pressure is 111/67.  Weight is 184 pounds.  Wt Readings from Last 3 Encounters:  06/12/22 184 lb (83.5 kg)  05/22/22 184 lb 1.9 oz (83.5 kg)  04/23/22 185 lb (83.9 kg)    Physical Exam Vitals reviewed.  HENT:     Head: Normocephalic and atraumatic.  Eyes:     Pupils: Pupils are equal, round, and reactive to light.  Cardiovascular:     Rate and Rhythm: Normal rate and regular rhythm.     Heart sounds: Normal heart sounds.  Pulmonary:     Effort: Pulmonary effort is normal.     Breath sounds: Normal breath sounds.  Abdominal:     General: Bowel sounds are normal.     Palpations: Abdomen is soft.  Musculoskeletal:        General: No tenderness or deformity. Normal range of motion.     Cervical back: Normal range of motion.  Lymphadenopathy:     Cervical: No cervical adenopathy.  Skin:    General: Skin is warm and dry.     Findings: No erythema or rash.  Neurological:     Mental Status: She is alert and oriented to person, place, and time.  Psychiatric:        Behavior: Behavior normal.        Thought Content: Thought content normal.        Judgment: Judgment normal.     Lab Results  Component Value Date   WBC 4.9 06/12/2022   HGB 12.9 06/12/2022   HCT 40.5 06/12/2022   MCV 94.0 06/12/2022   PLT 192 06/12/2022   Lab Results  Component Value Date   FERRITIN 216 06/12/2022   IRON 73 06/12/2022   TIBC 361 06/12/2022   UIBC 288 06/12/2022   IRONPCTSAT 20 06/12/2022   Lab Results  Component Value Date   RETICCTPCT 1.4 06/12/2022   RBC 4.31 06/12/2022   RBC 4.23 06/12/2022   No results found for: "KPAFRELGTCHN", "LAMBDASER", "KAPLAMBRATIO" No results found for: "IGGSERUM", "IGA", "IGMSERUM" No results found for: "TOTALPROTELP", "ALBUMINELP", "A1GS", "A2GS", "BETS", "BETA2SER", "GAMS", "MSPIKE", "SPEI"   Chemistry      Component Value Date/Time   NA 141 06/12/2022  0835   K 3.5 06/12/2022 0835   CL 102 06/12/2022 0835   CO2 28 06/12/2022 0835   BUN 8 06/12/2022 0835   CREATININE 0.60 06/12/2022 0835   CREATININE 0.56 03/23/2021 0000      Component Value Date/Time   CALCIUM 9.9 06/12/2022 0835   ALKPHOS 153 (H) 06/12/2022 0835   AST 24 06/12/2022 0835   ALT 16 06/12/2022 0835   BILITOT 0.3 06/12/2022 0835       Impression and Plan: Ms. Meinhardt is a very charming  57  yo caucasian female with metastatic colon cancer.  Again, her disease has been confined to her liver.  The primary is in the ascending colon.  We will not treated today.  I do still think that I feel confident in treating her today.  I am not sure what to make of this abdominal pain.  I told her to take an antiacid on a regular schedule.  We will have her come back in 1 week and see how she is doing.  If the CEA level continues to elevate, then I think our next approach would have to be doing a biopsy so we can get some molecular markers.  I hate to have to be invasive but I think that this would have to be the best way for [place] to try to help her out.   [name], MD 1/30/20248:15 AM

## 2022-06-26 NOTE — Progress Notes (Signed)
Patient here for her next cycle, however over the weekend had a GI bug and still isn't feeling well. She will get fluids and symptom management today.   Oncology Nurse Navigator Documentation     06/26/2022    8:00 AM  Oncology Nurse Navigator Flowsheets  Navigator Follow Up Date: 07/03/2022  Navigator Follow Up Reason: Follow-up Appointment;Chemotherapy  Navigator Location [facility]  Navigator Encounter Type Appt/Treatment Plan Review  Patient Visit Type MedOnc  Treatment Phase Active Tx  Barriers/Navigation Needs No Barriers At This Time  Interventions Psycho-Social Support  Acuity Level 1-No Barriers  Support Groups/Services Friends and Family  Time Spent with Patient 15

## 2022-06-26 NOTE — Patient Instructions (Signed)
Dehydration, Adult Dehydration is a condition in which there is not enough water or other fluids in the body. This happens when a person loses more fluids than he or she takes in. Important organs, such as the kidneys, brain, and heart, cannot function without a proper amount of fluids. Any loss of fluids from the body can lead to dehydration. Dehydration can be mild, moderate, or severe. It should be treated right away to prevent it from becoming severe. What are the causes? Dehydration may be caused by: Conditions that cause loss of water or other fluids, such as diarrhea, vomiting, or sweating or urinating a lot. Not drinking enough fluids, especially when you are ill or doing activities that require a lot of energy. Other illnesses and conditions, such as fever or infection. Certain medicines, such as medicines that remove excess fluid from the body (diuretics). Lack of safe drinking water. Not being able to get enough water and food. What increases the risk? The following factors may make you more likely to develop this condition: Having a long-term (chronic) illness that has not been treated properly, such as diabetes, heart disease, or kidney disease. Being 65 years of age or older. Having a disability. Living in a place that is high in altitude, where thinner, drier air causes more fluid loss. Doing exercises that put stress on your body for a long time (endurance sports). What are the signs or symptoms? Symptoms of dehydration depend on how severe it is. Mild or moderate dehydration Thirst. Dry lips or dry mouth. Dizziness or light-headedness, especially when standing up from a seated position. Muscle cramps. Dark urine. Urine may be the color of tea. Less urine or tears produced than usual. Headache. Severe dehydration Changes in skin. Your skin may be cold and clammy, blotchy, or pale. Your skin also may not return to normal after being lightly pinched and released. Little or  no tears, urine, or sweat. Changes in vital signs, such as rapid breathing and low blood pressure. Your pulse may be weak or may be faster than 100 beats a minute when you are sitting still. Other changes, such as: Feeling very thirsty. Sunken eyes. Cold hands and feet. Confusion. Being very tired (lethargic) or having trouble waking from sleep. Short-term weight loss. Loss of consciousness. How is this diagnosed? This condition is diagnosed based on your symptoms and a physical exam. You may have blood and urine tests to help confirm the diagnosis. How is this treated? Treatment for this condition depends on how severe it is. Treatment should be started right away. Do not wait until dehydration becomes severe. Severe dehydration is an emergency and needs to be treated in a hospital. Mild or moderate dehydration can be treated at home. You may be asked to: Drink more fluids. Drink an oral rehydration solution (ORS). This drink helps restore proper amounts of fluids and salts and minerals in the blood (electrolytes). Severe dehydration can be treated: With IV fluids. By correcting abnormal levels of electrolytes. This is often done by giving electrolytes through a tube that is passed through your nose and into your stomach (nasogastric tube, or NG tube). By treating the underlying cause of dehydration. Follow these instructions at home: Oral rehydration solution If told by your health care provider, drink an ORS: Make an ORS by following instructions on the package. Start by drinking small amounts, about  cup (120 mL) every 5-10 minutes. Slowly increase how much you drink until you have taken the amount recommended by your health   care provider. Eating and drinking        Drink enough clear fluid to keep your urine pale yellow. If you were told to drink an ORS, finish the ORS first and then start slowly drinking other clear fluids. Drink fluids such as: Water. Do not drink only  water. Doing that can lead to hyponatremia, which is having too little salt (sodium) in the body. Water from ice chips you suck on. Fruit juice that you have added water to (diluted fruit juice). Low-calorie sports drinks. Eat foods that contain a healthy balance of electrolytes, such as bananas, oranges, potatoes, tomatoes, and spinach. Do not drink alcohol. Avoid the following: Drinks that contain a lot of sugar. These include high-calorie sports drinks, fruit juice that is not diluted, and soda. Caffeine. Foods that are greasy or contain a lot of fat or sugar. General instructions Take over-the-counter and prescription medicines only as told by your health care provider. Do not take sodium tablets. Doing that can lead to having too much sodium in the body (hypernatremia). Return to your normal activities as told by your health care provider. Ask your health care provider what activities are safe for you. Keep all follow-up visits as told by your health care provider. This is important. Contact a health care provider if: You have muscle cramps, pain, or discomfort, such as: Pain in your abdomen and the pain gets worse or stays in one area (localizes). Stiff neck. You have a rash. You are more irritable than usual. You are sleepier or have a harder time waking than usual. You feel weak or dizzy. You feel very thirsty. Get help right away if you have: Any symptoms of severe dehydration. Symptoms of vomiting, such as: You cannot eat or drink without vomiting. Vomiting gets worse or does not go away. Vomit includes blood or green matter (bile). Symptoms that get worse with treatment. A fever. A severe headache. Problems with urination or bowel movements, such as: Diarrhea that gets worse or does not go away. Blood in your stool (feces). This may cause stool to look black and tarry. Not urinating, or urinating only a small amount of very dark urine, within 6-8 hours. Trouble  breathing. These symptoms may represent a serious problem that is an emergency. Do not wait to see if the symptoms will go away. Get medical help right away. Call your local emergency services (911 in the [place] Do not drive yourself to the hospital. Summary Dehydration is a condition in which there is not enough water or other fluids in the body. This happens when a person loses more fluids than he or she takes in. Treatment for this condition depends on how severe it is. Treatment should be started right away. Do not wait until dehydration becomes severe. Drink enough clear fluid to keep your urine pale yellow. If you were told to drink an oral rehydration solution (ORS), finish the ORS first and then start slowly drinking other clear fluids. Take over-the-counter and prescription medicines only as told by your health care provider. Get help right away if you have any symptoms of severe dehydration. This information is not intended to replace advice given to you by your health care provider. Make sure you discuss any questions you have with your health care provider. Document Revised: 09/20/2021 Document Reviewed: 12/25/2018 Elsevier Patient Education  2023 Elsevier Inc.  

## 2022-06-26 NOTE — Progress Notes (Signed)
Pt to receive one liter of IVF's and no treatment today per order of Dr. [name].

## 2022-06-27 ENCOUNTER — Encounter: Payer: Self-pay | Admitting: Hematology & Oncology

## 2022-06-27 ENCOUNTER — Other Ambulatory Visit: Payer: Self-pay

## 2022-06-27 ENCOUNTER — Other Ambulatory Visit (HOSPITAL_BASED_OUTPATIENT_CLINIC_OR_DEPARTMENT_OTHER): Payer: Self-pay

## 2022-06-27 ENCOUNTER — Encounter: Payer: Self-pay | Admitting: Family

## 2022-06-27 ENCOUNTER — Other Ambulatory Visit: Payer: Self-pay | Admitting: Hematology & Oncology

## 2022-06-27 DIAGNOSIS — C189 Malignant neoplasm of colon, unspecified: Secondary | ICD-10-CM

## 2022-06-27 MED ORDER — FAMOTIDINE 40 MG PO TABS
40.0000 mg | ORAL_TABLET | Freq: Two times a day (BID) | ORAL | 4 refills | Status: DC
  Filled 2022-06-27: qty 60, 30d supply, fill #0

## 2022-06-27 MED ORDER — OLANZAPINE 10 MG PO TABS
10.0000 mg | ORAL_TABLET | Freq: Every day | ORAL | 3 refills | Status: DC
  Filled 2022-06-27: qty 30, 30d supply, fill #0

## 2022-06-28 ENCOUNTER — Encounter: Payer: Self-pay | Admitting: Family

## 2022-06-28 ENCOUNTER — Other Ambulatory Visit (HOSPITAL_BASED_OUTPATIENT_CLINIC_OR_DEPARTMENT_OTHER): Payer: Self-pay

## 2022-06-28 ENCOUNTER — Encounter: Payer: Self-pay | Admitting: Hematology & Oncology

## 2022-06-28 ENCOUNTER — Encounter: Payer: Self-pay | Admitting: *Deleted

## 2022-06-28 ENCOUNTER — Ambulatory Visit (HOSPITAL_BASED_OUTPATIENT_CLINIC_OR_DEPARTMENT_OTHER)
Admission: RE | Admit: 2022-06-28 | Discharge: 2022-06-28 | Disposition: A | Discharge status: Home / Self Care | Payer: Medicaid Other | Source: Ambulatory Visit | Attending: Hematology & Oncology | Admitting: Hematology & Oncology

## 2022-06-28 DIAGNOSIS — C787 Secondary malignant neoplasm of liver and intrahepatic bile duct: Secondary | ICD-10-CM | POA: Insufficient documentation

## 2022-06-28 DIAGNOSIS — C189 Malignant neoplasm of colon, unspecified: Secondary | ICD-10-CM

## 2022-06-28 MED ORDER — IOHEXOL 300 MG/ML  SOLN
100.0000 mL | Freq: Once | INTRAMUSCULAR | Status: AC | PRN
  Administered 2022-06-28: 100 mL via INTRAVENOUS

## 2022-06-28 NOTE — Progress Notes (Signed)
Approval received from [place] for Olanzapine 10 mg tab from 06/28/22-06/29/23.

## 2022-06-29 ENCOUNTER — Other Ambulatory Visit: Payer: Self-pay | Admitting: Hematology & Oncology

## 2022-06-29 DIAGNOSIS — C787 Secondary malignant neoplasm of liver and intrahepatic bile duct: Secondary | ICD-10-CM

## 2022-07-01 ENCOUNTER — Other Ambulatory Visit: Payer: Self-pay

## 2022-07-02 ENCOUNTER — Ambulatory Visit (HOSPITAL_COMMUNITY)
Admission: RE | Admit: 2022-07-02 | Discharge: 2022-07-02 | Disposition: A | Discharge status: Home / Self Care | Payer: Medicaid Other | Source: Ambulatory Visit | Attending: Hematology & Oncology | Admitting: Hematology & Oncology

## 2022-07-02 DIAGNOSIS — C787 Secondary malignant neoplasm of liver and intrahepatic bile duct: Secondary | ICD-10-CM | POA: Diagnosis present

## 2022-07-02 DIAGNOSIS — C189 Malignant neoplasm of colon, unspecified: Secondary | ICD-10-CM | POA: Diagnosis present

## 2022-07-02 MED ORDER — GADOBUTROL 1 MMOL/ML IV SOLN
8.0000 mL | Freq: Once | INTRAVENOUS | Status: AC | PRN
  Administered 2022-07-02: 8 mL via INTRAVENOUS

## 2022-07-03 ENCOUNTER — Inpatient Hospital Stay: Payer: Medicaid Other

## 2022-07-03 ENCOUNTER — Telehealth: Payer: Self-pay

## 2022-07-03 ENCOUNTER — Other Ambulatory Visit: Payer: Self-pay

## 2022-07-03 ENCOUNTER — Encounter: Payer: Self-pay | Admitting: *Deleted

## 2022-07-03 ENCOUNTER — Encounter: Payer: Self-pay | Admitting: Family

## 2022-07-03 ENCOUNTER — Other Ambulatory Visit (HOSPITAL_COMMUNITY): Payer: Self-pay

## 2022-07-03 ENCOUNTER — Other Ambulatory Visit (HOSPITAL_BASED_OUTPATIENT_CLINIC_OR_DEPARTMENT_OTHER): Payer: Self-pay

## 2022-07-03 ENCOUNTER — Inpatient Hospital Stay (HOSPITAL_BASED_OUTPATIENT_CLINIC_OR_DEPARTMENT_OTHER): Payer: Medicaid Other | Admitting: Hematology & Oncology

## 2022-07-03 ENCOUNTER — Encounter: Payer: Self-pay | Admitting: Hematology & Oncology

## 2022-07-03 VITALS — BP 138/79 | HR 89 | Resp 18 | Ht 61.0 in | Wt 190.0 lb

## 2022-07-03 DIAGNOSIS — N920 Excessive and frequent menstruation with regular cycle: Secondary | ICD-10-CM

## 2022-07-03 DIAGNOSIS — R0602 Shortness of breath: Secondary | ICD-10-CM

## 2022-07-03 DIAGNOSIS — R110 Nausea: Secondary | ICD-10-CM | POA: Insufficient documentation

## 2022-07-03 DIAGNOSIS — C19 Malignant neoplasm of rectosigmoid junction: Secondary | ICD-10-CM | POA: Insufficient documentation

## 2022-07-03 DIAGNOSIS — J302 Other seasonal allergic rhinitis: Secondary | ICD-10-CM

## 2022-07-03 DIAGNOSIS — C182 Malignant neoplasm of ascending colon: Secondary | ICD-10-CM | POA: Insufficient documentation

## 2022-07-03 DIAGNOSIS — C7951 Secondary malignant neoplasm of bone: Secondary | ICD-10-CM | POA: Insufficient documentation

## 2022-07-03 DIAGNOSIS — N939 Abnormal uterine and vaginal bleeding, unspecified: Secondary | ICD-10-CM

## 2022-07-03 DIAGNOSIS — C787 Secondary malignant neoplasm of liver and intrahepatic bile duct: Secondary | ICD-10-CM

## 2022-07-03 DIAGNOSIS — C189 Malignant neoplasm of colon, unspecified: Secondary | ICD-10-CM

## 2022-07-03 DIAGNOSIS — Z7189 Other specified counseling: Secondary | ICD-10-CM

## 2022-07-03 DIAGNOSIS — R1031 Right lower quadrant pain: Secondary | ICD-10-CM

## 2022-07-03 DIAGNOSIS — Z79899 Other long term (current) drug therapy: Secondary | ICD-10-CM | POA: Insufficient documentation

## 2022-07-03 DIAGNOSIS — Z801 Family history of malignant neoplasm of trachea, bronchus and lung: Secondary | ICD-10-CM

## 2022-07-03 DIAGNOSIS — N951 Menopausal and female climacteric states: Secondary | ICD-10-CM

## 2022-07-03 DIAGNOSIS — R053 Chronic cough: Secondary | ICD-10-CM

## 2022-07-03 DIAGNOSIS — Z86018 Personal history of other benign neoplasm: Secondary | ICD-10-CM

## 2022-07-03 DIAGNOSIS — N9489 Other specified conditions associated with female genital organs and menstrual cycle: Secondary | ICD-10-CM

## 2022-07-03 DIAGNOSIS — D509 Iron deficiency anemia, unspecified: Secondary | ICD-10-CM | POA: Diagnosis not present

## 2022-07-03 DIAGNOSIS — D649 Anemia, unspecified: Secondary | ICD-10-CM

## 2022-07-03 DIAGNOSIS — Z510 Encounter for antineoplastic radiation therapy: Secondary | ICD-10-CM | POA: Diagnosis present

## 2022-07-03 DIAGNOSIS — Z5112 Encounter for antineoplastic immunotherapy: Secondary | ICD-10-CM | POA: Insufficient documentation

## 2022-07-03 DIAGNOSIS — R9389 Abnormal findings on diagnostic imaging of other specified body structures: Secondary | ICD-10-CM

## 2022-07-03 DIAGNOSIS — D219 Benign neoplasm of connective and other soft tissue, unspecified: Secondary | ICD-10-CM

## 2022-07-03 DIAGNOSIS — K449 Diaphragmatic hernia without obstruction or gangrene: Secondary | ICD-10-CM

## 2022-07-03 DIAGNOSIS — R3129 Other microscopic hematuria: Secondary | ICD-10-CM

## 2022-07-03 DIAGNOSIS — Z800 Family history of malignant neoplasm of digestive organs: Secondary | ICD-10-CM

## 2022-07-03 DIAGNOSIS — L280 Lichen simplex chronicus: Secondary | ICD-10-CM

## 2022-07-03 DIAGNOSIS — G4700 Insomnia, unspecified: Secondary | ICD-10-CM

## 2022-07-03 DIAGNOSIS — R197 Diarrhea, unspecified: Secondary | ICD-10-CM | POA: Insufficient documentation

## 2022-07-03 DIAGNOSIS — N950 Postmenopausal bleeding: Secondary | ICD-10-CM

## 2022-07-03 DIAGNOSIS — E559 Vitamin D deficiency, unspecified: Secondary | ICD-10-CM

## 2022-07-03 LAB — CBC WITH DIFFERENTIAL (CANCER CENTER ONLY)
Abs Immature Granulocytes: 0.05 10*3/uL (ref 0.00–0.07)
Basophils Absolute: 0 10*3/uL (ref 0.0–0.1)
Basophils Relative: 0 %
Eosinophils Absolute: 0.1 10*3/uL (ref 0.0–0.5)
Eosinophils Relative: 3 %
HCT: 35.8 % — ABNORMAL LOW (ref 36.0–46.0)
Hemoglobin: 11.4 g/dL — ABNORMAL LOW (ref 12.0–15.0)
Immature Granulocytes: 1 %
Lymphocytes Relative: 14 %
Lymphs Abs: 0.8 10*3/uL (ref 0.7–4.0)
MCH: 30.2 pg (ref 26.0–34.0)
MCHC: 31.8 g/dL (ref 30.0–36.0)
MCV: 94.7 fL (ref 80.0–100.0)
Monocytes Absolute: 0.4 10*3/uL (ref 0.1–1.0)
Monocytes Relative: 8 %
Neutro Abs: 3.9 10*3/uL (ref 1.7–7.7)
Neutrophils Relative %: 74 %
Platelet Count: 161 10*3/uL (ref 150–400)
RBC: 3.78 MIL/uL — ABNORMAL LOW (ref 3.87–5.11)
RDW: 15.3 % (ref 11.5–15.5)
WBC Count: 5.3 10*3/uL (ref 4.0–10.5)
nRBC: 0 % (ref 0.0–0.2)

## 2022-07-03 LAB — CMP (CANCER CENTER ONLY)
ALT: 18 U/L (ref 0–44)
AST: 33 U/L (ref 15–41)
Albumin: 3.9 g/dL (ref 3.5–5.0)
Alkaline Phosphatase: 181 U/L — ABNORMAL HIGH (ref 38–126)
Anion gap: 8 (ref 5–15)
BUN: 8 mg/dL (ref 6–20)
CO2: 27 mmol/L (ref 22–32)
Calcium: 9.2 mg/dL (ref 8.9–10.3)
Chloride: 105 mmol/L (ref 98–111)
Creatinine: 0.48 mg/dL (ref 0.44–1.00)
GFR, Estimated: >60 mL/min (ref >60)
Glucose, Bld: 87 mg/dL (ref 70–99)
Potassium: 3.8 mmol/L (ref 3.5–5.1)
Sodium: 140 mmol/L (ref 135–145)
Total Bilirubin: 0.2 mg/dL — ABNORMAL LOW (ref 0.3–1.2)
Total Protein: 6.8 g/dL (ref 6.5–8.1)

## 2022-07-03 LAB — CEA (IN HOUSE-CHCC): CEA (CHCC-In House): 11.43 ng/mL — ABNORMAL HIGH (ref 0.00–5.00)

## 2022-07-03 LAB — IRON AND IRON BINDING CAPACITY (CC-WL,HP ONLY)
Iron: 42 ug/dL (ref 28–170)
Saturation Ratios: 14 % (ref 10.4–31.8)
TIBC: 311 ug/dL (ref 250–450)
UIBC: 269 ug/dL (ref 148–442)

## 2022-07-03 LAB — LACTATE DEHYDROGENASE: LDH: 381 U/L — ABNORMAL HIGH (ref 98–192)

## 2022-07-03 LAB — RETICULOCYTES
Immature Retic Fract: 9.8 % (ref 2.3–15.9)
RBC.: 3.79 MIL/uL — ABNORMAL LOW (ref 3.87–5.11)
Retic Count, Absolute: 53.1 10*3/uL (ref 19.0–186.0)
Retic Ct Pct: 1.4 % (ref 0.4–3.1)

## 2022-07-03 LAB — FERRITIN: Ferritin: 176 ng/mL (ref 11–307)

## 2022-07-03 MED ORDER — ZOLEDRONIC ACID 4 MG/100ML IV SOLN
4.0000 mg | Freq: Once | INTRAVENOUS | Status: AC
  Administered 2022-07-03: 4 mg via INTRAVENOUS
  Filled 2022-07-03: qty 100

## 2022-07-03 MED ORDER — FENTANYL 25 MCG/HR TD PT72
1.0000 | MEDICATED_PATCH | TRANSDERMAL | 0 refills | Status: DC
  Filled 2022-07-03: qty 10, 30d supply, fill #0

## 2022-07-03 MED ORDER — KETOROLAC TROMETHAMINE 15 MG/ML IJ SOLN
30.0000 mg | Freq: Once | INTRAMUSCULAR | Status: AC
  Administered 2022-07-03: 30 mg via INTRAVENOUS
  Filled 2022-07-03: qty 2

## 2022-07-03 MED ORDER — LONSURF 20-8.19 MG PO TABS
35.0000 mg/m2 | ORAL_TABLET | Freq: Two times a day (BID) | ORAL | 5 refills | Status: DC
  Filled 2022-07-03: qty 80, 10d supply, fill #0

## 2022-07-03 MED ORDER — OXYCODONE HCL 10 MG PO TABS
10.0000 mg | ORAL_TABLET | Freq: Four times a day (QID) | ORAL | 0 refills | Status: DC | PRN
  Filled 2022-07-03: qty 60, 15d supply, fill #0

## 2022-07-03 MED ORDER — HEPARIN SOD (PORK) LOCK FLUSH 100 UNIT/ML IV SOLN
500.0000 [IU] | Freq: Once | INTRAVENOUS | Status: AC
  Administered 2022-07-03: 500 [IU] via INTRAVENOUS

## 2022-07-03 MED ORDER — SODIUM CHLORIDE 0.9 % IV SOLN: Freq: Once | INTRAVENOUS | Status: AC

## 2022-07-03 MED ORDER — SODIUM CHLORIDE 0.9% FLUSH
10.0000 mL | INTRAVENOUS | Status: DC | PRN
  Administered 2022-07-03: 10 mL via INTRAVENOUS

## 2022-07-03 NOTE — Telephone Encounter (Signed)
Oral Oncology Patient Advocate Encounter  After completing a benefits investigation, prior authorization for Lonsurf is not required at this time through [place] ([employer]).  Patient's copay is $4.00.     Berdine Addison, [facility] Patient [place]  [phone] (phone) [phone] (fax) 07/03/2022 2:37 PM

## 2022-07-03 NOTE — Progress Notes (Signed)
Hematology and Oncology Follow Up Visit  Rebecca Bullock [id number] [birth year] 57 y.o. 07/03/2022   Principle Diagnosis:  Metastatic colorectal cancer-liver metastasis- BRAF (+)   Current Therapy:        Status post cycle 1 of chemotherapy with FOLFOXIRI/Avastin Encorafenib/Vectibix -- started on 06/19/2021, s/p cycle #6 -- d/c on 10/31/2021 Intrahepatic TACE -- 03/29/2022 FOLFOXIRI/Avastin-- s/p cycle  #5-- start on 02/19/2022, -oxaliplatin dropped on 04/03/2022 IV iron-Ferrlecit given on 05/10/2022                Interim History:  Rebecca Bullock is here today for follow-up.  She has been having some abdominal issues.  She did have this abdominal pain.  This is epigastric in nature.  She says that it goes down to her umbilicus.  She has had some vomiting and diarrhea starting Sunday.  She took some antidiarrheal medication.  This is seem to help.  She does not think there is anything that she ate.  She did see Interventional Radiology recently.  This was a telephone visit.  They reviewed the MRI of her liver.  They felt that everything looked fine and that the appearance was consistent with the radiotherapy that she had.  Her last CEA level was 10.5.  She has had no obvious bleeding.  There is been no urinary difficulty.  We have given her iron in the past.  Her last iron studies back on 06/12/2022 showed a ferritin of 216 with an iron saturation of 20%.  Her grandson is doing okay.  I think is about 78 months old now.  She overall, has a performance status of ECOG 1.     Medications:  Allergies as of 07/03/2022       Reactions   Augmentin [amoxicillin-pot Clavulanate] Diarrhea   Extreme diarrhea, abdominal pain.   Irinotecan Other (See Comments)   Flushing, tingling, visual disturbance   Metronidazole Diarrhea   Vancomycin Rash   ?red syndrome   Tape Rash        Medication List        Accurate as of July 03, 2022  8:11 AM. If you have any questions, ask your nurse or  doctor.          Acetaminophen 325 MG Caps Take 650 mg by mouth every 8 (eight) hours as needed for moderate pain or headache.   betamethasone valerate 0.1 % cream Commonly known as: VALISONE Apply 1 application  topically See admin instructions. Apply 1 application topically every other day as needed for psoriasis flare   cyclobenzaprine 10 MG tablet Commonly known as: FLEXERIL Take 1 tablet (10 mg total) by mouth 3 (three) times daily as needed for muscle spasms.   diphenoxylate-atropine 2.5-0.025 MG tablet Commonly known as: LOMOTIL Take 2 tablets by mouth 4 (four) times daily as needed for diarrhea or loose stools.   estradiol 0.1 MG/GM vaginal cream Commonly known as: ESTRACE Place 1 Applicatorful vaginally daily as needed (dryness / irritation).   famotidine 40 MG tablet Commonly known as: PEPCID Take 1 tablet (40 mg total) by mouth 2 (two) times daily.   hydrocortisone 1 % lotion Apply 1 application topically 2 (two) times daily as needed for itching.   OLANZapine 10 MG tablet Commonly known as: ZYPREXA Take 1 tablet (10 mg total) by mouth at bedtime.   traMADol 50 MG tablet Commonly known as: ULTRAM Take 1 tablet (50 mg total) by mouth every 6 (six) hours as needed.        Allergies:  Allergies  Allergen Reactions  . Augmentin [Amoxicillin-Pot Clavulanate] Diarrhea    Extreme diarrhea, abdominal pain.  . Irinotecan Other (See Comments)    Flushing, tingling, visual disturbance  . Metronidazole Diarrhea  . Vancomycin Rash    ?red syndrome  . Tape Rash    Past Medical History, Surgical history, Social history, and Family History were reviewed and updated.  Review of Systems: Review of Systems  Constitutional: Negative.   HENT: Negative.    Eyes: Negative.   Respiratory: Negative.    Cardiovascular: Negative.   Gastrointestinal: Negative.        Constipation alternating with diarrhea.   Genitourinary: Negative.   Musculoskeletal: Negative.    Skin: Negative.   Neurological: Negative.   Endo/Heme/Allergies: Negative.   Psychiatric/Behavioral: Negative.       Physical Exam: Her vital signs show temperature of 97.8.  Pulse 92.  Blood pressure is 111/67.  Weight is 184 pounds.  Wt Readings from Last 3 Encounters:  06/26/22 181 lb (82.1 kg)  06/12/22 184 lb (83.5 kg)  05/22/22 184 lb 1.9 oz (83.5 kg)    Physical Exam Vitals reviewed.  HENT:     Head: Normocephalic and atraumatic.  Eyes:     Pupils: Pupils are equal, round, and reactive to light.  Cardiovascular:     Rate and Rhythm: Normal rate and regular rhythm.     Heart sounds: Normal heart sounds.  Pulmonary:     Effort: Pulmonary effort is normal.     Breath sounds: Normal breath sounds.  Abdominal:     General: Bowel sounds are normal.     Palpations: Abdomen is soft.  Musculoskeletal:        General: No tenderness or deformity. Normal range of motion.     Cervical back: Normal range of motion.  Lymphadenopathy:     Cervical: No cervical adenopathy.  Skin:    General: Skin is warm and dry.     Findings: No erythema or rash.  Neurological:     Mental Status: She is alert and oriented to person, place, and time.  Psychiatric:        Behavior: Behavior normal.        Thought Content: Thought content normal.        Judgment: Judgment normal.     Lab Results  Component Value Date   WBC 7.5 06/26/2022   HGB 14.2 06/26/2022   HCT 44.1 06/26/2022   MCV 94.6 06/26/2022   PLT 146 (L) 06/26/2022   Lab Results  Component Value Date   FERRITIN 216 06/12/2022   IRON 73 06/12/2022   TIBC 361 06/12/2022   UIBC 288 06/12/2022   IRONPCTSAT 20 06/12/2022   Lab Results  Component Value Date   RETICCTPCT 1.4 06/12/2022   RBC 4.66 06/26/2022   No results found for: "KPAFRELGTCHN", "LAMBDASER", "KAPLAMBRATIO" No results found for: "IGGSERUM", "IGA", "IGMSERUM" No results found for: "TOTALPROTELP", "ALBUMINELP", "A1GS", "A2GS", "BETS", "BETA2SER",  "GAMS", "MSPIKE", "SPEI"   Chemistry      Component Value Date/Time   NA 138 06/26/2022 0753   K 3.8 06/26/2022 0753   CL 102 06/26/2022 0753   CO2 25 06/26/2022 0753   BUN 14 06/26/2022 0753   CREATININE 0.84 06/26/2022 0753   CREATININE 0.56 03/23/2021 0000      Component Value Date/Time   CALCIUM 9.5 06/26/2022 0753   ALKPHOS 170 (H) 06/26/2022 0753   AST 20 06/26/2022 0753   ALT 17 06/26/2022 0753   BILITOT 0.5 06/26/2022 0753  Impression and Plan: Ms. Cargile is a very charming  57 yo caucasian female with metastatic colon cancer.  Again, her disease has been confined to her liver.  The primary is in the ascending colon.  We will not treated today.  I do still think that I feel confident in treating her today.  I am not sure what to make of this abdominal pain.  I told her to take an antiacid on a regular schedule.  We will have her come back in 1 week and see how she is doing.  If the CEA level continues to elevate, then I think our next approach would have to be doing a biopsy so we can get some molecular markers.  I hate to have to be invasive but I think that this would have to be the best way for [place] to try to help her out.   [name], MD 2/6/20248:11 AM

## 2022-07-03 NOTE — Patient Instructions (Signed)

## 2022-07-03 NOTE — Progress Notes (Signed)
Per plan, Oxycodone 10 mg tabs approved through 01/01/23 & Fentanyl patch 25 mcg approved through 10/01/22.

## 2022-07-03 NOTE — Telephone Encounter (Incomplete)
Oral Oncology Pharmacist Encounter  Received new prescription for Lonsurf for the treatment of metastatic colorectal cancer with liver metastases (BRAF +) in conjunction with Avastin, planned duration until progression or unacceptable toxicity.  Labs from 07/03/2022 assessed, alkaline phosphatase was a little high but considering the presence of liver metastases, this is not abnormal. Counts relating to RBCs were also slightly low, but not concerning.  Current medication list in Epic reviewed, DDIs were not found. Prescription dose and frequency assessed for appropriateness.  Evaluated chart and no patient barriers to medication adherence noted.   Patient agreement for treatment documented in MD note on 07/03/2022.  Prescription has been e-scribed to the [facility] for benefits analysis and approval.  Oral Oncology Clinic will continue to follow for insurance authorization, copayment issues, initial counseling and start date.   [employer] PharmD Candidate [phone]  07/03/2022 3:44 PM Oral Oncology Clinic [phone]

## 2022-07-04 ENCOUNTER — Other Ambulatory Visit (HOSPITAL_BASED_OUTPATIENT_CLINIC_OR_DEPARTMENT_OTHER): Payer: Self-pay

## 2022-07-04 ENCOUNTER — Other Ambulatory Visit (HOSPITAL_COMMUNITY): Payer: Self-pay

## 2022-07-04 ENCOUNTER — Encounter: Payer: Self-pay | Admitting: Hematology & Oncology

## 2022-07-04 ENCOUNTER — Encounter: Payer: Self-pay | Admitting: Family

## 2022-07-04 MED ORDER — LONSURF 20-8.19 MG PO TABS
60.0000 mg | ORAL_TABLET | Freq: Two times a day (BID) | ORAL | 5 refills | Status: DC
  Filled 2022-07-04 (×2): qty 60, 28d supply, fill #0
  Filled 2022-07-04: qty 60, 10d supply, fill #0
  Filled 2022-07-30: qty 60, 28d supply, fill #1

## 2022-07-04 MED ORDER — CYCLOBENZAPRINE HCL 10 MG PO TABS
10.0000 mg | ORAL_TABLET | Freq: Three times a day (TID) | ORAL | 3 refills | Status: DC | PRN
  Filled 2022-07-04: qty 63, 21d supply, fill #0
  Filled 2022-08-01: qty 63, 21d supply, fill #1

## 2022-07-04 NOTE — Progress Notes (Signed)
Patient has progressive disease. She will be referred to radiation for treatment to her spine and her treatment will be changed.   Oncology Nurse Navigator Documentation     07/03/2022   12:45 PM  Oncology Nurse Navigator Flowsheets  Navigator Follow Up Date: 07/17/2022  Navigator Follow Up Reason: Follow-up Appointment  Navigator Location [facility]  Navigator Encounter Type Appt/Treatment Plan Review  Patient Visit Type MedOnc  Treatment Phase Active Tx  Barriers/Navigation Needs No Barriers At This Time  Interventions None Required  Acuity Level 1-No Barriers  Support Groups/Services Friends and Family  Time Spent with Patient 15

## 2022-07-04 NOTE — Telephone Encounter (Signed)
Oral Oncology Pharmacist Encounter  Reached out to Dr. [name] about dosing of 80 mg BID for Lonsurf, as this is ~16% over patient's ideal dose. Confirmed with Dr. [name] OK to round patients dose to 60 mg BID for days 1-5, 8-15 repeated every 28 days as this is more close to patient's actual calculated dose of 67.55 mg.  Prescription updated and resent to [facility] for dispensing.   Leron Croak, PharmD, BCPS, [facility] Hematology/Oncology Clinical Pharmacist [facility] [place] [phone] 07/04/2022 8:35 AM

## 2022-07-04 NOTE — Telephone Encounter (Signed)
Oral Chemotherapy Pharmacy Student Encounter  Expected start date is 07/09/2022.  Patient Education I spoke with patient for overview of new oral chemotherapy medication: Lonsurf for the treatment of metastatic colorectal cancer with liver metastases (BRAF +), planned duration until disease progression or unacceptable toxicity.   Pt is doing well. Counseled patient on administration, dosing, side effects, monitoring, drug-drug interactions (none at this time), safe handling, storage, and disposal.  Patient will take 3 tablets by mouth twice daily 1 hour after meals on days 1-5 and 8-12, repeating every 28 days.  Side effects include but not limited to: diarrhea (patient states she has Imodium on-hand), nausea (would like a script for nausea medication), and fatigue. Also discussed the importance of labs to monitor her blood counts.  Reviewed with patient importance of keeping a medication schedule and plan for any missed doses. Patient elected to receive a calendar for her first cycle, which has been made and mailed to her home.  After discussion with patient, no patient barriers to medication adherence were identified.   Rebecca Bullock voiced understanding and appreciation. All questions answered. Medication handout provided.  Provided patient with [facility] phone number. Patient knows to call the office with questions or concerns.  [employer] PharmD Candidate [phone] 07/04/2022 1:05 PM

## 2022-07-05 ENCOUNTER — Other Ambulatory Visit: Payer: Self-pay

## 2022-07-05 ENCOUNTER — Other Ambulatory Visit (HOSPITAL_COMMUNITY): Payer: Self-pay

## 2022-07-05 NOTE — Progress Notes (Signed)
Histology and Location of Primary Cancer:  Metastatic colorectal cancer-liver metastasis   Sites of Visceral and Bony Metastatic Disease:  MRI Lumbar Spine w/ & w/o Contrast 07/02/2022 --IMPRESSION: This study confirms the presence of metastatic disease within the right side and central portion of the L1 vertebral body. Tumor extends into the posterior elements on the right. Small amount of extraosseous tumor along the lateral margin of the vertebral body and along the ventral epidural space to the right of midline, only 1 or 2 mm thick, without encroachment upon the neural structures at this time. No second lesion seen in the region of the scan.  CT A/P w/ Contrast  06/28/2022 IMPRESSION: Allowing for differences in technique there has been no significant change in volume of tumor within the right hepatic lobe. The dominant lesion is stable in volume. There is a small adjacent lesion which is slightly increased in size in the interval. Similar size of portacaval and retroperitoneal nodal metastasis. New peripherally sclerotic lesion is identified within the right-side of the L1 vertebral body, suspicious for osseous metastatic disease. Similar appearance of concentric lesion involving the mid ascending colon.  Location(s) of Symptomatic Metastases: Right Lower back, Right hip  Past/Anticipated chemotherapy by medical oncology, if any:  Under care of Dr. Burney Gauze 07/03/2022 --Current Therapy:        Status post cycle 1 of chemotherapy with FOLFOXIRI/Avastin Encorafenib/Vectibix -- started on 06/19/2021, s/p cycle #6 -- d/c on 10/31/2021 Intrahepatic TACE -- 03/29/2022 FOLFOXIRI/Avastin-- s/p cycle  #5-- start on 02/19/2022, -oxaliplatin dropped on 04/03/2022 -- d/c on 07/03/2022 IV iron-Ferrlecit given on 05/10/2022  Lonsurf/Avastin -- start cycle #1 on 07/07/2022 Zometa 4 mg IV every 3 months -start on 07/03/2022 --Impression and Plan: She clearly now has metastatic disease outside  the liver.   As such, I just do not suspect that surgery will ever be an option for her. When we can still certainly consider intrahepatic therapy since this is where the majority of her tumor resides. We will hopefully be able to start radiation therapy on her next week.   I told her that I would suspect that she will get 10 treatments.   Dr. Sondra Come will be able to talk to her about side effects. Will have to make a change in her protocol.   We will start on Lonsurf.  I sent the prescription in.   We will use this with Avastin. I know that her tumor does have the BRAF mutation.   I know that we have tried her on anti-BRAF therapy in the past.   She had a very transient response to this. I would like to see her back in a couple weeks.   We will have to see how she is doing with radiation.   Hopefully, the Duragesic patch in the oxycodone will be able to help her a little bit. In the office today, she will get Zometa.   I will also make sure that we give her some Toradol to see if this may help with some anti-inflammatory component of her discomfort.  Pain on a scale of 0-10 is: 20/10 pain to her lower right back and right hip.  She reports the pain occasionally radiates across her lower back.  Taking Oxycodone 10 mg q6, Flexeril 10 mg TID.    If Spine Met(s), symptoms, if any, include: Numbness or weakness in extremities (please describe): None   Ambulatory status? Walker? Wheelchair?: Ambulatory  SAFETY ISSUES: Prior radiation? Y-90 03/29/2022 Pacemaker/ICD? No Possible current  pregnancy? Ablation Is the patient on methotrexate? No   Current Complaints / other details:   Right Chest [facility]

## 2022-07-06 NOTE — Progress Notes (Signed)
Radiation Oncology         [phone] ________________________________  Initial Outpatient Consultation  Name: [name] MRN: [id number]  Date: 07/09/2022  DOB: August 09, 1965  [id number], [name], PA-C  Ennever, [name], MD   REFERRING PHYSICIAN: [name], MD  DIAGNOSIS: There were no encounter diagnoses.  {diagnosis}  HISTORY OF PRESENT ILLNESS::Rebecca Bullock is a 57 y.o. female who is accompanied by ***. she is seen as a courtesy of Dr. [name] for an opinion concerning radiation therapy as part of management for her recently diagnosed ***.   The patient was initially diagnosed with right colon cancer in November 2022 after a colonoscopy on 04/18/21 which revealed a mass in ascending colon. Biopsy of the mass revealed invasive moderately differentiated adenocarcinoma. CT of the chest performed on 04/05/21 (for evaluation of a chronic cough) also incidentally revealed a nonspecific 7.7 cm lesion on the right lobe of the liver.  On record review, she also presented to her PCP around this time with nonspecific complaints including significant fatigue, shortness of breath, hair loss, and an unintentional weight loss of approximately 20 pounds in November 2022.   Prior to her diagnosis, the patient was already established with oncology for management of iron deficiency anemia.   The patient also developed pelvic pain and post-menopausal bleeding in Novemebr 2022. Work-up included an MRI of the pelvis with and without contrast on 04/24/21 which showed an abnormally thickened postmenopausal endometrium measuring at least 1.6 cm, with hemorrhagic or proteinaceous fluid in the endometrial cavity, raising concern for endometrial hyperplasia or carcinoma. MRI otherwise showed no evidence of pelvic lymphadenopathy or evidence of metastatic colon cancer in the pelvis.     The patient's PCP also ordered an MRI of the abdomen on 05/01/21 which redemonstrated a large, heterogeneously contrast  enhancing mass of the posterior right lobe of the liver, measuring 9.4 x 9.2 cm, most consistent with a large hepatic metastasis in the setting of the patient's recent diagnosis of colon cancer. MRI also showed a heterogeneously contrast enhancing aortocaval node measuring 2.1 x 1.9 cm, suspicious for a necrotic nodal metastasis, and a lobulated, thinly septated fluid signal subcapsular lesion of the superior spleen measuring 1.6 x 1.4 cm, most consistent with a benign splenic cyst.  The patient established care with Dr. [name] in November 2022. For further evaluation of the liver lesion, and to complete staging work-up for treatment planning, Dr. [name] advised proceeding with a PET scan. PET scan on 05/02/21 demonstrated: the hypermetabolic ascending colon lesion consistent with the known primary colonic neoplasm, the metastatic right hepatic lobe lesion, and enlarged and hypermetabolic abdominal lymph nodes. PET otherwise showed no evidence of metastatic disease involving the chest or bony structures.   She then began chemotherapy consisting of FOLFIRINOX on 05/16/21. Molecular study results that came in after her first cycle of systemic treatment revealed the BRAF mutation. Accordingly, Dr. [name] transitioned her to targeted therapy consisting of encorafinib and Vectibix on 06/19/21.   Restaging PET scan on 08/04/21 showed an interval response to therapy, demonstrated by an overall decrease in hypermetabolism within the primary ascending colon mass, right hepatic lobe mass and periportal/aortocaval lymph nodes. A slight decrease in size of the right hepatic lesion was also appreciated.   She completed her 6th and final cycle of Encorafenib/Vectibix on 10/31/21.   Restaging PET scan on 10/30/21 demonstrated an interval increase in hypermetabolism associated with the right hepatic lesion (otherwise remained stable in size), and an interval increase in size and hypermetabolism  of a portal caval lymph  node in the hepatoduodenal ligament. PET otherwise showed no evidence of hypermetabolic metastatic disease in the chest or pelvis.  Subsequently, Dr. [name] advised returning to chemo consisting of FOLFOXIRI with the goal of shrinking her cancer enough to allow for surgery. She received her first cycle on 11/08/21.   The patient was evaluated by Dr. [name] (surgical oncology a [facility]) on 12/26/21 who recommended proceeding with a partial hepatectomy to remove the primary site of disease in the ascending colon after she finishes treatment.   She was also evaluated by [name] who treated her with IV iron-Ferrlecit on 01/12/2022.   Unfortunately, the patient had a CT of the abdomen and pelvis performed at [facility] on 02/12/22 which showed findings concerning for interval progression of hepatic metastatic disease predominantly involving segments VII and VIII, and segments V and VI to a lesser extent. CT also showed the tumor to obliterate the right hepatic vein and abut portions of the middle hepatic vein. CT otherwise showed stable portacaval, aortocaval, and ileocolic adenopathy, and similar irregular thickening of the ascending colon compatible with neoplasm.    Due to evidence of disease progression noted above, Dr. [name] advised that she pursue further systemic therapy prior to re-considering surgery. Subsequently, she was resumed on FOLFOXIRI on 02/19/22. Oxaliplatin was also added to her regimen, however this was discontinued on 04/03/22 due to issues with neuropathy.   ***    She also has a  Y-90 procedure performed on 03/29/22.   PREVIOUS RADIATION THERAPY: {EXAM; YES/NO:19492::"No"}  PAST MEDICAL HISTORY:  Past Medical History:  Diagnosis Date   Colon cancer metastasized to liver ([place]) 05/05/2021   Family history of lung cancer    Family history of pancreatic cancer    Goals of care, counseling/discussion 05/05/2021    PAST SURGICAL HISTORY: Past Surgical  History:  Procedure Laterality Date   IR 3D INDEPENDENT WKST  03/15/2022   IR ANGIOGRAM SELECTIVE EACH ADDITIONAL VESSEL  03/15/2022   IR ANGIOGRAM SELECTIVE EACH ADDITIONAL VESSEL  03/15/2022   IR ANGIOGRAM SELECTIVE EACH ADDITIONAL VESSEL  03/15/2022   IR ANGIOGRAM SELECTIVE EACH ADDITIONAL VESSEL  03/29/2022   IR ANGIOGRAM SELECTIVE EACH ADDITIONAL VESSEL  03/29/2022   IR ANGIOGRAM VISCERAL SELECTIVE  03/15/2022   IR ANGIOGRAM VISCERAL SELECTIVE  03/29/2022   IR EMBO ARTERIAL NOT HEMORR HEMANG INC GUIDE ROADMAPPING  03/15/2022   IR EMBO TUMOR ORGAN ISCHEMIA INFARCT INC GUIDE ROADMAPPING  03/29/2022   IR IMAGING GUIDED PORT INSERTION  05/11/2021   IR RADIOLOGIST EVAL & MGMT  02/15/2022   IR RADIOLOGIST EVAL & MGMT  04/23/2022   IR RADIOLOGIST EVAL & MGMT  06/25/2022   IR US GUIDE VASC ACCESS RIGHT  03/29/2022   uterine ablation      FAMILY HISTORY:  Family History  Problem Relation Age of Onset   Lung cancer Mother    Diabetes Maternal Grandmother    Pancreatic cancer Maternal Grandfather    Multiple sclerosis Maternal Aunt    Alcohol abuse Maternal Uncle     SOCIAL HISTORY:  Social History   Tobacco Use   Smoking status: Never   Smokeless tobacco: Never  Vaping Use   Vaping Use: Never used  Substance Use Topics   Alcohol use: Yes    Comment: occasionally   Drug use: Not Currently    ALLERGIES:  Allergies  Allergen Reactions   Augmentin [Amoxicillin-Pot Clavulanate] Diarrhea    Extreme diarrhea, abdominal pain.   Irinotecan  Other (See Comments)    Flushing, tingling, visual disturbance   Metronidazole Diarrhea   Vancomycin Rash    ?red syndrome   Tape Rash    MEDICATIONS:  Current Outpatient Medications  Medication Sig Dispense Refill   Acetaminophen 325 MG CAPS Take 650 mg by mouth every 8 (eight) hours as needed for moderate pain or headache.     betamethasone valerate (VALISONE) 0.1 % cream Apply 1 application  topically See admin instructions. Apply 1  application topically every other day as needed for psoriasis flare     cyclobenzaprine (FLEXERIL) 10 MG tablet Take 1 tablet (10 mg total) by mouth 3 (three) times daily as needed for muscle spasms. 90 tablet 3   diphenoxylate-atropine (LOMOTIL) 2.5-0.025 MG tablet Take 2 tablets by mouth 4 (four) times daily as needed for diarrhea or loose stools. 100 tablet 0   estradiol (ESTRACE) 0.1 MG/GM vaginal cream Place 1 Applicatorful vaginally daily as needed (dryness / irritation).     famotidine (PEPCID) 40 MG tablet Take 1 tablet (40 mg total) by mouth 2 (two) times daily. 60 tablet 4   fentaNYL (DURAGESIC) 25 MCG/HR Place 1 patch onto the skin every 3 (three) days. 10 patch 0   hydrocortisone 1 % lotion Apply 1 application topically 2 (two) times daily as needed for itching. (Patient not taking: Reported on 05/22/2022) 120 mL 3   OLANZapine (ZYPREXA) 10 MG tablet Take 1 tablet (10 mg total) by mouth at bedtime. 30 tablet 3   Oxycodone HCl 10 MG TABS Take 1 tablet (10 mg total) by mouth every 6 (six) hours as needed for severe pain. 60 tablet 0   traMADol (ULTRAM) 50 MG tablet Take 1 tablet (50 mg total) by mouth every 6 (six) hours as needed. 60 tablet 0   trifluridine-tipiracil (LONSURF) 20-8.19 MG tablet Take 3 tablets (60 mg of trifluridine total) by mouth 2 (two) times daily after a meal. Take within 1 hr after AM & PM meals on days 1-5, 8-12. Repeat every 28 days. 60 tablet 5   No current facility-administered medications for this encounter.   Facility-Administered Medications Ordered in Other Encounters  Medication Dose Route Frequency Provider Last Rate Last Admin   atropine 1 MG/ML injection            heparin lock flush 100 unit/mL  500 Units Intravenous Once Ennever, [name], MD       sodium chloride flush (NS) 0.9 % injection 10 mL  10 mL Intracatheter Once PRN Celso Amy, NP       sodium chloride flush (NS) 0.9 % injection 10 mL  10 mL Intravenous PRN [name], MD   10 mL  at 10/31/21 1258    REVIEW OF SYSTEMS:  A 10+ POINT REVIEW OF SYSTEMS WAS OBTAINED including neurology, dermatology, psychiatry, cardiac, respiratory, lymph, extremities, GI, GU, musculoskeletal, constitutional, reproductive, HEENT. ***   PHYSICAL EXAM:  vitals were not taken for this visit.   General: Alert and oriented, in no acute distress HEENT: Head is normocephalic. Extraocular movements are intact. Oropharynx is clear. Neck: Neck is supple, no palpable cervical or supraclavicular lymphadenopathy. Heart: Regular in rate and rhythm with no murmurs, rubs, or gallops. Chest: Clear to auscultation bilaterally, with no rhonchi, wheezes, or rales. Abdomen: Soft, nontender, nondistended, with no rigidity or guarding. Extremities: No cyanosis or edema. Lymphatics: see Neck Exam Skin: No concerning lesions. Musculoskeletal: symmetric strength and muscle tone throughout. Neurologic: Cranial nerves II through XII are grossly intact.  No obvious focalities. Speech is fluent. Coordination is intact. Psychiatric: Judgment and insight are intact. Affect is appropriate. ***  ECOG = ***  0 - Asymptomatic (Fully active, able to carry on all predisease activities without restriction)  1 - Symptomatic but completely ambulatory (Restricted in physically strenuous activity but ambulatory and able to carry out work of a light or sedentary nature. For example, light housework, office work)  2 - Symptomatic, <50% in bed during the day (Ambulatory and capable of all self care but unable to carry out any work activities. Up and about more than 50% of waking hours)  3 - Symptomatic, >50% in bed, but not bedbound (Capable of only limited self-care, confined to bed or chair 50% or more of waking hours)  4 - Bedbound (Completely disabled. Cannot carry on any self-care. Totally confined to bed or chair)  5 - Death   Eustace Pen MM, Creech RH, Tormey DC, et al. [phone]). "Toxicity and response criteria of the [facility] Group". [place] Oncol. 5 (6): 649-55  LABORATORY DATA:  Lab Results  Component Value Date   WBC 5.3 07/03/2022   HGB 11.4 (L) 07/03/2022   HCT 35.8 (L) 07/03/2022   MCV 94.7 07/03/2022   PLT 161 07/03/2022   NEUTROABS 3.9 07/03/2022   Lab Results  Component Value Date   NA 140 07/03/2022   K 3.8 07/03/2022   CL 105 07/03/2022   CO2 27 07/03/2022   GLUCOSE 87 07/03/2022   BUN 8 07/03/2022   CREATININE 0.48 07/03/2022   CALCIUM 9.2 07/03/2022      RADIOGRAPHY: MR Lumbar Spine W Wo Contrast  Result Date: 07/02/2022 CLINICAL DATA:  Low back pain. Cancer suspected at L1 based on recent CT scan. EXAM: MRI LUMBAR SPINE WITHOUT AND WITH CONTRAST TECHNIQUE: Multiplanar and multiecho pulse sequences of the lumbar spine were obtained without and with intravenous contrast. CONTRAST:  103m GADAVIST GADOBUTROL 1 MMOL/ML IV SOLN COMPARISON:  CT 06/28/2022 FINDINGS: Segmentation:  5 lumbar type vertebral bodies. Alignment:  Normal alignment. Vertebrae: This study confirms the presence of metastatic disease within the right side and central portion of the L1 vertebral body. Tumor extends into the posterior elements on the right. Small amount of extraosseous tumor along the lateral margin of the vertebral body and along the ventral epidural space to the right of midline, only 1 or 2 mm thick, without encroachment upon the neural structures at this time. No second lesion seen in the region of the scan. Conus medullaris and cauda equina: Conus extends to the L1 level. Conus and cauda equina appear normal. Paraspinal and other soft tissues: Known metastatic disease to the liver as previously seen. Disc levels: No disc level pathology. No disc herniation. No compressive stenosis of the canal or foramina. IMPRESSION: This study confirms the presence of metastatic disease within the right side and central portion of the L1 vertebral body. Tumor extends into the posterior elements on the  right. Small amount of extraosseous tumor along the lateral margin of the vertebral body and along the ventral epidural space to the right of midline, only 1 or 2 mm thick, without encroachment upon the neural structures at this time. No second lesion seen in the region of the scan. Electronically Signed   By: MNelson ChimesM.D.   On: 07/02/2022 21:05   CT Abdomen Pelvis W Contrast  Result Date: 06/28/2022 CLINICAL DATA:  History of colon cancer. Restaging. Status post Y-90 radioembolization for right lobe of liver  metastasis. * Tracking Code: BO * EXAM: CT ABDOMEN AND PELVIS WITH CONTRAST TECHNIQUE: Multidetector CT imaging of the abdomen and pelvis was performed using the standard protocol following bolus administration of intravenous contrast. RADIATION DOSE REDUCTION: This exam was performed according to the departmental dose-optimization program which includes automated exposure control, adjustment of the mA and/or kV according to patient size and/or use of iterative reconstruction technique. CONTRAST:  170m OMNIPAQUE IOHEXOL 300 MG/ML  SOLN COMPARISON:  MRI abdomen 06/08/2022 and PET-CT from 10/30/2021 FINDINGS: Lower chest: Trace right pleural effusion. No suspicious lung nodules identified. Hepatobiliary: Confluent low-attenuation mass within the posterior right lobe of liver corresponding to treated metastasis is again noted. This measures 9.1 by 7.0 by 7.9 cm (volume = 260 cm^3), image 16/2 and coronal image 36/5. On the previous exam this measured 9.4 x 7.1 by 7.5 cm (volume = 260 cm^3) Adjacent, smaller lesion within the posteromedial right hepatic lobe measures 2.6 x 1.9 cm, image 23/2. Previously 2.2 x 1.4 cm. Small simple appearing cyst is again seen within segment 6 measuring 1.7 cm, image 41/2. No new liver lesions. Gallstones. Mild pericholecystic edema is nonspecific in the setting of liver disease. No bile duct dilatation. Pancreas: Unremarkable. No pancreatic ductal dilatation or surrounding  inflammatory changes. Spleen: Cystic lesion within the spleen measures 1.8 cm, image 10/2. Unchanged from previous exam. Normal size spleen. Adrenals/Urinary Tract: Normal adrenal glands. No nephrolithiasis, hydronephrosis or kidney mass. Bladder appears decompressed. Stomach/Bowel: Stomach is within normal limits. Concentric narrowing of the ascending colon corresponding to known malignancy is again noted and appears similar to previous exam measuring 2.1 x 2.3 by 2.0 cm, image 51/2. there is a moderate amount of retained stool identified within the cecum proximal to this lesion. The small bowel loops are prominent measuring up to 2.7 cm without pathologic dilatation. Vascular/Lymphatic: Normal appearance of the abdominal aorta. Patent upper abdominal vascularity. Enlarged portacaval lymph node measures 1.7 cm short axis, image 23/2. Unchanged from previous exam. Aortocaval lymph node measures 1.4 cm short axis, image 43/2. Previously 1.3 cm. Right ileocolic lymph node measures 0.9 cm, image 47/2. Unchanged from previous exam. No pelvic or inguinal adenopathy. Reproductive: Uterus and bilateral adnexa are unremarkable. Other: Small volume of free fluid noted within the pelvis, image 78/2. No peritoneal nodules identified. Musculoskeletal: Within the right-side of the L1 vertebral body there is a new peripherally sclerotic lesion measuring 2.2 cm, image 25/2. No acute osseous findings. IMPRESSION: 1. Allowing for differences in technique there has been no significant change in volume of tumor within the right hepatic lobe. The dominant lesion is stable in volume. There is a small adjacent lesion which is slightly increased in size in the interval. 2. Similar size of portacaval and retroperitoneal nodal metastasis. 3. New peripherally sclerotic lesion is identified within the right-side of the L1 vertebral body, suspicious for osseous metastatic disease. 4. Similar appearance of concentric lesion involving the mid  ascending colon. Electronically Signed   By: TKerby MoorsM.D.   On: 06/28/2022 17:15   IR Radiologist Eval & Mgmt  Result Date: 06/25/2022 EXAM: ESTABLISHED PATIENT OFFICE VISIT CHIEF COMPLAINT: See Epic note. HISTORY OF PRESENT ILLNESS: See Epic note. REVIEW OF SYSTEMS: See Epic note. PHYSICAL EXAMINATION: See Epic note. ASSESSMENT AND PLAN: See Epic note. DRuthann Cancer MD [facility] Electronically Signed   By: DRuthann CancerM.D.   On: 06/25/2022 14:12   MR LIVER W WO CONTRAST  Result Date: 06/08/2022 CLINICAL DATA:  Metastatic colon cancer status post Y90 in November EXAM: MRI ABDOMEN WITHOUT AND WITH CONTRAST TECHNIQUE: Multiplanar multisequence MR imaging of the abdomen was performed both before and after the administration of intravenous contrast. CONTRAST:  86m GADAVIST GADOBUTROL 1 MMOL/ML IV SOLN COMPARISON:  MR abdomen dated 12/15/2021 FINDINGS: Lower chest: No acute findings. Hepatobiliary: Again seen is predominantly necrotic mass centered within segment 7, measuring 9.6 x 7.1 cm in conglomerate (19: 26), previously 8.9 x 7.6 cm (remeasured). Compared to prior examination, there is interval enlargement of the peripheral enhancing component along the medial aspect of the mass measuring 6.0 x 2.7 cm (19:28). Multiple additional hepatic lesions are not substantially changed and consistent with no bile duct dilation. Cholelithiasis. Pancreas: No mass, inflammatory changes, or other parenchymal abnormality identified. Spleen: Similar lobulated superior splenic cyst. Mildly enlarged in the AP dimension measuring 13.4 cm. Adrenals/Urinary Tract: No adrenal nodules. No suspicious renal masses identified. No evidence of hydronephrosis. Unchanged bilateral subcentimeter foci, too small to characterize, but likely cysts. Stomach/Bowel: Persistent narrowing and enhancement in the mid ascending colon correspond to site of known malignancy.  Vascular/Lymphatic: Decreased size of 1.6 cm periportal lymph node (14:39), previously 2.1 cm. No abdominal aortic aneurysm demonstrated. Other:  None. Musculoskeletal: No suspicious bone lesions identified. IMPRESSION: 1. Increased size of predominantly necrotic mass centered within hepatic segment 7, with interval enlargement of the peripheral enhancing component along the medial aspect of the mass. 2. Persistent narrowing and enhancement in the mid ascending colon correspond to site of known malignancy. 3. Decreased size of 1.6 cm periportal lymph node, previously 2.1 cm. Electronically Signed   By: LDarrin NipperM.D.   On: 06/08/2022 14:03   DG Abd 2 Views  Result Date: 06/07/2022 CLINICAL DATA:  Abdominal pain. EXAM: ABDOMEN - 2 VIEW COMPARISON:  None Available. FINDINGS: The bowel gas pattern is normal without gaseous distention. No radio-opaque calculi or other significant radiographic abnormality are seen. Increased stool noted consistent with constipation. IMPRESSION: Constipation. Electronically Signed   By: JSammie BenchM.D.   On: 06/07/2022 16:24      IMPRESSION: {diagnosis}  ***  Today, I talked to the patient and family about the findings and work-up thus far.  We discussed the natural history of *** and general treatment, highlighting the role of radiotherapy in the management.  We discussed the available radiation techniques, and focused on the details of logistics and delivery.  We reviewed the anticipated acute and late sequelae associated with radiation in this setting.  The patient was encouraged to ask questions that I answered to the best of my ability. *** A patient consent form was discussed and signed.  We retained a copy for our records.  The patient would like to proceed with radiation and will be scheduled for CT simulation.  PLAN: ***    *** minutes of total time was spent for this patient encounter, including preparation, face-to-face counseling with the patient and  coordination of care, physical exam, and documentation of the encounter.   ------------------------------------------------  JBlair Promise PhD, MD  This document serves as a record of services personally performed by JGery Pray MD. It was created on his behalf by ERoney Mans a trained medical scribe. The creation of this record is based on the scribe's personal observations and the provider's statements to them. This document has been checked and approved by the attending provider.

## 2022-07-09 ENCOUNTER — Other Ambulatory Visit: Payer: Self-pay

## 2022-07-09 ENCOUNTER — Encounter: Payer: Self-pay | Admitting: Radiation Oncology

## 2022-07-09 ENCOUNTER — Ambulatory Visit
Admission: RE | Admit: 2022-07-09 | Discharge: 2022-07-09 | Disposition: A | Discharge status: Home / Self Care | Payer: Medicaid Other | Source: Ambulatory Visit | Attending: Radiation Oncology | Admitting: Radiation Oncology

## 2022-07-09 ENCOUNTER — Other Ambulatory Visit (HOSPITAL_COMMUNITY): Payer: Self-pay

## 2022-07-09 VITALS — BP 129/87 | HR 100 | Temp 98.0°F | Resp 18 | Ht 61.0 in | Wt 184.8 lb

## 2022-07-09 DIAGNOSIS — C7951 Secondary malignant neoplasm of bone: Secondary | ICD-10-CM | POA: Diagnosis present

## 2022-07-09 DIAGNOSIS — C787 Secondary malignant neoplasm of liver and intrahepatic bile duct: Secondary | ICD-10-CM | POA: Insufficient documentation

## 2022-07-09 DIAGNOSIS — C182 Malignant neoplasm of ascending colon: Secondary | ICD-10-CM | POA: Insufficient documentation

## 2022-07-09 DIAGNOSIS — Z79899 Other long term (current) drug therapy: Secondary | ICD-10-CM | POA: Diagnosis not present

## 2022-07-09 DIAGNOSIS — Z800 Family history of malignant neoplasm of digestive organs: Secondary | ICD-10-CM | POA: Diagnosis not present

## 2022-07-09 DIAGNOSIS — Z9221 Personal history of antineoplastic chemotherapy: Secondary | ICD-10-CM | POA: Diagnosis not present

## 2022-07-10 ENCOUNTER — Ambulatory Visit: Payer: Medicaid Other | Admitting: Hematology & Oncology

## 2022-07-10 ENCOUNTER — Other Ambulatory Visit: Payer: Medicaid Other

## 2022-07-10 ENCOUNTER — Other Ambulatory Visit: Payer: Self-pay

## 2022-07-10 ENCOUNTER — Inpatient Hospital Stay: Payer: Medicaid Other

## 2022-07-10 ENCOUNTER — Ambulatory Visit: Payer: Medicaid Other

## 2022-07-10 ENCOUNTER — Ambulatory Visit
Admission: RE | Admit: 2022-07-10 | Discharge: 2022-07-10 | Disposition: A | Discharge status: Home / Self Care | Payer: Medicaid Other | Source: Ambulatory Visit | Attending: Radiation Oncology | Admitting: Radiation Oncology

## 2022-07-10 DIAGNOSIS — Z5112 Encounter for antineoplastic immunotherapy: Secondary | ICD-10-CM | POA: Diagnosis not present

## 2022-07-10 DIAGNOSIS — C7951 Secondary malignant neoplasm of bone: Secondary | ICD-10-CM

## 2022-07-11 ENCOUNTER — Telehealth: Payer: Self-pay

## 2022-07-11 ENCOUNTER — Other Ambulatory Visit: Payer: Self-pay

## 2022-07-11 ENCOUNTER — Other Ambulatory Visit (HOSPITAL_BASED_OUTPATIENT_CLINIC_OR_DEPARTMENT_OTHER): Payer: Self-pay

## 2022-07-11 ENCOUNTER — Inpatient Hospital Stay: Payer: Medicaid Other

## 2022-07-11 VITALS — BP 131/80 | HR 84 | Temp 97.5°F | Resp 20

## 2022-07-11 DIAGNOSIS — Z5112 Encounter for antineoplastic immunotherapy: Secondary | ICD-10-CM | POA: Diagnosis not present

## 2022-07-11 DIAGNOSIS — C189 Malignant neoplasm of colon, unspecified: Secondary | ICD-10-CM

## 2022-07-11 MED ORDER — HEPARIN SOD (PORK) LOCK FLUSH 100 UNIT/ML IV SOLN
500.0000 [IU] | Freq: Once | INTRAVENOUS | Status: AC
  Administered 2022-07-11: 500 [IU] via INTRAVENOUS

## 2022-07-11 MED ORDER — SODIUM CHLORIDE 0.9% FLUSH
10.0000 mL | INTRAVENOUS | Status: DC | PRN
  Administered 2022-07-11: 10 mL via INTRAVENOUS

## 2022-07-11 MED ORDER — SODIUM CHLORIDE 0.9 % IV SOLN: INTRAVENOUS | Status: DC

## 2022-07-11 MED ORDER — PROCHLORPERAZINE MALEATE 10 MG PO TABS
10.0000 mg | ORAL_TABLET | Freq: Four times a day (QID) | ORAL | 0 refills | Status: DC | PRN
  Filled 2022-07-11: qty 60, 15d supply, fill #0

## 2022-07-11 MED ORDER — ONDANSETRON HCL 8 MG PO TABS
8.0000 mg | ORAL_TABLET | Freq: Two times a day (BID) | ORAL | 0 refills | Status: DC
  Filled 2022-07-11: qty 30, 15d supply, fill #0

## 2022-07-11 NOTE — Patient Instructions (Signed)
Dehydration, Adult Dehydration is condition in which there is not enough water or other fluids in the body. This happens when a person loses more fluids than he or she takes in. Important body parts cannot work right without the right amount of fluids. Any loss of fluids from the body can cause dehydration. Dehydration can be mild, worse, or very bad. It should be treated right away to keep it from getting very bad. What are the causes? This condition may be caused by: Conditions that cause loss of water or other fluids, such as: Watery poop (diarrhea). Vomiting. Sweating a lot. Peeing (urinating) a lot. Not drinking enough fluids, especially when you: Are ill. Are doing things that take a lot of energy to do. Other illnesses and conditions, such as fever or infection. Certain medicines, such as medicines that take extra fluid out of the body (diuretics). Lack of safe drinking water. Not being able to get enough water and food. What increases the risk? The following factors may make you more likely to develop this condition: Having a long-term (chronic) illness that has not been treated the right way, such as: Diabetes. Heart disease. Kidney disease. Being 65 years of age or older. Having a disability. Living in a place that is high above the ground or sea (high in altitude). The thinner, dried air causes more fluid loss. Doing exercises that put stress on your body for a long time. What are the signs or symptoms? Symptoms of dehydration depend on how bad it is. Mild or worse dehydration Thirst. Dry lips or dry mouth. Feeling dizzy or light-headed, especially when you stand up from sitting. Muscle cramps. Your body making: Dark pee (urine). Pee may be the color of tea. Less pee than normal. Less tears than normal. Headache. Very bad dehydration Changes in skin. Skin may: Be cold to the touch (clammy). Be blotchy or pale. Not go back to normal right after you lightly pinch  it and let it go. Little or no tears, pee, or sweat. Changes in vital signs, such as: Fast breathing. Low blood pressure. Weak pulse. Pulse that is more than 100 beats a minute when you are sitting still. Other changes, such as: Feeling very thirsty. Eyes that look hollow (sunken). Cold hands and feet. Being mixed up (confused). Being very tired (lethargic) or having trouble waking from sleep. Short-term weight loss. Loss of consciousness. How is this treated? Treatment for this condition depends on how bad it is. Treatment should start right away. Do not wait until your condition gets very bad. Very bad dehydration is an emergency. You will need to go to a hospital. Mild or worse dehydration can be treated at home. You may be asked to: Drink more fluids. Drink an oral rehydration solution (ORS). This drink helps get the right amounts of fluids and salts and minerals in the blood (electrolytes). Very bad dehydration can be treated: With fluids through an IV tube. By getting normal levels of salts and minerals in your blood. This is often done by giving salts and minerals through a tube. The tube is passed through your nose and into your stomach. By treating the root cause. Follow these instructions at home: Oral rehydration solution If told by your doctor, drink an ORS: Make an ORS. Use instructions on the package. Start by drinking small amounts, about  cup (120 mL) every 5-10 minutes. Slowly drink more until you have had the amount that your doctor said to have. Eating and drinking          Drink enough clear fluid to keep your pee pale yellow. If you were told to drink an ORS, finish the ORS first. Then, start slowly drinking other clear fluids. Drink fluids such as: Water. Do not drink only water. Doing that can make the salt (sodium) level in your body get too low. Water from ice chips you suck on. Fruit juice that you have added water to (diluted). Low-calorie sports  drinks. Eat foods that have the right amounts of salts and minerals, such as: Bananas. Oranges. Potatoes. Tomatoes. Spinach. Do not drink alcohol. Avoid: Drinks that have a lot of sugar. These include: High-calorie sports drinks. Fruit juice that you did not add water to. Soda. Caffeine. Foods that are greasy or have a lot of fat or sugar. General instructions Take over-the-counter and prescription medicines only as told by your doctor. Do not take salt tablets. Doing that can make the salt level in your body get too high. Return to your normal activities as told by your doctor. Ask your doctor what activities are safe for you. Keep all follow-up visits as told by your doctor. This is important. Contact a doctor if: You have pain in your belly (abdomen) and the pain: Gets worse. Stays in one place. You have a rash. You have a stiff neck. You get angry or annoyed (irritable) more easily than normal. You are more tired or have a harder time waking than normal. You feel: Weak or dizzy. Very thirsty. Get help right away if you have: Any symptoms of very bad dehydration. Symptoms of vomiting, such as: You cannot eat or drink without vomiting. Your vomiting gets worse or does not go away. Your vomit has blood or green stuff in it. Symptoms that get worse with treatment. A fever. A very bad headache. Problems with peeing or pooping (having a bowel movement), such as: Watery poop that gets worse or does not go away. Blood in your poop (stool). This may cause poop to look black and tarry. Not peeing in 6-8 hours. Peeing only a small amount of very dark pee in 6-8 hours. Trouble breathing. These symptoms may be an emergency. Do not wait to see if the symptoms will go away. Get medical help right away. Call your local emergency services (911 in the [place] Do not drive yourself to the hospital. Summary Dehydration is a condition in which there is not enough water or other fluids  in the body. This happens when a person loses more fluids than he or she takes in. Treatment for this condition depends on how bad it is. Treatment should be started right away. Do not wait until your condition gets very bad. Drink enough clear fluid to keep your pee pale yellow. If you were told to drink an oral rehydration solution (ORS), finish the ORS first. Then, start slowly drinking other clear fluids. Take over-the-counter and prescription medicines only as told by your doctor. Get help right away if you have any symptoms of very bad dehydration. This information is not intended to replace advice given to you by your health care provider. Make sure you discuss any questions you have with your health care provider. Document Revised: 09/20/2021 Document Reviewed: 12/25/2018 Elsevier Patient Education  2023 [employer] Inc.  

## 2022-07-11 NOTE — Telephone Encounter (Signed)
Received call from pt with reports of nausea & lightheadedness since beginning Lonsurf. Pt denies having anti-emetics at home. Noted that MD prescribed Zyprexa 06/27/2022. Pt reports she decided not to take this drug d/t reading about potential liver side effects.  Per Dr [name], scripts sent for Zofran and Compazine. Educated patient on utilizing these as "long acting" & "breakthrough" meds. Pt verbalizes understanding using teachback. Offered IVF to pt who declines at this time, but will contact our office if nausea worsens or if po intake decreases. dph

## 2022-07-12 ENCOUNTER — Ambulatory Visit
Admission: RE | Admit: 2022-07-12 | Discharge: 2022-07-12 | Disposition: A | Discharge status: Home / Self Care | Payer: Medicaid Other | Source: Ambulatory Visit | Attending: Radiation Oncology | Admitting: Radiation Oncology

## 2022-07-12 ENCOUNTER — Other Ambulatory Visit: Payer: Self-pay

## 2022-07-12 DIAGNOSIS — Z5112 Encounter for antineoplastic immunotherapy: Secondary | ICD-10-CM | POA: Diagnosis not present

## 2022-07-12 LAB — RAD ONC ARIA SESSION SUMMARY
Course Elapsed Days: 0
Plan Fractions Treated to Date: 1
Plan Prescribed Dose Per Fraction: 2.5 Gy
Plan Total Fractions Prescribed: 10
Plan Total Prescribed Dose: 25 Gy
Reference Point Dosage Given to Date: 2.5 Gy
Reference Point Session Dosage Given: 2.5 Gy
Session Number: 1

## 2022-07-13 ENCOUNTER — Ambulatory Visit
Admission: RE | Admit: 2022-07-13 | Discharge: 2022-07-13 | Disposition: A | Discharge status: Home / Self Care | Payer: Medicaid Other | Source: Ambulatory Visit | Attending: Radiation Oncology | Admitting: Radiation Oncology

## 2022-07-13 ENCOUNTER — Other Ambulatory Visit: Payer: Self-pay

## 2022-07-13 DIAGNOSIS — Z5112 Encounter for antineoplastic immunotherapy: Secondary | ICD-10-CM | POA: Diagnosis not present

## 2022-07-13 LAB — RAD ONC ARIA SESSION SUMMARY
Course Elapsed Days: 1
Plan Fractions Treated to Date: 2
Plan Prescribed Dose Per Fraction: 2.5 Gy
Plan Total Fractions Prescribed: 10
Plan Total Prescribed Dose: 25 Gy
Reference Point Dosage Given to Date: 5 Gy
Reference Point Session Dosage Given: 2.5 Gy
Session Number: 2

## 2022-07-16 ENCOUNTER — Ambulatory Visit
Admission: RE | Admit: 2022-07-16 | Discharge: 2022-07-16 | Disposition: A | Discharge status: Home / Self Care | Payer: Medicaid Other | Source: Ambulatory Visit | Attending: Radiation Oncology | Admitting: Radiation Oncology

## 2022-07-16 ENCOUNTER — Other Ambulatory Visit: Payer: Self-pay

## 2022-07-16 DIAGNOSIS — Z5112 Encounter for antineoplastic immunotherapy: Secondary | ICD-10-CM | POA: Diagnosis not present

## 2022-07-16 LAB — RAD ONC ARIA SESSION SUMMARY
Course Elapsed Days: 4
Plan Fractions Treated to Date: 3
Plan Prescribed Dose Per Fraction: 2.5 Gy
Plan Total Fractions Prescribed: 10
Plan Total Prescribed Dose: 25 Gy
Reference Point Dosage Given to Date: 7.5 Gy
Reference Point Session Dosage Given: 2.5 Gy
Session Number: 3

## 2022-07-17 ENCOUNTER — Ambulatory Visit
Admission: RE | Admit: 2022-07-17 | Discharge: 2022-07-17 | Disposition: A | Discharge status: Home / Self Care | Payer: Medicaid Other | Source: Ambulatory Visit | Attending: Radiation Oncology | Admitting: Radiation Oncology

## 2022-07-17 ENCOUNTER — Other Ambulatory Visit: Payer: Self-pay

## 2022-07-17 ENCOUNTER — Encounter: Payer: Self-pay | Admitting: *Deleted

## 2022-07-17 ENCOUNTER — Inpatient Hospital Stay: Payer: Medicaid Other

## 2022-07-17 ENCOUNTER — Encounter: Payer: Self-pay | Admitting: Hematology & Oncology

## 2022-07-17 ENCOUNTER — Inpatient Hospital Stay (HOSPITAL_BASED_OUTPATIENT_CLINIC_OR_DEPARTMENT_OTHER): Payer: Medicaid Other | Admitting: Hematology & Oncology

## 2022-07-17 VITALS — BP 126/68 | HR 84 | Resp 18

## 2022-07-17 DIAGNOSIS — Z5112 Encounter for antineoplastic immunotherapy: Secondary | ICD-10-CM | POA: Diagnosis not present

## 2022-07-17 DIAGNOSIS — C787 Secondary malignant neoplasm of liver and intrahepatic bile duct: Secondary | ICD-10-CM | POA: Diagnosis not present

## 2022-07-17 DIAGNOSIS — D649 Anemia, unspecified: Secondary | ICD-10-CM

## 2022-07-17 DIAGNOSIS — C189 Malignant neoplasm of colon, unspecified: Secondary | ICD-10-CM

## 2022-07-17 DIAGNOSIS — D509 Iron deficiency anemia, unspecified: Secondary | ICD-10-CM

## 2022-07-17 LAB — RAD ONC ARIA SESSION SUMMARY
Course Elapsed Days: 5
Plan Fractions Treated to Date: 4
Plan Prescribed Dose Per Fraction: 2.5 Gy
Plan Total Fractions Prescribed: 10
Plan Total Prescribed Dose: 25 Gy
Reference Point Dosage Given to Date: 10 Gy
Reference Point Session Dosage Given: 2.5 Gy
Session Number: 4

## 2022-07-17 LAB — FERRITIN: Ferritin: 115 ng/mL (ref 11–307)

## 2022-07-17 LAB — CBC WITH DIFFERENTIAL (CANCER CENTER ONLY)
Abs Immature Granulocytes: 0.01 10*3/uL (ref 0.00–0.07)
Basophils Absolute: 0 10*3/uL (ref 0.0–0.1)
Basophils Relative: 0 %
Eosinophils Absolute: 0.2 10*3/uL (ref 0.0–0.5)
Eosinophils Relative: 5 %
HCT: 35.1 % — ABNORMAL LOW (ref 36.0–46.0)
Hemoglobin: 11.2 g/dL — ABNORMAL LOW (ref 12.0–15.0)
Immature Granulocytes: 0 %
Lymphocytes Relative: 24 %
Lymphs Abs: 0.8 10*3/uL (ref 0.7–4.0)
MCH: 29.9 pg (ref 26.0–34.0)
MCHC: 31.9 g/dL (ref 30.0–36.0)
MCV: 93.9 fL (ref 80.0–100.0)
Monocytes Absolute: 0.2 10*3/uL (ref 0.1–1.0)
Monocytes Relative: 7 %
Neutro Abs: 2.1 10*3/uL (ref 1.7–7.7)
Neutrophils Relative %: 64 %
Platelet Count: 173 10*3/uL (ref 150–400)
RBC: 3.74 MIL/uL — ABNORMAL LOW (ref 3.87–5.11)
RDW: 14.9 % (ref 11.5–15.5)
WBC Count: 3.3 10*3/uL — ABNORMAL LOW (ref 4.0–10.5)
nRBC: 0 % (ref 0.0–0.2)

## 2022-07-17 LAB — CMP (CANCER CENTER ONLY)
ALT: 16 U/L (ref 0–44)
AST: 29 U/L (ref 15–41)
Albumin: 3.2 g/dL — ABNORMAL LOW (ref 3.5–5.0)
Alkaline Phosphatase: 181 U/L — ABNORMAL HIGH (ref 38–126)
Anion gap: 4 — ABNORMAL LOW (ref 5–15)
BUN: 8 mg/dL (ref 6–20)
CO2: 24 mmol/L (ref 22–32)
Calcium: 8 mg/dL — ABNORMAL LOW (ref 8.9–10.3)
Chloride: 108 mmol/L (ref 98–111)
Creatinine: 0.49 mg/dL (ref 0.44–1.00)
GFR, Estimated: >60 mL/min (ref >60)
Glucose, Bld: 96 mg/dL (ref 70–99)
Potassium: 3.5 mmol/L (ref 3.5–5.1)
Sodium: 136 mmol/L (ref 135–145)
Total Bilirubin: 0.6 mg/dL (ref 0.3–1.2)
Total Protein: 6.6 g/dL (ref 6.5–8.1)

## 2022-07-17 LAB — LACTATE DEHYDROGENASE: LDH: 286 U/L — ABNORMAL HIGH (ref 98–192)

## 2022-07-17 LAB — IRON AND IRON BINDING CAPACITY (CC-WL,HP ONLY)
Iron: 81 ug/dL (ref 28–170)
Saturation Ratios: 27 % (ref 10.4–31.8)
TIBC: 302 ug/dL (ref 250–450)
UIBC: 221 ug/dL (ref 148–442)

## 2022-07-17 LAB — TOTAL PROTEIN, URINE DIPSTICK: Protein, ur: NEGATIVE mg/dL

## 2022-07-17 LAB — CEA (IN HOUSE-CHCC): CEA (CHCC-In House): 10.57 ng/mL — ABNORMAL HIGH (ref 0.00–5.00)

## 2022-07-17 MED ORDER — SODIUM CHLORIDE 0.9 % IV SOLN
125.0000 mg | Freq: Once | INTRAVENOUS | Status: AC
  Administered 2022-07-17: 125 mg via INTRAVENOUS
  Filled 2022-07-17: qty 10

## 2022-07-17 MED ORDER — SODIUM CHLORIDE 0.9% FLUSH
10.0000 mL | Freq: Once | INTRAVENOUS | Status: AC
  Administered 2022-07-17: 10 mL

## 2022-07-17 MED ORDER — HEPARIN SOD (PORK) LOCK FLUSH 100 UNIT/ML IV SOLN
500.0000 [IU] | Freq: Once | INTRAVENOUS | Status: AC | PRN
  Administered 2022-07-17: 500 [IU]

## 2022-07-17 MED ORDER — SODIUM CHLORIDE 0.9 % IV SOLN: Freq: Once | INTRAVENOUS | Status: AC

## 2022-07-17 MED ORDER — SODIUM CHLORIDE 0.9 % IV SOLN
7.5000 mg/kg | Freq: Once | INTRAVENOUS | Status: AC
  Administered 2022-07-17: 600 mg via INTRAVENOUS
  Filled 2022-07-17: qty 24

## 2022-07-17 MED ORDER — SODIUM CHLORIDE 0.9% FLUSH
10.0000 mL | INTRAVENOUS | Status: DC | PRN
  Administered 2022-07-17: 10 mL

## 2022-07-17 NOTE — Progress Notes (Signed)
Hematology and Oncology Follow Up Visit  Rebecca Bullock [id number] April 29, 1966 57 y.o. 07/17/2022   Principle Diagnosis:  Metastatic colorectal cancer-liver metastasis- BRAF (+)   Current Therapy:        Status post cycle 1 of chemotherapy with FOLFOXIRI/Avastin Encorafenib/Vectibix -- started on 06/19/2021, s/p cycle #6 -- d/c on 10/31/2021 Intrahepatic TACE -- 03/29/2022 FOLFOXIRI/Avastin-- s/p cycle  #5-- start on 02/19/2022, -oxaliplatin dropped on 04/03/2022 -- d/c on 07/03/2022 IV iron-Ferrlecit given on 05/10/2022  Lonsurf/Avastin -- start cycle #1 on 07/07/2022 Zometa 4 mg IV every 3 months -next dose on 09/2022  XRT to L1 vertebral body --start on 07/10/2022               Interim History:  Rebecca Bullock is here today for follow-up.  She is doing radiation therapy to her lumbar spine right now.  She is doing well with this.  She still has pain.  Unfortunately, she cannot tolerate the Duragesic patch.  She is on oxycodone which does seem to help.  She also is on Lonsurf.  She is doing well with the Lonsurf.  She has a little bit of nausea with the Lonsurf.  She has had no problems with diarrhea.  She has had no issues with her urine.  There is no cough or shortness of breath.  She has had no leg swelling.  There is been no rashes.  She has had no bleeding.  Her last iron studies that were done couple weeks ago showed a ferritin of 176 and iron saturation of 14%.  Appetite is okay.  She recently has moved into a new apartment.  Currently, I would have said that her performance status is probably ECOG 1.    Medications:  Allergies as of 07/17/2022       Reactions   Augmentin [amoxicillin-pot Clavulanate] Diarrhea   Extreme diarrhea, abdominal pain.   Irinotecan Other (See Comments)   Flushing, tingling, visual disturbance   Metronidazole Diarrhea   Vancomycin Rash   ?red syndrome   Tape Rash        Medication List        Accurate as of July 17, 2022  8:59 AM. If  you have any questions, ask your nurse or doctor.          Acetaminophen 325 MG Caps Take 650 mg by mouth every 8 (eight) hours as needed for moderate pain or headache.   betamethasone valerate 0.1 % cream Commonly known as: VALISONE Apply 1 application  topically See admin instructions. Apply 1 application topically every other day as needed for psoriasis flare   cyclobenzaprine 10 MG tablet Commonly known as: FLEXERIL Take 1 tablet (10 mg total) by mouth 3 (three) times daily as needed for muscle spasms.   diphenoxylate-atropine 2.5-0.025 MG tablet Commonly known as: LOMOTIL Take 2 tablets by mouth 4 (four) times daily as needed for diarrhea or loose stools.   estradiol 0.1 MG/GM vaginal cream Commonly known as: ESTRACE Place 1 Applicatorful vaginally daily as needed (dryness / irritation).   famotidine 40 MG tablet Commonly known as: PEPCID Take 1 tablet (40 mg total) by mouth 2 (two) times daily.   fentaNYL 25 MCG/HR Commonly known as: [place] 1 patch onto the skin every 3 (three) days.   hydrocortisone 1 % lotion Apply 1 application topically 2 (two) times daily as needed for itching.   Lonsurf 20-8.19 MG tablet Generic drug: trifluridine-tipiracil Take 3 tablets (60 mg of trifluridine total) by mouth 2 (two) times  daily after a meal. Take within 1 hr after AM & PM meals on days 1-5, 8-12. Repeat every 28 days.   ondansetron 8 MG tablet Commonly known as: ZOFRAN Take 1 tablet (8 mg total) by mouth 2 (two) times daily.   Oxycodone HCl 10 MG Tabs Take 1 tablet (10 mg total) by mouth every 6 (six) hours as needed for severe pain.   prochlorperazine 10 MG tablet Commonly known as: COMPAZINE Take 1 tablet (10 mg total) by mouth every 6 (six) hours as needed for nausea or vomiting.   traMADol 50 MG tablet Commonly known as: ULTRAM Take 1 tablet (50 mg total) by mouth every 6 (six) hours as needed.        Allergies:  Allergies  Allergen Reactions    Augmentin [Amoxicillin-Pot Clavulanate] Diarrhea    Extreme diarrhea, abdominal pain.   Irinotecan Other (See Comments)    Flushing, tingling, visual disturbance   Metronidazole Diarrhea   Vancomycin Rash    ?red syndrome   Tape Rash    Past Medical History, Surgical history, Social history, and Family History were reviewed and updated.  Review of Systems: Review of Systems  Constitutional: Negative.   HENT: Negative.    Eyes: Negative.   Respiratory: Negative.    Cardiovascular: Negative.   Gastrointestinal: Negative.        Constipation alternating with diarrhea.   Genitourinary: Negative.   Musculoskeletal: Negative.   Skin: Negative.   Neurological: Negative.   Endo/Heme/Allergies: Negative.   Psychiatric/Behavioral: Negative.       Physical Exam: Her vital signs show temperature of 97.7.  Pulse 91.  Blood pressure 110/72.  Weight is 188 pounds.    Wt Readings from Last 3 Encounters:  07/17/22 188 lb (85.3 kg)  07/09/22 184 lb 12.8 oz (83.8 kg)  07/03/22 190 lb (86.2 kg)    Physical Exam Vitals reviewed.  HENT:     Head: Normocephalic and atraumatic.  Eyes:     Pupils: Pupils are equal, round, and reactive to light.  Cardiovascular:     Rate and Rhythm: Normal rate and regular rhythm.     Heart sounds: Normal heart sounds.  Pulmonary:     Effort: Pulmonary effort is normal.     Breath sounds: Normal breath sounds.  Abdominal:     General: Bowel sounds are normal.     Palpations: Abdomen is soft.  Musculoskeletal:        General: No tenderness or deformity. Normal range of motion.     Cervical back: Normal range of motion.  Lymphadenopathy:     Cervical: No cervical adenopathy.  Skin:    General: Skin is warm and dry.     Findings: No erythema or rash.  Neurological:     Mental Status: She is alert and oriented to person, place, and time.  Psychiatric:        Behavior: Behavior normal.        Thought Content: Thought content normal.         Judgment: Judgment normal.     Lab Results  Component Value Date   WBC 3.3 (L) 07/17/2022   HGB 11.2 (L) 07/17/2022   HCT 35.1 (L) 07/17/2022   MCV 93.9 07/17/2022   PLT 173 07/17/2022   Lab Results  Component Value Date   FERRITIN 176 07/03/2022   IRON 42 07/03/2022   TIBC 311 07/03/2022   UIBC 269 07/03/2022   IRONPCTSAT 14 07/03/2022   Lab Results  Component Value Date  RETICCTPCT 1.4 07/03/2022   RBC 3.74 (L) 07/17/2022   No results found for: "KPAFRELGTCHN", "LAMBDASER", "KAPLAMBRATIO" No results found for: "IGGSERUM", "IGA", "IGMSERUM" No results found for: "TOTALPROTELP", "ALBUMINELP", "A1GS", "A2GS", "BETS", "BETA2SER", "GAMS", "MSPIKE", "SPEI"   Chemistry      Component Value Date/Time   NA 136 07/17/2022 0803   K 3.5 07/17/2022 0803   CL 108 07/17/2022 0803   CO2 24 07/17/2022 0803   BUN 8 07/17/2022 0803   CREATININE 0.49 07/17/2022 0803   CREATININE 0.56 03/23/2021 0000      Component Value Date/Time   CALCIUM 8.0 (L) 07/17/2022 0803   ALKPHOS 181 (H) 07/17/2022 0803   AST 29 07/17/2022 0803   ALT 16 07/17/2022 0803   BILITOT 0.6 07/17/2022 0803       Impression and Plan: Rebecca Bullock is a very charming  57 yo caucasian female with metastatic colon cancer.  She had disease confined to her liver.  However, she then began to have progression.  She had disease in her L1 vertebral body.  She is now getting radiation therapy to this.  She is on Lonsurf now.  Hopefully, she will respond to the Lonsurf/Avastin combination.  We will have to see what her CEA level is.  Her last CEA level was 11.4.  We will go ahead with her Avastin today.  I will plan to get her back to see me in another 3 weeks.    [name], MD 2/20/20248:59 AM

## 2022-07-17 NOTE — Progress Notes (Signed)
Patient receiving new treatment due to progression. She has been on Lonsurf for about a week. She's had some nausea. She is also receiving radiation.   Oncology Nurse Navigator Documentation     07/17/2022   10:30 AM  Oncology Nurse Navigator Flowsheets  Navigator Follow Up Date: 08/07/2022  Navigator Follow Up Reason: Follow-up Appointment;Chemotherapy  Navigator Location [facility]  Navigator Encounter Type Appt/Treatment Plan Review  Patient Visit Type MedOnc  Treatment Phase Active Tx  Barriers/Navigation Needs No Barriers At This Time  Interventions Psycho-Social Support  Acuity Level 1-No Barriers  Support Groups/Services Friends and Family  Time Spent with Patient 15

## 2022-07-17 NOTE — Patient Instructions (Signed)
[facility]  Discharge Instructions: Thank you for choosing [facility] to provide your oncology and hematology care.   If you have a lab appointment with the [facility], please go directly to the [facility] and check in at the registration area.  Wear comfortable clothing and clothing appropriate for easy access to any Portacath or PICC line.   We strive to give you quality time with your provider. You may need to reschedule your appointment if you arrive late (15 or more minutes).  Arriving late affects you and other patients whose appointments are after yours.  Also, if you miss three or more appointments without notifying the office, you may be dismissed from the clinic at the provider's discretion.      For prescription refill requests, have your pharmacy contact our office and allow 72 hours for refills to be completed.    Today you received the following chemotherapy and/or immunotherapy agents:  Vegzelma      To help prevent nausea and vomiting after your treatment, we encourage you to take your nausea medication as directed.  BELOW ARE SYMPTOMS THAT SHOULD BE REPORTED IMMEDIATELY: *FEVER GREATER THAN 100.4 F (38 C) OR HIGHER *CHILLS OR SWEATING *NAUSEA AND VOMITING THAT IS NOT CONTROLLED WITH YOUR NAUSEA MEDICATION *UNUSUAL SHORTNESS OF BREATH *UNUSUAL BRUISING OR BLEEDING *URINARY PROBLEMS (pain or burning when urinating, or frequent urination) *BOWEL PROBLEMS (unusual diarrhea, constipation, pain near the anus) TENDERNESS IN MOUTH AND THROAT WITH OR WITHOUT PRESENCE OF ULCERS (sore throat, sores in mouth, or a toothache) UNUSUAL RASH, SWELLING OR PAIN  UNUSUAL VAGINAL DISCHARGE OR ITCHING   Items with * indicate a potential emergency and should be followed up as soon as possible or go to the Emergency Department if any problems should occur.  Please show the CHEMOTHERAPY ALERT CARD or IMMUNOTHERAPY ALERT CARD at  check-in to the Emergency Department and triage nurse. Should you have questions after your visit or need to cancel or reschedule your appointment, please contact [facility]  [phone] and follow the prompts.  Office hours are 8:00 a.m. to 4:30 p.m. Monday - Friday. Please note that voicemails left after 4:00 p.m. may not be returned until the following business day.  We are closed weekends and major holidays. You have access to a nurse at all times for urgent questions. Please call the main number to the clinic [phone] and follow the prompts.  For any non-urgent questions, you may also contact your provider using MyChart. We now offer e-Visits for anyone 87 and older to request care online for non-urgent symptoms. For details visit [email or web address].   Also download the MyChart app! Go to the app store, search "MyChart", open the app, select [facility], and log in with your MyChart username and password.

## 2022-07-17 NOTE — Patient Instructions (Signed)

## 2022-07-18 ENCOUNTER — Other Ambulatory Visit: Payer: Self-pay

## 2022-07-18 ENCOUNTER — Ambulatory Visit
Admission: RE | Admit: 2022-07-18 | Discharge: 2022-07-18 | Disposition: A | Discharge status: Home / Self Care | Payer: Medicaid Other | Source: Ambulatory Visit | Attending: Radiation Oncology | Admitting: Radiation Oncology

## 2022-07-18 ENCOUNTER — Other Ambulatory Visit (HOSPITAL_COMMUNITY): Payer: Self-pay

## 2022-07-18 ENCOUNTER — Telehealth: Payer: Self-pay | Admitting: *Deleted

## 2022-07-18 DIAGNOSIS — Z5112 Encounter for antineoplastic immunotherapy: Secondary | ICD-10-CM | POA: Diagnosis not present

## 2022-07-18 LAB — RAD ONC ARIA SESSION SUMMARY
Course Elapsed Days: 6
Plan Fractions Treated to Date: 5
Plan Prescribed Dose Per Fraction: 2.5 Gy
Plan Total Fractions Prescribed: 10
Plan Total Prescribed Dose: 25 Gy
Reference Point Dosage Given to Date: 12.5 Gy
Reference Point Session Dosage Given: 2.5 Gy
Session Number: 5

## 2022-07-18 NOTE — Telephone Encounter (Signed)
-----   Message from [name], MD sent at 07/17/2022  7:45 PM EST ----- Call - the CEA level is a little lower!!!! [place] [employer]!!  [name]

## 2022-07-18 NOTE — Telephone Encounter (Signed)
Called pt with results."the CEA level is a little lower!!!! [place] job!! Rebecca Bullock " Pt verbalized understanding. No concerns at this time.

## 2022-07-19 ENCOUNTER — Ambulatory Visit
Admission: RE | Admit: 2022-07-19 | Discharge: 2022-07-19 | Disposition: A | Discharge status: Home / Self Care | Payer: Medicaid Other | Source: Ambulatory Visit | Attending: Radiation Oncology | Admitting: Radiation Oncology

## 2022-07-19 ENCOUNTER — Other Ambulatory Visit: Payer: Self-pay

## 2022-07-19 DIAGNOSIS — Z5112 Encounter for antineoplastic immunotherapy: Secondary | ICD-10-CM | POA: Diagnosis not present

## 2022-07-19 LAB — RAD ONC ARIA SESSION SUMMARY
Course Elapsed Days: 7
Plan Fractions Treated to Date: 6
Plan Prescribed Dose Per Fraction: 2.5 Gy
Plan Total Fractions Prescribed: 10
Plan Total Prescribed Dose: 25 Gy
Reference Point Dosage Given to Date: 15 Gy
Reference Point Session Dosage Given: 2.5 Gy
Session Number: 6

## 2022-07-20 ENCOUNTER — Ambulatory Visit: Payer: Medicaid Other

## 2022-07-20 ENCOUNTER — Telehealth: Payer: Self-pay | Admitting: *Deleted

## 2022-07-20 ENCOUNTER — Other Ambulatory Visit: Payer: Self-pay | Admitting: *Deleted

## 2022-07-20 ENCOUNTER — Other Ambulatory Visit: Payer: Medicaid Other

## 2022-07-20 DIAGNOSIS — C189 Malignant neoplasm of colon, unspecified: Secondary | ICD-10-CM

## 2022-07-20 NOTE — Telephone Encounter (Signed)
Patient called.  Unable to come to appointment for labs/fluids and XR due to excessive diarrhea.  Reviewed Lomotil instructions with patient and to call office or on call physician,  Patient acknowledges instructions

## 2022-07-20 NOTE — Telephone Encounter (Signed)
Received a call from patient complaining of abd pain, N&V, constipation x 5 days then watery diarrhea.  States that abd is fairly firm.  Dr [name] notified.  Ordered XR of Abd, labs and IVF in our office.  Patient notified.  Patient will come in to our office as soon as she gets ready.

## 2022-07-21 ENCOUNTER — Other Ambulatory Visit: Payer: Self-pay

## 2022-07-23 ENCOUNTER — Other Ambulatory Visit: Payer: Self-pay

## 2022-07-23 ENCOUNTER — Ambulatory Visit
Admission: RE | Admit: 2022-07-23 | Discharge: 2022-07-23 | Disposition: A | Discharge status: Home / Self Care | Payer: Medicaid Other | Source: Ambulatory Visit | Attending: Radiation Oncology | Admitting: Radiation Oncology

## 2022-07-23 DIAGNOSIS — Z5112 Encounter for antineoplastic immunotherapy: Secondary | ICD-10-CM | POA: Diagnosis not present

## 2022-07-23 LAB — RAD ONC ARIA SESSION SUMMARY
Course Elapsed Days: 11
Plan Fractions Treated to Date: 7
Plan Prescribed Dose Per Fraction: 2.5 Gy
Plan Total Fractions Prescribed: 10
Plan Total Prescribed Dose: 25 Gy
Reference Point Dosage Given to Date: 17.5 Gy
Reference Point Session Dosage Given: 2.5 Gy
Session Number: 7

## 2022-07-24 ENCOUNTER — Other Ambulatory Visit (HOSPITAL_COMMUNITY): Payer: Self-pay

## 2022-07-24 ENCOUNTER — Other Ambulatory Visit: Payer: Self-pay

## 2022-07-24 ENCOUNTER — Ambulatory Visit
Admission: RE | Admit: 2022-07-24 | Discharge: 2022-07-24 | Disposition: A | Discharge status: Home / Self Care | Payer: Medicaid Other | Source: Ambulatory Visit | Attending: Radiation Oncology | Admitting: Radiation Oncology

## 2022-07-24 DIAGNOSIS — Z5112 Encounter for antineoplastic immunotherapy: Secondary | ICD-10-CM | POA: Diagnosis not present

## 2022-07-24 LAB — RAD ONC ARIA SESSION SUMMARY
Course Elapsed Days: 12
Plan Fractions Treated to Date: 8
Plan Prescribed Dose Per Fraction: 2.5 Gy
Plan Total Fractions Prescribed: 10
Plan Total Prescribed Dose: 25 Gy
Reference Point Dosage Given to Date: 20 Gy
Reference Point Session Dosage Given: 2.5 Gy
Session Number: 8

## 2022-07-25 ENCOUNTER — Other Ambulatory Visit (HOSPITAL_COMMUNITY): Payer: Self-pay

## 2022-07-25 ENCOUNTER — Ambulatory Visit
Admission: RE | Admit: 2022-07-25 | Discharge: 2022-07-25 | Disposition: A | Discharge status: Home / Self Care | Payer: Medicaid Other | Source: Ambulatory Visit | Attending: Radiation Oncology | Admitting: Radiation Oncology

## 2022-07-25 ENCOUNTER — Other Ambulatory Visit: Payer: Self-pay

## 2022-07-25 ENCOUNTER — Ambulatory Visit: Payer: Medicaid Other

## 2022-07-25 DIAGNOSIS — Z5112 Encounter for antineoplastic immunotherapy: Secondary | ICD-10-CM | POA: Diagnosis not present

## 2022-07-25 LAB — RAD ONC ARIA SESSION SUMMARY
Course Elapsed Days: 13
Plan Fractions Treated to Date: 9
Plan Prescribed Dose Per Fraction: 2.5 Gy
Plan Total Fractions Prescribed: 10
Plan Total Prescribed Dose: 25 Gy
Reference Point Dosage Given to Date: 22.5 Gy
Reference Point Session Dosage Given: 2.5 Gy
Session Number: 9

## 2022-07-26 ENCOUNTER — Encounter: Payer: Self-pay | Admitting: Hematology & Oncology

## 2022-07-26 ENCOUNTER — Inpatient Hospital Stay: Payer: Medicaid Other

## 2022-07-26 ENCOUNTER — Other Ambulatory Visit: Payer: Self-pay | Admitting: *Deleted

## 2022-07-26 ENCOUNTER — Other Ambulatory Visit (HOSPITAL_COMMUNITY): Payer: Self-pay

## 2022-07-26 ENCOUNTER — Other Ambulatory Visit (HOSPITAL_BASED_OUTPATIENT_CLINIC_OR_DEPARTMENT_OTHER): Payer: Self-pay

## 2022-07-26 ENCOUNTER — Ambulatory Visit: Payer: Medicaid Other

## 2022-07-26 ENCOUNTER — Ambulatory Visit (HOSPITAL_BASED_OUTPATIENT_CLINIC_OR_DEPARTMENT_OTHER)
Admission: RE | Admit: 2022-07-26 | Discharge: 2022-07-26 | Disposition: A | Discharge status: Home / Self Care | Payer: Medicaid Other | Source: Ambulatory Visit | Attending: Hematology & Oncology | Admitting: Hematology & Oncology

## 2022-07-26 ENCOUNTER — Inpatient Hospital Stay (HOSPITAL_BASED_OUTPATIENT_CLINIC_OR_DEPARTMENT_OTHER): Payer: Medicaid Other | Admitting: Hematology & Oncology

## 2022-07-26 ENCOUNTER — Telehealth: Payer: Self-pay | Admitting: *Deleted

## 2022-07-26 VITALS — BP 116/80 | HR 100 | Temp 97.4°F | Resp 24 | Ht 61.0 in | Wt 178.3 lb

## 2022-07-26 DIAGNOSIS — C189 Malignant neoplasm of colon, unspecified: Secondary | ICD-10-CM

## 2022-07-26 DIAGNOSIS — C787 Secondary malignant neoplasm of liver and intrahepatic bile duct: Secondary | ICD-10-CM

## 2022-07-26 DIAGNOSIS — D649 Anemia, unspecified: Secondary | ICD-10-CM

## 2022-07-26 DIAGNOSIS — R110 Nausea: Secondary | ICD-10-CM

## 2022-07-26 DIAGNOSIS — D509 Iron deficiency anemia, unspecified: Secondary | ICD-10-CM

## 2022-07-26 LAB — CBC WITH DIFFERENTIAL (CANCER CENTER ONLY)
Abs Immature Granulocytes: 0.02 10*3/uL (ref 0.00–0.07)
Basophils Absolute: 0 10*3/uL (ref 0.0–0.1)
Basophils Relative: 0 %
Eosinophils Absolute: 0.1 10*3/uL (ref 0.0–0.5)
Eosinophils Relative: 1 %
HCT: 44.6 % (ref 36.0–46.0)
Hemoglobin: 14.6 g/dL (ref 12.0–15.0)
Immature Granulocytes: 1 %
Lymphocytes Relative: 16 %
Lymphs Abs: 0.6 10*3/uL — ABNORMAL LOW (ref 0.7–4.0)
MCH: 30 pg (ref 26.0–34.0)
MCHC: 32.7 g/dL (ref 30.0–36.0)
MCV: 91.8 fL (ref 80.0–100.0)
Monocytes Absolute: 0.3 10*3/uL (ref 0.1–1.0)
Monocytes Relative: 8 %
Neutro Abs: 2.9 10*3/uL (ref 1.7–7.7)
Neutrophils Relative %: 74 %
Platelet Count: 225 10*3/uL (ref 150–400)
RBC: 4.86 MIL/uL (ref 3.87–5.11)
RDW: 16.4 % — ABNORMAL HIGH (ref 11.5–15.5)
WBC Count: 4 10*3/uL (ref 4.0–10.5)
nRBC: 0 % (ref 0.0–0.2)

## 2022-07-26 LAB — CMP (CANCER CENTER ONLY)
ALT: 18 U/L (ref 0–44)
AST: 27 U/L (ref 15–41)
Albumin: 4.7 g/dL (ref 3.5–5.0)
Alkaline Phosphatase: 255 U/L — ABNORMAL HIGH (ref 38–126)
Anion gap: 15 (ref 5–15)
BUN: 13 mg/dL (ref 6–20)
CO2: 24 mmol/L (ref 22–32)
Calcium: 9.8 mg/dL (ref 8.9–10.3)
Chloride: 101 mmol/L (ref 98–111)
Creatinine: 0.65 mg/dL (ref 0.44–1.00)
GFR, Estimated: >60 mL/min (ref >60)
Glucose, Bld: 127 mg/dL — ABNORMAL HIGH (ref 70–99)
Potassium: 3.8 mmol/L (ref 3.5–5.1)
Sodium: 140 mmol/L (ref 135–145)
Total Bilirubin: 0.6 mg/dL (ref 0.3–1.2)
Total Protein: 7.8 g/dL (ref 6.5–8.1)

## 2022-07-26 LAB — MAGNESIUM: Magnesium: 2.2 mg/dL (ref 1.7–2.4)

## 2022-07-26 MED ORDER — LORAZEPAM 2 MG/ML IJ SOLN
0.2500 mg | Freq: Once | INTRAMUSCULAR | Status: AC
  Administered 2022-07-26: 0.25 mg via INTRAVENOUS
  Filled 2022-07-26: qty 1

## 2022-07-26 MED ORDER — ONDANSETRON HCL 4 MG/2ML IJ SOLN
8.0000 mg | Freq: Once | INTRAMUSCULAR | Status: AC
  Administered 2022-07-26: 8 mg via INTRAVENOUS
  Filled 2022-07-26: qty 4

## 2022-07-26 MED ORDER — HYDROMORPHONE HCL 1 MG/ML IJ SOLN
1.0000 mg | Freq: Once | INTRAMUSCULAR | Status: AC
  Administered 2022-07-26: 1 mg via INTRAVENOUS
  Filled 2022-07-26: qty 1

## 2022-07-26 MED ORDER — SODIUM CHLORIDE 0.9 % IV SOLN: 8.0000 mg | Freq: Once | INTRAVENOUS | Status: DC

## 2022-07-26 MED ORDER — SODIUM CHLORIDE 0.9% FLUSH
10.0000 mL | Freq: Once | INTRAVENOUS | Status: AC
  Administered 2022-07-26: 10 mL

## 2022-07-26 MED ORDER — SODIUM CHLORIDE 0.9 % IV SOLN
10.0000 mg | Freq: Once | INTRAVENOUS | Status: AC
  Administered 2022-07-26: 10 mg via INTRAVENOUS
  Filled 2022-07-26: qty 10

## 2022-07-26 MED ORDER — SODIUM CHLORIDE 0.9 % IV SOLN: Freq: Once | INTRAVENOUS | Status: AC

## 2022-07-26 MED ORDER — KETOROLAC TROMETHAMINE 15 MG/ML IJ SOLN
30.0000 mg | Freq: Once | INTRAMUSCULAR | Status: AC
  Administered 2022-07-26: 30 mg via INTRAVENOUS
  Filled 2022-07-26: qty 2

## 2022-07-26 MED ORDER — SODIUM CHLORIDE 0.9 % IV SOLN: Freq: Once | INTRAVENOUS | Status: DC

## 2022-07-26 NOTE — Patient Instructions (Signed)

## 2022-07-26 NOTE — Patient Instructions (Signed)

## 2022-07-26 NOTE — Telephone Encounter (Signed)
Call received from patient stating that she started with increased abdominal pain and vomiting yesterday evening and wants to know if she can cancel her last XRT appt scheduled for today.  Dr. [name] notified.  Call placed back to patient and patient notified per order of Dr. [name] to come to the office now for labs/IVF's and office visit.  Rebecca Bullock did agree to coming in after much encouragement.  Message sent to scheduling and XRT notified.

## 2022-07-26 NOTE — Progress Notes (Signed)
Hematology and Oncology Follow Up Visit  Kandiss Holyoak [id number] 14-Jul-1965 57 y.o. 07/26/2022   Principle Diagnosis:  Metastatic colorectal cancer-liver metastasis- BRAF (+)   Current Therapy:        Status post cycle 1 of chemotherapy with FOLFOXIRI/Avastin Encorafenib/Vectibix -- started on 06/19/2021, s/p cycle #6 -- d/c on 10/31/2021 Intrahepatic TACE -- 03/29/2022 FOLFOXIRI/Avastin-- s/p cycle  #5-- start on 02/19/2022, -oxaliplatin dropped on 04/03/2022 -- d/c on 07/03/2022 IV iron-Ferrlecit given on 05/10/2022  Lonsurf/Avastin -- start cycle #1 on 07/07/2022 Zometa 4 mg IV every 3 months -next dose on 09/2022  XRT to L1 vertebral body --start on 07/10/2022               Interim History:  Ms. Lamarre is here today for an unscheduled visit.  She is having quite a bit difficulty at home.  She was having a lot of nausea and vomiting.  She is having back pain.  She has been getting radiation therapy to the L1 vertebral body.  She has 1 more treatment left.  She does not want to continue treatment because she is not sure it is really helping her.  She apparently is having bowel issues.  She was having diarrhea.  She took Imodium.  She did then has not had a bowel movement for about 5 days.  She has been throwing up.  She not be able to take medication because she has been getting sick.  She is off the Lonsurf right now.  She had her first cycle and now is on the 2-week break.  I am unsure if this might be causing some problems.  She has a hard time with pain medication.  We tried her on a Duragesic patch.  She really cannot tolerate this.  She also says that oxycodone does not help.  She clearly is dehydrated.  She is getting IV fluids in the office.  It is hard to say how much that radiation therapy is helping.  I told her that radiation a lot of times does not work ordered because her decrease in pain until after the radiation is finished.  Again, she really has had a hard time for  the past couple months.  She gets into these episodes where she has diarrhea.  She denies constipation.  She then has vomiting.  She then cannot eat or take pills.  For right now, I would say performance status is probably ECOG 2 at best.     Medications:  Allergies as of 07/26/2022       Reactions   Augmentin [amoxicillin-pot Clavulanate] Diarrhea   Extreme diarrhea, abdominal pain.   Irinotecan Other (See Comments)   Flushing, tingling, visual disturbance   Metronidazole Diarrhea   Vancomycin Rash   ?red syndrome   Tape Rash        Medication List        Accurate as of July 26, 2022  2:11 PM. If you have any questions, ask your nurse or doctor.          Acetaminophen 325 MG Caps Take 650 mg by mouth every 8 (eight) hours as needed for moderate pain or headache.   betamethasone valerate 0.1 % cream Commonly known as: VALISONE Apply 1 application  topically See admin instructions. Apply 1 application topically every other day as needed for psoriasis flare   cyclobenzaprine 10 MG tablet Commonly known as: FLEXERIL Take 1 tablet (10 mg total) by mouth 3 (three) times daily as needed for muscle spasms.  diphenoxylate-atropine 2.5-0.025 MG tablet Commonly known as: LOMOTIL Take 2 tablets by mouth 4 (four) times daily as needed for diarrhea or loose stools.   estradiol 0.1 MG/GM vaginal cream Commonly known as: ESTRACE Place 1 Applicatorful vaginally daily as needed (dryness / irritation).   famotidine 40 MG tablet Commonly known as: PEPCID Take 1 tablet (40 mg total) by mouth 2 (two) times daily.   fentaNYL 25 MCG/HR Commonly known as: [place] 1 patch onto the skin every 3 (three) days.   hydrocortisone 1 % lotion Apply 1 application topically 2 (two) times daily as needed for itching.   Lonsurf 20-8.19 MG tablet Generic drug: trifluridine-tipiracil Take 3 tablets (60 mg of trifluridine total) by mouth 2 (two) times daily after a meal. Take  within 1 hr after AM & PM meals on days 1-5, 8-12. Repeat every 28 days.   loperamide 2 MG tablet Commonly known as: IMODIUM A-D Take 2 mg by mouth 4 (four) times daily as needed for diarrhea or loose stools.   omeprazole 20 MG tablet Commonly known as: PRILOSEC OTC Take 20 mg by mouth 2 (two) times daily.   ondansetron 8 MG tablet Commonly known as: ZOFRAN Take 1 tablet (8 mg total) by mouth 2 (two) times daily.   Oxycodone HCl 10 MG Tabs Take 1 tablet (10 mg total) by mouth every 6 (six) hours as needed for severe pain.   prochlorperazine 10 MG tablet Commonly known as: COMPAZINE Take 1 tablet (10 mg total) by mouth every 6 (six) hours as needed for nausea or vomiting.   traMADol 50 MG tablet Commonly known as: ULTRAM Take 1 tablet (50 mg total) by mouth every 6 (six) hours as needed.        Allergies:  Allergies  Allergen Reactions   Augmentin [Amoxicillin-Pot Clavulanate] Diarrhea    Extreme diarrhea, abdominal pain.   Irinotecan Other (See Comments)    Flushing, tingling, visual disturbance   Metronidazole Diarrhea   Vancomycin Rash    ?red syndrome   Tape Rash    Past Medical History, Surgical history, Social history, and Family History were reviewed and updated.  Review of Systems: Review of Systems  Constitutional: Negative.   HENT: Negative.    Eyes: Negative.   Respiratory: Negative.    Cardiovascular: Negative.   Gastrointestinal: Negative.        Constipation alternating with diarrhea.   Genitourinary: Negative.   Musculoskeletal: Negative.   Skin: Negative.   Neurological: Negative.   Endo/Heme/Allergies: Negative.   Psychiatric/Behavioral: Negative.       Physical Exam: Her vital signs show temperature of 97.7.  Pulse 91.  Blood pressure 110/72.  Weight is 188 pounds.    Wt Readings from Last 3 Encounters:  07/26/22 178 lb 4.8 oz (80.9 kg)  07/17/22 188 lb (85.3 kg)  07/09/22 184 lb 12.8 oz (83.8 kg)    Physical Exam Vitals  reviewed.  HENT:     Head: Normocephalic and atraumatic.  Eyes:     Pupils: Pupils are equal, round, and reactive to light.  Cardiovascular:     Rate and Rhythm: Normal rate and regular rhythm.     Heart sounds: Normal heart sounds.  Pulmonary:     Effort: Pulmonary effort is normal.     Breath sounds: Normal breath sounds.  Abdominal:     General: Bowel sounds are normal.     Palpations: Abdomen is soft.  Musculoskeletal:        General: No tenderness or deformity.  Normal range of motion.     Cervical back: Normal range of motion.  Lymphadenopathy:     Cervical: No cervical adenopathy.  Skin:    General: Skin is warm and dry.     Findings: No erythema or rash.  Neurological:     Mental Status: She is alert and oriented to person, place, and time.  Psychiatric:        Behavior: Behavior normal.        Thought Content: Thought content normal.        Judgment: Judgment normal.     Lab Results  Component Value Date   WBC 4.0 07/26/2022   HGB 14.6 07/26/2022   HCT 44.6 07/26/2022   MCV 91.8 07/26/2022   PLT 225 07/26/2022   Lab Results  Component Value Date   FERRITIN 115 07/17/2022   IRON 81 07/17/2022   TIBC 302 07/17/2022   UIBC 221 07/17/2022   IRONPCTSAT 27 07/17/2022   Lab Results  Component Value Date   RETICCTPCT 1.4 07/03/2022   RBC 4.86 07/26/2022   No results found for: "KPAFRELGTCHN", "LAMBDASER", "KAPLAMBRATIO" No results found for: "IGGSERUM", "IGA", "IGMSERUM" No results found for: "TOTALPROTELP", "ALBUMINELP", "A1GS", "A2GS", "BETS", "BETA2SER", "GAMS", "MSPIKE", "SPEI"   Chemistry      Component Value Date/Time   NA 140 07/26/2022 1240   K 3.8 07/26/2022 1240   CL 101 07/26/2022 1240   CO2 24 07/26/2022 1240   BUN 13 07/26/2022 1240   CREATININE 0.65 07/26/2022 1240   CREATININE 0.56 03/23/2021 0000      Component Value Date/Time   CALCIUM 9.8 07/26/2022 1240   ALKPHOS 255 (H) 07/26/2022 1240   AST 27 07/26/2022 1240   ALT 18  07/26/2022 1240   BILITOT 0.6 07/26/2022 1240       Impression and Plan: Ms. Merten is a very charming  57 yo caucasian female with metastatic colon cancer.  She had disease confined to her liver.  However, she then began to have progression.  She had disease in her L1 vertebral body.  She is now getting radiation therapy to this.  We have her on Lonsurf.  Again, I just hate that she is miserable.  She really needs IV fluids.  She feels little bit better with the IV fluids.  We gave her some IV medication to try to also help her.  I think she is going need to have frequent IV fluids.  I would have her come back tomorrow.  I want to get a x-ray of her abdomen.  I want to see how this looks.  I really do not hear a lot of bowel sounds might listen to her.  I know she is trying her best.  I know she has a unique sensitivity to medications.  She really cannot help this.  Hopefully, we will be able to find a regimen of pain medication that we will help her so that she is not in discomfort.  I will like to get a x-ray of her lumbar spine tomorrow.  At least we can see how everything looks back there.  Ultimately, we may need another MRI to see if there is a fracture.  [name], MD 2/29/20242:11 PM

## 2022-07-27 ENCOUNTER — Other Ambulatory Visit (HOSPITAL_BASED_OUTPATIENT_CLINIC_OR_DEPARTMENT_OTHER): Payer: Self-pay

## 2022-07-27 ENCOUNTER — Ambulatory Visit (HOSPITAL_BASED_OUTPATIENT_CLINIC_OR_DEPARTMENT_OTHER)
Admission: RE | Admit: 2022-07-27 | Discharge: 2022-07-27 | Disposition: A | Discharge status: Home / Self Care | Payer: Medicaid Other | Source: Ambulatory Visit | Attending: Hematology & Oncology | Admitting: Hematology & Oncology

## 2022-07-27 ENCOUNTER — Telehealth: Payer: Self-pay | Admitting: *Deleted

## 2022-07-27 ENCOUNTER — Ambulatory Visit: Payer: Medicaid Other

## 2022-07-27 ENCOUNTER — Other Ambulatory Visit: Payer: Self-pay

## 2022-07-27 ENCOUNTER — Inpatient Hospital Stay: Payer: Medicaid Other | Attending: Hematology & Oncology

## 2022-07-27 ENCOUNTER — Other Ambulatory Visit: Payer: Self-pay | Admitting: *Deleted

## 2022-07-27 ENCOUNTER — Other Ambulatory Visit (HOSPITAL_COMMUNITY): Payer: Self-pay

## 2022-07-27 VITALS — BP 141/67 | HR 100 | Temp 98.0°F | Resp 18

## 2022-07-27 DIAGNOSIS — R0602 Shortness of breath: Secondary | ICD-10-CM

## 2022-07-27 DIAGNOSIS — C189 Malignant neoplasm of colon, unspecified: Secondary | ICD-10-CM | POA: Diagnosis present

## 2022-07-27 DIAGNOSIS — C787 Secondary malignant neoplasm of liver and intrahepatic bile duct: Secondary | ICD-10-CM

## 2022-07-27 DIAGNOSIS — R110 Nausea: Secondary | ICD-10-CM

## 2022-07-27 DIAGNOSIS — C182 Malignant neoplasm of ascending colon: Secondary | ICD-10-CM | POA: Diagnosis present

## 2022-07-27 DIAGNOSIS — D509 Iron deficiency anemia, unspecified: Secondary | ICD-10-CM

## 2022-07-27 DIAGNOSIS — Z5112 Encounter for antineoplastic immunotherapy: Secondary | ICD-10-CM | POA: Insufficient documentation

## 2022-07-27 MED ORDER — SODIUM CHLORIDE 0.9 % IV SOLN
10.0000 mg | Freq: Once | INTRAVENOUS | Status: AC
  Administered 2022-07-27: 10 mg via INTRAVENOUS
  Filled 2022-07-27: qty 10

## 2022-07-27 MED ORDER — HYDROMORPHONE HCL 2 MG PO TABS: 2.0000 mg | ORAL_TABLET | ORAL | 0 refills | Status: DC | PRN

## 2022-07-27 MED ORDER — KETOROLAC TROMETHAMINE 15 MG/ML IJ SOLN
30.0000 mg | Freq: Once | INTRAMUSCULAR | Status: AC
  Administered 2022-07-27: 30 mg via INTRAVENOUS
  Filled 2022-07-27: qty 2

## 2022-07-27 MED ORDER — SODIUM CHLORIDE 0.9 % IV SOLN: INTRAVENOUS | Status: DC

## 2022-07-27 MED ORDER — SODIUM CHLORIDE 0.9 % IV SOLN: Freq: Once | INTRAVENOUS | Status: AC

## 2022-07-27 NOTE — Patient Instructions (Signed)

## 2022-07-27 NOTE — Telephone Encounter (Signed)
Call placed to patient to notify her per order of Dr. [name] to go to the [facility] ER now d/t abd xray from yesterday afternoon shows a small bowel obstruction.  Pt states that she does not want to go to the ER and that she feels much better today, no pain, no n/v and diarrhea times 2 yesterday evening.  Dr. [name] notified and states that pt can come in today as scheduled for IVF's and an additional abd xray prior to coming up for IVF's.  Pt is agreeable to this plan and has no questions at this time.

## 2022-07-30 ENCOUNTER — Other Ambulatory Visit (HOSPITAL_COMMUNITY): Payer: Self-pay

## 2022-07-31 ENCOUNTER — Other Ambulatory Visit (HOSPITAL_COMMUNITY): Payer: Self-pay

## 2022-07-31 ENCOUNTER — Telehealth: Payer: Self-pay

## 2022-07-31 NOTE — Telephone Encounter (Signed)
Patient called stating she is now having diarrhea she had the bowel obstruction and had days of no bowel movement. Has had watery stools all morning no vomiting, discussed with Dr.Ennever. patient to take half of an imodium to slow down the diarrhea. Patient informed and will call back if she has any vomiting and or worsening of the diarrhea. She will also call this evening if she feels she needs fluids tomorrow.

## 2022-08-01 ENCOUNTER — Telehealth: Payer: Self-pay

## 2022-08-01 ENCOUNTER — Ambulatory Visit: Payer: Medicaid Other | Admitting: Dietician

## 2022-08-01 ENCOUNTER — Telehealth: Payer: Self-pay | Admitting: Dietician

## 2022-08-01 NOTE — Progress Notes (Signed)
Nutrition Assessment   Reason for Assessment: MST screen for weight loss.    ASSESSMENT: Patient s 57 year old female with metastatic colorectal cancer-liver metastasis- BRAF (+).  She is being treated with radiation for back pain and Bevacizumab Q 21 days.  She has been struggling with bowel obstruction and irregularities.  She had at least 5 episodes of diarrhea yesterday and used 1 Imodium.  She reports constipation is a concern with too much Imodium.  Diet is lots of fast food.  She lives alone and often eats out.  She loves burgers and fries, will occasionally eat chicken nuggets, likes salads in warmer weather.  Fluid intake poor, trying to drink more water, does use some sodas, doesn't like texture and chalkiness of ONS.    Anthropometrics: lost 10# (5.3%) less than 2 weeks.  Height: 61" Weight:  07/26/22  178.3 07/17/22  188# UBW: 185# BMI: 33.69  INTERVENTION:  Relayed that nutrition services are wrap around service provided at no charge and encouraged continued communication if experiencing continued weight loss or any nutritional impact symptoms (NIS). Reviewed strategies for bowel rest and diarrhea management.  Encouraged small frequent feeds and trying to eat 6 small meals. Suggested use of Greek yogurt as lean protein source. Encouraged cooked vegetables, soft soluble fibers, avoiding raw and skins and seeds. Discussed strategies for increasing fluids but avoiding caffeine and carbonation. Emailed Nutrition Tip sheet  for  managing diarrhea with contact information provided   MONITORING, EVALUATION, GOAL: weight, PO intake, Nutrition Impact Symptoms, labs Goal is weight maintenance  Next Visit: Remote next week to assess bowel regularity and weight.  April Manson, RDN, LDN Registered Dietitian, [place] Part Time Remote (Usual office hours: Tuesday-Thursday) Cell: [phone]

## 2022-08-01 NOTE — Telephone Encounter (Signed)
Received phone call from patient in regards to timing of her medication. Pt asking if she can take flexiril and dilaudid together. This RN recommended patient to take medications an hour apart to be able to see how tired or groggy she gets until she is used to taking them as she just received prescription for dilaudid this week. Pt verbalized understanding and asked if she could come in for fluids tomorrow. Scheduling message sent and patient aware of IVF appointment for 08/02/2022 at 1 pm. Pt had no further questions.

## 2022-08-01 NOTE — Telephone Encounter (Signed)
Called in response for MST for weight loss.  April Manson, RDN, LDN Registered Dietitian, [place] Part Time Remote (Usual office hours: Tuesday-Thursday) Mobile: [phone] Remote Office: [phone]

## 2022-08-02 ENCOUNTER — Other Ambulatory Visit (HOSPITAL_COMMUNITY): Payer: Self-pay

## 2022-08-02 ENCOUNTER — Inpatient Hospital Stay: Payer: Medicaid Other

## 2022-08-02 VITALS — BP 133/67 | HR 67 | Temp 97.5°F | Resp 17

## 2022-08-02 DIAGNOSIS — Z5112 Encounter for antineoplastic immunotherapy: Secondary | ICD-10-CM | POA: Diagnosis not present

## 2022-08-02 DIAGNOSIS — C189 Malignant neoplasm of colon, unspecified: Secondary | ICD-10-CM

## 2022-08-02 MED ORDER — SODIUM CHLORIDE 0.9 % IV SOLN: INTRAVENOUS | Status: DC

## 2022-08-02 MED ORDER — HYDROMORPHONE HCL 1 MG/ML IJ SOLN
1.0000 mg | Freq: Once | INTRAMUSCULAR | Status: AC
  Administered 2022-08-02: 1 mg via INTRAVENOUS
  Filled 2022-08-02: qty 1

## 2022-08-02 MED ORDER — SODIUM CHLORIDE 0.9% FLUSH
10.0000 mL | Freq: Once | INTRAVENOUS | Status: AC
  Administered 2022-08-02: 10 mL via INTRAVENOUS

## 2022-08-02 MED ORDER — HEPARIN SOD (PORK) LOCK FLUSH 100 UNIT/ML IV SOLN
500.0000 [IU] | Freq: Once | INTRAVENOUS | Status: AC
  Administered 2022-08-02: 500 [IU] via INTRAVENOUS

## 2022-08-02 NOTE — Patient Instructions (Signed)
Dehydration, Adult Dehydration is a condition in which there is not enough water or other fluids in the body. This happens when a person loses more fluids than they take in. Important organs cannot work right without the right amount of fluids. Any loss of fluids from the body can cause dehydration. Dehydration can be mild, worse, or very bad. It should be treated right away to keep it from getting very bad. What are the causes? Conditions that cause loss of water in the body. They include: Watery poop (diarrhea). Vomiting. Sweating a lot. Fever. Infection. Peeing (urinating) a lot. Not drinking enough fluids. Certain medicines, such as medicines that take extra fluid out of the body (diuretics). Lack of safe drinking water. Not being able to get enough water and food. What increases the risk? Having a long-term (chronic) illness that has not been treated the right way, such as: Diabetes. Heart disease. Kidney disease. Being 9 years of age or older. Having a disability. Living in a place that is high above the ground or sea (high in altitude). The thinner, drier air causes more fluid loss. Doing exercises that put stress on your body for a long time. Being active when in hot places. What are the signs or symptoms? Symptoms of dehydration depend on how bad it is. Mild or worse dehydration Thirst. Dry lips or dry mouth. Feeling dizzy or light-headed. Muscle cramps. Passing little pee or dark pee. Pee may be the color of tea. Headache. Very bad dehydration Changes in skin. Skin may: Be cold to the touch (clammy). Be blotchy or pale. Not go back to normal right after you pinch it and let it go. Little or no tears, pee, or sweat. Fast breathing. Low blood pressure. Weak pulse. Pulse that is more than 100 beats a minute when you are sitting still. Other changes, such as: Feeling very thirsty. Eyes that look hollow (sunken). Cold hands and feet. Being confused. Being very  tired (lethargic) or having trouble waking from sleep. Losing weight. Loss of consciousness. How is this treated? Treatment for this condition depends on how bad your dehydration is. Treatment should start right away. Do not wait until your condition gets very bad. Very bad dehydration is an emergency. You will need to go to a hospital. Mild or worse dehydration can be treated at home. You may be asked to: Drink more fluids. Drink an oral rehydration solution (ORS). This drink gives you the right amount of fluids, salts, and minerals (electrolytes). Very bad dehydration can be treated: With fluids through an IV tube. By correcting low levels of electrolytes in the body. By treating the problem that caused your dehydration. Follow these instructions at home: Oral rehydration solution If told by your doctor, drink an ORS: Make an ORS. Use instructions on the package. Start by drinking small amounts, about  cup (120 mL) every 5-10 minutes. Slowly drink more until you have had the amount that your doctor said to have.  Eating and drinking  Drink enough clear fluid to keep your pee pale yellow. If you were told to drink an ORS, finish the ORS first. Then, start slowly drinking other clear fluids. Drink fluids such as: Water. Do not drink only water. Doing that can make the salt (sodium) level in your body get too low. Water from ice chips you suck on. Fruit juice that you have added water to (diluted). Low-calorie sports drinks. Eat foods that have the right amounts of salts and minerals, such as bananas, oranges, potatoes,  tomatoes, or spinach. Do not drink alcohol. Avoid drinks that have caffeine or sugar. These include:: High-calorie sports drinks. Fruit juice that you did not add water to. Soda. Coffee or energy drinks. Avoid foods that are greasy or have a lot of fat or sugar. General instructions Take over-the-counter and prescription medicines only as told by your doctor. Do  not take sodium tablets. Doing that can make the salt level in your body get too high. Return to your normal activities as told by your doctor. Ask your doctor what activities are safe for you. Keep all follow-up visits. Your doctor may check and change your treatment. Contact a doctor if: You have pain in your belly (abdomen) and the pain: Gets worse. Stays in one place. You have a rash. You have a stiff neck. You get angry or annoyed more easily than normal. You are more tired or have a harder time waking than normal. You feel weak or dizzy. You feel very thirsty. Get help right away if: You have any symptoms of very bad dehydration. You vomit every time you eat or drink. Your vomiting gets worse, does not go away, or you vomit blood or green stuff. You are getting treatment, but symptoms are getting worse. You have a fever. You have a very bad headache. You have: Diarrhea that gets worse or does not go away. Blood in your poop (stool). This may cause poop to look black and tarry. No pee in 6-8 hours. Only a small amount of pee in 6-8 hours, and the pee is very dark. You have trouble breathing. These symptoms may be an emergency. Get help right away. Call 911. Do not wait to see if the symptoms will go away. Do not drive yourself to the hospital. This information is not intended to replace advice given to you by your health care provider. Make sure you discuss any questions you have with your health care provider. Document Revised: 12/11/2021 Document Reviewed: 12/11/2021 Elsevier Patient Education  [place].

## 2022-08-07 ENCOUNTER — Inpatient Hospital Stay: Payer: Medicaid Other

## 2022-08-07 ENCOUNTER — Inpatient Hospital Stay (HOSPITAL_BASED_OUTPATIENT_CLINIC_OR_DEPARTMENT_OTHER): Payer: Medicaid Other | Admitting: Hematology & Oncology

## 2022-08-07 ENCOUNTER — Encounter: Payer: Self-pay | Admitting: *Deleted

## 2022-08-07 ENCOUNTER — Encounter: Payer: Self-pay | Admitting: Hematology & Oncology

## 2022-08-07 ENCOUNTER — Other Ambulatory Visit: Payer: Self-pay

## 2022-08-07 ENCOUNTER — Other Ambulatory Visit (HOSPITAL_BASED_OUTPATIENT_CLINIC_OR_DEPARTMENT_OTHER): Payer: Self-pay

## 2022-08-07 ENCOUNTER — Encounter: Payer: Self-pay | Admitting: Family

## 2022-08-07 VITALS — BP 124/74 | HR 91 | Resp 18 | Ht 61.0 in | Wt 178.0 lb

## 2022-08-07 DIAGNOSIS — C787 Secondary malignant neoplasm of liver and intrahepatic bile duct: Secondary | ICD-10-CM

## 2022-08-07 DIAGNOSIS — C189 Malignant neoplasm of colon, unspecified: Secondary | ICD-10-CM | POA: Diagnosis not present

## 2022-08-07 DIAGNOSIS — Z5112 Encounter for antineoplastic immunotherapy: Secondary | ICD-10-CM | POA: Diagnosis not present

## 2022-08-07 LAB — CMP (CANCER CENTER ONLY)
ALT: 17 U/L (ref 0–44)
AST: 31 U/L (ref 15–41)
Albumin: 3.8 g/dL (ref 3.5–5.0)
Alkaline Phosphatase: 204 U/L — ABNORMAL HIGH (ref 38–126)
Anion gap: 9 (ref 5–15)
BUN: 5 mg/dL — ABNORMAL LOW (ref 6–20)
CO2: 28 mmol/L (ref 22–32)
Calcium: 9.3 mg/dL (ref 8.9–10.3)
Chloride: 103 mmol/L (ref 98–111)
Creatinine: 0.47 mg/dL (ref 0.44–1.00)
GFR, Estimated: >60 mL/min (ref >60)
Glucose, Bld: 93 mg/dL (ref 70–99)
Potassium: 3.3 mmol/L — ABNORMAL LOW (ref 3.5–5.1)
Sodium: 140 mmol/L (ref 135–145)
Total Bilirubin: 0.3 mg/dL (ref 0.3–1.2)
Total Protein: 6.9 g/dL (ref 6.5–8.1)

## 2022-08-07 LAB — RETICULOCYTES
Immature Retic Fract: 10.7 % (ref 2.3–15.9)
RBC.: 3.89 MIL/uL (ref 3.87–5.11)
Retic Count, Absolute: 62.2 10*3/uL (ref 19.0–186.0)
Retic Ct Pct: 1.6 % (ref 0.4–3.1)

## 2022-08-07 LAB — CBC WITH DIFFERENTIAL (CANCER CENTER ONLY)
Abs Immature Granulocytes: 0.01 10*3/uL (ref 0.00–0.07)
Basophils Absolute: 0 10*3/uL (ref 0.0–0.1)
Basophils Relative: 1 %
Eosinophils Absolute: 0.1 10*3/uL (ref 0.0–0.5)
Eosinophils Relative: 3 %
HCT: 36.6 % (ref 36.0–46.0)
Hemoglobin: 11.7 g/dL — ABNORMAL LOW (ref 12.0–15.0)
Immature Granulocytes: 1 %
Lymphocytes Relative: 26 %
Lymphs Abs: 0.6 10*3/uL — ABNORMAL LOW (ref 0.7–4.0)
MCH: 29.8 pg (ref 26.0–34.0)
MCHC: 32 g/dL (ref 30.0–36.0)
MCV: 93.1 fL (ref 80.0–100.0)
Monocytes Absolute: 0.4 10*3/uL (ref 0.1–1.0)
Monocytes Relative: 18 %
Neutro Abs: 1.2 10*3/uL — ABNORMAL LOW (ref 1.7–7.7)
Neutrophils Relative %: 51 %
Platelet Count: 136 10*3/uL — ABNORMAL LOW (ref 150–400)
RBC: 3.93 MIL/uL (ref 3.87–5.11)
RDW: 15.9 % — ABNORMAL HIGH (ref 11.5–15.5)
WBC Count: 2.2 10*3/uL — ABNORMAL LOW (ref 4.0–10.5)
nRBC: 0 % (ref 0.0–0.2)

## 2022-08-07 LAB — IRON AND IRON BINDING CAPACITY (CC-WL,HP ONLY)
Iron: 86 ug/dL (ref 28–170)
Saturation Ratios: 30 % (ref 10.4–31.8)
TIBC: 287 ug/dL (ref 250–450)
UIBC: 201 ug/dL (ref 148–442)

## 2022-08-07 LAB — MAGNESIUM: Magnesium: 1.7 mg/dL (ref 1.7–2.4)

## 2022-08-07 LAB — CEA (IN HOUSE-CHCC): CEA (CHCC-In House): 10.97 ng/mL — ABNORMAL HIGH (ref 0.00–5.00)

## 2022-08-07 LAB — FERRITIN: Ferritin: 243 ng/mL (ref 11–307)

## 2022-08-07 MED ORDER — SODIUM CHLORIDE 0.9% FLUSH
10.0000 mL | INTRAVENOUS | Status: DC | PRN
  Administered 2022-08-07: 10 mL

## 2022-08-07 MED ORDER — SODIUM CHLORIDE 0.9 % IV SOLN: Freq: Once | INTRAVENOUS | Status: AC

## 2022-08-07 MED ORDER — FAMOTIDINE 40 MG PO TABS
40.0000 mg | ORAL_TABLET | Freq: Two times a day (BID) | ORAL | 4 refills | Status: DC
  Filled 2022-08-07: qty 60, 30d supply, fill #0
  Filled 2022-09-25: qty 60, 30d supply, fill #1
  Filled 2022-11-14: qty 60, 30d supply, fill #2
  Filled 2022-12-17 (×2): qty 60, 30d supply, fill #3

## 2022-08-07 MED ORDER — SODIUM CHLORIDE 0.9 % IV SOLN
7.5000 mg/kg | Freq: Once | INTRAVENOUS | Status: AC
  Administered 2022-08-07: 600 mg via INTRAVENOUS
  Filled 2022-08-07: qty 24

## 2022-08-07 MED ORDER — HEPARIN SOD (PORK) LOCK FLUSH 100 UNIT/ML IV SOLN
500.0000 [IU] | Freq: Once | INTRAVENOUS | Status: AC | PRN
  Administered 2022-08-07: 500 [IU]

## 2022-08-07 NOTE — Patient Instructions (Signed)
[facility]  Discharge Instructions: Thank you for choosing [facility] to provide your oncology and hematology care.   If you have a lab appointment with the Cancer Center, please go directly to the Cancer Center and check in at the registration area.  Wear comfortable clothing and clothing appropriate for easy access to any Portacath or PICC line.   We strive to give you quality time with your provider. You may need to reschedule your appointment if you arrive late (15 or more minutes).  Arriving late affects you and other patients whose appointments are after yours.  Also, if you miss three or more appointments without notifying the office, you may be dismissed from the clinic at the provider's discretion.      For prescription refill requests, have your pharmacy contact our office and allow 72 hours for refills to be completed.    Today you received the following chemotherapy and/or immunotherapy agents Avastin     To help prevent nausea and vomiting after your treatment, we encourage you to take your nausea medication as directed.  BELOW ARE SYMPTOMS THAT SHOULD BE REPORTED IMMEDIATELY: *FEVER GREATER THAN 100.4 F (38 C) OR HIGHER *CHILLS OR SWEATING *NAUSEA AND VOMITING THAT IS NOT CONTROLLED WITH YOUR NAUSEA MEDICATION *UNUSUAL SHORTNESS OF BREATH *UNUSUAL BRUISING OR BLEEDING *URINARY PROBLEMS (pain or burning when urinating, or frequent urination) *BOWEL PROBLEMS (unusual diarrhea, constipation, pain near the anus) TENDERNESS IN MOUTH AND THROAT WITH OR WITHOUT PRESENCE OF ULCERS (sore throat, sores in mouth, or a toothache) UNUSUAL RASH, SWELLING OR PAIN  UNUSUAL VAGINAL DISCHARGE OR ITCHING   Items with * indicate a potential emergency and should be followed up as soon as possible or go to the Emergency Department if any problems should occur.  Please show the CHEMOTHERAPY ALERT CARD or IMMUNOTHERAPY ALERT CARD at check-in  to the Emergency Department and triage nurse. Should you have questions after your visit or need to cancel or reschedule your appointment, please contact [facility]  [phone] and follow the prompts.  Office hours are 8:00 a.m. to 4:30 p.m. Monday - Friday. Please note that voicemails left after 4:00 p.m. may not be returned until the following business day.  We are closed weekends and major holidays. You have access to a nurse at all times for urgent questions. Please call the main number to the clinic [phone] and follow the prompts.  For any non-urgent questions, you may also contact your provider using MyChart. We now offer e-Visits for anyone 18 and older to request care online for non-urgent symptoms. For details visit [email or web address].   Also download the MyChart app! Go to the app store, search "MyChart", open the app, select , and log in with your MyChart username and password.   

## 2022-08-07 NOTE — Radiation Completion Notes (Signed)
Patient Name: DYNEISHA, CULLIVER MRN: [id number] Date of Birth: 1965-07-24 Referring Physician: Burney Gauze, M.D. Date of Service: 2022-08-07 Radiation Oncologist: Teryl Lucy, M.D. [facility] END OF TREATMENT NOTE     Diagnosis: C79.51 Secondary malignant neoplasm of bone Staging on 2021-05-05: Colon cancer metastasized to liver ([place]) T=cT3, N=cN1b, M=pM1 Intent: Palliative     ==========DELIVERED PLANS==========  First Treatment Date: 2022-07-11 - Last Treatment Date: 2022-07-25   Plan Name: Spine_L Site: Lumbar Spine Technique: 3D Mode: Photon Dose Per Fraction: 2.5 Gy Prescribed Dose (Delivered / Prescribed): 22.5 Gy / 25 Gy Prescribed Fxs (Delivered / Prescribed): 9 / 10     ==========ON TREATMENT VISIT DATES========== 2022-07-17, 2022-07-24     ==========UPCOMING VISITS==========       ==========APPENDIX - ON TREATMENT VISIT NOTES==========   See weekly On Treatment Notes is Epic for details.

## 2022-08-07 NOTE — Progress Notes (Signed)
Ok to treat with ANC 1.2 per Dr [name]. dph

## 2022-08-07 NOTE — Progress Notes (Signed)
Hematology and Oncology Follow Up Visit  Rebecca Bullock NX:[id number] 1965/12/31 57 y.o. 08/07/2022   Principle Diagnosis:  Metastatic colorectal cancer-liver metastasis- BRAF (+)   Current Therapy:        Status post cycle 1 of chemotherapy with FOLFOXIRI/Avastin Encorafenib/Vectibix -- started on 06/19/2021, s/p cycle #6 -- d/c on 10/31/2021 Intrahepatic TACE -- 03/29/2022 FOLFOXIRI/Avastin-- s/p cycle  #5-- start on 02/19/2022, -oxaliplatin dropped on 04/03/2022 -- d/c on 07/03/2022 IV iron-Ferrlecit given on 05/10/2022  Lonsurf/Avastin -- s/p cycle #1 -- start on 07/07/2022 Zometa 4 mg IV every 3 months -next dose on 09/2022  XRT to L1 vertebral body --start on 07/10/2022               Interim History:  Rebecca Bullock is here today for follow-up.  Rebecca Bullock Rebecca Bullock is doing quite well right now.  Rebecca Bullock had little bit of diarrhea this morning.  However, I much rather Rebecca Bullock have diarrhea then have constipated and began to have a blockage.  Rebecca Bullock has finished Rebecca Bullock radiation therapy to the back.  There is still little bit of back discomfort.  Rebecca Bullock is on Lonsurf.  Rebecca Bullock has had 1 cycle to date.  We will have to see what Rebecca Bullock CEA level is.  Rebecca Bullock has had little bit of a cough.  It is a dry cough.  Rebecca Bullock has had no problems with urine.  Rebecca Bullock has had no dysuria or urinary frequency.  There is been no mouth sores.  Rebecca Bullock has had no headache.  There is been no leg swelling.  Rebecca Bullock has had no rashes.  This will occasional nausea.  Overall, I would have said that Rebecca Bullock performance status is probably ECOG 1.      Medications:  Allergies as of 08/07/2022       Reactions   Augmentin [amoxicillin-pot Clavulanate] Diarrhea   Extreme diarrhea, abdominal pain.   Irinotecan Other (See Comments)   Flushing, tingling, visual disturbance   Metronidazole Diarrhea, Other (See Comments)   Vancomycin Rash   ?red syndrome   Tape Rash        Medication List        Accurate as of August 07, 2022  8:16 AM. If you have any questions,  ask your nurse or doctor.          Acetaminophen 325 MG Caps Take 650 mg by mouth every 8 (eight) hours as needed for moderate pain or headache.   betamethasone valerate 0.1 % cream Commonly known as: VALISONE Apply 1 application  topically See admin instructions. Apply 1 application topically every other day as needed for psoriasis flare   cyclobenzaprine 10 MG tablet Commonly known as: FLEXERIL Take 1 tablet (10 mg total) by mouth 3 (three) times daily as needed for muscle spasms.   diphenoxylate-atropine 2.5-0.025 MG tablet Commonly known as: LOMOTIL Take 2 tablets by mouth 4 (four) times daily as needed for diarrhea or loose stools.   estradiol 0.1 MG/GM vaginal cream Commonly known as: ESTRACE Place 1 Applicatorful vaginally daily as needed (dryness / irritation).   famotidine 40 MG tablet Commonly known as: PEPCID Take 1 tablet (40 mg total) by mouth 2 (two) times daily.   fentaNYL 25 MCG/HR Commonly known as: [place] 1 patch onto the skin every 3 (three) days.   hydrocortisone 1 % lotion Apply 1 application topically 2 (two) times daily as needed for itching.   HYDROmorphone 2 MG tablet Commonly known as: Dilaudid Take 1 tablet (2 mg total) by mouth every 4 (  four) hours as needed for severe pain.   Lonsurf 20-8.19 MG tablet Generic drug: trifluridine-tipiracil Take 3 tablets (60 mg of trifluridine total) by mouth 2 (two) times daily after a meal. Take within 1 hr after AM & PM meals on days 1-5, 8-12. Repeat every 28 days.   loperamide 2 MG tablet Commonly known as: IMODIUM A-D Take 2 mg by mouth 4 (four) times daily as needed for diarrhea or loose stools.   omeprazole 20 MG tablet Commonly known as: PRILOSEC OTC Take 20 mg by mouth 2 (two) times daily.   ondansetron 8 MG tablet Commonly known as: ZOFRAN Take 1 tablet (8 mg total) by mouth 2 (two) times daily.   Oxycodone HCl 10 MG Tabs Take 1 tablet (10 mg total) by mouth every 6 (six) hours as  needed for severe pain.   prochlorperazine 10 MG tablet Commonly known as: COMPAZINE Take 1 tablet (10 mg total) by mouth every 6 (six) hours as needed for nausea or vomiting.   traMADol 50 MG tablet Commonly known as: ULTRAM Take 1 tablet (50 mg total) by mouth every 6 (six) hours as needed.        Allergies:  Allergies  Allergen Reactions   Augmentin [Amoxicillin-Pot Clavulanate] Diarrhea    Extreme diarrhea, abdominal pain.   Irinotecan Other (See Comments)    Flushing, tingling, visual disturbance   Metronidazole Diarrhea and Other (See Comments)   Vancomycin Rash    ?red syndrome   Tape Rash    Past Medical History, Surgical history, Social history, and Family History were reviewed and updated.  Review of Systems: Review of Systems  Constitutional: Negative.   HENT: Negative.    Eyes: Negative.   Respiratory: Negative.    Cardiovascular: Negative.   Gastrointestinal: Negative.        Constipation alternating with diarrhea.   Genitourinary: Negative.   Musculoskeletal: Negative.   Skin: Negative.   Neurological: Negative.   Endo/Heme/Allergies: Negative.   Psychiatric/Behavioral: Negative.       Physical Exam: Rebecca Bullock vital signs show temperature of 97.7.  Pulse 91.  Blood pressure 124/74.  Weight is 178 pounds.    Wt Readings from Last 3 Encounters:  08/07/22 178 lb (80.7 kg)  07/26/22 178 lb 4.8 oz (80.9 kg)  07/17/22 188 lb (85.3 kg)    Physical Exam Vitals reviewed.  HENT:     Head: Normocephalic and atraumatic.  Eyes:     Pupils: Pupils are equal, round, and reactive to light.  Cardiovascular:     Rate and Rhythm: Normal rate and regular rhythm.     Heart sounds: Normal heart sounds.  Pulmonary:     Effort: Pulmonary effort is normal.     Breath sounds: Normal breath sounds.  Abdominal:     General: Bowel sounds are normal.     Palpations: Abdomen is soft.  Musculoskeletal:        General: No tenderness or deformity. Normal range of motion.      Cervical back: Normal range of motion.  Lymphadenopathy:     Cervical: No cervical adenopathy.  Skin:    General: Skin is warm and dry.     Findings: No erythema or rash.  Neurological:     Mental Status: Rebecca Bullock is alert and oriented to person, place, and time.  Psychiatric:        Behavior: Behavior normal.        Thought Content: Thought content normal.        Judgment: Judgment  normal.      Lab Results  Component Value Date   WBC 4.0 07/26/2022   HGB 14.6 07/26/2022   HCT 44.6 07/26/2022   MCV 91.8 07/26/2022   PLT 225 07/26/2022   Lab Results  Component Value Date   FERRITIN 115 07/17/2022   IRON 81 07/17/2022   TIBC 302 07/17/2022   UIBC 221 07/17/2022   IRONPCTSAT 27 07/17/2022   Lab Results  Component Value Date   RETICCTPCT 1.4 07/03/2022   RBC 4.86 07/26/2022   No results found for: "KPAFRELGTCHN", "LAMBDASER", "KAPLAMBRATIO" No results found for: "IGGSERUM", "IGA", "IGMSERUM" No results found for: "TOTALPROTELP", "ALBUMINELP", "A1GS", "A2GS", "BETS", "BETA2SER", "GAMS", "MSPIKE", "SPEI"   Chemistry      Component Value Date/Time   NA 140 07/26/2022 1240   K 3.8 07/26/2022 1240   CL 101 07/26/2022 1240   CO2 24 07/26/2022 1240   BUN 13 07/26/2022 1240   CREATININE 0.65 07/26/2022 1240   CREATININE 0.56 03/23/2021 0000      Component Value Date/Time   CALCIUM 9.8 07/26/2022 1240   ALKPHOS 255 (H) 07/26/2022 1240   AST 27 07/26/2022 1240   ALT 18 07/26/2022 1240   BILITOT 0.6 07/26/2022 1240       Impression and Plan: Rebecca Bullock is a very charming  57 yo caucasian female with metastatic colon cancer.  Rebecca Bullock had disease confined to Rebecca Bullock liver.  However, Rebecca Bullock then began to have progression.  Rebecca Bullock had disease in Rebecca Bullock L1 vertebral body.  Rebecca Bullock has received radiation therapy to this.  We have Rebecca Bullock on Lonsurf.  Rebecca Bullock started Rebecca Bullock second cycle of Lonsurf.  Again, hopefully will see that the CEA is improving.  I am just glad that Rebecca Bullock is doing better with his back  to nausea and vomiting.  I know this has been challenging.  We did give Rebecca Bullock IV fluids on occasion.  This really has helped Rebecca Bullock.  For right now, we just need to keep close contact with Rebecca Bullock.  I know that Rebecca Bullock is doing Rebecca Bullock best.  I know Rebecca Bullock is doing everything that we have asked Rebecca Bullock to do.  I know that Rebecca Bullock is dealing with issues at home.  I told Rebecca Bullock to make sure that Rebecca Bullock calls [place] if Rebecca Bullock begins having problems at home.  We can always get Rebecca Bullock in so we can try to prevent things from escalating would expect to abdominal symptoms.  I would like to see Rebecca Bullock back in about 3 weeks.    [name], MD 3/12/20248:16 AM

## 2022-08-07 NOTE — Patient Instructions (Signed)

## 2022-08-07 NOTE — Progress Notes (Signed)
Patient continues on maintenance chemo. She hasn't had any navigational needs in some time. As such I will discontinue active navigation at this time, but be available to the patient in the future as needed.   Oncology Nurse Navigator Documentation     08/07/2022    9:00 AM  Oncology Nurse Navigator Flowsheets  Navigation Complete Date: 08/07/2022  Post Navigation: Continue to Follow Patient? No  Reason Not Navigating Patient: Patient On Maintenance Chemotherapy  Navigator Location [facility]  Navigator Encounter Type Appt/Treatment Plan Review  Patient Visit Type MedOnc  Treatment Phase Active Tx  Barriers/Navigation Needs No Barriers At This Time  Interventions None Required  Acuity Level 1-No Barriers  Support Groups/Services Friends and Family  Time Spent with Patient 15

## 2022-08-08 ENCOUNTER — Encounter: Payer: Self-pay | Admitting: Family

## 2022-08-08 ENCOUNTER — Encounter: Payer: Self-pay | Admitting: Hematology & Oncology

## 2022-08-08 ENCOUNTER — Telehealth: Payer: Self-pay | Admitting: Dietician

## 2022-08-08 ENCOUNTER — Encounter: Payer: Medicaid Other | Admitting: Dietician

## 2022-08-08 NOTE — Telephone Encounter (Signed)
Attempted to reach patient for a scheduled remote nutrition consult. Voice mail was full, provided my cell# in a text to return call for follow up nutrition consult.  April Manson, RDN, LDN Registered Dietitian, [place] Part Time Remote (Usual office hours: Tuesday-Thursday) Cell: [phone]

## 2022-08-13 ENCOUNTER — Other Ambulatory Visit (HOSPITAL_COMMUNITY): Payer: Self-pay

## 2022-08-13 ENCOUNTER — Other Ambulatory Visit: Payer: Self-pay

## 2022-08-13 ENCOUNTER — Inpatient Hospital Stay (HOSPITAL_BASED_OUTPATIENT_CLINIC_OR_DEPARTMENT_OTHER): Payer: Medicaid Other | Admitting: Hematology & Oncology

## 2022-08-13 ENCOUNTER — Telehealth: Payer: Self-pay

## 2022-08-13 ENCOUNTER — Inpatient Hospital Stay: Payer: Medicaid Other

## 2022-08-13 ENCOUNTER — Telehealth: Payer: Self-pay | Admitting: *Deleted

## 2022-08-13 ENCOUNTER — Telehealth: Payer: Self-pay | Admitting: Pharmacist

## 2022-08-13 ENCOUNTER — Encounter: Payer: Self-pay | Admitting: Hematology & Oncology

## 2022-08-13 VITALS — BP 124/80 | HR 99 | Temp 97.8°F | Resp 20 | Ht 61.0 in | Wt 174.0 lb

## 2022-08-13 DIAGNOSIS — R110 Nausea: Secondary | ICD-10-CM | POA: Diagnosis not present

## 2022-08-13 DIAGNOSIS — C189 Malignant neoplasm of colon, unspecified: Secondary | ICD-10-CM

## 2022-08-13 DIAGNOSIS — C787 Secondary malignant neoplasm of liver and intrahepatic bile duct: Secondary | ICD-10-CM

## 2022-08-13 DIAGNOSIS — Z5112 Encounter for antineoplastic immunotherapy: Secondary | ICD-10-CM | POA: Diagnosis not present

## 2022-08-13 LAB — CBC WITH DIFFERENTIAL (CANCER CENTER ONLY)
Abs Immature Granulocytes: 0.01 10*3/uL (ref 0.00–0.07)
Basophils Absolute: 0 10*3/uL (ref 0.0–0.1)
Basophils Relative: 0 %
Eosinophils Absolute: 0.1 10*3/uL (ref 0.0–0.5)
Eosinophils Relative: 2 %
HCT: 35.5 % — ABNORMAL LOW (ref 36.0–46.0)
Hemoglobin: 11.5 g/dL — ABNORMAL LOW (ref 12.0–15.0)
Immature Granulocytes: 0 %
Lymphocytes Relative: 22 %
Lymphs Abs: 0.7 10*3/uL (ref 0.7–4.0)
MCH: 30.2 pg (ref 26.0–34.0)
MCHC: 32.4 g/dL (ref 30.0–36.0)
MCV: 93.2 fL (ref 80.0–100.0)
Monocytes Absolute: 0.3 10*3/uL (ref 0.1–1.0)
Monocytes Relative: 10 %
Neutro Abs: 2.1 10*3/uL (ref 1.7–7.7)
Neutrophils Relative %: 66 %
Platelet Count: 146 10*3/uL — ABNORMAL LOW (ref 150–400)
RBC: 3.81 MIL/uL — ABNORMAL LOW (ref 3.87–5.11)
RDW: 16 % — ABNORMAL HIGH (ref 11.5–15.5)
WBC Count: 3.3 10*3/uL — ABNORMAL LOW (ref 4.0–10.5)
nRBC: 0 % (ref 0.0–0.2)

## 2022-08-13 LAB — LACTATE DEHYDROGENASE: LDH: 400 U/L — ABNORMAL HIGH (ref 98–192)

## 2022-08-13 LAB — CMP (CANCER CENTER ONLY)
ALT: 19 U/L (ref 0–44)
AST: 31 U/L (ref 15–41)
Albumin: 4 g/dL (ref 3.5–5.0)
Alkaline Phosphatase: 195 U/L — ABNORMAL HIGH (ref 38–126)
Anion gap: 9 (ref 5–15)
BUN: 8 mg/dL (ref 6–20)
CO2: 26 mmol/L (ref 22–32)
Calcium: 9.2 mg/dL (ref 8.9–10.3)
Chloride: 102 mmol/L (ref 98–111)
Creatinine: 0.48 mg/dL (ref 0.44–1.00)
GFR, Estimated: >60 mL/min (ref >60)
Glucose, Bld: 113 mg/dL — ABNORMAL HIGH (ref 70–99)
Potassium: 3.7 mmol/L (ref 3.5–5.1)
Sodium: 137 mmol/L (ref 135–145)
Total Bilirubin: 0.3 mg/dL (ref 0.3–1.2)
Total Protein: 7.1 g/dL (ref 6.5–8.1)

## 2022-08-13 LAB — CEA (IN HOUSE-CHCC): CEA (CHCC-In House): 12.3 ng/mL — ABNORMAL HIGH (ref 0.00–5.00)

## 2022-08-13 MED ORDER — SODIUM CHLORIDE 0.9 % IV SOLN: 8.0000 mg | Freq: Once | INTRAVENOUS | Status: DC

## 2022-08-13 MED ORDER — HEPARIN SOD (PORK) LOCK FLUSH 100 UNIT/ML IV SOLN
500.0000 [IU] | Freq: Once | INTRAVENOUS | Status: AC
  Administered 2022-08-13: 500 [IU] via INTRAVENOUS

## 2022-08-13 MED ORDER — ONDANSETRON HCL 4 MG/2ML IJ SOLN: 8.0000 mg | Freq: Once | INTRAMUSCULAR | Status: DC

## 2022-08-13 MED ORDER — FRUQUINTINIB 5 MG PO CAPS
5.0000 mg | ORAL_CAPSULE | Freq: Every day | ORAL | 4 refills | Status: DC
  Filled 2022-08-13 – 2022-09-04 (×2): qty 21, 21d supply, fill #0

## 2022-08-13 MED ORDER — SODIUM CHLORIDE 0.9 % IV SOLN
10.0000 mg | Freq: Once | INTRAVENOUS | Status: AC
  Administered 2022-08-13: 10 mg via INTRAVENOUS
  Filled 2022-08-13: qty 1

## 2022-08-13 MED ORDER — ONDANSETRON HCL 8 MG PO TABS
8.0000 mg | ORAL_TABLET | Freq: Once | ORAL | Status: AC
  Administered 2022-08-13: 8 mg via ORAL
  Filled 2022-08-13: qty 1

## 2022-08-13 MED ORDER — SODIUM CHLORIDE 0.9 % IV SOLN: INTRAVENOUS | Status: DC

## 2022-08-13 MED ORDER — SODIUM CHLORIDE 0.9 % IV SOLN: INTRAVENOUS | Status: AC

## 2022-08-13 MED ORDER — SODIUM CHLORIDE 0.9% FLUSH
10.0000 mL | Freq: Once | INTRAVENOUS | Status: AC
  Administered 2022-08-13: 10 mL via INTRAVENOUS

## 2022-08-13 MED ORDER — FAMOTIDINE IN NACL 20-0.9 MG/50ML-% IV SOLN
20.0000 mg | Freq: Once | INTRAVENOUS | Status: AC
  Administered 2022-08-13: 20 mg via INTRAVENOUS
  Filled 2022-08-13: qty 50

## 2022-08-13 MED ORDER — FAMOTIDINE IN NACL 20-0.9 MG/50ML-% IV SOLN: 20.0000 mg | INTRAVENOUS | Status: AC

## 2022-08-13 NOTE — Telephone Encounter (Signed)
Oral Oncology Patient Advocate Encounter  After completing a benefits investigation, prior authorization for Rebecca Bullock is not required at this time through [facility] ([facility]).  Patient's copay is $4.00.     Berdine Addison, [facility] Patient [place]  [phone] (phone) [phone] (fax) 08/13/2022 2:33 PM

## 2022-08-13 NOTE — Patient Instructions (Signed)

## 2022-08-13 NOTE — Telephone Encounter (Signed)
Received call from [place] stating that she started throwing up all night on Thursday night about 7 times.  She would have called [place] to come in for fluids but slept all day long on Friday.  Continues with Diarrhea.  Dr [name] notified.  Stop the Lonsurf per Dr [name].  Dr [name] wants to see patient today.  Patient will come in as soon as she gets ready

## 2022-08-13 NOTE — Progress Notes (Signed)
Hematology and Oncology Follow Up Visit  Rebecca Bullock NX:[id number] 05-03-1966 57 y.o. 08/13/2022   Principle Diagnosis:  Metastatic colorectal cancer-liver metastasis- BRAF (+)   Current Therapy:        Status post cycle 1 of chemotherapy with FOLFOXIRI/Avastin Encorafenib/Vectibix -- started on 06/19/2021, s/p cycle #6 -- d/c on 10/31/2021 Intrahepatic TACE -- 03/29/2022 FOLFOXIRI/Avastin-- s/p cycle  #5-- start on 02/19/2022, -oxaliplatin dropped on 04/03/2022 -- d/c on 07/03/2022 IV iron-Ferrlecit given on 05/10/2022  Lonsurf/Avastin -- s/p cycle #1 -- start on 07/07/2022 Zometa 4 mg IV every 3 months -next dose on 09/2022  XRT to L1 vertebral body --start on 07/10/2022               Interim History:  Rebecca Bullock is here today for follow-up.  She she is doing quite well right now.  She had little bit of diarrhea this morning.  However, I much rather her have diarrhea then have constipated and began to have a blockage.  She has finished her radiation therapy to the back.  There is still little bit of back discomfort.  She is on Lonsurf.  She has had 1 cycle to date.  We will have to see what her CEA level is.  She has had little bit of a cough.  It is a dry cough.  She has had no problems with urine.  She has had no dysuria or urinary frequency.  There is been no mouth sores.  She has had no headache.  There is been no leg swelling.  She has had no rashes.  This will occasional nausea.  Overall, I would have said that her performance status is probably ECOG 1.      Medications:  Allergies as of 08/13/2022       Reactions   Augmentin [amoxicillin-pot Clavulanate] Diarrhea   Extreme diarrhea, abdominal pain.   Irinotecan Other (See Comments)   Flushing, tingling, visual disturbance   Metronidazole Diarrhea, Other (See Comments)   Vancomycin Rash   ?red syndrome   Tape Rash        Medication List        Accurate as of August 13, 2022 12:41 PM. If you have any questions,  ask your nurse or doctor.          Acetaminophen 325 MG Caps Take 650 mg by mouth every 8 (eight) hours as needed for moderate pain or headache.   cyclobenzaprine 10 MG tablet Commonly known as: FLEXERIL Take 1 tablet (10 mg total) by mouth 3 (three) times daily as needed for muscle spasms.   diphenoxylate-atropine 2.5-0.025 MG tablet Commonly known as: LOMOTIL Take 2 tablets by mouth 4 (four) times daily as needed for diarrhea or loose stools.   famotidine 40 MG tablet Commonly known as: PEPCID Take 1 tablet (40 mg total) by mouth 2 (two) times daily.   HYDROmorphone 2 MG tablet Commonly known as: Dilaudid Take 1 tablet (2 mg total) by mouth every 4 (four) hours as needed for severe pain.   Lonsurf 20-8.19 MG tablet Generic drug: trifluridine-tipiracil Take 3 tablets (60 mg of trifluridine total) by mouth 2 (two) times daily after a meal. Take within 1 hr after AM & PM meals on days 1-5, 8-12. Repeat every 28 days.   loperamide 2 MG tablet Commonly known as: IMODIUM A-D Take 2 mg by mouth 4 (four) times daily as needed for diarrhea or loose stools.   ondansetron 8 MG tablet Commonly known as: ZOFRAN Take 1 tablet (  8 mg total) by mouth 2 (two) times daily.   Oxycodone HCl 10 MG Tabs Take 1 tablet (10 mg total) by mouth every 6 (six) hours as needed for severe pain.   prochlorperazine 10 MG tablet Commonly known as: COMPAZINE Take 1 tablet (10 mg total) by mouth every 6 (six) hours as needed for nausea or vomiting.        Allergies:  Allergies  Allergen Reactions   Augmentin [Amoxicillin-Pot Clavulanate] Diarrhea    Extreme diarrhea, abdominal pain.   Irinotecan Other (See Comments)    Flushing, tingling, visual disturbance   Metronidazole Diarrhea and Other (See Comments)   Vancomycin Rash    ?red syndrome   Tape Rash    Past Medical History, Surgical history, Social history, and Family History were reviewed and updated.  Review of Systems: Review of  Systems  Constitutional: Negative.   HENT: Negative.    Eyes: Negative.   Respiratory: Negative.    Cardiovascular: Negative.   Gastrointestinal: Negative.        Constipation alternating with diarrhea.   Genitourinary: Negative.   Musculoskeletal: Negative.   Skin: Negative.   Neurological: Negative.   Endo/Heme/Allergies: Negative.   Psychiatric/Behavioral: Negative.       Physical Exam: Her vital signs show temperature of 97.7.  Pulse 91.  Blood pressure 124/74.  Weight is 178 pounds.    Wt Readings from Last 3 Encounters:  08/13/22 174 lb (78.9 kg)  08/07/22 178 lb (80.7 kg)  07/26/22 178 lb 4.8 oz (80.9 kg)    Physical Exam Vitals reviewed.  HENT:     Head: Normocephalic and atraumatic.  Eyes:     Pupils: Pupils are equal, round, and reactive to light.  Cardiovascular:     Rate and Rhythm: Normal rate and regular rhythm.     Heart sounds: Normal heart sounds.  Pulmonary:     Effort: Pulmonary effort is normal.     Breath sounds: Normal breath sounds.  Abdominal:     General: Bowel sounds are normal.     Palpations: Abdomen is soft.  Musculoskeletal:        General: No tenderness or deformity. Normal range of motion.     Cervical back: Normal range of motion.  Lymphadenopathy:     Cervical: No cervical adenopathy.  Skin:    General: Skin is warm and dry.     Findings: No erythema or rash.  Neurological:     Mental Status: She is alert and oriented to person, place, and time.  Psychiatric:        Behavior: Behavior normal.        Thought Content: Thought content normal.        Judgment: Judgment normal.      Lab Results  Component Value Date   WBC 2.2 (L) 08/07/2022   HGB 11.7 (L) 08/07/2022   HCT 36.6 08/07/2022   MCV 93.1 08/07/2022   PLT 136 (L) 08/07/2022   Lab Results  Component Value Date   FERRITIN 243 08/07/2022   IRON 86 08/07/2022   TIBC 287 08/07/2022   UIBC 201 08/07/2022   IRONPCTSAT 30 08/07/2022   Lab Results  Component  Value Date   RETICCTPCT 1.6 08/07/2022   RBC 3.93 08/07/2022   RBC 3.89 08/07/2022   No results found for: "KPAFRELGTCHN", "LAMBDASER", "KAPLAMBRATIO" No results found for: "IGGSERUM", "IGA", "IGMSERUM" No results found for: "TOTALPROTELP", "ALBUMINELP", "A1GS", "A2GS", "BETS", "BETA2SER", "GAMS", "MSPIKE", "SPEI"   Chemistry      Component  Value Date/Time   NA 140 08/07/2022 0819   K 3.3 (L) 08/07/2022 0819   CL 103 08/07/2022 0819   CO2 28 08/07/2022 0819   BUN 5 (L) 08/07/2022 0819   CREATININE 0.47 08/07/2022 0819   CREATININE 0.56 03/23/2021 0000      Component Value Date/Time   CALCIUM 9.3 08/07/2022 0819   ALKPHOS 204 (H) 08/07/2022 0819   AST 31 08/07/2022 0819   ALT 17 08/07/2022 0819   BILITOT 0.3 08/07/2022 0819       Impression and Plan: Ms. Adan is a very charming  57 yo caucasian female with metastatic colon cancer.  She had disease confined to her liver.  However, she then began to have progression.  She had disease in her L1 vertebral body.  She has received radiation therapy to this.  We have her on Lonsurf.  She started her second cycle of Lonsurf.  However, I do still think that she cannot handle Lonsurf.  I know that the FDA recently approved a new kinase inhibitor for metastatic colon cancer.  This is called [place].  I think this might be a reasonable option for her.   She will take this daily for 21 days on and 7 days off.  The starting dose would be 5 mg.  I do not see any reason why she cannot take this.  Again I think this is a reasonable option for her.  While she is here, we will give some IV fluids.  I will also give her some IV antiemetics and some IV antiacid.  She is trying hard.  Her back seems to be feeling a little bit better.  She is on hydromorphone which she takes only at nighttime.  I told her not to start the [place] until after Easter.  I really want her off any medication for couple weeks.  She says she took her Lonsurf this  morning.  I wanted to be able to enjoy Easter with her family.  I do not see a problem with her not being on medication for 2 weeks.  We will plan to have her come back to see [place] in about 2 weeks or so.  Marland Kitchen    [name], MD 3/18/202412:41 PM

## 2022-08-13 NOTE — Patient Instructions (Signed)

## 2022-08-14 ENCOUNTER — Ambulatory Visit: Payer: Medicaid Other | Admitting: Dietician

## 2022-08-14 NOTE — Telephone Encounter (Signed)
Oral Oncology Pharmacist Encounter  Received new prescription for Fruzaqla (fruquintinib) for the treatment of metastatic colorectal cancer, planned duration until disease progression or unacceptable drug toxicity. Plan for patient to start after 08/26/22 per MD note.   CMP and CBC w/ Diff from 08/13/22 assessed, pt with WBC 3.3 K/uL ([place] 2100), pltc of 146 K/uL. Urine protein from 07/17/22 is negative. No baseline dose adjustments required at this time. Prescription dose and frequency assessed for appropriateness.  Current medication list in Epic reviewed, no relevant/significant DDIs with [place] identified.  Evaluated chart and no patient barriers to medication adherence noted.   Prescription has been e-scribed to the [facility] for benefits analysis and approval.  Oral Oncology Clinic will continue to follow for insurance authorization, copayment issues, initial counseling and start date.  Leron Croak, PharmD, BCPS, [facility] Hematology/Oncology Clinical Pharmacist [facility] and [place] [phone] 08/14/2022 8:28 AM

## 2022-08-14 NOTE — Progress Notes (Signed)
Nutrition Follow Up:  Called patient on home/cell phone to monitor PO intake and bowel regularity.  She reports weight loss due to poor tolerance and last treatment she was nauseated (vomited 9 times) and had diarrhea for 3 -4 days.  Bowels this weekend improved but she still had episode of urgent stool that finishes as liquid. Hansel Starling ran right through her as well. Her treatment will switch to [place]. She will start new med on 08/27/22.  She reports trying to make some changes to diet to improve health.  Started using [place] yogurts with 1/2 banana and Honey Nut Cheerios also bought some frosted mini wheats (that I asked to hold off on). She bought a waffle make to have healthy option than frozen waffles, also been craving more mashed and baked potatoes (doesn't eat skins).  Fluids increased with use of [place] Ices (4 cups a day)   Anthropometrics: lost 4# more past week   Height: 61" Weight:  08/13/22  174# 07/26/22  178.3 07/17/22  188# UBW: 185# BMI: 32.88   INTERVENTION:   Reviewed strategies for and diarrhea management.  Encouraged small frequent feeds and trying to eat 6 small meals. Suggested use of Greek yogurt as lean protein source. Reviewed sources of soft soluble fibers, avoiding raw and skins and seeds. Encouraged SF jello, [place] flavoring, Iced herbal teas, flavored waters to aid in increasing fluids to try    MONITORING, EVALUATION, GOAL: weight, PO intake, Nutrition Impact Symptoms, labs Goal is weight maintenance   Next Visit: Remote next month to assess NIS after new treatment.   April Manson, RDN, LDN Registered Dietitian, [place] Part Time Remote (Usual office hours: Tuesday-Thursday) Cell: [phone]

## 2022-08-15 ENCOUNTER — Telehealth: Payer: Self-pay

## 2022-08-15 NOTE — Telephone Encounter (Signed)
Received phone call from patient inquiring about her hoarse voice stating she knows it is a side effect of her treatment but wondering how long it will last and if Dr. [name] is ok for her to take herbal supplements (specifically [place] Tail Mushroom). Per Dr. [name] the hoarse voice will last approximately 2 weeks and he is fine with herbal supplements. Pt verbalized understanding and had no further questions.

## 2022-08-18 ENCOUNTER — Other Ambulatory Visit: Payer: Self-pay

## 2022-08-18 ENCOUNTER — Inpatient Hospital Stay (HOSPITAL_COMMUNITY): Payer: Medicaid Other

## 2022-08-18 ENCOUNTER — Emergency Department (HOSPITAL_COMMUNITY): Payer: Medicaid Other

## 2022-08-18 ENCOUNTER — Inpatient Hospital Stay (HOSPITAL_COMMUNITY)
Admission: EM | Admit: 2022-08-18 | Discharge: 2022-08-23 | DRG: 329 | Disposition: A | Discharge status: Home / Self Care | Payer: Medicaid Other | Attending: Family Medicine | Admitting: Family Medicine

## 2022-08-18 DIAGNOSIS — E669 Obesity, unspecified: Secondary | ICD-10-CM | POA: Diagnosis present

## 2022-08-18 DIAGNOSIS — C787 Secondary malignant neoplasm of liver and intrahepatic bile duct: Secondary | ICD-10-CM | POA: Diagnosis present

## 2022-08-18 DIAGNOSIS — Z91048 Other nonmedicinal substance allergy status: Secondary | ICD-10-CM

## 2022-08-18 DIAGNOSIS — K8020 Calculus of gallbladder without cholecystitis without obstruction: Secondary | ICD-10-CM | POA: Diagnosis present

## 2022-08-18 DIAGNOSIS — K56609 Unspecified intestinal obstruction, unspecified as to partial versus complete obstruction: Secondary | ICD-10-CM | POA: Diagnosis not present

## 2022-08-18 DIAGNOSIS — K565 Intestinal adhesions [bands], unspecified as to partial versus complete obstruction: Secondary | ICD-10-CM | POA: Diagnosis not present

## 2022-08-18 DIAGNOSIS — T451X5A Adverse effect of antineoplastic and immunosuppressive drugs, initial encounter: Secondary | ICD-10-CM | POA: Diagnosis present

## 2022-08-18 DIAGNOSIS — C7951 Secondary malignant neoplasm of bone: Secondary | ICD-10-CM | POA: Diagnosis present

## 2022-08-18 DIAGNOSIS — K760 Fatty (change of) liver, not elsewhere classified: Secondary | ICD-10-CM | POA: Diagnosis present

## 2022-08-18 DIAGNOSIS — Z881 Allergy status to other antibiotic agents status: Secondary | ICD-10-CM

## 2022-08-18 DIAGNOSIS — D61818 Other pancytopenia: Secondary | ICD-10-CM | POA: Insufficient documentation

## 2022-08-18 DIAGNOSIS — Z79899 Other long term (current) drug therapy: Secondary | ICD-10-CM | POA: Diagnosis not present

## 2022-08-18 DIAGNOSIS — Z820 Family history of epilepsy and other diseases of the nervous system: Secondary | ICD-10-CM

## 2022-08-18 DIAGNOSIS — K56691 Other complete intestinal obstruction: Principal | ICD-10-CM | POA: Diagnosis present

## 2022-08-18 DIAGNOSIS — C19 Malignant neoplasm of rectosigmoid junction: Principal | ICD-10-CM | POA: Diagnosis present

## 2022-08-18 DIAGNOSIS — J302 Other seasonal allergic rhinitis: Secondary | ICD-10-CM | POA: Diagnosis present

## 2022-08-18 DIAGNOSIS — D61810 Antineoplastic chemotherapy induced pancytopenia: Secondary | ICD-10-CM | POA: Diagnosis present

## 2022-08-18 DIAGNOSIS — Z800 Family history of malignant neoplasm of digestive organs: Secondary | ICD-10-CM

## 2022-08-18 DIAGNOSIS — Z6832 Body mass index (BMI) 32.0-32.9, adult: Secondary | ICD-10-CM | POA: Diagnosis not present

## 2022-08-18 DIAGNOSIS — C182 Malignant neoplasm of ascending colon: Principal | ICD-10-CM

## 2022-08-18 DIAGNOSIS — Z91040 Latex allergy status: Secondary | ICD-10-CM | POA: Diagnosis not present

## 2022-08-18 DIAGNOSIS — D72819 Decreased white blood cell count, unspecified: Secondary | ICD-10-CM | POA: Diagnosis not present

## 2022-08-18 DIAGNOSIS — N179 Acute kidney failure, unspecified: Secondary | ICD-10-CM | POA: Diagnosis present

## 2022-08-18 DIAGNOSIS — C189 Malignant neoplasm of colon, unspecified: Secondary | ICD-10-CM | POA: Diagnosis present

## 2022-08-18 DIAGNOSIS — Z880 Allergy status to penicillin: Secondary | ICD-10-CM | POA: Diagnosis not present

## 2022-08-18 LAB — CBC WITH DIFFERENTIAL/PLATELET
Abs Immature Granulocytes: 0.04 10*3/uL (ref 0.00–0.07)
Basophils Absolute: 0 10*3/uL (ref 0.0–0.1)
Basophils Relative: 0 %
Eosinophils Absolute: 0 10*3/uL (ref 0.0–0.5)
Eosinophils Relative: 0 %
HCT: 45.4 % (ref 36.0–46.0)
Hemoglobin: 14.9 g/dL (ref 12.0–15.0)
Immature Granulocytes: 1 %
Lymphocytes Relative: 10 %
Lymphs Abs: 0.6 10*3/uL — ABNORMAL LOW (ref 0.7–4.0)
MCH: 30.3 pg (ref 26.0–34.0)
MCHC: 32.8 g/dL (ref 30.0–36.0)
MCV: 92.3 fL (ref 80.0–100.0)
Monocytes Absolute: 0.5 10*3/uL (ref 0.1–1.0)
Monocytes Relative: 9 %
Neutro Abs: 4.8 10*3/uL (ref 1.7–7.7)
Neutrophils Relative %: 80 %
Platelets: 218 10*3/uL (ref 150–400)
RBC: 4.92 MIL/uL (ref 3.87–5.11)
RDW: 17.4 % — ABNORMAL HIGH (ref 11.5–15.5)
WBC: 6 10*3/uL (ref 4.0–10.5)
nRBC: 0 % (ref 0.0–0.2)

## 2022-08-18 LAB — COMPREHENSIVE METABOLIC PANEL
ALT: 29 U/L (ref 0–44)
AST: 32 U/L (ref 15–41)
Albumin: 4.1 g/dL (ref 3.5–5.0)
Alkaline Phosphatase: 223 U/L — ABNORMAL HIGH (ref 38–126)
Anion gap: 14 (ref 5–15)
BUN: 26 mg/dL — ABNORMAL HIGH (ref 6–20)
CO2: 23 mmol/L (ref 22–32)
Calcium: 9.3 mg/dL (ref 8.9–10.3)
Chloride: 100 mmol/L (ref 98–111)
Creatinine, Ser: 1.46 mg/dL — ABNORMAL HIGH (ref 0.44–1.00)
GFR, Estimated: 43 mL/min — ABNORMAL LOW (ref >60)
Glucose, Bld: 140 mg/dL — ABNORMAL HIGH (ref 70–99)
Potassium: 3.7 mmol/L (ref 3.5–5.1)
Sodium: 137 mmol/L (ref 135–145)
Total Bilirubin: 1.1 mg/dL (ref 0.3–1.2)
Total Protein: 8.2 g/dL — ABNORMAL HIGH (ref 6.5–8.1)

## 2022-08-18 MED ORDER — SODIUM CHLORIDE 0.9 % IV SOLN
1.0000 g | Freq: Once | INTRAVENOUS | Status: AC
  Administered 2022-08-18: 1 g via INTRAVENOUS
  Filled 2022-08-18: qty 10

## 2022-08-18 MED ORDER — ONDANSETRON HCL 4 MG PO TABS: 4.0000 mg | ORAL_TABLET | Freq: Four times a day (QID) | ORAL | Status: DC | PRN

## 2022-08-18 MED ORDER — MORPHINE SULFATE (PF) 4 MG/ML IV SOLN
4.0000 mg | INTRAVENOUS | Status: DC | PRN
  Administered 2022-08-18 – 2022-08-19 (×2): 4 mg via INTRAVENOUS
  Filled 2022-08-18 (×2): qty 1

## 2022-08-18 MED ORDER — SODIUM CHLORIDE 0.9 % IV BOLUS
2000.0000 mL | Freq: Once | INTRAVENOUS | Status: AC
  Administered 2022-08-18: 1000 mL via INTRAVENOUS

## 2022-08-18 MED ORDER — ONDANSETRON HCL 4 MG/2ML IJ SOLN
4.0000 mg | Freq: Four times a day (QID) | INTRAMUSCULAR | Status: DC | PRN
  Administered 2022-08-20 – 2022-08-21 (×2): 4 mg via INTRAVENOUS
  Filled 2022-08-18: qty 2

## 2022-08-18 MED ORDER — ONDANSETRON HCL 4 MG/2ML IJ SOLN
4.0000 mg | Freq: Once | INTRAMUSCULAR | Status: AC
  Administered 2022-08-18: 4 mg via INTRAVENOUS
  Filled 2022-08-18: qty 2

## 2022-08-18 MED ORDER — ACETAMINOPHEN 650 MG RE SUPP: 650.0000 mg | Freq: Four times a day (QID) | RECTAL | Status: DC | PRN

## 2022-08-18 MED ORDER — ACETAMINOPHEN 325 MG PO TABS: 650.0000 mg | ORAL_TABLET | Freq: Four times a day (QID) | ORAL | Status: DC | PRN

## 2022-08-18 MED ORDER — MORPHINE SULFATE (PF) 2 MG/ML IV SOLN
2.0000 mg | Freq: Once | INTRAVENOUS | Status: AC
  Administered 2022-08-18: 2 mg via INTRAVENOUS
  Filled 2022-08-18: qty 1

## 2022-08-18 MED ORDER — HYDROMORPHONE HCL 1 MG/ML IJ SOLN
1.0000 mg | Freq: Once | INTRAMUSCULAR | Status: AC
  Administered 2022-08-18: 1 mg via INTRAVENOUS
  Filled 2022-08-18: qty 1

## 2022-08-18 MED ORDER — POTASSIUM CHLORIDE IN NACL 20-0.9 MEQ/L-% IV SOLN
INTRAVENOUS | Status: DC
  Filled 2022-08-18 (×6): qty 1000

## 2022-08-18 MED ORDER — IOHEXOL 300 MG/ML  SOLN
100.0000 mL | Freq: Once | INTRAMUSCULAR | Status: AC | PRN
  Administered 2022-08-18: 100 mL via INTRAVENOUS

## 2022-08-18 NOTE — ED Provider Notes (Signed)
[facility] Provider Note   CSN: [id number] Arrival date & time: 08/18/22  [id number]     History  Chief Complaint  Patient presents with   Constipation    Rebecca Bullock is a 57 y.o. female, history of colon cancer stage IV, who presents to the ED secondary to diffuse abdominal pain constipation for 7 days, and vomiting for 24 hours.  She states that she is currently through on chemo, for metastatic colon cancer, and has had episodes or of about 7 days of constipation, and diarrhea after the eighth day, and typically only vomits for 24 hours, but became concerned because today she has been vomiting for greater than 24 hours, and then abdominal pain is getting worse.  She is vomiting brown liquid.  And states she cannot keep anything down.  Is known to have a tumor in her bowel, which is causing obstruction, has been able to have diarrhea in the past, but now cannot have any bowel movement.  States that she is little short of breath, and feels like there is something pressing on her diaphragm.  Denies any chest pain.  Oncologist is Dr. [name].    Home Medications Prior to Admission medications   Medication Sig Start Date End Date Taking? Authorizing Provider  Acetaminophen 325 MG CAPS Take 650 mg by mouth every 8 (eight) hours as needed for moderate pain or headache.    [provider]  cyclobenzaprine (FLEXERIL) 10 MG tablet Take 1 tablet (10 mg total) by mouth 3 (three) times daily as needed for muscle spasms. 07/04/22   [name], MD  diphenoxylate-atropine (LOMOTIL) 2.5-0.025 MG tablet Take 2 tablets by mouth 4 (four) times daily as needed for diarrhea or loose stools. 11/22/21   [name], MD  famotidine (PEPCID) 40 MG tablet Take 1 tablet (40 mg total) by mouth 2 (two) times daily. 08/07/22   [name], MD  fruquintinib (FRUZAQLA) 5 MG capsule Take 1 capsule (5 mg total) by mouth daily. Take for 21 days on, then off for 7  days. Repeat every 28 days. 08/13/22   [name], MD  HYDROmorphone (DILAUDID) 2 MG tablet Take 1 tablet (2 mg total) by mouth every 4 (four) hours as needed for severe pain. 07/27/22   [name], MD  loperamide (IMODIUM A-D) 2 MG tablet Take 2 mg by mouth 4 (four) times daily as needed for diarrhea or loose stools. Patient not taking: Reported on 08/13/2022    [provider]  ondansetron (ZOFRAN) 8 MG tablet Take 1 tablet (8 mg total) by mouth 2 (two) times daily. Patient not taking: Reported on 08/13/2022 07/11/22   [name], MD  Oxycodone HCl 10 MG TABS Take 1 tablet (10 mg total) by mouth every 6 (six) hours as needed for severe pain. Patient not taking: Reported on 08/13/2022 07/03/22   [name], MD  prochlorperazine (COMPAZINE) 10 MG tablet Take 1 tablet (10 mg total) by mouth every 6 (six) hours as needed for nausea or vomiting. 07/11/22   [name], MD      Allergies    Augmentin [amoxicillin-pot clavulanate], Irinotecan, Metronidazole, Vancomycin, and Tape    Review of Systems   Review of Systems  Gastrointestinal:  Positive for abdominal pain, constipation and vomiting. Negative for diarrhea.    Physical Exam Updated Vital Signs BP (!) 139/93   Pulse (!) 130   Temp 99.1 F (37.3 C) (Rectal)  Resp (!) 21   Ht 5\' 1"  (1.549 m)   Wt 79 kg   SpO2 99%   BMI 32.91 kg/m  Physical Exam Vitals and nursing note reviewed.  Constitutional:      Appearance: She is well-developed. She is ill-appearing.  HENT:     Head: Normocephalic and atraumatic.     Mouth/Throat:     Mouth: Mucous membranes are dry.  Eyes:     Conjunctiva/sclera: Conjunctivae normal.  Cardiovascular:     Rate and Rhythm: Normal rate and regular rhythm.     Heart sounds: No murmur heard. Pulmonary:     Effort: Pulmonary effort is normal. No respiratory distress.     Breath sounds: Normal breath sounds.  Abdominal:     General: There is distension.     Palpations:  Abdomen is soft.     Tenderness: There is generalized abdominal tenderness. There is no guarding or rebound.  Musculoskeletal:        General: No swelling.     Cervical back: Neck supple.  Skin:    General: Skin is warm and dry.     Capillary Refill: Capillary refill takes less than 2 seconds.  Neurological:     Mental Status: She is alert.  Psychiatric:        Mood and Affect: Mood normal.     ED Results / Procedures / Treatments   Labs (all labs ordered are listed, but only abnormal results are displayed) Labs Reviewed  COMPREHENSIVE METABOLIC PANEL - Abnormal; Notable for the following components:      Result Value   Glucose, Bld 140 (*)    BUN 26 (*)    Creatinine, Ser 1.46 (*)    Total Protein 8.2 (*)    Alkaline Phosphatase 223 (*)    GFR, Estimated 43 (*)    All other components within normal limits  CBC WITH DIFFERENTIAL/PLATELET - Abnormal; Notable for the following components:   RDW 17.4 (*)    Lymphs Abs 0.6 (*)    All other components within normal limits    EKG EKG Interpretation  Date/Time:  Saturday August 18 2022 09:55:39 EDT Ventricular Rate:  130 PR Interval:  132 QRS Duration: 87 QT Interval:  309 QTC Calculation: 455 R Axis:   52 Text Interpretation: Sinus tachycardia Rate faster Confirmed by Ezequiel Essex [phone]) on 08/18/2022 10:08:47 AM  Radiology CT ABDOMEN PELVIS W CONTRAST  Result Date: 08/18/2022 CLINICAL DATA:  Colon cancer with hepatic and osseous metastatic disease, prior Y-90 radioembolization of the right hepatic lobe. Constipation, possible bowel obstruction. * Tracking Code: BO * EXAM: CT ABDOMEN AND PELVIS WITH CONTRAST TECHNIQUE: Multidetector CT imaging of the abdomen and pelvis was performed using the standard protocol following bolus administration of intravenous contrast. RADIATION DOSE REDUCTION: This exam was performed according to the departmental dose-optimization program which includes automated exposure control,  adjustment of the mA and/or kV according to patient size and/or use of iterative reconstruction technique. CONTRAST:  136mL OMNIPAQUE IOHEXOL 300 MG/ML  SOLN COMPARISON:  Multiple exams, including 06/28/2022 FINDINGS: Lower chest: 2 by 3 mm right middle lobe nodule on image 5 series 6, no change from 10/30/2021. Yaseen Gilberg type 1 hiatal hernia. Trace right pleural effusion. Hepatobiliary: Probable hepatic steatosis. Multiple gallstones measuring up to 1.7 cm in diameter. The dominant posterior right hepatic lobe mass measures 10.0 by 7.5 cm on image 20 series 2, formerly 9.1 by 7.0 cm. This lesion has peripheral and some mild internal suspected enhancement likely reflecting components  of viable tumor. A Kohler Pellerito satellite lesion along the caudate lobe measures 3.0 by 2.7 cm on image 27 series 2, formerly 2.6 by 1.9 cm. A fluid density 2.0 by 1.6 cm lesion inferiorly in the right hepatic lobe on image 43 of series 2 previously measured same. 0.8 by 0.5 cm lesion in the left hepatic lobe on image 22 series 2, too Braylon Grenda to characterize. Benjerman Molinelli suspected cyst in segment 4 of the liver near the falciform ligament on image 25 series 2, stable. Pancreas: Unremarkable Spleen: Stable fluid density lesion in the upper spleen measuring 1.4 by 1.0 cm on image 15 series 2. Adrenals/Urinary Tract: The right posterior hepatic mass abuts but does not definitively invade the right adrenal gland. No significant renal lesion. Stomach/Bowel: Dilated loops of Kamdyn Covel bowel and proximal colon extending up to the level the ascending colon mass which measures 2.4 cm in length on image 66 series 5. The colon distal to this mass is of normal caliber in the appearance is compatible with obstruction. Loops of Yakub Lodes bowel measure up to 4 cm in diameter. Vascular/Lymphatic: Pathologic portacaval lymph node 1.9 cm in short axis on image 28 series 2, formerly 1.7 cm by my measurements. A right paracolic node close to the ascending colon mass measures 0.8 cm  in short axis on image 45 series 2, previously 0.9 cm. A pathologic aortocaval node measures 1.2 cm in short axis on image 47 series 2, previously same by my measurements. Reproductive: Unremarkable Other: No supplemental non-categorized findings. Musculoskeletal: 1.8 cm rim sclerotic lesion in the L1 vertebral body similar to the prior exam IMPRESSION: 1. High-grade obstruction at the level of the ascending colon mass, with dilated loops of Kodi Guerrera bowel and proximal colon up to 4 cm in diameter. 2. Mild enlargement of the dominant posterior right hepatic lobe mass and satellite lesion along the caudate lobe of the liver. 3. Mild enlargement of the pathologic portacaval node. Stable prominent aortocaval and right paracolic lymph nodes. 4. Stable 1.8 cm rim sclerotic lesion in the L1 vertebral body, compatible with malignancy. 5. Stable 2 by 3 mm right middle lobe nodule. 6. Randal Yepiz type 1 hiatal hernia. 7. Trace right pleural effusion. 8. Cholelithiasis. 9. Probable hepatic steatosis. 10. Stable fluid density lesion in the upper spleen, probably a cyst. Electronically Signed   By: Van Clines M.D.   On: 08/18/2022 13:04   DG Abdomen 1 View  Result Date: 08/18/2022 CLINICAL DATA:  Abdominal pain, nausea and vomiting for several months, history of metastatic colon cancer EXAM: ABDOMEN - 1 VIEW COMPARISON:  07/27/2022 FINDINGS: 2 supine frontal views of the abdomen and pelvis are obtained. No bowel obstruction or ileus. No abdominal masses. Calcified gallstones again noted. No acute bony abnormalities. IMPRESSION: 1. Unremarkable bowel gas pattern. 2. Cholelithiasis. Electronically Signed   By: Randa Ngo M.D.   On: 08/18/2022 11:13   DG Chest 2 View  Result Date: 08/18/2022 CLINICAL DATA:  Abdominal pain, nausea and vomiting for several months, history of metastatic colon cancer EXAM: CHEST - 2 VIEW COMPARISON:  06/19/2021 FINDINGS: Frontal and lateral views of the chest demonstrate a stable right  chest wall port. Cardiac silhouette is unremarkable. No airspace disease, effusion, or pneumothorax. No acute bony abnormalities. IMPRESSION: 1. No acute intrathoracic process. Electronically Signed   By: Randa Ngo M.D.   On: 08/18/2022 11:12    Procedures Procedures    Medications Ordered in ED Medications  morphine (PF) 2 MG/ML injection 2 mg (has no administration in  time range)  HYDROmorphone (DILAUDID) injection 1 mg (1 mg Intravenous Given 08/18/22 1113)  sodium chloride 0.9 % bolus 2,000 mL (1,000 mLs Intravenous New Bag/Given 08/18/22 1113)  ondansetron (ZOFRAN) injection 4 mg (4 mg Intravenous Given 08/18/22 1113)  cefTRIAXone (ROCEPHIN) 1 g in sodium chloride 0.9 % 100 mL IVPB (0 g Intravenous Stopped 08/18/22 1144)  iohexol (OMNIPAQUE) 300 MG/ML solution 100 mL (100 mLs Intravenous Contrast Given 08/18/22 1229)    ED Course/ Medical Decision Making/ A&P                             Medical Decision Making Patient is a 57 year old female, history of colon cancer stage IV, who is undergoing active cancer treatment with chemotherapy.  States that she has not had a bowel movement for the last 7 days.  Typically can have a bowel movement, and that after 7-day days, but has been vomiting brown emesis for the last 24+ hours..  We obtained a CT abdomen pelvis, for further evaluation as well as CBC CMP.  Concern for possible aspiration so we will obtain a chest x-ray.  Start ceftriaxone.  Amount and/or Complexity of Data Reviewed Labs: ordered.    Details: Creatinine of 1.46 markedly elevated Radiology: ordered.    Details: CT abdomen pelvis, shows obstruction secondary to colon tumor, with dilated Raniya Golembeski bowel.  Also  worsening hepatic tumor.   Discussion of management or test interpretation with external provider(s): Discussed with patient, CT abdomen/pelvis findings.  Dr. Melanee Left spoke with general surgery, Dr. Donne Hazel, he will evaluate patient, and see them in patient.  Recommends  consulting GI and oncology.  Admitting to hospitalist.  States the only option is possibly an ileostomy.  Reported recommending that oncology talk to patient about goals of care. Spoke with Dr. Dr. Paulita Fujita, he states there is nothing that the [facility] can do about this as it is not in the ascending colon.  States that if surgery has any other requests for them to reach out directly.  I spoke with Dr. Earlie Server, informed him of patient's condition, and requested them to evaluate patient comments discussed goals of care.  Additionally spoke with Dr. Olevia Bowens, hospitalist, he exacerbation very mission.  MedSurg bed.  Risk Prescription drug management. Decision regarding hospitalization.    Final Clinical Impression(s) / ED Diagnoses Final diagnoses:  Other complete intestinal obstruction [facility])  Colon cancer metastasized to liver [facility])  AKI (acute kidney injury) [facility])    Rx / [place] Orders ED Discharge Orders     None         Osvaldo Shipper, PA 08/18/22 1352    Ezequiel Essex, MD 08/18/22 1642

## 2022-08-18 NOTE — ED Notes (Signed)
ED TO INPATIENT HANDOFF REPORT  ED Nurse Name and Phone #: Varney Biles. Saul Fordyce Name/Age/Gender Rebecca Bullock 57 y.o. female Room/Bed: WA06/WA06  Code Status   Code Status: Full Code  Home/SNF/Other Home Patient oriented to: self, place, time, and situation Is this baseline? Yes   Triage Complete: Triage complete  Chief Complaint Bowel obstruction ([place]) [id number]  Triage Note Pt reports Ross Bender emesis, abd pain and 7 day constipation. Hx stage 4 colon cancer   Allergies Allergies  Allergen Reactions   Augmentin [Amoxicillin-Pot Clavulanate] Diarrhea    Extreme diarrhea, abdominal pain.   Irinotecan Other (See Comments)    Flushing, tingling, visual disturbance   Metronidazole Diarrhea and Other (See Comments)   Vancomycin Rash    ?red syndrome   Tape Rash    Level of Care/Admitting Diagnosis ED Disposition     ED Disposition  Admit   Condition  --   Comment  Hospital Area: [place] [[id number]]  Level of Care: Med-Surg [16]  May admit patient to [facility] or [facility] if equivalent level of care is available:: No  Covid Evaluation: Asymptomatic - no recent exposure (last 10 days) testing not required  [facility]: [id number]  Admitting Physician: Reubin Milan [id number]  Attending Physician: Reubin Milan [id number]  Certification:: I certify this patient will need inpatient services for at least 2 midnights  Estimated Length of Stay: 2          B Medical/Surgery History Past Medical History:  Diagnosis Date   Colon cancer metastasized to liver ([place]) 05/05/2021   Family history of lung cancer    Family history of pancreatic cancer    Goals of care, counseling/discussion 05/05/2021   Past Surgical History:  Procedure Laterality Date   IR 3D INDEPENDENT WKST  03/15/2022   IR ANGIOGRAM SELECTIVE EACH ADDITIONAL VESSEL  03/15/2022   IR ANGIOGRAM SELECTIVE EACH ADDITIONAL VESSEL  03/15/2022   IR  ANGIOGRAM SELECTIVE EACH ADDITIONAL VESSEL  03/15/2022   IR ANGIOGRAM SELECTIVE EACH ADDITIONAL VESSEL  03/29/2022   IR ANGIOGRAM SELECTIVE EACH ADDITIONAL VESSEL  03/29/2022   IR ANGIOGRAM VISCERAL SELECTIVE  03/15/2022   IR ANGIOGRAM VISCERAL SELECTIVE  03/29/2022   IR EMBO ARTERIAL NOT HEMORR HEMANG INC GUIDE ROADMAPPING  03/15/2022   IR EMBO TUMOR ORGAN ISCHEMIA INFARCT INC GUIDE ROADMAPPING  03/29/2022   IR IMAGING GUIDED PORT INSERTION  05/11/2021   IR RADIOLOGIST EVAL & MGMT  02/15/2022   IR RADIOLOGIST EVAL & MGMT  04/23/2022   IR RADIOLOGIST EVAL & MGMT  06/25/2022   IR US GUIDE VASC ACCESS RIGHT  03/29/2022   uterine ablation       A IV Location/Drains/Wounds Patient Lines/Drains/Airways Status     Active Line/Drains/Airways     Name Placement date Placement time Site Days   Implanted Port 05/11/21 Right Chest 05/11/21  1000  Chest  464            Intake/Output Last 24 hours  Intake/Output Summary (Last 24 hours) at 08/18/2022 1400 Last data filed at 08/18/2022 1144 Gross per 24 hour  Intake 100 ml  Output --  Net 100 ml    Labs/Imaging Results for orders placed or performed during the hospital encounter of 08/18/22 (from the past 48 hour(s))  Comprehensive metabolic panel     Status: Abnormal   Collection Time: 08/18/22 10:49 AM  Result Value Ref Range   Sodium 137 135 - 145 mmol/L   Potassium 3.7 3.5 -  5.1 mmol/L   Chloride 100 98 - 111 mmol/L   CO2 23 22 - 32 mmol/L   Glucose, Bld 140 (H) 70 - 99 mg/dL    Comment: Glucose reference range applies only to samples taken after fasting for at least 8 hours.   BUN 26 (H) 6 - 20 mg/dL   Creatinine, Ser 1.46 (H) 0.44 - 1.00 mg/dL   Calcium 9.3 8.9 - 10.3 mg/dL   Total Protein 8.2 (H) 6.5 - 8.1 g/dL   Albumin 4.1 3.5 - 5.0 g/dL   AST 32 15 - 41 U/L   ALT 29 0 - 44 U/L   Alkaline Phosphatase 223 (H) 38 - 126 U/L   Total Bilirubin 1.1 0.3 - 1.2 mg/dL   GFR, Estimated 43 (L) >60 mL/min    Comment:  (NOTE) Calculated using the CKD-EPI Creatinine Equation (2021)    Anion gap 14 5 - 15    Comment: Performed at [facility], [place] [address] [place], [place] [address]  CBC with Differential     Status: Abnormal   Collection Time: 08/18/22 10:49 AM  Result Value Ref Range   WBC 6.0 4.0 - 10.5 K/uL   RBC 4.92 3.87 - 5.11 MIL/uL   Hemoglobin 14.9 12.0 - 15.0 g/dL   HCT 45.4 36.0 - 46.0 %   MCV 92.3 80.0 - 100.0 fL   MCH 30.3 26.0 - 34.0 pg   MCHC 32.8 30.0 - 36.0 g/dL   RDW 17.4 (H) 11.5 - 15.5 %   Platelets 218 150 - 400 K/uL   nRBC 0.0 0.0 - 0.2 %   Neutrophils Relative % 80 %   Neutro Abs 4.8 1.7 - 7.7 K/uL   Lymphocytes Relative 10 %   Lymphs Abs 0.6 (L) 0.7 - 4.0 K/uL   Monocytes Relative 9 %   Monocytes Absolute 0.5 0.1 - 1.0 K/uL   Eosinophils Relative 0 %   Eosinophils Absolute 0.0 0.0 - 0.5 K/uL   Basophils Relative 0 %   Basophils Absolute 0.0 0.0 - 0.1 K/uL   Immature Granulocytes 1 %   Abs Immature Granulocytes 0.04 0.00 - 0.07 K/uL    Comment: Performed at [facility], [place] [address] [place], [place] [address]   CT ABDOMEN PELVIS W CONTRAST  Result Date: 08/18/2022 CLINICAL DATA:  Colon cancer with hepatic and osseous metastatic disease, prior Y-90 radioembolization of the right hepatic lobe. Constipation, possible bowel obstruction. * Tracking Code: BO * EXAM: CT ABDOMEN AND PELVIS WITH CONTRAST TECHNIQUE: Multidetector CT imaging of the abdomen and pelvis was performed using the standard protocol following bolus administration of intravenous contrast. RADIATION DOSE REDUCTION: This exam was performed according to the departmental dose-optimization program which includes automated exposure control, adjustment of the mA and/or kV according to patient size and/or use of iterative reconstruction technique. CONTRAST:  141mL OMNIPAQUE IOHEXOL 300 MG/ML  SOLN COMPARISON:  Multiple exams, including 06/28/2022 FINDINGS: Lower chest: 2 by 3  mm right middle lobe nodule on image 5 series 6, no change from 10/30/2021. Small type 1 hiatal hernia. Trace right pleural effusion. Hepatobiliary: Probable hepatic steatosis. Multiple gallstones measuring up to 1.7 cm in diameter. The dominant posterior right hepatic lobe mass measures 10.0 by 7.5 cm on image 20 series 2, formerly 9.1 by 7.0 cm. This lesion has peripheral and some mild internal suspected enhancement likely reflecting components of viable tumor. A small satellite lesion along the caudate lobe measures 3.0 by 2.7 cm on image 27  series 2, formerly 2.6 by 1.9 cm. A fluid density 2.0 by 1.6 cm lesion inferiorly in the right hepatic lobe on image 43 of series 2 previously measured same. 0.8 by 0.5 cm lesion in the left hepatic lobe on image 22 series 2, too small to characterize. Small suspected cyst in segment 4 of the liver near the falciform ligament on image 25 series 2, stable. Pancreas: Unremarkable Spleen: Stable fluid density lesion in the upper spleen measuring 1.4 by 1.0 cm on image 15 series 2. Adrenals/Urinary Tract: The right posterior hepatic mass abuts but does not definitively invade the right adrenal gland. No significant renal lesion. Stomach/Bowel: Dilated loops of small bowel and proximal colon extending up to the level the ascending colon mass which measures 2.4 cm in length on image 66 series 5. The colon distal to this mass is of normal caliber in the appearance is compatible with obstruction. Loops of small bowel measure up to 4 cm in diameter. Vascular/Lymphatic: Pathologic portacaval lymph node 1.9 cm in short axis on image 28 series 2, formerly 1.7 cm by my measurements. A right paracolic node close to the ascending colon mass measures 0.8 cm in short axis on image 45 series 2, previously 0.9 cm. A pathologic aortocaval node measures 1.2 cm in short axis on image 47 series 2, previously same by my measurements. Reproductive: Unremarkable Other: No supplemental non-categorized  findings. Musculoskeletal: 1.8 cm rim sclerotic lesion in the L1 vertebral body similar to the prior exam IMPRESSION: 1. High-grade obstruction at the level of the ascending colon mass, with dilated loops of small bowel and proximal colon up to 4 cm in diameter. 2. Mild enlargement of the dominant posterior right hepatic lobe mass and satellite lesion along the caudate lobe of the liver. 3. Mild enlargement of the pathologic portacaval node. Stable prominent aortocaval and right paracolic lymph nodes. 4. Stable 1.8 cm rim sclerotic lesion in the L1 vertebral body, compatible with malignancy. 5. Stable 2 by 3 mm right middle lobe nodule. 6. Small type 1 hiatal hernia. 7. Trace right pleural effusion. 8. Cholelithiasis. 9. Probable hepatic steatosis. 10. Stable fluid density lesion in the upper spleen, probably a cyst. Electronically Signed   By: Van Clines M.D.   On: 08/18/2022 13:04   DG Abdomen 1 View  Result Date: 08/18/2022 CLINICAL DATA:  Abdominal pain, nausea and vomiting for several months, history of metastatic colon cancer EXAM: ABDOMEN - 1 VIEW COMPARISON:  07/27/2022 FINDINGS: 2 supine frontal views of the abdomen and pelvis are obtained. No bowel obstruction or ileus. No abdominal masses. Calcified gallstones again noted. No acute bony abnormalities. IMPRESSION: 1. Unremarkable bowel gas pattern. 2. Cholelithiasis. Electronically Signed   By: Randa Ngo M.D.   On: 08/18/2022 11:13   DG Chest 2 View  Result Date: 08/18/2022 CLINICAL DATA:  Abdominal pain, nausea and vomiting for several months, history of metastatic colon cancer EXAM: CHEST - 2 VIEW COMPARISON:  06/19/2021 FINDINGS: Frontal and lateral views of the chest demonstrate a stable right chest wall port. Cardiac silhouette is unremarkable. No airspace disease, effusion, or pneumothorax. No acute bony abnormalities. IMPRESSION: 1. No acute intrathoracic process. Electronically Signed   By: Randa Ngo M.D.   On: 08/18/2022  11:12    Pending Labs Unresulted Labs (From admission, onward)     Start     Ordered   08/19/22 0500  HIV Antibody (routine testing w rflx)  (HIV Antibody (Routine testing w reflex) panel)  Tomorrow morning,  R        08/18/22 1357   08/19/22 0500  CBC  Daily,   R      08/18/22 1357   08/19/22 0500  Comprehensive metabolic panel  Daily,   R      08/18/22 1357            Vitals/Pain Today's Vitals   08/18/22 1115 08/18/22 1159 08/18/22 1215 08/18/22 1347  BP: (!) 139/93  (!) 134/90   Pulse: (!) 130  (!) 106   Resp: (!) 21  (!) 24   Temp:      TempSrc:      SpO2: 99%  98%   Weight:      Height:      PainSc:  0-No pain  5     Isolation Precautions No active isolations  Medications Medications  0.9 % NaCl with KCl 20 mEq/ L  infusion (has no administration in time range)  acetaminophen (TYLENOL) tablet 650 mg (has no administration in time range)    Or  acetaminophen (TYLENOL) suppository 650 mg (has no administration in time range)  ondansetron (ZOFRAN) tablet 4 mg (has no administration in time range)    Or  ondansetron (ZOFRAN) injection 4 mg (has no administration in time range)  HYDROmorphone (DILAUDID) injection 1 mg (1 mg Intravenous Given 08/18/22 1113)  sodium chloride 0.9 % bolus 2,000 mL (1,000 mLs Intravenous New Bag/Given 08/18/22 1113)  ondansetron (ZOFRAN) injection 4 mg (4 mg Intravenous Given 08/18/22 1113)  cefTRIAXone (ROCEPHIN) 1 g in sodium chloride 0.9 % 100 mL IVPB (0 g Intravenous Stopped 08/18/22 1144)  iohexol (OMNIPAQUE) 300 MG/ML solution 100 mL (100 mLs Intravenous Contrast Given 08/18/22 1229)  morphine (PF) 2 MG/ML injection 2 mg (2 mg Intravenous Given 08/18/22 1346)    Mobility walks     Focused Assessments Abd distended with intermittent pain and nausea   R Recommendations: See Admitting Provider Note  Report given to:   Additional Notes: Pt A/Ox4 able to ambulate to toilet.

## 2022-08-18 NOTE — Consult Note (Signed)
Reason for Consult:ab pain Referring Physician: Dr Wyvonnia Dusky  Rebecca Bullock is an 57 y.o. female.  HPI: 77 yof with metastatic colorectal cancer since early 2023.  Treated with chemo and then was planned to undergo hepatectomy/right colectomy but then became unresectable due to cancer progression.  She has undergone intrahepatic TACE with IR.  She went back on chemotherapy.  She has received radiotherapy to L2 recently for met.  She is currently off of [place] but tells me she has also received avastin lately.  She is supposed to start [place] apparently April 1.  She has had a cycle of constipation/diarrhea.  She now has not had a bm in six days.  She has some emesis.  Denies nausea, passing small amount of flatus.  Having ab pain. She was evaluated in ER where she was found to have Cr of 1.46, nl wbc. CT shows (which I have reviewed independently)- high grade obstruction at level of ascending colon mass, right hepatic lobe met,multiple enlarged nodes.  I was asked to see her.   Past Medical History:  Diagnosis Date   Colon cancer metastasized to liver ([place]) 05/05/2021   Family history of lung cancer    Family history of pancreatic cancer    Goals of care, counseling/discussion 05/05/2021    Past Surgical History:  Procedure Laterality Date   IR 3D INDEPENDENT WKST  03/15/2022   IR ANGIOGRAM SELECTIVE EACH ADDITIONAL VESSEL  03/15/2022   IR ANGIOGRAM SELECTIVE EACH ADDITIONAL VESSEL  03/15/2022   IR ANGIOGRAM SELECTIVE EACH ADDITIONAL VESSEL  03/15/2022   IR ANGIOGRAM SELECTIVE EACH ADDITIONAL VESSEL  03/29/2022   IR ANGIOGRAM SELECTIVE EACH ADDITIONAL VESSEL  03/29/2022   IR ANGIOGRAM VISCERAL SELECTIVE  03/15/2022   IR ANGIOGRAM VISCERAL SELECTIVE  03/29/2022   IR EMBO ARTERIAL NOT HEMORR HEMANG INC GUIDE ROADMAPPING  03/15/2022   IR EMBO TUMOR ORGAN ISCHEMIA INFARCT INC GUIDE ROADMAPPING  03/29/2022   IR IMAGING GUIDED PORT INSERTION  05/11/2021   IR RADIOLOGIST EVAL & MGMT  02/15/2022   IR  RADIOLOGIST EVAL & MGMT  04/23/2022   IR RADIOLOGIST EVAL & MGMT  06/25/2022   IR US GUIDE VASC ACCESS RIGHT  03/29/2022   uterine ablation      Family History  Problem Relation Age of Onset   Lung cancer Mother    Diabetes Maternal Grandmother    Pancreatic cancer Maternal Grandfather    Multiple sclerosis Maternal Aunt    Alcohol abuse Maternal Uncle     Social History:  reports that she has never smoked. She has never used smokeless tobacco. She reports current alcohol use. She reports that she does not currently use drugs.  Allergies:  Allergies  Allergen Reactions   Augmentin [Amoxicillin-Pot Clavulanate] Diarrhea and Other (See Comments)    Extreme diarrhea, abdominal pain.   Irinotecan Other (See Comments)    Flushing, tingling, visual disturbance   Metronidazole Diarrhea and Other (See Comments)   Vancomycin Rash and Other (See Comments)    "Red Man Syndrome"   Latex Rash   Tape Rash   Wound Dressing Adhesive Rash    Current Facility-Administered Medications  Medication Dose Route Frequency Provider Last Rate Last Admin   0.9 % NaCl with KCl 20 mEq/ L  infusion   Intravenous Continuous Reubin Milan, MD 125 mL/hr at 08/18/22 1523 New Bag at 08/18/22 1523   acetaminophen (TYLENOL) tablet 650 mg  650 mg Oral Q6H PRN Reubin Milan, MD       Or  acetaminophen (TYLENOL) suppository 650 mg  650 mg Rectal Q6H PRN Reubin Milan, MD       morphine (PF) 4 MG/ML injection 4 mg  4 mg Intravenous Q3H PRN Reubin Milan, MD       ondansetron [facility]) tablet 4 mg  4 mg Oral Q6H PRN Reubin Milan, MD       Or   ondansetron [facility]) injection 4 mg  4 mg Intravenous Q6H PRN Reubin Milan, MD       Facility-Administered Medications Ordered in Other Encounters  Medication Dose Route Frequency Provider Last Rate Last Admin   atropine 1 MG/ML injection            heparin lock flush 100 unit/mL  500 Units Intravenous Once [name], MD        sodium chloride flush (NS) 0.9 % injection 10 mL  10 mL Intracatheter Once PRN Celso Amy, NP       sodium chloride flush (NS) 0.9 % injection 10 mL  10 mL Intravenous PRN [name], MD   10 mL at 10/31/21 1258     Results for orders placed or performed during the hospital encounter of 08/18/22 (from the past 48 hour(s))  Comprehensive metabolic panel     Status: Abnormal   Collection Time: 08/18/22 10:49 AM  Result Value Ref Range   Sodium 137 135 - 145 mmol/L   Potassium 3.7 3.5 - 5.1 mmol/L   Chloride 100 98 - 111 mmol/L   CO2 23 22 - 32 mmol/L   Glucose, Bld 140 (H) 70 - 99 mg/dL    Comment: Glucose reference range applies only to samples taken after fasting for at least 8 hours.   BUN 26 (H) 6 - 20 mg/dL   Creatinine, Ser 1.46 (H) 0.44 - 1.00 mg/dL   Calcium 9.3 8.9 - 10.3 mg/dL   Total Protein 8.2 (H) 6.5 - 8.1 g/dL   Albumin 4.1 3.5 - 5.0 g/dL   AST 32 15 - 41 U/L   ALT 29 0 - 44 U/L   Alkaline Phosphatase 223 (H) 38 - 126 U/L   Total Bilirubin 1.1 0.3 - 1.2 mg/dL   GFR, Estimated 43 (L) >60 mL/min    Comment: (NOTE) Calculated using the CKD-EPI Creatinine Equation (2021)    Anion gap 14 5 - 15    Comment: Performed at [facility], [place] [address] [place],  [address]  CBC with Differential     Status: Abnormal   Collection Time: 08/18/22 10:49 AM  Result Value Ref Range   WBC 6.0 4.0 - 10.5 K/uL   RBC 4.92 3.87 - 5.11 MIL/uL   Hemoglobin 14.9 12.0 - 15.0 g/dL   HCT 45.4 36.0 - 46.0 %   MCV 92.3 80.0 - 100.0 fL   MCH 30.3 26.0 - 34.0 pg   MCHC 32.8 30.0 - 36.0 g/dL   RDW 17.4 (H) 11.5 - 15.5 %   Platelets 218 150 - 400 K/uL   nRBC 0.0 0.0 - 0.2 %   Neutrophils Relative % 80 %   Neutro Abs 4.8 1.7 - 7.7 K/uL   Lymphocytes Relative 10 %   Lymphs Abs 0.6 (L) 0.7 - 4.0 K/uL   Monocytes Relative 9 %   Monocytes Absolute 0.5 0.1 - 1.0 K/uL   Eosinophils Relative 0 %   Eosinophils Absolute 0.0 0.0 - 0.5 K/uL   Basophils Relative  0 %   Basophils Absolute 0.0  0.0 - 0.1 K/uL   Immature Granulocytes 1 %   Abs Immature Granulocytes 0.04 0.00 - 0.07 K/uL    Comment: Performed at [facility], [place] [address] [place], [place] [address]    DG Abdomen 1 View  Result Date: 08/18/2022 CLINICAL DATA:  Nasogastric tube placement. EXAM: ABDOMEN - 1 VIEW COMPARISON:  None Available. FINDINGS: Distal tip of nasogastric tube is seen in expected position of proximal stomach. IMPRESSION: Nasogastric tube tip seen in expected position of proximal stomach. Electronically Signed   By: Marijo Conception M.D.   On: 08/18/2022 14:33   CT ABDOMEN PELVIS W CONTRAST  Result Date: 08/18/2022 CLINICAL DATA:  Colon cancer with hepatic and osseous metastatic disease, prior Y-90 radioembolization of the right hepatic lobe. Constipation, possible bowel obstruction. * Tracking Code: BO * EXAM: CT ABDOMEN AND PELVIS WITH CONTRAST TECHNIQUE: Multidetector CT imaging of the abdomen and pelvis was performed using the standard protocol following bolus administration of intravenous contrast. RADIATION DOSE REDUCTION: This exam was performed according to the departmental dose-optimization program which includes automated exposure control, adjustment of the mA and/or kV according to patient size and/or use of iterative reconstruction technique. CONTRAST:  162mL OMNIPAQUE IOHEXOL 300 MG/ML  SOLN COMPARISON:  Multiple exams, including 06/28/2022 FINDINGS: Lower chest: 2 by 3 mm right middle lobe nodule on image 5 series 6, no change from 10/30/2021. Small type 1 hiatal hernia. Trace right pleural effusion. Hepatobiliary: Probable hepatic steatosis. Multiple gallstones measuring up to 1.7 cm in diameter. The dominant posterior right hepatic lobe mass measures 10.0 by 7.5 cm on image 20 series 2, formerly 9.1 by 7.0 cm. This lesion has peripheral and some mild internal suspected enhancement likely reflecting components of viable tumor. A small satellite  lesion along the caudate lobe measures 3.0 by 2.7 cm on image 27 series 2, formerly 2.6 by 1.9 cm. A fluid density 2.0 by 1.6 cm lesion inferiorly in the right hepatic lobe on image 43 of series 2 previously measured same. 0.8 by 0.5 cm lesion in the left hepatic lobe on image 22 series 2, too small to characterize. Small suspected cyst in segment 4 of the liver near the falciform ligament on image 25 series 2, stable. Pancreas: Unremarkable Spleen: Stable fluid density lesion in the upper spleen measuring 1.4 by 1.0 cm on image 15 series 2. Adrenals/Urinary Tract: The right posterior hepatic mass abuts but does not definitively invade the right adrenal gland. No significant renal lesion. Stomach/Bowel: Dilated loops of small bowel and proximal colon extending up to the level the ascending colon mass which measures 2.4 cm in length on image 66 series 5. The colon distal to this mass is of normal caliber in the appearance is compatible with obstruction. Loops of small bowel measure up to 4 cm in diameter. Vascular/Lymphatic: Pathologic portacaval lymph node 1.9 cm in short axis on image 28 series 2, formerly 1.7 cm by my measurements. A right paracolic node close to the ascending colon mass measures 0.8 cm in short axis on image 45 series 2, previously 0.9 cm. A pathologic aortocaval node measures 1.2 cm in short axis on image 47 series 2, previously same by my measurements. Reproductive: Unremarkable Other: No supplemental non-categorized findings. Musculoskeletal: 1.8 cm rim sclerotic lesion in the L1 vertebral body similar to the prior exam IMPRESSION: 1. High-grade obstruction at the level of the ascending colon mass, with dilated loops of small bowel and proximal colon up to 4 cm in diameter. 2. Mild  enlargement of the dominant posterior right hepatic lobe mass and satellite lesion along the caudate lobe of the liver. 3. Mild enlargement of the pathologic portacaval node. Stable prominent aortocaval and right  paracolic lymph nodes. 4. Stable 1.8 cm rim sclerotic lesion in the L1 vertebral body, compatible with malignancy. 5. Stable 2 by 3 mm right middle lobe nodule. 6. Small type 1 hiatal hernia. 7. Trace right pleural effusion. 8. Cholelithiasis. 9. Probable hepatic steatosis. 10. Stable fluid density lesion in the upper spleen, probably a cyst. Electronically Signed   By: Van Clines M.D.   On: 08/18/2022 13:04   DG Abdomen 1 View  Result Date: 08/18/2022 CLINICAL DATA:  Abdominal pain, nausea and vomiting for several months, history of metastatic colon cancer EXAM: ABDOMEN - 1 VIEW COMPARISON:  07/27/2022 FINDINGS: 2 supine frontal views of the abdomen and pelvis are obtained. No bowel obstruction or ileus. No abdominal masses. Calcified gallstones again noted. No acute bony abnormalities. IMPRESSION: 1. Unremarkable bowel gas pattern. 2. Cholelithiasis. Electronically Signed   By: Randa Ngo M.D.   On: 08/18/2022 11:13   DG Chest 2 View  Result Date: 08/18/2022 CLINICAL DATA:  Abdominal pain, nausea and vomiting for several months, history of metastatic colon cancer EXAM: CHEST - 2 VIEW COMPARISON:  06/19/2021 FINDINGS: Frontal and lateral views of the chest demonstrate a stable right chest wall port. Cardiac silhouette is unremarkable. No airspace disease, effusion, or pneumothorax. No acute bony abnormalities. IMPRESSION: 1. No acute intrathoracic process. Electronically Signed   By: Randa Ngo M.D.   On: 08/18/2022 11:12    Review of Systems  Constitutional:  Negative for fever.  Gastrointestinal:  Positive for abdominal distention, abdominal pain and constipation. Negative for nausea and vomiting.  All other systems reviewed and are negative.  Blood pressure (!) 163/79, pulse 93, temperature 97.9 F (36.6 C), temperature source Oral, resp. rate 18, height 5\' 1"  (1.549 m), weight 79 kg, SpO2 100 %. Physical Exam Vitals reviewed.  Constitutional:      Appearance: Normal  appearance.  Eyes:     General: No scleral icterus. Cardiovascular:     Rate and Rhythm: Normal rate.  Pulmonary:     Effort: Pulmonary effort is normal.  Abdominal:     General: There is distension.     Tenderness: There is no abdominal tenderness. There is no rebound.     Hernia: No hernia is present.  Skin:    Capillary Refill: Capillary refill takes less than 2 seconds.  Neurological:     Mental Status: She is alert.     Assessment/Plan: Stage IV colorectal cancer Large bowel obstruction, malignant -check CEA -NG tube to decompress- she does not need urgent surgery -hydration due to elevated Cr from baseline -she has had avastin recently, guidelines recommend waiting several weeks before surgery but this may not be possible. -recommend having Dr [name] (her oncologist) see her and discuss [place]- she has progression of disease on therapy -would also ask interventional GI to evaluate for a possible stent- if unable to do that here may need transfer.  I think this would be the best option due to risk of surgery and recent chemo but dont know if this is technically possible -last option would be surgical diversion- the mass would not be resected at this point but would get a loop diversion. -will await above and plan to follow -recommend dvt prophylaxis    Rolm Bookbinder 08/18/2022, 3:19 PM

## 2022-08-18 NOTE — ED Triage Notes (Addendum)
Pt reports brown emesis, abd pain and 7 day constipation. Hx stage 4 colon cancer

## 2022-08-18 NOTE — H&P (Signed)
History and Physical    Patient: Rebecca Bullock DOB: [birth year] DOA: 08/18/2022 DOS: the patient was seen and examined on 08/18/2022 PCP: Donella Stade, PA-C  Patient coming from: Home  Chief Complaint:  Chief Complaint  Patient presents with   Constipation   HPI: Rebecca Bullock is a 57 y.o. female with medical history significant of metastatic to liver colon cancer, iron deficiency anemia, endometrial mass, seasonal allergies, vitamin D deficiency who presented to the emergency department with abdominal pain, brown-colored emesis and history of 7 days of constipation associated with decreased appetite, abdominal pain, distention, nausea and brown-colored emesis.    No melena or hematochezia.  No flank pain, dysuria, frequency or hematuria.No fever, chills or night sweats. No sore throat, rhinorrhea, dyspnea, wheezing or hemoptysis.  No chest pain, palpitations, diaphoresis, PND, orthopnea or pitting edema of the lower extremities.  No polyuria, polydipsia, polyphagia or blurred vision.  Lab work: Her CBC is her white count 6.0, hemoglobin 14.9 g/dL platelets 218.  CMP showed a glucose of 140, BUN 26 and creatinine 1.46 mg/dL.  Total protein 8.2 g/dL and alkaline phosphatase 223.  Imaging: Portable 2 view chest radiograph with no acute intrathoracic process.  1 view abdominal x-ray with cholelithiasis and unremarkable bowel gas pattern.  CT abdomen/pelvis with contrast with high-grade obstruction at the level of the ascending colon mass, with dilated loops of small bowel and proximal colon up to 4 cm in diameter.  Mild enlargement of the dominant posterior right hepatic lobe mass and satellite lesion along the caudate lobe of the liver.  Mild enlargement no pathologic portocaval nodes.  Stable 1.8 cm ring a sclerotic lesion in the L1 vertebral body, compatible with malignancy.  Stable 2 x 3 mm right middle lobe nodule.  Small type I hiatal hernia.  Trace right pleural effusion.   Cholelithiasis.  Probable hepatic asteatosis.  ED course: Initial vital signs were temperature 99.1 F, pulse 147, respirations 18, BP 128/90 mmHg O2 sat 100% on room air.  The patient received hydromorphone 1 mg IVP, morphine 2 mg IVP ondansetron 4 mg IVP, ceftriaxone 1 g IVPB and 2000 mL of normal saline bolus.  General surgery was consulted.  Case discussed with GI who did not have any further recommendations.   Review of Systems: As mentioned in the history of present illness. All other systems reviewed and are negative.  Past Medical History:  Diagnosis Date   Colon cancer metastasized to liver ([place]) 05/05/2021   Family history of lung cancer    Family history of pancreatic cancer    Goals of care, counseling/discussion 05/05/2021   Past Surgical History:  Procedure Laterality Date   IR 3D INDEPENDENT WKST  03/15/2022   IR ANGIOGRAM SELECTIVE EACH ADDITIONAL VESSEL  03/15/2022   IR ANGIOGRAM SELECTIVE EACH ADDITIONAL VESSEL  03/15/2022   IR ANGIOGRAM SELECTIVE EACH ADDITIONAL VESSEL  03/15/2022   IR ANGIOGRAM SELECTIVE EACH ADDITIONAL VESSEL  03/29/2022   IR ANGIOGRAM SELECTIVE EACH ADDITIONAL VESSEL  03/29/2022   IR ANGIOGRAM VISCERAL SELECTIVE  03/15/2022   IR ANGIOGRAM VISCERAL SELECTIVE  03/29/2022   IR EMBO ARTERIAL NOT HEMORR HEMANG INC GUIDE ROADMAPPING  03/15/2022   IR EMBO TUMOR ORGAN ISCHEMIA INFARCT INC GUIDE ROADMAPPING  03/29/2022   IR IMAGING GUIDED PORT INSERTION  05/11/2021   IR RADIOLOGIST EVAL & MGMT  02/15/2022   IR RADIOLOGIST EVAL & MGMT  04/23/2022   IR RADIOLOGIST EVAL & MGMT  06/25/2022   IR US GUIDE VASC ACCESS RIGHT  03/29/2022   uterine ablation     Social History:  reports that she has never smoked. She has never used smokeless tobacco. She reports current alcohol use. She reports that she does not currently use drugs.  Allergies  Allergen Reactions   Augmentin [Amoxicillin-Pot Clavulanate] Diarrhea    Extreme diarrhea, abdominal pain.   Irinotecan  Other (See Comments)    Flushing, tingling, visual disturbance   Metronidazole Diarrhea and Other (See Comments)   Vancomycin Rash    ?red syndrome   Tape Rash    Family History  Problem Relation Age of Onset   Lung cancer Mother    Diabetes Maternal Grandmother    Pancreatic cancer Maternal Grandfather    Multiple sclerosis Maternal Aunt    Alcohol abuse Maternal Uncle     Prior to Admission medications   Medication Sig Start Date End Date Taking? Authorizing Provider  Acetaminophen 325 MG CAPS Take 650 mg by mouth every 8 (eight) hours as needed for moderate pain or headache.    [provider]  cyclobenzaprine (FLEXERIL) 10 MG tablet Take 1 tablet (10 mg total) by mouth 3 (three) times daily as needed for muscle spasms. 07/04/22   [name], MD  diphenoxylate-atropine (LOMOTIL) 2.5-0.025 MG tablet Take 2 tablets by mouth 4 (four) times daily as needed for diarrhea or loose stools. 11/22/21   [name], MD  famotidine (PEPCID) 40 MG tablet Take 1 tablet (40 mg total) by mouth 2 (two) times daily. 08/07/22   [name], MD  fruquintinib (FRUZAQLA) 5 MG capsule Take 1 capsule (5 mg total) by mouth daily. Take for 21 days on, then off for 7 days. Repeat every 28 days. 08/13/22   [name], MD  HYDROmorphone (DILAUDID) 2 MG tablet Take 1 tablet (2 mg total) by mouth every 4 (four) hours as needed for severe pain. 07/27/22   [name], MD  loperamide (IMODIUM A-D) 2 MG tablet Take 2 mg by mouth 4 (four) times daily as needed for diarrhea or loose stools. Patient not taking: Reported on 08/13/2022    [provider]  ondansetron (ZOFRAN) 8 MG tablet Take 1 tablet (8 mg total) by mouth 2 (two) times daily. Patient not taking: Reported on 08/13/2022 07/11/22   [name], MD  Oxycodone HCl 10 MG TABS Take 1 tablet (10 mg total) by mouth every 6 (six) hours as needed for severe pain. Patient not taking: Reported on 08/13/2022 07/03/22   [name], MD  prochlorperazine (COMPAZINE) 10 MG tablet Take 1 tablet (10 mg total) by mouth every 6 (six) hours as needed for nausea or vomiting. 07/11/22   [name], MD    Physical Exam: Vitals:   08/18/22 0959 08/18/22 1030 08/18/22 1115 08/18/22 1215  BP:   (!) 139/93 (!) 134/90  Pulse:  (!) 121 (!) 130 (!) 106  Resp:  (!) 22 (!) 21 (!) 24  Temp: 99.1 F (37.3 C)     TempSrc: Rectal     SpO2:  99% 99% 98%  Weight:      Height:       Physical Exam Vitals and nursing note reviewed.  Constitutional:      Appearance: She is obese.  HENT:     Head: Normocephalic.     Nose: No rhinorrhea.     Mouth/Throat:     Mouth: Mucous membranes are dry.  Eyes:     General: No scleral icterus.  Pupils: Pupils are equal, round, and reactive to light.  Cardiovascular:     Rate and Rhythm: Normal rate and regular rhythm.  Pulmonary:     Effort: Pulmonary effort is normal.     Breath sounds: Normal breath sounds.  Abdominal:     General: Bowel sounds are normal. There is distension.     Palpations: Abdomen is soft.     Tenderness: There is no abdominal tenderness. There is no right CVA tenderness, left CVA tenderness or guarding.  Musculoskeletal:     Cervical back: Neck supple.     Right lower leg: No edema.     Left lower leg: No edema.  Skin:    General: Skin is warm and dry.  Neurological:     General: No focal deficit present.     Mental Status: She is alert and oriented to person, place, and time.  Psychiatric:        Mood and Affect: Mood normal.        Behavior: Behavior normal.     Data Reviewed:  Results are pending, will review when available.  Assessment and Plan: Principal Problem:   Bowel obstruction ([place]) Secondary to large ascending colon mass. Inpatient/MedSurg. Keep NPO. Continue NGT with LIS. Continue IV fluids. Analgesics as needed. Antiemetics as needed. Pantoprazole 40 mg IVP every 24 hours. Keep electrolytes optimized. Follow-up CBC  and CMP in AM. Follow-up imaging in the morning. General surgery input appreciated.  Active Problems:   AKI (acute kidney injury) ([place]) Observation/telemetry. Continue IV fluids. Avoid hypotension. Avoid nephrotoxins. Monitor intake and output. Monitor renal function electrolytes.    Colon cancer metastasized to liver [facility]) Follow-up with oncology as scheduled.    Advance Care Planning:   Code Status: Full Code   Consults: [place] surgery.  Family Communication:  Severity of Illness: The appropriate patient status for this patient is INPATIENT. Inpatient status is judged to be reasonable and necessary in order to provide the required intensity of service to ensure the patient's safety. The patient's presenting symptoms, physical exam findings, and initial radiographic and laboratory data in the context of their chronic comorbidities is felt to place them at high risk for further clinical deterioration. Furthermore, it is not anticipated that the patient will be medically stable for discharge from the hospital within 2 midnights of admission.   * I certify that at the point of admission it is my clinical judgment that the patient will require inpatient hospital care spanning beyond 2 midnights from the point of admission due to high intensity of service, high risk for further deterioration and high frequency of surveillance required.*  Author: Reubin Milan, MD 08/18/2022 1:48 PM  For on call review [email or web address].   This document was prepared using Dragon voice recognition software and may contain some unintended transcription errors.

## 2022-08-19 ENCOUNTER — Encounter (HOSPITAL_COMMUNITY): Payer: Self-pay | Admitting: *Deleted

## 2022-08-19 DIAGNOSIS — N179 Acute kidney failure, unspecified: Secondary | ICD-10-CM

## 2022-08-19 DIAGNOSIS — D61818 Other pancytopenia: Secondary | ICD-10-CM | POA: Insufficient documentation

## 2022-08-19 DIAGNOSIS — C189 Malignant neoplasm of colon, unspecified: Secondary | ICD-10-CM

## 2022-08-19 DIAGNOSIS — D72819 Decreased white blood cell count, unspecified: Secondary | ICD-10-CM | POA: Insufficient documentation

## 2022-08-19 DIAGNOSIS — C787 Secondary malignant neoplasm of liver and intrahepatic bile duct: Secondary | ICD-10-CM | POA: Diagnosis not present

## 2022-08-19 DIAGNOSIS — K56691 Other complete intestinal obstruction: Secondary | ICD-10-CM | POA: Diagnosis not present

## 2022-08-19 LAB — COMPREHENSIVE METABOLIC PANEL
ALT: 21 U/L (ref 0–44)
AST: 25 U/L (ref 15–41)
Albumin: 3.3 g/dL — ABNORMAL LOW (ref 3.5–5.0)
Alkaline Phosphatase: 153 U/L — ABNORMAL HIGH (ref 38–126)
Anion gap: 7 (ref 5–15)
BUN: 11 mg/dL (ref 6–20)
CO2: 24 mmol/L (ref 22–32)
Calcium: 7.8 mg/dL — ABNORMAL LOW (ref 8.9–10.3)
Chloride: 111 mmol/L (ref 98–111)
Creatinine, Ser: 0.55 mg/dL (ref 0.44–1.00)
GFR, Estimated: >60 mL/min (ref >60)
Glucose, Bld: 84 mg/dL (ref 70–99)
Potassium: 3.9 mmol/L (ref 3.5–5.1)
Sodium: 142 mmol/L (ref 135–145)
Total Bilirubin: 0.7 mg/dL (ref 0.3–1.2)
Total Protein: 6.6 g/dL (ref 6.5–8.1)

## 2022-08-19 LAB — CBC
HCT: 38.7 % (ref 36.0–46.0)
Hemoglobin: 12 g/dL (ref 12.0–15.0)
MCH: 30.5 pg (ref 26.0–34.0)
MCHC: 31 g/dL (ref 30.0–36.0)
MCV: 98.2 fL (ref 80.0–100.0)
Platelets: 133 10*3/uL — ABNORMAL LOW (ref 150–400)
RBC: 3.94 MIL/uL (ref 3.87–5.11)
RDW: 17.3 % — ABNORMAL HIGH (ref 11.5–15.5)
WBC: 3 10*3/uL — ABNORMAL LOW (ref 4.0–10.5)
nRBC: 0 % (ref 0.0–0.2)

## 2022-08-19 LAB — HIV ANTIBODY (ROUTINE TESTING W REFLEX): HIV Screen 4th Generation wRfx: NONREACTIVE

## 2022-08-19 MED ORDER — CHLORHEXIDINE GLUCONATE CLOTH 2 % EX PADS
6.0000 | MEDICATED_PAD | Freq: Every day | CUTANEOUS | Status: DC
  Administered 2022-08-19 – 2022-08-22 (×4): 6 via TOPICAL

## 2022-08-19 MED ORDER — HYDROMORPHONE HCL 1 MG/ML IJ SOLN
0.5000 mg | INTRAMUSCULAR | Status: DC | PRN
  Administered 2022-08-19 (×2): 0.5 mg via INTRAVENOUS
  Administered 2022-08-19 – 2022-08-22 (×8): 1 mg via INTRAVENOUS
  Filled 2022-08-19 (×10): qty 1

## 2022-08-19 MED ORDER — SODIUM CHLORIDE 0.9% FLUSH: 10.0000 mL | INTRAVENOUS | Status: DC | PRN

## 2022-08-19 MED ORDER — ENOXAPARIN SODIUM 40 MG/0.4ML IJ SOSY
40.0000 mg | PREFILLED_SYRINGE | INTRAMUSCULAR | Status: DC
  Administered 2022-08-19 – 2022-08-22 (×3): 40 mg via SUBCUTANEOUS
  Filled 2022-08-19 (×3): qty 0.4

## 2022-08-19 MED ORDER — ALTEPLASE 2 MG IJ SOLR
2.0000 mg | Freq: Once | INTRAMUSCULAR | Status: AC
  Administered 2022-08-19: 2 mg
  Filled 2022-08-19: qty 2

## 2022-08-19 NOTE — Progress Notes (Signed)
  Progress Note   Patient: Rebecca Bullock [id number] DOB: [birth year] DOA: 08/18/2022     1 DOS: the patient was seen and examined on 08/19/2022 at 11:05AM      Brief hospital course: Mrs. Malkiewicz is a 57 y.o. F with colon CA metastatic to liver and bone who presented with malignant colon obstruction.        Assessment and Plan: * Malignant ascending colon obstruction - Continue NG - Continue IV fluids - Continue analgesics - Consult Gen Surg, GI, Oncology, appreciate expertise    Leukopenia Due to chemo  AKI (acute kidney injury) ([place]) Cr 1.4 on admission, with fluids resolved to 0.5 baseline.  Colon cancer metastasized to liver [facility])            Subjective: Patient's belly is less distended, but she has had no bowel movements, still has abdominal pain.  No fever, no respiratory distress.     Physical Exam: BP 138/80 (BP Location: Left Arm)   Pulse 86   Temp 97.7 F (36.5 C) (Oral)   Resp 16   Ht 5\' 1"  (1.549 m)   Wt 79 kg   SpO2 100%   BMI 32.91 kg/m   Adult female, ambulating back to the bathroom, sitting up in bed, interactive and appropriate RRR, no murmurs, no peripheral edema Respiratory normal, lungs clear without rales or wheezes There are some tenderness in the right side of the abdomen, but otherwise abdomen is soft with only a little bit of guarding on the right side, no rigidity Attentive to questions, affect pleasant, judgment insight appear normal, moves all extremities with normal strength and coordination    Data Reviewed: Discussed with general surgery and gastroenterology CBC shows mild leukopenia Comprehensive metabolic panel unremarkable Total bilirubin 2.5 mostly indirect    Family Communication: Boyfriend and daughter at the bedside    Disposition: Status is: Inpatient Definitive management deferred to General Surgery        Author: Edwin Dada, MD 08/19/2022 1:14 PM  For on call review  [email or web address].

## 2022-08-19 NOTE — Hospital Course (Signed)
Rebecca Bullock is a 57 y.o. F with colon CA metastatic to liver and bone who presented with malignant colon obstruction.

## 2022-08-19 NOTE — Consult Note (Signed)
[facility] Consultation Note  Referring Provider: [facility] Primary Care Physician:  Lavada Mesi Primary Gastroenterologist:  [facility]  Reason for Consultation:  colonic obstruction, metastatic colon cancer  HPI: Rebecca Bullock is a 57 y.o. female diagnosed with metastatic colon cancer.  Has seen [facility] for diagnosis and then Dr. [name] for oncology.  Subsequently saw Dr. Jyl Heinz at [facility] for consideration of partial hepatectomy of liver metastases but felt not to be candidate.  Patient current has been on Avastin.  She has had 6 days without flatus or bowel movements, worsening abdominal distention and few days of nausea/vomiting.  Imaging studies show bowel obstruction at level of ascending colon with upstream dilatation of small bowel and cecum/proximal ascending colon (and relative decompression of more distal colon).   Past Medical History:  Diagnosis Date   Colon cancer metastasized to liver ([place]) 05/05/2021   Family history of lung cancer    Family history of pancreatic cancer    Goals of care, counseling/discussion 05/05/2021    Past Surgical History:  Procedure Laterality Date   IR 3D INDEPENDENT WKST  03/15/2022   IR ANGIOGRAM SELECTIVE EACH ADDITIONAL VESSEL  03/15/2022   IR ANGIOGRAM SELECTIVE EACH ADDITIONAL VESSEL  03/15/2022   IR ANGIOGRAM SELECTIVE EACH ADDITIONAL VESSEL  03/15/2022   IR ANGIOGRAM SELECTIVE EACH ADDITIONAL VESSEL  03/29/2022   IR ANGIOGRAM SELECTIVE EACH ADDITIONAL VESSEL  03/29/2022   IR ANGIOGRAM VISCERAL SELECTIVE  03/15/2022   IR ANGIOGRAM VISCERAL SELECTIVE  03/29/2022   IR EMBO ARTERIAL NOT HEMORR HEMANG INC GUIDE ROADMAPPING  03/15/2022   IR EMBO TUMOR ORGAN ISCHEMIA INFARCT INC GUIDE ROADMAPPING  03/29/2022   IR IMAGING GUIDED PORT INSERTION  05/11/2021   IR RADIOLOGIST EVAL & MGMT  02/15/2022   IR RADIOLOGIST EVAL & MGMT  04/23/2022   IR RADIOLOGIST EVAL & MGMT  06/25/2022   IR US GUIDE  VASC ACCESS RIGHT  03/29/2022   uterine ablation      Prior to Admission medications   Medication Sig Start Date End Date Taking? Authorizing Provider  cyclobenzaprine (FLEXERIL) 10 MG tablet Take 1 tablet (10 mg total) by mouth 3 (three) times daily as needed for muscle spasms. Patient taking differently: Take 10 mg by mouth See admin instructions. Take 10 mg by mouth in the morning and at bedtime- and an additional 10 mg once a day as needed for muscle spasms 07/04/22  Yes Ennever, [name], MD  DULCOLAX 100 MG capsule Take 100-300 mg by mouth daily as needed for mild constipation.   Yes [provider]  DULCOLAX 5 MG EC tablet Take 5 mg by mouth daily as needed for moderate constipation (if no relief from the 100 mg capsules).   Yes [provider]  famotidine (PEPCID) 40 MG tablet Take 1 tablet (40 mg total) by mouth 2 (two) times daily. Patient taking differently: Take 40 mg by mouth 2 (two) times daily as needed for heartburn or indigestion. 08/07/22  Yes Ennever, [name], MD  HYDROmorphone (DILAUDID) 2 MG tablet Take 1 tablet (2 mg total) by mouth every 4 (four) hours as needed for severe pain. Patient taking differently: Take 2 mg by mouth See admin instructions. Take 2 mg by mouth at bedtime and an additional 2 mg every four hours as needed for pain 07/27/22  Yes Ennever, [name], MD  loperamide (IMODIUM A-D) 2 MG tablet Take 2 mg by mouth 4 (four) times daily as needed for diarrhea or  loose stools.   Yes [provider]  prochlorperazine (COMPAZINE) 10 MG tablet Take 1 tablet (10 mg total) by mouth every 6 (six) hours as needed for nausea or vomiting. Patient taking differently: Take 10 mg by mouth See admin instructions. Take 10 mg by mouth prior to chemo treatments and every 6 hours as needed for nausea or vomiting 07/11/22  Yes Ennever, [name], MD  TYLENOL 8 HOUR 650 MG CR tablet Take 1,300 mg by mouth every 8 (eight) hours as needed for pain.   Yes [provider]  diphenoxylate-atropine (LOMOTIL) 2.5-0.025 MG tablet Take 2 tablets by mouth 4 (four) times daily as needed for diarrhea or loose stools. Patient not taking: Reported on 08/18/2022 11/22/21   [name], MD  fruquintinib (FRUZAQLA) 5 MG capsule Take 1 capsule (5 mg total) by mouth daily. Take for 21 days on, then off for 7 days. Repeat every 28 days. Patient not taking: Reported on 08/18/2022 08/13/22   [name], MD  ondansetron (ZOFRAN) 8 MG tablet Take 1 tablet (8 mg total) by mouth 2 (two) times daily. Patient not taking: Reported on 08/18/2022 07/11/22   [name], MD  Oxycodone HCl 10 MG TABS Take 1 tablet (10 mg total) by mouth every 6 (six) hours as needed for severe pain. Patient not taking: Reported on 08/18/2022 07/03/22   [name], MD    Current Facility-Administered Medications  Medication Dose Route Frequency Provider Last Rate Last Admin   0.9 % NaCl with KCl 20 mEq/ L  infusion   Intravenous Continuous Reubin Milan, MD 125 mL/hr at 08/19/22 0617 New Bag at 08/19/22 0617   acetaminophen (TYLENOL) tablet 650 mg  650 mg Oral Q6H PRN Reubin Milan, MD       Or   acetaminophen (TYLENOL) suppository 650 mg  650 mg Rectal Q6H PRN Reubin Milan, MD       Chlorhexidine Gluconate Cloth 2 % PADS 6 each  6 each Topical Daily Reubin Milan, MD   6 each at 08/19/22 1044   enoxaparin (LOVENOX) injection 40 mg  40 mg Subcutaneous Q24H Danford, Suann Larry, MD       HYDROmorphone (DILAUDID) injection 0.5-1 mg  0.5-1 mg Intravenous Q3H PRN Edwin Dada, MD   0.5 mg at 08/19/22 1208   ondansetron (ZOFRAN) tablet 4 mg  4 mg Oral Q6H PRN Reubin Milan, MD       Or   ondansetron [facility]) injection 4 mg  4 mg Intravenous Q6H PRN Reubin Milan, MD       sodium chloride flush (NS) 0.9 % injection 10-40 mL  10-40 mL Intracatheter PRN Reubin Milan, MD       Facility-Administered Medications Ordered in Other  Encounters  Medication Dose Route Frequency Provider Last Rate Last Admin   atropine 1 MG/ML injection            heparin lock flush 100 unit/mL  500 Units Intravenous Once [name], MD       sodium chloride flush (NS) 0.9 % injection 10 mL  10 mL Intracatheter Once PRN Celso Amy, NP       sodium chloride flush (NS) 0.9 % injection 10 mL  10 mL Intravenous PRN [name], MD   10 mL at 10/31/21 1258    Allergies as of 08/18/2022 - Review Complete 08/18/2022  Allergen Reaction Noted   Augmentin [amoxicillin-pot clavulanate] Diarrhea and Other (  See Comments) 10/31/2021   Irinotecan Other (See Comments) 05/16/2021   Metronidazole Diarrhea and Other (See Comments) 03/12/2019   Vancomycin Rash and Other (See Comments) 06/20/2021   Latex Rash 08/18/2022   Tape Rash 02/05/2022   Wound dressing adhesive Rash 02/05/2022    Family History  Problem Relation Age of Onset   Lung cancer Mother    Diabetes Maternal Grandmother    Pancreatic cancer Maternal Grandfather    Multiple sclerosis Maternal Aunt    Alcohol abuse Maternal Uncle     Social History   Socioeconomic History   Marital status: Single    Spouse name: Not on file   Number of children: Not on file   Years of education: Not on file   Highest education level: Not on file  Occupational History   Not on file  Tobacco Use   Smoking status: Never   Smokeless tobacco: Never  Vaping Use   Vaping Use: Never used  Substance and Sexual Activity   Alcohol use: Yes    Comment: occasionally   Drug use: Not Currently   Sexual activity: Not Currently  Other Topics Concern   Not on file  Social History Narrative   Not on file   Social Determinants of Health   Financial Resource Strain: Medium Risk (05/18/2021)   Overall Financial Resource Strain (CARDIA)    Difficulty of Paying Living Expenses: Somewhat hard  Food Insecurity: Patient Declined (08/18/2022)   Hunger Vital Sign    Worried About Running Out  of Food in the Last Year: Patient declined    [place] in the Last Year: Patient declined  Transportation Needs: No Transportation Needs (08/18/2022)   PRAPARE - Hydrologist (Medical): No    Lack of Transportation (Non-Medical): No  Physical Activity: Not on file  Stress: Not on file  Social Connections: Not on file  Intimate Partner Violence: Not At Risk (08/18/2022)   Humiliation, Afraid, Rape, and Kick questionnaire    Fear of Current or Ex-Partner: No    Emotionally Abused: No    Physically Abused: No    Sexually Abused: No    Review of Systems: As per HPI, all others negative  Physical Exam: Vital signs in last 24 hours: Temp:  [97.4 F (36.3 C)-98.6 F (37 C)] 97.7 F (36.5 C) (03/24 0958) Pulse Rate:  [86-104] 86 (03/24 0958) Resp:  [16-25] 16 (03/24 0958) BP: (127-163)/(65-95) 138/80 (03/24 0958) SpO2:  [97 %-100 %] 100 % (03/24 0958) Last BM Date : 08/12/22 General:   Alert,  Well-developed, well-nourished, pleasant and cooperative in NAD Head:  Normocephalic and atraumatic. Eyes:  Sclera clear, no icterus.   Conjunctiva pink. Ears:  Normal auditory acuity. Nose:  No deformity, discharge,  or lesions. NGT in place Mouth:  No deformity or lesions.  Oropharynx pink & moist. Neck:  Supple; no masses or thyromegaly. Lungs:  No respiratory distress Abdomen: Moderate distended and tympanic, and mild tenderness without peritonitis, No masses, hepatosplenomegaly or hernias noted. Normal bowel sounds, without guarding, and without rebound.     Msk:  Symmetrical without gross deformities. Normal posture. Pulses:  Normal pulses noted. Extremities:  Without clubbing or edema. Neurologic:  Alert and  oriented x4;  grossly normal neurologically. Skin:  Intact without significant lesions or rashes. Psych:  Alert and cooperative. Normal mood and affect.   Lab Results: Recent Labs    08/18/22 1049 08/19/22 0233  WBC 6.0 3.0*  HGB 14.9  12.0  HCT 45.4 38.7  PLT 218 133*   BMET Recent Labs    08/18/22 1049 08/19/22 0233  NA 137 142  K 3.7 3.9  CL 100 111  CO2 23 24  GLUCOSE 140* 84  BUN 26* 11  CREATININE 1.46* 0.55  CALCIUM 9.3 7.8*   LFT Recent Labs    08/19/22 0233  PROT 6.6  ALBUMIN 3.3*  AST 25  ALT 21  ALKPHOS 153*  BILITOT 0.7   PT/INR No results for input(s): "LABPROT", "INR" in the last 72 hours.  Studies/Results: DG Abdomen 1 View  Result Date: 08/18/2022 CLINICAL DATA:  Nasogastric tube placement. EXAM: ABDOMEN - 1 VIEW COMPARISON:  None Available. FINDINGS: Distal tip of nasogastric tube is seen in expected position of proximal stomach. IMPRESSION: Nasogastric tube tip seen in expected position of proximal stomach. Electronically Signed   By: Marijo Conception M.D.   On: 08/18/2022 14:33   CT ABDOMEN PELVIS W CONTRAST  Result Date: 08/18/2022 CLINICAL DATA:  Colon cancer with hepatic and osseous metastatic disease, prior Y-90 radioembolization of the right hepatic lobe. Constipation, possible bowel obstruction. * Tracking Code: BO * EXAM: CT ABDOMEN AND PELVIS WITH CONTRAST TECHNIQUE: Multidetector CT imaging of the abdomen and pelvis was performed using the standard protocol following bolus administration of intravenous contrast. RADIATION DOSE REDUCTION: This exam was performed according to the departmental dose-optimization program which includes automated exposure control, adjustment of the mA and/or kV according to patient size and/or use of iterative reconstruction technique. CONTRAST:  12mL OMNIPAQUE IOHEXOL 300 MG/ML  SOLN COMPARISON:  Multiple exams, including 06/28/2022 FINDINGS: Lower chest: 2 by 3 mm right middle lobe nodule on image 5 series 6, no change from 10/30/2021. Small type 1 hiatal hernia. Trace right pleural effusion. Hepatobiliary: Probable hepatic steatosis. Multiple gallstones measuring up to 1.7 cm in diameter. The dominant posterior right hepatic lobe mass measures 10.0 by  7.5 cm on image 20 series 2, formerly 9.1 by 7.0 cm. This lesion has peripheral and some mild internal suspected enhancement likely reflecting components of viable tumor. A small satellite lesion along the caudate lobe measures 3.0 by 2.7 cm on image 27 series 2, formerly 2.6 by 1.9 cm. A fluid density 2.0 by 1.6 cm lesion inferiorly in the right hepatic lobe on image 43 of series 2 previously measured same. 0.8 by 0.5 cm lesion in the left hepatic lobe on image 22 series 2, too small to characterize. Small suspected cyst in segment 4 of the liver near the falciform ligament on image 25 series 2, stable. Pancreas: Unremarkable Spleen: Stable fluid density lesion in the upper spleen measuring 1.4 by 1.0 cm on image 15 series 2. Adrenals/Urinary Tract: The right posterior hepatic mass abuts but does not definitively invade the right adrenal gland. No significant renal lesion. Stomach/Bowel: Dilated loops of small bowel and proximal colon extending up to the level the ascending colon mass which measures 2.4 cm in length on image 66 series 5. The colon distal to this mass is of normal caliber in the appearance is compatible with obstruction. Loops of small bowel measure up to 4 cm in diameter. Vascular/Lymphatic: Pathologic portacaval lymph node 1.9 cm in short axis on image 28 series 2, formerly 1.7 cm by my measurements. A right paracolic node close to the ascending colon mass measures 0.8 cm in short axis on image 45 series 2, previously 0.9 cm. A pathologic aortocaval node measures 1.2 cm in short axis on image 47 series 2, previously same  by my measurements. Reproductive: Unremarkable Other: No supplemental non-categorized findings. Musculoskeletal: 1.8 cm rim sclerotic lesion in the L1 vertebral body similar to the prior exam IMPRESSION: 1. High-grade obstruction at the level of the ascending colon mass, with dilated loops of small bowel and proximal colon up to 4 cm in diameter. 2. Mild enlargement of the  dominant posterior right hepatic lobe mass and satellite lesion along the caudate lobe of the liver. 3. Mild enlargement of the pathologic portacaval node. Stable prominent aortocaval and right paracolic lymph nodes. 4. Stable 1.8 cm rim sclerotic lesion in the L1 vertebral body, compatible with malignancy. 5. Stable 2 by 3 mm right middle lobe nodule. 6. Small type 1 hiatal hernia. 7. Trace right pleural effusion. 8. Cholelithiasis. 9. Probable hepatic steatosis. 10. Stable fluid density lesion in the upper spleen, probably a cyst. Electronically Signed   By: Van Clines M.D.   On: 08/18/2022 13:04   DG Abdomen 1 View  Result Date: 08/18/2022 CLINICAL DATA:  Abdominal pain, nausea and vomiting for several months, history of metastatic colon cancer EXAM: ABDOMEN - 1 VIEW COMPARISON:  07/27/2022 FINDINGS: 2 supine frontal views of the abdomen and pelvis are obtained. No bowel obstruction or ileus. No abdominal masses. Calcified gallstones again noted. No acute bony abnormalities. IMPRESSION: 1. Unremarkable bowel gas pattern. 2. Cholelithiasis. Electronically Signed   By: Randa Ngo M.D.   On: 08/18/2022 11:13   DG Chest 2 View  Result Date: 08/18/2022 CLINICAL DATA:  Abdominal pain, nausea and vomiting for several months, history of metastatic colon cancer EXAM: CHEST - 2 VIEW COMPARISON:  06/19/2021 FINDINGS: Frontal and lateral views of the chest demonstrate a stable right chest wall port. Cardiac silhouette is unremarkable. No airspace disease, effusion, or pneumothorax. No acute bony abnormalities. IMPRESSION: 1. No acute intrathoracic process. Electronically Signed   By: Randa Ngo M.D.   On: 08/18/2022 11:12    Impression:   Metastatic colon cancer, progressive despite chemotherapy. Large bowel obstruction at level of ascending colon mass.  No flatus or stool for 5+ days.  Plan:   Continue NPO and nasogastric tube suction. I have discussed with patient that I feel successful  colonic stent in ascending colon would be very difficult, especially as pertains to adequate visualization and ability to navigate an essentially unprepped colon all the way to the ascending colon.   Dr. Benson Norway will to see patient tomorrow to make definitive decision about whether or not colonic stent should be attempted in this situation.  If not, then palliative surgical bypass would likely be the next most viable management approach.   LOS: 1 day   Earlin Sweeden M  08/19/2022, 1:57 PM  Cell [phone] If no answer or after 5 PM call [phone]

## 2022-08-19 NOTE — Assessment & Plan Note (Signed)
Due to chemo ?

## 2022-08-19 NOTE — Assessment & Plan Note (Signed)
Location not amenable to stenting.  Primary anastomosis not possible with recent Avastin.  Gen surg will take to OR today for diverting ileostomy. - Continue NG - NPO, MIVF - Continue analgesics - Consult Gen Surg, GI, Oncology, appreciate expertise

## 2022-08-19 NOTE — Progress Notes (Signed)
Subjective: The patient is seen and examined today.  Her boyfriend was at the bedside.  She was admitted with nausea, vomiting and constipation that has been going on for around 7 days before the admission.  She has a history of metastatic colorectal cancer with metastasis to the liver was positive BRAF.  The patient is status post several treatment in the past and most recently was treated with FOLFOXIRI with Avastin discontinued in February 2024.  The patient was then treated with Lonsurf/Avastin and her last dose of Avastin was given on August 07, 2022.  She is also status post palliative radiotherapy to L1 vertebral body lesion.  She has been tolerating the treatment well.  When seen at the emergency department the patient was found to have evidence for high-grade obstruction at the level of the ascending colon mass with dilated loops of small bowel and proximal colon up to 4 cm in diameter.  There was also mild enlargement of the dominant posterior right hepatic lobe mass and satellite lesion along the caudate lobe of the liver with mild enlargement of the pathologic portacaval node.  The patient had NG tube placed and she started IV hydration.  She is feeling a little bit better but still has no bowel movement.  He has no fever or chills.  She has no chest pain, shortness of breath, cough or hemoptysis.  Objective: Vital signs in last 24 hours: Temp:  [97.4 F (36.3 C)-98.6 F (37 C)] 97.7 F (36.5 C) (03/24 0958) Pulse Rate:  [86-130] 86 (03/24 0958) Resp:  [16-25] 16 (03/24 0958) BP: (127-163)/(65-95) 138/80 (03/24 0958) SpO2:  [97 %-100 %] 100 % (03/24 0958)  Intake/Output from previous day: 03/23 0701 - 03/24 0700 In: 1411.1 [I.V.:1311.1; IV Piggyback:100] Out: 150 [Emesis/NG output:150] Intake/Output this shift: No intake/output data recorded.  General appearance: alert, cooperative, and no distress Resp: clear to auscultation bilaterally Cardio: regular rate and rhythm, S1, S2  normal, no murmur, click, rub or gallop GI: Absent bowel sound Extremities: extremities normal, atraumatic, no cyanosis or edema  Lab Results:  Recent Labs    08/18/22 1049 08/19/22 0233  WBC 6.0 3.0*  HGB 14.9 12.0  HCT 45.4 38.7  PLT 218 133*   BMET Recent Labs    08/18/22 1049 08/19/22 0233  NA 137 142  K 3.7 3.9  CL 100 111  CO2 23 24  GLUCOSE 140* 84  BUN 26* 11  CREATININE 1.46* 0.55  CALCIUM 9.3 7.8*    Studies/Results: DG Abdomen 1 View  Result Date: 08/18/2022 CLINICAL DATA:  Nasogastric tube placement. EXAM: ABDOMEN - 1 VIEW COMPARISON:  None Available. FINDINGS: Distal tip of nasogastric tube is seen in expected position of proximal stomach. IMPRESSION: Nasogastric tube tip seen in expected position of proximal stomach. Electronically Signed   By: Marijo Conception M.D.   On: 08/18/2022 14:33   CT ABDOMEN PELVIS W CONTRAST  Result Date: 08/18/2022 CLINICAL DATA:  Colon cancer with hepatic and osseous metastatic disease, prior Y-90 radioembolization of the right hepatic lobe. Constipation, possible bowel obstruction. * Tracking Code: BO * EXAM: CT ABDOMEN AND PELVIS WITH CONTRAST TECHNIQUE: Multidetector CT imaging of the abdomen and pelvis was performed using the standard protocol following bolus administration of intravenous contrast. RADIATION DOSE REDUCTION: This exam was performed according to the departmental dose-optimization program which includes automated exposure control, adjustment of the mA and/or kV according to patient size and/or use of iterative reconstruction technique. CONTRAST:  176mL OMNIPAQUE IOHEXOL 300 MG/ML  SOLN COMPARISON:  Multiple exams, including 06/28/2022 FINDINGS: Lower chest: 2 by 3 mm right middle lobe nodule on image 5 series 6, no change from 10/30/2021. Small type 1 hiatal hernia. Trace right pleural effusion. Hepatobiliary: Probable hepatic steatosis. Multiple gallstones measuring up to 1.7 cm in diameter. The dominant posterior right  hepatic lobe mass measures 10.0 by 7.5 cm on image 20 series 2, formerly 9.1 by 7.0 cm. This lesion has peripheral and some mild internal suspected enhancement likely reflecting components of viable tumor. A small satellite lesion along the caudate lobe measures 3.0 by 2.7 cm on image 27 series 2, formerly 2.6 by 1.9 cm. A fluid density 2.0 by 1.6 cm lesion inferiorly in the right hepatic lobe on image 43 of series 2 previously measured same. 0.8 by 0.5 cm lesion in the left hepatic lobe on image 22 series 2, too small to characterize. Small suspected cyst in segment 4 of the liver near the falciform ligament on image 25 series 2, stable. Pancreas: Unremarkable Spleen: Stable fluid density lesion in the upper spleen measuring 1.4 by 1.0 cm on image 15 series 2. Adrenals/Urinary Tract: The right posterior hepatic mass abuts but does not definitively invade the right adrenal gland. No significant renal lesion. Stomach/Bowel: Dilated loops of small bowel and proximal colon extending up to the level the ascending colon mass which measures 2.4 cm in length on image 66 series 5. The colon distal to this mass is of normal caliber in the appearance is compatible with obstruction. Loops of small bowel measure up to 4 cm in diameter. Vascular/Lymphatic: Pathologic portacaval lymph node 1.9 cm in short axis on image 28 series 2, formerly 1.7 cm by my measurements. A right paracolic node close to the ascending colon mass measures 0.8 cm in short axis on image 45 series 2, previously 0.9 cm. A pathologic aortocaval node measures 1.2 cm in short axis on image 47 series 2, previously same by my measurements. Reproductive: Unremarkable Other: No supplemental non-categorized findings. Musculoskeletal: 1.8 cm rim sclerotic lesion in the L1 vertebral body similar to the prior exam IMPRESSION: 1. High-grade obstruction at the level of the ascending colon mass, with dilated loops of small bowel and proximal colon up to 4 cm in diameter.  2. Mild enlargement of the dominant posterior right hepatic lobe mass and satellite lesion along the caudate lobe of the liver. 3. Mild enlargement of the pathologic portacaval node. Stable prominent aortocaval and right paracolic lymph nodes. 4. Stable 1.8 cm rim sclerotic lesion in the L1 vertebral body, compatible with malignancy. 5. Stable 2 by 3 mm right middle lobe nodule. 6. Small type 1 hiatal hernia. 7. Trace right pleural effusion. 8. Cholelithiasis. 9. Probable hepatic steatosis. 10. Stable fluid density lesion in the upper spleen, probably a cyst. Electronically Signed   By: Van Clines M.D.   On: 08/18/2022 13:04   DG Abdomen 1 View  Result Date: 08/18/2022 CLINICAL DATA:  Abdominal pain, nausea and vomiting for several months, history of metastatic colon cancer EXAM: ABDOMEN - 1 VIEW COMPARISON:  07/27/2022 FINDINGS: 2 supine frontal views of the abdomen and pelvis are obtained. No bowel obstruction or ileus. No abdominal masses. Calcified gallstones again noted. No acute bony abnormalities. IMPRESSION: 1. Unremarkable bowel gas pattern. 2. Cholelithiasis. Electronically Signed   By: Randa Ngo M.D.   On: 08/18/2022 11:13   DG Chest 2 View  Result Date: 08/18/2022 CLINICAL DATA:  Abdominal pain, nausea and vomiting for several months, history of metastatic  colon cancer EXAM: CHEST - 2 VIEW COMPARISON:  06/19/2021 FINDINGS: Frontal and lateral views of the chest demonstrate a stable right chest wall port. Cardiac silhouette is unremarkable. No airspace disease, effusion, or pneumothorax. No acute bony abnormalities. IMPRESSION: 1. No acute intrathoracic process. Electronically Signed   By: Randa Ngo M.D.   On: 08/18/2022 11:12    Medications: I have reviewed the patient's current medications.   Assessment/Plan: This is a very pleasant 57 years old white female with metastatic colorectal cancer with positive liver metastasis status post several treatment with chemotherapy  most recently treated with Lonsurf and Avastin. The patient was admitted with high-grade small bowel obstruction.  Her last dose of Avastin was given less than 2 weeks ago. She is currently undergoing conservative management with NG tube suctioning as well as IV hydration. The patient may need surgical intervention for her small bowel obstruction but because of the recent treatment with Avastin she could have risk for bleeding or healing issues.  An urgent situation this has to be done regardless of the last dose of the Avastin. Dr. [name] will see the patient tomorrow for further recommendation regarding her condition. The patient is currently stable. Thank you for taking good care of Ms. Pelter.  Please call if you have any questions.   LOS: 1 day    Eilleen Kempf 08/19/2022

## 2022-08-19 NOTE — Progress Notes (Signed)
  Transition of Care [facility]) Screening Note   Patient Details  Name: Rebecca Bullock Date of Birth: Nov 01, 1965   Transition of Care [facility]) CM/SW Contact:    Henrietta Dine, RN Phone Number: 08/19/2022, 3:38 PM    Transition of Care Department [facility]) has reviewed patient and no TOC needs have been identified at this time. We will continue to monitor patient advancement through interdisciplinary progression rounds. If new patient transition needs arise, please place a TOC consult.

## 2022-08-19 NOTE — Progress Notes (Addendum)
Subjective/Chief Complaint: No bowel function, feels better, ng in   Objective: Vital signs in last 24 hours: Temp:  [97.4 F (36.3 C)-99.1 F (37.3 C)] 97.6 F (36.4 C) (03/24 0523) Pulse Rate:  [89-147] 93 (03/24 0523) Resp:  [16-25] 16 (03/24 0523) BP: (127-163)/(65-95) 139/79 (03/24 0523) SpO2:  [97 %-100 %] 100 % (03/24 0523) Weight:  [79 kg] 79 kg (03/23 0943) Last BM Date : 08/12/22  Intake/Output from previous day: 03/23 0701 - 03/24 0700 In: 1411.1 [I.V.:1311.1; IV Piggyback:100] Out: 150 [Emesis/NG output:150] Intake/Output this shift: No intake/output data recorded.  Ab much softer, nontender   Lab Results:  Recent Labs    08/18/22 1049 08/19/22 0233  WBC 6.0 3.0*  HGB 14.9 12.0  HCT 45.4 38.7  PLT 218 133*   BMET Recent Labs    08/18/22 1049 08/19/22 0233  NA 137 142  K 3.7 3.9  CL 100 111  CO2 23 24  GLUCOSE 140* 84  BUN 26* 11  CREATININE 1.46* 0.55  CALCIUM 9.3 7.8*   PT/INR No results for input(s): "LABPROT", "INR" in the last 72 hours. ABG No results for input(s): "PHART", "HCO3" in the last 72 hours.  Invalid input(s): "PCO2", "PO2"  Studies/Results: DG Abdomen 1 View  Result Date: 08/18/2022 CLINICAL DATA:  Nasogastric tube placement. EXAM: ABDOMEN - 1 VIEW COMPARISON:  None Available. FINDINGS: Distal tip of nasogastric tube is seen in expected position of proximal stomach. IMPRESSION: Nasogastric tube tip seen in expected position of proximal stomach. Electronically Signed   By: Marijo Conception M.D.   On: 08/18/2022 14:33   CT ABDOMEN PELVIS W CONTRAST  Result Date: 08/18/2022 CLINICAL DATA:  Colon cancer with hepatic and osseous metastatic disease, prior Y-90 radioembolization of the right hepatic lobe. Constipation, possible bowel obstruction. * Tracking Code: BO * EXAM: CT ABDOMEN AND PELVIS WITH CONTRAST TECHNIQUE: Multidetector CT imaging of the abdomen and pelvis was performed using the standard protocol following bolus  administration of intravenous contrast. RADIATION DOSE REDUCTION: This exam was performed according to the departmental dose-optimization program which includes automated exposure control, adjustment of the mA and/or kV according to patient size and/or use of iterative reconstruction technique. CONTRAST:  183mL OMNIPAQUE IOHEXOL 300 MG/ML  SOLN COMPARISON:  Multiple exams, including 06/28/2022 FINDINGS: Lower chest: 2 by 3 mm right middle lobe nodule on image 5 series 6, no change from 10/30/2021. Small type 1 hiatal hernia. Trace right pleural effusion. Hepatobiliary: Probable hepatic steatosis. Multiple gallstones measuring up to 1.7 cm in diameter. The dominant posterior right hepatic lobe mass measures 10.0 by 7.5 cm on image 20 series 2, formerly 9.1 by 7.0 cm. This lesion has peripheral and some mild internal suspected enhancement likely reflecting components of viable tumor. A small satellite lesion along the caudate lobe measures 3.0 by 2.7 cm on image 27 series 2, formerly 2.6 by 1.9 cm. A fluid density 2.0 by 1.6 cm lesion inferiorly in the right hepatic lobe on image 43 of series 2 previously measured same. 0.8 by 0.5 cm lesion in the left hepatic lobe on image 22 series 2, too small to characterize. Small suspected cyst in segment 4 of the liver near the falciform ligament on image 25 series 2, stable. Pancreas: Unremarkable Spleen: Stable fluid density lesion in the upper spleen measuring 1.4 by 1.0 cm on image 15 series 2. Adrenals/Urinary Tract: The right posterior hepatic mass abuts but does not definitively invade the right adrenal gland. No significant renal lesion. Stomach/Bowel: Dilated  loops of small bowel and proximal colon extending up to the level the ascending colon mass which measures 2.4 cm in length on image 66 series 5. The colon distal to this mass is of normal caliber in the appearance is compatible with obstruction. Loops of small bowel measure up to 4 cm in diameter.  Vascular/Lymphatic: Pathologic portacaval lymph node 1.9 cm in short axis on image 28 series 2, formerly 1.7 cm by my measurements. A right paracolic node close to the ascending colon mass measures 0.8 cm in short axis on image 45 series 2, previously 0.9 cm. A pathologic aortocaval node measures 1.2 cm in short axis on image 47 series 2, previously same by my measurements. Reproductive: Unremarkable Other: No supplemental non-categorized findings. Musculoskeletal: 1.8 cm rim sclerotic lesion in the L1 vertebral body similar to the prior exam IMPRESSION: 1. High-grade obstruction at the level of the ascending colon mass, with dilated loops of small bowel and proximal colon up to 4 cm in diameter. 2. Mild enlargement of the dominant posterior right hepatic lobe mass and satellite lesion along the caudate lobe of the liver. 3. Mild enlargement of the pathologic portacaval node. Stable prominent aortocaval and right paracolic lymph nodes. 4. Stable 1.8 cm rim sclerotic lesion in the L1 vertebral body, compatible with malignancy. 5. Stable 2 by 3 mm right middle lobe nodule. 6. Small type 1 hiatal hernia. 7. Trace right pleural effusion. 8. Cholelithiasis. 9. Probable hepatic steatosis. 10. Stable fluid density lesion in the upper spleen, probably a cyst. Electronically Signed   By: Van Clines M.D.   On: 08/18/2022 13:04   DG Abdomen 1 View  Result Date: 08/18/2022 CLINICAL DATA:  Abdominal pain, nausea and vomiting for several months, history of metastatic colon cancer EXAM: ABDOMEN - 1 VIEW COMPARISON:  07/27/2022 FINDINGS: 2 supine frontal views of the abdomen and pelvis are obtained. No bowel obstruction or ileus. No abdominal masses. Calcified gallstones again noted. No acute bony abnormalities. IMPRESSION: 1. Unremarkable bowel gas pattern. 2. Cholelithiasis. Electronically Signed   By: Randa Ngo M.D.   On: 08/18/2022 11:13   DG Chest 2 View  Result Date: 08/18/2022 CLINICAL DATA:  Abdominal  pain, nausea and vomiting for several months, history of metastatic colon cancer EXAM: CHEST - 2 VIEW COMPARISON:  06/19/2021 FINDINGS: Frontal and lateral views of the chest demonstrate a stable right chest wall port. Cardiac silhouette is unremarkable. No airspace disease, effusion, or pneumothorax. No acute bony abnormalities. IMPRESSION: 1. No acute intrathoracic process. Electronically Signed   By: Randa Ngo M.D.   On: 08/18/2022 11:12    Anti-infectives: Anti-infectives (From admission, onward)    Start     Dose/Rate Route Frequency Ordered Stop   08/18/22 1015  cefTRIAXone (ROCEPHIN) 1 g in sodium chloride 0.9 % 100 mL IVPB        1 g 200 mL/hr over 30 Minutes Intravenous  Once 08/18/22 1012 08/18/22 1144       Assessment/Plan: Stage IV colorectal cancer Large bowel obstruction, malignant -check CEA -NG tube to decompress- she does not need urgent surgery -Cr now normalized -she has had avastin recently, guidelines recommend waiting several weeks (4-6) before surgery but this may not be possible. -dont think a stent is option but would like GI to evaluate and see her -have sent her oncologist a message and will await a reply as well -last option would be surgical diversion- I don't think this is resectable or needs to be but could do a  small bowel to transverse colon bypass or a loop ileostomy (although IC valve is there).  Ileostomy if she indeed has had avastin in last couple weeks may be best option -will await above and plan to follow -recommend dvt prophylaxis  I reviewed ED provider notes, hospitalist notes, last 24 h vitals and pain scores, last 48 h intake and output, last 24 h labs and trends, and last 24 h imaging results.I reviewed bmet (cr now normal), albumin is 3.3, wbc is low at 3 as well.     Rolm Bookbinder 08/19/2022

## 2022-08-19 NOTE — Assessment & Plan Note (Signed)
Cr 1.4 on admission, with fluids resolved to 0.5 baseline.

## 2022-08-20 ENCOUNTER — Encounter: Payer: Self-pay | Admitting: Radiation Oncology

## 2022-08-20 DIAGNOSIS — D72819 Decreased white blood cell count, unspecified: Secondary | ICD-10-CM | POA: Diagnosis not present

## 2022-08-20 DIAGNOSIS — C787 Secondary malignant neoplasm of liver and intrahepatic bile duct: Secondary | ICD-10-CM | POA: Diagnosis not present

## 2022-08-20 DIAGNOSIS — K56691 Other complete intestinal obstruction: Secondary | ICD-10-CM | POA: Diagnosis not present

## 2022-08-20 DIAGNOSIS — N179 Acute kidney failure, unspecified: Secondary | ICD-10-CM | POA: Diagnosis not present

## 2022-08-20 DIAGNOSIS — E669 Obesity, unspecified: Secondary | ICD-10-CM | POA: Insufficient documentation

## 2022-08-20 DIAGNOSIS — C189 Malignant neoplasm of colon, unspecified: Secondary | ICD-10-CM | POA: Diagnosis not present

## 2022-08-20 LAB — CBC
HCT: 30.9 % — ABNORMAL LOW (ref 36.0–46.0)
Hemoglobin: 9.9 g/dL — ABNORMAL LOW (ref 12.0–15.0)
MCH: 30.8 pg (ref 26.0–34.0)
MCHC: 32 g/dL (ref 30.0–36.0)
MCV: 96.3 fL (ref 80.0–100.0)
Platelets: 102 10*3/uL — ABNORMAL LOW (ref 150–400)
RBC: 3.21 MIL/uL — ABNORMAL LOW (ref 3.87–5.11)
RDW: 16.5 % — ABNORMAL HIGH (ref 11.5–15.5)
WBC: 2.5 10*3/uL — ABNORMAL LOW (ref 4.0–10.5)
nRBC: 0 % (ref 0.0–0.2)

## 2022-08-20 LAB — COMPREHENSIVE METABOLIC PANEL
ALT: 15 U/L (ref 0–44)
AST: 21 U/L (ref 15–41)
Albumin: 3.2 g/dL — ABNORMAL LOW (ref 3.5–5.0)
Alkaline Phosphatase: 134 U/L — ABNORMAL HIGH (ref 38–126)
Anion gap: 10 (ref 5–15)
BUN: <5 mg/dL — ABNORMAL LOW (ref 6–20)
CO2: 21 mmol/L — ABNORMAL LOW (ref 22–32)
Calcium: 7.4 mg/dL — ABNORMAL LOW (ref 8.9–10.3)
Chloride: 106 mmol/L (ref 98–111)
Creatinine, Ser: 0.34 mg/dL — ABNORMAL LOW (ref 0.44–1.00)
GFR, Estimated: >60 mL/min (ref >60)
Glucose, Bld: 79 mg/dL (ref 70–99)
Potassium: 3.9 mmol/L (ref 3.5–5.1)
Sodium: 137 mmol/L (ref 135–145)
Total Bilirubin: 0.9 mg/dL (ref 0.3–1.2)
Total Protein: 5.9 g/dL — ABNORMAL LOW (ref 6.5–8.1)

## 2022-08-20 MED ORDER — BISACODYL 10 MG RE SUPP
10.0000 mg | Freq: Two times a day (BID) | RECTAL | Status: DC
  Administered 2022-08-20: 10 mg via RECTAL
  Filled 2022-08-20: qty 1

## 2022-08-20 MED ORDER — KCL IN DEXTROSE-NACL 20-5-0.9 MEQ/L-%-% IV SOLN
INTRAVENOUS | Status: DC
  Filled 2022-08-20 (×5): qty 1000

## 2022-08-20 NOTE — Progress Notes (Signed)
UNASSIGNED PATIENT Subjective: Rebecca Bullock is a 57 year old white female with history of metastatic colon cancer who presented with obstructive symptoms including increasing distention of the abdomen with no bowel movements for several days prior to admission.  CT scan of the abdomen pelvis done on 08/18/2022 revealed high-grade obstruction to the level of the ascending colon mass with dilated loops of small bowel and proximal colon measuring up to 4 cm in size.  Mild enlargement of the dominant posterior right hepatic lobe mass and satellite lesion along the caudate lobe of the liver was noted.  Mild enlargement of the pathologic portacaval nodes and prominence stable aortocaval and right paracolic lymph nodes were noted.  Patient had a stable 1.8 cm rim sclerotic region of the L1 body compatible with malignant metastatic disease.  Also noted was hepatic steatosis along with cholelithiasis.  Patient seems to be laying comfortably in bed with NG tube in place.  Objective: Vital signs in last 24 hours: Temp:  [97.6 F (36.4 C)-98.2 F (36.8 C)] 97.7 F (36.5 C) (03/25 1341) Pulse Rate:  [90-99] 93 (03/25 1341) Resp:  [14-20] 14 (03/25 1341) BP: (137-155)/(74-81) 137/74 (03/25 1341) SpO2:  [99 %-100 %] 99 % (03/25 1341) Last BM Date : 08/12/22  Intake/Output from previous day: 03/24 0701 - 03/25 0700 In: 3410.6 [I.V.:3410.6] Out: 250 [Emesis/NG output:250] Intake/Output this shift: Total I/O In: 0  Out: 50 [Emesis/NG output:50]  GPE not done   Lab Results: Recent Labs    08/18/22 1049 08/19/22 0233 08/20/22 0239  WBC 6.0 3.0* 2.5*  HGB 14.9 12.0 9.9*  HCT 45.4 38.7 30.9*  PLT 218 133* 102*   BMET Recent Labs    08/18/22 1049 08/19/22 0233 08/20/22 0239  NA 137 142 137  K 3.7 3.9 3.9  CL 100 111 106  CO2 23 24 21*  GLUCOSE 140* 84 79  BUN 26* 11 <5*  CREATININE 1.46* 0.55 0.34*  CALCIUM 9.3 7.8* 7.4*   LFT Recent Labs    08/20/22 0239  PROT 5.9*  ALBUMIN  3.2*  AST 21  ALT 15  ALKPHOS 134*  BILITOT 0.9   Studies/Results: DG Abdomen 1 View  Result Date: 08/18/2022 CLINICAL DATA:  Nasogastric tube placement. EXAM: ABDOMEN - 1 VIEW COMPARISON:  None Available. FINDINGS: Distal tip of nasogastric tube is seen in expected position of proximal stomach. IMPRESSION: Nasogastric tube tip seen in expected position of proximal stomach. Electronically Signed   By: Marijo Conception M.D.   On: 08/18/2022 14:33    Medications: I have reviewed the patient's current medications. Prior to Admission:  Facility-Administered Medications Prior to Admission  Medication Dose Route Frequency Provider Last Rate Last Admin   ondansetron (ZOFRAN) injection 8 mg  8 mg Intravenous Once [name], MD       Medications Prior to Admission  Medication Sig Dispense Refill Last Dose   cyclobenzaprine (FLEXERIL) 10 MG tablet Take 1 tablet (10 mg total) by mouth 3 (three) times daily as needed for muscle spasms. (Patient taking differently: Take 10 mg by mouth See admin instructions. Take 10 mg by mouth in the morning and at bedtime- and an additional 10 mg once a day as needed for muscle spasms) 90 tablet 3 08/17/2022   DULCOLAX 100 MG capsule Take 100-300 mg by mouth daily as needed for mild constipation.   unk   DULCOLAX 5 MG EC tablet Take 5 mg by mouth daily as needed for moderate constipation (if no relief from the  100 mg capsules).   unk   famotidine (PEPCID) 40 MG tablet Take 1 tablet (40 mg total) by mouth 2 (two) times daily. (Patient taking differently: Take 40 mg by mouth 2 (two) times daily as needed for heartburn or indigestion.) 60 tablet 4 unk   HYDROmorphone (DILAUDID) 2 MG tablet Take 1 tablet (2 mg total) by mouth every 4 (four) hours as needed for severe pain. (Patient taking differently: Take 2 mg by mouth See admin instructions. Take 2 mg by mouth at bedtime and an additional 2 mg every four hours as needed for pain) 60 tablet 0 08/15/2022 at pm    loperamide (IMODIUM A-D) 2 MG tablet Take 2 mg by mouth 4 (four) times daily as needed for diarrhea or loose stools.   unk   prochlorperazine (COMPAZINE) 10 MG tablet Take 1 tablet (10 mg total) by mouth every 6 (six) hours as needed for nausea or vomiting. (Patient taking differently: Take 10 mg by mouth See admin instructions. Take 10 mg by mouth prior to chemo treatments and every 6 hours as needed for nausea or vomiting) 60 tablet 0 08/13/2022   TYLENOL 8 HOUR 650 MG CR tablet Take 1,300 mg by mouth every 8 (eight) hours as needed for pain.   unk   fruquintinib (FRUZAQLA) 5 MG capsule Take 1 capsule (5 mg total) by mouth daily. Take for 21 days on, then off for 7 days. Repeat every 28 days. (Patient not taking: Reported on 08/18/2022) 21 capsule 4 Not Taking   Scheduled:  bisacodyl  10 mg Rectal BID   Chlorhexidine Gluconate Cloth  6 each Topical Daily   enoxaparin (LOVENOX) injection  40 mg Subcutaneous Q24H   Continuous:  dextrose 5 % and 0.9 % NaCl with KCl 20 mEq/L 125 mL/hr at 08/20/22 1110   [id number] **OR** acetaminophen, HYDROmorphone (DILAUDID) injection, ondansetron **OR** ondansetron (ZOFRAN) IV, sodium chloride flush  Assessment/Plan: 1) High-grade obstruction of the ascending colon the level of the mass with dilated proximal small bowel and colonic loops-this patient is not a candidate for colonic stenting due to the location of her obstruction. Continue supportive care. 2) Advanced colon cancer with hepatic and bony mets. 3) Cholelithiasis. 4) Hepatic steatosis.   LOS: 2 days   Juanita Craver 08/20/2022, 1:52 PM

## 2022-08-20 NOTE — Consult Note (Signed)
Rebecca Bullock is well-known to me.  She is very nice 57 year old white female.  She has metastatic colon cancer.  She has been through multiple courses of chemotherapy.  She does have the BRAF mutation.  She has had targeted therapy in the past.  She has been seen at [facility] for the possibility of surgical resection.  Unfortunately, they did not feel that she was a candidate.  She has had intrahepatic therapy with Y90.  She most recently had been on Lonsurf/Avastin.  She last got Avastin on 08/07/2022.  She really had a hard time with the Lonsurf.    She has had problems with constipation and diarrhea.  I know that we have noted in the past that she has had partial bowel obstruction.  She had a plain film the abdomen couple weeks ago.  She not wish to go to the hospital.  We treated her in the office and she actually got much better.  However, she now is admitted on 08/18/2022.  She came in because of abdominal Bullock and vomiting.  She not had a bowel movement for 6 days.  She took some laxatives which did not help.  She has CT scan of the abdomen pelvis that was done.  This was done on 08/18/2022.  This did show that there is a high-grade obstruction in the ascending colon.  She has been seen by surgery.  She has been seen by Gastroenterology.  Unfortunately, there is not much that they can really do right now.  However, Gastroenterology might want to try a stent.  This might be difficult to do.  Patient has an NG tube in right now.  Her labs show white count 2.5.  Hemoglobin 9.9.  Platelet count 102,000.  This might be the effect from [place] that she had taken.  Her sodium 137.  Potassium 3.9.  BUN less than 5 creatinine 0.34.  Calcium 7.4 with an albumin of 3.2.  She has had no obvious bleeding..  She is urinating okay.  There is been no fever.   Her vital signs show temperature of 97.6.  Pulse 90.  Blood pressure 144/81.  Her head neck exam shows no ocular or oral lesions.  There are no palpable  cervical or supraclavicular lymph nodes.  Lungs are clear bilaterally.  Cardiac exam regular rate and rhythm.  No murmurs.  Abdomen is soft.  Bowel sounds are present.  She has no guarding or rebound tenderness.  She has no obvious fluid wave.  Extremity shows no clubbing, cyanosis or edema.  Neurological exam is nonfocal.   Rebecca Bullock has a metastatic colorectal cancer.  She now has obstruction.  I understand the difficulties that are presented here.  She had Avastin 2 weeks ago.  I can understand surgery not wanting to operate on her because of the difficulties with healing post Avastin.  I do not know if Gastroenterology can do a stent.  I suppose that we could try some chemotherapy on her again.  Again she has been through a lot of chemotherapy.  I will try her on a rectal suppository.  Maybe, this might help a little bit.  I do appreciate everybody's help.  I know everybody is working really hard to try to get her better.  We are looking at quality of life.  I would like to start her on the new oral agent- fruquintinib -.  She has not yet started this.  We will follow along.  I know the staff on [address] are doing a fantastic job with her.  Lattie Haw, MD  Lurena Joiner 23:13

## 2022-08-20 NOTE — Progress Notes (Signed)
  Progress Note   Patient: Rebecca Bullock [id number] DOB: [birth year] DOA: 08/18/2022     2 DOS: the patient was seen and examined on 08/20/2022 at 9:45AM      Brief hospital course: Mrs. Ehresmann is a 57 y.o. F with colon CA metastatic to liver and bone who presented with malignant colon obstruction.      Assessment and Plan: * Malignant ascending colon obstruction - Continue NG - NPO, IVF - Change to D5 NS K - Continue analgesics - Consult Gen Surg, GI, Oncology, appreciate expertise    Pancytopenia No clinical bleeding - Follow CBC.  AKI (acute kidney injury) ([place]) Cr 1.4 on admission, with fluids resolved to 0.5 baseline.  Colon cancer metastasized to liver [facility])            Subjective: Flatus overnight, no BM, no confusion, no respiratory symptoms, no nursing concerns.     Physical Exam: BP (!) 144/81 (BP Location: Right Arm)   Pulse 90   Temp 97.6 F (36.4 C) (Oral)   Resp 16   Ht 5\' 1"  (1.549 m)   Wt 79 kg   SpO2 99%   BMI 32.91 kg/m   Adult female, sitting up in bed, interactive and appropriate Nasogastric tube in place RRR, no murmurs, no peripheral edema Respiratory normal, lungs clear without rales or wheezes Abdomen soft, mild nonfocal tenderness, no rigidity, no ascites.  Mild distention Attention normal, affect appropriate, judgment insight appear normal    Data Reviewed: General surgery notes reviewed Patient metabolic panel reviewed, unremarkable CBC shows pancytopenia, expected in setting of chemotherapy   Family Communication: None present    Disposition: Status is: Inpatient         Author: Edwin Dada, MD 08/20/2022 11:26 AM  For on call review [email or web address].

## 2022-08-20 NOTE — Assessment & Plan Note (Signed)
BMI 32 

## 2022-08-20 NOTE — Consult Note (Addendum)
[facility] Nurse requested for preoperative stoma site marking  Discussed surgical procedure and stoma creation with patient.  Explained role of the [place] nurse team. Provided the patient with educational booklet and provided samples of pouching options. Answered patient's questions.   Examined patient lying, sitting, and standing in order to place the marking in the patient's visual field, away from any creases or abdominal contour issues and within the rectus muscle. Attempted to mark below the patient's belt line, but this was not possible, since a significant crease occurs lower on the abd when the patient leans forward which should be avoided if possible.   Marked for colostomy in the LLQ  ___7_ cm to the left of the umbilicus and [id number] above the umbilicus.  Marked for ileostomy in the RLQ  __7__cm to the right of the umbilicus and  [id number] cm above the umbilicus.  Patient's abdomen cleansed with CHG wipes at site markings, allowed to air dry prior to marking. Pt plans to have surgery tomorrow so thin film was not applied. [facility] Nurse team will follow up with patient after surgery for continued ostomy care and teaching if she receives an ostomy.  Thank-you,  Julien Girt MSN, [place], [place], [place], [place]

## 2022-08-20 NOTE — Progress Notes (Signed)
Progress Note     Subjective: Pt reports she has passed some flatus but no stool. No abdominal pain this AM and abdomen is less distended since NGT placement. Discussed options of possible stent vs OR to relieve obstruction with possibility of second surgery if she requires operation acutely.   Objective: Vital signs in last 24 hours: Temp:  [97.6 F (36.4 C)-98.2 F (36.8 C)] 97.6 F (36.4 C) (03/25 0407) Pulse Rate:  [86-99] 90 (03/25 0407) Resp:  [14-20] 16 (03/25 0407) BP: (138-155)/(76-81) 144/81 (03/25 0407) SpO2:  [99 %-100 %] 99 % (03/25 0407) Last BM Date : 08/12/22  Intake/Output from previous day: 03/24 0701 - 03/25 0700 In: 3410.6 [I.V.:3410.6] Out: 250 [Emesis/NG output:250] Intake/Output this shift: No intake/output data recorded.  PE: General: pleasant, WD, WN female who is laying in bed in NAD Heart: regular, rate, and rhythm.   Lungs: CTAB, no wheezes, rhonchi, or rales noted.  Respiratory effort nonlabored Abd: soft, NT, mild distention, NGT with thin bilious drainage MS: all 4 extremities are symmetrical with no cyanosis, clubbing, or edema. Psych: A&Ox3 with an appropriate affect.    Lab Results:  Recent Labs    08/19/22 0233 08/20/22 0239  WBC 3.0* 2.5*  HGB 12.0 9.9*  HCT 38.7 30.9*  PLT 133* 102*   BMET Recent Labs    08/19/22 0233 08/20/22 0239  NA 142 137  K 3.9 3.9  CL 111 106  CO2 24 21*  GLUCOSE 84 79  BUN 11 <5*  CREATININE 0.55 0.34*  CALCIUM 7.8* 7.4*   PT/INR No results for input(s): "LABPROT", "INR" in the last 72 hours. CMP     Component Value Date/Time   NA 137 08/20/2022 0239   K 3.9 08/20/2022 0239   CL 106 08/20/2022 0239   CO2 21 (L) 08/20/2022 0239   GLUCOSE 79 08/20/2022 0239   BUN <5 (L) 08/20/2022 0239   CREATININE 0.34 (L) 08/20/2022 0239   CREATININE 0.48 08/13/2022 1235   CREATININE 0.56 03/23/2021 0000   CALCIUM 7.4 (L) 08/20/2022 0239   PROT 5.9 (L) 08/20/2022 0239   ALBUMIN 3.2 (L)  08/20/2022 0239   AST 21 08/20/2022 0239   AST 31 08/13/2022 1235   ALT 15 08/20/2022 0239   ALT 19 08/13/2022 1235   ALKPHOS 134 (H) 08/20/2022 0239   BILITOT 0.9 08/20/2022 0239   BILITOT 0.3 08/13/2022 1235   GFRNONAA >60 08/20/2022 0239   GFRNONAA >60 08/13/2022 1235   GFRNONAA 102 06/24/2018 1007   GFRAA 119 06/24/2018 1007   Lipase  No results found for: "LIPASE"     Studies/Results: DG Abdomen 1 View  Result Date: 08/18/2022 CLINICAL DATA:  Nasogastric tube placement. EXAM: ABDOMEN - 1 VIEW COMPARISON:  None Available. FINDINGS: Distal tip of nasogastric tube is seen in expected position of proximal stomach. IMPRESSION: Nasogastric tube tip seen in expected position of proximal stomach. Electronically Signed   By: Marijo Conception M.D.   On: 08/18/2022 14:33   CT ABDOMEN PELVIS W CONTRAST  Result Date: 08/18/2022 CLINICAL DATA:  Colon cancer with hepatic and osseous metastatic disease, prior Y-90 radioembolization of the right hepatic lobe. Constipation, possible bowel obstruction. * Tracking Code: BO * EXAM: CT ABDOMEN AND PELVIS WITH CONTRAST TECHNIQUE: Multidetector CT imaging of the abdomen and pelvis was performed using the standard protocol following bolus administration of intravenous contrast. RADIATION DOSE REDUCTION: This exam was performed according to the departmental dose-optimization program which includes automated exposure control, adjustment of the  mA and/or kV according to patient size and/or use of iterative reconstruction technique. CONTRAST:  166mL OMNIPAQUE IOHEXOL 300 MG/ML  SOLN COMPARISON:  Multiple exams, including 06/28/2022 FINDINGS: Lower chest: 2 by 3 mm right middle lobe nodule on image 5 series 6, no change from 10/30/2021. Small type 1 hiatal hernia. Trace right pleural effusion. Hepatobiliary: Probable hepatic steatosis. Multiple gallstones measuring up to 1.7 cm in diameter. The dominant posterior right hepatic lobe mass measures 10.0 by 7.5 cm on  image 20 series 2, formerly 9.1 by 7.0 cm. This lesion has peripheral and some mild internal suspected enhancement likely reflecting components of viable tumor. A small satellite lesion along the caudate lobe measures 3.0 by 2.7 cm on image 27 series 2, formerly 2.6 by 1.9 cm. A fluid density 2.0 by 1.6 cm lesion inferiorly in the right hepatic lobe on image 43 of series 2 previously measured same. 0.8 by 0.5 cm lesion in the left hepatic lobe on image 22 series 2, too small to characterize. Small suspected cyst in segment 4 of the liver near the falciform ligament on image 25 series 2, stable. Pancreas: Unremarkable Spleen: Stable fluid density lesion in the upper spleen measuring 1.4 by 1.0 cm on image 15 series 2. Adrenals/Urinary Tract: The right posterior hepatic mass abuts but does not definitively invade the right adrenal gland. No significant renal lesion. Stomach/Bowel: Dilated loops of small bowel and proximal colon extending up to the level the ascending colon mass which measures 2.4 cm in length on image 66 series 5. The colon distal to this mass is of normal caliber in the appearance is compatible with obstruction. Loops of small bowel measure up to 4 cm in diameter. Vascular/Lymphatic: Pathologic portacaval lymph node 1.9 cm in short axis on image 28 series 2, formerly 1.7 cm by my measurements. A right paracolic node close to the ascending colon mass measures 0.8 cm in short axis on image 45 series 2, previously 0.9 cm. A pathologic aortocaval node measures 1.2 cm in short axis on image 47 series 2, previously same by my measurements. Reproductive: Unremarkable Other: No supplemental non-categorized findings. Musculoskeletal: 1.8 cm rim sclerotic lesion in the L1 vertebral body similar to the prior exam IMPRESSION: 1. High-grade obstruction at the level of the ascending colon mass, with dilated loops of small bowel and proximal colon up to 4 cm in diameter. 2. Mild enlargement of the dominant  posterior right hepatic lobe mass and satellite lesion along the caudate lobe of the liver. 3. Mild enlargement of the pathologic portacaval node. Stable prominent aortocaval and right paracolic lymph nodes. 4. Stable 1.8 cm rim sclerotic lesion in the L1 vertebral body, compatible with malignancy. 5. Stable 2 by 3 mm right middle lobe nodule. 6. Small type 1 hiatal hernia. 7. Trace right pleural effusion. 8. Cholelithiasis. 9. Probable hepatic steatosis. 10. Stable fluid density lesion in the upper spleen, probably a cyst. Electronically Signed   By: Van Clines M.D.   On: 08/18/2022 13:04   DG Abdomen 1 View  Result Date: 08/18/2022 CLINICAL DATA:  Abdominal pain, nausea and vomiting for several months, history of metastatic colon cancer EXAM: ABDOMEN - 1 VIEW COMPARISON:  07/27/2022 FINDINGS: 2 supine frontal views of the abdomen and pelvis are obtained. No bowel obstruction or ileus. No abdominal masses. Calcified gallstones again noted. No acute bony abnormalities. IMPRESSION: 1. Unremarkable bowel gas pattern. 2. Cholelithiasis. Electronically Signed   By: Randa Ngo M.D.   On: 08/18/2022 11:13   DG  Chest 2 View  Result Date: 08/18/2022 CLINICAL DATA:  Abdominal pain, nausea and vomiting for several months, history of metastatic colon cancer EXAM: CHEST - 2 VIEW COMPARISON:  06/19/2021 FINDINGS: Frontal and lateral views of the chest demonstrate a stable right chest wall port. Cardiac silhouette is unremarkable. No airspace disease, effusion, or pneumothorax. No acute bony abnormalities. IMPRESSION: 1. No acute intrathoracic process. Electronically Signed   By: Randa Ngo M.D.   On: 08/18/2022 11:12    Anti-infectives: Anti-infectives (From admission, onward)    Start     Dose/Rate Route Frequency Ordered Stop   08/18/22 1015  cefTRIAXone (ROCEPHIN) 1 g in sodium chloride 0.9 % 100 mL IVPB        1 g 200 mL/hr over 30 Minutes Intravenous  Once 08/18/22 1012 08/18/22 1144         Assessment/Plan  LBO secondary to stage IV colorectal cancer with ascending colon mass - continue NGT to LIWS - Dr. Paulita Fujita saw yesterday and thought stent would be very difficult and recommended Dr. Benson Norway to eval for second opinion, appreciate GI input - Dr. [name] following as well, appreciate oncology - last dose Avastin 2 weeks ago, guidelines recommend 4-6 weeks off prior to elective surgery - high risk for leak and poor healing - if unable to undergo stenting would recommend lap vs open loop ileostomy to relieve obstruction and holding off on Avastin acutely. Then if patient recovers reasonably well could consider palliative bypass with ileostomy reversal in a few weeks  - CEA was elevated at 12 3/18 - WOC consult today for marking in case GI feels that stent is not a possibility  FEN: NPO, NGT, IVF @125  cc/h VTE: LMWH ID: no current abx  LOS: 2 days   I reviewed Consultant GI/ONC notes, hospitalist notes, last 24 h vitals and pain scores, last 48 h intake and output, last 24 h labs and trends, and last 24 h imaging results.    Norm Parcel, [facility] Surgery 08/20/2022, 9:27 AM Please see Amion for pager number during day hours 7:00am-4:30pm

## 2022-08-21 ENCOUNTER — Other Ambulatory Visit: Payer: Self-pay

## 2022-08-21 ENCOUNTER — Encounter (HOSPITAL_COMMUNITY)
Admission: EM | Disposition: A | Discharge status: Home / Self Care | Payer: Self-pay | Source: Home / Self Care | Attending: Family Medicine

## 2022-08-21 ENCOUNTER — Inpatient Hospital Stay (HOSPITAL_COMMUNITY): Payer: Medicaid Other | Admitting: Certified Registered"

## 2022-08-21 ENCOUNTER — Encounter (HOSPITAL_COMMUNITY): Payer: Self-pay | Admitting: *Deleted

## 2022-08-21 DIAGNOSIS — K565 Intestinal adhesions [bands], unspecified as to partial versus complete obstruction: Secondary | ICD-10-CM

## 2022-08-21 DIAGNOSIS — K56691 Other complete intestinal obstruction: Secondary | ICD-10-CM | POA: Diagnosis not present

## 2022-08-21 DIAGNOSIS — C19 Malignant neoplasm of rectosigmoid junction: Secondary | ICD-10-CM

## 2022-08-21 DIAGNOSIS — D72819 Decreased white blood cell count, unspecified: Secondary | ICD-10-CM | POA: Diagnosis not present

## 2022-08-21 DIAGNOSIS — K56609 Unspecified intestinal obstruction, unspecified as to partial versus complete obstruction: Secondary | ICD-10-CM

## 2022-08-21 DIAGNOSIS — N179 Acute kidney failure, unspecified: Secondary | ICD-10-CM | POA: Diagnosis not present

## 2022-08-21 HISTORY — PX: LAPAROSCOPY: SHX197

## 2022-08-21 LAB — CBC
HCT: 31.5 % — ABNORMAL LOW (ref 36.0–46.0)
Hemoglobin: 10.1 g/dL — ABNORMAL LOW (ref 12.0–15.0)
MCH: 30.1 pg (ref 26.0–34.0)
MCHC: 32.1 g/dL (ref 30.0–36.0)
MCV: 93.8 fL (ref 80.0–100.0)
Platelets: 127 10*3/uL — ABNORMAL LOW (ref 150–400)
RBC: 3.36 MIL/uL — ABNORMAL LOW (ref 3.87–5.11)
RDW: 16.1 % — ABNORMAL HIGH (ref 11.5–15.5)
WBC: 2.7 10*3/uL — ABNORMAL LOW (ref 4.0–10.5)
nRBC: 0 % (ref 0.0–0.2)

## 2022-08-21 LAB — COMPREHENSIVE METABOLIC PANEL
ALT: 14 U/L (ref 0–44)
AST: 19 U/L (ref 15–41)
Albumin: 3.2 g/dL — ABNORMAL LOW (ref 3.5–5.0)
Alkaline Phosphatase: 138 U/L — ABNORMAL HIGH (ref 38–126)
Anion gap: 6 (ref 5–15)
BUN: <5 mg/dL — ABNORMAL LOW (ref 6–20)
CO2: 24 mmol/L (ref 22–32)
Calcium: 7.7 mg/dL — ABNORMAL LOW (ref 8.9–10.3)
Chloride: 105 mmol/L (ref 98–111)
Creatinine, Ser: 0.36 mg/dL — ABNORMAL LOW (ref 0.44–1.00)
GFR, Estimated: >60 mL/min (ref >60)
Glucose, Bld: 120 mg/dL — ABNORMAL HIGH (ref 70–99)
Potassium: 3.8 mmol/L (ref 3.5–5.1)
Sodium: 135 mmol/L (ref 135–145)
Total Bilirubin: 0.6 mg/dL (ref 0.3–1.2)
Total Protein: 6.3 g/dL — ABNORMAL LOW (ref 6.5–8.1)

## 2022-08-21 SURGERY — LAPAROSCOPY DIAGNOSTIC
Anesthesia: General

## 2022-08-21 MED ORDER — LIDOCAINE 2% (20 MG/ML) 5 ML SYRINGE
INTRAMUSCULAR | Status: DC | PRN
  Administered 2022-08-21: 60 mg via INTRAVENOUS

## 2022-08-21 MED ORDER — PROPOFOL 10 MG/ML IV BOLUS
INTRAVENOUS | Status: DC | PRN
  Administered 2022-08-21: 120 ug/kg/min via INTRAVENOUS
  Administered 2022-08-21: 150 mg via INTRAVENOUS

## 2022-08-21 MED ORDER — FENTANYL CITRATE PF 50 MCG/ML IJ SOSY
25.0000 ug | PREFILLED_SYRINGE | INTRAMUSCULAR | Status: DC | PRN
  Administered 2022-08-21 (×2): 50 ug via INTRAVENOUS

## 2022-08-21 MED ORDER — SODIUM CHLORIDE 0.9 % IV SOLN
2.0000 g | INTRAVENOUS | Status: AC
  Administered 2022-08-21: 2 g via INTRAVENOUS
  Filled 2022-08-21: qty 2

## 2022-08-21 MED ORDER — FENTANYL CITRATE (PF) 100 MCG/2ML IJ SOLN
INTRAMUSCULAR | Status: DC | PRN
  Administered 2022-08-21: 100 ug via INTRAVENOUS

## 2022-08-21 MED ORDER — FENTANYL CITRATE PF 50 MCG/ML IJ SOSY
PREFILLED_SYRINGE | INTRAMUSCULAR | Status: AC
  Administered 2022-08-21: 50 ug via INTRAVENOUS
  Filled 2022-08-21: qty 3

## 2022-08-21 MED ORDER — ROCURONIUM BROMIDE 10 MG/ML (PF) SYRINGE
PREFILLED_SYRINGE | INTRAVENOUS | Status: DC | PRN
  Administered 2022-08-21: 40 mg via INTRAVENOUS

## 2022-08-21 MED ORDER — SUCCINYLCHOLINE CHLORIDE 200 MG/10ML IV SOSY
PREFILLED_SYRINGE | INTRAVENOUS | Status: DC | PRN
  Administered 2022-08-21: 100 mg via INTRAVENOUS

## 2022-08-21 MED ORDER — ACETAMINOPHEN 10 MG/ML IV SOLN: 1000.0000 mg | Freq: Once | INTRAVENOUS | Status: DC | PRN

## 2022-08-21 MED ORDER — PROPOFOL 10 MG/ML IV BOLUS
INTRAVENOUS | Status: AC
  Filled 2022-08-21: qty 20

## 2022-08-21 MED ORDER — SCOPOLAMINE 1 MG/3DAYS TD PT72
MEDICATED_PATCH | TRANSDERMAL | Status: AC
  Filled 2022-08-21: qty 1

## 2022-08-21 MED ORDER — LACTATED RINGERS IV SOLN: INTRAVENOUS | Status: DC

## 2022-08-21 MED ORDER — FENTANYL CITRATE (PF) 100 MCG/2ML IJ SOLN
INTRAMUSCULAR | Status: AC
  Filled 2022-08-21: qty 2

## 2022-08-21 MED ORDER — CHLORHEXIDINE GLUCONATE 0.12 % MT SOLN
15.0000 mL | Freq: Once | OROMUCOSAL | Status: AC
  Administered 2022-08-21: 15 mL via OROMUCOSAL
  Filled 2022-08-21: qty 15

## 2022-08-21 MED ORDER — HYDROMORPHONE HCL 1 MG/ML IJ SOLN
INTRAMUSCULAR | Status: AC
  Administered 2022-08-21: 0.5 mg via INTRAVENOUS
  Filled 2022-08-21: qty 1

## 2022-08-21 MED ORDER — DEXAMETHASONE SODIUM PHOSPHATE 10 MG/ML IJ SOLN
INTRAMUSCULAR | Status: DC | PRN
  Administered 2022-08-21: 10 mg via INTRAVENOUS

## 2022-08-21 MED ORDER — ACETAMINOPHEN 10 MG/ML IV SOLN
INTRAVENOUS | Status: DC | PRN
  Administered 2022-08-21: 1000 mg via INTRAVENOUS

## 2022-08-21 MED ORDER — PHENYLEPHRINE HCL-NACL 20-0.9 MG/250ML-% IV SOLN
INTRAVENOUS | Status: AC
  Filled 2022-08-21: qty 250

## 2022-08-21 MED ORDER — MIDAZOLAM HCL 5 MG/5ML IJ SOLN
INTRAMUSCULAR | Status: DC | PRN
  Administered 2022-08-21: 2 mg via INTRAVENOUS

## 2022-08-21 MED ORDER — PROPOFOL 1000 MG/100ML IV EMUL
INTRAVENOUS | Status: AC
  Filled 2022-08-21: qty 100

## 2022-08-21 MED ORDER — HYDROMORPHONE HCL 1 MG/ML IJ SOLN
0.2500 mg | INTRAMUSCULAR | Status: DC | PRN
  Administered 2022-08-21: 0.5 mg via INTRAVENOUS

## 2022-08-21 MED ORDER — SUCCINYLCHOLINE CHLORIDE 200 MG/10ML IV SOSY
PREFILLED_SYRINGE | INTRAVENOUS | Status: AC
  Filled 2022-08-21: qty 10

## 2022-08-21 MED ORDER — TRAMADOL HCL 50 MG PO TABS: 50.0000 mg | ORAL_TABLET | Freq: Four times a day (QID) | ORAL | Status: DC | PRN

## 2022-08-21 MED ORDER — ACETAMINOPHEN 10 MG/ML IV SOLN
INTRAVENOUS | Status: AC
  Filled 2022-08-21: qty 100

## 2022-08-21 MED ORDER — DEXAMETHASONE SODIUM PHOSPHATE 10 MG/ML IJ SOLN
INTRAMUSCULAR | Status: AC
  Filled 2022-08-21: qty 1

## 2022-08-21 MED ORDER — ROCURONIUM BROMIDE 10 MG/ML (PF) SYRINGE
PREFILLED_SYRINGE | INTRAVENOUS | Status: AC
  Filled 2022-08-21: qty 10

## 2022-08-21 MED ORDER — PHENOL 1.4 % MT LIQD
1.0000 | OROMUCOSAL | Status: DC | PRN
  Administered 2022-08-21: 1 via OROMUCOSAL
  Filled 2022-08-21: qty 177

## 2022-08-21 MED ORDER — ONDANSETRON HCL 4 MG/2ML IJ SOLN
INTRAMUSCULAR | Status: AC
  Filled 2022-08-21: qty 2

## 2022-08-21 MED ORDER — MIDAZOLAM HCL 2 MG/2ML IJ SOLN
INTRAMUSCULAR | Status: AC
  Filled 2022-08-21: qty 2

## 2022-08-21 MED ORDER — 0.9 % SODIUM CHLORIDE (POUR BTL) OPTIME
TOPICAL | Status: DC | PRN
  Administered 2022-08-21: 1000 mL

## 2022-08-21 MED ORDER — SUGAMMADEX SODIUM 200 MG/2ML IV SOLN
INTRAVENOUS | Status: DC | PRN
  Administered 2022-08-21: 200 mg via INTRAVENOUS

## 2022-08-21 SURGICAL SUPPLY — 52 items
APPLIER CLIP 5 13 M/L LIGAMAX5 (MISCELLANEOUS)
APPLIER CLIP ROT 10 11.4 M/L (STAPLE)
BAG COUNTER SPONGE SURGICOUNT (BAG)
BLADE EXTENDED COATED 6.5IN (ELECTRODE)
BLADE HEX COATED 2.75 (ELECTRODE)
BLADE SURG SZ10 CARB STEEL (BLADE)
CATH SILICONE 16FRX5CC (CATHETERS) ×1
CHLORAPREP W/TINT 26 (MISCELLANEOUS) ×1
COVER MAYO STAND STRL (DRAPES) ×1
COVER SURGICAL LIGHT HANDLE (MISCELLANEOUS) ×1
DERMABOND ADVANCED .7 DNX12 (GAUZE/BANDAGES/DRESSINGS) ×1
DRAPE WARM FLUID 44X44 (DRAPES)
ELECT REM PT RETURN 15FT ADLT (MISCELLANEOUS) ×1
GAUZE SPONGE 4X4 12PLY STRL (GAUZE/BANDAGES/DRESSINGS) ×1
GLOVE BIO SURGEON STRL SZ7 (GLOVE) ×1
GLOVE BIOGEL M 7.0 STRL (GLOVE) ×1
GLOVE BIOGEL PI IND STRL 7.0 (GLOVE) ×1
GLOVE BIOGEL PI IND STRL 7.5 (GLOVE) ×1
GOWN STRL REUS W/ TWL LRG LVL3 (GOWN DISPOSABLE) ×2
GOWN STRL REUS W/ TWL XL LVL3 (GOWN DISPOSABLE) ×1
GOWN STRL REUS W/TWL LRG LVL3 (GOWN DISPOSABLE) ×2
GOWN STRL REUS W/TWL XL LVL3 (GOWN DISPOSABLE) ×1
IRRIG SUCT STRYKERFLOW 2 WTIP (MISCELLANEOUS)
KIT BASIN OR (CUSTOM PROCEDURE TRAY) ×1
KIT TURNOVER KIT A (KITS)
NS IRRIG 1000ML POUR BTL (IV SOLUTION) ×1
SET TUBE SMOKE EVAC HIGH FLOW (TUBING) ×1
SHEARS HARMONIC ACE PLUS 36CM (ENDOMECHANICALS)
SPIKE FLUID TRANSFER (MISCELLANEOUS) ×1
STAPLER VISISTAT 35W (STAPLE)
STRIP CLOSURE SKIN 1/2X4 (GAUZE/BANDAGES/DRESSINGS)
SUCTION POOLE HANDLE (INSTRUMENTS)
SUT ETHILON 2 0 PS N (SUTURE) ×1
SUT PDS AB 1 TP1 96 (SUTURE)
SUT PROLENE 2 0 KS (SUTURE)
SUT PROLENE 2 0 SH DA (SUTURE)
SUT SILK 2 0 (SUTURE)
SUT SILK 2 0 SH CR/8 (SUTURE)
SUT SILK 2-0 18XBRD TIE 12 (SUTURE)
SUT SILK 3 0 (SUTURE)
SUT SILK 3 0 SH CR/8 (SUTURE)
SUT SILK 3-0 18XBRD TIE 12 (SUTURE)
SUT VIC AB 3-0 SH 18 (SUTURE) ×2
TAPE STRIPS DRAPE STRL (GAUZE/BANDAGES/DRESSINGS) ×1
TOWEL OR 17X26 10 PK STRL BLUE (TOWEL DISPOSABLE) ×1
TOWEL OR NON WOVEN STRL DISP B (DISPOSABLE) ×1
TRAY FOLEY MTR SLVR 14FR STAT (SET/KITS/TRAYS/PACK)
TRAY FOLEY MTR SLVR 16FR STAT (SET/KITS/TRAYS/PACK)
TRAY LAPAROSCOPIC (CUSTOM PROCEDURE TRAY) ×1
TROCAR BALLN 12MMX100 BLUNT (TROCAR)
TROCAR XCEL NON-BLD 5MMX100MML (ENDOMECHANICALS) ×1
YANKAUER SUCT BULB TIP NO VENT (SUCTIONS)

## 2022-08-21 NOTE — Progress Notes (Signed)
Day of Surgery   Subjective/Chief Complaint: Unchanged, stent not option as expected, marked by woc   Objective: Vital signs in last 24 hours: Temp:  [97.7 F (36.5 C)-98.9 F (37.2 C)] 98.9 F (37.2 C) (03/25 2010) Pulse Rate:  [91-93] 91 (03/26 0557) Resp:  [14-18] 18 (03/26 0557) BP: (131-144)/(74-77) 144/77 (03/26 0557) SpO2:  [98 %-99 %] 98 % (03/26 0557) Last BM Date : 08/20/22  Intake/Output from previous day: 03/25 0701 - 03/26 0700 In: 2339.5 [I.V.:2339.5] Out: 700 [Emesis/NG output:700] Intake/Output this shift: Total I/O In: -  Out: 300 [Urine:300]  Ab soft  Lab Results:  Recent Labs    08/20/22 0239 08/21/22 0258  WBC 2.5* 2.7*  HGB 9.9* 10.1*  HCT 30.9* 31.5*  PLT 102* 127*   BMET Recent Labs    08/20/22 0239 08/21/22 0258  NA 137 135  K 3.9 3.8  CL 106 105  CO2 21* 24  GLUCOSE 79 120*  BUN <5* <5*  CREATININE 0.34* 0.36*  CALCIUM 7.4* 7.7*   PT/INR No results for input(s): "LABPROT", "INR" in the last 72 hours. ABG No results for input(s): "PHART", "HCO3" in the last 72 hours.  Invalid input(s): "PCO2", "PO2"  Studies/Results: No results found.  Anti-infectives: Anti-infectives (From admission, onward)    Start     Dose/Rate Route Frequency Ordered Stop   08/21/22 1000  cefoTEtan (CEFOTAN) 2 g in sodium chloride 0.9 % 100 mL IVPB        2 g 200 mL/hr over 30 Minutes Intravenous On call 08/21/22 0838 08/22/22 1000   08/18/22 1015  cefTRIAXone (ROCEPHIN) 1 g in sodium chloride 0.9 % 100 mL IVPB        1 g 200 mL/hr over 30 Minutes Intravenous  Once 08/18/22 1012 08/18/22 1144       Assessment/Plan: Plan for loop ileostomy today due to malignant lbo Discussed surgery, risks and recovery    Rebecca Bullock 08/21/2022

## 2022-08-21 NOTE — Anesthesia Preprocedure Evaluation (Addendum)
Anesthesia Evaluation  Patient identified by MRN, date of birth, ID band Patient awake    Reviewed: Allergy & Precautions, NPO status , Patient's Chart, lab work & pertinent test results  Airway Mallampati: II  TM Distance: >3 FB Neck ROM: Full    Dental no notable dental hx.    Pulmonary neg pulmonary ROS   Pulmonary exam normal        Cardiovascular negative cardio ROS  Rhythm:Regular Rate:Normal     Neuro/Psych negative neurological ROS  negative psych ROS   GI/Hepatic Neg liver ROS, hiatal hernia,,,Large bowel obstruction   Endo/Other  negative endocrine ROS    Renal/GU   negative genitourinary   Musculoskeletal negative musculoskeletal ROS (+)    Abdominal Normal abdominal exam  (+)   Peds  Hematology  (+) Blood dyscrasia, anemia   Anesthesia Other Findings   Reproductive/Obstetrics                             Anesthesia Physical Anesthesia Plan  ASA: 2  Anesthesia Plan: General   Post-op Pain Management:    Induction: Intravenous and Rapid sequence  PONV Risk Score and Plan: 3 and Ondansetron, Dexamethasone, Midazolam, Treatment may vary due to age or medical condition and TIVA  Airway Management Planned: Mask and Oral ETT  Additional Equipment: None  Intra-op Plan:   Post-operative Plan: Extubation in OR  Informed Consent: I have reviewed the patients History and Physical, chart, labs and discussed the procedure including the risks, benefits and alternatives for the proposed anesthesia with the patient or authorized representative who has indicated his/her understanding and acceptance.     Dental advisory given  Plan Discussed with: CRNA  Anesthesia Plan Comments: (Lab Results      Component                Value               Date                      WBC                      2.7 (L)             08/21/2022                HGB                      10.1 (L)             08/21/2022                HCT                      31.5 (L)            08/21/2022                MCV                      93.8                08/21/2022                PLT                      127 (L)  08/21/2022           )       Anesthesia Quick Evaluation

## 2022-08-21 NOTE — Anesthesia Postprocedure Evaluation (Signed)
Anesthesia Post Note  Patient: Brenae Lafuente  Procedure(s) Performed: LAPAROSCOPIC LOOP ILEOSTOMY     Patient location during evaluation: PACU Anesthesia Type: General Level of consciousness: awake and alert Pain management: pain level controlled Vital Signs Assessment: post-procedure vital signs reviewed and stable Respiratory status: spontaneous breathing, nonlabored ventilation, respiratory function stable and patient connected to nasal cannula oxygen Cardiovascular status: blood pressure returned to baseline and stable Postop Assessment: no apparent nausea or vomiting Anesthetic complications: no   No notable events documented.  Last Vitals:  Vitals:   08/21/22 1729 08/21/22 2125  BP: 139/70 138/87  Pulse: 85 94  Resp: 18 18  Temp: 36.7 C 36.7 C  SpO2: 97% 97%    Last Pain:  Vitals:   08/21/22 2125  TempSrc: Oral  PainSc:                  March Rummage Virgie Chery

## 2022-08-21 NOTE — Progress Notes (Signed)
  Progress Note   Patient: Rebecca Bullock [id number] DOB: 12-19-1965 DOA: 08/18/2022     3 DOS: the patient was seen and examined on 08/21/2022 at 9:20 AM      Brief hospital course: Mrs. Nosek is a 57 y.o. F with colon CA metastatic to liver and bone who presented with malignant colon obstruction.   3/23: Admitted with NG 3/24: GI consulted 3/25: GI determined no stent possible 3/26: To the OR for diverting loop ileostomy with Dr. Donne Hazel     Assessment and Plan: * Malignant ascending colon obstruction Location not amenable to stenting.  Primary anastomosis not possible with recent Avastin.  Gen surg will take to OR today for diverting ileostomy. - Continue NG - NPO, MIVF - Continue analgesics - Consult Gen Surg, GI, Oncology, appreciate expertise    Obesity (BMI 30-39.9) BMI 32  Pancytopenia (HCC) Due to chemo  AKI (acute kidney injury) ([place]) Cr 1.4 on admission, with fluids resolved to 0.5 baseline.  Colon cancer metastasized to liver [facility])             Subjective: Patient feeling well this morning, moderate abdominal pain, some flatus, no confusion, no nursing concerns.  Planning to go to the OR later today.     Physical Exam: BP (!) 157/89   Pulse 86   Temp 97.8 F (36.6 C)   Resp 19   Ht 5\' 1"  (1.549 m)   Wt 79 kg   SpO2 99%   BMI 32.91 kg/m   Adult female, sitting up in bed, interactive and appropriate RRR, no murmurs, no peripheral edema Respiratory rate normal, lungs clear without rales or wheezes Abdomen soft, diffuse tenderness, some mild distention, voluntary guarding Attention normal, affect appropriate, judgment insight appear normal     Data Reviewed: CBC shows mild pancytopenia, no change from yesterday Creatinine, potassium, normal on BMP.  Family Communication: Daughters and boyfriend at the bedside    Disposition: Status is: Inpatient The patient presented with malignant bowel obstruction.  General surgery were  consulted, considered less invasive means for relieving the obstruction, but unfortunately there are no options  Had a ileostomy today, disposition  likely to home, timing per per general surgery        Author: Edwin Dada, MD 08/21/2022 2:02 PM  For on call review [email or web address]

## 2022-08-21 NOTE — Op Note (Signed)
Preoperative diagnosis: malignant large bowel obstruction, stage IV colorectal cancer Postoperative diagnosis: same as above Procedure: Laparoscopic loop ileostomy Surgeon: Dr Serita Grammes Anesthesia general EBL minimal Specimens none Drains none Complications none Sponge and needle count correct Dispo recovery stable  Indications: This is a 60 yof with stage IV colon cancer. Her primary cancer is in her ascending colon and she has developed a malignant large bowel obstruction. She is currently being treated with avastin.  Discussed all options and elected to proceed with lap diverting ileostomy  Procedure: After informed consent obtained she was taken to the OR.  She was given antibiotics and SCDs were in place.  She was placed under general anesthesia without complication.  She was prepped and draped in the standard sterile surgical fashion.  A surgical timeout was then performed.  I made incision in her left upper quadrant.  Then used a direct optical entry trocar that was a 5 mm trocar and entered the abdomen without difficulty.  This was without injury.  I viewed this later again.  The abdomen was then insufflated 15 mmHg pressure.  I then placed 2 additional 5 mm trocars in the left hemiabdomen without difficulty.  Her abdomen was fairly easily and easy to navigate with no real scar tissue present.  The tumor was not noted.  I was able to identify the cecum and the terminal ileum.  I traced this back to a location that would reach the abdominal wall at the site of the mark that was made by the walk-in nurses.  I then cored out this piece of skin in the surrounding fat.  I entered into the abdomen and brought this piece of intestine up to the abdominal wall.  I then removed the laparoscopic equipment.  I then made a vertical incision in the small bowel that have been brought up.  Both limbs were patent.  I placed a nonlatex containing Foley catheter as a bar and secured this with a nylon  suture.  I then matured this with 3-0 Vicryl sutures.  This was viable and patent.  I then closed the incisions with 4-0 Monocryl and glue.  She tolerated this well was extubated and transferred recovery stable.

## 2022-08-21 NOTE — Transfer of Care (Signed)
Immediate Anesthesia Transfer of Care Note  Patient: Rebecca Bullock  Procedure(s) Performed: LAPAROSCOPIC LOOP ILEOSTOMY  Patient Location: PACU  Anesthesia Type:General  Level of Consciousness: awake, alert , oriented, and patient cooperative  Airway & Oxygen Therapy: Patient Spontanous Breathing and Patient connected to face mask oxygen  Post-op Assessment: Report given to RN and Post -op Vital signs reviewed and stable  Post vital signs: Reviewed and stable  Last Vitals:  Vitals Value Taken Time  BP 146/79 08/21/22 1307  Temp    Pulse 91 08/21/22 1310  Resp 28 08/21/22 1310  SpO2 96 % 08/21/22 1310  Vitals shown include unvalidated device data.  Last Pain:  Vitals:   08/21/22 1038  TempSrc:   PainSc: 0-No pain      Patients Stated Pain Goal: 2 ([id number])  Complications: No notable events documented.

## 2022-08-21 NOTE — Progress Notes (Signed)
Unfortunately, the colonic stent cannot be placed.  I know that Gastroenterology thought really hard about this.  As such, she does not have to go to surgery.  I realize that this can be challenging given that she had Avastin 2 weeks ago.  However, the obstruction remains and likely will not improve with NG suction.  I do appreciate everybody's help with this.  I feel bad for [name].  I know that she is doing everything that we have asked her to do.  She is doing okay this morning.  She did have a little bit of a bowel move yesterday after having a rectal suppository.  Her labs show a sodium 135.  Potassium 3.8.  BUN less than 5 creatinine 0.36.  Calcium 7.7 with an albumin of 2.2.  Her white count is 2.7.  Her hemoglobin is 10.1.  Platelet count 127,000.  I can certainly give her some Neupogen if necessary.  However, I think her absolute neutrophil count should be okay for surgery.  She has had no fever.  There has been no bleeding.  Her vital signs are temperature 98.4.  Pulse 91.  Blood pressure 144/77.  Her lungs sound clear bilaterally.  Cardiac exam regular rate and rhythm.  She has no murmurs, rubs or bruits.  Abdomen is soft.  There may be a little bit of slight distention.  Bowel sounds are markedly decreased.  She has no obvious fluid wave.  There is no palpable liver or spleen tip.  Extremity shows no clubbing, cyanosis or edema.  Neurological exam shows no focal neurological deficits.  Skin exam shows no rashes, ecchymosis or petechia.  At this point, the neck step is going to be surgery.  Sounds like she will have a loop ileostomy.  I know that Dr. Donne Hazel has an incredible amount of count and skills so I am sure that he will do everything possible to bypass this obstruction so she can eat again.  I will still follow along.  We will have to see how her white cell count trends.  I would like to think that the white cell count will trend upward.   Lattie Haw, MD  [place] 15:15

## 2022-08-21 NOTE — Anesthesia Procedure Notes (Signed)
Procedure Name: Intubation Date/Time: 08/21/2022 12:00 PM  Performed by: Cleda Daub, CRNAPre-anesthesia Checklist: Patient identified, Emergency Drugs available, Suction available and Patient being monitored Patient Re-evaluated:Patient Re-evaluated prior to induction Oxygen Delivery Method: Circle system utilized Preoxygenation: Pre-oxygenation with 100% oxygen Induction Type: IV induction and Rapid sequence Laryngoscope Size: Mac and 3 Grade View: Grade I Tube type: Oral Tube size: 7.0 mm Number of attempts: 1 Airway Equipment and Method: Stylet and Oral airway Placement Confirmation: ETT inserted through vocal cords under direct vision, positive ETCO2 and breath sounds checked- equal and bilateral Secured at: 21 cm Tube secured with: Tape Dental Injury: Teeth and Oropharynx as per pre-operative assessment

## 2022-08-22 ENCOUNTER — Encounter (HOSPITAL_COMMUNITY): Payer: Self-pay | Admitting: General Surgery

## 2022-08-22 DIAGNOSIS — D61818 Other pancytopenia: Secondary | ICD-10-CM

## 2022-08-22 DIAGNOSIS — K56691 Other complete intestinal obstruction: Secondary | ICD-10-CM | POA: Diagnosis not present

## 2022-08-22 DIAGNOSIS — C189 Malignant neoplasm of colon, unspecified: Secondary | ICD-10-CM | POA: Diagnosis not present

## 2022-08-22 DIAGNOSIS — C787 Secondary malignant neoplasm of liver and intrahepatic bile duct: Secondary | ICD-10-CM | POA: Diagnosis not present

## 2022-08-22 LAB — BASIC METABOLIC PANEL
Anion gap: 5 (ref 5–15)
BUN: <5 mg/dL — ABNORMAL LOW (ref 6–20)
CO2: 24 mmol/L (ref 22–32)
Calcium: 7.9 mg/dL — ABNORMAL LOW (ref 8.9–10.3)
Chloride: 107 mmol/L (ref 98–111)
Creatinine, Ser: 0.38 mg/dL — ABNORMAL LOW (ref 0.44–1.00)
GFR, Estimated: >60 mL/min (ref >60)
Glucose, Bld: 136 mg/dL — ABNORMAL HIGH (ref 70–99)
Potassium: 4 mmol/L (ref 3.5–5.1)
Sodium: 136 mmol/L (ref 135–145)

## 2022-08-22 LAB — CBC
HCT: 30.5 % — ABNORMAL LOW (ref 36.0–46.0)
Hemoglobin: 10 g/dL — ABNORMAL LOW (ref 12.0–15.0)
MCH: 30.5 pg (ref 26.0–34.0)
MCHC: 32.8 g/dL (ref 30.0–36.0)
MCV: 93 fL (ref 80.0–100.0)
Platelets: 149 10*3/uL — ABNORMAL LOW (ref 150–400)
RBC: 3.28 MIL/uL — ABNORMAL LOW (ref 3.87–5.11)
RDW: 16 % — ABNORMAL HIGH (ref 11.5–15.5)
WBC: 3.8 10*3/uL — ABNORMAL LOW (ref 4.0–10.5)
nRBC: 0 % (ref 0.0–0.2)

## 2022-08-22 MED ORDER — HYDROMORPHONE HCL 2 MG PO TABS
2.0000 mg | ORAL_TABLET | ORAL | Status: DC | PRN
  Administered 2022-08-22 – 2022-08-23 (×5): 2 mg via ORAL
  Filled 2022-08-22 (×5): qty 1

## 2022-08-22 MED ORDER — ACETAMINOPHEN 325 MG PO TABS: 975.0000 mg | ORAL_TABLET | Freq: Four times a day (QID) | ORAL | Status: DC | PRN

## 2022-08-22 MED ORDER — FAMOTIDINE 20 MG PO TABS: 40.0000 mg | ORAL_TABLET | Freq: Two times a day (BID) | ORAL | Status: DC | PRN

## 2022-08-22 MED ORDER — CYCLOBENZAPRINE HCL 10 MG PO TABS
10.0000 mg | ORAL_TABLET | Freq: Three times a day (TID) | ORAL | Status: DC | PRN
  Administered 2022-08-22: 10 mg via ORAL
  Filled 2022-08-22: qty 1

## 2022-08-22 NOTE — Progress Notes (Signed)
  Progress Note   Patient: Rebecca Bullock DOB: 1965/12/19 DOA: 08/18/2022     4 DOS: the patient was seen and examined on 08/22/2022   Brief hospital course: 62yow PMH colon CA metastatic to liver and bone who presented with malignant colon obstruction. 3/23: Admitted with NG 3/24: GI consulted 3/25: GI determined no stent possible 3/26: To the OR for diverting loop ileostomy with Dr. Donne Hazel  Assessment and Plan: * Malignant ascending colon obstruction Colon cancer metastasized to liver [facility]) Location not amenable to stenting.  Primary anastomosis not possible with recent Avastin. Status post diverting ileostomy 3/26 Seems to be improving.  Likely home tomorrow per general surgery.  Appreciate oncology.  Obesity (BMI 30-39.9) BMI 32  Pancytopenia (HCC) Secondary to chemotherapy.  Hemoglobin stable.  Platelets and WBC trending up.  AKI (acute kidney injury) ([place]) Cr 1.46 on admission, with fluids resolved to 0.5 baseline.     Subjective:  Feels ok  Physical Exam: Vitals:   08/22/22 0220 08/22/22 0500 08/22/22 0848 08/22/22 1218  BP: (!) 148/82 (!) 103/57 108/86 130/72  Pulse: 85 73 69 89  Resp: 17 18 18 18   Temp: 98 F (36.7 C) 98.4 F (36.9 C) 98.1 F (36.7 C) 97.8 F (36.6 C)  TempSrc: Oral Oral Oral   SpO2: 100% 98% 97% 100%  Weight:      Height:       Physical Exam Vitals reviewed.  Constitutional:      General: She is not in acute distress.    Appearance: She is not ill-appearing or toxic-appearing.  Cardiovascular:     Rate and Rhythm: Normal rate and regular rhythm.     Heart sounds: No murmur heard. Pulmonary:     Effort: Pulmonary effort is normal. No respiratory distress.     Breath sounds: No wheezing, rhonchi or rales.  Neurological:     Mental Status: She is alert.  Psychiatric:        Mood and Affect: Mood normal.        Behavior: Behavior normal.    Data Reviewed: BMP noted CBC noted, stable  Family Communication: son at  bedside  Disposition: Status is: Inpatient Remains inpatient appropriate because: s/p surgery  Planned Discharge Destination: Home    Time spent: 20 minutes  Author: Murray Hodgkins, MD 08/22/2022 1:42 PM  For on call review [email or web address].

## 2022-08-22 NOTE — Progress Notes (Signed)
Progress Note  1 Day Post-Op  Subjective: Pt reports abdomen is sore but overall doing well. Denies nausea or vomiting and tolerating CLD. Having ileostomy output. Family coming in tomorrow for teaching on stoma care.   Objective: Vital signs in last 24 hours: Temp:  [97.8 F (36.6 C)-98.4 F (36.9 C)] 98.1 F (36.7 C) (03/27 0848) Pulse Rate:  [68-97] 69 (03/27 0848) Resp:  [12-20] 18 (03/27 0848) BP: (103-157)/(57-89) 108/86 (03/27 0848) SpO2:  [93 %-100 %] 97 % (03/27 0848) Weight:  [79 kg] 79 kg (03/26 1038) Last BM Date : 08/20/22  Intake/Output from previous day: 03/26 0701 - 03/27 0700 In: 3381.8 [P.O.:480; I.V.:2701.8; IV Piggyback:200] Out: 3080 [Urine:2650; Stool:410; Blood:20] Intake/Output this shift: Total I/O In: -  Out: 100 [Urine:100]  PE: General: pleasant, WD, WN female who is laying in bed in NAD Lungs: Respiratory effort nonlabored Abd: soft, appropriately ttp, ND, stoma viable with liquid output in ostomy bag, incisions C/D/I Psych: A&Ox3 with an appropriate affect.    Lab Results:  Recent Labs    08/21/22 0258 08/22/22 0407  WBC 2.7* 3.8*  HGB 10.1* 10.0*  HCT 31.5* 30.5*  PLT 127* 149*   BMET Recent Labs    08/21/22 0258 08/22/22 0407  NA 135 136  K 3.8 4.0  CL 105 107  CO2 24 24  GLUCOSE 120* 136*  BUN <5* <5*  CREATININE 0.36* 0.38*  CALCIUM 7.7* 7.9*   PT/INR No results for input(s): "LABPROT", "INR" in the last 72 hours. CMP     Component Value Date/Time   NA 136 08/22/2022 0407   K 4.0 08/22/2022 0407   CL 107 08/22/2022 0407   CO2 24 08/22/2022 0407   GLUCOSE 136 (H) 08/22/2022 0407   BUN <5 (L) 08/22/2022 0407   CREATININE 0.38 (L) 08/22/2022 0407   CREATININE 0.48 08/13/2022 1235   CREATININE 0.56 03/23/2021 0000   CALCIUM 7.9 (L) 08/22/2022 0407   PROT 6.3 (L) 08/21/2022 0258   ALBUMIN 3.2 (L) 08/21/2022 0258   AST 19 08/21/2022 0258   AST 31 08/13/2022 1235   ALT 14 08/21/2022 0258   ALT 19 08/13/2022  1235   ALKPHOS 138 (H) 08/21/2022 0258   BILITOT 0.6 08/21/2022 0258   BILITOT 0.3 08/13/2022 1235   GFRNONAA >60 08/22/2022 0407   GFRNONAA >60 08/13/2022 1235   GFRNONAA 102 06/24/2018 1007   GFRAA 119 06/24/2018 1007   Lipase  No results found for: "LIPASE"     Studies/Results: No results found.  Anti-infectives: Anti-infectives (From admission, onward)    Start     Dose/Rate Route Frequency Ordered Stop   08/21/22 1000  cefoTEtan (CEFOTAN) 2 g in sodium chloride 0.9 % 100 mL IVPB        2 g 200 mL/hr over 30 Minutes Intravenous On call 08/21/22 0838 08/21/22 1201   08/18/22 1015  cefTRIAXone (ROCEPHIN) 1 g in sodium chloride 0.9 % 100 mL IVPB        1 g 200 mL/hr over 30 Minutes Intravenous  Once 08/18/22 1012 08/18/22 1144        Assessment/Plan   LBO secondary to stage IV colorectal cancer with ascending colon mass POD1 s/p laparoscopic diverting loop ileostomy  - advance to regular diet and monitor output - will reorder home pain medications - mobilize - DC foley - possibly home in the next 1-2 days if doing well  - WOC following for ostomy teaching    FEN: reg diet, SLIV VTE:  LMWH ID: [email or web address] pre-op yesterday   LOS: 4 days     Norm Parcel, [facility] 08/22/2022, 10:08 AM Please see Amion for pager number during day hours 7:00am-4:30pm

## 2022-08-22 NOTE — Progress Notes (Signed)
Rebecca Bullock looks good this morning.  She feels wonderful this morning.  The NG tube is out.  She underwent surgery yesterday.  As always, Dr. Donne Hazel did a fantastic job.  She has liquid stool in the ostomy bag.  Her labs today show a BUN of less than 5 creatinine 0.38.  Sodium 136 potassium 4.0.  Her white count 3.8.  Hemoglobin 10.0.  Platelet count 149,000.  She is little bit nervous about having the ileostomy bag.  I told her that we will take a little bit of time for her to get used to it and know what to do.  Hopefully, she will be able to increase her dietary intake.  She is on some clear liquid right now.  I will have to see if we can start her on the fruquintinib.  I know this is a tyrosine kinase inhibitor agent.  I will have to see what the recommendations are after surgery.  Her temperature is 98.4.  Pulse 73.  Blood pressure 103/57.  Her lungs are clear bilaterally.  Cardiac exam regular rate and rhythm.  She has no murmurs.  Abdomen is soft.  She has the ostomy bag in the right lower quadrant.  Abdomen is nondistended.  Bowel sounds are decreased.  Extremity shows no clubbing, cyanosis or edema.  Neurological exam is nonfocal.  [name] hopefully will continue to improve and now that she has had the obstruction bypassed.  We will have to see how she does with eating.  I am sure that she will get out of bed gradually and do more activities.  It is obvious that she is getting incredible care from everybody up on 3 E.  I appreciate everybody's help.  Rebecca Haw, MD  Rebecca Bullock [phone]

## 2022-08-22 NOTE — Consult Note (Addendum)
[employer] Nurse ostomy consult note Pt had ileostomy surgery performed yesterday. Current pouch is intact with good seal, mod amt liquid brown stool in the pouch, stoma is red and viable when visualized through the pouch. Pt called family members and they request a pouch change and teaching session be performed tomorrow at 1000.  Educational materials left at bedside and I ordered 5 sets of supplies for use: Order Supplies: barrier ring, Kellie Simmering # [id number] and convex pouch Lawson # [id number]. Thank-you,  Julien Girt MSN, [place], [place], [place], [place]

## 2022-08-23 ENCOUNTER — Encounter: Payer: Self-pay | Admitting: Hematology & Oncology

## 2022-08-23 ENCOUNTER — Other Ambulatory Visit (HOSPITAL_BASED_OUTPATIENT_CLINIC_OR_DEPARTMENT_OTHER): Payer: Self-pay

## 2022-08-23 ENCOUNTER — Other Ambulatory Visit: Payer: Self-pay | Admitting: Hematology & Oncology

## 2022-08-23 ENCOUNTER — Other Ambulatory Visit (HOSPITAL_COMMUNITY): Payer: Self-pay

## 2022-08-23 ENCOUNTER — Encounter: Payer: Self-pay | Admitting: Family

## 2022-08-23 DIAGNOSIS — C787 Secondary malignant neoplasm of liver and intrahepatic bile duct: Secondary | ICD-10-CM

## 2022-08-23 DIAGNOSIS — D61818 Other pancytopenia: Secondary | ICD-10-CM | POA: Diagnosis not present

## 2022-08-23 DIAGNOSIS — K56691 Other complete intestinal obstruction: Secondary | ICD-10-CM | POA: Diagnosis not present

## 2022-08-23 DIAGNOSIS — C189 Malignant neoplasm of colon, unspecified: Secondary | ICD-10-CM | POA: Diagnosis not present

## 2022-08-23 DIAGNOSIS — N179 Acute kidney failure, unspecified: Secondary | ICD-10-CM | POA: Diagnosis not present

## 2022-08-23 LAB — BASIC METABOLIC PANEL
Anion gap: 5 (ref 5–15)
BUN: 5 mg/dL — ABNORMAL LOW (ref 6–20)
CO2: 25 mmol/L (ref 22–32)
Calcium: 8.1 mg/dL — ABNORMAL LOW (ref 8.9–10.3)
Chloride: 106 mmol/L (ref 98–111)
Creatinine, Ser: 0.43 mg/dL — ABNORMAL LOW (ref 0.44–1.00)
GFR, Estimated: >60 mL/min (ref >60)
Glucose, Bld: 89 mg/dL (ref 70–99)
Potassium: 3.4 mmol/L — ABNORMAL LOW (ref 3.5–5.1)
Sodium: 136 mmol/L (ref 135–145)

## 2022-08-23 MED ORDER — IRON 325 (65 FE) MG PO TABS
1.0000 | ORAL_TABLET | Freq: Two times a day (BID) | ORAL | 0 refills | Status: DC | PRN
  Filled 2022-08-23: qty 60, 30d supply, fill #0

## 2022-08-23 MED ORDER — HYDROMORPHONE HCL 2 MG PO TABS
2.0000 mg | ORAL_TABLET | ORAL | 0 refills | Status: DC | PRN
  Filled 2022-08-23: qty 120, 20d supply, fill #0

## 2022-08-23 MED ORDER — FIBERCON 625 MG PO TABS
625.0000 mg | ORAL_TABLET | Freq: Two times a day (BID) | ORAL | 0 refills | Status: AC | PRN
  Filled 2022-08-23: qty 30, 15d supply, fill #0

## 2022-08-23 MED ORDER — HEPARIN SOD (PORK) LOCK FLUSH 100 UNIT/ML IV SOLN
500.0000 [IU] | INTRAVENOUS | Status: AC | PRN
  Administered 2022-08-23: 500 [IU]
  Filled 2022-08-23: qty 5

## 2022-08-23 NOTE — Discharge Instructions (Signed)
POST OP INSTRUCTIONS AFTER COLON SURGERY  DIET: Be sure to include lots of fluids daily to stay hydrated - 64oz of water per day (8, 8 oz glasses).  Avoid fast food or heavy meals for the first couple of weeks as your are more likely to get nauseated. Avoid raw/uncooked fruits or vegetables for the first 4 weeks (its ok to have these if they are blended into smoothie form). If you have fruits/vegetables, make sure they are cooked until soft enough to mash on the roof of your mouth and chew your food well. Otherwise, diet as tolerated.  Take your usually prescribed home medications unless otherwise directed.  PAIN CONTROL: Pain is best controlled by a usual combination of three different methods TOGETHER: Ice/Heat Over the counter pain medication Prescription pain medication Most patients will experience some swelling and bruising around the surgical site.  Ice packs or heating pads (30-60 minutes up to 6 times a day) will help. Some people prefer to use ice alone, heat alone, alternating between ice & heat.  Experiment to what works for you.  Swelling and bruising can take several weeks to resolve.   It is helpful to take an over-the-counter pain medication regularly for the first few weeks: Ibuprofen (Motrin/Advil) - '200mg'$  tabs - take 3 tabs ('600mg'$ ) every 6 hours as needed for pain (unless you have been directed previously to avoid NSAIDs/ibuprofen) Acetaminophen (Tylenol) - you may take '650mg'$  every 6 hours as needed. You can take this with motrin as they act differently on the body. If you are taking a narcotic pain medication that has acetaminophen in it, do not take over the counter tylenol at the same time. NOTE: You may take both of these medications together - most patients  find it most helpful when alternating between the two (i.e. Ibuprofen at 6am, tylenol at 9am, ibuprofen at 12pm ..Marland Kitchen) A  prescription for pain medication should be given to you upon discharge.  Take your pain medication as  prescribed if your pain is not adequatly controlled with the over-the-counter pain reliefs mentioned above.  Avoid getting constipated.  Between the surgery and the pain medications, it is common to experience some constipation.  Increasing fluid intake and taking a fiber supplement (such as Metamucil, Citrucel, FiberCon, MiraLax, etc) 1-2 times a day regularly will usually help prevent this problem from occurring.  A mild laxative (prune juice, Milk of Magnesia, MiraLax, etc) should be taken according to package directions if there are no bowel movements after 48 hours.    Dressing: Your incisions are covered in Dermabond which is like sterile superglue for the skin. This will come off on it's own in a couple weeks. It is waterproof and you may bathe normally starting the day after your surgery in a shower. Avoid baths/pools/lakes/oceans until your wounds have fully healed. Ostomy: Your ileostomy should have an appliance cut to fit. The bag not only collects the output but helps protect your skin. Continue to apply as instructed during your hospitalization. You have been arranged for home health and additionally with [facility] for home ostomy supplies. Generally, additional appliances may be found online or at local medical supply stores such as [employer]. You will need to monitor your ostomy output closely and keep a record of the output. If you were discharged on Imodium, continue taking this as prescribed. It is important to make it a point to stay hydrated - Gatorade (particularly sugar free) is an excellent way to help supplement your water intake  as it has electrolytes in it. Continue to take metamucil (or other psyllium supplementation) to help thicken the output. We target toothpaste consistency. If your ileostomy output is >1.2L (1200 mL) in 24 hrs, increase your fluid intake and contact us for further instruction. One of the most common reasons patients are readmitted after this procedure is  for dehydration from high ileostomy output.  ACTIVITIES as tolerated:   Avoid heavy lifting (>10lbs or 1 gallon of milk) for the next 6 weeks. You may resume regular daily activities as tolerated--such as daily self-care, walking, climbing stairs--gradually increasing activities as tolerated.  If you can walk 30 minutes without difficulty, it is safe to try more intense activity such as jogging, treadmill, bicycling, low-impact aerobics.  DO NOT PUSH THROUGH PAIN.  Let pain be your guide: If it hurts to do something, don't do it. You may drive when you are no longer taking prescription pain medication, you can comfortably wear a seatbelt, and you can safely maneuver your car and apply brakes.  FOLLOW UP in our office Please call [facility] at [phone] to set up an appointment to see your surgeon in the office for a follow-up appointment approximately 2 weeks after your surgery. Make sure that you call for this appointment the day you arrive home to insure a convenient appointment time.  9. If you have disability or family leave forms that need to be completed, you may have them completed by your primary care physician's office; for return to work instructions, please ask our office staff and they will be happy to assist you in obtaining this documentation   When to call us [phone]: Poor pain control Reactions / problems with new medications (rash/itching, etc)  Fever over 101.5 F (38.5 C) Inability to urinate Nausea/vomiting Worsening swelling or bruising Continued bleeding from incision. Increased pain, redness, or drainage from the incision  The clinic staff is available to answer your questions during regular business hours (8:30am-5pm).  Please don't hesitate to call and ask to speak to one of our nurses for clinical concerns.   A surgeon from [facility] is always on call at the hospitals   If you have a medical emergency, go to the nearest emergency room or call  [phone].  [facility] [address] [place], [place],   [address] MAIN: [phone] FAX: [phone] [email or web address]   Ostomy  WHAT IS AN OSTOMY? An ostomy is a surgically created opening connecting an internal organ to the surface of the body. Different kinds of ostomies are named for the organ involved. The most common types of ostomies in intestinal surgery are an "ileostomy" (connecting the ileal part of the small intestine to the abdominal wall) and a "colostomy" (connecting the colon, or, large intestine to the abdominal wall). An ostomy may be temporary or permanent. A temporary ostomy may be required if the intestinal tract can't be properly prepared for surgery because of blockage by disease or scar tissue. A temporary ostomy may also be created to allow inflammation or an operative site to heal without contamination by stool. Temporary ostomies can usually be reversed with minimal or no loss of intestinal function. A permanent ostomy may be required when disease, or its treatment, impairs normal intestinal function, or when the muscles that control elimination do not work properly or require removal. The most common causes of these conditions are low rectal cancer and inflammatory bowel disease.   An ostomy connects either the  small or the large intestine to the surface of the body.   HOW WILL I CONTROL MY BOWEL MOVEMENTS? Once your ostomy has been created, your surgeon or wound ostomy continence nurse (a [facility] specializes in ostomy care) will teach you to attach and care for a pouch called an ostomy appliance. An ostomy appliance, or pouch, is designed to catch eliminated fecal material (stool). The pouch is made of plastic and is held to the body with an adhesive. The adhesive, in turn, protects the skin from moisture. The pouch is disposable and is emptied or changed as needed. The system is quite secure;  "accidents" are not common, and the pouches are odor-free. Your bowel movements will naturally empty into the pouch. The frequency and quantity of your bowel movements will vary, depending on the type of ostomy you have, your diet, and your bowel habits prior to surgery. You may be instructed to modify your eating habits in order to control the frequency and consistency of your bowel movements. If the ostomy is a colostomy, irrigation techniques may be learned which allow for increased control over the timing of bowel movements.     An ostomy appliance is a plastic pouch, held to the body with an adhesive skin barrier, that provides secure and odor-free control of bowel movements.   WILL OTHER PEOPLE KNOW THAT I HAVE AN OSTOMY? Not unless you tell them. An ostomy is easily hidden by your usual clothing. You probably have met people with an ostomy and not realized it! WHERE WILL THE OSTOMY BE? An ostomy is best placed on a flat portion of the abdominal wall. Before undergoing surgery to create an ostomy, it is best for your surgeon or [facility] nurse to mark an appropriate place on your abdominal wall not constricted by your belt-line. A colostomy is usually placed to the left of your navel and an ileostomy to the right. WILL MY PHYSICAL ACTIVITIES BE LIMITED? The answer to this question is usually no. Public figures, prominent entertainers, and even professional athletes have ostomies that do not significantly limit their activities. All your usual activities, including active sports, may be resumed once healing from surgery is complete. WILL AN OSTOMY AFFECT INTIMACY? Most patients with ostomies resume their usual sexual activity. Many people with ostomies worry about how their sexual partner will think of them because of their appliance. This perceived change in one's body image can be overcome by a strong relationship, time and patience. WHAT ARE THE COMPLICATIONS OF AN OSTOMY? Complications from an  ostomy can occur. Most, like local skin irritation are typically minor and can be easily remedied. Problems such as a hernia associated with the ostomy or prolapse of the ostomy (a protrusion of the bowel) occasionally require surgery if they cause significant symptoms. Weight loss or gain may affect the function of an ostomy. Living with an ostomy will require some adjustments and learning, but an active and fulfilling life is still possible and likely. Your colon and rectal surgeon and [facility] nurse will provide you with skills and support to help you better live with your ostomy. WOULD YOU LIKE ADDITIONAL INFORMATION? More information about ostomies can be found at: www.uoaa.org - [employer] [place] [employer] [place] WHAT IS A COLON AND RECTAL SURGEON? Colon and rectal surgeons are experts in the surgical and non-surgical treatment of diseases of the colon, rectum and anus. They have completed advanced surgical training in the treatment of these diseases as well as full general surgical training. Board-certified  colon and rectal surgeons complete residencies in general surgery and colon and rectal surgery, and pass intensive examinations conducted by the American Board of Surgery and the American Board of Colon and Rectal Surgery. They are well-versed in the treatment of both benign and malignant diseases of the colon, rectum and anus and are able to perform routine screening examinations and surgically treat conditions if indicated to do so.

## 2022-08-23 NOTE — Plan of Care (Signed)

## 2022-08-23 NOTE — Progress Notes (Signed)
Progress Note  2 Days Post-Op  Subjective: Pt up in chair this AM and feels well. Back on home pain meds and did not require any additional IV pain meds after resuming those. Tolerating diet and having ileostomy output. Feels comfortable with emptying ileostomy and doing teaching on changing pouch this AM when family present.   Objective: Vital signs in last 24 hours: Temp:  [97.8 F (36.6 C)-98.4 F (36.9 C)] 98.4 F (36.9 C) (03/28 0524) Pulse Rate:  [80-98] 80 (03/28 0524) Resp:  [16-18] 16 (03/28 0524) BP: (125-136)/(66-72) 125/66 (03/28 0524) SpO2:  [97 %-100 %] 97 % (03/28 0524) Last BM Date : 08/22/22  Intake/Output from previous day: 03/27 0701 - 03/28 0700 In: 150 [P.O.:150] Out: 400 [Urine:100; Stool:300] Intake/Output this shift: Total I/O In: -  Out: 150 [Urine:100; Stool:50]  PE: General: pleasant, WD, WN female who is laying in bed in NAD Lungs: Respiratory effort nonlabored Abd: soft, appropriately ttp, ND, stoma viable with liquid output in ostomy bag, incisions C/D/I Psych: A&Ox3 with an appropriate affect.    Lab Results:  Recent Labs    08/21/22 0258 08/22/22 0407  WBC 2.7* 3.8*  HGB 10.1* 10.0*  HCT 31.5* 30.5*  PLT 127* 149*    BMET Recent Labs    08/22/22 0407 08/23/22 0352  NA 136 136  K 4.0 3.4*  CL 107 106  CO2 24 25  GLUCOSE 136* 89  BUN <5* 5*  CREATININE 0.38* 0.43*  CALCIUM 7.9* 8.1*    PT/INR No results for input(s): "LABPROT", "INR" in the last 72 hours. CMP     Component Value Date/Time   NA 136 08/23/2022 0352   K 3.4 (L) 08/23/2022 0352   CL 106 08/23/2022 0352   CO2 25 08/23/2022 0352   GLUCOSE 89 08/23/2022 0352   BUN 5 (L) 08/23/2022 0352   CREATININE 0.43 (L) 08/23/2022 0352   CREATININE 0.48 08/13/2022 1235   CREATININE 0.56 03/23/2021 0000   CALCIUM 8.1 (L) 08/23/2022 0352   PROT 6.3 (L) 08/21/2022 0258   ALBUMIN 3.2 (L) 08/21/2022 0258   AST 19 08/21/2022 0258   AST 31 08/13/2022 1235   ALT 14  08/21/2022 0258   ALT 19 08/13/2022 1235   ALKPHOS 138 (H) 08/21/2022 0258   BILITOT 0.6 08/21/2022 0258   BILITOT 0.3 08/13/2022 1235   GFRNONAA >60 08/23/2022 0352   GFRNONAA >60 08/13/2022 1235   GFRNONAA 102 06/24/2018 1007   GFRAA 119 06/24/2018 1007   Lipase  No results found for: "LIPASE"     Studies/Results: No results found.  Anti-infectives: Anti-infectives (From admission, onward)    Start     Dose/Rate Route Frequency Ordered Stop   08/21/22 1000  cefoTEtan (CEFOTAN) 2 g in sodium chloride 0.9 % 100 mL IVPB        2 g 200 mL/hr over 30 Minutes Intravenous On call 08/21/22 0838 08/21/22 1201   08/18/22 1015  cefTRIAXone (ROCEPHIN) 1 g in sodium chloride 0.9 % 100 mL IVPB        1 g 200 mL/hr over 30 Minutes Intravenous  Once 08/18/22 1012 08/18/22 1144        Assessment/Plan   LBO secondary to stage IV colorectal cancer with ascending colon mass POD2 s/p laparoscopic diverting loop ileostomy  - tolerating reg diet and should continue to monitor output at home  - pain controlled on home regimen  - mobilize - stable for discharge home from a surgical standpoint, will put follow  up and instructions in AVS - WOC following for ostomy teaching, will refer to ostomy clinic    FEN: reg diet, SLIV VTE: LMWH ID: [email or web address] pre-op    LOS: 5 days     Norm Parcel, [facility] 08/23/2022, 10:23 AM Please see Amion for pager number during day hours 7:00am-4:30pm

## 2022-08-23 NOTE — Discharge Summary (Signed)
Physician Discharge Summary   Patient: Rebecca Bullock MRN: [id number] DOB: 1965-12-11  Admit date:     08/18/2022  Discharge date: 08/23/22  Discharge Physician: Murray Hodgkins   PCP: Donella Stade, PA-C   Recommendations at discharge:   * Malignant ascending colon obstruction Colon cancer metastasized to liver [facility]) Status post diverting ileostomy 3/26   Pancytopenia ([place]) Secondary to chemotherapy.    Discharge Diagnoses: Principal Problem:   Malignant ascending colon obstruction Active Problems:   Colon cancer metastasized to liver (HCC)   AKI (acute kidney injury) ([place])   Pancytopenia (HCC)   Obesity (BMI 30-39.9)  Resolved Problems:   * No resolved hospital problems. [facility] Course: 33yow PMH colon CA metastatic to liver and bone who presented with malignant colon obstruction. 3/23: Admitted with NG 3/24: GI consulted 3/25: GI determined no stent possible 3/26: To the OR for diverting loop ileostomy with Dr. Donne Hazel 3/28: Tolerating diet, ostomy education complete, cleared for discharge  * Malignant ascending colon obstruction Colon cancer metastasized to liver [facility]) Location not amenable to stenting.  Primary anastomosis not possible with recent Avastin. Status post diverting ileostomy 3/26 Improved.  Cleared for discharge per general surgery.  Follow-up with general surgery as an outpatient.   Discussed with Dr. [name] on discharge.   Obesity (BMI 30-39.9) BMI 32   Pancytopenia (HCC) Secondary to chemotherapy.  Hemoglobin stable.  Platelets and WBC trending up.   AKI (acute kidney injury) ([place]) Cr 1.46 on admission, with fluids resolved to 0.5 baseline.      Pain control - [employer] Controlled Substance Reporting System database was reviewed.    Consultants:  General surgery GI Oncology  Procedures performed:  Laparoscopic loop ileostomy   Disposition: Home Diet recommendation:  Regular diet DISCHARGE MEDICATION: Note that I  discussed with Dr. [name] and he ordered outpatient oral Dilaudid separately  Allergies as of 08/23/2022       Reactions   Augmentin [amoxicillin-pot Clavulanate] Diarrhea, Other (See Comments)   Extreme diarrhea, abdominal pain.   Irinotecan Other (See Comments)   Flushing, tingling, visual disturbance   Metronidazole Diarrhea, Other (See Comments)   Vancomycin Rash, Other (See Comments)   "Red Man Syndrome"   Latex Rash   Tape Rash   Wound Dressing Adhesive Rash        Medication List     TAKE these medications    cyclobenzaprine 10 MG tablet Commonly known as: FLEXERIL Take 1 tablet (10 mg total) by mouth 3 (three) times daily as needed for muscle spasms. What changed:  when to take this additional instructions   famotidine 40 MG tablet Commonly known as: PEPCID Take 1 tablet (40 mg total) by mouth 2 (two) times daily. What changed:  when to take this reasons to take this   FiberCon 625 MG tablet Generic drug: polycarbophil Take 1 tablet (625 mg total) by mouth 2 (two) times daily as needed for diarrhea or loose stools.   fruquintinib 5 MG capsule Commonly known as: Collie Siad Take 1 capsule (5 mg total) by mouth daily. Take for 21 days on, then off for 7 days. Repeat every 28 days.   Iron 325 (65 Fe) MG Tabs Take 1 tablet by mouth 2 times daily as needed (loose stool/high ileostomy output).   loperamide 2 MG tablet Commonly known as: IMODIUM A-D Take 2 mg by mouth 4 (four) times daily as needed for diarrhea or loose stools.   prochlorperazine 10 MG tablet Commonly known as: COMPAZINE Take 1  tablet (10 mg total) by mouth every 6 (six) hours as needed for nausea or vomiting. What changed:  when to take this additional instructions   Tylenol 8 Hour 650 MG CR tablet Generic drug: acetaminophen Take 1,300 mg by mouth every 8 (eight) hours as needed for pain.        Follow-up Information     Rolm Bookbinder, MD. Go on 09/13/2022.   Specialty:  General Surgery Why: 2:40 PM, please arrive 30 min prior to appointment time to check in. Have ID and insurance information with you. Contact information: [address] [place] [address] [phone]         [name], MD Follow up.   Specialty: Oncology Why: Office will contact you for an appointment Contact information: [address] [place] [address] [phone]                Feels ok  Discharge Exam: Danley Danker Weights   08/18/22 0943 08/21/22 1038  Weight: 79 kg 79 kg   Physical Exam Vitals reviewed.  Constitutional:      General: She is not in acute distress.    Appearance: She is not ill-appearing or toxic-appearing.  Cardiovascular:     Rate and Rhythm: Normal rate and regular rhythm.     Heart sounds: No murmur heard. Pulmonary:     Effort: Pulmonary effort is normal. No respiratory distress.     Breath sounds: No wheezing, rhonchi or rales.  Neurological:     Mental Status: She is alert.  Psychiatric:        Mood and Affect: Mood normal.        Behavior: Behavior normal.      Condition at discharge: good  The results of significant diagnostics from this hospitalization (including imaging, microbiology, ancillary and laboratory) are listed below for reference.   Imaging Studies: DG Abdomen 1 View  Result Date: 08/18/2022 CLINICAL DATA:  Nasogastric tube placement. EXAM: ABDOMEN - 1 VIEW COMPARISON:  None Available. FINDINGS: Distal tip of nasogastric tube is seen in expected position of proximal stomach. IMPRESSION: Nasogastric tube tip seen in expected position of proximal stomach. Electronically Signed   By: Marijo Conception M.D.   On: 08/18/2022 14:33   CT ABDOMEN PELVIS W CONTRAST  Result Date: 08/18/2022 CLINICAL DATA:  Colon cancer with hepatic and osseous metastatic disease, prior Y-90 radioembolization of the right hepatic lobe. Constipation, possible bowel obstruction. * Tracking Code: BO *  EXAM: CT ABDOMEN AND PELVIS WITH CONTRAST TECHNIQUE: Multidetector CT imaging of the abdomen and pelvis was performed using the standard protocol following bolus administration of intravenous contrast. RADIATION DOSE REDUCTION: This exam was performed according to the departmental dose-optimization program which includes automated exposure control, adjustment of the mA and/or kV according to patient size and/or use of iterative reconstruction technique. CONTRAST:  195mL OMNIPAQUE IOHEXOL 300 MG/ML  SOLN COMPARISON:  Multiple exams, including 06/28/2022 FINDINGS: Lower chest: 2 by 3 mm right middle lobe nodule on image 5 series 6, no change from 10/30/2021. Small type 1 hiatal hernia. Trace right pleural effusion. Hepatobiliary: Probable hepatic steatosis. Multiple gallstones measuring up to 1.7 cm in diameter. The dominant posterior right hepatic lobe mass measures 10.0 by 7.5 cm on image 20 series 2, formerly 9.1 by 7.0 cm. This lesion has peripheral and some mild internal suspected enhancement likely reflecting components of viable tumor. A small satellite lesion along the caudate lobe measures 3.0 by 2.7 cm on  image 27 series 2, formerly 2.6 by 1.9 cm. A fluid density 2.0 by 1.6 cm lesion inferiorly in the right hepatic lobe on image 43 of series 2 previously measured same. 0.8 by 0.5 cm lesion in the left hepatic lobe on image 22 series 2, too small to characterize. Small suspected cyst in segment 4 of the liver near the falciform ligament on image 25 series 2, stable. Pancreas: Unremarkable Spleen: Stable fluid density lesion in the upper spleen measuring 1.4 by 1.0 cm on image 15 series 2. Adrenals/Urinary Tract: The right posterior hepatic mass abuts but does not definitively invade the right adrenal gland. No significant renal lesion. Stomach/Bowel: Dilated loops of small bowel and proximal colon extending up to the level the ascending colon mass which measures 2.4 cm in length on image 66 series 5. The  colon distal to this mass is of normal caliber in the appearance is compatible with obstruction. Loops of small bowel measure up to 4 cm in diameter. Vascular/Lymphatic: Pathologic portacaval lymph node 1.9 cm in short axis on image 28 series 2, formerly 1.7 cm by my measurements. A right paracolic node close to the ascending colon mass measures 0.8 cm in short axis on image 45 series 2, previously 0.9 cm. A pathologic aortocaval node measures 1.2 cm in short axis on image 47 series 2, previously same by my measurements. Reproductive: Unremarkable Other: No supplemental non-categorized findings. Musculoskeletal: 1.8 cm rim sclerotic lesion in the L1 vertebral body similar to the prior exam IMPRESSION: 1. High-grade obstruction at the level of the ascending colon mass, with dilated loops of small bowel and proximal colon up to 4 cm in diameter. 2. Mild enlargement of the dominant posterior right hepatic lobe mass and satellite lesion along the caudate lobe of the liver. 3. Mild enlargement of the pathologic portacaval node. Stable prominent aortocaval and right paracolic lymph nodes. 4. Stable 1.8 cm rim sclerotic lesion in the L1 vertebral body, compatible with malignancy. 5. Stable 2 by 3 mm right middle lobe nodule. 6. Small type 1 hiatal hernia. 7. Trace right pleural effusion. 8. Cholelithiasis. 9. Probable hepatic steatosis. 10. Stable fluid density lesion in the upper spleen, probably a cyst. Electronically Signed   By: Van Clines M.D.   On: 08/18/2022 13:04   DG Abdomen 1 View  Result Date: 08/18/2022 CLINICAL DATA:  Abdominal pain, nausea and vomiting for several months, history of metastatic colon cancer EXAM: ABDOMEN - 1 VIEW COMPARISON:  07/27/2022 FINDINGS: 2 supine frontal views of the abdomen and pelvis are obtained. No bowel obstruction or ileus. No abdominal masses. Calcified gallstones again noted. No acute bony abnormalities. IMPRESSION: 1. Unremarkable bowel gas pattern. 2.  Cholelithiasis. Electronically Signed   By: Randa Ngo M.D.   On: 08/18/2022 11:13   DG Chest 2 View  Result Date: 08/18/2022 CLINICAL DATA:  Abdominal pain, nausea and vomiting for several months, history of metastatic colon cancer EXAM: CHEST - 2 VIEW COMPARISON:  06/19/2021 FINDINGS: Frontal and lateral views of the chest demonstrate a stable right chest wall port. Cardiac silhouette is unremarkable. No airspace disease, effusion, or pneumothorax. No acute bony abnormalities. IMPRESSION: 1. No acute intrathoracic process. Electronically Signed   By: Randa Ngo M.D.   On: 08/18/2022 11:12   DG Abd 2 Views  Result Date: 07/27/2022 CLINICAL DATA:  Follow-up small bowel obstruction. EXAM: ABDOMEN - 2 VIEW COMPARISON:  07/26/2022 FINDINGS: Persistent dilated loops of small bowel with air-fluid levels noted in the left hemiabdomen. These measure  up to 4 cm in diameter. Compared with the previous exam there is been increase in colonic gas. Gas is now seen within the rectum. Gallstone noted within the right upper quadrant of the abdomen. This measures 2.4 cm. IMPRESSION: 1. Persistent dilated loops of small bowel with air-fluid levels in the left hemiabdomen compatible with partial small bowel obstruction. 2. Interval increase in colonic gas with gas now seen in the rectum Electronically Signed   By: Kerby Moors M.D.   On: 07/27/2022 11:25   DG Lumbar Spine 2-3 Views  Result Date: 07/27/2022 CLINICAL DATA:  Significant lower back pain. Known L1 metastatic process EXAM: LUMBAR SPINE-2 VIEW COMPARISON:  Lumbar spine x-ray 11/23/2020.  MRI 07/02/2022 FINDINGS: Preserved vertebral body height, disc height and alignment. No listhesis. Minimal endplate osteophytes. Mild lower lumbar facet degenerative changes. Slight lucent appearance of the L1 vertebral body corresponding to the finding by MRI. IMPRESSION: Minimal degenerative change. Faint lucency involving the L1 vertebral body. Please correlate with  prior MRI findings. Electronically Signed   By: Jill Side M.D.   On: 07/27/2022 11:22   DG Abd 2 Views  Result Date: 07/27/2022 CLINICAL DATA:  Abdominal pain.  History of colon cancer. EXAM: ABDOMEN - 2 VIEW COMPARISON:  KUB June 07, 2022. CT of the abdomen and pelvis June 28, 2022. FINDINGS: Air-fluid levels are identified in the small bowel on upright imaging. Dilated loops of small bowel are noted on supine imaging measuring up to 3.9 cm. There is a relative paucity of gas in the small bowel. Lung bases are normal. No free air, portal venous gas, or pneumatosis. Visualized bones are normal. A calcification in the right side of the abdomen correlates with cholelithiasis identified on CT imaging. IMPRESSION: 1. The findings are consistent with a small-bowel obstruction. Given the air in the descending colon, the obstruction could be early or partial. 2. Cholelithiasis. 3. No other abnormalities. These results will be called to the ordering clinician or representative by the Radiologist Assistant, and communication documented in the PACS or [employer]. Electronically Signed   By: Dorise Bullion III M.D.   On: 07/27/2022 09:53    Microbiology: Results for orders placed or performed during the hospital encounter of 06/19/21  Blood Culture (routine x 2)     Status: None   Collection Time: 06/19/21 11:14 PM   Specimen: BLOOD  Result Value Ref Range Status   Specimen Description   Final    BLOOD RIGHT ANTECUBITAL Performed at [facility] [address] [place], [place] [address]    Special Requests   Final    BOTTLES DRAWN AEROBIC AND ANAEROBIC Blood Culture adequate volume Performed at [facility] [address] [place], [place] [address]    Culture   Final    NO GROWTH 5 DAYS Performed at [facility], [place] [address], [place], [place] [address]    Report Status 06/25/2021 FINAL  Final  Blood Culture (routine x 2)     Status: None    Collection Time: 06/19/21 11:55 PM   Specimen: BLOOD  Result Value Ref Range Status   Specimen Description   Final    BLOOD LEFT ANTECUBITAL Performed at [facility] [address], [place], [place] [address]    Special Requests   Final    BOTTLES DRAWN AEROBIC AND ANAEROBIC Blood Culture adequate volume Performed at [facility] [address], [place], [place] [address]    Culture  Final    NO GROWTH 5 DAYS Performed at [facility], [place] [address], [place], [place] [address]    Report Status 06/25/2021 FINAL  Final  Resp Panel by RT-PCR (Flu A&B, Covid) Nasopharyngeal Swab     Status: None   Collection Time: 06/20/21 12:00 AM   Specimen: Nasopharyngeal Swab; Nasopharyngeal(NP) swabs in vial transport medium  Result Value Ref Range Status   SARS Coronavirus 2 by RT PCR NEGATIVE NEGATIVE Final    Comment: (NOTE) SARS-CoV-2 target nucleic acids are NOT DETECTED.  The SARS-CoV-2 RNA is generally detectable in upper respiratory specimens during the acute phase of infection. The lowest concentration of SARS-CoV-2 viral copies this assay can detect is 138 copies/mL. A negative result does not preclude SARS-Cov-2 infection and should not be used as the sole basis for treatment or other patient management decisions. A negative result may occur with  improper specimen collection/handling, submission of specimen other than nasopharyngeal swab, presence of viral mutation(s) within the areas targeted by this assay, and inadequate number of viral copies(<138 copies/mL). A negative result must be combined with clinical observations, patient history, and epidemiological information. The expected result is Negative.  Fact Sheet for Patients:  EntrepreneurPulse.com.au  Fact Sheet for Healthcare Providers:  IncredibleEmployment.be  This test is no t yet approved or cleared by the [place] FDA and   has been authorized for detection and/or diagnosis of SARS-CoV-2 by FDA under an Emergency Use Authorization (EUA). This EUA will remain  in effect (meaning this test can be used) for the duration of the COVID-19 declaration under Section 564(b)(1) of the Act, 21 U.S.C.section 360bbb-3(b)(1), unless the authorization is terminated  or revoked sooner.       Influenza A by PCR NEGATIVE NEGATIVE Final   Influenza B by PCR NEGATIVE NEGATIVE Final    Comment: (NOTE) The Xpert Xpress SARS-CoV-2/FLU/RSV plus assay is intended as an aid in the diagnosis of influenza from Nasopharyngeal swab specimens and should not be used as a sole basis for treatment. Nasal washings and aspirates are unacceptable for Xpert Xpress SARS-CoV-2/FLU/RSV testing.  Fact Sheet for Patients: EntrepreneurPulse.com.au  Fact Sheet for Healthcare Providers: IncredibleEmployment.be  This test is not yet approved or cleared by the [place] FDA and has been authorized for detection and/or diagnosis of SARS-CoV-2 by FDA under an Emergency Use Authorization (EUA). This EUA will remain in effect (meaning this test can be used) for the duration of the COVID-19 declaration under Section 564(b)(1) of the Act, 21 U.S.C. section 360bbb-3(b)(1), unless the authorization is terminated or revoked.  Performed at [facility], [place] [address], [place], [place] [address]   Urine Culture     Status: Abnormal   Collection Time: 06/20/21  1:00 AM   Specimen: In/Out Cath Urine  Result Value Ref Range Status   Specimen Description   Final    IN/OUT CATH URINE Performed at [facility] [address] [place], [place] [address]    Special Requests   Final    NONE Performed at [facility], [place] [address] [place], [place] [address]    Culture (A)  Final    <10,000 COLONIES/mL INSIGNIFICANT GROWTH Performed at [facility] [address] [place],  [address]    Report Status 06/21/2021 FINAL  Final    Labs: CBC: Recent Labs  Lab 08/18/22 1049 08/19/22 0233 08/20/22 0239 08/21/22 0258 08/22/22 0407  WBC 6.0 3.0* 2.5* 2.7* 3.8*  NEUTROABS  4.8  --   --   --   --   HGB 14.9 12.0 9.9* 10.1* 10.0*  HCT 45.4 38.7 30.9* 31.5* 30.5*  MCV 92.3 98.2 96.3 93.8 93.0  PLT 218 133* 102* 127* [id number]*   Basic Metabolic Panel: Recent Labs  Lab 08/19/22 0233 08/20/22 0239 08/21/22 0258 08/22/22 0407 08/23/22 0352  NA 142 137 135 136 136  K 3.9 3.9 3.8 4.0 3.4*  CL 111 106 105 107 106  CO2 24 21* 24 24 25   GLUCOSE 84 79 120* 136* 89  BUN 11 <5* <5* <5* 5*  CREATININE 0.55 0.34* 0.36* 0.38* 0.43*  CALCIUM 7.8* 7.4* 7.7* 7.9* 8.1*   Liver Function Tests: Recent Labs  Lab 08/18/22 1049 08/19/22 0233 08/20/22 0239 08/21/22 0258  AST 32 25 21 19   ALT 29 21 15 14   ALKPHOS 223* 153* 134* 138*  BILITOT 1.1 0.7 0.9 0.6  PROT 8.2* 6.6 5.9* 6.3*  ALBUMIN 4.1 3.3* 3.2* 3.2*   CBG: No results for input(s): "GLUCAP" in the last 168 hours.  Discharge time spent: greater than 30 minutes.  Signed: Murray Hodgkins, MD [facility] 08/23/2022

## 2022-08-23 NOTE — Consult Note (Addendum)
[facility] Nurse ostomy consult note Pt has requested a pouch change and teaching session at 1000; I will return at that time.  She was in the bathroom when I checked on her this morning and was able to open and close the velcro to empty in the toilet without assistance. Discussed ordering supplies and pouching routines.  Discussed dietary precautions and importance of avoiding dehydration.  Reviewed ability to shower and provided information regarding a support belt if desired.  Educational materials left at bedside, along with 5 sets of each supply; barrier rings, Kellie Simmering [phone] and flexible convex pouches Lawson # [id number]. Discussed ordering supplies after discharge.  Enrolled patient in [place] program: Yes, today  1000 Post-note:  Previous pouch was leaking behind the barrier when removed. Peristomal skin is intact.  Stoma is 2 inches. Pt assisted with pouch change; she was able to stretch the barrier to fit the opening and apply the pouch over the stoma. 80cc liquid green-brown stool in the pouch.  Stoma is red and viable, slightly above the skin level, rubber rod in place and sutured tightly to the skin; this has created a difficult pouching situation since I am cutting the wafer as wide as possible on the flexible convex pouch and it is still not large enough to accommodate the rod.  Demonstrated to patient how to stretch the opening wider to make sure the rod is enclosed inside the opening.  This will be difficult to perform until surgical team orders the rod to be discontinued.  Recommend referral to outpatient ostomy clinic for further assistance, since Home health is not available, according to Social Work.  Daughter at bedside to watch the pouch change process and ask appropriate questions. Provided with 7 sets of each supply for use after discharge; use barrier rings, Lawson # [phone] and one piece convex pouches Lawson # [id number].  Demonstrated use of ostomy belt and provided her with one.   Thank-you,  Julien Girt MSN, [place], [place], [place], [place]

## 2022-08-24 ENCOUNTER — Other Ambulatory Visit (HOSPITAL_COMMUNITY): Payer: Self-pay

## 2022-08-24 ENCOUNTER — Other Ambulatory Visit (HOSPITAL_BASED_OUTPATIENT_CLINIC_OR_DEPARTMENT_OTHER): Payer: Self-pay

## 2022-08-27 ENCOUNTER — Ambulatory Visit
Admission: RE | Admit: 2022-08-27 | Discharge: 2022-08-27 | Disposition: A | Discharge status: Home / Self Care | Payer: Medicaid Other | Source: Ambulatory Visit | Attending: Radiation Oncology | Admitting: Radiation Oncology

## 2022-08-27 HISTORY — DX: Personal history of irradiation: Z92.3

## 2022-08-28 ENCOUNTER — Inpatient Hospital Stay (HOSPITAL_BASED_OUTPATIENT_CLINIC_OR_DEPARTMENT_OTHER): Payer: Medicaid Other | Admitting: Hematology & Oncology

## 2022-08-28 ENCOUNTER — Ambulatory Visit: Payer: Medicaid Other | Admitting: Hematology & Oncology

## 2022-08-28 ENCOUNTER — Other Ambulatory Visit (HOSPITAL_BASED_OUTPATIENT_CLINIC_OR_DEPARTMENT_OTHER): Payer: Self-pay

## 2022-08-28 ENCOUNTER — Inpatient Hospital Stay: Payer: Medicaid Other

## 2022-08-28 ENCOUNTER — Other Ambulatory Visit: Payer: Self-pay

## 2022-08-28 ENCOUNTER — Other Ambulatory Visit: Payer: Medicaid Other

## 2022-08-28 ENCOUNTER — Ambulatory Visit: Payer: Medicaid Other

## 2022-08-28 ENCOUNTER — Encounter: Payer: Self-pay | Admitting: Hematology & Oncology

## 2022-08-28 ENCOUNTER — Inpatient Hospital Stay: Payer: Medicaid Other | Attending: Hematology & Oncology

## 2022-08-28 VITALS — BP 115/69 | HR 87 | Resp 16

## 2022-08-28 VITALS — BP 117/82 | HR 106 | Temp 97.6°F | Resp 16 | Ht 61.0 in | Wt 171.0 lb

## 2022-08-28 DIAGNOSIS — R52 Pain, unspecified: Secondary | ICD-10-CM | POA: Diagnosis not present

## 2022-08-28 DIAGNOSIS — Z515 Encounter for palliative care: Secondary | ICD-10-CM | POA: Diagnosis not present

## 2022-08-28 DIAGNOSIS — C19 Malignant neoplasm of rectosigmoid junction: Secondary | ICD-10-CM | POA: Insufficient documentation

## 2022-08-28 DIAGNOSIS — C7951 Secondary malignant neoplasm of bone: Secondary | ICD-10-CM | POA: Diagnosis not present

## 2022-08-28 DIAGNOSIS — Z95828 Presence of other vascular implants and grafts: Secondary | ICD-10-CM

## 2022-08-28 DIAGNOSIS — C787 Secondary malignant neoplasm of liver and intrahepatic bile duct: Secondary | ICD-10-CM | POA: Diagnosis not present

## 2022-08-28 DIAGNOSIS — R110 Nausea: Secondary | ICD-10-CM

## 2022-08-28 DIAGNOSIS — C189 Malignant neoplasm of colon, unspecified: Secondary | ICD-10-CM

## 2022-08-28 LAB — CMP (CANCER CENTER ONLY)
ALT: 42 U/L (ref 0–44)
AST: 53 U/L — ABNORMAL HIGH (ref 15–41)
Albumin: 4 g/dL (ref 3.5–5.0)
Alkaline Phosphatase: 323 U/L — ABNORMAL HIGH (ref 38–126)
Anion gap: 8 (ref 5–15)
BUN: 7 mg/dL (ref 6–20)
CO2: 28 mmol/L (ref 22–32)
Calcium: 9.4 mg/dL (ref 8.9–10.3)
Chloride: 101 mmol/L (ref 98–111)
Creatinine: 0.45 mg/dL (ref 0.44–1.00)
GFR, Estimated: >60 mL/min (ref >60)
Glucose, Bld: 95 mg/dL (ref 70–99)
Potassium: 3.6 mmol/L (ref 3.5–5.1)
Sodium: 137 mmol/L (ref 135–145)
Total Bilirubin: 0.6 mg/dL (ref 0.3–1.2)
Total Protein: 7.4 g/dL (ref 6.5–8.1)

## 2022-08-28 LAB — CBC WITH DIFFERENTIAL (CANCER CENTER ONLY)
Abs Immature Granulocytes: 0 10*3/uL (ref 0.00–0.07)
Basophils Absolute: 0 10*3/uL (ref 0.0–0.1)
Basophils Relative: 0 %
Eosinophils Absolute: 0.1 10*3/uL (ref 0.0–0.5)
Eosinophils Relative: 3 %
HCT: 34.9 % — ABNORMAL LOW (ref 36.0–46.0)
Hemoglobin: 11.3 g/dL — ABNORMAL LOW (ref 12.0–15.0)
Immature Granulocytes: 0 %
Lymphocytes Relative: 25 %
Lymphs Abs: 0.6 10*3/uL — ABNORMAL LOW (ref 0.7–4.0)
MCH: 30.1 pg (ref 26.0–34.0)
MCHC: 32.4 g/dL (ref 30.0–36.0)
MCV: 93.1 fL (ref 80.0–100.0)
Monocytes Absolute: 0.5 10*3/uL (ref 0.1–1.0)
Monocytes Relative: 17 %
Neutro Abs: 1.4 10*3/uL — ABNORMAL LOW (ref 1.7–7.7)
Neutrophils Relative %: 55 %
Platelet Count: 164 10*3/uL (ref 150–400)
RBC: 3.75 MIL/uL — ABNORMAL LOW (ref 3.87–5.11)
RDW: 16.9 % — ABNORMAL HIGH (ref 11.5–15.5)
WBC Count: 2.6 10*3/uL — ABNORMAL LOW (ref 4.0–10.5)
nRBC: 0 % (ref 0.0–0.2)

## 2022-08-28 LAB — LACTATE DEHYDROGENASE: LDH: 481 U/L — ABNORMAL HIGH (ref 98–192)

## 2022-08-28 LAB — IRON AND IRON BINDING CAPACITY (CC-WL,HP ONLY)
Iron: 72 ug/dL (ref 28–170)
Saturation Ratios: 23 % (ref 10.4–31.8)
TIBC: 316 ug/dL (ref 250–450)
UIBC: 244 ug/dL (ref 148–442)

## 2022-08-28 LAB — CEA (IN HOUSE-CHCC): CEA (CHCC-In House): 15.23 ng/mL — ABNORMAL HIGH (ref 0.00–5.00)

## 2022-08-28 LAB — FERRITIN: Ferritin: 303 ng/mL (ref 11–307)

## 2022-08-28 MED ORDER — HYDROMORPHONE HCL 1 MG/ML IJ SOLN
1.0000 mg | Freq: Once | INTRAMUSCULAR | Status: AC
  Administered 2022-08-28: 1 mg via INTRAVENOUS
  Filled 2022-08-28: qty 1

## 2022-08-28 MED ORDER — SODIUM CHLORIDE 0.9% FLUSH
10.0000 mL | INTRAVENOUS | Status: DC | PRN
  Administered 2022-08-28: 10 mL via INTRAVENOUS

## 2022-08-28 MED ORDER — HEPARIN SOD (PORK) LOCK FLUSH 100 UNIT/ML IV SOLN
500.0000 [IU] | Freq: Once | INTRAVENOUS | Status: AC
  Administered 2022-08-28: 500 [IU] via INTRAVENOUS

## 2022-08-28 MED ORDER — SODIUM CHLORIDE 0.9 % IV SOLN: Freq: Once | INTRAVENOUS | Status: AC

## 2022-08-28 MED ORDER — HYDROMORPHONE HCL 1 MG/ML IJ SOLN: 1.0000 mg | INTRAMUSCULAR | Status: DC | PRN

## 2022-08-28 MED ORDER — BACLOFEN 10 MG PO TABS
10.0000 mg | ORAL_TABLET | Freq: Three times a day (TID) | ORAL | 2 refills | Status: DC
  Filled 2022-08-28: qty 60, 20d supply, fill #0
  Filled 2022-09-20: qty 60, 20d supply, fill #1
  Filled 2022-10-08: qty 60, 20d supply, fill #2

## 2022-08-28 NOTE — Progress Notes (Signed)
Hematology and Oncology Follow Up Visit  Rebecca Bullock NX:[id number] 06/03/1965 57 y.o. 08/28/2022   Principle Diagnosis:  Metastatic colorectal cancer-liver metastasis- BRAF (+)   Current Therapy:        Status post cycle 1 of chemotherapy with FOLFOXIRI/Avastin Encorafenib/Vectibix -- started on 06/19/2021, s/p cycle #6 -- d/c on 10/31/2021 Intrahepatic TACE -- 03/29/2022 FOLFOXIRI/Avastin-- s/p cycle  #5-- start on 02/19/2022, -oxaliplatin dropped on 04/03/2022 -- d/c on 07/03/2022 IV iron-Ferrlecit given on 05/10/2022  Lonsurf/Avastin -- s/p cycle #1 -- start on 07/07/2022 -DC on 08/15/2022 Zometa 4 mg IV every 3 months -next dose on 09/2022  XRT to L1 vertebral body --start on 07/10/2022 Fruquintinib 5 mg po q day (21/7) --  start on 04/`09/2022               Interim History:  Rebecca Bullock is here today for follow-up.  She was recently hospitalized because of bowel obstruction.  She needed a colostomy.  She underwent a colostomy on 08/21/2022.  She did well with this.  The colostomy is functioning nicely.  The colostomy bag is little bit on the tight side.  I think she sees Surgery in a couple week.  She is having some back spasms.  I will try her on some baclofen to see if this may help.  She is going start the fruquintinib on 09/12/2022.  This way, this will give her a couple weeks to have healed up from surgery.  What she says, the surgeon will try to reverse the colostomy if possible.  We really do not have a lot of options left as far as her therapy goes.  Hopefully, the Fruquintinib will be able to help her out.  Thankfully, she is having no problems with vomiting.  Overall, pain wise I think she is doing pretty well.  Her last CEA level on 08/13/2022 was 12.3.  Currently, I would say that her performance status is probably ECOG 1.   Medications:  Allergies as of 08/28/2022       Reactions   Augmentin [amoxicillin-pot Clavulanate] Diarrhea, Other (See Comments)   Extreme  diarrhea, abdominal pain.   Irinotecan Other (See Comments)   Flushing, tingling, visual disturbance   Metronidazole Diarrhea, Other (See Comments)   Vancomycin Rash, Other (See Comments)   "Red Man Syndrome"   Latex Rash   Tape Rash   Wound Dressing Adhesive Rash        Medication List        Accurate as of August 28, 2022 10:07 AM. If you have any questions, ask your nurse or doctor.          baclofen 10 MG tablet Commonly known as: LIORESAL Take 1 tablet (10 mg total) by mouth 3 (three) times daily. Started by: [name], MD   cyclobenzaprine 10 MG tablet Commonly known as: FLEXERIL Take 1 tablet (10 mg total) by mouth 3 (three) times daily as needed for muscle spasms. What changed:  when to take this additional instructions   famotidine 40 MG tablet Commonly known as: PEPCID Take 1 tablet (40 mg total) by mouth 2 (two) times daily.   FiberCon 625 MG tablet Generic drug: polycarbophil Take 1 tablet (625 mg total) by mouth 2 (two) times daily as needed for diarrhea or loose stools.   fruquintinib 5 MG capsule Commonly known as: Collie Siad Take 1 capsule (5 mg total) by mouth daily. Take for 21 days on, then off for 7 days. Repeat every 28 days.  HYDROmorphone 2 MG tablet Commonly known as: Dilaudid Take 1 tablet (2 mg total) by mouth every 4 (four) hours as needed for severe pain.   Iron 325 (65 Fe) MG Tabs Take 1 tablet by mouth 2 times daily as needed (loose stool/high ileostomy output).   loperamide 2 MG tablet Commonly known as: IMODIUM A-D Take 2 mg by mouth 4 (four) times daily as needed for diarrhea or loose stools.   prochlorperazine 10 MG tablet Commonly known as: COMPAZINE Take 1 tablet (10 mg total) by mouth every 6 (six) hours as needed for nausea or vomiting.   Tylenol 8 Hour 650 MG CR tablet Generic drug: acetaminophen Take 1,300 mg by mouth every 8 (eight) hours as needed for pain.        Allergies:  Allergies  Allergen  Reactions   Augmentin [Amoxicillin-Pot Clavulanate] Diarrhea and Other (See Comments)    Extreme diarrhea, abdominal pain.   Irinotecan Other (See Comments)    Flushing, tingling, visual disturbance   Metronidazole Diarrhea and Other (See Comments)   Vancomycin Rash and Other (See Comments)    "Red Man Syndrome"   Latex Rash   Tape Rash   Wound Dressing Adhesive Rash    Past Medical History, Surgical history, Social history, and Family History were reviewed and updated.  Review of Systems: Review of Systems  Constitutional: Negative.   HENT: Negative.    Eyes: Negative.   Respiratory: Negative.    Cardiovascular: Negative.   Gastrointestinal: Negative.        Constipation alternating with diarrhea.   Genitourinary: Negative.   Musculoskeletal: Negative.   Skin: Negative.   Neurological: Negative.   Endo/Heme/Allergies: Negative.   Psychiatric/Behavioral: Negative.       Physical Exam: Her vital signs show temperature of 97 6.  Pulse 106.  Blood pressure 111/82.  Weight is 171 pounds.      Wt Readings from Last 3 Encounters:  08/28/22 171 lb (77.6 kg)  08/21/22 174 lb 2.6 oz (79 kg)  08/13/22 174 lb (78.9 kg)    Physical Exam Vitals reviewed.  HENT:     Head: Normocephalic and atraumatic.  Eyes:     Pupils: Pupils are equal, round, and reactive to light.  Cardiovascular:     Rate and Rhythm: Normal rate and regular rhythm.     Heart sounds: Normal heart sounds.  Pulmonary:     Effort: Pulmonary effort is normal.     Breath sounds: Normal breath sounds.  Abdominal:     General: Bowel sounds are normal.     Palpations: Abdomen is soft.     Comments: Abdominal exam is soft.  She has a colostomy in the right lower quadrant.  She has no fluid wave.  There is no guarding or rebound tenderness.  There is no hepatomegaly.  Musculoskeletal:        General: No tenderness or deformity. Normal range of motion.     Cervical back: Normal range of motion.   Lymphadenopathy:     Cervical: No cervical adenopathy.  Skin:    General: Skin is warm and dry.     Findings: No erythema or rash.  Neurological:     Mental Status: She is alert and oriented to person, place, and time.  Psychiatric:        Behavior: Behavior normal.        Thought Content: Thought content normal.        Judgment: Judgment normal.      Lab Results  Component Value Date   WBC 2.6 (L) 08/28/2022   HGB 11.3 (L) 08/28/2022   HCT 34.9 (L) 08/28/2022   MCV 93.1 08/28/2022   PLT 164 08/28/2022   Lab Results  Component Value Date   FERRITIN 243 08/07/2022   IRON 86 08/07/2022   TIBC 287 08/07/2022   UIBC 201 08/07/2022   IRONPCTSAT 30 08/07/2022   Lab Results  Component Value Date   RETICCTPCT 1.6 08/07/2022   RBC 3.75 (L) 08/28/2022   No results found for: "KPAFRELGTCHN", "LAMBDASER", "KAPLAMBRATIO" No results found for: "IGGSERUM", "IGA", "IGMSERUM" No results found for: "TOTALPROTELP", "ALBUMINELP", "A1GS", "A2GS", "BETS", "BETA2SER", "GAMS", "MSPIKE", "SPEI"   Chemistry      Component Value Date/Time   NA 137 08/28/2022 0850   K 3.6 08/28/2022 0850   CL 101 08/28/2022 0850   CO2 28 08/28/2022 0850   BUN 7 08/28/2022 0850   CREATININE 0.45 08/28/2022 0850   CREATININE 0.56 03/23/2021 0000      Component Value Date/Time   CALCIUM 9.4 08/28/2022 0850   ALKPHOS 323 (H) 08/28/2022 0850   AST 53 (H) 08/28/2022 0850   ALT 42 08/28/2022 0850   BILITOT 0.6 08/28/2022 0850       Impression and Plan: Ms. Helke is a very charming  57 yo caucasian female with metastatic colon cancer.  She had disease confined to her liver.  However, she then began to have progression.  She had disease in her L1 vertebral body.  She has received radiation therapy to this.  We have her on Lonsurf.  She started her second cycle of Lonsurf.  Unfortunately, she cannot have the Lonsurf.  As such, we have switched over to the new FDA Fruquintinib.  She requires the emergency  surgery for the bowel obstruction.  We will start the Fruquintinib on 09/10/2022.  She hopefully will not have problems with abdominal pain.  Hopefully, the baclofen will help with her back spasms.  I know she is trying hard.  I give her some which credit for doing as much that she is doing.  She is a Nurse, adult.  She has a very strong constitution.  I would like to see her back in a month.  By then, she would have started the Fruquintinib.   [name], MD 4/2/202410:07 AM

## 2022-08-28 NOTE — Patient Instructions (Signed)

## 2022-08-28 NOTE — Patient Instructions (Signed)
Dehydration, Adult Dehydration is a condition in which there is not enough water or other fluids in the body. This happens when a person loses more fluids than they take in. Important organs cannot work right without the right amount of fluids. Any loss of fluids from the body can cause dehydration. Dehydration can be mild, worse, or very bad. It should be treated right away to keep it from getting very bad. What are the causes? Conditions that cause loss of water in the body. They include: Watery poop (diarrhea). Vomiting. Sweating a lot. Fever. Infection. Peeing (urinating) a lot. Not drinking enough fluids. Certain medicines, such as medicines that take extra fluid out of the body (diuretics). Lack of safe drinking water. Not being able to get enough water and food. What increases the risk? Having a long-term (chronic) illness that has not been treated the right way, such as: Diabetes. Heart disease. Kidney disease. Being 65 years of age or older. Having a disability. Living in a place that is high above the ground or sea (high in altitude). The thinner, drier air causes more fluid loss. Doing exercises that put stress on your body for a long time. Being active when in hot places. What are the signs or symptoms? Symptoms of dehydration depend on how bad it is. Mild or worse dehydration Thirst. Dry lips or dry mouth. Feeling dizzy or light-headed. Muscle cramps. Passing little pee or dark pee. Pee may be the color of tea. Headache. Very bad dehydration Changes in skin. Skin may: Be cold to the touch (clammy). Be blotchy or pale. Not go back to normal right after you pinch it and let it go. Little or no tears, pee, or sweat. Fast breathing. Low blood pressure. Weak pulse. Pulse that is more than 100 beats a minute when you are sitting still. Other changes, such as: Feeling very thirsty. Eyes that look hollow (sunken). Cold hands and feet. Being confused. Being very  tired (lethargic) or having trouble waking from sleep. Losing weight. Loss of consciousness. How is this treated? Treatment for this condition depends on how bad your dehydration is. Treatment should start right away. Do not wait until your condition gets very bad. Very bad dehydration is an emergency. You will need to go to a hospital. Mild or worse dehydration can be treated at home. You may be asked to: Drink more fluids. Drink an oral rehydration solution (ORS). This drink gives you the right amount of fluids, salts, and minerals (electrolytes). Very bad dehydration can be treated: With fluids through an IV tube. By correcting low levels of electrolytes in the body. By treating the problem that caused your dehydration. Follow these instructions at home: Oral rehydration solution If told by your doctor, drink an ORS: Make an ORS. Use instructions on the package. Start by drinking small amounts, about  cup (120 mL) every 5-10 minutes. Slowly drink more until you have had the amount that your doctor said to have.  Eating and drinking  Drink enough clear fluid to keep your pee pale yellow. If you were told to drink an ORS, finish the ORS first. Then, start slowly drinking other clear fluids. Drink fluids such as: Water. Do not drink only water. Doing that can make the salt (sodium) level in your body get too low. Water from ice chips you suck on. Fruit juice that you have added water to (diluted). Low-calorie sports drinks. Eat foods that have the right amounts of salts and minerals, such as bananas, oranges, potatoes,   tomatoes, or spinach. Do not drink alcohol. Avoid drinks that have caffeine or sugar. These include:: High-calorie sports drinks. Fruit juice that you did not add water to. Soda. Coffee or energy drinks. Avoid foods that are greasy or have a lot of fat or sugar. General instructions Take over-the-counter and prescription medicines only as told by your doctor. Do  not take sodium tablets. Doing that can make the salt level in your body get too high. Return to your normal activities as told by your doctor. Ask your doctor what activities are safe for you. Keep all follow-up visits. Your doctor may check and change your treatment. Contact a doctor if: You have pain in your belly (abdomen) and the pain: Gets worse. Stays in one place. You have a rash. You have a stiff neck. You get angry or annoyed more easily than normal. You are more tired or have a harder time waking than normal. You feel weak or dizzy. You feel very thirsty. Get help right away if: You have any symptoms of very bad dehydration. You vomit every time you eat or drink. Your vomiting gets worse, does not go away, or you vomit blood or green stuff. You are getting treatment, but symptoms are getting worse. You have a fever. You have a very bad headache. You have: Diarrhea that gets worse or does not go away. Blood in your poop (stool). This may cause poop to look black and tarry. No pee in 6-8 hours. Only a small amount of pee in 6-8 hours, and the pee is very dark. You have trouble breathing. These symptoms may be an emergency. Get help right away. Call 911. Do not wait to see if the symptoms will go away. Do not drive yourself to the hospital. This information is not intended to replace advice given to you by your health care provider. Make sure you discuss any questions you have with your health care provider. Document Revised: 12/11/2021 Document Reviewed: 12/11/2021 Elsevier Patient Education  2023 Elsevier Inc.  

## 2022-08-29 ENCOUNTER — Ambulatory Visit: Payer: Medicaid Other | Admitting: Dietician

## 2022-08-29 ENCOUNTER — Telehealth: Payer: Self-pay | Admitting: *Deleted

## 2022-08-29 NOTE — Progress Notes (Signed)
Nutrition Follow Up:   Called patient on home/cell phone to monitor PO intake and NIS.  She was recently hospitalized with a bowel obstruction and is s/p ileostomy.  She reports her biggest concern now is back spasms that are so painful she can't stand to do any cooking.  The pain med she picked up yesterday doesn't seem to be helping.  Her daughter is trying to help but she's not a good cook.  Her treatment Collie Siad) are planned to begin in 2 weeks.  Little change with her diet Greek yogurts, Cheerios also pasta, last night had ham sandwich.  She's trying to increase fluids with milk, orange juice.      Medications: baclofen for pain/spasms  Labs: *08/28/22  Hgb 11.3**   Anthropometrics: lost 7# past 2 weeks   Height: 61" Weight:  08/28/22  171#  08/07/22  178# 08/13/22  174# 07/26/22  178.3 07/17/22  188# UBW: 185# BMI: 32.31    Estimated Energy Needs  Kcals: [id number] Protein: 94-117 Fluid: 3 L  NUTRITION DIAGNOSIS: Inadequate PO intake for increased needs   INTERVENTION:  Encouraged small frequent feeds and trying to eat 6 small meals. Suggested increased use of Greek yogurt as lean protein source. Encouraged avoiding raw fruits and vegetables yet and any skins and seeds. Encouraged patient to reach out to MD/RN to discuss pain control. Reviewed nutrients needed for healing. Encouraged rest, protein, fluids repeat.   MONITORING, EVALUATION, GOAL: weight, PO intake, Nutrition Impact Symptoms, labs Goal is weight maintenance.   Next Visit: Remote after treatment resumes   April Manson, RDN, LDN Registered Dietitian, [place] Part Time Remote (Usual office hours: Tuesday-Thursday) Cell: [phone]

## 2022-08-29 NOTE — Telephone Encounter (Signed)
PAtient called stating that she has not had any relief from the Baclofen prescribed yesterday by Dr [name].  Dr [name] notified.  Wants patient to stay on this medication for a few days for optimal effect.  Patient in agreement.

## 2022-08-30 ENCOUNTER — Ambulatory Visit: Payer: Medicaid Other | Admitting: Hematology & Oncology

## 2022-08-30 ENCOUNTER — Other Ambulatory Visit: Payer: Medicaid Other

## 2022-08-30 ENCOUNTER — Other Ambulatory Visit: Payer: Self-pay

## 2022-08-30 ENCOUNTER — Inpatient Hospital Stay: Payer: Medicaid Other

## 2022-08-30 ENCOUNTER — Ambulatory Visit: Payer: Medicaid Other

## 2022-08-30 ENCOUNTER — Ambulatory Visit
Admission: RE | Admit: 2022-08-30 | Discharge: 2022-08-30 | Disposition: A | Discharge status: Home / Self Care | Payer: Medicaid Other | Source: Ambulatory Visit | Attending: Radiation Oncology | Admitting: Radiation Oncology

## 2022-08-31 ENCOUNTER — Other Ambulatory Visit (HOSPITAL_COMMUNITY): Payer: Self-pay

## 2022-08-31 ENCOUNTER — Other Ambulatory Visit: Payer: Self-pay

## 2022-08-31 ENCOUNTER — Ambulatory Visit (HOSPITAL_COMMUNITY)
Admission: RE | Admit: 2022-08-31 | Discharge: 2022-08-31 | Disposition: A | Discharge status: Home / Self Care | Payer: Medicaid Other | Source: Ambulatory Visit | Attending: Nurse Practitioner | Admitting: Nurse Practitioner

## 2022-08-31 DIAGNOSIS — K9409 Other complications of colostomy: Secondary | ICD-10-CM | POA: Diagnosis present

## 2022-08-31 DIAGNOSIS — K9400 Colostomy complication, unspecified: Secondary | ICD-10-CM

## 2022-08-31 DIAGNOSIS — L259 Unspecified contact dermatitis, unspecified cause: Secondary | ICD-10-CM | POA: Insufficient documentation

## 2022-08-31 DIAGNOSIS — L24B3 Irritant contact dermatitis related to fecal or urinary stoma or fistula: Secondary | ICD-10-CM

## 2022-08-31 DIAGNOSIS — C189 Malignant neoplasm of colon, unspecified: Secondary | ICD-10-CM | POA: Diagnosis not present

## 2022-08-31 NOTE — Discharge Instructions (Signed)
Switching to 2 piece pouch with barrier ring Remove old pouch and clean with soap and water. Pat dry Apply stoma powder and skin prep  allow to air dry Pull barrier ring in half and wrap around stoma.  Rod will be visible Cut pouch opening oval (pattern provided)  Apply belt

## 2022-08-31 NOTE — Progress Notes (Signed)
[facility]   Reason for visit:  RLQ ileostomy HPI:  Colon cancer with resection, ileostomy Past Medical History:  Diagnosis Date   Colon cancer metastasized to liver 05/05/2021   Family history of lung cancer    Family history of pancreatic cancer    Goals of care, counseling/discussion 05/05/2021   History of radiation therapy    Lumbar Spine- 07/12/22-07/25/22- Dr. Antony Blackbird   Family History  Problem Relation Age of Onset   Lung cancer Mother    Diabetes Maternal Grandmother    Pancreatic cancer Maternal Grandfather    Multiple sclerosis Maternal Aunt    Alcohol abuse Maternal Uncle    Allergies  Allergen Reactions   Augmentin [Amoxicillin-Pot Clavulanate] Diarrhea and Other (See Comments)    Extreme diarrhea, abdominal pain.   Irinotecan Other (See Comments)    Flushing, tingling, visual disturbance   Metronidazole Diarrhea and Other (See Comments)   Vancomycin Rash and Other (See Comments)    "Red Man Syndrome"   Latex Rash   Tape Rash   Wound Dressing Adhesive Rash   Current Outpatient Medications  Medication Sig Dispense Refill Last Dose   baclofen (LIORESAL) 10 MG tablet Take 1 tablet (10 mg total) by mouth 3 (three) times daily. 60 each 2    cyclobenzaprine (FLEXERIL) 10 MG tablet Take 1 tablet (10 mg total) by mouth 3 (three) times daily as needed for muscle spasms. (Patient taking differently: Take 10 mg by mouth See admin instructions. Take 10 mg by mouth in the morning and at bedtime- and an additional 10 mg once a day as needed for muscle spasms) 90 tablet 3    famotidine (PEPCID) 40 MG tablet Take 1 tablet (40 mg total) by mouth 2 (two) times daily. (Patient not taking: Reported on 08/28/2022) 60 tablet 4    Ferrous Sulfate (IRON) 325 (65 Fe) MG TABS Take 1 tablet by mouth 2 times daily as needed (loose stool/high ileostomy output). (Patient not taking: Reported on 08/28/2022) 60 tablet 0    fruquintinib (FRUZAQLA) 5 MG capsule Take 1 capsule (5  mg total) by mouth daily. Take for 21 days on, then off for 7 days. Repeat every 28 days. (Patient not taking: Reported on 08/18/2022) 21 capsule 4    HYDROmorphone (DILAUDID) 2 MG tablet Take 1 tablet (2 mg total) by mouth every 4 (four) hours as needed for severe pain. 120 tablet 0    loperamide (IMODIUM A-D) 2 MG tablet Take 2 mg by mouth 4 (four) times daily as needed for diarrhea or loose stools. (Patient not taking: Reported on 08/28/2022)      polycarbophil (FIBERCON) 625 MG tablet Take 1 tablet (625 mg total) by mouth 2 (two) times daily as needed for diarrhea or loose stools. (Patient not taking: Reported on 08/28/2022) 30 tablet 0    prochlorperazine (COMPAZINE) 10 MG tablet Take 1 tablet (10 mg total) by mouth every 6 (six) hours as needed for nausea or vomiting. (Patient not taking: Reported on 08/28/2022) 60 tablet 0    TYLENOL 8 HOUR 650 MG CR tablet Take 1,300 mg by mouth every 8 (eight) hours as needed for pain.      Current Facility-Administered Medications  Medication Dose Route Frequency Provider Last Rate Last Admin   ondansetron (ZOFRAN) injection 8 mg  8 mg Intravenous Once Ennever, Rose Phi, MD       Facility-Administered Medications Ordered in Other Encounters  Medication Dose Route Frequency Provider Last Rate Last Admin  atropine 1 MG/ML injection            heparin lock flush 100 unit/mL  500 Units Intravenous Once Ennever, Peter R, MD       sodium cJosph Machohloride flush (NS) 0.9 % injection 10 mL  10 mL Intracatheter Once PRN Erenest Blankarter, Sarah M, NP       sodium chloride flush (NS) 0.9 % injection 10 mL  10 mL Intravenous PRN Josph MachoEnnever, Peter R, MD   10 mL at 10/31/21 1258   ROS  Review of Systems  Constitutional:  Positive for fatigue.  Gastrointestinal:  Positive for abdominal pain.       RLQ ileostomy  Skin:  Positive for color change and rash.  All other systems reviewed and are negative.  Vital signs:  BP 137/85 (BP Location: Right Arm)   Pulse (!) 109   Temp 97.6 F (36.4  C) (Oral)   Resp 18   SpO2 100%  Exam:  Physical Exam Vitals reviewed.  Constitutional:      Appearance: Normal appearance.  Abdominal:     Palpations: Abdomen is soft.  Skin:    General: Skin is warm and dry.     Findings: Erythema present.  Neurological:     Mental Status: She is alert and oriented to person, place, and time.  Psychiatric:        Mood and Affect: Mood normal.        Behavior: Behavior normal.     Stoma type/location:  RLQ ileostomy with retention rod in place.  Causing pouching issues. 1 piece pouch is not keeping seal as it is too small.  We will need to switch to 2 piece pouch with barrier ring   stoma powder and skin prep to dermatitis around stoma Stomal assessment/size:  oval with retention bridge sutured in place Peristomal assessment:  erythema around retention Treatment options for stomal/peristomal skin: stoma powder and skin prep  barrier ring, stretched thin and placed snugly around stoma and retention.  Output: soft brown stool Ostomy pouching: 2pc.  2 3/4" pouch with barrier ring  stoma powder and skin prep as needed to breakdown  Education provided:  pouch change performed.  Added ostomy belt for additional support. Weight of pouch causing abdominal discomfort    Impression/dx  Colostomy complication Contact dermatitis  Discussion  See back 2 weeks Plan  Will set up for supplies.     Visit time: 55 minutes.   Maple HudsonKaren Nathanel Tallman FNP-BC

## 2022-09-04 ENCOUNTER — Other Ambulatory Visit (HOSPITAL_COMMUNITY): Payer: Self-pay

## 2022-09-04 ENCOUNTER — Other Ambulatory Visit: Payer: Self-pay

## 2022-09-04 ENCOUNTER — Emergency Department (HOSPITAL_COMMUNITY): Payer: Medicaid Other

## 2022-09-04 ENCOUNTER — Encounter (HOSPITAL_COMMUNITY): Payer: Self-pay | Admitting: Emergency Medicine

## 2022-09-04 ENCOUNTER — Emergency Department (HOSPITAL_COMMUNITY)
Admission: EM | Admit: 2022-09-04 | Discharge: 2022-09-04 | Disposition: A | Discharge status: Home / Self Care | Payer: Medicaid Other | Attending: Emergency Medicine | Admitting: Emergency Medicine

## 2022-09-04 DIAGNOSIS — Z85038 Personal history of other malignant neoplasm of large intestine: Secondary | ICD-10-CM | POA: Diagnosis not present

## 2022-09-04 DIAGNOSIS — Z91040 Latex allergy status: Secondary | ICD-10-CM | POA: Diagnosis not present

## 2022-09-04 DIAGNOSIS — K8020 Calculus of gallbladder without cholecystitis without obstruction: Secondary | ICD-10-CM | POA: Diagnosis not present

## 2022-09-04 DIAGNOSIS — K9400 Colostomy complication, unspecified: Secondary | ICD-10-CM | POA: Insufficient documentation

## 2022-09-04 DIAGNOSIS — R112 Nausea with vomiting, unspecified: Secondary | ICD-10-CM

## 2022-09-04 DIAGNOSIS — R110 Nausea: Secondary | ICD-10-CM

## 2022-09-04 DIAGNOSIS — C189 Malignant neoplasm of colon, unspecified: Secondary | ICD-10-CM

## 2022-09-04 DIAGNOSIS — L24B3 Irritant contact dermatitis related to fecal or urinary stoma or fistula: Secondary | ICD-10-CM | POA: Insufficient documentation

## 2022-09-04 LAB — COMPREHENSIVE METABOLIC PANEL
ALT: 37 U/L (ref 0–44)
AST: 53 U/L — ABNORMAL HIGH (ref 15–41)
Albumin: 3.4 g/dL — ABNORMAL LOW (ref 3.5–5.0)
Alkaline Phosphatase: 443 U/L — ABNORMAL HIGH (ref 38–126)
Anion gap: 8 (ref 5–15)
BUN: 7 mg/dL (ref 6–20)
CO2: 23 mmol/L (ref 22–32)
Calcium: 8.3 mg/dL — ABNORMAL LOW (ref 8.9–10.3)
Chloride: 105 mmol/L (ref 98–111)
Creatinine, Ser: 0.34 mg/dL — ABNORMAL LOW (ref 0.44–1.00)
GFR, Estimated: >60 mL/min (ref >60)
Glucose, Bld: 92 mg/dL (ref 70–99)
Potassium: 3.7 mmol/L (ref 3.5–5.1)
Sodium: 136 mmol/L (ref 135–145)
Total Bilirubin: 0.7 mg/dL (ref 0.3–1.2)
Total Protein: 7.2 g/dL (ref 6.5–8.1)

## 2022-09-04 LAB — CBC WITH DIFFERENTIAL/PLATELET
Abs Immature Granulocytes: 0 10*3/uL (ref 0.00–0.07)
Basophils Absolute: 0 10*3/uL (ref 0.0–0.1)
Basophils Relative: 1 %
Eosinophils Absolute: 0.1 10*3/uL (ref 0.0–0.5)
Eosinophils Relative: 2 %
HCT: 36.7 % (ref 36.0–46.0)
Hemoglobin: 11.7 g/dL — ABNORMAL LOW (ref 12.0–15.0)
Immature Granulocytes: 0 %
Lymphocytes Relative: 21 %
Lymphs Abs: 0.8 10*3/uL (ref 0.7–4.0)
MCH: 30.2 pg (ref 26.0–34.0)
MCHC: 31.9 g/dL (ref 30.0–36.0)
MCV: 94.6 fL (ref 80.0–100.0)
Monocytes Absolute: 0.5 10*3/uL (ref 0.1–1.0)
Monocytes Relative: 14 %
Neutro Abs: 2.2 10*3/uL (ref 1.7–7.7)
Neutrophils Relative %: 62 %
Platelets: 187 10*3/uL (ref 150–400)
RBC: 3.88 MIL/uL (ref 3.87–5.11)
RDW: 16.8 % — ABNORMAL HIGH (ref 11.5–15.5)
WBC: 3.6 10*3/uL — ABNORMAL LOW (ref 4.0–10.5)
nRBC: 0 % (ref 0.0–0.2)

## 2022-09-04 LAB — LIPASE, BLOOD: Lipase: 21 U/L (ref 11–51)

## 2022-09-04 MED ORDER — HYDROMORPHONE HCL 1 MG/ML IJ SOLN
1.0000 mg | Freq: Once | INTRAMUSCULAR | Status: AC
  Administered 2022-09-04: 1 mg via INTRAVENOUS
  Filled 2022-09-04: qty 1

## 2022-09-04 MED ORDER — LIDOCAINE VISCOUS HCL 2 % MT SOLN
15.0000 mL | Freq: Once | OROMUCOSAL | Status: AC
  Administered 2022-09-04: 15 mL via ORAL
  Filled 2022-09-04: qty 15

## 2022-09-04 MED ORDER — ONDANSETRON HCL 4 MG/2ML IJ SOLN
8.0000 mg | Freq: Once | INTRAMUSCULAR | Status: DC
Start: 2022-09-04 — End: 2022-09-04

## 2022-09-04 MED ORDER — SODIUM CHLORIDE 0.9 % IV BOLUS
1000.0000 mL | Freq: Once | INTRAVENOUS | Status: AC
  Administered 2022-09-04: 1000 mL via INTRAVENOUS

## 2022-09-04 MED ORDER — ALUM & MAG HYDROXIDE-SIMETH 200-200-20 MG/5ML PO SUSP
30.0000 mL | Freq: Once | ORAL | Status: AC
  Administered 2022-09-04: 30 mL via ORAL
  Filled 2022-09-04: qty 30

## 2022-09-04 MED ORDER — ONDANSETRON HCL 4 MG PO TABS
4.0000 mg | ORAL_TABLET | Freq: Four times a day (QID) | ORAL | 0 refills | Status: DC
  Filled 2022-09-04: qty 12, 3d supply, fill #0

## 2022-09-04 MED ORDER — HEPARIN SOD (PORK) LOCK FLUSH 100 UNIT/ML IV SOLN
500.0000 [IU] | Freq: Once | INTRAVENOUS | Status: AC
  Administered 2022-09-04: 500 [IU]
  Filled 2022-09-04: qty 5

## 2022-09-04 MED ORDER — IOHEXOL 300 MG/ML  SOLN
100.0000 mL | Freq: Once | INTRAMUSCULAR | Status: AC | PRN
  Administered 2022-09-04: 100 mL via INTRAVENOUS

## 2022-09-04 MED ORDER — ONDANSETRON 4 MG PO TBDP
4.0000 mg | ORAL_TABLET | Freq: Once | ORAL | Status: AC
  Administered 2022-09-04: 4 mg via ORAL
  Filled 2022-09-04: qty 1

## 2022-09-04 NOTE — ED Provider Triage Note (Signed)
Emergency Medicine Provider Triage Evaluation Note  Rebecca Bullock , a 57 y.o. female  was evaluated in triage.  Pt complains of nausea and vomiting for the past 2 days.  Had a colostomy bag placed 2 weeks ago and states she has been able to tolerate foods and fluids until 2 days ago when she is only been able to tolerate popsicles and small amounts of fluid but no solid foods.  Patient denied any hematemesis or blood in her colostomy bag.  Patient stated she talked with her surgeon who told her to come to the ER to be checked out.  Patient states she has an antiemetic at home does not know the name of it and has not taken it in a day due to it not helping.  Patient denied chest pain, shortness of breath, fatigue, change in sensation/motor skills, vision changes   Review of Systems  Positive: See HPI Negative: See HPI  Physical Exam  BP (!) 135/90   Pulse (!) 106   Temp 98.1 F (36.7 C) (Oral)   Resp 17   Ht 5\' 1"  (1.549 m)   Wt 77.6 kg   SpO2 100%   BMI 32.32 kg/m  Gen:   Awake, no distress   Resp:  Normal effort  MSK:   Moves extremities without difficulty  Other:  Abdomen nontender to palpation, no peritoneal signs noted, no murmurs auscultated, lungs clear to auscultation bilaterally  Medical Decision Making  Medically screening exam initiated at 3:41 PM.  Appropriate orders placed.  Sirine Gloria was informed that the remainder of the evaluation will be completed by another provider, this initial triage assessment does not replace that evaluation, and the importance of remaining in the ED until their evaluation is complete.  Workup initiated, patient given Zofran for symptom management and stable at this time   Remi Deter 09/04/22 1544

## 2022-09-04 NOTE — Telephone Encounter (Signed)
Patient successfully OnBoarded and drug education provided by pharmacist. Patient scheduled to pick up Rebecca Bullock from [facility] on 09/05/22 to start taking on 09/10/22. Patient knows to call me at [phone] with any questions or concerns regarding receiving medication or an unexpected change in co-pay.    Ardeen Fillers, CPhT Oncology Pharmacy Patient Advocate  [facility]  [phone] (phone) [phone] (fax) 09/04/2022 2:09 PM

## 2022-09-04 NOTE — ED Provider Notes (Signed)
[place] [facility] AT [facility] Provider Note   CSN: [id number] Arrival date & time: 09/04/22  1501     History  Chief Complaint  Patient presents with   Emesis    Rebecca Bullock is a 57 y.o. female history of colon cancer with mets and status post colostomy presented with nausea and vomiting for the past 2 days.  Had a colostomy bag placed 2 weeks ago and states she has been able to tolerate foods and fluids until 2 days ago when she is only been able to tolerate popsicles and small amounts of fluid but no solid foods.  Patient denied any hematemesis or blood in her colostomy bag.  Patient stated she talked with her surgeon who told her to come to the ER to be checked out.  Patient states she has an antiemetic at home does not know the name of it and has not taken it in a day due to it not helping.  Patient is currently not receiving chemo or radiation but is on a kinase inhibitor for her cancer.  Patient denied chest pain, shortness of breath, fatigue, change in sensation/motor skills, vision changes  Home Medications Prior to Admission medications   Medication Sig Start Date End Date Taking? Authorizing Provider  ondansetron (ZOFRAN) 4 MG tablet Take 1 tablet (4 mg total) by mouth every 6 (six) hours. 09/04/22  Yes Telisha Zawadzki, Beverly Gust, PA-C  baclofen (LIORESAL) 10 MG tablet Take 1 tablet (10 mg total) by mouth 3 (three) times daily. 08/28/22   Josph Macho, MD  cyclobenzaprine (FLEXERIL) 10 MG tablet Take 1 tablet (10 mg total) by mouth 3 (three) times daily as needed for muscle spasms. Patient taking differently: Take 10 mg by mouth See admin instructions. Take 10 mg by mouth in the morning and at bedtime- and an additional 10 mg once a day as needed for muscle spasms 07/04/22   Josph Macho, MD  famotidine (PEPCID) 40 MG tablet Take 1 tablet (40 mg total) by mouth 2 (two) times daily. Patient not taking: Reported on 08/28/2022 08/07/22   Josph Macho, MD   Ferrous Sulfate (IRON) 325 (65 Fe) MG TABS Take 1 tablet by mouth 2 times daily as needed (loose stool/high ileostomy output). Patient not taking: Reported on 08/28/2022 08/23/22   Juliet Rude, PA-C  fruquintinib (FRUZAQLA) 5 MG capsule Take 1 capsule (5 mg total) by mouth daily. Take for 21 days on, then off for 7 days. Repeat every 28 days. Patient not taking: Reported on 08/18/2022 08/13/22   Josph Macho, MD  HYDROmorphone (DILAUDID) 2 MG tablet Take 1 tablet (2 mg total) by mouth every 4 (four) hours as needed for severe pain. 08/23/22   Josph Macho, MD  loperamide (IMODIUM A-D) 2 MG tablet Take 2 mg by mouth 4 (four) times daily as needed for diarrhea or loose stools. Patient not taking: Reported on 08/28/2022    [provider]  polycarbophil (FIBERCON) 625 MG tablet Take 1 tablet (625 mg total) by mouth 2 (two) times daily as needed for diarrhea or loose stools. Patient not taking: Reported on 08/28/2022 08/23/22 09/22/22  Juliet Rude, PA-C  prochlorperazine (COMPAZINE) 10 MG tablet Take 1 tablet (10 mg total) by mouth every 6 (six) hours as needed for nausea or vomiting. Patient not taking: Reported on 08/28/2022 07/11/22   Josph Macho, MD  TYLENOL 8 HOUR 650 MG CR tablet Take 1,300 mg by mouth every 8 (eight)  hours as needed for pain.    [provider]      Allergies    Augmentin [amoxicillin-pot clavulanate], Irinotecan, Metronidazole, Vancomycin, Latex, Tape, and Wound dressing adhesive    Review of Systems   Review of Systems  Gastrointestinal:  Positive for vomiting.  See HPI  Physical Exam Updated Vital Signs BP (!) 145/90   Pulse 93   Temp 98.6 F (37 C) (Oral)   Resp 20   Ht  (1.549 m)   Wt 77.6 kg   SpO2 96%   BMI 32.32 kg/m  Physical Exam Vitals reviewed.  Constitutional:      General: She is not in acute distress. HENT:     Head: Normocephalic and atraumatic.  Eyes:     Extraocular Movements: Extraocular movements intact.      Conjunctiva/sclera: Conjunctivae normal.     Pupils: Pupils are equal, round, and reactive to light.  Cardiovascular:     Rate and Rhythm: Normal rate and regular rhythm.     Pulses: Normal pulses.     Heart sounds: Normal heart sounds.     Comments: 2+ bilateral radial/dorsalis pedis pulses with regular rate Pulmonary:     Effort: Pulmonary effort is normal. No respiratory distress.     Breath sounds: Normal breath sounds.  Abdominal:     Palpations: Abdomen is soft.     Tenderness: There is abdominal tenderness (Generalized). There is no guarding or rebound.     Comments: Ostomy bag present  Musculoskeletal:        General: Normal range of motion.     Cervical back: Normal range of motion and neck supple.     Comments: 5 out of 5 bilateral grip/leg extension strength  Skin:    General: Skin is warm and dry.     Capillary Refill: Capillary refill takes less than 2 seconds.  Neurological:     General: No focal deficit present.     Mental Status: She is alert and oriented to person, place, and time.     Comments: Sensation intact in all 4 limbs  Psychiatric:        Mood and Affect: Mood normal.     ED Results / Procedures / Treatments   Labs (all labs ordered are listed, but only abnormal results are displayed) Labs Reviewed  CBC WITH DIFFERENTIAL/PLATELET - Abnormal; Notable for the following components:      Result Value   WBC 3.6 (*)    Hemoglobin 11.7 (*)    RDW 16.8 (*)    All other components within normal limits  COMPREHENSIVE METABOLIC PANEL - Abnormal; Notable for the following components:   Creatinine, Ser 0.34 (*)    Calcium 8.3 (*)    Albumin 3.4 (*)    AST 53 (*)    Alkaline Phosphatase 443 (*)    All other components within normal limits  LIPASE, BLOOD  URINALYSIS, ROUTINE W REFLEX MICROSCOPIC    EKG EKG Interpretation  Date/Time:  Tuesday September 04 2022 17:33:09 EDT Ventricular Rate:  97 PR Interval:  133 QRS Duration: 81 QT  Interval:  350 QTC Calculation: 445 R Axis:   131 Text Interpretation: Right and left arm electrode reversal, interpretation assumes no reversal Sinus rhythm Right axis deviation Abnormal T, consider ischemia, lateral leads No significant change since last tracing Confirmed by Richardean Canal [phone]) on 09/04/2022 7:32:12 PM  Radiology CT Abdomen Pelvis W Contrast  Result Date: 09/04/2022 CLINICAL DATA:  Nausea vomiting EXAM: CT ABDOMEN  AND PELVIS WITH CONTRAST TECHNIQUE: Multidetector CT imaging of the abdomen and pelvis was performed using the standard protocol following bolus administration of intravenous contrast. RADIATION DOSE REDUCTION: This exam was performed according to the departmental dose-optimization program which includes automated exposure control, adjustment of the mA and/or kV according to patient size and/or use of iterative reconstruction technique. CONTRAST:  OMNIPAQUE IOHEXOL 300 MG/ML  SOLN COMPARISON:  CT 08/19/2022, 06/28/2022 FINDINGS: Lower chest: Lung bases demonstrate no acute airspace disease. Atelectasis at the lingula and lung bases. Normal cardiac size. Hepatobiliary: Large posterior right hepatic lobe mass. This measures approximately 10.1 x 7.7 cm on series 2, image 18, previously 10 x 7.5 cm. Slightly more inferior hypodense liver mass, series 2, image 24 measured as separate lesion on the March exam is probably contiguous with the dominant adjacent lesion. Subcentimeter hypodensity in the left hepatic lobe on series 2, image 24 measures 6 mm, previously 8 mm. 1.8 cm cyst in the inferior right hepatic lobe without significant change. Similar probable cyst adjacent to falciform ligament on series 2, image 24. Multiple gallstones. Possible mild gallbladder wall thickening. Mild biliary dilatation in the right hepatic lobe. Pancreas: Unremarkable. No pancreatic ductal dilatation or surrounding inflammatory changes. Spleen: 1.5 cm hypodense splenic lesion without significant  change. Adrenals/Urinary Tract: Adrenal glands are normal. Kidneys show no hydronephrosis. The bladder is unremarkable Stomach/Bowel: Stomach is nonenlarged. No dilated small bowel. Interval right abdominal loop ileostomy. No distended small bowel on today's study. Minimal stranding within the abdominal wall at the site of ileostomy likely resolving postsurgical change. Circumferential ascending colon mass measuring 3 cm in craniocaudal length on coronal views. Colon is decompressed Vascular/Lymphatic: Nonaneurysmal aorta. Enlarged portal caval node measuring 19 mm, previously 19 mm. Right para colic lymph nodes measuring up to 8 mm, previously 8 mm. Distal aortocaval lymph node measuring 11 mm, previously 12 mm, series 2, image 46. Reproductive: Uterus and bilateral adnexa are unremarkable. Other: Negative for pelvic effusion or free air. Musculoskeletal: Mild sclerotic and lucent lesion in the L1 vertebral body. Possible superior endplate fracture at L1. IMPRESSION: 1. Interval right abdominal loop ileostomy. No evidence for small bowel obstruction or acute bowel wall thickening on this examination. Redemonstrated circumferential ascending colon mass. 2. Grossly stable size of dominant right hepatic lobe mass compared with most recent prior CT. Adjacent satellite lesion now appears to be contiguous with the dominant right hepatic lobe lesion. 3. Similar enlarged portal caval, aortocaval and paracolic nodes. 4. Mild sclerotic lesion L1 vertebral body with possible superior endplate fracture. 5. Multiple gallstones. Possible mild gallbladder wall thickening. 6. Stable 15 mm hypodense splenic lesion. Electronically Signed   By: Jasmine Pang M.D.   On: 09/04/2022 19:30    Procedures Procedures    Medications Ordered in ED Medications  heparin lock flush 100 unit/mL (has no administration in time range)  ondansetron (ZOFRAN-ODT) disintegrating tablet 4 mg (4 mg Oral Given 09/04/22 1750)  sodium chloride 0.9 %  bolus 1,000 mL (0 mLs Intravenous Stopped 09/04/22 1858)  alum & mag hydroxide-simeth (MAALOX/MYLANTA) 200-200-20 MG/5ML suspension 30 mL (30 mLs Oral Given 09/04/22 1900)    And  lidocaine (XYLOCAINE) 2 % viscous mouth solution 15 mL (15 mLs Oral Given 09/04/22 1856)  HYDROmorphone (DILAUDID) injection 1 mg (1 mg Intravenous Given 09/04/22 1855)  iohexol (OMNIPAQUE) 300 MG/ML solution 100 mL (100 mLs Intravenous Contrast Given 09/04/22 1833)    ED Course/ Medical Decision Making/ A&P  Medical Decision Making Amount and/or Complexity of Data Reviewed Labs: ordered. Radiology: ordered.  Risk OTC drugs. Prescription drug management.   Rebecca Fredricksonammy Haldeman 57 y.o. presented today for nausea/vomiting. Working DDx that I considered at this time includes, but not limited to, gastroenteritis, colitis, small bowel obstruction, pancreatitis, obstruction secondary to mets, cholecystitis.  R/o DDx: gastroenteritis, colitis, small bowel obstruction, pancreatitis, obstruction secondary to mets, cholecystitis: These are considered less likely due to history of present illness and physical exam findings.  Review of prior external notes: 08/30/22 Progress Notes  Unique Tests and My Interpretation:  CBC with differential: Unremarkable CMP: Alk phos elevated 443 Lipase: Unremarkable UA: Unable to obtain EKG: Rate, rhythm, axis, intervals all examined and without medically relevant abnormality. ST segments without concerns for elevations CT Abd/Pelvis with contrast: Multiple gallstones with possible mild gall bladder wall thickening  Discussion with Independent Historian: None  Discussion of Management of Tests: None  Risk: Low: based on diagnostic testing/clinical impression and treatment plan  Risk Stratification Score: None  Staffed with Silverio LayYao, MD   Plan: Patient presented for nausea/vomiting. On exam patient was no acute distress and had stable vitals however was tachycardic at  106 however suspect this is secondary to the emesis.  Patient does not have an emesis since being in the ED thus far and had overall unremarkable physical exam.  Patient has stool in her ostomy bag with no blood noted.  Labs and imaging were ordered along with Zofran and fluids for symptom management.  Patient stated her acid reflux was began to act up and that she did not have her antacids and so a GI cocktail was ordered for patient's symptoms.  Patient was also given Dilaudid for her back pain and she is on Dilaudid at home.  Patient stable at this time.  On recheck patient stated she was asymptomatic and waiting CT read.  CT showed multiple gallstones that could possibly explain patient's symptoms.  Patient was able to tolerate fluids orally and will be discharged.  I spoke to the patient about these findings and that she would be free to a general surgeon for further evaluation.  All of patient's questions were answered to her satisfaction.  Patient Zofran will be refilled for symptom management and I encouraged patient to continue taking her pain meds as prescribed.  I encouraged the patient to continue taking food and fluids as tolerated.  Patient was given return precautions.patient stable for discharge at this time.  Patient verbalized understanding of plan.         Final Clinical Impression(s) / ED Diagnoses Final diagnoses:  Nausea and vomiting, unspecified vomiting type  Gallstones    Rx / DC Orders ED Discharge Orders          Ordered    ONDANSETRON HCL 4 MG/2ML IJ SOLN   Once,   Status:  Discontinued        09/04/22 2002    ondansetron (ZOFRAN) 4 MG tablet  Every 6 hours        09/04/22 2019              Netta CorriganSchuman, Leiann Sporer T, PA-C 09/04/22 2019    Charlynne PanderYao, David Hsienta, MD 09/04/22 [phone]

## 2022-09-04 NOTE — ED Notes (Signed)
Port deaccessed. No complications.

## 2022-09-04 NOTE — Discharge Instructions (Addendum)
Please follow-up with a general surgeon I have attached here for your gallbladder stones.  I have also refilled your Zofran as well for the nausea.  Please continue to take in food and fluids as tolerated and follow-up with a general surgeon.  You may continue to take your pain medication as prescribed however if symptoms begin to worsen please return to ER.

## 2022-09-04 NOTE — Telephone Encounter (Signed)
Oral Chemotherapy Pharmacist Encounter  Rebecca Bullock Start Date: 09/10/2022  Patient Education I spoke with patient for overview of new oral chemotherapy medication: Rebecca Bullock (fruquintinib) for the treatment of metastatic colorectal cancer, planned duration until disease progression or unacceptable drug toxicity.   Pt is doing well. Counseled patient on administration, dosing, side effects, monitoring, drug-food interactions, safe handling, storage, and disposal.  Patient will take Fruzaqla 5 mg capsule, 1 capsule (5 mg total) by mouth daily. Patient will take this for 21 days on, 7 days off, repeated every 28 days.  Side effects include but not limited to: hypertension, hand foot syndrome, changes in electrolytes, diarrhea, fatigue, hepatotoxicity, rare risk of increased bleed risk also discussed.      Patient aware that medication would need to be held 2 weeks before and minimum 2 weeks after surgery due to wound healing/bleed risk of medication.   Reviewed with patient importance of keeping a medication schedule and plan for any missed doses.  After discussion with patient no patient barriers to medication adherence identified.   Rebecca Bullock voiced understanding and appreciation. All questions answered. Medication handout provided.  Provided patient with Oral Chemotherapy Navigation Clinic phone number. Patient knows to call the office with questions or concerns.  Lenord Carbo, PharmD, BCPS, BCOP Hematology/Oncology Clinical Pharmacist [facility] Oral Chemotherapy Navigation Clinics [phone] 09/04/2022 2:00 PM

## 2022-09-04 NOTE — ED Notes (Signed)
Patient tolerating water.

## 2022-09-04 NOTE — ED Triage Notes (Signed)
Patient c/o N/V x3 days.. Pt report little output on her colostomy bag. Family medicine recommended to come to ED for possible SBO pt hx of Stage 4 Colon Ca. Colostomy place 2 weeks ago,

## 2022-09-05 ENCOUNTER — Other Ambulatory Visit (HOSPITAL_BASED_OUTPATIENT_CLINIC_OR_DEPARTMENT_OTHER): Payer: Self-pay

## 2022-09-06 ENCOUNTER — Telehealth: Payer: Self-pay | Admitting: *Deleted

## 2022-09-06 ENCOUNTER — Other Ambulatory Visit: Payer: Self-pay | Admitting: *Deleted

## 2022-09-06 DIAGNOSIS — C787 Secondary malignant neoplasm of liver and intrahepatic bile duct: Secondary | ICD-10-CM

## 2022-09-06 MED ORDER — HYDROMORPHONE HCL 4 MG PO TABS: 4.0000 mg | ORAL_TABLET | ORAL | 0 refills | Status: DC | PRN

## 2022-09-06 NOTE — Telephone Encounter (Signed)
Call received from patient stating that she was in the ER on Tuesday and the ER MD's recommend that she have her gall bladder removed.  She would like to know if Dr Myna Hidalgo recommends that she have her gall bladder removed.  Dr. Myna Hidalgo notified. Call placed back to patient and patient notified that Dr. Myna Hidalgo does suggest that she contact Dr. Dwain Sarna to discuss removal of gall bladder.  Rebecca Bullock states that she will call Dr. Doreen Salvage office now and is appreciative of call back.

## 2022-09-06 NOTE — Telephone Encounter (Signed)
Call placed to patient to instruct her to increase Dilaudid 2 mg PO every 4 hrs as needed PRN to Dilaudid 4 mg PO every 2 hrs as needed PRN for pain.  On earlier phone call today, pt stated that Dilaudid 2 mg is lasting aprox 2 hours and then pain returns.  She also stated that she is not able to sleep at night d/t increased pain.  Teach back done with increase in Dilaudid prescription dose and time.  Pt is appreciative of call back and assistance and has no questions at this time. No refill of Dilaudid sent at this time due to pt states that she has plenty of Dilaudid.

## 2022-09-10 ENCOUNTER — Other Ambulatory Visit: Payer: Self-pay

## 2022-09-10 ENCOUNTER — Other Ambulatory Visit (HOSPITAL_BASED_OUTPATIENT_CLINIC_OR_DEPARTMENT_OTHER): Payer: Self-pay

## 2022-09-10 ENCOUNTER — Telehealth: Payer: Self-pay | Admitting: *Deleted

## 2022-09-10 ENCOUNTER — Other Ambulatory Visit: Payer: Self-pay | Admitting: *Deleted

## 2022-09-10 DIAGNOSIS — C182 Malignant neoplasm of ascending colon: Secondary | ICD-10-CM

## 2022-09-10 DIAGNOSIS — C189 Malignant neoplasm of colon, unspecified: Secondary | ICD-10-CM

## 2022-09-10 MED ORDER — SCOPOLAMINE 1 MG/3DAYS TD PT72
MEDICATED_PATCH | TRANSDERMAL | 3 refills | Status: DC
  Filled 2022-09-10: qty 10, 30d supply, fill #0
  Filled 2022-10-08: qty 10, 30d supply, fill #1
  Filled 2022-11-24: qty 10, 30d supply, fill #2

## 2022-09-10 NOTE — Telephone Encounter (Signed)
Returned patient's phone call regarding several questions. Per Dr. Myna Hidalgo, do not start the oral chemotherapy drug Fruzaqla since, you need to have your gallbladder removed and colostomy reversed. Dr. Myna Hidalgo has sent in a prescription for Scopolamine patches for the nausea and vomiting. If Dr. Dwain Sarna is out of network, please call your insurance company and ask who is network to do surgery. Please call the office with the names of surgeons and then we can ask Dr. Myna Hidalgo who he prefers. She verbalized understanding.

## 2022-09-11 ENCOUNTER — Ambulatory Visit (HOSPITAL_COMMUNITY)
Admission: RE | Admit: 2022-09-11 | Discharge: 2022-09-11 | Disposition: A | Discharge status: Home / Self Care | Payer: Medicaid Other | Source: Ambulatory Visit | Attending: Nurse Practitioner | Admitting: Nurse Practitioner

## 2022-09-11 ENCOUNTER — Other Ambulatory Visit: Payer: Self-pay

## 2022-09-11 ENCOUNTER — Emergency Department (HOSPITAL_BASED_OUTPATIENT_CLINIC_OR_DEPARTMENT_OTHER)
Admission: EM | Admit: 2022-09-11 | Discharge: 2022-09-11 | Disposition: A | Discharge status: Home / Self Care | Payer: Medicaid Other | Attending: Emergency Medicine | Admitting: Emergency Medicine

## 2022-09-11 ENCOUNTER — Emergency Department (HOSPITAL_BASED_OUTPATIENT_CLINIC_OR_DEPARTMENT_OTHER): Payer: Medicaid Other

## 2022-09-11 ENCOUNTER — Other Ambulatory Visit (HOSPITAL_COMMUNITY): Payer: Self-pay

## 2022-09-11 ENCOUNTER — Encounter (HOSPITAL_BASED_OUTPATIENT_CLINIC_OR_DEPARTMENT_OTHER): Payer: Self-pay

## 2022-09-11 ENCOUNTER — Other Ambulatory Visit (HOSPITAL_BASED_OUTPATIENT_CLINIC_OR_DEPARTMENT_OTHER): Payer: Self-pay

## 2022-09-11 DIAGNOSIS — R109 Unspecified abdominal pain: Secondary | ICD-10-CM | POA: Insufficient documentation

## 2022-09-11 DIAGNOSIS — Z91040 Latex allergy status: Secondary | ICD-10-CM | POA: Insufficient documentation

## 2022-09-11 DIAGNOSIS — Z85038 Personal history of other malignant neoplasm of large intestine: Secondary | ICD-10-CM | POA: Insufficient documentation

## 2022-09-11 DIAGNOSIS — L24B3 Irritant contact dermatitis related to fecal or urinary stoma or fistula: Secondary | ICD-10-CM | POA: Diagnosis not present

## 2022-09-11 DIAGNOSIS — E860 Dehydration: Secondary | ICD-10-CM | POA: Insufficient documentation

## 2022-09-11 DIAGNOSIS — C189 Malignant neoplasm of colon, unspecified: Secondary | ICD-10-CM | POA: Diagnosis not present

## 2022-09-11 DIAGNOSIS — K8050 Calculus of bile duct without cholangitis or cholecystitis without obstruction: Secondary | ICD-10-CM | POA: Insufficient documentation

## 2022-09-11 DIAGNOSIS — R1114 Bilious vomiting: Secondary | ICD-10-CM

## 2022-09-11 DIAGNOSIS — K9413 Enterostomy malfunction: Secondary | ICD-10-CM

## 2022-09-11 DIAGNOSIS — Z932 Ileostomy status: Secondary | ICD-10-CM | POA: Diagnosis present

## 2022-09-11 DIAGNOSIS — K8080 Other cholelithiasis without obstruction: Secondary | ICD-10-CM

## 2022-09-11 DIAGNOSIS — R112 Nausea with vomiting, unspecified: Secondary | ICD-10-CM | POA: Diagnosis present

## 2022-09-11 LAB — CBC WITH DIFFERENTIAL/PLATELET
Abs Immature Granulocytes: 0.02 10*3/uL (ref 0.00–0.07)
Basophils Absolute: 0 10*3/uL (ref 0.0–0.1)
Basophils Relative: 0 %
Eosinophils Absolute: 0 10*3/uL (ref 0.0–0.5)
Eosinophils Relative: 1 %
HCT: 37 % (ref 36.0–46.0)
Hemoglobin: 12 g/dL (ref 12.0–15.0)
Immature Granulocytes: 0 %
Lymphocytes Relative: 11 %
Lymphs Abs: 0.5 10*3/uL — ABNORMAL LOW (ref 0.7–4.0)
MCH: 29.9 pg (ref 26.0–34.0)
MCHC: 32.4 g/dL (ref 30.0–36.0)
MCV: 92.3 fL (ref 80.0–100.0)
Monocytes Absolute: 0.6 10*3/uL (ref 0.1–1.0)
Monocytes Relative: 12 %
Neutro Abs: 3.5 10*3/uL (ref 1.7–7.7)
Neutrophils Relative %: 76 %
Platelets: 188 10*3/uL (ref 150–400)
RBC: 4.01 MIL/uL (ref 3.87–5.11)
RDW: 15.9 % — ABNORMAL HIGH (ref 11.5–15.5)
WBC: 4.7 10*3/uL (ref 4.0–10.5)
nRBC: 0 % (ref 0.0–0.2)

## 2022-09-11 LAB — COMPREHENSIVE METABOLIC PANEL
ALT: 34 U/L (ref 0–44)
AST: 60 U/L — ABNORMAL HIGH (ref 15–41)
Albumin: 3.3 g/dL — ABNORMAL LOW (ref 3.5–5.0)
Alkaline Phosphatase: 501 U/L — ABNORMAL HIGH (ref 38–126)
Anion gap: 11 (ref 5–15)
BUN: 8 mg/dL (ref 6–20)
CO2: 27 mmol/L (ref 22–32)
Calcium: 8.8 mg/dL — ABNORMAL LOW (ref 8.9–10.3)
Chloride: 96 mmol/L — ABNORMAL LOW (ref 98–111)
Creatinine, Ser: 0.57 mg/dL (ref 0.44–1.00)
GFR, Estimated: >60 mL/min (ref >60)
Glucose, Bld: 113 mg/dL — ABNORMAL HIGH (ref 70–99)
Potassium: 3.6 mmol/L (ref 3.5–5.1)
Sodium: 134 mmol/L — ABNORMAL LOW (ref 135–145)
Total Bilirubin: 0.9 mg/dL (ref 0.3–1.2)
Total Protein: 7.8 g/dL (ref 6.5–8.1)

## 2022-09-11 LAB — LIPASE, BLOOD: Lipase: 20 U/L (ref 11–51)

## 2022-09-11 MED ORDER — LACTATED RINGERS IV BOLUS
1000.0000 mL | Freq: Once | INTRAVENOUS | Status: AC
  Administered 2022-09-11: 1000 mL via INTRAVENOUS

## 2022-09-11 MED ORDER — HEPARIN SOD (PORK) LOCK FLUSH 100 UNIT/ML IV SOLN
INTRAVENOUS | Status: AC
  Filled 2022-09-11: qty 5

## 2022-09-11 MED ORDER — HYDROMORPHONE HCL 1 MG/ML IJ SOLN
1.0000 mg | Freq: Once | INTRAMUSCULAR | Status: AC
  Administered 2022-09-11: 1 mg via INTRAVENOUS
  Filled 2022-09-11: qty 1

## 2022-09-11 MED ORDER — ONDANSETRON HCL 4 MG/2ML IJ SOLN
4.0000 mg | Freq: Once | INTRAMUSCULAR | Status: AC
  Administered 2022-09-11: 4 mg via INTRAVENOUS
  Filled 2022-09-11: qty 2

## 2022-09-11 MED ORDER — METOCLOPRAMIDE HCL 10 MG PO TABS
10.0000 mg | ORAL_TABLET | Freq: Four times a day (QID) | ORAL | 0 refills | Status: DC
  Filled 2022-09-11: qty 30, 8d supply, fill #0

## 2022-09-11 NOTE — Discharge Instructions (Addendum)
You were seen in the emergency department for nausea, vomiting, and abdominal pain. Given that this is most likely due to the metastatic cancer and liver mass, we decided to try for home management of your nausea and pain until you are able to follow up with Dr. Dwain Sarna. If you feel that your symptoms are worsening, please return to the emergency department for further evaluation.

## 2022-09-11 NOTE — Discharge Instructions (Signed)
Call [facility] to ask about supplies  Prescription was sent 08/31/22. Phone  [phone]  [email or web address] for questions/concerns

## 2022-09-11 NOTE — ED Provider Notes (Signed)
[facility] Provider Note   CSN: [id number] Arrival date & time: 09/11/22  1333     History Chief Complaint  Patient presents with   Nausea   Emesis   Dehydration         Rebecca Bullock is a 57 y.o. female.  Patient with past history significant for metastatic colon cancer with metastases in the liver, hiatal hernia, AKI presents emergency department complaints of nausea, vomiting, flank pain.  She reports that she was here in the emergency department about a week ago for similar symptoms and was advised at that time denies that she had gallstones.  Given that she had symptomatic improvement and resolution here in the emergency department with antiemetics and pain medications, patient was discharged and advised to follow-up with general surgery if symptoms were to return for elective cholecystectomy.  Patient daughter reports that she has not been having consistent vomiting for the last 1 and half weeks and has reached out to her primary care provider with no help in finding a specific general surgeon that is within her network to have her gallbladder taken out.  She reports that her PCP wrote a prescription for scopolamine patches that she can pick up this afternoon but has not been able to use these yet.  Patient is taking Dilaudid tablets at home and had a recent increase from 2 mg to 4 mg every 2 hours if needed.  She reports she is unable to tolerate any significant oral intake and is only able to keep down popsicles.  Does report some associated diarrhea but this is most likely due to no solid food intake.  Denies any episodes of hematemesis or hematochezia.    Emesis Associated symptoms: abdominal pain        Home Medications Prior to Admission medications   Medication Sig Start Date End Date Taking? Authorizing Provider  metoCLOPramide (REGLAN) 10 MG tablet Take 1 tablet (10 mg total) by mouth every 6 (six) hours. 09/11/22  Yes Smitty Knudsen, PA-C  scopolamine (TRANSDERM-SCOP) 1 MG/3DAYS Place one patch behind the ear every three days. 09/10/22   Josph Macho, MD  baclofen (LIORESAL) 10 MG tablet Take 1 tablet (10 mg total) by mouth 3 (three) times daily. 08/28/22   Josph Macho, MD  cyclobenzaprine (FLEXERIL) 10 MG tablet Take 1 tablet (10 mg total) by mouth 3 (three) times daily as needed for muscle spasms. Patient taking differently: Take 10 mg by mouth See admin instructions. Take 10 mg by mouth in the morning and at bedtime- and an additional 10 mg once a day as needed for muscle spasms 07/04/22   Josph Macho, MD  famotidine (PEPCID) 40 MG tablet Take 1 tablet (40 mg total) by mouth 2 (two) times daily. Patient not taking: Reported on 08/28/2022 08/07/22   Josph Macho, MD  Ferrous Sulfate (IRON) 325 (65 Fe) MG TABS Take 1 tablet by mouth 2 times daily as needed (loose stool/high ileostomy output). Patient not taking: Reported on 08/28/2022 08/23/22   Juliet Rude, PA-C  fruquintinib (FRUZAQLA) 5 MG capsule Take 1 capsule (5 mg total) by mouth daily. Take for 21 days on, then off for 7 days. Repeat every 28 days. Patient not taking: Reported on 08/18/2022 08/13/22   Josph Macho, MD  HYDROmorphone (DILAUDID) 2 MG tablet Take 1 tablet (2 mg total) by mouth every 4 (four) hours as needed for severe pain. 08/23/22   Arlan Organ  R, MD  HYDROmorphone (DILAUDID) 4 MG tablet Take 1 tablet (4 mg total) by mouth every 2 (two) hours as needed for severe pain. 09/06/22   Josph Macho, MD  loperamide (IMODIUM A-D) 2 MG tablet Take 2 mg by mouth 4 (four) times daily as needed for diarrhea or loose stools. Patient not taking: Reported on 08/28/2022    [provider]  ondansetron (ZOFRAN) 4 MG tablet Take 1 tablet (4 mg total) by mouth every 6 (six) hours. 09/04/22   Netta Corrigan, PA-C  polycarbophil (FIBERCON) 625 MG tablet Take 1 tablet (625 mg total) by mouth 2 (two) times daily as needed for diarrhea or loose  stools. Patient not taking: Reported on 08/28/2022 08/23/22 09/22/22  Juliet Rude, PA-C  prochlorperazine (COMPAZINE) 10 MG tablet Take 1 tablet (10 mg total) by mouth every 6 (six) hours as needed for nausea or vomiting. Patient not taking: Reported on 08/28/2022 07/11/22   Josph Macho, MD  TYLENOL 8 HOUR 650 MG CR tablet Take 1,300 mg by mouth every 8 (eight) hours as needed for pain.    [provider]      Allergies    Augmentin [amoxicillin-pot clavulanate], Irinotecan, Metronidazole, Vancomycin, Latex, Tape, and Wound dressing adhesive    Review of Systems   Review of Systems  Gastrointestinal:  Positive for abdominal pain and vomiting.  All other systems reviewed and are negative.   Physical Exam Updated Vital Signs BP (!) 142/78   Pulse 79   Temp 97.9 F (36.6 C) (Oral)   Resp 14   SpO2 96%  Physical Exam Vitals and nursing note reviewed.  Constitutional:      General: She is not in acute distress.    Appearance: She is well-developed.  HENT:     Head: Normocephalic and atraumatic.  Eyes:     Conjunctiva/sclera: Conjunctivae normal.  Cardiovascular:     Rate and Rhythm: Regular rhythm. Tachycardia present.     Heart sounds: No murmur heard. Pulmonary:     Effort: Pulmonary effort is normal. No respiratory distress.     Breath sounds: Normal breath sounds.  Abdominal:     General: There is no distension.     Palpations: Abdomen is soft. There is no mass.     Tenderness: There is abdominal tenderness.     Comments: Right sided abdominal tenderness with radiation towards right sided lower back.  Musculoskeletal:        General: No swelling.     Cervical back: Neck supple.  Skin:    General: Skin is warm and dry.     Capillary Refill: Capillary refill takes less than 2 seconds.  Neurological:     Mental Status: She is alert.  Psychiatric:        Mood and Affect: Mood normal.     ED Results / Procedures / Treatments   Labs (all labs ordered  are listed, but only abnormal results are displayed) Labs Reviewed  COMPREHENSIVE METABOLIC PANEL - Abnormal; Notable for the following components:      Result Value   Sodium 134 (*)    Chloride 96 (*)    Glucose, Bld 113 (*)    Calcium 8.8 (*)    Albumin 3.3 (*)    AST 60 (*)    Alkaline Phosphatase 501 (*)    All other components within normal limits  CBC WITH DIFFERENTIAL/PLATELET - Abnormal; Notable for the following components:   RDW 15.9 (*)    Lymphs  Abs 0.5 (*)    All other components within normal limits  LIPASE, BLOOD  URINALYSIS, ROUTINE W REFLEX MICROSCOPIC    EKG None  Radiology US Abdomen Limited RUQ (LIVER/GB)  Result Date: 09/11/2022 CLINICAL DATA:  Nausea vomiting and back pain EXAM: ULTRASOUND ABDOMEN LIMITED RIGHT UPPER QUADRANT COMPARISON:  CT abdomen and pelvis dated September 04, 2022 FINDINGS: Gallbladder: Gallbladder wall thickening. Gallstones, largest measures 2.1 cm. No sonographic Murphy sign noted by sonographer. Common bile duct: Diameter: 4 mm Intrahepatic biliary ductal dilation. Liver: Mass of the right lobe of the liver measuring 9.5 x 13.1 x 11.8 cm. Increased parenchymal echogenicity. Portal vein is patent on color Doppler imaging with normal direction of blood flow towards the liver. Other: None. IMPRESSION: 1. Cholelithiasis with gallbladder wall thickening, findings can be seen in the setting of acute cholecystitis although sonographic Murphy sign is negative. Recommend clinical correlation. 2. Large mass of the right lobe of the liver. 3. Intrahepatic biliary ductal dilation, likely secondary to compression from large hepatic mass. 4. Hepatic steatosis. Electronically Signed   By: Allegra Lai M.D.   On: 09/11/2022 16:23    Procedures Procedures   Medications Ordered in ED Medications  lactated ringers bolus 1,000 mL (0 mLs Intravenous Stopped 09/11/22 1500)  ondansetron (ZOFRAN) injection 4 mg (4 mg Intravenous Given 09/11/22 1421)   HYDROmorphone (DILAUDID) injection 1 mg (1 mg Intravenous Given 09/11/22 1421)  lactated ringers bolus 1,000 mL (0 mLs Intravenous Stopped 09/11/22 1845)  HYDROmorphone (DILAUDID) injection 1 mg (1 mg Intravenous Given 09/11/22 1713)  heparin lock flush 100 UNIT/ML injection ( Intravenous Given 09/11/22 1858)    ED Course/ Medical Decision Making/ A&P Clinical Course as of 09/11/22 2348  Tue Sep 11, 2022  1630 US Abdomen Limited RUQ (LIVER/GB) [OZ]  1646 US Abdomen Limited RUQ (LIVER/GB) [OZ]    Clinical Course User Index [OZ] Smitty Knudsen, PA-C                           Medical Decision Making Amount and/or Complexity of Data Reviewed Labs: ordered. Radiology: ordered. Decision-making details documented in ED Course.  Risk Prescription drug management.   This patient presents to the ED for concern of nausea and abdominal pain, this involves an extensive number of treatment options, and is a complaint that carries with it a high risk of complications and morbidity.  The differential diagnosis includes pancreatitis, cholecystitis, appendicitis, bowel obstruction   Co morbidities that complicate the patient evaluation  Metastatic colon cancer with metastases to the liver   Lab Tests:  I Ordered, and personally interpreted labs.  The pertinent results include: CBC and CMP largely unremarkable but there is some minor dehydration with hyponatremia and hypochloremia, normal lipase   Imaging Studies ordered:  I ordered imaging studies including ultrasound of the abdomen I independently visualized and interpreted imaging which showed cholelithiasis with some gallbladder inflammation but no acute indications for cholecystitis with a negative Murphy sign I agree with the radiologist interpretation   Consultations Obtained:  I requested consultation with the general surgery,  and discussed lab and imaging findings as well as pertinent plan - they recommend: Dr. Dwain Sarna not  acutely impressed with ultrasound findings and I believe that this is consistent with cholecystitis given patient's history.  Advised considering possible admission to medicine for HIDA scan for further evaluation and for symptomatic control.   Problem List / ED Course / Critical interventions / Medication management  Patient  presented to the emergency department complaints of nausea and abdominal pain.  She does have a prior history of metastatic colon cancer with metastases to the liver.  She recently had an ileostomy placed due to small bowel obstruction.  She reports the majority pain is in her right side towards right flank.  Patient and daughter are concerned primarily about patient's inability to tolerate oral intake well at home is almost any food or drink causes patient to become nauseous and vomit.  Given presentation, lab workup initiated as well as ultrasound the right upper quadrant given previous CT abdomen finding showing some signs of gallstones.  There appears to be cholelithiasis present without any acute findings for cholecystitis as patient had a negative sonographic Murphy sign.  Discussed with Dr. Dwain Sarna with general surgery regarding this patient he does not believe that patient symptoms are consistent with acute cholecystitis given lack of finding specific on imaging as well as no fever and no leukocytosis.  He does not recommend admission to surgery at this time but would consider possible medicine admission for HIDA scan for further testing.  After discussing with Dr. Doreen Salvage recommendations with patient, patient would prefer to discharge home instead of admission to medicine to try to manage symptoms as best that she can bear before considering further evaluation.  Patient does report that she has an appointment set with Dr. Dwain Sarna in 2 days for further evaluation of ileostomy and at that time she will likely ask for some assessment for abdominal pain.  I believe the patient  is currently stable for discharge home if she has been able to tolerate minimal oral intake and symptoms are improved with pain control and antiemetics.  Did have strict strict return precautions for patient to return if her symptoms are worsening.  Patient is agreeable to treatment plan verbalized understanding of return precautions.  All questions answered prior to patient discharge. I ordered medication including Zofran, Dilaudid, fluids for nausea, pain, dehydration Reevaluation of the patient after these medicines showed that the patient improved I have reviewed the patients home medicines and have made adjustments as needed   Social Determinants of Health:  Metastatic cancer   Test / Admission - Considered:  Consider inpatient admission to medicine for potential HIDA workup for possible cholecystitis.  At this time do not believe that patient would benefit from admission more so than outpatient management and patient is in agreement with this.  Prescription for Reglan sent to patient's pharmacy as alternative to Zofran that she does not feel is working well for nausea.   Final Clinical Impression(s) / ED Diagnoses Final diagnoses:  Bilious vomiting with nausea  Biliary calculus of other site without obstruction    Rx / DC Orders ED Discharge Orders          Ordered    metoCLOPramide (REGLAN) 10 MG tablet  Every 6 hours        09/11/22 1838              Smitty Knudsen, PA-C 09/11/22 2349    Lonell Grandchild, MD 09/19/22 1504

## 2022-09-11 NOTE — ED Triage Notes (Signed)
Pt c/o consistent vomiting "x1.5wks." States she's supposed to get "a patch here at 4p, but can't take it." Pt w stage 4 colon cancer, gallstones, advised of need for chole but "need to find surgeon." Endorses associated "severe" back pain, weak R leg- "can't lift it up," dark urine.

## 2022-09-11 NOTE — Progress Notes (Signed)
[facility]   Reason for visit:  RLQ ileostomy with retention rod in place (to be removed Thursday) HPI:  Colon cancer with resection and colostomy Past Medical History:  Diagnosis Date   Colon cancer metastasized to liver 05/05/2021   Family history of lung cancer    Family history of pancreatic cancer    Goals of care, counseling/discussion 05/05/2021   History of radiation therapy    Lumbar Spine- 07/12/22-07/25/22- Dr. Antony Blackbird   Family History  Problem Relation Age of Onset   Lung cancer Mother    Diabetes Maternal Grandmother    Pancreatic cancer Maternal Grandfather    Multiple sclerosis Maternal Aunt    Alcohol abuse Maternal Uncle    Allergies  Allergen Reactions   Augmentin [Amoxicillin-Pot Clavulanate] Diarrhea and Other (See Comments)    Extreme diarrhea, abdominal pain.   Irinotecan Other (See Comments)    Flushing, tingling, visual disturbance   Metronidazole Diarrhea and Other (See Comments)   Vancomycin Rash and Other (See Comments)    "Red Man Syndrome"   Latex Rash   Tape Rash   Wound Dressing Adhesive Rash   Current Outpatient Medications  Medication Sig Dispense Refill Last Dose   scopolamine (TRANSDERM-SCOP) 1 MG/3DAYS Place one patch behind the ear every three days. 10 patch 3    baclofen (LIORESAL) 10 MG tablet Take 1 tablet (10 mg total) by mouth 3 (three) times daily. 60 each 2    cyclobenzaprine (FLEXERIL) 10 MG tablet Take 1 tablet (10 mg total) by mouth 3 (three) times daily as needed for muscle spasms. (Patient taking differently: Take 10 mg by mouth See admin instructions. Take 10 mg by mouth in the morning and at bedtime- and an additional 10 mg once a day as needed for muscle spasms) 90 tablet 3    famotidine (PEPCID) 40 MG tablet Take 1 tablet (40 mg total) by mouth 2 (two) times daily. (Patient not taking: Reported on 08/28/2022) 60 tablet 4    Ferrous Sulfate (IRON) 325 (65 Fe) MG TABS Take 1 tablet by mouth 2 times daily as  needed (loose stool/high ileostomy output). (Patient not taking: Reported on 08/28/2022) 60 tablet 0    fruquintinib (FRUZAQLA) 5 MG capsule Take 1 capsule (5 mg total) by mouth daily. Take for 21 days on, then off for 7 days. Repeat every 28 days. (Patient not taking: Reported on 08/18/2022) 21 capsule 4    HYDROmorphone (DILAUDID) 4 MG tablet Take 1 tablet (4 mg total) by mouth every 2 (two) hours as needed for severe pain. 160 tablet 0    loperamide (IMODIUM A-D) 2 MG tablet Take 2 mg by mouth 4 (four) times daily as needed for diarrhea or loose stools. (Patient not taking: Reported on 08/28/2022)      metoCLOPramide (REGLAN) 10 MG tablet Take 1 tablet (10 mg total) by mouth every 6 (six) hours. 30 tablet 0    ondansetron (ZOFRAN) 4 MG tablet Take 1 tablet (4 mg total) by mouth every 6 (six) hours. 12 tablet 0    polycarbophil (FIBERCON) 625 MG tablet Take 1 tablet (625 mg total) by mouth 2 (two) times daily as needed for diarrhea or loose stools. (Patient not taking: Reported on 08/28/2022) 30 tablet 0    prochlorperazine (COMPAZINE) 10 MG tablet Take 1 tablet (10 mg total) by mouth every 6 (six) hours as needed for nausea or vomiting. (Patient not taking: Reported on 08/28/2022) 60 tablet 0    TYLENOL 8 HOUR  650 MG CR tablet Take 1,300 mg by mouth every 8 (eight) hours as needed for pain.      No current facility-administered medications for this encounter.   Facility-Administered Medications Ordered in Other Encounters  Medication Dose Route Frequency Provider Last Rate Last Admin   atropine 1 MG/ML injection            heparin lock flush 100 unit/mL  500 Units Intravenous Once Josph Macho, MD       sodium chloride flush (NS) 0.9 % injection 10 mL  10 mL Intracatheter Once PRN Erenest Blank, NP       sodium chloride flush (NS) 0.9 % injection 10 mL  10 mL Intravenous PRN Josph Macho, MD   10 mL at 10/31/21 1258   ROS  Review of Systems  Gastrointestinal:        RLQ ileostomy Colon  cancer  All other systems reviewed and are negative.  Vital signs:  BP (!) 125/90 (BP Location: Right Arm)   Pulse (!) 104   Temp (!) 97.4 F (36.3 C) (Oral)   Resp (!) 22   SpO2 96%  Exam:  Physical Exam Vitals reviewed.  Constitutional:      Appearance: Normal appearance.  Abdominal:     Palpations: Abdomen is soft.  Skin:    General: Skin is warm and dry.     Findings: Erythema, lesion and rash present.  Neurological:     Mental Status: She is alert and oriented to person, place, and time.  Psychiatric:        Mood and Affect: Mood normal.        Behavior: Behavior normal.     Stoma type/location:  RLQ ileostomy Stomal assessment/size:  1 3/4" but cut larger to accommodate retention rod.  Peristomal assessment:  skin remains red and irritated near retention.  Will continue stoma powder and skin prep Treatment options for stomal/peristomal skin: 2 piece pouch, stoma powder, skin prep and barrier ring to promote seal around oval stoma Output: soft brown stool Ostomy pouching: 2pc.  Education provided:  continue ostomy belt, 1 piece pouch, skin will likely improve once retention is removed     Impression/dx  Ileostomy Contact dermatitis Discussion  See back one week as needed Plan  Extra pouches given, will enroll with [facility] for supplies.     Visit time: 45 minutes.   Rebecca Hudson FNP-BC

## 2022-09-12 ENCOUNTER — Other Ambulatory Visit (HOSPITAL_BASED_OUTPATIENT_CLINIC_OR_DEPARTMENT_OTHER): Payer: Self-pay

## 2022-09-12 ENCOUNTER — Telehealth: Payer: Self-pay | Admitting: General Practice

## 2022-09-12 ENCOUNTER — Other Ambulatory Visit: Payer: Self-pay

## 2022-09-12 ENCOUNTER — Other Ambulatory Visit: Payer: Self-pay | Admitting: *Deleted

## 2022-09-12 ENCOUNTER — Telehealth: Payer: Self-pay | Admitting: *Deleted

## 2022-09-12 ENCOUNTER — Telehealth: Payer: Self-pay | Admitting: Nurse Practitioner

## 2022-09-12 MED ORDER — HYDROMORPHONE HCL 4 MG PO TABS
4.0000 mg | ORAL_TABLET | ORAL | 0 refills | Status: DC | PRN
  Filled 2022-09-12: qty 160, 14d supply, fill #0

## 2022-09-12 MED ORDER — HYDROMORPHONE HCL 4 MG PO TABS: 4.0000 mg | ORAL_TABLET | ORAL | 0 refills | Status: DC | PRN

## 2022-09-12 NOTE — Transitions of Care (Post Inpatient/ED Visit) (Signed)
   09/12/2022  Name: Rebecca Bullock MRN: [id number] DOB: 1966-05-23  Today's TOC FU Call Status: Today's TOC FU Call Status:: Successful TOC FU Call Competed TOC FU Call Complete Date: 09/12/22  Transition Care Management Follow-up Telephone Call Date of Discharge: 09/11/22 Discharge Facility: [facility] Type of Discharge: Emergency Department Reason for ED Visit: Other: (Vomiting) How have you been since you were released from the hospital?: Same Any questions or concerns?: No  Items Reviewed: Did you receive and understand the discharge instructions provided?: Yes Medications obtained and verified?: Yes (Medications Reviewed) Any new allergies since your discharge?: No Dietary orders reviewed?: NA Do you have support at home?: Yes  Home Care and Equipment/Supplies: Were Home Health Services Ordered?: NA Any new equipment or medical supplies ordered?: NA  Functional Questionnaire: Do you need assistance with bathing/showering or dressing?: No Do you need assistance with meal preparation?: No Do you need assistance with eating?: No Do you have difficulty maintaining continence: No Do you need assistance with getting out of bed/getting out of a chair/moving?: No Do you have difficulty managing or taking your medications?: No  Follow up appointments reviewed: PCP Follow-up appointment confirmed?: NA Specialist Hospital Follow-up appointment confirmed?: Yes (surgeon) Date of Specialist follow-up appointment?: 09/13/22 Follow-Up Specialty Provider:: surgeon for pre op Do you need transportation to your follow-up appointment?: No Do you understand care options if your condition(s) worsen?: Yes-patient verbalized understanding    SIGNATURE Modesto Charon, RN BSN

## 2022-09-12 NOTE — Telephone Encounter (Signed)
Patient called stating that she takes Dilaudid 4 mg every 2 hours and will need a prescription.  Asking if this will be possible with Dr Myna Hidalgo out of the office.  Assured her that most days, Dr Myna Hidalgo checks his messages while out of the office.  Message sent to dr Myna Hidalgo about patients pain level even with taking Dilaudid every 2 hours and that Patient will consider something long acting.  Patient aware message will be sent.

## 2022-09-14 ENCOUNTER — Other Ambulatory Visit (HOSPITAL_COMMUNITY): Payer: Self-pay | Admitting: Nurse Practitioner

## 2022-09-14 DIAGNOSIS — L24B3 Irritant contact dermatitis related to fecal or urinary stoma or fistula: Secondary | ICD-10-CM

## 2022-09-14 DIAGNOSIS — K9413 Enterostomy malfunction: Secondary | ICD-10-CM | POA: Insufficient documentation

## 2022-09-17 NOTE — Progress Notes (Signed)
Patient is now on fruquintinib, bevacizumab discontinued per Dr. Gustavo Lah instructions.

## 2022-09-19 ENCOUNTER — Inpatient Hospital Stay: Payer: Medicaid Other | Admitting: Dietician

## 2022-09-19 NOTE — Progress Notes (Signed)
Nutrition Follow Up:   Called patient on home/cell phone to monitor PO intake and NIS.  She has been recently seen in ER with nausea, and concerns with her ostomy. Struggling to eat breakfast or lunch (vomits).  By dinner she can keep something.  Koolaid, jellos with fruit, and popsicles are working in the morning, can't tolerate salty foods or sport drinks.   Getting 20oz water overnight and with meds getting at lease 60oz/day. Tries to eat noodles and noodles soups (packaged) but she has vomited them sometimes.  Asked about Greek yogurts she said depends on day.  Hasn't tried eggs, offered suggestion for ground chicken or tofu in noodle soups.  She admits she hasn't tried and creamy ONS for a while but she couldn't tolerate the texture in past.    Medications: taking Reglan but hasn't started Zofran yet   Labs: 09/11/22  Na 134, albumin 3.3     Anthropometrics: lost 7# past 2 weeks   Height: 61" Weight:  09/04/22  171# 08/28/22  171#     08/07/22  178# 08/13/22  174# 07/26/22  178.3 07/17/22  188# UBW: 185# BMI: 32.32     Estimated Energy Needs   Kcals: 2300-2700 Protein: 94-117 Fluid: 3 L   NUTRITION DIAGNOSIS: Inadequate PO intake for increased needs     INTERVENTION:  Encouraged small frequent bland foods.  Trial of saltines, toast, loose cereals, to settle nausea.  Encouraged trial of Zofran (using patch which is helping a little) Encouraged trial of adding powdered ginger to her Koolaids, monitor tolerance. Discussed lean proteins to add to noodle soups (ground chicken, tofu).  Encouraged trial of clear ONS (Boost breeze or Ensure clear) and getting samples at Cancer center for Ensure clear and Ensure complete and Pedialyte powders,    MONITORING, EVALUATION, GOAL: weight, PO intake, Nutrition Impact Symptoms, labs Goal is weight maintenance.   Next Visit: Remote after MD visit next week   Gennaro Africa, RDN, LDN Registered Dietitian, [facility] Part Time  Remote (Usual office hours: Tuesday-Thursday) Cell: [phone]

## 2022-09-20 ENCOUNTER — Other Ambulatory Visit: Payer: Self-pay

## 2022-09-20 ENCOUNTER — Other Ambulatory Visit (HOSPITAL_BASED_OUTPATIENT_CLINIC_OR_DEPARTMENT_OTHER): Payer: Self-pay

## 2022-09-21 ENCOUNTER — Encounter: Payer: Self-pay | Admitting: Family

## 2022-09-21 ENCOUNTER — Ambulatory Visit: Payer: Medicaid Other

## 2022-09-21 ENCOUNTER — Other Ambulatory Visit: Payer: Self-pay

## 2022-09-21 ENCOUNTER — Inpatient Hospital Stay: Payer: Medicaid Other

## 2022-09-21 ENCOUNTER — Encounter: Payer: Self-pay | Admitting: Hematology & Oncology

## 2022-09-21 ENCOUNTER — Telehealth: Payer: Self-pay | Admitting: *Deleted

## 2022-09-21 ENCOUNTER — Other Ambulatory Visit: Payer: Self-pay | Admitting: *Deleted

## 2022-09-21 ENCOUNTER — Inpatient Hospital Stay (HOSPITAL_BASED_OUTPATIENT_CLINIC_OR_DEPARTMENT_OTHER): Payer: Medicaid Other | Admitting: Hematology & Oncology

## 2022-09-21 ENCOUNTER — Other Ambulatory Visit (HOSPITAL_BASED_OUTPATIENT_CLINIC_OR_DEPARTMENT_OTHER): Payer: Self-pay

## 2022-09-21 VITALS — BP 125/56 | HR 55 | Resp 18

## 2022-09-21 VITALS — BP 148/88 | HR 92 | Temp 96.0°F | Resp 20 | Ht 61.0 in | Wt 162.0 lb

## 2022-09-21 DIAGNOSIS — D509 Iron deficiency anemia, unspecified: Secondary | ICD-10-CM

## 2022-09-21 DIAGNOSIS — C787 Secondary malignant neoplasm of liver and intrahepatic bile duct: Secondary | ICD-10-CM | POA: Diagnosis not present

## 2022-09-21 DIAGNOSIS — C19 Malignant neoplasm of rectosigmoid junction: Secondary | ICD-10-CM | POA: Diagnosis not present

## 2022-09-21 DIAGNOSIS — D649 Anemia, unspecified: Secondary | ICD-10-CM

## 2022-09-21 DIAGNOSIS — C189 Malignant neoplasm of colon, unspecified: Secondary | ICD-10-CM | POA: Diagnosis not present

## 2022-09-21 DIAGNOSIS — R0602 Shortness of breath: Secondary | ICD-10-CM

## 2022-09-21 DIAGNOSIS — R110 Nausea: Secondary | ICD-10-CM

## 2022-09-21 DIAGNOSIS — Z95828 Presence of other vascular implants and grafts: Secondary | ICD-10-CM

## 2022-09-21 LAB — IRON AND IRON BINDING CAPACITY (CC-WL,HP ONLY)
Iron: 58 ug/dL (ref 28–170)
Saturation Ratios: 20 % (ref 10.4–31.8)
TIBC: 286 ug/dL (ref 250–450)
UIBC: 228 ug/dL (ref 148–442)

## 2022-09-21 LAB — CBC WITH DIFFERENTIAL (CANCER CENTER ONLY)
Abs Immature Granulocytes: 0.01 10*3/uL (ref 0.00–0.07)
Basophils Absolute: 0 10*3/uL (ref 0.0–0.1)
Basophils Relative: 0 %
Eosinophils Absolute: 0.1 10*3/uL (ref 0.0–0.5)
Eosinophils Relative: 2 %
HCT: 38 % (ref 36.0–46.0)
Hemoglobin: 12.2 g/dL (ref 12.0–15.0)
Immature Granulocytes: 0 %
Lymphocytes Relative: 19 %
Lymphs Abs: 0.7 10*3/uL (ref 0.7–4.0)
MCH: 29.4 pg (ref 26.0–34.0)
MCHC: 32.1 g/dL (ref 30.0–36.0)
MCV: 91.6 fL (ref 80.0–100.0)
Monocytes Absolute: 0.5 10*3/uL (ref 0.1–1.0)
Monocytes Relative: 15 %
Neutro Abs: 2.3 10*3/uL (ref 1.7–7.7)
Neutrophils Relative %: 64 %
Platelet Count: 194 10*3/uL (ref 150–400)
RBC: 4.15 MIL/uL (ref 3.87–5.11)
RDW: 15.3 % (ref 11.5–15.5)
WBC Count: 3.5 10*3/uL — ABNORMAL LOW (ref 4.0–10.5)
nRBC: 0 % (ref 0.0–0.2)

## 2022-09-21 LAB — CMP (CANCER CENTER ONLY)
ALT: 25 U/L (ref 0–44)
AST: 47 U/L — ABNORMAL HIGH (ref 15–41)
Albumin: 3.7 g/dL (ref 3.5–5.0)
Alkaline Phosphatase: 412 U/L — ABNORMAL HIGH (ref 38–126)
Anion gap: 10 (ref 5–15)
BUN: <5 mg/dL — ABNORMAL LOW (ref 6–20)
CO2: 28 mmol/L (ref 22–32)
Calcium: 9.2 mg/dL (ref 8.9–10.3)
Chloride: 98 mmol/L (ref 98–111)
Creatinine: 0.61 mg/dL (ref 0.44–1.00)
GFR, Estimated: >60 mL/min (ref >60)
Glucose, Bld: 119 mg/dL — ABNORMAL HIGH (ref 70–99)
Potassium: 3.4 mmol/L — ABNORMAL LOW (ref 3.5–5.1)
Sodium: 136 mmol/L (ref 135–145)
Total Bilirubin: 0.6 mg/dL (ref 0.3–1.2)
Total Protein: 7.3 g/dL (ref 6.5–8.1)

## 2022-09-21 LAB — SAMPLE TO BLOOD BANK

## 2022-09-21 LAB — FERRITIN: Ferritin: 312 ng/mL — ABNORMAL HIGH (ref 11–307)

## 2022-09-21 LAB — LACTATE DEHYDROGENASE: LDH: 582 U/L — ABNORMAL HIGH (ref 98–192)

## 2022-09-21 LAB — MAGNESIUM: Magnesium: 1.9 mg/dL (ref 1.7–2.4)

## 2022-09-21 MED ORDER — LORAZEPAM 2 MG/ML IJ SOLN: 0.5000 mg | Freq: Once | INTRAMUSCULAR | Status: DC

## 2022-09-21 MED ORDER — HEPARIN SOD (PORK) LOCK FLUSH 100 UNIT/ML IV SOLN
500.0000 [IU] | Freq: Once | INTRAVENOUS | Status: AC
  Administered 2022-09-21: 500 [IU] via INTRAVENOUS

## 2022-09-21 MED ORDER — SODIUM CHLORIDE 0.9 % IV SOLN
150.0000 mg | Freq: Once | INTRAVENOUS | Status: DC
  Filled 2022-09-21: qty 5

## 2022-09-21 MED ORDER — MORPHINE SULFATE ER 30 MG PO TBCR
30.0000 mg | EXTENDED_RELEASE_TABLET | Freq: Two times a day (BID) | ORAL | 0 refills | Status: DC
  Filled 2022-09-21: qty 60, 30d supply, fill #0
  Filled 2022-09-21: qty 20, 10d supply, fill #0
  Filled 2022-09-21: qty 60, 30d supply, fill #0
  Filled 2022-09-21: qty 40, 20d supply, fill #0

## 2022-09-21 MED ORDER — KETOROLAC TROMETHAMINE 15 MG/ML IJ SOLN
INTRAMUSCULAR | Status: AC
  Filled 2022-09-21: qty 2

## 2022-09-21 MED ORDER — HYDROMORPHONE HCL 4 MG/ML IJ SOLN
INTRAMUSCULAR | Status: AC
  Filled 2022-09-21: qty 1

## 2022-09-21 MED ORDER — SODIUM CHLORIDE 0.9% FLUSH
10.0000 mL | INTRAVENOUS | Status: DC | PRN
  Administered 2022-09-21: 10 mL via INTRAVENOUS

## 2022-09-21 MED ORDER — LORAZEPAM 2 MG/ML IJ SOLN
0.5000 mg | Freq: Once | INTRAMUSCULAR | Status: AC
  Administered 2022-09-21: 0.5 mg via INTRAVENOUS

## 2022-09-21 MED ORDER — SODIUM CHLORIDE 0.9 % IV SOLN
150.0000 mg | Freq: Once | INTRAVENOUS | Status: AC
  Administered 2022-09-21: 150 mg via INTRAVENOUS
  Filled 2022-09-21: qty 150

## 2022-09-21 MED ORDER — KETOROLAC TROMETHAMINE 15 MG/ML IJ SOLN
30.0000 mg | Freq: Once | INTRAMUSCULAR | Status: AC
  Administered 2022-09-21: 30 mg via INTRAVENOUS

## 2022-09-21 MED ORDER — LORAZEPAM 0.5 MG PO TABS
0.5000 mg | ORAL_TABLET | ORAL | 0 refills | Status: DC | PRN
  Filled 2022-09-21: qty 60, 8d supply, fill #0
  Filled 2022-09-21: qty 60, 10d supply, fill #0
  Filled 2022-09-21: qty 60, 20d supply, fill #0
  Filled 2022-09-21 (×2): qty 60, 10d supply, fill #0

## 2022-09-21 MED ORDER — LORAZEPAM 2 MG/ML IJ SOLN
INTRAMUSCULAR | Status: AC
  Filled 2022-09-21: qty 1

## 2022-09-21 MED ORDER — HYDROMORPHONE HCL 1 MG/ML IJ SOLN
3.0000 mg | Freq: Once | INTRAMUSCULAR | Status: AC
  Administered 2022-09-21: 3 mg via INTRAVENOUS

## 2022-09-21 MED ORDER — KETOROLAC TROMETHAMINE 15 MG/ML IJ SOLN: 30.0000 mg | Freq: Once | INTRAMUSCULAR | Status: DC

## 2022-09-21 MED ORDER — HYDROMORPHONE HCL 1 MG/ML IJ SOLN: 3.0000 mg | Freq: Once | INTRAMUSCULAR | Status: DC

## 2022-09-21 MED ORDER — SODIUM CHLORIDE 0.9 % IV SOLN: Freq: Once | INTRAVENOUS | Status: DC | PRN

## 2022-09-21 MED ORDER — MEGESTROL ACETATE 400 MG/10ML PO SUSP
400.0000 mg | Freq: Two times a day (BID) | ORAL | 2 refills | Status: DC
  Filled 2022-09-21 (×2): qty 480, 24d supply, fill #0
  Filled 2022-11-24: qty 480, 24d supply, fill #1

## 2022-09-21 MED ORDER — OLANZAPINE 10 MG PO TABS
10.0000 mg | ORAL_TABLET | Freq: Every day | ORAL | 3 refills | Status: DC
  Filled 2022-09-21 (×3): qty 30, 30d supply, fill #0
  Filled 2022-10-21 – 2022-10-23 (×2): qty 30, 30d supply, fill #1
  Filled 2022-11-24: qty 30, 30d supply, fill #2

## 2022-09-21 NOTE — Telephone Encounter (Signed)
Call received from patient stating that she started the Fruquintinib on Saturday and has had new itching and rash to her scalp and arms, nausea, bilateral pain to shoulders and bilateral hips, difficulty getting OOB and that her fingertips are turning red at times.  She states that she is taking Dilaudid 4 mg every 2 hrs and Baclofen every 6 hrs for pain. Dr. Myna Hidalgo notified and would like for Aracelly to come in for lab work and to be seen.  Shacoya notified and states that she can be here at 10:00AM.  Message sent to scheduling.

## 2022-09-21 NOTE — Progress Notes (Signed)
Hematology and Oncology Follow Up Visit  Rebecca Bullock [id number] 1965-08-16 57 y.o. 09/21/2022   Principle Diagnosis:  Metastatic colorectal cancer-liver metastasis- BRAF (+)   Current Therapy:        Status post cycle 1 of chemotherapy with FOLFOXIRI/Avastin Encorafenib/Vectibix -- started on 06/19/2021, s/p cycle #6 -- d/c on 10/31/2021 Intrahepatic TACE -- 03/29/2022 FOLFOXIRI/Avastin-- s/p cycle  #5-- start on 02/19/2022, -oxaliplatin dropped on 04/03/2022 -- d/c on 07/03/2022 IV iron-Ferrlecit given on 05/10/2022  Lonsurf/Avastin -- s/p cycle #1 -- start on 07/07/2022 -DC on 08/15/2022 Zometa 4 mg IV every 3 months -next dose on 09/2022  XRT to L1 vertebral body --start on 07/10/2022 Fruquintinib 5 mg po q day (21/7) --  start on 04/`09/2022               Interim History:  Rebecca Bullock is here today for an unscheduled visit.  Unfortunately, Rebecca Bullock is just declining.  I really hate this for Rebecca Bullock.  I realize that Rebecca Bullock is trying Rebecca Bullock best.  Rebecca Bullock is lost quite a bit of weight since we last saw Rebecca Bullock.  I think Rebecca Bullock weight is now down about 11 pounds since we last saw Rebecca Bullock..    Rebecca Bullock started the fruquintinib last Saturday.  Rebecca Bullock is already having problems with it.  Rebecca Bullock show is a patient of Rebecca Bullock hands.  Rebecca Bullock is having a lot of bony pain.  Rebecca Bullock has still issues with nausea and vomiting.  Again, the weight loss really, really bothers me.  I think that if we cannot get Rebecca Bullock to stabilize or gain some weight, then I think that Rebecca Bullock prognosis is good to be incredibly limited.  I talked to Rebecca Bullock about this whole situation.  I know that Rebecca Bullock is going to meet with Palliative Care next week..  Rebecca Bullock is having a lot of pain.  We are trying Rebecca Bullock best to help with Rebecca Bullock pain.  The pain is in Rebecca Bullock hips and Rebecca Bullock shoulders.  I know Rebecca Bullock has had radiotherapy to the lower back.  Rebecca Bullock is on hydromorphone.  Rebecca Bullock takes this every couple hours.  We had our town try to get Rebecca Bullock on a long-acting pain medication.  Rebecca Bullock does not do well with Duragesic  patches.  Rebecca Bullock also has not taken anything for Rebecca Bullock appetite.  We will try Rebecca Bullock on some Megace elixir.  I do not think I can get Marinol for Rebecca Bullock because of the national backorder.  I will also try Rebecca Bullock on some Zyprexa at bedtime to help with vomiting.  I really doubt that Rebecca Bullock is a candidate for any surgery.  There is some question of whether or not Rebecca Bullock needs to have Rebecca Bullock gallbladder out.  Rebecca Bullock is in no shape for any surgery.  I really doubt that Rebecca Bullock has any gallbladder issues.  Rebecca Bullock liver tests actually are little bit better.  Rebecca Bullock definitely is dehydrated.  We are giving Rebecca Bullock some IV fluids.  I will give Rebecca Bullock some supplements in the IV.  I will give Rebecca Bullock some IV Emend.  I will give him IV Toradol.  I will give Rebecca Bullock some IV Dilaudid.  I will also give Rebecca Bullock some IV Ativan.  I just want Rebecca Bullock to have better quality of life.  Rebecca Bullock is GIST is miserable.  I just wish we would be able to come up with a solution for Rebecca Bullock discomfort.  Again we have stopped the fruquintinib.  Maybe, the dose is too high for Rebecca Bullock.  At the present time, I  would say Rebecca Bullock performance status is probably ECOG 3.    Medications:  Allergies as of 09/21/2022       Reactions   Augmentin [amoxicillin-pot Clavulanate] Diarrhea, Other (See Comments)   Extreme diarrhea, abdominal pain.   Irinotecan Other (See Comments)   Flushing, tingling, visual disturbance   Metronidazole Diarrhea, Other (See Comments)   Vancomycin Rash, Other (See Comments)   "Red Man Syndrome"   Latex Rash   Tape Rash   Wound Dressing Adhesive Rash        Medication List        Accurate as of September 21, 2022 12:13 PM. If you have any questions, ask your nurse or doctor.          STOP taking these medications    Tylenol 8 Hour 650 MG CR tablet Generic drug: acetaminophen Stopped by: Josph Macho, MD       TAKE these medications    baclofen 10 MG tablet Commonly known as: LIORESAL Take 1 tablet (10 mg total) by mouth 3 (three) times daily.    cyclobenzaprine 10 MG tablet Commonly known as: FLEXERIL Take 1 tablet (10 mg total) by mouth 3 (three) times daily as needed for muscle spasms.   famotidine 40 MG tablet Commonly known as: PEPCID Take 1 tablet (40 mg total) by mouth 2 (two) times daily.   FiberCon 625 MG tablet Generic drug: polycarbophil Take 1 tablet (625 mg total) by mouth 2 (two) times daily as needed for diarrhea or loose stools.   Fruzaqla 5 MG capsule Generic drug: fruquintinib Take 1 capsule (5 mg total) by mouth daily. Take for 21 days on, then off for 7 days. Repeat every 28 days.   HYDROmorphone 4 MG tablet Commonly known as: Dilaudid Take 1 tablet (4 mg total) by mouth every 2 (two) hours as needed for severe pain.   Iron 325 (65 Fe) MG Tabs Take 1 tablet by mouth 2 times daily as needed (loose stool/high ileostomy output).   loperamide 2 MG tablet Commonly known as: IMODIUM A-D Take 2 mg by mouth 4 (four) times daily as needed for diarrhea or loose stools.   metoCLOPramide 10 MG tablet Commonly known as: REGLAN Take 1 tablet (10 mg total) by mouth every 6 (six) hours.   ondansetron 4 MG tablet Commonly known as: ZOFRAN Take 1 tablet (4 mg total) by mouth every 6 (six) hours.   prochlorperazine 10 MG tablet Commonly known as: COMPAZINE Take 1 tablet (10 mg total) by mouth every 6 (six) hours as needed for nausea or vomiting.   Transderm-Scop 1 MG/3DAYS Generic drug: scopolamine Place one patch behind the ear every three days.        Allergies:  Allergies  Allergen Reactions   Augmentin [Amoxicillin-Pot Clavulanate] Diarrhea and Other (See Comments)    Extreme diarrhea, abdominal pain.   Irinotecan Other (See Comments)    Flushing, tingling, visual disturbance   Metronidazole Diarrhea and Other (See Comments)   Vancomycin Rash and Other (See Comments)    "Red Man Syndrome"   Latex Rash   Tape Rash   Wound Dressing Adhesive Rash    Past Medical History, Surgical history,  Social history, and Family History were reviewed and updated.  Review of Systems: Review of Systems  Constitutional: Negative.   HENT: Negative.    Eyes: Negative.   Respiratory: Negative.    Cardiovascular: Negative.   Gastrointestinal: Negative.        Constipation alternating with diarrhea.  Genitourinary: Negative.   Musculoskeletal: Negative.   Skin: Negative.   Neurological: Negative.   Endo/Heme/Allergies: Negative.   Psychiatric/Behavioral: Negative.       Physical Exam: Rebecca Bullock vital signs show temperature of 97 6.  Pulse 106.  Blood pressure 111/82.  Weight is 171 pounds.      Wt Readings from Last 3 Encounters:  09/21/22 162 lb (73.5 kg)  09/04/22 171 lb 1.2 oz (77.6 kg)  08/28/22 171 lb (77.6 kg)    Physical Exam Vitals reviewed.  HENT:     Head: Normocephalic and atraumatic.  Eyes:     Pupils: Pupils are equal, round, and reactive to light.  Cardiovascular:     Rate and Rhythm: Normal rate and regular rhythm.     Heart sounds: Normal heart sounds.  Pulmonary:     Effort: Pulmonary effort is normal.     Breath sounds: Normal breath sounds.  Abdominal:     General: Bowel sounds are normal.     Palpations: Abdomen is soft.     Comments: Abdominal exam is soft.  Rebecca Bullock has a colostomy in the right lower quadrant.  Rebecca Bullock has no fluid wave.  There is no guarding or rebound tenderness.  There is no hepatomegaly.  Musculoskeletal:        General: No tenderness or deformity. Normal range of motion.     Cervical back: Normal range of motion.  Lymphadenopathy:     Cervical: No cervical adenopathy.  Skin:    General: Skin is warm and dry.     Findings: No erythema or rash.  Neurological:     Mental Status: Rebecca Bullock is alert and oriented to person, place, and time.  Psychiatric:        Behavior: Behavior normal.        Thought Content: Thought content normal.        Judgment: Judgment normal.      Lab Results  Component Value Date   WBC 3.5 (L) 09/21/2022   HGB  12.2 09/21/2022   HCT 38.0 09/21/2022   MCV 91.6 09/21/2022   PLT 194 09/21/2022   Lab Results  Component Value Date   FERRITIN 303 08/28/2022   IRON 72 08/28/2022   TIBC 316 08/28/2022   UIBC 244 08/28/2022   IRONPCTSAT 23 08/28/2022   Lab Results  Component Value Date   RETICCTPCT 1.6 08/07/2022   RBC 4.15 09/21/2022   No results found for: "KPAFRELGTCHN", "LAMBDASER", "KAPLAMBRATIO" No results found for: "IGGSERUM", "IGA", "IGMSERUM" No results found for: "TOTALPROTELP", "ALBUMINELP", "A1GS", "A2GS", "BETS", "BETA2SER", "GAMS", "MSPIKE", "SPEI"   Chemistry      Component Value Date/Time   NA 136 09/21/2022 1004   K 3.4 (L) 09/21/2022 1004   CL 98 09/21/2022 1004   CO2 28 09/21/2022 1004   BUN <5 (L) 09/21/2022 1004   CREATININE 0.61 09/21/2022 1004   CREATININE 0.56 03/23/2021 0000      Component Value Date/Time   CALCIUM 9.2 09/21/2022 1004   ALKPHOS 412 (H) 09/21/2022 1004   AST 47 (H) 09/21/2022 1004   ALT 25 09/21/2022 1004   BILITOT 0.6 09/21/2022 1004       Impression and Plan: Rebecca Bullock is a very charming  57 yo caucasian female with metastatic colon cancer.  Rebecca Bullock had disease confined to Rebecca Bullock liver.  However, Rebecca Bullock then began to have progression.  Rebecca Bullock had disease in Rebecca Bullock L1 vertebral body.  Rebecca Bullock has received radiation therapy to this.  We had Rebecca Bullock on Lonsurf and  then Rebecca Bullock could not tolerate this at all.  Rebecca Bullock has been hospitalized.  Rebecca Bullock then developed a bowel obstruction requiring emergency surgery for this.  We now have Rebecca Bullock on fruquintinib which Rebecca Bullock is having hard time with.  I told Rebecca Bullock to stop the fruquintinib.  Hopefully, we can get Rebecca Bullock to feel better.  I am sure palliative care might be able to help [place] out.  I will figure out how we try to get Rebecca Bullock pain under better control.  Hopefully, we can get Rebecca Bullock nausea and vomiting under better control.  I told Rebecca Bullock that Rebecca Bullock boyfriend could get marijuana up in [place].  If he could, may be this might help.  I will try  Rebecca Bullock on some Megace elixir.  I will also try Rebecca Bullock on some Ativan.  Maybe, Palliative Care might be able to help with symptom control.  We will plan to have Rebecca Bullock come back in a couple weeks.  We will see how Rebecca Bullock is doing at that point.  I think that if Rebecca Bullock is not improving, we probably will have to consider Rebecca Bullock for Hospice.  Josph Macho, MD 4/26/202412:13 PM

## 2022-09-21 NOTE — Patient Instructions (Signed)

## 2022-09-24 ENCOUNTER — Other Ambulatory Visit (HOSPITAL_BASED_OUTPATIENT_CLINIC_OR_DEPARTMENT_OTHER): Payer: Self-pay

## 2022-09-24 ENCOUNTER — Encounter: Payer: Self-pay | Admitting: Nurse Practitioner

## 2022-09-24 ENCOUNTER — Other Ambulatory Visit: Payer: Self-pay

## 2022-09-24 ENCOUNTER — Inpatient Hospital Stay (HOSPITAL_BASED_OUTPATIENT_CLINIC_OR_DEPARTMENT_OTHER): Payer: Medicaid Other | Admitting: Nurse Practitioner

## 2022-09-24 VITALS — BP 128/77 | HR 86 | Temp 97.9°F | Resp 18 | Wt 164.9 lb

## 2022-09-24 DIAGNOSIS — Z515 Encounter for palliative care: Secondary | ICD-10-CM

## 2022-09-24 DIAGNOSIS — G893 Neoplasm related pain (acute) (chronic): Secondary | ICD-10-CM | POA: Diagnosis not present

## 2022-09-24 DIAGNOSIS — C787 Secondary malignant neoplasm of liver and intrahepatic bile duct: Secondary | ICD-10-CM

## 2022-09-24 DIAGNOSIS — C189 Malignant neoplasm of colon, unspecified: Secondary | ICD-10-CM | POA: Diagnosis not present

## 2022-09-24 DIAGNOSIS — R110 Nausea: Secondary | ICD-10-CM

## 2022-09-24 DIAGNOSIS — R530 Neoplastic (malignant) related fatigue: Secondary | ICD-10-CM

## 2022-09-24 DIAGNOSIS — K5900 Constipation, unspecified: Secondary | ICD-10-CM

## 2022-09-24 DIAGNOSIS — Z7189 Other specified counseling: Secondary | ICD-10-CM

## 2022-09-24 DIAGNOSIS — R634 Abnormal weight loss: Secondary | ICD-10-CM

## 2022-09-24 DIAGNOSIS — R630 Anorexia: Secondary | ICD-10-CM

## 2022-09-24 MED ORDER — MELOXICAM 15 MG PO TABS
15.0000 mg | ORAL_TABLET | Freq: Every day | ORAL | 0 refills | Status: DC
Start: 2022-09-24 — End: 2022-10-18
  Filled 2022-09-24: qty 30, 30d supply, fill #0

## 2022-09-24 NOTE — Progress Notes (Signed)
Palliative Medicine [facility]  [phone] [phone]   Name: Rebecca Bullock Date: 09/24/2022 MRN: [id number]  DOB: Mar 17, 1966  Patient Care Team: Nolene Ebbs as PCP - General (Family Medicine) Myna Hidalgo Rose Phi, MD as Medical Oncologist (Oncology)    REASON FOR CONSULTATION: Rebecca Bullock is a 57 y.o. female with oncologic medical history including colon cancer (04/2021) with metastatic disease to liver and bone. Patient underwent a diverting loop ileostomy in march 2024. Palliative ask to see for symptom and pain management and goals of care.    SOCIAL HISTORY:    [name] reports that she has never smoked. She has never used smokeless tobacco. She reports current alcohol use. She reports that she does not currently use drugs.  ADVANCE DIRECTIVES:  None on file  CODE STATUS: Full code  PAST MEDICAL HISTORY: Past Medical History:  Diagnosis Date   Colon cancer metastasized to liver (HCC) 05/05/2021   Family history of lung cancer    Family history of pancreatic cancer    Goals of care, counseling/discussion 05/05/2021   History of radiation therapy    Lumbar Spine- 07/12/22-07/25/22- Dr. Antony Blackbird    PAST SURGICAL HISTORY:  Past Surgical History:  Procedure Laterality Date   IR 3D INDEPENDENT WKST  03/15/2022   IR ANGIOGRAM SELECTIVE EACH ADDITIONAL VESSEL  03/15/2022   IR ANGIOGRAM SELECTIVE EACH ADDITIONAL VESSEL  03/15/2022   IR ANGIOGRAM SELECTIVE EACH ADDITIONAL VESSEL  03/15/2022   IR ANGIOGRAM SELECTIVE EACH ADDITIONAL VESSEL  03/29/2022   IR ANGIOGRAM SELECTIVE EACH ADDITIONAL VESSEL  03/29/2022   IR ANGIOGRAM VISCERAL SELECTIVE  03/15/2022   IR ANGIOGRAM VISCERAL SELECTIVE  03/29/2022   IR EMBO ARTERIAL NOT HEMORR HEMANG INC GUIDE ROADMAPPING  03/15/2022   IR EMBO TUMOR ORGAN ISCHEMIA INFARCT INC GUIDE ROADMAPPING  03/29/2022   IR IMAGING GUIDED PORT INSERTION  05/11/2021   IR RADIOLOGIST EVAL & MGMT   02/15/2022   IR RADIOLOGIST EVAL & MGMT  04/23/2022   IR RADIOLOGIST EVAL & MGMT  06/25/2022   IR US GUIDE VASC ACCESS RIGHT  03/29/2022   LAPAROSCOPY N/A 08/21/2022   Procedure: LAPAROSCOPIC LOOP ILEOSTOMY;  Surgeon: Emelia Loron, MD;  Location: [facility];  Service: General;  Laterality: N/A;   uterine ablation      HEMATOLOGY/ONCOLOGY HISTORY:  Oncology History  Colon cancer metastasized to liver (HCC)  05/05/2021 Initial Diagnosis   Colon cancer metastasized to liver (HCC)   05/05/2021 Cancer Staging   Staging form: Colon and Rectum, AJCC 8th Edition - Clinical stage from 05/05/2021: cT3, cN1b, pM1 - Signed by Josph Macho, MD on 05/05/2021 Histologic grade (G): G2 Histologic grading system: 4 grade system   05/16/2021 - 05/31/2021 Chemotherapy   Patient is on Treatment Plan : COLORECTAL FOLFOXIRI + Bevacizumab q14d     06/19/2021 - 10/18/2021 Chemotherapy   Patient is on Treatment Plan : COLORECTAL Panitumumab q14d (Kras Wild - Type Gene Only)     11/07/2021 - 12/22/2021 Chemotherapy   Patient is on Treatment Plan : COLORECTAL FOLFOXIRI q14d     01/17/2022 - 06/14/2022 Chemotherapy   Patient is on Treatment Plan : COLORECTAL FOLFOXIRI + Bevacizumab q14d     07/17/2022 - 08/07/2022 Chemotherapy   Patient is on Treatment Plan : COLORECTAL Bevacizumab q21d       ALLERGIES:  is allergic to augmentin [amoxicillin-pot clavulanate], irinotecan, metronidazole, vancomycin, latex, tape, and wound dressing adhesive.  MEDICATIONS:  Current Outpatient Medications  Medication Sig  Dispense Refill   meloxicam (MOBIC) 15 MG tablet Take 1 tablet (15 mg total) by mouth daily. 30 tablet 0   baclofen (LIORESAL) 10 MG tablet Take 1 tablet (10 mg total) by mouth 3 (three) times daily. 60 each 2   cyclobenzaprine (FLEXERIL) 10 MG tablet Take 1 tablet (10 mg total) by mouth 3 (three) times daily as needed for muscle spasms. (Patient not taking: Reported on 09/21/2022) 90 tablet 3   famotidine (PEPCID)  40 MG tablet Take 1 tablet (40 mg total) by mouth 2 (two) times daily. 60 tablet 4   Ferrous Sulfate (IRON) 325 (65 Fe) MG TABS Take 1 tablet by mouth 2 times daily as needed (loose stool/high ileostomy output). (Patient not taking: Reported on 08/28/2022) 60 tablet 0   fruquintinib (FRUZAQLA) 5 MG capsule Take 1 capsule (5 mg total) by mouth daily. Take for 21 days on, then off for 7 days. Repeat every 28 days. 21 capsule 4   HYDROmorphone (DILAUDID) 4 MG tablet Take 1 tablet (4 mg total) by mouth every 2 (two) hours as needed for severe pain. 160 tablet 0   loperamide (IMODIUM A-D) 2 MG tablet Take 2 mg by mouth 4 (four) times daily as needed for diarrhea or loose stools. (Patient not taking: Reported on 08/28/2022)     LORazepam (ATIVAN) 0.5 MG tablet Place 1 tablet (0.5 mg total) under the tongue every 4 (four) hours as needed for anxiety. 60 tablet 0   megestrol (MEGACE) 400 MG/10ML suspension Take 10 mLs (400 mg total) by mouth 2 (two) times daily. 960 mL 2   metoCLOPramide (REGLAN) 10 MG tablet Take 1 tablet (10 mg total) by mouth every 6 (six) hours. (Patient not taking: Reported on 09/21/2022) 30 tablet 0   morphine (MS CONTIN) 30 MG 12 hr tablet Take 1 tablet (30 mg total) by mouth every 12 (twelve) hours. 60 tablet 0   OLANZapine (ZYPREXA) 10 MG tablet Take 1 tablet (10 mg total) by mouth at bedtime. 30 tablet 3   ondansetron (ZOFRAN) 4 MG tablet Take 1 tablet (4 mg total) by mouth every 6 (six) hours. 12 tablet 0   prochlorperazine (COMPAZINE) 10 MG tablet Take 1 tablet (10 mg total) by mouth every 6 (six) hours as needed for nausea or vomiting. (Patient not taking: Reported on 08/28/2022) 60 tablet 0   scopolamine (TRANSDERM-SCOP) 1 MG/3DAYS Place one patch behind the ear every three days. 10 patch 3   No current facility-administered medications for this visit.   Facility-Administered Medications Ordered in Other Visits  Medication Dose Route Frequency Provider Last Rate Last Admin    atropine 1 MG/ML injection            heparin lock flush 100 unit/mL  500 Units Intravenous Once Ennever, Rose Phi, MD       HYDROmorphone (DILAUDID) 4 MG/ML injection            ketorolac (TORADOL) 15 MG/ML injection            LORazepam (ATIVAN) 2 MG/ML injection            sodium chloride flush (NS) 0.9 % injection 10 mL  10 mL Intracatheter Once PRN Erenest Blank, NP       sodium chloride flush (NS) 0.9 % injection 10 mL  10 mL Intravenous PRN Josph Macho, MD   10 mL at 10/31/21 1258    VITAL SIGNS: BP 128/77   Pulse 86   Temp 97.9 F (36.6  C)   Resp 18   Wt 74.8 kg   SpO2 98%   BMI 31.16 kg/m  Filed Weights   09/24/22 1439  Weight: 74.8 kg    Estimated body mass index is 31.16 kg/m as calculated from the following:   Height as of 09/21/22: 5\' 1"  (1.549 m).   Weight as of this encounter: 74.8 kg.  LABS: CBC:    Component Value Date/Time   WBC 3.5 (L) 09/21/2022 1004   WBC 4.7 09/11/2022 1430   HGB 12.2 09/21/2022 1004   HCT 38.0 09/21/2022 1004   PLT 194 09/21/2022 1004   MCV 91.6 09/21/2022 1004   NEUTROABS 2.3 09/21/2022 1004   LYMPHSABS 0.7 09/21/2022 1004   MONOABS 0.5 09/21/2022 1004   EOSABS 0.1 09/21/2022 1004   BASOSABS 0.0 09/21/2022 1004   Comprehensive Metabolic Panel:    Component Value Date/Time   NA 136 09/21/2022 1004   K 3.4 (L) 09/21/2022 1004   CL 98 09/21/2022 1004   CO2 28 09/21/2022 1004   BUN <5 (L) 09/21/2022 1004   CREATININE 0.61 09/21/2022 1004   CREATININE 0.56 03/23/2021 0000   GLUCOSE 119 (H) 09/21/2022 1004   CALCIUM 9.2 09/21/2022 1004   AST 47 (H) 09/21/2022 1004   ALT 25 09/21/2022 1004   ALKPHOS 412 (H) 09/21/2022 1004   BILITOT 0.6 09/21/2022 1004   PROT 7.3 09/21/2022 1004   ALBUMIN 3.7 09/21/2022 1004    RADIOGRAPHIC STUDIES: US Abdomen Limited RUQ (LIVER/GB)  Result Date: 09/11/2022 CLINICAL DATA:  Nausea vomiting and back pain EXAM: ULTRASOUND ABDOMEN LIMITED RIGHT UPPER QUADRANT COMPARISON:  CT abdomen  and pelvis dated September 04, 2022 FINDINGS: Gallbladder: Gallbladder wall thickening. Gallstones, largest measures 2.1 cm. No sonographic Murphy sign noted by sonographer. Common bile duct: Diameter: 4 mm Intrahepatic biliary ductal dilation. Liver: Mass of the right lobe of the liver measuring 9.5 x 13.1 x 11.8 cm. Increased parenchymal echogenicity. Portal vein is patent on color Doppler imaging with normal direction of blood flow towards the liver. Other: None. IMPRESSION: 1. Cholelithiasis with gallbladder wall thickening, findings can be seen in the setting of acute cholecystitis although sonographic Murphy sign is negative. Recommend clinical correlation. 2. Large mass of the right lobe of the liver. 3. Intrahepatic biliary ductal dilation, likely secondary to compression from large hepatic mass. 4. Hepatic steatosis. Electronically Signed   By: Allegra Lai M.D.   On: 09/11/2022 16:23   CT Abdomen Pelvis W Contrast  Result Date: 09/04/2022 CLINICAL DATA:  Nausea vomiting EXAM: CT ABDOMEN AND PELVIS WITH CONTRAST TECHNIQUE: Multidetector CT imaging of the abdomen and pelvis was performed using the standard protocol following bolus administration of intravenous contrast. RADIATION DOSE REDUCTION: This exam was performed according to the departmental dose-optimization program which includes automated exposure control, adjustment of the mA and/or kV according to patient size and/or use of iterative reconstruction technique. CONTRAST:  OMNIPAQUE IOHEXOL 300 MG/ML  SOLN COMPARISON:  CT 08/19/2022, 06/28/2022 FINDINGS: Lower chest: Lung bases demonstrate no acute airspace disease. Atelectasis at the lingula and lung bases. Normal cardiac size. Hepatobiliary: Large posterior right hepatic lobe mass. This measures approximately 10.1 x 7.7 cm on series 2, image 18, previously 10 x 7.5 cm. Slightly more inferior hypodense liver mass, series 2, image 24 measured as separate lesion on the March exam is probably  contiguous with the dominant adjacent lesion. Subcentimeter hypodensity in the left hepatic lobe on series 2, image 24 measures 6 mm, previously 8 mm. 1.8 cm  cyst in the inferior right hepatic lobe without significant change. Similar probable cyst adjacent to falciform ligament on series 2, image 24. Multiple gallstones. Possible mild gallbladder wall thickening. Mild biliary dilatation in the right hepatic lobe. Pancreas: Unremarkable. No pancreatic ductal dilatation or surrounding inflammatory changes. Spleen: 1.5 cm hypodense splenic lesion without significant change. Adrenals/Urinary Tract: Adrenal glands are normal. Kidneys show no hydronephrosis. The bladder is unremarkable Stomach/Bowel: Stomach is nonenlarged. No dilated small bowel. Interval right abdominal loop ileostomy. No distended small bowel on today's study. Minimal stranding within the abdominal wall at the site of ileostomy likely resolving postsurgical change. Circumferential ascending colon mass measuring 3 cm in craniocaudal length on coronal views. Colon is decompressed Vascular/Lymphatic: Nonaneurysmal aorta. Enlarged portal caval node measuring 19 mm, previously 19 mm. Right para colic lymph nodes measuring up to 8 mm, previously 8 mm. Distal aortocaval lymph node measuring 11 mm, previously 12 mm, series 2, image 46. Reproductive: Uterus and bilateral adnexa are unremarkable. Other: Negative for pelvic effusion or free air. Musculoskeletal: Mild sclerotic and lucent lesion in the L1 vertebral body. Possible superior endplate fracture at L1. IMPRESSION: 1. Interval right abdominal loop ileostomy. No evidence for small bowel obstruction or acute bowel wall thickening on this examination. Redemonstrated circumferential ascending colon mass. 2. Grossly stable size of dominant right hepatic lobe mass compared with most recent prior CT. Adjacent satellite lesion now appears to be contiguous with the dominant right hepatic lobe lesion. 3. Similar  enlarged portal caval, aortocaval and paracolic nodes. 4. Mild sclerotic lesion L1 vertebral body with possible superior endplate fracture. 5. Multiple gallstones. Possible mild gallbladder wall thickening. 6. Stable 15 mm hypodense splenic lesion. Electronically Signed   By: Jasmine Pang M.D.   On: 09/04/2022 19:30    PERFORMANCE STATUS (ECOG) : 2 - Symptomatic, <50% confined to bed  Review of Systems  Gastrointestinal:  Positive for abdominal pain.  Musculoskeletal:  Positive for arthralgias, back pain and gait problem.  Neurological:  Positive for light-headedness.  Psychiatric/Behavioral:  Positive for decreased concentration.        Difficulty differentiating real from non-real events. : Unless otherwise noted, a complete review of systems is negative.  Physical Exam: General: thin and ill-appearing, having difficulty maintaining posture in chair Cardiovascular: regular rate and rhythm Pulmonary: respirations even and unlabored Abdomen: soft, tenderness to right abdomen, ileostomy intact draining brownish-colored stool Extremities: no edema, no joint deformities Skin: no rashes, pale Neurological: lethargic/sedated, eyes rolling back/falling asleep during conversation, pin-point pupils, delayed responses  IMPRESSION: Ms. Coppock presents to this visit with close friend [name] who assists in her care. In a wheelchair. Somewhat somnolent. Is able to engage in discussions.   I introduced myself, Nikki NP, Maygan RN, and Palliative's role in collaboration with the oncology team. Concept of Palliative Care was introduced as specialized medical care for people and their families living with serious illness.  It focuses on providing relief from the symptoms and stress of a serious illness.  The goal is to improve quality of life for both the patient and the family. Values and goals of care important to patient and family were attempted to be elicited.   1.   Pain Ms. Water and friend  [name] report that medications were adjusted at oncology appointment on Friday 4/26. They appreciate some improvement in patient's level of pain; however, are concerned about her mentation. She is having trouble staying awake and difficulty differentiating real and non-real events. Friend reports patient talking about, "taking baseballs out  of the bathtub," "mice crawling in floor," and "using a chicken to clean out her ears." They report that Ms. Gillen is also having trouble walking. They are concerned about safety since patient is frequently alone at home.  Ms. Coan endorses pain 7 out of 10 at this visit. On an average day, it is constant and averages 8 out of 10 with brief periods increasing to "12 out of 10." Reports that pain was previous more centralized but is now "more spread out." Endorses sharp pain to lower back and hips, shoulders radiating toward elbows, and right side. Mornings are the worst but improve once sitting up on sofa. Heating pad helps but only for a short period of time. There are no activities that make the pain worse; severe pain occurs at rest.  We discussed at length pain levels and what would be a realistic expectations. Elnita and [name] shares realistically would love to see patient her happy and outgoing self with better pain control. She understands pain may never completely resolve and there will be times that some days are better than others, pain increases and or decreases. Our focus is to try to get her pain levels to a manageable place allowing her quality of life to be as good as it can. We discussed her current pain level of a 5-6 which she feels is comfortable/manageable compared to previous weeks of being "off the chart". I acknowledge focusing on pain level of 5-6 is a good starting goal allowing [place] a measurement tool to see how regimen is working.   Extensive review of current regimen. Dr. Myna Hidalgo recently made some adjustments as patient did not tolerate  Fentanyl patch well. She shares that she removed patch after 3 days due to "not liking how she felt". MS Contin was starting. She is tolerating well however has noticed some drowsiness. Education provided on the use of long-acting versus short-acting in addition to use of multiple medications at one time with sedation potential. Patient has been taking ativan, muscle relaxer, and breakthrough hydromorphone within time frames which could contribute to her lethargy. [name] has a chart were they have been documenting dosing times. I have reviewed at length. Education provided on regimen, frequency, and as needed use versus scheduled use.   Ms. Kreuz speaks to pain being sharp, stabbing, throbbing, worse when sedentary, and arthritic/bone pain. Education provided on use of mobic to target any inflammation. Could potentially consider burst dose of steroids however will defer for now as she seemed to have some response to use of Toradol. Patient and family verbalized understanding.   Reviewed patient's pain medications and adjustments made. Education provided regarding the timing of taking pain medications, especially not taking multiple sedating medications simultaneously. Please see plan below.  Complete physical, medication, medical, and psychosocial review completed.  Patient denies any use of illicit drugs or alcohol. Understands if ever positive this would be grounds for no longer receiving any further opioids. Extensive discussions and explanation of palliative's role in collaboration with his oncology team to assist in his pain and symptom management. Education provided pain contract and guidelines for ongoing support including terms for dismissal if contract is broken. She verbalized understanding. Pain contract completed.    We will continue to closely monitor and support in collaboration with Dr. Myna Hidalgo and his team. Patient and family knows to contact office as needed.   2.  Nausea/vomiting Ms.  Rennert reports that, in the past, she vomited 10 times per day. Reports that now she vomits  4 times per week. She appreciates the improvement and attributes it to the Scopolamine patch.   Reviewed patient's antiemetic medications and adjustments made. Currently taking ativan, Zyprexa, with availability of compazine. Education provided regarding the timing of taking antiemetic medications, especially not taking multiple sedating medications simultaneously. Advised given improvement to discontinue around the clock use of ativan at this time however with availability as needed. Goal is to decrease emesis episodes. Education also provided on taking medications on an empty stomach. Please see plan below.   3.  Decreased appetite Ms. Vullo reports that she likes to eat and that it is important to her. Appetite has improved since decrease in nausea and vomiting. Zyprexa and marijuana have been helpful. Patient reports taking 1/8 of a gummy. Given her ongoing nausea and weight loss due to poor nutrition regimen is providing relief. No changes recommended.  Megace just now ready at pharmacy - patient has plans to pick up today and begin taking. Will closely monitor.  Has started incorporating protein supplement shakes into diet such as putting them in her coffee. Educated regarding use of shakes in smoothies as well. We discussed foods and other ways to increase protein intake. Encouraged focusing on small frequent meals versus 3 large meals daily. Snacking when desired, eating foods high in calorie with no regards to food content. Would avoid high fatty/greasy foods in large portions as this could contribute to her nausea.   Guynell is also being followed by Dietician.   Current weight is 162lbs down from 171lbs on 4/9.    2.  Goals of Care  Ms. Kinder has three adult children. She lives with her daughter Darl Pikes who works during the day and is at home during the night. Lyda Kalata and boyfriend Jeannett Senior  check on patient intermittently throughout the day. While patient does not have documents in place as of yet, she requests that these individuals be involved in her care.  Ms. Talburt relates not being ready to make a decision about her code status at this time. She is, however, open to education and expresses wanting to get this completed in the near future.She wishes to discuss further with her family to make sure they are onboard with her wishes and understand. We discussed code status, artificial feeding, how aggressive she would wish for care to be in the setting of further decline. Quinlan shares she would not wish to have her life prolonged in the setting of no quality of life or ability to improve. At that point her desire would be for a natural death. Would not wish to live with long-term artificial feedings.   Education provided on advanced directives and importance. She understands it does not mean she cannot speak for herself or that care will be discontinued or limited. This is setting a plan for when those worsening time comes while continuing to remain hopeful for stability and the best for as long as God sees fit. She expresses appreciation. She would like information and has requested for advanced directive clinic referral. Patient acknowledges in the event of an emergency her wishes would be for her daughter, Ruweyda Kaas, friend Aris Georgia, and Significant Other, Megan Mans to assist in medical decisions.   Ms. Conlon current goal is to achieve better pain management, while maintaining as much alertness and physical function as possible. Continue to treat the treatable allowing her every opportunity to continue to thrive and enjoy life for as long as she can.   We discussed Ms. Crile current  illness and what it means in the larger context of her on-going co-morbidities. Natural disease trajectory and expectations were discussed. We discussed the importance of continued  conversation with family and their medical providers regarding overall plan of care and treatment options, ensuring decisions are within the context of the patients values and GOCs.  PLAN: Established therapeutic relationship. Education provided on palliative's role in collaboration with their Oncology team. For pain:  Continue taking MS Contin 30 mg every 12 hours. Change Dilaudid to 4 mg every 4 hours as needed for severe pain. Change Baclofen from scheduled dosing to as-needed dosing. Instructed to take twice daily as needed for muscle spasms. Start Mobic daily. Toradol injection on Tuesday 4/30 at Dr. Gustavo Lah office. For nausea/vomiting/appetite: Pick-up Megace today from pharmacy and begin. Continue taking Pepcid for dyspepsia. Continue Scopolamine patch. Continue Zyprexa at bedtime. Zofran and Compazine available as needed. Patient currently taking Ativan around-the-clock twice a day. Instructed to take Ativan only as needed if having more than 4 nausea/vomiting episodes in a day; no more than twice in a day. Education provided on MS Contin, Dilaudid, Baclofen, Zyprexa, and Ativan and their sedating effects. Instructed NOT to take these medications at the same time due to concerns for over-sedation and respiratory depression. Should take at minimum 2 hours apart if needed. Ongoing goals of care and symptom management support Pain contract completed. Copy provided.  Palliative will plan to call patient this Thursday 5/2. Follow-up visit scheduled for Thursday 5/9. Patient aware to call sooner for needs, questions, concerns.   Patient expressed understanding and was in agreement with this plan. She also understands that she can call the clinic at any time with any questions, concerns, or complaints.   Thank you for your referral and allowing Palliative to assist in Mrs. Desa Alkire's care.   Number and complexity of problems addressed: HIGH - 1 or more chronic illnesses with SEVERE  exacerbation, progression, or side effects of treatment - advanced cancer, pain. Any controlled substances utilized were prescribed in the context of palliative care.  I assessed patient with Marylene Land, NP Student. Agree with above findings.   Visit consisted of counseling and education dealing with the complex and emotionally intense issues of symptom management and palliative care in the setting of serious and potentially life-threatening illness.Greater than 50%  of this time was spent counseling and coordinating care related to the above assessment and plan.  Signed by: Katy Apo, RN MSN [facility] / NP Student   Willette Alma, AGPCNP-BC Palliative Medicine Team/[facility]   *Please note that this is a verbal dictation therefore any spelling or grammatical errors are due to the "Dragon Medical One" system interpretation.

## 2022-09-24 NOTE — Patient Instructions (Signed)
Thank you for allowing me to be a member of your Oncology care team. Today we discussed the following:  Continue taking MS Contin (morphine) every 12 hours  Hydromorphone Dilaudid ever 4 hours as needed for SEVERE PAIN.  Baclofen (muscle relaxer) as needed  Stop taking ativan around the clock twice a day. Only as needed if having more than 4 nausea/vomiting episodes in a day.  Will receive Toradol during visit on Tuesday @ Dr. Gustavo Lah office.  Continue Zyprexa at bedtime  DO NOT take any of these medications at the same time due to concerns of over sedation and respiratory depression. Should take at minimum 2 hours a part if needed.  Start Mobic daily

## 2022-09-25 ENCOUNTER — Encounter: Payer: Self-pay | Admitting: Hematology & Oncology

## 2022-09-25 ENCOUNTER — Inpatient Hospital Stay: Payer: Medicaid Other

## 2022-09-25 ENCOUNTER — Other Ambulatory Visit (HOSPITAL_BASED_OUTPATIENT_CLINIC_OR_DEPARTMENT_OTHER): Payer: Self-pay

## 2022-09-25 ENCOUNTER — Other Ambulatory Visit (HOSPITAL_COMMUNITY): Payer: Self-pay

## 2022-09-25 ENCOUNTER — Other Ambulatory Visit: Payer: Self-pay

## 2022-09-25 ENCOUNTER — Inpatient Hospital Stay (HOSPITAL_BASED_OUTPATIENT_CLINIC_OR_DEPARTMENT_OTHER): Payer: Medicaid Other | Admitting: Hematology & Oncology

## 2022-09-25 VITALS — BP 123/78 | HR 102 | Temp 98.3°F | Resp 16

## 2022-09-25 DIAGNOSIS — C189 Malignant neoplasm of colon, unspecified: Secondary | ICD-10-CM | POA: Diagnosis not present

## 2022-09-25 DIAGNOSIS — D509 Iron deficiency anemia, unspecified: Secondary | ICD-10-CM

## 2022-09-25 DIAGNOSIS — C787 Secondary malignant neoplasm of liver and intrahepatic bile duct: Secondary | ICD-10-CM

## 2022-09-25 DIAGNOSIS — Z515 Encounter for palliative care: Secondary | ICD-10-CM

## 2022-09-25 DIAGNOSIS — G893 Neoplasm related pain (acute) (chronic): Secondary | ICD-10-CM

## 2022-09-25 DIAGNOSIS — C19 Malignant neoplasm of rectosigmoid junction: Secondary | ICD-10-CM | POA: Diagnosis not present

## 2022-09-25 DIAGNOSIS — D649 Anemia, unspecified: Secondary | ICD-10-CM

## 2022-09-25 LAB — CBC WITH DIFFERENTIAL (CANCER CENTER ONLY)
Abs Immature Granulocytes: 0.01 10*3/uL (ref 0.00–0.07)
Basophils Absolute: 0 10*3/uL (ref 0.0–0.1)
Basophils Relative: 0 %
Eosinophils Absolute: 0.1 10*3/uL (ref 0.0–0.5)
Eosinophils Relative: 4 %
HCT: 37.7 % (ref 36.0–46.0)
Hemoglobin: 12 g/dL (ref 12.0–15.0)
Immature Granulocytes: 0 %
Lymphocytes Relative: 20 %
Lymphs Abs: 0.6 10*3/uL — ABNORMAL LOW (ref 0.7–4.0)
MCH: 29.4 pg (ref 26.0–34.0)
MCHC: 31.8 g/dL (ref 30.0–36.0)
MCV: 92.4 fL (ref 80.0–100.0)
Monocytes Absolute: 0.4 10*3/uL (ref 0.1–1.0)
Monocytes Relative: 14 %
Neutro Abs: 2 10*3/uL (ref 1.7–7.7)
Neutrophils Relative %: 62 %
Platelet Count: 138 10*3/uL — ABNORMAL LOW (ref 150–400)
RBC: 4.08 MIL/uL (ref 3.87–5.11)
RDW: 15.9 % — ABNORMAL HIGH (ref 11.5–15.5)
WBC Count: 3.1 10*3/uL — ABNORMAL LOW (ref 4.0–10.5)
nRBC: 0 % (ref 0.0–0.2)

## 2022-09-25 LAB — CMP (CANCER CENTER ONLY)
ALT: 61 U/L — ABNORMAL HIGH (ref 0–44)
AST: 159 U/L — ABNORMAL HIGH (ref 15–41)
Albumin: 3.2 g/dL — ABNORMAL LOW (ref 3.5–5.0)
Alkaline Phosphatase: 670 U/L — ABNORMAL HIGH (ref 38–126)
Anion gap: 12 (ref 5–15)
BUN: 5 mg/dL — ABNORMAL LOW (ref 6–20)
CO2: 26 mmol/L (ref 22–32)
Calcium: 8.4 mg/dL — ABNORMAL LOW (ref 8.9–10.3)
Chloride: 100 mmol/L (ref 98–111)
Creatinine: 0.58 mg/dL (ref 0.44–1.00)
GFR, Estimated: >60 mL/min (ref >60)
Glucose, Bld: 143 mg/dL — ABNORMAL HIGH (ref 70–99)
Potassium: 3.4 mmol/L — ABNORMAL LOW (ref 3.5–5.1)
Sodium: 138 mmol/L (ref 135–145)
Total Bilirubin: 2.9 mg/dL — ABNORMAL HIGH (ref 0.3–1.2)
Total Protein: 6.4 g/dL — ABNORMAL LOW (ref 6.5–8.1)

## 2022-09-25 LAB — PREALBUMIN: Prealbumin: 7 mg/dL — ABNORMAL LOW (ref 18–38)

## 2022-09-25 LAB — SAMPLE TO BLOOD BANK

## 2022-09-25 LAB — CEA (IN HOUSE-CHCC): CEA (CHCC-In House): 13.08 ng/mL — ABNORMAL HIGH (ref 0.00–5.00)

## 2022-09-25 MED ORDER — SODIUM CHLORIDE 0.9% FLUSH: 10.0000 mL | INTRAVENOUS | Status: DC | PRN

## 2022-09-25 MED ORDER — ZOLEDRONIC ACID 4 MG/100ML IV SOLN
4.0000 mg | Freq: Once | INTRAVENOUS | Status: AC
  Administered 2022-09-25: 4 mg via INTRAVENOUS
  Filled 2022-09-25: qty 100

## 2022-09-25 MED ORDER — KETOROLAC TROMETHAMINE 15 MG/ML IJ SOLN
30.0000 mg | Freq: Once | INTRAMUSCULAR | Status: AC
  Administered 2022-09-25: 30 mg via INTRAVENOUS
  Filled 2022-09-25: qty 2

## 2022-09-25 MED ORDER — FRUQUINTINIB 1 MG PO CAPS
3.0000 mg | ORAL_CAPSULE | Freq: Every day | ORAL | 4 refills | Status: DC
  Filled 2022-09-25: qty 63, 28d supply, fill #0

## 2022-09-25 MED ORDER — DOXYCYCLINE HYCLATE 100 MG PO TABS
100.0000 mg | ORAL_TABLET | Freq: Two times a day (BID) | ORAL | 0 refills | Status: DC
  Filled 2022-09-25: qty 20, 10d supply, fill #0

## 2022-09-25 MED ORDER — SODIUM CHLORIDE 0.9 % IV SOLN: INTRAVENOUS | Status: DC

## 2022-09-25 MED ORDER — HEPARIN SOD (PORK) LOCK FLUSH 100 UNIT/ML IV SOLN: 500.0000 [IU] | Freq: Once | INTRAVENOUS | Status: DC

## 2022-09-25 MED ORDER — FAMOTIDINE IN NACL 20-0.9 MG/50ML-% IV SOLN: 40.0000 mg | Freq: Once | INTRAVENOUS | Status: DC

## 2022-09-25 NOTE — Patient Instructions (Signed)

## 2022-09-25 NOTE — Progress Notes (Signed)
Hematology and Oncology Follow Up Visit  Analiyah Lechuga [id number] [birth year] 57 y.o. 09/25/2022   Principle Diagnosis:  Metastatic colorectal cancer-liver metastasis- BRAF (+)   Current Therapy:        Status post cycle 1 of chemotherapy with FOLFOXIRI/Avastin Encorafenib/Vectibix -- started on 06/19/2021, s/p cycle #6 -- d/c on 10/31/2021 Intrahepatic TACE -- 03/29/2022 FOLFOXIRI/Avastin-- s/p cycle  #5-- start on 02/19/2022, -oxaliplatin dropped on 04/03/2022 -- d/c on 07/03/2022 IV iron-Ferrlecit given on 05/10/2022  Lonsurf/Avastin -- s/p cycle #1 -- start on 07/07/2022 -DC on 08/15/2022 Zometa 4 mg IV every 3 months -next dose on 11/2022  XRT to L1 vertebral body --start on 07/10/2022 Fruquintinib 5 mg po q day (21/7) --  start on 04/`09/2022 --changed to 3 mg p.o. daily (21/7) on 09/25/2022               Interim History:  Ms. Desrosiers is here today for follow-up.  She she is doing a little bit better.  We actually saw her just 4 days ago.  She really looked rough.  She was having a lot of pain.  We now have her on MS Contin which is helping somewhat.  She is able to have less pain.  She not taking as much short acting Dilaudid.  She is more active overall.  She says she does not have any problems with nausea or vomiting.  Her colostomy is working.  She comes in with a daughter and her best friend.  She seems to be eating a little bit better.  She still is quite weak.  She is still does have a tough time getting around.  She has had no fever.  There has been no bleeding.  She has had no rashes.  There is a little bit of a rash on her face.  We will try her on some doxycycline for this.  I just want her quality of life to be as good as possible.  I know that this has been difficult on her.  They cannot really weigh her today.  I believe that they had met with Palliative Care yesterday.  They have really helped out with respect to her pain management.  Overall, I would have said that her  performance status is probably ECOG 2-3.     Medications:  Allergies as of 09/25/2022       Reactions   Augmentin [amoxicillin-pot Clavulanate] Diarrhea, Other (See Comments)   Extreme diarrhea, abdominal pain.   Irinotecan Other (See Comments)   Flushing, tingling, visual disturbance   Metronidazole Diarrhea, Other (See Comments)   Vancomycin Rash, Other (See Comments)   "Red Man Syndrome"   Latex Rash   Tape Rash   Wound Dressing Adhesive Rash        Medication List        Accurate as of September 25, 2022 11:49 AM. If you have any questions, ask your nurse or doctor.          STOP taking these medications    cyclobenzaprine 10 MG tablet Commonly known as: FLEXERIL Stopped by: Josph Macho, MD   [place] 5 MG capsule Generic drug: fruquintinib Stopped by: Josph Macho, MD   Iron 325 (65 Fe) MG Tabs Stopped by: Josph Macho, MD   metoCLOPramide 10 MG tablet Commonly known as: REGLAN Stopped by: Josph Macho, MD       TAKE these medications    baclofen 10 MG tablet Commonly known as: LIORESAL Take 1 tablet (10  mg total) by mouth 3 (three) times daily.   famotidine 40 MG tablet Commonly known as: PEPCID Take 1 tablet (40 mg total) by mouth 2 (two) times daily.   HYDROmorphone 4 MG tablet Commonly known as: Dilaudid Take 1 tablet (4 mg total) by mouth every 2 (two) hours as needed for severe pain. What changed:  when to take this additional instructions   loperamide 2 MG tablet Commonly known as: IMODIUM A-D Take 2 mg by mouth 4 (four) times daily as needed for diarrhea or loose stools.   LORazepam 0.5 MG tablet Commonly known as: ATIVAN Place 1 tablet (0.5 mg total) under the tongue every 4 (four) hours as needed for anxiety.   megestrol 40 MG/ML suspension Commonly known as: MEGACE Take 10 mLs (400 mg total) by mouth 2 (two) times daily.   meloxicam 15 MG tablet Commonly known as: MOBIC Take 1 tablet (15 mg total) by mouth  daily.   morphine 30 MG 12 hr tablet Commonly known as: MS CONTIN Take 1 tablet (30 mg total) by mouth every 12 (twelve) hours.   OLANZapine 10 MG tablet Commonly known as: ZYPREXA Take 1 tablet (10 mg total) by mouth at bedtime.   ondansetron 4 MG tablet Commonly known as: ZOFRAN Take 1 tablet (4 mg total) by mouth every 6 (six) hours.   prochlorperazine 10 MG tablet Commonly known as: COMPAZINE Take 1 tablet (10 mg total) by mouth every 6 (six) hours as needed for nausea or vomiting.   Transderm-Scop 1 MG/3DAYS Generic drug: scopolamine Place one patch behind the ear every three days.        Allergies:  Allergies  Allergen Reactions   Augmentin [Amoxicillin-Pot Clavulanate] Diarrhea and Other (See Comments)    Extreme diarrhea, abdominal pain.   Irinotecan Other (See Comments)    Flushing, tingling, visual disturbance   Metronidazole Diarrhea and Other (See Comments)   Vancomycin Rash and Other (See Comments)    "Red Man Syndrome"   Latex Rash   Tape Rash   Wound Dressing Adhesive Rash    Past Medical History, Surgical history, Social history, and Family History were reviewed and updated.  Review of Systems: Review of Systems  Constitutional: Negative.   HENT: Negative.    Eyes: Negative.   Respiratory: Negative.    Cardiovascular: Negative.   Gastrointestinal: Negative.        Constipation alternating with diarrhea.   Genitourinary: Negative.   Musculoskeletal: Negative.   Skin: Negative.   Neurological: Negative.   Endo/Heme/Allergies: Negative.   Psychiatric/Behavioral: Negative.       Physical Exam: Her vital signs show temperature of 97 6.  Pulse 106.  Blood pressure 111/82.       Wt Readings from Last 3 Encounters:  09/24/22 164 lb 14.4 oz (74.8 kg)  09/21/22 162 lb (73.5 kg)  09/04/22 171 lb 1.2 oz (77.6 kg)    Physical Exam Vitals reviewed.  HENT:     Head: Normocephalic and atraumatic.  Eyes:     Pupils: Pupils are equal, round,  and reactive to light.  Cardiovascular:     Rate and Rhythm: Normal rate and regular rhythm.     Heart sounds: Normal heart sounds.  Pulmonary:     Effort: Pulmonary effort is normal.     Breath sounds: Normal breath sounds.  Abdominal:     General: Bowel sounds are normal.     Palpations: Abdomen is soft.     Comments: Abdominal exam is soft.  She  has a colostomy in the right lower quadrant.  She has no fluid wave.  There is no guarding or rebound tenderness.  There is no hepatomegaly.  Musculoskeletal:        General: No tenderness or deformity. Normal range of motion.     Cervical back: Normal range of motion.  Lymphadenopathy:     Cervical: No cervical adenopathy.  Skin:    General: Skin is warm and dry.     Findings: No erythema or rash.  Neurological:     Mental Status: She is alert and oriented to person, place, and time.  Psychiatric:        Behavior: Behavior normal.        Thought Content: Thought content normal.        Judgment: Judgment normal.     Lab Results  Component Value Date   WBC 3.1 (L) 09/25/2022   HGB 12.0 09/25/2022   HCT 37.7 09/25/2022   MCV 92.4 09/25/2022   PLT 138 (L) 09/25/2022   Lab Results  Component Value Date   FERRITIN 312 (H) 09/21/2022   IRON 58 09/21/2022   TIBC 286 09/21/2022   UIBC 228 09/21/2022   IRONPCTSAT 20 09/21/2022   Lab Results  Component Value Date   RETICCTPCT 1.6 08/07/2022   RBC 4.08 09/25/2022   No results found for: "KPAFRELGTCHN", "LAMBDASER", "KAPLAMBRATIO" No results found for: "IGGSERUM", "IGA", "IGMSERUM" No results found for: "TOTALPROTELP", "ALBUMINELP", "A1GS", "A2GS", "BETS", "BETA2SER", "GAMS", "MSPIKE", "SPEI"   Chemistry      Component Value Date/Time   NA 138 09/25/2022 1043   K 3.4 (L) 09/25/2022 1043   CL 100 09/25/2022 1043   CO2 26 09/25/2022 1043   BUN 5 (L) 09/25/2022 1043   CREATININE 0.58 09/25/2022 1043   CREATININE 0.56 03/23/2021 0000      Component Value Date/Time   CALCIUM  8.4 (L) 09/25/2022 1043   ALKPHOS 670 (H) 09/25/2022 1043   AST 159 (H) 09/25/2022 1043   ALT 61 (H) 09/25/2022 1043   BILITOT 2.9 (H) 09/25/2022 1043       Impression and Plan: Ms. Lundeen is a very charming  57 yo caucasian female with metastatic colon cancer.  She had disease confined to her liver.  However, she then began to have progression.  She had disease in her L1 vertebral body.  She has received radiation therapy to this.  We had her on Lonsurf and then she could not tolerate this at all.  She has been hospitalized.  She then developed a bowel obstruction requiring emergency surgery for this.  I realize that her bilirubin is going up a little bit.  However, I just do not see that we have any other option to try to help her out outside of the fruquintinib.  We will decrease her dose to 3 mg a day for 21 days on 7 days off.  I think that this is going to be our final chance of trying to help her out.  I know that she is trying her best. She wants to keep doing what she can do. She has a new grandson that she would like to be able to see.  I think that we need probably need to get her back in a couple of weeks.  I think at that point, we will clearly know which way things are going with her.  In the office today, we gave her IV fluids and some supplemental medications.  I think she got her  Zometa.    We will plan to have her come back in a couple weeks.  We will see how she is doing at that point.  I think that if she is not improving, we probably will have to consider her for Hospice.  Josph Macho, MD 4/30/202411:49 AM

## 2022-09-25 NOTE — Patient Instructions (Signed)
Dehydration, Adult Dehydration is a condition in which there is not enough water or other fluids in the body. This happens when a person loses more fluids than they take in. Important organs cannot work right without the right amount of fluids. Any loss of fluids from the body can cause dehydration. Dehydration can be mild, worse, or very bad. It should be treated right away to keep it from getting very bad. What are the causes? Conditions that cause loss of water in the body. They include: Watery poop (diarrhea). Vomiting. Sweating a lot. Fever. Infection. Peeing (urinating) a lot. Not drinking enough fluids. Certain medicines, such as medicines that take extra fluid out of the body (diuretics). Lack of safe drinking water. Not being able to get enough water and food. What increases the risk? Having a long-term (chronic) illness that has not been treated the right way, such as: Diabetes. Heart disease. Kidney disease. Being 65 years of age or older. Having a disability. Living in a place that is high above the ground or sea (high in altitude). The thinner, drier air causes more fluid loss. Doing exercises that put stress on your body for a long time. Being active when in hot places. What are the signs or symptoms? Symptoms of dehydration depend on how bad it is. Mild or worse dehydration Thirst. Dry lips or dry mouth. Feeling dizzy or light-headed. Muscle cramps. Passing little pee or dark pee. Pee may be the color of tea. Headache. Very bad dehydration Changes in skin. Skin may: Be cold to the touch (clammy). Be blotchy or pale. Not go back to normal right after you pinch it and let it go. Little or no tears, pee, or sweat. Fast breathing. Low blood pressure. Weak pulse. Pulse that is more than 100 beats a minute when you are sitting still. Other changes, such as: Feeling very thirsty. Eyes that look hollow (sunken). Cold hands and feet. Being confused. Being very  tired (lethargic) or having trouble waking from sleep. Losing weight. Loss of consciousness. How is this treated? Treatment for this condition depends on how bad your dehydration is. Treatment should start right away. Do not wait until your condition gets very bad. Very bad dehydration is an emergency. You will need to go to a hospital. Mild or worse dehydration can be treated at home. You may be asked to: Drink more fluids. Drink an oral rehydration solution (ORS). This drink gives you the right amount of fluids, salts, and minerals (electrolytes). Very bad dehydration can be treated: With fluids through an IV tube. By correcting low levels of electrolytes in the body. By treating the problem that caused your dehydration. Follow these instructions at home: Oral rehydration solution If told by your doctor, drink an ORS: Make an ORS. Use instructions on the package. Start by drinking small amounts, about  cup (120 mL) every 5-10 minutes. Slowly drink more until you have had the amount that your doctor said to have.  Eating and drinking  Drink enough clear fluid to keep your pee pale yellow. If you were told to drink an ORS, finish the ORS first. Then, start slowly drinking other clear fluids. Drink fluids such as: Water. Do not drink only water. Doing that can make the salt (sodium) level in your body get too low. Water from ice chips you suck on. Fruit juice that you have added water to (diluted). Low-calorie sports drinks. Eat foods that have the right amounts of salts and minerals, such as bananas, oranges, potatoes,   tomatoes, or spinach. Do not drink alcohol. Avoid drinks that have caffeine or sugar. These include:: High-calorie sports drinks. Fruit juice that you did not add water to. Soda. Coffee or energy drinks. Avoid foods that are greasy or have a lot of fat or sugar. General instructions Take over-the-counter and prescription medicines only as told by your doctor. Do  not take sodium tablets. Doing that can make the salt level in your body get too high. Return to your normal activities as told by your doctor. Ask your doctor what activities are safe for you. Keep all follow-up visits. Your doctor may check and change your treatment. Contact a doctor if: You have pain in your belly (abdomen) and the pain: Gets worse. Stays in one place. You have a rash. You have a stiff neck. You get angry or annoyed more easily than normal. You are more tired or have a harder time waking than normal. You feel weak or dizzy. You feel very thirsty. Get help right away if: You have any symptoms of very bad dehydration. You vomit every time you eat or drink. Your vomiting gets worse, does not go away, or you vomit blood or green stuff. You are getting treatment, but symptoms are getting worse. You have a fever. You have a very bad headache. You have: Diarrhea that gets worse or does not go away. Blood in your poop (stool). This may cause poop to look black and tarry. No pee in 6-8 hours. Only a small amount of pee in 6-8 hours, and the pee is very dark. You have trouble breathing. These symptoms may be an emergency. Get help right away. Call 911. Do not wait to see if the symptoms will go away. Do not drive yourself to the hospital. This information is not intended to replace advice given to you by your health care provider. Make sure you discuss any questions you have with your health care provider. Document Revised: 12/11/2021 Document Reviewed: 12/11/2021 Elsevier Patient Education  2023 Elsevier Inc.  

## 2022-09-26 ENCOUNTER — Encounter: Payer: Self-pay | Admitting: Family

## 2022-09-26 ENCOUNTER — Other Ambulatory Visit: Payer: Self-pay

## 2022-09-26 ENCOUNTER — Other Ambulatory Visit (HOSPITAL_COMMUNITY): Payer: Self-pay

## 2022-09-26 ENCOUNTER — Other Ambulatory Visit (HOSPITAL_BASED_OUTPATIENT_CLINIC_OR_DEPARTMENT_OTHER): Payer: Self-pay

## 2022-09-26 ENCOUNTER — Inpatient Hospital Stay: Payer: Medicaid Other | Attending: Hematology & Oncology | Admitting: Dietician

## 2022-09-26 ENCOUNTER — Encounter: Payer: Self-pay | Admitting: *Deleted

## 2022-09-26 NOTE — Progress Notes (Signed)
Nutrition Follow Up:   Called patient on home/cell phone to monitor PO intake and NIS.  She reports nausea much better controlled, no vomiting lately.  Eating small frequent feeds.  She asked about eating popcorn with ostomy, cautioned against but offered flavored puffed rice and ice cakes as crunchy alternative. She did get some ONS samples from cancer center and some Pedialyte samples to try.  She states she's been able to tolerate Ensure plus in her coffee.     Medications: taking "what they gave me" for nausea and it's helping now.   Labs: 09/25/22  K+3.4     Anthropometrics: weight loss continues, little fluctuation and "too weak to be weighed on 4/30   Height: 61" Weight:  09/24/22  164.8# 09/21/22  162# 09/04/22  171# 08/28/22  171#     08/07/22  178# 08/13/22  174# 07/26/22  178.3 07/17/22  188# UBW: 185# BMI: 31.16     Estimated Energy Needs   Kcals: 2300-2700 Protein: 94-117 Fluid: 3 L   NUTRITION DIAGNOSIS: Inadequate PO intake for increased needs. continues     INTERVENTION:  Encouraged small frequent bland foods.  Encouraged use of 350 as meal replacement or between meals.  She was not give Ensure clear samples to use between meals Encouraged use of Pedialyte to help boost K+  MONITORING, EVALUATION, GOAL: weight, PO intake, Nutrition Impact Symptoms, labs Goal is weight maintenance or slow gain (2-5#).   Next Visit: Remote after MD visit next week   Gennaro Africa, RDN, LDN Registered Dietitian, [facility] Part Time Remote (Usual office hours: Tuesday-Thursday) Cell: [phone]

## 2022-09-26 NOTE — Progress Notes (Unsigned)
Palliative Medicine [facility]  [phone] [phone]   Name: Rebecca Bullock Date: 09/26/2022 MRN: [id number]  DOB: June 02, 1965  Patient Care Team: Nolene Ebbs as PCP - General (Family Medicine) Josph Macho, MD as Medical Oncologist (Oncology)   I connected with Rebecca Bullock on 09/26/22 at 12:30 PM EDT by phone and verified that I am speaking with the correct person using two identifiers.   I discussed the limitations, risks, security and privacy concerns of performing an evaluation and management service by telemedicine and the availability of in-person appointments. I also discussed with the patient that there may be a patient responsible charge related to this service. The patient expressed understanding and agreed to proceed.   Other persons participating in the visit and their role in the encounter: n/a   Patient's location: home  Provider's location: [facility]   Chief Complaint: f/u of symptom management   INTERVAL HISTORY: Rebecca Bullock is a 57 y.o. female with oncologic medical history including colon cancer (04/2021) with metastatic disease to liver and bone. Patient underwent a diverting loop ileostomy in march 2024. Palliative ask to see for symptom and pain management and goals of care   SOCIAL HISTORY:     reports that she has never smoked. She has never used smokeless tobacco. She reports that she does not currently use alcohol. She reports that she does not currently use drugs.  ADVANCE DIRECTIVES:  None on file  CODE STATUS: Full code  PAST MEDICAL HISTORY: Past Medical History:  Diagnosis Date   Colon cancer metastasized to liver (HCC) 05/05/2021   Family history of lung cancer    Family history of pancreatic cancer    Goals of care, counseling/discussion 05/05/2021   History of radiation therapy    Lumbar Spine- 07/12/22-07/25/22- Dr. Antony Blackbird    ALLERGIES:  is allergic to augmentin [amoxicillin-pot  clavulanate], irinotecan, metronidazole, vancomycin, latex, tape, and wound dressing adhesive.  MEDICATIONS:  Current Outpatient Medications  Medication Sig Dispense Refill   baclofen (LIORESAL) 10 MG tablet Take 1 tablet (10 mg total) by mouth 3 (three) times daily. 60 each 2   doxycycline (VIBRA-TABS) 100 MG tablet Take 1 tablet (100 mg total) by mouth 2 (two) times daily. 20 tablet 0   famotidine (PEPCID) 40 MG tablet Take 1 tablet (40 mg total) by mouth 2 (two) times daily. 60 tablet 4   fruquintinib (FRUZAQLA) 1 MG capsule Take 3 capsules (3 mg total) by mouth daily. Take for 21 days on, then off for 7 days. Repeat every 28 days. 63 capsule 4   HYDROmorphone (DILAUDID) 4 MG tablet Take 1 tablet (4 mg total) by mouth every 2 (two) hours as needed for severe pain. (Patient taking differently: Take 4 mg by mouth every 4 (four) hours as needed for severe pain. Per Palliative Care: Take every 4-6 hour prn.) 160 tablet 0   loperamide (IMODIUM A-D) 2 MG tablet Take 2 mg by mouth 4 (four) times daily as needed for diarrhea or loose stools. (Patient not taking: Reported on 08/28/2022)     LORazepam (ATIVAN) 0.5 MG tablet Place 1 tablet (0.5 mg total) under the tongue every 4 (four) hours as needed for anxiety. (Patient not taking: Reported on 09/25/2022) 60 tablet 0   megestrol (MEGACE) 400 MG/10ML suspension Take 10 mLs (400 mg total) by mouth 2 (two) times daily. 960 mL 2   meloxicam (MOBIC) 15 MG tablet Take 1 tablet (15 mg total) by mouth  daily. 30 tablet 0   morphine (MS CONTIN) 30 MG 12 hr tablet Take 1 tablet (30 mg total) by mouth every 12 (twelve) hours. 60 tablet 0   OLANZapine (ZYPREXA) 10 MG tablet Take 1 tablet (10 mg total) by mouth at bedtime. 30 tablet 3   ondansetron (ZOFRAN) 4 MG tablet Take 1 tablet (4 mg total) by mouth every 6 (six) hours. (Patient not taking: Reported on 09/25/2022) 12 tablet 0   prochlorperazine (COMPAZINE) 10 MG tablet Take 1 tablet (10 mg total) by mouth every 6  (six) hours as needed for nausea or vomiting. (Patient not taking: Reported on 08/28/2022) 60 tablet 0   scopolamine (TRANSDERM-SCOP) 1 MG/3DAYS Place one patch behind the ear every three days. 10 patch 3   No current facility-administered medications for this visit.   Facility-Administered Medications Ordered in Other Visits  Medication Dose Route Frequency Provider Last Rate Last Admin   atropine 1 MG/ML injection            heparin lock flush 100 unit/mL  500 Units Intravenous Once Ennever, Rose Phi, MD       HYDROmorphone (DILAUDID) 4 MG/ML injection            ketorolac (TORADOL) 15 MG/ML injection            LORazepam (ATIVAN) 2 MG/ML injection            sodium chloride flush (NS) 0.9 % injection 10 mL  10 mL Intracatheter Once PRN Erenest Blank, NP       sodium chloride flush (NS) 0.9 % injection 10 mL  10 mL Intravenous PRN Josph Macho, MD   10 mL at 10/31/21 1258    VITAL SIGNS: There were no vitals taken for this visit. There were no vitals filed for this visit.  Estimated body mass index is 31.16 kg/m as calculated from the following:   Height as of 09/21/22: 5\' 1"  (1.549 m).   Weight as of 09/24/22: 164 lb 14.4 oz (74.8 kg).   PERFORMANCE STATUS (ECOG) : {CHL ONC ECOG [id number]   Physical Exam General: NAD Cardiovascular: regular rate and rhythm Pulmonary: clear ant fields Abdomen: soft, nontender, + bowel sounds Extremities: no edema, no joint deformities Skin: no rashes Neurological:   IMPRESSION: 1.   Pain Rebecca Bullock and friend Rebecca Bullock report that medications were adjusted at oncology appointment on Friday 4/26. They appreciate some improvement in patient's level of pain; however, are concerned about her mentation. She is having trouble staying awake and difficulty differentiating real and non-real events. Friend reports patient talking about, "taking baseballs out of the bathtub," "mice crawling in floor," and "using a chicken to clean out her ears."  They report that Rebecca Bullock is also having trouble walking. They are concerned about safety since patient is frequently alone at home.   Rebecca Bullock endorses pain 7 out of 10 at this visit. On an average day, it is constant and averages 8 out of 10 with brief periods increasing to "12 out of 10." Reports that pain was previous more centralized but is now "more spread out." Endorses sharp pain to lower back and hips, shoulders radiating toward elbows, and right side. Mornings are the worst but improve once sitting up on sofa. Heating pad helps but only for a short period of time. There are no activities that make the pain worse; severe pain occurs at rest.   Reviewed patient's pain medications and adjustments made. Education provided regarding the timing  of taking pain medications, especially not taking multiple sedating medications simultaneously. Please see plan below.     2.  Nausea/vomiting.    3.  Decreased appetite   4.  Goals of Care   4/29- Ms. Marsan has three adult children. She lives with her daughter Darl Pikes who works during the day and is at home during the night. Lyda Kalata and boyfriend Jeannett Senior check on patient intermittently throughout the day. While patient does not have documents in place as of yet, she requests that these individuals be involved in her care.   Ms. Keltz relates not being ready to make a decision about her code status at this time. She is, however, open to education and expresses wanting to get this completed in the near future.   Ms. Roddy current goal is to achieve better pain management, while maintaining as much alertness and physical function as possible.  We discussed Her current illness and what it means in the larger context of Her on-going co-morbidities. Natural disease trajectory and expectations were discussed.  I discussed the importance of continued conversation with family and their medical providers regarding overall plan of care and  treatment options, ensuring decisions are within the context of the patients values and GOCs.  PLAN: Established therapeutic relationship. Education provided on palliative's role in collaboration with their Oncology team. For pain:  Continue taking MS Contin 30 mg every 12 hours. Change Dilaudid to 4 mg every 4 hours as needed for severe pain. Change Baclofen from scheduled dosing to as-needed dosing. Instructed to take twice daily as needed for muscle spasms. Start Mobic daily. Toradol injection on Tuesday 4/30 at Dr. Gustavo Lah office. For nausea/vomiting/appetite: Pick-up Megace today from pharmacy and begin. Continue taking Pepcid for dyspepsia. Continue Scopolamine patch. Continue Zyprexa at bedtime. Zofran and Compazine available as needed. Patient currently taking Ativan around-the-clock twice a day. Instructed to take Ativan only as needed if having more than 4 nausea/vomiting episodes in a day; no more than twice in a day. Education provided on MS Contin, Dilaudid, Baclofen, Zyprexa, and Ativan and their sedating effects. Instructed NOT to take these medications at the same time due to concerns for over-sedation and respiratory depression. Should take at minimum 2 hours apart if needed. Ongoing goals of care and symptom management support Pain contract completed. Copy provided.  Palliative will plan to call patient this Thursday 5/2. Follow-up visit scheduled for Thursday 5/9. Patient aware to call sooner for needs, questions, concerns.   Patient expressed understanding and was in agreement with this plan. She also understands that She can call the clinic at any time with any questions, concerns, or complaints.   Any controlled substances utilized were prescribed in the context of palliative care. PDMP has been reviewed.    Visit consisted of counseling and education dealing with the complex and emotionally intense issues of symptom management and palliative care in the setting of  serious and potentially life-threatening illness.Greater than 50%  of this time was spent counseling and coordinating care related to the above assessment and plan.  Willette Alma, AGPCNP-BC  Palliative Medicine Team/[facility]  *Please note that this is a verbal dictation therefore any spelling or grammatical errors are due to the "Dragon Medical One" system interpretation.

## 2022-09-27 ENCOUNTER — Encounter: Payer: Self-pay | Admitting: Nurse Practitioner

## 2022-09-27 ENCOUNTER — Telehealth: Payer: Self-pay | Admitting: *Deleted

## 2022-09-27 ENCOUNTER — Inpatient Hospital Stay (HOSPITAL_BASED_OUTPATIENT_CLINIC_OR_DEPARTMENT_OTHER): Payer: Medicaid Other | Admitting: Nurse Practitioner

## 2022-09-27 ENCOUNTER — Inpatient Hospital Stay: Payer: Medicaid Other

## 2022-09-27 DIAGNOSIS — R530 Neoplastic (malignant) related fatigue: Secondary | ICD-10-CM

## 2022-09-27 DIAGNOSIS — Z515 Encounter for palliative care: Secondary | ICD-10-CM | POA: Diagnosis not present

## 2022-09-27 DIAGNOSIS — R110 Nausea: Secondary | ICD-10-CM

## 2022-09-27 DIAGNOSIS — C189 Malignant neoplasm of colon, unspecified: Secondary | ICD-10-CM | POA: Diagnosis not present

## 2022-09-27 DIAGNOSIS — C787 Secondary malignant neoplasm of liver and intrahepatic bile duct: Secondary | ICD-10-CM

## 2022-09-27 DIAGNOSIS — G893 Neoplasm related pain (acute) (chronic): Secondary | ICD-10-CM

## 2022-09-27 DIAGNOSIS — R630 Anorexia: Secondary | ICD-10-CM

## 2022-09-27 NOTE — Progress Notes (Signed)
[facility] CSW Progress Note  Clinical Social Worker contacted patient by phone to assess needs per the request of Palliative Care.  CSW last met with patient in 11/23.  She stated she and her daughter are living together and that it is going well.  She continues to utilize her church for food and support.  CSW to leave patient's last [employer] gift card with Hospital doctor at the [facility] next Wednesday, 5/8, for patient.  She expressed no other needs.    Dorothey Baseman, LCSW Clinical Social Worker [facility]    Patient is participating in a Managed Medicaid Plan:  Yes

## 2022-09-27 NOTE — Telephone Encounter (Signed)
Message received from patient to inform Dr. Myna Hidalgo that she is going out of town this weekend and would like to know if she should start the Fruquintinib today or when she comes back on Monday.  Dr. Myna Hidalgo notified.  Call placed back to patient and patient instructed per order of Dr. Myna Hidalgo to restart the Fruquintinib at 3 mg daily on Monday, 10/01/22 once she gets back home from her trip.  Teach back done.  Pt states that she is "feeling good" this morning and has no other questions at this time.

## 2022-09-28 ENCOUNTER — Ambulatory Visit (HOSPITAL_COMMUNITY): Payer: Medicaid Other | Admitting: Nurse Practitioner

## 2022-10-01 ENCOUNTER — Inpatient Hospital Stay: Payer: Medicaid Other

## 2022-10-01 ENCOUNTER — Other Ambulatory Visit: Payer: Self-pay | Admitting: Hematology & Oncology

## 2022-10-01 ENCOUNTER — Telehealth: Payer: Self-pay

## 2022-10-01 ENCOUNTER — Other Ambulatory Visit: Payer: Self-pay

## 2022-10-01 ENCOUNTER — Ambulatory Visit (HOSPITAL_COMMUNITY)
Admission: RE | Admit: 2022-10-01 | Discharge: 2022-10-01 | Disposition: A | Discharge status: Home / Self Care | Payer: Medicaid Other | Source: Ambulatory Visit | Attending: Hematology & Oncology | Admitting: Hematology & Oncology

## 2022-10-01 ENCOUNTER — Inpatient Hospital Stay: Payer: Medicaid Other | Attending: Hematology & Oncology

## 2022-10-01 ENCOUNTER — Inpatient Hospital Stay (HOSPITAL_BASED_OUTPATIENT_CLINIC_OR_DEPARTMENT_OTHER): Payer: Medicaid Other | Admitting: Hematology & Oncology

## 2022-10-01 VITALS — BP 118/50 | HR 67

## 2022-10-01 VITALS — BP 131/70 | HR 76 | Temp 97.6°F | Resp 16 | Ht 61.0 in | Wt 166.0 lb

## 2022-10-01 DIAGNOSIS — C189 Malignant neoplasm of colon, unspecified: Secondary | ICD-10-CM | POA: Insufficient documentation

## 2022-10-01 DIAGNOSIS — Z923 Personal history of irradiation: Secondary | ICD-10-CM | POA: Insufficient documentation

## 2022-10-01 DIAGNOSIS — Z452 Encounter for adjustment and management of vascular access device: Secondary | ICD-10-CM | POA: Insufficient documentation

## 2022-10-01 DIAGNOSIS — Z5111 Encounter for antineoplastic chemotherapy: Secondary | ICD-10-CM | POA: Insufficient documentation

## 2022-10-01 DIAGNOSIS — C7951 Secondary malignant neoplasm of bone: Secondary | ICD-10-CM | POA: Diagnosis not present

## 2022-10-01 DIAGNOSIS — C787 Secondary malignant neoplasm of liver and intrahepatic bile duct: Secondary | ICD-10-CM

## 2022-10-01 DIAGNOSIS — C182 Malignant neoplasm of ascending colon: Secondary | ICD-10-CM | POA: Diagnosis present

## 2022-10-01 DIAGNOSIS — Z5112 Encounter for antineoplastic immunotherapy: Secondary | ICD-10-CM | POA: Insufficient documentation

## 2022-10-01 DIAGNOSIS — K831 Obstruction of bile duct: Secondary | ICD-10-CM | POA: Insufficient documentation

## 2022-10-01 DIAGNOSIS — R110 Nausea: Secondary | ICD-10-CM

## 2022-10-01 LAB — CBC WITH DIFFERENTIAL (CANCER CENTER ONLY)
Abs Immature Granulocytes: 0.01 10*3/uL (ref 0.00–0.07)
Basophils Absolute: 0 10*3/uL (ref 0.0–0.1)
Basophils Relative: 1 %
Eosinophils Absolute: 0.1 10*3/uL (ref 0.0–0.5)
Eosinophils Relative: 3 %
HCT: 33.2 % — ABNORMAL LOW (ref 36.0–46.0)
Hemoglobin: 10.6 g/dL — ABNORMAL LOW (ref 12.0–15.0)
Immature Granulocytes: 0 %
Lymphocytes Relative: 21 %
Lymphs Abs: 0.8 10*3/uL (ref 0.7–4.0)
MCH: 29.4 pg (ref 26.0–34.0)
MCHC: 31.9 g/dL (ref 30.0–36.0)
MCV: 92.2 fL (ref 80.0–100.0)
Monocytes Absolute: 0.5 10*3/uL (ref 0.1–1.0)
Monocytes Relative: 14 %
Neutro Abs: 2.2 10*3/uL (ref 1.7–7.7)
Neutrophils Relative %: 61 %
Platelet Count: 184 10*3/uL (ref 150–400)
RBC: 3.6 MIL/uL — ABNORMAL LOW (ref 3.87–5.11)
RDW: 18.1 % — ABNORMAL HIGH (ref 11.5–15.5)
WBC Count: 3.6 10*3/uL — ABNORMAL LOW (ref 4.0–10.5)
nRBC: 0 % (ref 0.0–0.2)

## 2022-10-01 LAB — CMP (CANCER CENTER ONLY)
ALT: 59 U/L — ABNORMAL HIGH (ref 0–44)
AST: 110 U/L — ABNORMAL HIGH (ref 15–41)
Albumin: 3.4 g/dL — ABNORMAL LOW (ref 3.5–5.0)
Alkaline Phosphatase: 759 U/L — ABNORMAL HIGH (ref 38–126)
Anion gap: 10 (ref 5–15)
BUN: 7 mg/dL (ref 6–20)
CO2: 24 mmol/L (ref 22–32)
Calcium: 8.6 mg/dL — ABNORMAL LOW (ref 8.9–10.3)
Chloride: 104 mmol/L (ref 98–111)
Creatinine: 0.49 mg/dL (ref 0.44–1.00)
GFR, Estimated: >60 mL/min (ref >60)
Glucose, Bld: 149 mg/dL — ABNORMAL HIGH (ref 70–99)
Potassium: 3 mmol/L — ABNORMAL LOW (ref 3.5–5.1)
Sodium: 138 mmol/L (ref 135–145)
Total Bilirubin: 6.9 mg/dL (ref 0.3–1.2)
Total Protein: 6.8 g/dL (ref 6.5–8.1)

## 2022-10-01 LAB — SAMPLE TO BLOOD BANK

## 2022-10-01 MED ORDER — HEPARIN SOD (PORK) LOCK FLUSH 100 UNIT/ML IV SOLN
500.0000 [IU] | Freq: Once | INTRAVENOUS | Status: AC
  Administered 2022-10-01: 500 [IU] via INTRAVENOUS

## 2022-10-01 MED ORDER — ONDANSETRON HCL 4 MG/2ML IJ SOLN: 8.0000 mg | Freq: Once | INTRAMUSCULAR | Status: DC

## 2022-10-01 MED ORDER — POTASSIUM CHLORIDE 10 MEQ/100ML IV SOLN
10.0000 meq | Freq: Once | INTRAVENOUS | Status: AC
  Administered 2022-10-01: 10 meq via INTRAVENOUS
  Filled 2022-10-01: qty 100

## 2022-10-01 MED ORDER — HYDROMORPHONE HCL 1 MG/ML IJ SOLN
2.0000 mg | Freq: Once | INTRAMUSCULAR | Status: AC
  Administered 2022-10-01: 2 mg via INTRAVENOUS

## 2022-10-01 MED ORDER — GADOBUTROL 1 MMOL/ML IV SOLN
7.0000 mL | Freq: Once | INTRAVENOUS | Status: AC | PRN
  Administered 2022-10-01: 7 mL via INTRAVENOUS

## 2022-10-01 MED ORDER — SODIUM CHLORIDE 0.9 % IV SOLN: 8.0000 mg | Freq: Once | INTRAVENOUS | Status: DC

## 2022-10-01 MED ORDER — HYDROMORPHONE HCL 1 MG/ML IJ SOLN
2.0000 mg | Freq: Once | INTRAMUSCULAR | Status: AC
  Filled 2022-10-01: qty 2

## 2022-10-01 MED ORDER — ONDANSETRON HCL 4 MG/2ML IJ SOLN
8.0000 mg | Freq: Once | INTRAMUSCULAR | Status: AC
  Administered 2022-10-01: 8 mg via INTRAVENOUS
  Filled 2022-10-01: qty 4

## 2022-10-01 MED ORDER — KETOROLAC TROMETHAMINE 15 MG/ML IJ SOLN
30.0000 mg | Freq: Once | INTRAMUSCULAR | Status: AC
  Administered 2022-10-01: 30 mg via INTRAVENOUS
  Filled 2022-10-01: qty 2

## 2022-10-01 MED ORDER — SODIUM CHLORIDE 0.9% FLUSH
10.0000 mL | INTRAVENOUS | Status: DC | PRN
  Administered 2022-10-01: 10 mL via INTRAVENOUS

## 2022-10-01 MED ORDER — SODIUM CHLORIDE 0.9 % IV SOLN: INTRAVENOUS | Status: DC

## 2022-10-01 NOTE — Progress Notes (Signed)
Hematology and Oncology Follow Up Visit  Rebecca Bullock [id number] 1965/12/20 57 y.o. 10/01/2022   Principle Diagnosis:  Metastatic colorectal cancer-liver metastasis- BRAF (+)   Current Therapy:        Status post cycle 1 of chemotherapy with FOLFOXIRI/Avastin Encorafenib/Vectibix -- started on 06/19/2021, s/p cycle #6 -- d/c on 10/31/2021 Intrahepatic TACE -- 03/29/2022 FOLFOXIRI/Avastin-- s/p cycle  #5-- start on 02/19/2022, -oxaliplatin dropped on 04/03/2022 -- d/c on 07/03/2022 IV iron-Ferrlecit given on 05/10/2022  Lonsurf/Avastin -- s/p cycle #1 -- start on 07/07/2022 -DC on 08/15/2022 Zometa 4 mg IV every 3 months -next dose on 11/2022  XRT to L1 vertebral body --start on 07/10/2022 Fruquintinib 5 mg po q day (21/7) --  start on 04/`09/2022 --changed to 3 mg p.o. daily (21/7) on 09/25/2022               Interim History:  [name] is here today for an early visit.  Unfortunately, we now are dealing with jaundice.  She as she was in [place] over the weekend.  She had a wonderful time in [place].  She began to have problems with nausea and vomiting.  She has had this issue before.  However, today, she calls and that she was jaundiced.  We checked her bilirubin and it was 6.9.  She does have liver metastasis.  Is possible that she may have intrahepatic biliary obstruction.  She says she has been eating a little bit better.  Again she has had the nausea.  She had her colostomy was not working but now it is working.  She has had no fever.  There is been no chills.  She has had no cough or shortness of breath.  She says that her urine is quite dark.  She has started the fruquintinib.  I told her to stop this given that her bilirubin is still high right now.  Pain is about the same.  She is on MS Contin.  She does take some Dilaudid as needed.  We will give her a little bit of Dilaudid in the office today.  Her potassium is a little bit on the low side.  We will have to give her some  IV potassium.  I do want to give her any oral potassium as I do not think she could handle this.  Currently, she really looks better than I would have thought.  I would have to say that her performance status is probably ECOG 1.   Medications:  Allergies as of 10/01/2022       Reactions   Augmentin [amoxicillin-pot Clavulanate] Diarrhea, Other (See Comments)   Extreme diarrhea, abdominal pain.   Irinotecan Other (See Comments)   Flushing, tingling, visual disturbance   Metronidazole Diarrhea, Other (See Comments)   Vancomycin Rash, Other (See Comments)   "Red Man Syndrome"   Latex Rash   Tape Rash   Wound Dressing Adhesive Rash        Medication List        Accurate as of Oct 01, 2022  2:16 PM. If you have any questions, ask your nurse or doctor.          baclofen 10 MG tablet Commonly known as: LIORESAL Take 1 tablet (10 mg total) by mouth 3 (three) times daily.   doxycycline 100 MG tablet Commonly known as: VIBRA-TABS Take 1 tablet (100 mg total) by mouth 2 (two) times daily.   famotidine 40 MG tablet Commonly known as: PEPCID Take 1 tablet (40 mg total)  by mouth 2 (two) times daily.   Fruzaqla 1 MG capsule Generic drug: fruquintinib Take 3 capsules (3 mg total) by mouth daily. Take for 21 days on, then off for 7 days. Repeat every 28 days.   HYDROmorphone 4 MG tablet Commonly known as: Dilaudid Take 1 tablet (4 mg total) by mouth every 2 (two) hours as needed for severe pain. What changed:  when to take this additional instructions   loperamide 2 MG tablet Commonly known as: IMODIUM A-D Take 2 mg by mouth 4 (four) times daily as needed for diarrhea or loose stools.   LORazepam 0.5 MG tablet Commonly known as: ATIVAN Place 1 tablet (0.5 mg total) under the tongue every 4 (four) hours as needed for anxiety.   megestrol 40 MG/ML suspension Commonly known as: MEGACE Take 10 mLs (400 mg total) by mouth 2 (two) times daily.   meloxicam 15 MG  tablet Commonly known as: MOBIC Take 1 tablet (15 mg total) by mouth daily.   morphine 30 MG 12 hr tablet Commonly known as: MS CONTIN Take 1 tablet (30 mg total) by mouth every 12 (twelve) hours.   OLANZapine 10 MG tablet Commonly known as: ZYPREXA Take 1 tablet (10 mg total) by mouth at bedtime.   ondansetron 4 MG tablet Commonly known as: ZOFRAN Take 1 tablet (4 mg total) by mouth every 6 (six) hours.   prochlorperazine 10 MG tablet Commonly known as: COMPAZINE Take 1 tablet (10 mg total) by mouth every 6 (six) hours as needed for nausea or vomiting.   Transderm-Scop 1 MG/3DAYS Generic drug: scopolamine Place one patch behind the ear every three days.        Allergies:  Allergies  Allergen Reactions   Augmentin [Amoxicillin-Pot Clavulanate] Diarrhea and Other (See Comments)    Extreme diarrhea, abdominal pain.   Irinotecan Other (See Comments)    Flushing, tingling, visual disturbance   Metronidazole Diarrhea and Other (See Comments)   Vancomycin Rash and Other (See Comments)    "Red Man Syndrome"   Latex Rash   Tape Rash   Wound Dressing Adhesive Rash    Past Medical History, Surgical history, Social history, and Family History were reviewed and updated.  Review of Systems: Review of Systems  Constitutional: Negative.   HENT: Negative.    Eyes: Negative.   Respiratory: Negative.    Cardiovascular: Negative.   Gastrointestinal: Negative.        Constipation alternating with diarrhea.   Genitourinary: Negative.   Musculoskeletal: Negative.   Skin: Negative.   Neurological: Negative.   Endo/Heme/Allergies: Negative.   Psychiatric/Behavioral: Negative.       Physical Exam: Her vital signs show temperature of 97 6.  Pulse 106.  Blood pressure 111/82.       Wt Readings from Last 3 Encounters:  10/01/22 166 lb (75.3 kg)  09/24/22 164 lb 14.4 oz (74.8 kg)  09/21/22 162 lb (73.5 kg)    Physical Exam Vitals reviewed.  Constitutional:       Comments: This is a well-developed and well-nourished white female.  She has little bit of jaundice.  HENT:     Head: Normocephalic and atraumatic.  Eyes:     General: Scleral icterus present.     Pupils: Pupils are equal, round, and reactive to light.  Cardiovascular:     Rate and Rhythm: Normal rate and regular rhythm.     Heart sounds: Normal heart sounds.  Pulmonary:     Effort: Pulmonary effort is normal.  Breath sounds: Normal breath sounds.  Abdominal:     General: Bowel sounds are normal.     Palpations: Abdomen is soft.     Comments: Abdominal exam is soft.  She has a colostomy in the right lower quadrant.  She has no fluid wave.  There is no guarding or rebound tenderness.  There is no hepatomegaly.  Musculoskeletal:        General: No tenderness or deformity. Normal range of motion.     Cervical back: Normal range of motion.  Lymphadenopathy:     Cervical: No cervical adenopathy.  Skin:    General: Skin is warm and dry.     Findings: No erythema or rash.  Neurological:     Mental Status: She is alert and oriented to person, place, and time.  Psychiatric:        Behavior: Behavior normal.        Thought Content: Thought content normal.        Judgment: Judgment normal.      Lab Results  Component Value Date   WBC 3.6 (L) 10/01/2022   HGB 10.6 (L) 10/01/2022   HCT 33.2 (L) 10/01/2022   MCV 92.2 10/01/2022   PLT 184 10/01/2022   Lab Results  Component Value Date   FERRITIN 312 (H) 09/21/2022   IRON 58 09/21/2022   TIBC 286 09/21/2022   UIBC 228 09/21/2022   IRONPCTSAT 20 09/21/2022   Lab Results  Component Value Date   RETICCTPCT 1.6 08/07/2022   RBC 3.60 (L) 10/01/2022   No results found for: "KPAFRELGTCHN", "LAMBDASER", "KAPLAMBRATIO" No results found for: "IGGSERUM", "IGA", "IGMSERUM" No results found for: "TOTALPROTELP", "ALBUMINELP", "A1GS", "A2GS", "BETS", "BETA2SER", "GAMS", "MSPIKE", "SPEI"   Chemistry      Component Value Date/Time   NA  138 10/01/2022 1136   K 3.0 (L) 10/01/2022 1136   CL 104 10/01/2022 1136   CO2 24 10/01/2022 1136   BUN 7 10/01/2022 1136   CREATININE 0.49 10/01/2022 1136   CREATININE 0.56 03/23/2021 0000      Component Value Date/Time   CALCIUM 8.6 (L) 10/01/2022 1136   ALKPHOS 759 (H) 10/01/2022 1136   AST 110 (H) 10/01/2022 1136   ALT 59 (H) 10/01/2022 1136   BILITOT 6.9 (HH) 10/01/2022 1136       Impression and Plan: Ms. Delaroca is a very charming  57 yo caucasian female with metastatic colon cancer.  She had disease confined to her liver.  However, she then began to have progression.  She had disease in her L1 vertebral body.  She has received radiation therapy to this.  We had her on Lonsurf and she could not tolerate this at all.  She has been hospitalized.  She then developed a bowel obstruction requiring emergency surgery for this.  This bilirubin rises certainly troublesome to me.  Again we will have to get an MRCP to see if this may show where the obstruction is.  I talked to her about this.  I think her daughter was with her.  I told her that this obstruction could be either in the liver or outside of the liver.  The pain over the obstruction is we will determine how we try to fix this.  Hopefully, she will not need to have a percutaneous transhepatic drain placed.  This would really be a aggravation for her.  If this is a extra hepatic obstruction, maybe a stent could be placed.  Again, she actually looks little bit better than  I would have thought.  Right now, we will just have to see what the MRCP shows.  She will get her IV fluids.  This is incredibly complicated.  Our options for treating the actual cancer are very few at this point.    Josph Macho, MD 5/6/20242:16 PM  ADDENDUM: She had MRI done the night of 10/01/2022.  Unfortunately, it looks like this is a intrahepatic biliary obstruction.  I spoke with Dr. Fredia Sorrow of Interventional Radiology.  He said that if this was  going to be bypassed, that she would have to be in the hospital and that Interventional Radiology and Gastroenterology would have to try to look at the situation and see how they might be able to best bypasses biliary obstruction.  He said that it would be a difficult percutaneous procedure.  The blockage is clearly intra hepatic.  The blockages clearly  from her hepatic masses.  I spoke to her this morning about this.  I explained to her the situation.  She wants to try to have the bilirubin improved.  As such, she agrees to go to the hospital.  I spoke to the charge nurse in the ER at [facility].  They will be expecting her.  I am so thankful that the Hospitalist will admit her.  We will have to get Gastroenterology involved.  I am unsure if she could have an ERCP.  Again this looks like it is intrahepatic.  I know that our specialists are incredibly skilled and we will do all that they can to try to help alleviate the blockage so that she will not have difficulties with hyperbilirubinemia and subsequent complications.  Christin Bach, MD

## 2022-10-01 NOTE — Telephone Encounter (Signed)
Critical bilirubin of 6.9 received from lab. MD aware and will see patient today

## 2022-10-01 NOTE — Patient Instructions (Signed)

## 2022-10-01 NOTE — Patient Instructions (Signed)
Dehydration, Adult Dehydration is a condition in which there is not enough water or other fluids in the body. This happens when a person loses more fluids than they take in. Important organs cannot work right without the right amount of fluids. Any loss of fluids from the body can cause dehydration. Dehydration can be mild, worse, or very bad. It should be treated right away to keep it from getting very bad. What are the causes? Conditions that cause loss of water in the body. They include: Watery poop (diarrhea). Vomiting. Sweating a lot. Fever. Infection. Peeing (urinating) a lot. Not drinking enough fluids. Certain medicines, such as medicines that take extra fluid out of the body (diuretics). Lack of safe drinking water. Not being able to get enough water and food. What increases the risk? Having a long-term (chronic) illness that has not been treated the right way, such as: Diabetes. Heart disease. Kidney disease. Being 65 years of age or older. Having a disability. Living in a place that is high above the ground or sea (high in altitude). The thinner, drier air causes more fluid loss. Doing exercises that put stress on your body for a long time. Being active when in hot places. What are the signs or symptoms? Symptoms of dehydration depend on how bad it is. Mild or worse dehydration Thirst. Dry lips or dry mouth. Feeling dizzy or light-headed. Muscle cramps. Passing little pee or dark pee. Pee may be the color of tea. Headache. Very bad dehydration Changes in skin. Skin may: Be cold to the touch (clammy). Be blotchy or pale. Not go back to normal right after you pinch it and let it go. Little or no tears, pee, or sweat. Fast breathing. Low blood pressure. Weak pulse. Pulse that is more than 100 beats a minute when you are sitting still. Other changes, such as: Feeling very thirsty. Eyes that look hollow (sunken). Cold hands and feet. Being confused. Being very  tired (lethargic) or having trouble waking from sleep. Losing weight. Loss of consciousness. How is this treated? Treatment for this condition depends on how bad your dehydration is. Treatment should start right away. Do not wait until your condition gets very bad. Very bad dehydration is an emergency. You will need to go to a hospital. Mild or worse dehydration can be treated at home. You may be asked to: Drink more fluids. Drink an oral rehydration solution (ORS). This drink gives you the right amount of fluids, salts, and minerals (electrolytes). Very bad dehydration can be treated: With fluids through an IV tube. By correcting low levels of electrolytes in the body. By treating the problem that caused your dehydration. Follow these instructions at home: Oral rehydration solution If told by your doctor, drink an ORS: Make an ORS. Use instructions on the package. Start by drinking small amounts, about  cup (120 mL) every 5-10 minutes. Slowly drink more until you have had the amount that your doctor said to have.  Eating and drinking  Drink enough clear fluid to keep your pee pale yellow. If you were told to drink an ORS, finish the ORS first. Then, start slowly drinking other clear fluids. Drink fluids such as: Water. Do not drink only water. Doing that can make the salt (sodium) level in your body get too low. Water from ice chips you suck on. Fruit juice that you have added water to (diluted). Low-calorie sports drinks. Eat foods that have the right amounts of salts and minerals, such as bananas, oranges, potatoes,   tomatoes, or spinach. Do not drink alcohol. Avoid drinks that have caffeine or sugar. These include:: High-calorie sports drinks. Fruit juice that you did not add water to. Soda. Coffee or energy drinks. Avoid foods that are greasy or have a lot of fat or sugar. General instructions Take over-the-counter and prescription medicines only as told by your doctor. Do  not take sodium tablets. Doing that can make the salt level in your body get too high. Return to your normal activities as told by your doctor. Ask your doctor what activities are safe for you. Keep all follow-up visits. Your doctor may check and change your treatment. Contact a doctor if: You have pain in your belly (abdomen) and the pain: Gets worse. Stays in one place. You have a rash. You have a stiff neck. You get angry or annoyed more easily than normal. You are more tired or have a harder time waking than normal. You feel weak or dizzy. You feel very thirsty. Get help right away if: You have any symptoms of very bad dehydration. You vomit every time you eat or drink. Your vomiting gets worse, does not go away, or you vomit blood or green stuff. You are getting treatment, but symptoms are getting worse. You have a fever. You have a very bad headache. You have: Diarrhea that gets worse or does not go away. Blood in your poop (stool). This may cause poop to look black and tarry. No pee in 6-8 hours. Only a small amount of pee in 6-8 hours, and the pee is very dark. You have trouble breathing. These symptoms may be an emergency. Get help right away. Call 911. Do not wait to see if the symptoms will go away. Do not drive yourself to the hospital. This information is not intended to replace advice given to you by your health care provider. Make sure you discuss any questions you have with your health care provider. Document Revised: 12/11/2021 Document Reviewed: 12/11/2021 Elsevier Patient Education  2023 Elsevier Inc.  

## 2022-10-02 ENCOUNTER — Inpatient Hospital Stay (HOSPITAL_COMMUNITY)
Admission: EM | Admit: 2022-10-02 | Discharge: 2022-10-08 | DRG: 444 | Disposition: A | Discharge status: Home / Self Care | Payer: Medicaid Other | Attending: Internal Medicine | Admitting: Internal Medicine

## 2022-10-02 ENCOUNTER — Other Ambulatory Visit: Payer: Self-pay

## 2022-10-02 ENCOUNTER — Encounter (HOSPITAL_COMMUNITY): Payer: Self-pay

## 2022-10-02 DIAGNOSIS — E559 Vitamin D deficiency, unspecified: Secondary | ICD-10-CM | POA: Diagnosis present

## 2022-10-02 DIAGNOSIS — Z85038 Personal history of other malignant neoplasm of large intestine: Secondary | ICD-10-CM | POA: Diagnosis not present

## 2022-10-02 DIAGNOSIS — Z833 Family history of diabetes mellitus: Secondary | ICD-10-CM | POA: Diagnosis not present

## 2022-10-02 DIAGNOSIS — C189 Malignant neoplasm of colon, unspecified: Secondary | ICD-10-CM | POA: Diagnosis present

## 2022-10-02 DIAGNOSIS — D61818 Other pancytopenia: Secondary | ICD-10-CM | POA: Diagnosis present

## 2022-10-02 DIAGNOSIS — Z932 Ileostomy status: Secondary | ICD-10-CM | POA: Diagnosis not present

## 2022-10-02 DIAGNOSIS — D61810 Antineoplastic chemotherapy induced pancytopenia: Secondary | ICD-10-CM | POA: Diagnosis present

## 2022-10-02 DIAGNOSIS — Y929 Unspecified place or not applicable: Secondary | ICD-10-CM

## 2022-10-02 DIAGNOSIS — Z91048 Other nonmedicinal substance allergy status: Secondary | ICD-10-CM

## 2022-10-02 DIAGNOSIS — Z933 Colostomy status: Secondary | ICD-10-CM

## 2022-10-02 DIAGNOSIS — G893 Neoplasm related pain (acute) (chronic): Secondary | ICD-10-CM | POA: Diagnosis present

## 2022-10-02 DIAGNOSIS — E669 Obesity, unspecified: Secondary | ICD-10-CM | POA: Diagnosis present

## 2022-10-02 DIAGNOSIS — K831 Obstruction of bile duct: Secondary | ICD-10-CM | POA: Diagnosis present

## 2022-10-02 DIAGNOSIS — Z880 Allergy status to penicillin: Secondary | ICD-10-CM

## 2022-10-02 DIAGNOSIS — Z800 Family history of malignant neoplasm of digestive organs: Secondary | ICD-10-CM | POA: Diagnosis not present

## 2022-10-02 DIAGNOSIS — C801 Malignant (primary) neoplasm, unspecified: Secondary | ICD-10-CM | POA: Diagnosis not present

## 2022-10-02 DIAGNOSIS — Z791 Long term (current) use of non-steroidal anti-inflammatories (NSAID): Secondary | ICD-10-CM

## 2022-10-02 DIAGNOSIS — D509 Iron deficiency anemia, unspecified: Secondary | ICD-10-CM | POA: Diagnosis present

## 2022-10-02 DIAGNOSIS — T451X5A Adverse effect of antineoplastic and immunosuppressive drugs, initial encounter: Secondary | ICD-10-CM | POA: Diagnosis present

## 2022-10-02 DIAGNOSIS — Z820 Family history of epilepsy and other diseases of the nervous system: Secondary | ICD-10-CM | POA: Diagnosis not present

## 2022-10-02 DIAGNOSIS — Z881 Allergy status to other antibiotic agents status: Secondary | ICD-10-CM

## 2022-10-02 DIAGNOSIS — E876 Hypokalemia: Secondary | ICD-10-CM | POA: Diagnosis present

## 2022-10-02 DIAGNOSIS — Z515 Encounter for palliative care: Secondary | ICD-10-CM

## 2022-10-02 DIAGNOSIS — Z7189 Other specified counseling: Secondary | ICD-10-CM | POA: Diagnosis not present

## 2022-10-02 DIAGNOSIS — C787 Secondary malignant neoplasm of liver and intrahepatic bile duct: Secondary | ICD-10-CM | POA: Diagnosis present

## 2022-10-02 DIAGNOSIS — F419 Anxiety disorder, unspecified: Secondary | ICD-10-CM | POA: Diagnosis present

## 2022-10-02 DIAGNOSIS — Z91040 Latex allergy status: Secondary | ICD-10-CM

## 2022-10-02 DIAGNOSIS — Z801 Family history of malignant neoplasm of trachea, bronchus and lung: Secondary | ICD-10-CM | POA: Diagnosis not present

## 2022-10-02 DIAGNOSIS — Z923 Personal history of irradiation: Secondary | ICD-10-CM | POA: Diagnosis not present

## 2022-10-02 DIAGNOSIS — Z888 Allergy status to other drugs, medicaments and biological substances status: Secondary | ICD-10-CM

## 2022-10-02 DIAGNOSIS — Z6831 Body mass index (BMI) 31.0-31.9, adult: Secondary | ICD-10-CM | POA: Diagnosis not present

## 2022-10-02 DIAGNOSIS — Z79899 Other long term (current) drug therapy: Secondary | ICD-10-CM

## 2022-10-02 LAB — PROTIME-INR
INR: 1.2 (ref 0.8–1.2)
Prothrombin Time: 15.4 seconds — ABNORMAL HIGH (ref 11.4–15.2)

## 2022-10-02 LAB — CBC
HCT: 31.4 % — ABNORMAL LOW (ref 36.0–46.0)
Hemoglobin: 9.7 g/dL — ABNORMAL LOW (ref 12.0–15.0)
MCH: 29.3 pg (ref 26.0–34.0)
MCHC: 30.9 g/dL (ref 30.0–36.0)
MCV: 94.9 fL (ref 80.0–100.0)
Platelets: 158 10*3/uL (ref 150–400)
RBC: 3.31 MIL/uL — ABNORMAL LOW (ref 3.87–5.11)
RDW: 18.4 % — ABNORMAL HIGH (ref 11.5–15.5)
WBC: 3.8 10*3/uL — ABNORMAL LOW (ref 4.0–10.5)
nRBC: 0 % (ref 0.0–0.2)

## 2022-10-02 LAB — COMPREHENSIVE METABOLIC PANEL
ALT: 60 U/L — ABNORMAL HIGH (ref 0–44)
AST: 102 U/L — ABNORMAL HIGH (ref 15–41)
Albumin: 2.5 g/dL — ABNORMAL LOW (ref 3.5–5.0)
Alkaline Phosphatase: 654 U/L — ABNORMAL HIGH (ref 38–126)
Anion gap: 7 (ref 5–15)
BUN: <5 mg/dL — ABNORMAL LOW (ref 6–20)
CO2: 23 mmol/L (ref 22–32)
Calcium: 8 mg/dL — ABNORMAL LOW (ref 8.9–10.3)
Chloride: 108 mmol/L (ref 98–111)
Creatinine, Ser: 0.38 mg/dL — ABNORMAL LOW (ref 0.44–1.00)
GFR, Estimated: >60 mL/min (ref >60)
Glucose, Bld: 161 mg/dL — ABNORMAL HIGH (ref 70–99)
Potassium: 2.7 mmol/L — CL (ref 3.5–5.1)
Sodium: 138 mmol/L (ref 135–145)
Total Bilirubin: 6.3 mg/dL — ABNORMAL HIGH (ref 0.3–1.2)
Total Protein: 6.2 g/dL — ABNORMAL LOW (ref 6.5–8.1)

## 2022-10-02 LAB — MAGNESIUM: Magnesium: 1.6 mg/dL — ABNORMAL LOW (ref 1.7–2.4)

## 2022-10-02 MED ORDER — NALOXONE HCL 0.4 MG/ML IJ SOLN: 0.4000 mg | INTRAMUSCULAR | Status: DC | PRN

## 2022-10-02 MED ORDER — LORAZEPAM 0.5 MG PO TABS: 0.5000 mg | ORAL_TABLET | ORAL | Status: DC | PRN

## 2022-10-02 MED ORDER — BACLOFEN 10 MG PO TABS
10.0000 mg | ORAL_TABLET | Freq: Three times a day (TID) | ORAL | Status: DC
  Administered 2022-10-02 – 2022-10-08 (×16): 10 mg via ORAL
  Filled 2022-10-02 (×16): qty 1

## 2022-10-02 MED ORDER — SCOPOLAMINE 1 MG/3DAYS TD PT72
1.0000 | MEDICATED_PATCH | TRANSDERMAL | Status: DC
  Administered 2022-10-05 – 2022-10-08 (×2): 1.5 mg via TRANSDERMAL
  Filled 2022-10-02 (×2): qty 1

## 2022-10-02 MED ORDER — LEVOFLOXACIN IN D5W 500 MG/100ML IV SOLN
500.0000 mg | INTRAVENOUS | Status: AC
  Administered 2022-10-03: 500 mg via INTRAVENOUS
  Filled 2022-10-02 (×3): qty 100

## 2022-10-02 MED ORDER — POTASSIUM CHLORIDE IN NACL 40-0.9 MEQ/L-% IV SOLN
INTRAVENOUS | Status: AC
  Filled 2022-10-02 (×3): qty 1000

## 2022-10-02 MED ORDER — ONDANSETRON HCL 4 MG/2ML IJ SOLN: 4.0000 mg | Freq: Four times a day (QID) | INTRAMUSCULAR | Status: DC | PRN

## 2022-10-02 MED ORDER — FAMOTIDINE 20 MG PO TABS
20.0000 mg | ORAL_TABLET | Freq: Two times a day (BID) | ORAL | Status: DC
  Administered 2022-10-02 – 2022-10-08 (×12): 20 mg via ORAL
  Filled 2022-10-02 (×12): qty 1

## 2022-10-02 MED ORDER — MAGNESIUM SULFATE 2 GM/50ML IV SOLN
2.0000 g | Freq: Once | INTRAVENOUS | Status: AC
  Administered 2022-10-02: 2 g via INTRAVENOUS
  Filled 2022-10-02: qty 50

## 2022-10-02 MED ORDER — HYDROMORPHONE HCL 2 MG/ML IJ SOLN
2.0000 mg | Freq: Once | INTRAMUSCULAR | Status: AC
  Administered 2022-10-02: 2 mg via INTRAVENOUS
  Filled 2022-10-02: qty 1

## 2022-10-02 MED ORDER — HYDROMORPHONE HCL 1 MG/ML IJ SOLN
1.0000 mg | INTRAMUSCULAR | Status: DC | PRN
  Administered 2022-10-02 – 2022-10-03 (×6): 1 mg via INTRAVENOUS
  Filled 2022-10-02 (×6): qty 1

## 2022-10-02 MED ORDER — DOXYCYCLINE HYCLATE 100 MG PO TABS
100.0000 mg | ORAL_TABLET | Freq: Two times a day (BID) | ORAL | Status: AC
  Administered 2022-10-02 – 2022-10-04 (×5): 100 mg via ORAL
  Filled 2022-10-02 (×5): qty 1

## 2022-10-02 MED ORDER — POTASSIUM CHLORIDE CRYS ER 20 MEQ PO TBCR
40.0000 meq | EXTENDED_RELEASE_TABLET | Freq: Once | ORAL | Status: AC
  Administered 2022-10-02: 40 meq via ORAL
  Filled 2022-10-02: qty 2

## 2022-10-02 MED ORDER — HYDROMORPHONE HCL 1 MG/ML IJ SOLN
1.0000 mg | Freq: Once | INTRAMUSCULAR | Status: AC
  Administered 2022-10-02: 1 mg via INTRAVENOUS
  Filled 2022-10-02: qty 1

## 2022-10-02 MED ORDER — OLANZAPINE 10 MG PO TABS
10.0000 mg | ORAL_TABLET | Freq: Every day | ORAL | Status: DC
  Administered 2022-10-02 – 2022-10-07 (×6): 10 mg via ORAL
  Filled 2022-10-02 (×3): qty 1
  Filled 2022-10-02: qty 2
  Filled 2022-10-02: qty 1
  Filled 2022-10-02: qty 2
  Filled 2022-10-02: qty 1
  Filled 2022-10-02: qty 2
  Filled 2022-10-02: qty 1
  Filled 2022-10-02: qty 2
  Filled 2022-10-02: qty 1
  Filled 2022-10-02: qty 2

## 2022-10-02 MED ORDER — MORPHINE SULFATE ER 15 MG PO TBCR
30.0000 mg | EXTENDED_RELEASE_TABLET | Freq: Two times a day (BID) | ORAL | Status: DC
  Administered 2022-10-02 – 2022-10-04 (×5): 30 mg via ORAL
  Filled 2022-10-02 (×5): qty 2

## 2022-10-02 MED ORDER — MEGESTROL ACETATE 400 MG/10ML PO SUSP
400.0000 mg | Freq: Two times a day (BID) | ORAL | Status: DC
  Administered 2022-10-03 – 2022-10-08 (×7): 400 mg via ORAL
  Filled 2022-10-02 (×13): qty 10

## 2022-10-02 MED ORDER — ONDANSETRON HCL 4 MG/2ML IJ SOLN
4.0000 mg | Freq: Once | INTRAMUSCULAR | Status: AC
  Administered 2022-10-02: 4 mg via INTRAVENOUS
  Filled 2022-10-02: qty 2

## 2022-10-02 MED ORDER — ONDANSETRON HCL 4 MG PO TABS: 4.0000 mg | ORAL_TABLET | Freq: Four times a day (QID) | ORAL | Status: DC | PRN

## 2022-10-02 NOTE — Progress Notes (Cosign Needed)
Referring Physician(s): RebeccaP/Rebecca Bullock,N  Supervising Physician: Rebecca Bullock  Patient Status:  Rebecca Bullock  Chief Complaint:  Abdominal/back pain, nausea/vomiting, jaundice  Subjective: Patient known to IR service from Port-A-Cath placement in 2022 and right hepatic Y90 radioembolization on 03/29/2022.  She is a 57 year old female with history of metastatic colon cancer to liver and L1 vertebral body.  Status post laparoscopic loop ileostomy in March of this year.  She presented to the [facility], ED today with abdominal/back pain, nausea, vomiting, jaundice with total bilirubin of 6.9.  MRI abdomen/MRCP today revealed:  1. There is mild dilatation of the intrahepatic bile ducts to the right lobe of liver, increased from previous exam. New moderate to severe dilatation of the intrahepatic bile ducts to the left lobe of liver. Dilated bile ducts converge within the central liver. Suspect underlying malignant stricture secondary to the known large infiltrative tumor. The common bile duct is nondilated measuring up to 4 mm. No convincing evidence for choledocholithiasis. 2. Liver metastases are again noted. The dominant mass within the posterior right hepatic lobe is stable in size from previous exam. 3. Index upper abdominal adenopathy is unchanged. 4. Metastatic lesion involving the L1 vertebra with mild superior endplate compression deformity is unchanged.  She is currently afebrile, WBC 3.8, hemoglobin 9.7, platelets 158K, creatinine 0.49, PT 15.4, INR 1.2.  Request received from ED /oncology to evaluate patient for possible Rebecca Bullock with biliary drain placement.  GI follow-up pending(seen in March by Dr. Loreta Bullock).    Past Medical History:  Diagnosis Date   Colon cancer metastasized to liver (HCC) 05/05/2021   Family history of lung cancer    Family history of pancreatic cancer    Goals of Bullock, counseling/discussion 05/05/2021   History of radiation therapy    Lumbar Spine-  07/12/22-07/25/22- Dr. Antony Bullock   Past Surgical History:  Procedure Laterality Date   IR 3D INDEPENDENT WKST  03/15/2022   IR ANGIOGRAM SELECTIVE EACH ADDITIONAL VESSEL  03/15/2022   IR ANGIOGRAM SELECTIVE EACH ADDITIONAL VESSEL  03/15/2022   IR ANGIOGRAM SELECTIVE EACH ADDITIONAL VESSEL  03/15/2022   IR ANGIOGRAM SELECTIVE EACH ADDITIONAL VESSEL  03/29/2022   IR ANGIOGRAM SELECTIVE EACH ADDITIONAL VESSEL  03/29/2022   IR ANGIOGRAM VISCERAL SELECTIVE  03/15/2022   IR ANGIOGRAM VISCERAL SELECTIVE  03/29/2022   IR EMBO ARTERIAL NOT HEMORR HEMANG INC GUIDE ROADMAPPING  03/15/2022   IR EMBO TUMOR ORGAN ISCHEMIA INFARCT INC GUIDE ROADMAPPING  03/29/2022   IR IMAGING GUIDED PORT INSERTION  05/11/2021   IR RADIOLOGIST EVAL & MGMT  02/15/2022   IR RADIOLOGIST EVAL & MGMT  04/23/2022   IR RADIOLOGIST EVAL & MGMT  06/25/2022   IR US GUIDE VASC ACCESS RIGHT  03/29/2022   LAPAROSCOPY N/A 08/21/2022   Procedure: LAPAROSCOPIC LOOP ILEOSTOMY;  Surgeon: Rebecca Loron, MD;  Location: [facility];  Service: General;  Laterality: N/A;   uterine ablation         Allergies: Augmentin [amoxicillin-pot clavulanate], Irinotecan, Metronidazole, Vancomycin, Latex, Tape, and Wound dressing adhesive  Medications: Prior to Admission medications   Medication Sig Start Date End Date Taking? Authorizing Provider  baclofen (LIORESAL) 10 MG tablet Take 1 tablet (10 mg total) by mouth 3 (three) times daily. 08/28/22  Yes Rebecca Bullock, Rebecca Phi, MD  doxycycline (VIBRA-TABS) 100 MG tablet Take 1 tablet (100 mg total) by mouth 2 (two) times daily. 09/25/22  Yes Rebecca Macho, MD  famotidine (PEPCID) 40 MG tablet Take 1 tablet (40 mg total) by mouth  2 (two) times daily. 08/07/22  Yes Rebecca Bullock, Rebecca Phi, MD  HYDROmorphone (DILAUDID) 4 MG tablet Take 1 tablet (4 mg total) by mouth every 2 (two) hours as needed for severe pain. Patient taking differently: Take 4 mg by mouth every 4 (four) hours as needed for severe pain. Per  Rebecca Bullock: Take every 4-6 hour prn. 09/12/22  Yes Rebecca Blank, Rebecca Bullock  LORazepam (ATIVAN) 0.5 MG tablet Place 1 tablet (0.5 mg total) under the tongue every 4 (four) hours as needed for anxiety. 09/21/22  Yes Rebecca Macho, MD  megestrol (MEGACE) 400 MG/10ML suspension Take 10 mLs (400 mg total) by mouth 2 (two) times daily. 09/21/22  Yes Rebecca Macho, MD  meloxicam (MOBIC) 15 MG tablet Take 1 tablet (15 mg total) by mouth daily. 09/24/22  Yes Rebecca Bullock, Rebecca Baumgartner, Rebecca Bullock  morphine (MS CONTIN) 30 MG 12 hr tablet Take 1 tablet (30 mg total) by mouth every 12 (twelve) hours. 09/21/22  Yes Rebecca Bullock, Rebecca Phi, MD  OLANZapine (ZYPREXA) 10 MG tablet Take 1 tablet (10 mg total) by mouth at bedtime. 09/21/22  Yes Rebecca Macho, MD  scopolamine (TRANSDERM-SCOP) 1 MG/3DAYS Place one patch behind the ear every three days. 09/10/22  Yes Rebecca Bullock, Rebecca Phi, MD  fruquintinib (FRUZAQLA) 1 MG capsule Take 3 capsules (3 mg total) by mouth daily. Take for 21 days on, then off for 7 days. Repeat every 28 days. 09/25/22   Rebecca Macho, MD  ondansetron (ZOFRAN) 4 MG tablet Take 1 tablet (4 mg total) by mouth every 6 (six) hours. Patient not taking: Reported on 09/25/2022 09/04/22   Rebecca Corrigan, Rebecca Bullock     Vital Signs: BP 114/67   Pulse 65   Temp 97.6 F (36.4 C)   Resp 18   Ht 5\' 1"  (1.549 m)   Wt 166 lb (75.3 kg)   SpO2 97%   BMI 31.37 kg/m   Physical Exam: Awake, alert.  Jaundice/ scleral  icterus noted.  Chest clear  to auscultation bilaterally.  Intact right chest wall Port-A-Cath.  Heart with regular rate and rhythm.  Abdomen soft, intact ostomy, currently nontender, positive bowel sounds.  No lower extremity edema.  Imaging: MR 3D Recon At Scanner  Result Date: 10/02/2022 CLINICAL DATA:  Evaluate bile duct dilatation. Metastatic colon cancer EXAM: MRI ABDOMEN WITHOUT AND WITH CONTRAST (INCLUDING MRCP) TECHNIQUE: Multiplanar multisequence MR imaging of the abdomen was performed both before  and after the administration of intravenous contrast. Heavily T2-weighted images of the biliary and pancreatic ducts were obtained, and three-dimensional MRCP images were rendered by post processing. CONTRAST:  7mL GADAVIST GADOBUTROL 1 MMOL/ML IV SOLN COMPARISON:  09/04/2022 FINDINGS: Lower chest: No acute findings. Hepatobiliary: Liver metastases are again noted. Dominant mass within the posterior right hepatic lobe measures 9.4 by 9.7 by 9.5 cm (volume = 450 cm^3), image 14/19. On the previous exam this measured 10.3 x 10.2 by 8.7 cm (volume = 480 cm^3). Lesion in the lateral segment of left hepatic lobe measures 0.8 cm, image 21/19. Unchanged from previous exam. Cyst within the inferior right lobe of liver unchanged measuring 2.1 cm, image 25/4. Multiple stones are identified within the gallbladder. The largest stone is in the gallbladder neck measuring 1.7 cm. The gallbladder wall appears diffusely edematous measuring 7 mm, image 11/3. There is mild dilatation of the intrahepatic bile ducts to the right lobe of liver, increased from previous exam. New moderate to severe dilatation of the intrahepatic bile ducts to the left  lobe of liver. Dilated bile ducts converge within the central liver. Suspect underlying malignant stricture secondary to infiltrative tumor. The common bile duct is nondilated measuring up to 4 mm. No convincing evidence for choledocholithiasis. Pancreas: No mass, inflammatory changes, or other parenchymal abnormality identified. Spleen: Stable 1.5 cm splenic lesion which is favored to represent a benign abnormality. Adrenals/Urinary Tract: Normal adrenal glands. Small cyst within inferior pole of left kidney is unchanged. No follow-up imaging recommended. No hydronephrosis. Stomach/Bowel: Stomach appears normal. Right abdominal loop ileostomy. No dilated bowel loops identified. Vascular/Lymphatic: Normal caliber of the abdominal aorta. Portal vein, portal venous confluence and SMV remain  patent. Index upper abdominal adenopathy is again seen. Enlarged portacaval lymph node measures 1.6 cm short axis, image 16/5. Unchanged from previous exam. Aortocaval lymph node measures 1.2 cm, image 30/5. Previously 1.3 cm. Other:  No significant free fluid or fluid collections. Musculoskeletal: Metastatic lesion involving the L1 vertebra with mild superior endplate compression deformity is unchanged. IMPRESSION: 1. There is mild dilatation of the intrahepatic bile ducts to the right lobe of liver, increased from previous exam. New moderate to severe dilatation of the intrahepatic bile ducts to the left lobe of liver. Dilated bile ducts converge within the central liver. Suspect underlying malignant stricture secondary to the known large infiltrative tumor. The common bile duct is nondilated measuring up to 4 mm. No convincing evidence for choledocholithiasis. 2. Liver metastases are again noted. The dominant mass within the posterior right hepatic lobe is stable in size from previous exam. 3. Index upper abdominal adenopathy is unchanged. 4. Metastatic lesion involving the L1 vertebra with mild superior endplate compression deformity is unchanged. Electronically Signed   By: Signa Kell M.D.   On: 10/02/2022 05:38   MR ABDOMEN MRCP W WO CONTAST  Result Date: 10/02/2022 CLINICAL DATA:  Evaluate bile duct dilatation. Metastatic colon cancer EXAM: MRI ABDOMEN WITHOUT AND WITH CONTRAST (INCLUDING MRCP) TECHNIQUE: Multiplanar multisequence MR imaging of the abdomen was performed both before and after the administration of intravenous contrast. Heavily T2-weighted images of the biliary and pancreatic ducts were obtained, and three-dimensional MRCP images were rendered by post processing. CONTRAST:  7mL GADAVIST GADOBUTROL 1 MMOL/ML IV SOLN COMPARISON:  09/04/2022 FINDINGS: Lower chest: No acute findings. Hepatobiliary: Liver metastases are again noted. Dominant mass within the posterior right hepatic lobe  measures 9.4 by 9.7 by 9.5 cm (volume = 450 cm^3), image 14/19. On the previous exam this measured 10.3 x 10.2 by 8.7 cm (volume = 480 cm^3). Lesion in the lateral segment of left hepatic lobe measures 0.8 cm, image 21/19. Unchanged from previous exam. Cyst within the inferior right lobe of liver unchanged measuring 2.1 cm, image 25/4. Multiple stones are identified within the gallbladder. The largest stone is in the gallbladder neck measuring 1.7 cm. The gallbladder wall appears diffusely edematous measuring 7 mm, image 11/3. There is mild dilatation of the intrahepatic bile ducts to the right lobe of liver, increased from previous exam. New moderate to severe dilatation of the intrahepatic bile ducts to the left lobe of liver. Dilated bile ducts converge within the central liver. Suspect underlying malignant stricture secondary to infiltrative tumor. The common bile duct is nondilated measuring up to 4 mm. No convincing evidence for choledocholithiasis. Pancreas: No mass, inflammatory changes, or other parenchymal abnormality identified. Spleen: Stable 1.5 cm splenic lesion which is favored to represent a benign abnormality. Adrenals/Urinary Tract: Normal adrenal glands. Small cyst within inferior pole of left kidney is unchanged. No follow-up imaging recommended.  No hydronephrosis. Stomach/Bowel: Stomach appears normal. Right abdominal loop ileostomy. No dilated bowel loops identified. Vascular/Lymphatic: Normal caliber of the abdominal aorta. Portal vein, portal venous confluence and SMV remain patent. Index upper abdominal adenopathy is again seen. Enlarged portacaval lymph node measures 1.6 cm short axis, image 16/5. Unchanged from previous exam. Aortocaval lymph node measures 1.2 cm, image 30/5. Previously 1.3 cm. Other:  No significant free fluid or fluid collections. Musculoskeletal: Metastatic lesion involving the L1 vertebra with mild superior endplate compression deformity is unchanged. IMPRESSION: 1.  There is mild dilatation of the intrahepatic bile ducts to the right lobe of liver, increased from previous exam. New moderate to severe dilatation of the intrahepatic bile ducts to the left lobe of liver. Dilated bile ducts converge within the central liver. Suspect underlying malignant stricture secondary to the known large infiltrative tumor. The common bile duct is nondilated measuring up to 4 mm. No convincing evidence for choledocholithiasis. 2. Liver metastases are again noted. The dominant mass within the posterior right hepatic lobe is stable in size from previous exam. 3. Index upper abdominal adenopathy is unchanged. 4. Metastatic lesion involving the L1 vertebra with mild superior endplate compression deformity is unchanged. Electronically Signed   By: Signa Kell M.D.   On: 10/02/2022 05:38    Labs:  CBC: Recent Labs    09/21/22 1004 09/25/22 1043 10/01/22 1136 10/02/22 1402  WBC 3.5* 3.1* 3.6* 3.8*  HGB 12.2 12.0 10.6* 9.7*  HCT 38.0 37.7 33.2* 31.4*  PLT 194 138* 184 158    COAGS: Recent Labs    03/15/22 0801 03/29/22 0820 10/02/22 1402  INR 1.0 1.0 1.2    BMP: Recent Labs    09/21/22 1004 09/25/22 1043 10/01/22 1136 10/02/22 1402  NA 136 138 138 138  K 3.4* 3.4* 3.0* 2.7*  CL 98 100 104 108  CO2 28 26 24 23   GLUCOSE 119* 143* 149* 161*  BUN <5* 5* 7 <5*  CALCIUM 9.2 8.4* 8.6* 8.0*  CREATININE 0.61 0.58 0.49 0.38*  GFRNONAA >60 >60 >60 >60    LIVER FUNCTION TESTS: Recent Labs    09/21/22 1004 09/25/22 1043 10/01/22 1136 10/02/22 1402  BILITOT 0.6 2.9* 6.9* 6.3*  AST 47* 159* 110* 102*  ALT 25 61* 59* 60*  ALKPHOS 412* 670* 759* 654*  PROT 7.3 6.4* 6.8 6.2*  ALBUMIN 3.7 3.2* 3.4* 2.5*    Assessment and Plan: 57 year old female with history of metastatic colon cancer to liver and L1 vertebral body. She is s/p port-A-Cath placement in 2022 and right hepatic Y90 radioembolization on 03/29/2022 . Status post laparoscopic loop ileostomy in March  of this year.  She presented to the [facility], ED today with abdominal/back pain, nausea, vomiting, jaundice with total bilirubin of 6.9.  MRI abdomen/MRCP today revealed:  1. There is mild dilatation of the intrahepatic bile ducts to the right lobe of liver, increased from previous exam. New moderate to severe dilatation of the intrahepatic bile ducts to the left lobe of liver. Dilated bile ducts converge within the central liver. Suspect underlying malignant stricture secondary to the known large infiltrative tumor. The common bile duct is nondilated measuring up to 4 mm. No convincing evidence for choledocholithiasis. 2. Liver metastases are again noted. The dominant mass within the posterior right hepatic lobe is stable in size from previous exam. 3. Index upper abdominal adenopathy is unchanged. 4. Metastatic lesion involving the L1 vertebra with mild superior endplate compression deformity is unchanged.  She is currently afebrile,  WBC 3.8, hemoglobin 9.7, platelets 158K, creatinine 0.49, PT 15.4, INR 1.2.  Request received from ED /oncology to evaluate patient for possible Rebecca Bullock with biliary drain placement.  GI follow-up pending(seen in March by Dr. Loreta Bullock).  Latest imaging studies have been reviewed by Dr. Grace Isaac.  Details/risks of Rebecca Bullock with biliary drain placement, including but not limited to, internal bleeding, infection, injury to adjacent structures discussed with patient and daughter with their understanding/consent.  Will await GI input before proceeding with case but tent place on our schedule for 5/8.    Electronically Signed: D. Jeananne Rama, Rebecca Bullock 10/02/2022, 4:10 PM   I spent a total of 25 Minutes at the the patient's bedside AND on the patient's hospital floor or unit, greater than 50% of which was counseling/coordinating Bullock for possible percutaneous transhepatic cholangiogram with biliary drain placement    Patient ID: Rebecca Bullock, female   DOB: 07/11/1965, 57 y.o.   MRN:  [id number]

## 2022-10-02 NOTE — Consult Note (Signed)
Reason for Consult: Obstructive jaundice Referring Physician: Hospital team  Rebecca Bullock is an 57 y.o. female.  HPI: Patient seen and examined and her case extensively discussed with her significant other and her hospital computer chart reviewed as well as her MRI and her case discussed with interventional radiology and we answered all of her questions and we reviewed her history as well  Past Medical History:  Diagnosis Date   Colon cancer metastasized to liver (HCC) 05/05/2021   Family history of lung cancer    Family history of pancreatic cancer    Goals of care, counseling/discussion 05/05/2021   History of radiation therapy    Lumbar Spine- 07/12/22-07/25/22- Dr. Antony Blackbird    Past Surgical History:  Procedure Laterality Date   IR 3D INDEPENDENT WKST  03/15/2022   IR ANGIOGRAM SELECTIVE EACH ADDITIONAL VESSEL  03/15/2022   IR ANGIOGRAM SELECTIVE EACH ADDITIONAL VESSEL  03/15/2022   IR ANGIOGRAM SELECTIVE EACH ADDITIONAL VESSEL  03/15/2022   IR ANGIOGRAM SELECTIVE EACH ADDITIONAL VESSEL  03/29/2022   IR ANGIOGRAM SELECTIVE EACH ADDITIONAL VESSEL  03/29/2022   IR ANGIOGRAM VISCERAL SELECTIVE  03/15/2022   IR ANGIOGRAM VISCERAL SELECTIVE  03/29/2022   IR EMBO ARTERIAL NOT HEMORR HEMANG INC GUIDE ROADMAPPING  03/15/2022   IR EMBO TUMOR ORGAN ISCHEMIA INFARCT INC GUIDE ROADMAPPING  03/29/2022   IR IMAGING GUIDED PORT INSERTION  05/11/2021   IR RADIOLOGIST EVAL & MGMT  02/15/2022   IR RADIOLOGIST EVAL & MGMT  04/23/2022   IR RADIOLOGIST EVAL & MGMT  06/25/2022   IR US GUIDE VASC ACCESS RIGHT  03/29/2022   LAPAROSCOPY N/A 08/21/2022   Procedure: LAPAROSCOPIC LOOP ILEOSTOMY;  Surgeon: Emelia Loron, MD;  Location: [facility];  Service: General;  Laterality: N/A;   uterine ablation      Family History  Problem Relation Age of Onset   Lung cancer Mother    Diabetes Maternal Grandmother    Pancreatic cancer Maternal Grandfather    Multiple sclerosis Maternal Aunt    Alcohol abuse  Maternal Uncle     Social History:  reports that she has never smoked. She has never used smokeless tobacco. She reports that she does not currently use alcohol. She reports that she does not currently use drugs.  Allergies:  Allergies  Allergen Reactions   Augmentin [Amoxicillin-Pot Clavulanate] Diarrhea and Other (See Comments)    Extreme diarrhea, abdominal pain.   Irinotecan Other (See Comments)    Flushing, tingling, visual disturbance   Metronidazole Diarrhea and Other (See Comments)   Vancomycin Rash and Other (See Comments)    "Red Man Syndrome"   Latex Rash   Tape Rash   Wound Dressing Adhesive Rash    Medications: I have reviewed the patient's current medications.  Results for orders placed or performed during the hospital encounter of 10/02/22 (from the past 48 hour(s))  Protime-INR     Status: Abnormal   Collection Time: 10/02/22  2:02 PM  Result Value Ref Range   Prothrombin Time 15.4 (H) 11.4 - 15.2 seconds   INR 1.2 0.8 - 1.2    Comment: (NOTE) INR goal varies based on device and disease states. Performed at [facility], [address], [place], [place] [address]   Comprehensive metabolic panel     Status: Abnormal   Collection Time: 10/02/22  2:02 PM  Result Value Ref Range   Sodium 138 135 - 145 mmol/L   Potassium 2.7 (LL) 3.5 - 5.1 mmol/L    Comment: CRITICAL  RESULT CALLED TO, READ BACK BY AND VERIFIED WITH A.WILSON, RN AT 1557 ON 05.07.24 BY N.THOMPSON    Chloride 108 98 - 111 mmol/L   CO2 23 22 - 32 mmol/L   Glucose, Bld 161 (H) 70 - 99 mg/dL    Comment: Glucose reference range applies only to samples taken after fasting for at least 8 hours.   BUN <5 (L) 6 - 20 mg/dL   Creatinine, Ser 1.61 (L) 0.44 - 1.00 mg/dL   Calcium 8.0 (L) 8.9 - 10.3 mg/dL   Total Protein 6.2 (L) 6.5 - 8.1 g/dL   Albumin 2.5 (L) 3.5 - 5.0 g/dL   AST 096 (H) 15 - 41 U/L   ALT 60 (H) 0 - 44 U/L   Alkaline Phosphatase 654 (H) 38 - 126 U/L   Total  Bilirubin 6.3 (H) 0.3 - 1.2 mg/dL   GFR, Estimated >04 >54 mL/min    Comment: (NOTE) Calculated using the CKD-EPI Creatinine Equation (2021)    Anion gap 7 5 - 15    Comment: Performed at [facility], [address], [place], [place] [address]  CBC     Status: Abnormal   Collection Time: 10/02/22  2:02 PM  Result Value Ref Range   WBC 3.8 (L) 4.0 - 10.5 K/uL   RBC 3.31 (L) 3.87 - 5.11 MIL/uL   Hemoglobin 9.7 (L) 12.0 - 15.0 g/dL   HCT 91.4 (L) 78.2 - 95.6 %   MCV 94.9 80.0 - 100.0 fL   MCH 29.3 26.0 - 34.0 pg   MCHC 30.9 30.0 - 36.0 g/dL   RDW 21.3 (H) 08.6 - 57.8 %   Platelets 158 150 - 400 K/uL   nRBC 0.0 0.0 - 0.2 %    Comment: Performed at [facility], [address] [place], [place] [address]  Magnesium     Status: Abnormal   Collection Time: 10/02/22  4:36 PM  Result Value Ref Range   Magnesium 1.6 (L) 1.7 - 2.4 mg/dL    Comment: Performed at [facility], [address], [place], [place] [address]    MR 3D Recon At Scanner  Result Date: 10/02/2022 CLINICAL DATA:  Evaluate bile duct dilatation. Metastatic colon cancer EXAM: MRI ABDOMEN WITHOUT AND WITH CONTRAST (INCLUDING MRCP) TECHNIQUE: Multiplanar multisequence MR imaging of the abdomen was performed both before and after the administration of intravenous contrast. Heavily T2-weighted images of the biliary and pancreatic ducts were obtained, and three-dimensional MRCP images were rendered by post processing. CONTRAST:  7mL GADAVIST GADOBUTROL 1 MMOL/ML IV SOLN COMPARISON:  09/04/2022 FINDINGS: Lower chest: No acute findings. Hepatobiliary: Liver metastases are again noted. Dominant mass within the posterior right hepatic lobe measures 9.4 by 9.7 by 9.5 cm (volume = 450 cm^3), image 14/19. On the previous exam this measured 10.3 x 10.2 by 8.7 cm (volume = 480 cm^3). Lesion in the lateral segment of left hepatic lobe measures 0.8 cm, image 21/19. Unchanged from previous  exam. Cyst within the inferior right lobe of liver unchanged measuring 2.1 cm, image 25/4. Multiple stones are identified within the gallbladder. The largest stone is in the gallbladder neck measuring 1.7 cm. The gallbladder wall appears diffusely edematous measuring 7 mm, image 11/3. There is mild dilatation of the intrahepatic bile ducts to the right lobe of liver, increased from previous exam. New moderate to severe dilatation of the intrahepatic bile ducts to the left lobe of liver. Dilated bile ducts converge within the central liver.  Suspect underlying malignant stricture secondary to infiltrative tumor. The common bile duct is nondilated measuring up to 4 mm. No convincing evidence for choledocholithiasis. Pancreas: No mass, inflammatory changes, or other parenchymal abnormality identified. Spleen: Stable 1.5 cm splenic lesion which is favored to represent a benign abnormality. Adrenals/Urinary Tract: Normal adrenal glands. Small cyst within inferior pole of left kidney is unchanged. No follow-up imaging recommended. No hydronephrosis. Stomach/Bowel: Stomach appears normal. Right abdominal loop ileostomy. No dilated bowel loops identified. Vascular/Lymphatic: Normal caliber of the abdominal aorta. Portal vein, portal venous confluence and SMV remain patent. Index upper abdominal adenopathy is again seen. Enlarged portacaval lymph node measures 1.6 cm short axis, image 16/5. Unchanged from previous exam. Aortocaval lymph node measures 1.2 cm, image 30/5. Previously 1.3 cm. Other:  No significant free fluid or fluid collections. Musculoskeletal: Metastatic lesion involving the L1 vertebra with mild superior endplate compression deformity is unchanged. IMPRESSION: 1. There is mild dilatation of the intrahepatic bile ducts to the right lobe of liver, increased from previous exam. New moderate to severe dilatation of the intrahepatic bile ducts to the left lobe of liver. Dilated bile ducts converge within the  central liver. Suspect underlying malignant stricture secondary to the known large infiltrative tumor. The common bile duct is nondilated measuring up to 4 mm. No convincing evidence for choledocholithiasis. 2. Liver metastases are again noted. The dominant mass within the posterior right hepatic lobe is stable in size from previous exam. 3. Index upper abdominal adenopathy is unchanged. 4. Metastatic lesion involving the L1 vertebra with mild superior endplate compression deformity is unchanged. Electronically Signed   By: Signa Kell M.D.   On: 10/02/2022 05:38   MR ABDOMEN MRCP W WO CONTAST  Result Date: 10/02/2022 CLINICAL DATA:  Evaluate bile duct dilatation. Metastatic colon cancer EXAM: MRI ABDOMEN WITHOUT AND WITH CONTRAST (INCLUDING MRCP) TECHNIQUE: Multiplanar multisequence MR imaging of the abdomen was performed both before and after the administration of intravenous contrast. Heavily T2-weighted images of the biliary and pancreatic ducts were obtained, and three-dimensional MRCP images were rendered by post processing. CONTRAST:  7mL GADAVIST GADOBUTROL 1 MMOL/ML IV SOLN COMPARISON:  09/04/2022 FINDINGS: Lower chest: No acute findings. Hepatobiliary: Liver metastases are again noted. Dominant mass within the posterior right hepatic lobe measures 9.4 by 9.7 by 9.5 cm (volume = 450 cm^3), image 14/19. On the previous exam this measured 10.3 x 10.2 by 8.7 cm (volume = 480 cm^3). Lesion in the lateral segment of left hepatic lobe measures 0.8 cm, image 21/19. Unchanged from previous exam. Cyst within the inferior right lobe of liver unchanged measuring 2.1 cm, image 25/4. Multiple stones are identified within the gallbladder. The largest stone is in the gallbladder neck measuring 1.7 cm. The gallbladder wall appears diffusely edematous measuring 7 mm, image 11/3. There is mild dilatation of the intrahepatic bile ducts to the right lobe of liver, increased from previous exam. New moderate to severe  dilatation of the intrahepatic bile ducts to the left lobe of liver. Dilated bile ducts converge within the central liver. Suspect underlying malignant stricture secondary to infiltrative tumor. The common bile duct is nondilated measuring up to 4 mm. No convincing evidence for choledocholithiasis. Pancreas: No mass, inflammatory changes, or other parenchymal abnormality identified. Spleen: Stable 1.5 cm splenic lesion which is favored to represent a benign abnormality. Adrenals/Urinary Tract: Normal adrenal glands. Small cyst within inferior pole of left kidney is unchanged. No follow-up imaging recommended. No hydronephrosis. Stomach/Bowel: Stomach appears normal. Right abdominal loop ileostomy. No  dilated bowel loops identified. Vascular/Lymphatic: Normal caliber of the abdominal aorta. Portal vein, portal venous confluence and SMV remain patent. Index upper abdominal adenopathy is again seen. Enlarged portacaval lymph node measures 1.6 cm short axis, image 16/5. Unchanged from previous exam. Aortocaval lymph node measures 1.2 cm, image 30/5. Previously 1.3 cm. Other:  No significant free fluid or fluid collections. Musculoskeletal: Metastatic lesion involving the L1 vertebra with mild superior endplate compression deformity is unchanged. IMPRESSION: 1. There is mild dilatation of the intrahepatic bile ducts to the right lobe of liver, increased from previous exam. New moderate to severe dilatation of the intrahepatic bile ducts to the left lobe of liver. Dilated bile ducts converge within the central liver. Suspect underlying malignant stricture secondary to the known large infiltrative tumor. The common bile duct is nondilated measuring up to 4 mm. No convincing evidence for choledocholithiasis. 2. Liver metastases are again noted. The dominant mass within the posterior right hepatic lobe is stable in size from previous exam. 3. Index upper abdominal adenopathy is unchanged. 4. Metastatic lesion involving the  L1 vertebra with mild superior endplate compression deformity is unchanged. Electronically Signed   By: Signa Kell M.D.   On: 10/02/2022 05:38    ROS negative except above Blood pressure 118/73, pulse 65, temperature 98 F (36.7 C), temperature source Oral, resp. rate 17, height 5\' 1"  (1.549 m), weight 75.3 kg, SpO2 99 %. Physical Exam vital signs stable afebrile no acute distress abdomen is soft nontender ostomy in place  Assessment/Plan: Obstructive jaundice due to metastatic colon cancer Plan: The risk benefits and methods of ERCP and possible stenting versus interventional radiology going through her side and percutaneously proceeding was extensively discussed with the patient and her significant other and based on the location of this blockage I believe interventional radiology has a much higher success rate of being successful although internalization of their stent may take multiple visits and although I offered to try on Thursday after talking to interventional radiology they think my success rate will probably be too low and probably not worth the effort and they can probably proceed tomorrow and I will be on standby to help as needed and interventional radiology will call me if they think I can be of some assistance endoscopically  Valinda Fedie E 10/02/2022, 5:37 PM

## 2022-10-02 NOTE — ED Provider Notes (Signed)
Rebecca Bullock   CSN: [id number] Arrival date & time: 10/02/22  1258     History  Chief Complaint  Patient presents with   Emesis    Rebecca Bullock is a 57 y.o. female.   Emesis Patient with colon cancer metastatic to liver.  Has had increasing bilirubin and abdominal pain.  Had MRI done yesterday that showed intrahepatic blockage.  Reviewed oncology Bullock.  Plan for admission to the hospital and IR and GI consults.  Had seen Dr. Loreta Ave previously from GI.  Discussed with her and has not followed up in the office and would become an unassigned patient.  Patient with abdominal pain has had vomiting.  On Dilaudid orally at home.    Past Medical History:  Diagnosis Date   Colon cancer metastasized to liver [facility]) 05/05/2021   Family history of lung cancer    Family history of pancreatic cancer    Goals of care, counseling/discussion 05/05/2021   History of radiation therapy    Lumbar Spine- 07/12/22-07/25/22- Dr. Antony Blackbird    Home Medications Prior to Admission medications   Medication Sig Start Date End Date Taking? Authorizing Provider  baclofen (LIORESAL) 10 MG tablet Take 1 tablet (10 mg total) by mouth 3 (three) times daily. 08/28/22   Josph Macho, MD  doxycycline (VIBRA-TABS) 100 MG tablet Take 1 tablet (100 mg total) by mouth 2 (two) times daily. 09/25/22   Josph Macho, MD  famotidine (PEPCID) 40 MG tablet Take 1 tablet (40 mg total) by mouth 2 (two) times daily. 08/07/22   Josph Macho, MD  fruquintinib (FRUZAQLA) 1 MG capsule Take 3 capsules (3 mg total) by mouth daily. Take for 21 days on, then off for 7 days. Repeat every 28 days. 09/25/22   Josph Macho, MD  HYDROmorphone (DILAUDID) 4 MG tablet Take 1 tablet (4 mg total) by mouth every 2 (two) hours as needed for severe pain. Patient taking differently: Take 4 mg by mouth every 4 (four) hours as needed for severe pain. Per Palliative Care: Take every  4-6 hour prn. 09/12/22   Erenest Blank, NP  loperamide (IMODIUM A-D) 2 MG tablet Take 2 mg by mouth 4 (four) times daily as needed for diarrhea or loose stools. Patient not taking: Reported on 08/28/2022    [provider]  LORazepam (ATIVAN) 0.5 MG tablet Place 1 tablet (0.5 mg total) under the tongue every 4 (four) hours as needed for anxiety. Patient not taking: Reported on 09/25/2022 09/21/22   Josph Macho, MD  megestrol (MEGACE) 400 MG/10ML suspension Take 10 mLs (400 mg total) by mouth 2 (two) times daily. 09/21/22   Josph Macho, MD  meloxicam (MOBIC) 15 MG tablet Take 1 tablet (15 mg total) by mouth daily. 09/24/22   Pickenpack-Cousar, Arty Baumgartner, NP  morphine (MS CONTIN) 30 MG 12 hr tablet Take 1 tablet (30 mg total) by mouth every 12 (twelve) hours. 09/21/22   Josph Macho, MD  OLANZapine (ZYPREXA) 10 MG tablet Take 1 tablet (10 mg total) by mouth at bedtime. 09/21/22   Josph Macho, MD  ondansetron (ZOFRAN) 4 MG tablet Take 1 tablet (4 mg total) by mouth every 6 (six) hours. Patient not taking: Reported on 09/25/2022 09/04/22   Netta Corrigan, PA-C  prochlorperazine (COMPAZINE) 10 MG tablet Take 1 tablet (10 mg total) by mouth every 6 (six) hours as needed for nausea or vomiting. Patient not taking: Reported  on 08/28/2022 07/11/22   Josph Macho, MD  scopolamine (TRANSDERM-SCOP) 1 MG/3DAYS Place one patch behind the ear every three days. 09/10/22   Josph Macho, MD      Allergies    Augmentin [amoxicillin-pot clavulanate], Irinotecan, Metronidazole, Vancomycin, Latex, Tape, and Wound dressing adhesive    Review of Systems   Review of Systems  Gastrointestinal:  Positive for vomiting.    Physical Exam Updated Vital Signs BP 128/72 (BP Location: Left Arm)   Pulse 64   Temp (!) 97.5 F (36.4 C) (Oral)   Resp 16   Ht 5\' 1"  (1.549 m)   Wt 75.3 kg   SpO2 98%   BMI 31.37 kg/m  Physical Exam Vitals and nursing Bullock reviewed.  Cardiovascular:     Rate and  Rhythm: Regular rhythm.  Abdominal:     Tenderness: There is abdominal tenderness.     Comments: Mild upper abdominal tenderness.  Ileostomy right upper quadrant.  Musculoskeletal:        General: Tenderness present.  Skin:    Capillary Refill: Capillary refill takes less than 2 seconds.  Neurological:     Mental Status: She is alert and oriented to person, place, and time.     ED Results / Procedures / Treatments   Labs (all labs ordered are listed, but only abnormal results are displayed) Labs Reviewed  CBC - Abnormal; Notable for the following components:      Result Value   WBC 3.8 (*)    RBC 3.31 (*)    Hemoglobin 9.7 (*)    HCT 31.4 (*)    RDW 18.4 (*)    All other components within normal limits  PROTIME-INR  COMPREHENSIVE METABOLIC PANEL    EKG None  Radiology MR 3D Recon At Scanner  Result Date: 10/02/2022 CLINICAL DATA:  Evaluate bile duct dilatation. Metastatic colon cancer EXAM: MRI ABDOMEN WITHOUT AND WITH CONTRAST (INCLUDING MRCP) TECHNIQUE: Multiplanar multisequence MR imaging of the abdomen was performed both before and after the administration of intravenous contrast. Heavily T2-weighted images of the biliary and pancreatic ducts were obtained, and three-dimensional MRCP images were rendered by post processing. CONTRAST:  7mL GADAVIST GADOBUTROL 1 MMOL/ML IV SOLN COMPARISON:  09/04/2022 FINDINGS: Lower chest: No acute findings. Hepatobiliary: Liver metastases are again noted. Dominant mass within the posterior right hepatic lobe measures 9.4 by 9.7 by 9.5 cm (volume = 450 cm^3), image 14/19. On the previous exam this measured 10.3 x 10.2 by 8.7 cm (volume = 480 cm^3). Lesion in the lateral segment of left hepatic lobe measures 0.8 cm, image 21/19. Unchanged from previous exam. Cyst within the inferior right lobe of liver unchanged measuring 2.1 cm, image 25/4. Multiple stones are identified within the gallbladder. The largest stone is in the gallbladder neck  measuring 1.7 cm. The gallbladder wall appears diffusely edematous measuring 7 mm, image 11/3. There is mild dilatation of the intrahepatic bile ducts to the right lobe of liver, increased from previous exam. New moderate to severe dilatation of the intrahepatic bile ducts to the left lobe of liver. Dilated bile ducts converge within the central liver. Suspect underlying malignant stricture secondary to infiltrative tumor. The common bile duct is nondilated measuring up to 4 mm. No convincing evidence for choledocholithiasis. Pancreas: No mass, inflammatory changes, or other parenchymal abnormality identified. Spleen: Stable 1.5 cm splenic lesion which is favored to represent a benign abnormality. Adrenals/Urinary Tract: Normal adrenal glands. Small cyst within inferior pole of left kidney is unchanged. No follow-up  imaging recommended. No hydronephrosis. Stomach/Bowel: Stomach appears normal. Right abdominal loop ileostomy. No dilated bowel loops identified. Vascular/Lymphatic: Normal caliber of the abdominal aorta. Portal vein, portal venous confluence and SMV remain patent. Index upper abdominal adenopathy is again seen. Enlarged portacaval lymph node measures 1.6 cm short axis, image 16/5. Unchanged from previous exam. Aortocaval lymph node measures 1.2 cm, image 30/5. Previously 1.3 cm. Other:  No significant free fluid or fluid collections. Musculoskeletal: Metastatic lesion involving the L1 vertebra with mild superior endplate compression deformity is unchanged. IMPRESSION: 1. There is mild dilatation of the intrahepatic bile ducts to the right lobe of liver, increased from previous exam. New moderate to severe dilatation of the intrahepatic bile ducts to the left lobe of liver. Dilated bile ducts converge within the central liver. Suspect underlying malignant stricture secondary to the known large infiltrative tumor. The common bile duct is nondilated measuring up to 4 mm. No convincing evidence for  choledocholithiasis. 2. Liver metastases are again noted. The dominant mass within the posterior right hepatic lobe is stable in size from previous exam. 3. Index upper abdominal adenopathy is unchanged. 4. Metastatic lesion involving the L1 vertebra with mild superior endplate compression deformity is unchanged. Electronically Signed   By: Signa Kell M.D.   On: 10/02/2022 05:38   MR ABDOMEN MRCP W WO CONTAST  Result Date: 10/02/2022 CLINICAL DATA:  Evaluate bile duct dilatation. Metastatic colon cancer EXAM: MRI ABDOMEN WITHOUT AND WITH CONTRAST (INCLUDING MRCP) TECHNIQUE: Multiplanar multisequence MR imaging of the abdomen was performed both before and after the administration of intravenous contrast. Heavily T2-weighted images of the biliary and pancreatic ducts were obtained, and three-dimensional MRCP images were rendered by post processing. CONTRAST:  7mL GADAVIST GADOBUTROL 1 MMOL/ML IV SOLN COMPARISON:  09/04/2022 FINDINGS: Lower chest: No acute findings. Hepatobiliary: Liver metastases are again noted. Dominant mass within the posterior right hepatic lobe measures 9.4 by 9.7 by 9.5 cm (volume = 450 cm^3), image 14/19. On the previous exam this measured 10.3 x 10.2 by 8.7 cm (volume = 480 cm^3). Lesion in the lateral segment of left hepatic lobe measures 0.8 cm, image 21/19. Unchanged from previous exam. Cyst within the inferior right lobe of liver unchanged measuring 2.1 cm, image 25/4. Multiple stones are identified within the gallbladder. The largest stone is in the gallbladder neck measuring 1.7 cm. The gallbladder wall appears diffusely edematous measuring 7 mm, image 11/3. There is mild dilatation of the intrahepatic bile ducts to the right lobe of liver, increased from previous exam. New moderate to severe dilatation of the intrahepatic bile ducts to the left lobe of liver. Dilated bile ducts converge within the central liver. Suspect underlying malignant stricture secondary to infiltrative  tumor. The common bile duct is nondilated measuring up to 4 mm. No convincing evidence for choledocholithiasis. Pancreas: No mass, inflammatory changes, or other parenchymal abnormality identified. Spleen: Stable 1.5 cm splenic lesion which is favored to represent a benign abnormality. Adrenals/Urinary Tract: Normal adrenal glands. Small cyst within inferior pole of left kidney is unchanged. No follow-up imaging recommended. No hydronephrosis. Stomach/Bowel: Stomach appears normal. Right abdominal loop ileostomy. No dilated bowel loops identified. Vascular/Lymphatic: Normal caliber of the abdominal aorta. Portal vein, portal venous confluence and SMV remain patent. Index upper abdominal adenopathy is again seen. Enlarged portacaval lymph node measures 1.6 cm short axis, image 16/5. Unchanged from previous exam. Aortocaval lymph node measures 1.2 cm, image 30/5. Previously 1.3 cm. Other:  No significant free fluid or fluid collections. Musculoskeletal: Metastatic lesion  involving the L1 vertebra with mild superior endplate compression deformity is unchanged. IMPRESSION: 1. There is mild dilatation of the intrahepatic bile ducts to the right lobe of liver, increased from previous exam. New moderate to severe dilatation of the intrahepatic bile ducts to the left lobe of liver. Dilated bile ducts converge within the central liver. Suspect underlying malignant stricture secondary to the known large infiltrative tumor. The common bile duct is nondilated measuring up to 4 mm. No convincing evidence for choledocholithiasis. 2. Liver metastases are again noted. The dominant mass within the posterior right hepatic lobe is stable in size from previous exam. 3. Index upper abdominal adenopathy is unchanged. 4. Metastatic lesion involving the L1 vertebra with mild superior endplate compression deformity is unchanged. Electronically Signed   By: Signa Kell M.D.   On: 10/02/2022 05:38    Procedures Procedures     Medications Ordered in ED Medications  ondansetron (ZOFRAN) tablet 4 mg (has no administration in time range)    Or  ondansetron (ZOFRAN) injection 4 mg (has no administration in time range)  HYDROmorphone (DILAUDID) injection 1 mg (has no administration in time range)  naloxone [facility]) injection 0.4 mg (has no administration in time range)  HYDROmorphone (DILAUDID) injection 2 mg (2 mg Intravenous Given 10/02/22 1400)  ondansetron (ZOFRAN) injection 4 mg (4 mg Intravenous Given 10/02/22 1400)    ED Course/ Medical Decision Making/ A&P                             Medical Decision Making Amount and/or Complexity of Data Reviewed Labs: ordered.  Risk Prescription drug management. Decision regarding hospitalization.   Patient sent in for admission with obstructing liver mass.  Discussed with Dr. Robb Matar from internal medicine.  Discussed with Dr. Elmon Kirschner from interventional radiology.  Also discussed with Dr. Bryn Gulling from interventional radiology.  Have attempted to contact GI, however they have not called back.  No plans for intervention today.  Can eat and drink today and make n.p.o. at midnight.        Final Clinical Impression(s) / ED Diagnoses Final diagnoses:  Biliary obstruction due to cancer [facility])    Rx / DC Orders ED Discharge Orders     None         Benjiman Core, MD 10/02/22 1417

## 2022-10-02 NOTE — H&P (Signed)
History and Physical    Patient: Rebecca Bullock ZOX:[id number] DOB: 1965/08/04 DOA: 10/02/2022 DOS: the patient was seen and examined on 10/02/2022 PCP: Jomarie Longs, PA-C  Patient coming from: Home  Chief Complaint:  Chief Complaint  Patient presents with   Emesis   HPI: Rebecca Bullock is a 57 y.o. female with medical history significant of metastatic to liver colon cancer, iron deficiency anemia, endometrial mass, seasonal allergies, vitamin D deficiency admitted for bowel obstruction in March requiring ileostomy who presented to the emergency department with complaints of emesis and abnormal MRI imaging after she was called by her oncologist office.  She has been getting jaundiced over the past few days.  She has noticed that her stools are looking lighter.  She has had some pruritus.  Her abdominal and back pain had been worse as well.  She denied fever, chills, rhinorrhea, sore throat, wheezing or hemoptysis.  No chest pain, palpitations, diaphoresis, PND, orthopnea or pitting edema of the lower extremities.  No diarrhea, constipation, melena or hematochezia.  No flank pain, dysuria, frequency or hematuria.  No polyuria, polydipsia, polyphagia or blurred vision.   ED course: Initial vital signs were temperature 97.8 F, pulse 66, respiration 18, BP 136/78 mmHg O2 sat 100% on room air.  The patient received hydromorphone 2 mg IVP and ondansetron 4 mg IVP.  Lab work: CBC showed a white count of 3.9, hemoglobin 9.7 g/dL and platelets 811.  PT 15.4 and INR 1.2.  CMP showed a sodium 138, potassium 2.7, chloride 109 and CO2 23 mmol/L.  Calcium normalizes after correction.  Glucose 161, BUN less than 5, creatinine 0.38 and total bilirubin 6.3 mg/dL.  Total protein 6.2 and albumin 2.5 g/dL.  AST 102, ALT 60 and alkaline phosphatase 654.  Imaging: MRCP showing mild dilatation of the intrahepatic bile ducts to the right of lobe of the liver, increased from previous exam.  New moderate to severe dilatation  of the intrahepatic bile ducts to the left lobe of the liver.  Dilated bile ducts converge within the central level.  Suspect underlying malignant stricture secondary to the known large infiltrative tumor.  CBD is nondilated measuring up to 4 mm.  No convincing evidence of choledocholithiasis.  There are liver metastasis with a dominant mass within the posterior right hepatic lobe which is stable in size.  L1 metastasis with mild superior endplate compression deformities unchanged.  Review of Systems: As mentioned in the history of present illness. All other systems reviewed and are negative. Past Medical History:  Diagnosis Date   Colon cancer metastasized to liver (HCC) 05/05/2021   Family history of lung cancer    Family history of pancreatic cancer    Goals of care, counseling/discussion 05/05/2021   History of radiation therapy    Lumbar Spine- 07/12/22-07/25/22- Dr. Antony Blackbird   Past Surgical History:  Procedure Laterality Date   IR 3D INDEPENDENT WKST  03/15/2022   IR ANGIOGRAM SELECTIVE EACH ADDITIONAL VESSEL  03/15/2022   IR ANGIOGRAM SELECTIVE EACH ADDITIONAL VESSEL  03/15/2022   IR ANGIOGRAM SELECTIVE EACH ADDITIONAL VESSEL  03/15/2022   IR ANGIOGRAM SELECTIVE EACH ADDITIONAL VESSEL  03/29/2022   IR ANGIOGRAM SELECTIVE EACH ADDITIONAL VESSEL  03/29/2022   IR ANGIOGRAM VISCERAL SELECTIVE  03/15/2022   IR ANGIOGRAM VISCERAL SELECTIVE  03/29/2022   IR EMBO ARTERIAL NOT HEMORR HEMANG INC GUIDE ROADMAPPING  03/15/2022   IR EMBO TUMOR ORGAN ISCHEMIA INFARCT INC GUIDE ROADMAPPING  03/29/2022   IR IMAGING GUIDED PORT INSERTION  05/11/2021  IR RADIOLOGIST EVAL & MGMT  02/15/2022   IR RADIOLOGIST EVAL & MGMT  04/23/2022   IR RADIOLOGIST EVAL & MGMT  06/25/2022   IR US GUIDE VASC ACCESS RIGHT  03/29/2022   LAPAROSCOPY N/A 08/21/2022   Procedure: LAPAROSCOPIC LOOP ILEOSTOMY;  Surgeon: Emelia Loron, MD;  Location: [facility];  Service: General;  Laterality: N/A;   uterine ablation      Social History:  reports that she has never smoked. She has never used smokeless tobacco. She reports that she does not currently use alcohol. She reports that she does not currently use drugs.  Allergies  Allergen Reactions   Augmentin [Amoxicillin-Pot Clavulanate] Diarrhea and Other (See Comments)    Extreme diarrhea, abdominal pain.   Irinotecan Other (See Comments)    Flushing, tingling, visual disturbance   Metronidazole Diarrhea and Other (See Comments)   Vancomycin Rash and Other (See Comments)    "Red Man Syndrome"   Latex Rash   Tape Rash   Wound Dressing Adhesive Rash    Family History  Problem Relation Age of Onset   Lung cancer Mother    Diabetes Maternal Grandmother    Pancreatic cancer Maternal Grandfather    Multiple sclerosis Maternal Aunt    Alcohol abuse Maternal Uncle     Prior to Admission medications   Medication Sig Start Date End Date Taking? Authorizing Provider  baclofen (LIORESAL) 10 MG tablet Take 1 tablet (10 mg total) by mouth 3 (three) times daily. 08/28/22   Josph Macho, MD  doxycycline (VIBRA-TABS) 100 MG tablet Take 1 tablet (100 mg total) by mouth 2 (two) times daily. 09/25/22   Josph Macho, MD  famotidine (PEPCID) 40 MG tablet Take 1 tablet (40 mg total) by mouth 2 (two) times daily. 08/07/22   Josph Macho, MD  fruquintinib (FRUZAQLA) 1 MG capsule Take 3 capsules (3 mg total) by mouth daily. Take for 21 days on, then off for 7 days. Repeat every 28 days. 09/25/22   Josph Macho, MD  HYDROmorphone (DILAUDID) 4 MG tablet Take 1 tablet (4 mg total) by mouth every 2 (two) hours as needed for severe pain. Patient taking differently: Take 4 mg by mouth every 4 (four) hours as needed for severe pain. Per Palliative Care: Take every 4-6 hour prn. 09/12/22   Erenest Blank, NP  loperamide (IMODIUM A-D) 2 MG tablet Take 2 mg by mouth 4 (four) times daily as needed for diarrhea or loose stools. Patient not taking: Reported on 08/28/2022     [provider]  LORazepam (ATIVAN) 0.5 MG tablet Place 1 tablet (0.5 mg total) under the tongue every 4 (four) hours as needed for anxiety. Patient not taking: Reported on 09/25/2022 09/21/22   Josph Macho, MD  megestrol (MEGACE) 400 MG/10ML suspension Take 10 mLs (400 mg total) by mouth 2 (two) times daily. 09/21/22   Josph Macho, MD  meloxicam (MOBIC) 15 MG tablet Take 1 tablet (15 mg total) by mouth daily. 09/24/22   Pickenpack-Cousar, Arty Baumgartner, NP  morphine (MS CONTIN) 30 MG 12 hr tablet Take 1 tablet (30 mg total) by mouth every 12 (twelve) hours. 09/21/22   Josph Macho, MD  OLANZapine (ZYPREXA) 10 MG tablet Take 1 tablet (10 mg total) by mouth at bedtime. 09/21/22   Josph Macho, MD  ondansetron (ZOFRAN) 4 MG tablet Take 1 tablet (4 mg total) by mouth every 6 (six) hours. Patient not taking: Reported on 09/25/2022 09/04/22   Schuman,  Beverly Gust, PA-C  prochlorperazine (COMPAZINE) 10 MG tablet Take 1 tablet (10 mg total) by mouth every 6 (six) hours as needed for nausea or vomiting. Patient not taking: Reported on 08/28/2022 07/11/22   Josph Macho, MD  scopolamine (TRANSDERM-SCOP) 1 MG/3DAYS Place one patch behind the ear every three days. 09/10/22   Josph Macho, MD    Physical Exam: Vitals:   10/02/22 1304 10/02/22 1315  BP: 136/78 128/72  Pulse: 66 64  Resp: 18 16  Temp: 97.8 F (36.6 C) (!) 97.5 F (36.4 C)  TempSrc: Oral Oral  SpO2: 100% 98%  Weight: 75.3 kg   Height: 5\' 1"  (1.549 m)    Physical Exam Vitals and nursing note reviewed.  Constitutional:      Appearance: Normal appearance. She is obese.  HENT:     Head: Normocephalic.     Nose: No rhinorrhea.     Mouth/Throat:     Mouth: Mucous membranes are moist.     Pharynx: No posterior oropharyngeal erythema.  Eyes:     General: Scleral icterus present.     Pupils: Pupils are equal, round, and reactive to light.  Neck:     Vascular: No JVD.  Cardiovascular:     Rate and Rhythm: Normal rate  and regular rhythm.     Heart sounds: S1 normal and S2 normal.  Pulmonary:     Effort: Pulmonary effort is normal.     Breath sounds: Normal breath sounds. No wheezing, rhonchi or rales.  Abdominal:     General: Bowel sounds are normal. There is no distension.     Palpations: Abdomen is soft.     Tenderness: There is abdominal tenderness. There is right CVA tenderness. There is no guarding.  Musculoskeletal:     Cervical back: Neck supple.     Lumbar back: Tenderness present. Decreased range of motion.     Right lower leg: No edema.     Left lower leg: No edema.  Skin:    General: Skin is warm and dry.  Neurological:     General: No focal deficit present.     Mental Status: She is alert and oriented to person, place, and time.  Psychiatric:        Mood and Affect: Mood normal.        Behavior: Behavior normal.   Data Reviewed:  Results are pending, will review when available.  Assessment and Plan: Principal Problem:   Hyperbilirubinemia Secondary to:   Bile duct obstruction, intrahepatic In the setting:   Colon cancer metastasized to liver (HCC) Inpatient/MedSurg. IV fluids. Clear liquid diet. Analgesics as needed. Antiemetics as needed. Continue famotidine 20 mg p.o. twice daily. Follow CBC and CMP in a.m. Gastroenterology consulted. Interventional radiology will be evaluating as well. Will follow their recommendations.  Active Problems:   Pancytopenia (HCC) In the setting of: Ca/chemotherapy.   Platelets have improved. WBC and H&H remained stable. Following with Dr. Myna Hidalgo at the cancer center.    Hypokalemia Replacing. Magnesium was supplemented. Follow-up potassium level in the morning.      Advance Care Planning:   Code Status: Full Code   Consults: IR and [facility].  Family Communication:   Severity of Illness: The appropriate patient status for this patient is INPATIENT. Inpatient status is judged to be reasonable and necessary in order to  provide the required intensity of service to ensure the patient's safety. The patient's presenting symptoms, physical exam findings, and initial radiographic and laboratory data in  the context of their chronic comorbidities is felt to place them at high risk for further clinical deterioration. Furthermore, it is not anticipated that the patient will be medically stable for discharge from the hospital within 2 midnights of admission.   * I certify that at the point of admission it is my clinical judgment that the patient will require inpatient hospital care spanning beyond 2 midnights from the point of admission due to high intensity of service, high risk for further deterioration and high frequency of surveillance required.*  Author: Bobette Mo, MD 10/02/2022 1:44 PM  For on call review [email or web address].   This document was prepared using Dragon voice recognition software and may contain some unintended transcription errors.

## 2022-10-02 NOTE — ED Triage Notes (Addendum)
Patient has had stage 4 colon cancer for 1.5 years. Had an MRI yesterday and was told she has a liver blockage. Her bilirubin was elevated. Patient feels like her skin is jaundice. Has been vomiting for the last 5 days.

## 2022-10-02 NOTE — Progress Notes (Deleted)
Palliative Medicine [facility]  [phone] [phone]   Name: Rebecca Bullock Date: 10/02/2022 MRN: [id number]  DOB: Dec 04, 1965  Patient Care Team: Nolene Ebbs as PCP - General (Family Medicine) Josph Macho, MD as Medical Oncologist (Oncology) Pickenpack-Cousar, Arty Baumgartner, NP as Nurse Practitioner (Nurse Practitioner)   INTERVAL HISTORY: Rebecca Bullock is a 57 y.o. female with oncologic medical history including colon cancer (04/2021) with metastatic disease to liver and bone. Patient underwent a diverting loop ileostomy in march 2024. Palliative ask to see for symptom and pain management and goals of care   SOCIAL HISTORY:     reports that she has never smoked. She has never used smokeless tobacco. She reports that she does not currently use alcohol. She reports that she does not currently use drugs.  ADVANCE DIRECTIVES:  None on file  CODE STATUS: Full code  PAST MEDICAL HISTORY: Past Medical History:  Diagnosis Date   Colon cancer metastasized to liver (HCC) 05/05/2021   Family history of lung cancer    Family history of pancreatic cancer    Goals of care, counseling/discussion 05/05/2021   History of radiation therapy    Lumbar Spine- 07/12/22-07/25/22- Dr. Antony Blackbird    ALLERGIES:  is allergic to augmentin [amoxicillin-pot clavulanate], irinotecan, metronidazole, vancomycin, latex, tape, and wound dressing adhesive.  MEDICATIONS:  Current Outpatient Medications  Medication Sig Dispense Refill   baclofen (LIORESAL) 10 MG tablet Take 1 tablet (10 mg total) by mouth 3 (three) times daily. 60 each 2   doxycycline (VIBRA-TABS) 100 MG tablet Take 1 tablet (100 mg total) by mouth 2 (two) times daily. 20 tablet 0   famotidine (PEPCID) 40 MG tablet Take 1 tablet (40 mg total) by mouth 2 (two) times daily. 60 tablet 4   fruquintinib (FRUZAQLA) 1 MG capsule Take 3 capsules (3 mg total) by mouth daily. Take for 21 days on, then  off for 7 days. Repeat every 28 days. 63 capsule 4   HYDROmorphone (DILAUDID) 4 MG tablet Take 1 tablet (4 mg total) by mouth every 2 (two) hours as needed for severe pain. (Patient taking differently: Take 4 mg by mouth every 4 (four) hours as needed for severe pain. Per Palliative Care: Take every 4-6 hour prn.) 160 tablet 0   loperamide (IMODIUM A-D) 2 MG tablet Take 2 mg by mouth 4 (four) times daily as needed for diarrhea or loose stools. (Patient not taking: Reported on 08/28/2022)     LORazepam (ATIVAN) 0.5 MG tablet Place 1 tablet (0.5 mg total) under the tongue every 4 (four) hours as needed for anxiety. (Patient not taking: Reported on 09/25/2022) 60 tablet 0   megestrol (MEGACE) 400 MG/10ML suspension Take 10 mLs (400 mg total) by mouth 2 (two) times daily. 960 mL 2   meloxicam (MOBIC) 15 MG tablet Take 1 tablet (15 mg total) by mouth daily. 30 tablet 0   morphine (MS CONTIN) 30 MG 12 hr tablet Take 1 tablet (30 mg total) by mouth every 12 (twelve) hours. 60 tablet 0   OLANZapine (ZYPREXA) 10 MG tablet Take 1 tablet (10 mg total) by mouth at bedtime. 30 tablet 3   ondansetron (ZOFRAN) 4 MG tablet Take 1 tablet (4 mg total) by mouth every 6 (six) hours. (Patient not taking: Reported on 09/25/2022) 12 tablet 0   prochlorperazine (COMPAZINE) 10 MG tablet Take 1 tablet (10 mg total) by mouth every 6 (six) hours as needed for nausea or vomiting. (Patient  not taking: Reported on 08/28/2022) 60 tablet 0   scopolamine (TRANSDERM-SCOP) 1 MG/3DAYS Place one patch behind the ear every three days. 10 patch 3   Current Facility-Administered Medications  Medication Dose Route Frequency Provider Last Rate Last Admin   ondansetron (ZOFRAN) injection 8 mg  8 mg Intravenous Once Ennever, Rose Phi, MD       Facility-Administered Medications Ordered in Other Visits  Medication Dose Route Frequency Provider Last Rate Last Admin   atropine 1 MG/ML injection            heparin lock flush 100 unit/mL  500 Units  Intravenous Once Ennever, Rose Phi, MD       HYDROmorphone (DILAUDID) 4 MG/ML injection            ketorolac (TORADOL) 15 MG/ML injection            LORazepam (ATIVAN) 2 MG/ML injection            sodium chloride flush (NS) 0.9 % injection 10 mL  10 mL Intracatheter Once PRN Erenest Blank, NP       sodium chloride flush (NS) 0.9 % injection 10 mL  10 mL Intravenous PRN Josph Macho, MD   10 mL at 10/31/21 1258    VITAL SIGNS: There were no vitals taken for this visit. There were no vitals filed for this visit.  Estimated body mass index is 31.37 kg/m as calculated from the following:   Height as of 10/01/22: 5\' 1"  (1.549 m).   Weight as of 10/01/22: 166 lb (75.3 kg).   PERFORMANCE STATUS (ECOG) : 1 - Symptomatic but completely ambulatory  IMPRESSION:     1.   Pain Rebecca Bullock reports her pain is well controlled. She is tolerating regimen and drowsiness has improved. We discussed her regimen at length. Is not having to take the hydromorphone as often.    2.  Nausea/vomiting.  Controlled. States she has not thrown up in over 3 days.   3.  Decreased appetite Appetite is improving.   4.  Goals of Care   4/29- Rebecca Bullock has three adult children. She lives with her daughter Rebecca Bullock who works during the day and is at home during the night. Rebecca Bullock and boyfriend Rebecca Bullock check on patient intermittently throughout the day. While patient does not have documents in place as of yet, she requests that these individuals be involved in her care.   Rebecca Bullock relates not being ready to make a decision about her code status at this time. She is, however, open to education and expresses wanting to get this completed in the near future.   Rebecca Bullock current goal is to achieve better pain management, while maintaining as much alertness and physical function as possible.  We discussed Her current illness and what it means in the larger context of Her on-going co-morbidities. Natural disease  trajectory and expectations were discussed.  I discussed the importance of continued conversation with family and their medical providers regarding overall plan of care and treatment options, ensuring decisions are within the context of the patients values and GOCs.  PLAN: Ongoing symptom management support. Overall patient is doing much better. No changes to current regimen.  For pain:  Continue taking MS Contin 30 mg every 12 hours. Change Dilaudid to 4 mg every 4 hours as needed for severe pain. Change Baclofen from scheduled dosing to as-needed dosing. Instructed to take twice daily as needed for muscle spasms. Start Mobic daily. For nausea/vomiting/appetite: Pick-up  Megace today from pharmacy and begin. Continue taking Pepcid for dyspepsia. Continue Scopolamine patch. Continue Zyprexa at bedtime. Zofran and Compazine available as needed. Patient currently taking Ativan around-the-clock twice a day. Instructed to take Ativan only as needed if having more than 4 nausea/vomiting episodes in a day; no more than twice in a day. Education provided on MS Contin, Dilaudid, Baclofen, Zyprexa, and Ativan and their sedating effects. Instructed NOT to take these medications at the same time due to concerns for over-sedation and respiratory depression. Should take at minimum 2 hours apart if needed. Pain contract on file.   Follow-up visit scheduled for Thursday 5/9. Patient aware to call sooner for needs, questions, concerns.  Patient expressed understanding and was in agreement with this plan. She also understands that She can call the clinic at any time with any questions, concerns, or complaints.   Any controlled substances utilized were prescribed in the context of palliative care. PDMP has been reviewed.    Visit consisted of counseling and education dealing with the complex and emotionally intense issues of symptom management and palliative care in the setting of serious and potentially  life-threatening illness.Greater than 50%  of this time was spent counseling and coordinating care related to the above assessment and plan.  Willette Alma, AGPCNP-BC  Palliative Medicine Team/ Cancer Center  *Please note that this is a verbal dictation therefore any spelling or grammatical errors are due to the "Dragon Medical One" system interpretation.

## 2022-10-03 ENCOUNTER — Inpatient Hospital Stay (HOSPITAL_COMMUNITY): Payer: Medicaid Other

## 2022-10-03 DIAGNOSIS — E669 Obesity, unspecified: Secondary | ICD-10-CM

## 2022-10-03 DIAGNOSIS — K831 Obstruction of bile duct: Secondary | ICD-10-CM

## 2022-10-03 DIAGNOSIS — C787 Secondary malignant neoplasm of liver and intrahepatic bile duct: Secondary | ICD-10-CM

## 2022-10-03 DIAGNOSIS — C801 Malignant (primary) neoplasm, unspecified: Secondary | ICD-10-CM

## 2022-10-03 DIAGNOSIS — C189 Malignant neoplasm of colon, unspecified: Secondary | ICD-10-CM

## 2022-10-03 HISTORY — PX: IR INT EXT BILIARY DRAIN WITH CHOLANGIOGRAM: IMG6044

## 2022-10-03 LAB — CBC
HCT: 32.7 % — ABNORMAL LOW (ref 36.0–46.0)
Hemoglobin: 10.1 g/dL — ABNORMAL LOW (ref 12.0–15.0)
MCH: 29.6 pg (ref 26.0–34.0)
MCHC: 30.9 g/dL (ref 30.0–36.0)
MCV: 95.9 fL (ref 80.0–100.0)
Platelets: 163 10*3/uL (ref 150–400)
RBC: 3.41 MIL/uL — ABNORMAL LOW (ref 3.87–5.11)
RDW: 18.6 % — ABNORMAL HIGH (ref 11.5–15.5)
WBC: 3.6 10*3/uL — ABNORMAL LOW (ref 4.0–10.5)
nRBC: 0 % (ref 0.0–0.2)

## 2022-10-03 LAB — COMPREHENSIVE METABOLIC PANEL
ALT: 55 U/L — ABNORMAL HIGH (ref 0–44)
AST: 101 U/L — ABNORMAL HIGH (ref 15–41)
Albumin: 2.4 g/dL — ABNORMAL LOW (ref 3.5–5.0)
Alkaline Phosphatase: 641 U/L — ABNORMAL HIGH (ref 38–126)
Anion gap: 8 (ref 5–15)
BUN: <5 mg/dL — ABNORMAL LOW (ref 6–20)
CO2: 21 mmol/L — ABNORMAL LOW (ref 22–32)
Calcium: 7.8 mg/dL — ABNORMAL LOW (ref 8.9–10.3)
Chloride: 109 mmol/L (ref 98–111)
Creatinine, Ser: 0.37 mg/dL — ABNORMAL LOW (ref 0.44–1.00)
GFR, Estimated: >60 mL/min (ref >60)
Glucose, Bld: 96 mg/dL (ref 70–99)
Potassium: 3.6 mmol/L (ref 3.5–5.1)
Sodium: 138 mmol/L (ref 135–145)
Total Bilirubin: 6.9 mg/dL — ABNORMAL HIGH (ref 0.3–1.2)
Total Protein: 6.2 g/dL — ABNORMAL LOW (ref 6.5–8.1)

## 2022-10-03 MED ORDER — CHLORHEXIDINE GLUCONATE CLOTH 2 % EX PADS
6.0000 | MEDICATED_PAD | Freq: Every day | CUTANEOUS | Status: DC
  Administered 2022-10-03 – 2022-10-08 (×6): 6 via TOPICAL

## 2022-10-03 MED ORDER — SODIUM CHLORIDE 0.9% FLUSH: 10.0000 mL | INTRAVENOUS | Status: DC | PRN

## 2022-10-03 MED ORDER — HYDROMORPHONE HCL 2 MG/ML IJ SOLN
2.0000 mg | INTRAMUSCULAR | Status: DC | PRN
  Administered 2022-10-03 – 2022-10-04 (×7): 2 mg via INTRAVENOUS
  Filled 2022-10-03 (×7): qty 1

## 2022-10-03 MED ORDER — DIPHENHYDRAMINE HCL 50 MG/ML IJ SOLN
INTRAMUSCULAR | Status: AC | PRN
  Administered 2022-10-03: 25 mg via INTRAVENOUS

## 2022-10-03 MED ORDER — IOHEXOL 300 MG/ML  SOLN
100.0000 mL | Freq: Once | INTRAMUSCULAR | Status: AC | PRN
  Administered 2022-10-03: 100 mL via INTRAVENOUS

## 2022-10-03 MED ORDER — MIDAZOLAM HCL 2 MG/2ML IJ SOLN
INTRAMUSCULAR | Status: AC
  Filled 2022-10-03: qty 4

## 2022-10-03 MED ORDER — HYDROMORPHONE HCL 2 MG/ML IJ SOLN
INTRAMUSCULAR | Status: AC
  Filled 2022-10-03: qty 1

## 2022-10-03 MED ORDER — MIDAZOLAM HCL 2 MG/2ML IJ SOLN
INTRAMUSCULAR | Status: AC | PRN
  Administered 2022-10-03 (×4): 1 mg via INTRAVENOUS

## 2022-10-03 MED ORDER — LIDOCAINE-EPINEPHRINE 1 %-1:100000 IJ SOLN
20.0000 mL | Freq: Once | INTRAMUSCULAR | Status: AC
  Administered 2022-10-03: 20 mL

## 2022-10-03 MED ORDER — FENTANYL CITRATE (PF) 100 MCG/2ML IJ SOLN
INTRAMUSCULAR | Status: AC | PRN
  Administered 2022-10-03 (×4): 50 ug via INTRAVENOUS

## 2022-10-03 MED ORDER — HYDROMORPHONE HCL 1 MG/ML IJ SOLN
INTRAMUSCULAR | Status: AC | PRN
  Administered 2022-10-03: 1 mg via INTRAVENOUS

## 2022-10-03 MED ORDER — SODIUM CHLORIDE 0.9% FLUSH
5.0000 mL | Freq: Three times a day (TID) | INTRAVENOUS | Status: DC
  Administered 2022-10-03 – 2022-10-08 (×15): 5 mL

## 2022-10-03 MED ORDER — SODIUM CHLORIDE (PF) 0.9 % IJ SOLN
INTRAMUSCULAR | Status: AC
  Filled 2022-10-03: qty 50

## 2022-10-03 MED ORDER — DIPHENHYDRAMINE HCL 50 MG/ML IJ SOLN
INTRAMUSCULAR | Status: AC
  Filled 2022-10-03: qty 1

## 2022-10-03 MED ORDER — IOHEXOL 300 MG/ML  SOLN
50.0000 mL | Freq: Once | INTRAMUSCULAR | Status: AC | PRN
  Administered 2022-10-03: 20 mL

## 2022-10-03 MED ORDER — FENTANYL CITRATE (PF) 100 MCG/2ML IJ SOLN
INTRAMUSCULAR | Status: AC
  Filled 2022-10-03: qty 4

## 2022-10-03 MED ORDER — LIDOCAINE-EPINEPHRINE 1 %-1:100000 IJ SOLN
INTRAMUSCULAR | Status: AC
  Filled 2022-10-03: qty 1

## 2022-10-03 NOTE — Progress Notes (Signed)
Rebecca Bullock was admitted yesterday.  She admitted because of biliary obstruction.  She had the MRI/MRCP yesterday.  This showed intrahepatic biliary obstruction.  She was admitted to try to relieve the obstruction.  Hopefully, IR will be able to do a percutaneous procedure to try to relieve the biliary obstruction.  This morning, her bilirubin is 6.9.  She has been through several lines of treatment for metastatic colon cancer.  We really do not have a lot of options left.  This morning, sodium is 138.  Potassium 3.6.  BUN less than 5 creatinine 0.37.  Calcium 7.8 with an albumin of 2.4.  Again, her bilirubin is 6.9.  Her white cell count is 3.6.  Hemoglobin 10.1.  Platelet count 163,000.  She is having her lower back discomfort secondary to metastatic disease.  Dilaudid seems to help this.  She has had no fever.  She has had no cough or shortness of breath.  There has been no nausea.  She has occasional vomiting.  There has been no bleeding.  Her vital signs show temperature of 98.4.  Pulse 62.  Blood pressure 112/73.  Weight is 166 pounds.  Head and neck exam shows no ocular or oral lesions.  She has some scleral icterus.  There is no adenopathy.  Lungs are clear.  Cardiac exam regular rate and rhythm.  Abdomen is soft.  Bowel sounds are present.  She has an ostomy that is functioning.  She has no hepatomegaly.  Extremity shows no clubbing, cyanosis or edema.  Neurological exam shows no focal neurological deficits.  Rebecca Bullock is a very nice 57 year old white female.  She had metastatic colon cancer.  She has extensive hepatic metastasis.  She has intrahepatic biliary obstruction.  Hopefully, IR will be able to alleviate the obstruction for [place].  As far as further therapy, we may have to consider systemic chemotherapy again.  She has been taking the fruquintinib but we really are not able to give her this given her bilirubin at the present level.  We probably need to have Palliative Care see her  to try to help with her pain issues. I know that she has seen them before.  I know all the staff on [address] will do a fantastic job with her.  Christin Bach, MD  Philippians 4:6

## 2022-10-03 NOTE — Procedures (Signed)
Pre procedural Dx: Obstructive Juandice Post procedural Dx: Same  Successful placement of a left hepatic approach transhepatic 10 Fr biliary drainage catheter with end coiled and locked within the duodenum.   Biliary drain connected to gravity bag.  EBL: Minimal  Complications: None immediate  Jay Kayden Amend, MD Pager #: [phone]    

## 2022-10-03 NOTE — TOC Initial Note (Signed)
Transition of Care [facility]) - Initial/Assessment Note    Patient Details  Name: Rebecca Bullock MRN: [id number] Date of Birth: 04/29/1966  Transition of Care [facility]) CM/SW Contact:    Durenda Guthrie, RN Phone Number: 10/03/2022, 12:48 PM  Clinical Narrative:                  Transition of Care [facility]) Department has reviewed patient and no TOC needs have been identified at this time. We will continue to monitor patient advancement through Interdisciplinary progressions and if new patient needs arise, please place a consult.   Barriers to Discharge: Continued Medical Work up   Patient Goals and CMS Choice            Expected Discharge Plan and Services                                              Prior Living Arrangements/Services                       Activities of Daily Living Home Assistive Devices/Equipment: None ADL Screening (condition at time of admission) Patient's cognitive ability adequate to safely complete daily activities?: Yes Is the patient deaf or have difficulty hearing?: No Does the patient have difficulty seeing, even when wearing glasses/contacts?: No Does the patient have difficulty concentrating, remembering, or making decisions?: No Patient able to express need for assistance with ADLs?: No Does the patient have difficulty dressing or bathing?: No Independently performs ADLs?: Yes (appropriate for developmental age) Does the patient have difficulty walking or climbing stairs?: No Weakness of Legs: None Weakness of Arms/Hands: None  Permission Sought/Granted                  Emotional Assessment              Admission diagnosis:  Bile duct obstruction, intrahepatic [K83.1] Biliary obstruction due to cancer (HCC) [K83.1, C80.1] Patient Active Problem List   Diagnosis Date Noted   Bile duct obstruction, intrahepatic 10/02/2022   Hypokalemia 10/02/2022   Hyperbilirubinemia 10/02/2022   Ileostomy stenosis (HCC)  09/14/2022   Irritant contact dermatitis associated with fecal stoma 09/04/2022   Colostomy complication (HCC) 09/04/2022   Obesity (BMI 30-39.9) 08/20/2022   Pancytopenia (HCC) 08/19/2022   Malignant ascending colon obstruction 08/18/2022   AKI (acute kidney injury) (HCC) 08/18/2022   Secondary malignant neoplasm of bone and bone marrow (HCC) 07/09/2022   Family history of pancreatic cancer 06/05/2021   Family history of lung cancer 06/05/2021   Colon cancer metastasized to liver (HCC) 05/05/2021   Goals of care, counseling/discussion 05/05/2021   Colon cancer (HCC) 05/03/2021   Endometrial thickening on ultrasound 05/03/2021   Endometrial mass 05/03/2021   Bilateral lower abdominal cramping 04/11/2021   History of uterine fibroid 04/11/2021   Post-menopausal bleeding 04/10/2021   Hiatal hernia 04/06/2021   IDA (iron deficiency anemia) 03/24/2021   Chronic cough 03/23/2021   Severe anemia 03/23/2021   SOB (shortness of breath) on exertion 03/23/2021   Symptomatic anemia 03/14/2021   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   SOB (shortness of breath) 03/13/2021   Lichenoid dermatitis 10/16/2019   Microscopic hematuria 01/26/2019   Fibroids 11/26/2018   Abnormal uterine bleeding 11/26/2018   Menorrhagia with regular cycle 11/19/2016   Perimenopausal 09/25/2016   Insomnia 09/25/2016   Seasonal allergic rhinitis 09/25/2016   Vitamin  D deficiency 09/25/2016   PCP:  Jomarie Longs, PA-C Pharmacy:   [facility] [place] [facility] [address] [place] [address] Phone: [phone] Fax: [phone]  [facility] [address] [place] [address] Phone: [phone] Fax: [phone]     Social Determinants of Health (SDOH) Social History: SDOH Screenings   Food Insecurity: No Food Insecurity (10/02/2022)  Housing: Low Risk  (10/02/2022)  Transportation Needs: No Transportation Needs  (10/02/2022)  Utilities: Not At Risk (10/02/2022)  Depression (PHQ2-9): Low Risk  (10/16/2021)  Financial Resource Strain: Medium Risk (05/18/2021)  Tobacco Use: Low Risk  (10/02/2022)   SDOH Interventions:     Readmission Risk Interventions     No data to display

## 2022-10-03 NOTE — Progress Notes (Signed)
[facility] Progress Note  Patient: Rebecca Bullock    ZOX:[id number]  DOA: 10/02/2022    Date of Service: the patient was seen and examined on 10/03/2022  Brief hospital course: 57 year old female with past medical history of colon cancer with metastases to liver and L1 vertebra, iron deficiency anemia, obesity with recent hospitalization 2 months ago for bowel obstruction status post ileostomy who presented to the emergency room on 5/7 with complaints of emesis and increased jaundice over the past few days.  Patient had been seen by her oncologist the day prior and underwent MRI which noted signs concerning for biliary/hepatic obstruction.  In the emergency room, MRCP done noting underlying malignant stricture caused by known infiltrative tumor.  Case discussed with GI and it was felt that interventional radiology approach would be superior versus ERCP.  Following CT scan, patient underwent successful placement of transhepatic biliary drainage catheter by left hepatic approach.  Assessment and Plan: Hyperbilirubinemia caused by antibiotic bile duct obstruction secondary to metastatic colon cancer: Appreciate GI/oncology/interventional radiology help.  Status post biliary drain placement.  Continue to follow numbers.  Advance diet.  Hypokalemia: Replace as needed  Obesity: Meets criteria BMI greater than 30      Body mass index is 31.37 kg/m.        Consultants: Gastroenterology Oncology Interventional radiology  Procedures: Status post biliary drainage catheter  Antimicrobials: Preop Levaquin x 1 dose 5/8  Code Status: Full code   Subjective: Patient complains of abdominal pain 10/10  Objective: Noted elevated blood pressures Vitals:   10/03/22 1619 10/03/22 1641  BP: (!) 186/98 (!) 144/83  Pulse: 94 95  Resp: 20   Temp:  99.4 F (37.4 C)  SpO2: 99% 98%    Intake/Output Summary (Last 24 hours) at 10/03/2022 1729 Last data filed at 10/03/2022 0234 Gross per 24 hour   Intake 923.31 ml  Output 2 ml  Net 921.31 ml   Filed Weights   10/02/22 1304  Weight: 75.3 kg   Body mass index is 31.37 kg/m.  Exam:  General: Alert and oriented x 3, no acute distress HEENT: Normocephalic atraumatic, mucous membranes slightly dry Cardiovascular: Regular rate and rhythm, S1-S2 Respiratory: Clear to auscultation bilaterally Abdomen: Soft, mildly diffusely tender, mild distention, hypoactive bowel sounds Musculoskeletal: No clubbing or cyanosis or edema Skin: Jaundiced Psychiatry: Appropriate, no evidence of psychoses Neurology: No focal deficits  Data Reviewed: Alkaline phosphatase 641, bilirubin of 6.9  Disposition:  Status is: Inpatient Remains inpatient appropriate because:    Anticipated discharge date: 5/11  Remaining issues to be resolved so that patient can be discharged:  -Clearance by interventional radiology -Improvement in obstructive jaundice   Family Communication: Daughter at bedside DVT Prophylaxis: SCDs Start: 10/02/22 1352    Author: Hollice Espy ,MD 10/03/2022 5:29 PM  To reach On-call, see care teams to locate the attending and reach out via [email or web address]. Between 7PM-7AM, please contact night-coverage If you still have difficulty reaching the attending provider, please page the [facility] (Director on Call) for [facility] on amion for assistance.

## 2022-10-03 NOTE — Plan of Care (Signed)

## 2022-10-04 ENCOUNTER — Inpatient Hospital Stay: Payer: Medicaid Other | Admitting: Nurse Practitioner

## 2022-10-04 DIAGNOSIS — C189 Malignant neoplasm of colon, unspecified: Secondary | ICD-10-CM | POA: Diagnosis not present

## 2022-10-04 DIAGNOSIS — Z515 Encounter for palliative care: Secondary | ICD-10-CM | POA: Diagnosis not present

## 2022-10-04 DIAGNOSIS — Z7189 Other specified counseling: Secondary | ICD-10-CM | POA: Diagnosis not present

## 2022-10-04 DIAGNOSIS — K831 Obstruction of bile duct: Secondary | ICD-10-CM | POA: Diagnosis not present

## 2022-10-04 DIAGNOSIS — F419 Anxiety disorder, unspecified: Secondary | ICD-10-CM

## 2022-10-04 DIAGNOSIS — E669 Obesity, unspecified: Secondary | ICD-10-CM | POA: Diagnosis not present

## 2022-10-04 LAB — COMPREHENSIVE METABOLIC PANEL
ALT: 56 U/L — ABNORMAL HIGH (ref 0–44)
AST: 105 U/L — ABNORMAL HIGH (ref 15–41)
Albumin: 2.4 g/dL — ABNORMAL LOW (ref 3.5–5.0)
Alkaline Phosphatase: 706 U/L — ABNORMAL HIGH (ref 38–126)
Anion gap: 8 (ref 5–15)
BUN: <5 mg/dL — ABNORMAL LOW (ref 6–20)
CO2: 22 mmol/L (ref 22–32)
Calcium: 8 mg/dL — ABNORMAL LOW (ref 8.9–10.3)
Chloride: 106 mmol/L (ref 98–111)
Creatinine, Ser: <0.3 mg/dL — ABNORMAL LOW (ref 0.44–1.00)
Glucose, Bld: 108 mg/dL — ABNORMAL HIGH (ref 70–99)
Potassium: 4.1 mmol/L (ref 3.5–5.1)
Sodium: 136 mmol/L (ref 135–145)
Total Bilirubin: 8 mg/dL — ABNORMAL HIGH (ref 0.3–1.2)
Total Protein: 6.3 g/dL — ABNORMAL LOW (ref 6.5–8.1)

## 2022-10-04 LAB — CBC
HCT: 33.3 % — ABNORMAL LOW (ref 36.0–46.0)
Hemoglobin: 10.3 g/dL — ABNORMAL LOW (ref 12.0–15.0)
MCH: 29.6 pg (ref 26.0–34.0)
MCHC: 30.9 g/dL (ref 30.0–36.0)
MCV: 95.7 fL (ref 80.0–100.0)
Platelets: 175 10*3/uL (ref 150–400)
RBC: 3.48 MIL/uL — ABNORMAL LOW (ref 3.87–5.11)
RDW: 19.2 % — ABNORMAL HIGH (ref 11.5–15.5)
WBC: 4.3 10*3/uL (ref 4.0–10.5)
nRBC: 0 % (ref 0.0–0.2)

## 2022-10-04 LAB — BODY FLUID CULTURE W GRAM STAIN

## 2022-10-04 MED ORDER — DIPHENHYDRAMINE HCL 50 MG/ML IJ SOLN: 12.5000 mg | Freq: Four times a day (QID) | INTRAMUSCULAR | Status: DC | PRN

## 2022-10-04 MED ORDER — NALOXONE HCL 0.4 MG/ML IJ SOLN: 0.4000 mg | INTRAMUSCULAR | Status: DC | PRN

## 2022-10-04 MED ORDER — SODIUM CHLORIDE 0.9% FLUSH: 9.0000 mL | INTRAVENOUS | Status: DC | PRN

## 2022-10-04 MED ORDER — DIPHENHYDRAMINE HCL 12.5 MG/5ML PO ELIX: 12.5000 mg | ORAL_SOLUTION | Freq: Four times a day (QID) | ORAL | Status: DC | PRN

## 2022-10-04 MED ORDER — ONDANSETRON HCL 4 MG/2ML IJ SOLN: 4.0000 mg | Freq: Four times a day (QID) | INTRAMUSCULAR | Status: DC | PRN

## 2022-10-04 MED ORDER — ENOXAPARIN SODIUM 40 MG/0.4ML IJ SOSY
40.0000 mg | PREFILLED_SYRINGE | INTRAMUSCULAR | Status: DC
  Administered 2022-10-04 – 2022-10-08 (×5): 40 mg via SUBCUTANEOUS
  Filled 2022-10-04 (×5): qty 0.4

## 2022-10-04 MED ORDER — HYDROMORPHONE 1 MG/ML IV SOLN
INTRAVENOUS | Status: DC
  Administered 2022-10-04: 30 mg via INTRAVENOUS
  Administered 2022-10-04: 4.7 mg via INTRAVENOUS
  Administered 2022-10-05: 3 mg via INTRAVENOUS
  Administered 2022-10-05: 1.2 mg via INTRAVENOUS
  Filled 2022-10-04: qty 30

## 2022-10-04 NOTE — Progress Notes (Signed)
Referring Physician(s): Ennever,P  Supervising Physician: Simonne Come  Patient Status:  [facility] - In-pt  Chief Complaint: Abdominal pain, metastatic colon cancer/biliary obstruction   Subjective: Pt still with epigastric discomfort; denies fever, resp issues, ,N/V, remains jaundiced; on dilaudid PCA   Allergies: Augmentin [amoxicillin-pot clavulanate], Irinotecan, Metronidazole, Vancomycin, Latex, Tape, and Wound dressing adhesive  Medications: Prior to Admission medications   Medication Sig Start Date End Date Taking? Authorizing Provider  baclofen (LIORESAL) 10 MG tablet Take 1 tablet (10 mg total) by mouth 3 (three) times daily. 08/28/22  Yes Ennever, Rose Phi, MD  doxycycline (VIBRA-TABS) 100 MG tablet Take 1 tablet (100 mg total) by mouth 2 (two) times daily. 09/25/22  Yes Josph Macho, MD  famotidine (PEPCID) 40 MG tablet Take 1 tablet (40 mg total) by mouth 2 (two) times daily. 08/07/22  Yes Ennever, Rose Phi, MD  HYDROmorphone (DILAUDID) 4 MG tablet Take 1 tablet (4 mg total) by mouth every 2 (two) hours as needed for severe pain. Patient taking differently: Take 4 mg by mouth every 4 (four) hours as needed for severe pain. Per Palliative Care: Take every 4-6 hour prn. 09/12/22  Yes Erenest Blank, NP  LORazepam (ATIVAN) 0.5 MG tablet Place 1 tablet (0.5 mg total) under the tongue every 4 (four) hours as needed for anxiety. 09/21/22  Yes Josph Macho, MD  megestrol (MEGACE) 400 MG/10ML suspension Take 10 mLs (400 mg total) by mouth 2 (two) times daily. 09/21/22  Yes Josph Macho, MD  meloxicam (MOBIC) 15 MG tablet Take 1 tablet (15 mg total) by mouth daily. 09/24/22  Yes Pickenpack-Cousar, Arty Baumgartner, NP  morphine (MS CONTIN) 30 MG 12 hr tablet Take 1 tablet (30 mg total) by mouth every 12 (twelve) hours. 09/21/22  Yes Ennever, Rose Phi, MD  OLANZapine (ZYPREXA) 10 MG tablet Take 1 tablet (10 mg total) by mouth at bedtime. 09/21/22  Yes Josph Macho, MD  scopolamine  (TRANSDERM-SCOP) 1 MG/3DAYS Place one patch behind the ear every three days. 09/10/22  Yes Ennever, Rose Phi, MD  fruquintinib (FRUZAQLA) 1 MG capsule Take 3 capsules (3 mg total) by mouth daily. Take for 21 days on, then off for 7 days. Repeat every 28 days. 09/25/22   Josph Macho, MD  ondansetron (ZOFRAN) 4 MG tablet Take 1 tablet (4 mg total) by mouth every 6 (six) hours. Patient not taking: Reported on 09/25/2022 09/04/22   Netta Corrigan, PA-C     Vital Signs: BP 122/68 (BP Location: Left Arm)   Pulse 96   Temp 98.2 F (36.8 C) (Oral)   Resp 20   Ht 5\' 1"  (1.549 m)   Wt 166 lb (75.3 kg)   SpO2 95%   BMI 31.37 kg/m   Physical Exam:  awake/alert; left biliary drain intact, insertion site ok, mild-mod tender, OP 375 cc yesterday, 200+ cc today yellow bile; drain flushed without difficulty  Imaging: IR INT EXT BILIARY DRAIN WITH CHOLANGIOGRAM  Result Date: 10/03/2022 INDICATION: History of metastatic colon cancer, now with biliary obstruction. Patient has been evaluated by the GI service and deemed a poor candidate for endoscopy. As such, the patient presents today for image guided placement of a biliary drainage catheter. EXAM: ULTRASOUND AND FLUOROSCOPIC GUIDED PERCUTANEOUS TRANSHEPATIC CHOLANGIOGRAM AND BILIARY TUBE PLACEMENT COMPARISON:  CT abdomen pelvis-earlier same day; MRCP-10/01/2022. MEDICATIONS: 500 mg levofloxacin; The antibiotic was administered with an appropriate time frame prior to the initiation of the procedure CONTRAST:  20mL OMNIPAQUE IOHEXOL  300 MG/ML SOLN - administered into the biliary tree. ANESTHESIA/SEDATION: Moderate (conscious) sedation was employed during this procedure. A total of Benadryl 25 mg, Versed 4 mg, Dilaudid 1 mg and Fentanyl 200 mcg was administered intravenously. Moderate Sedation Time: 60 minutes. The patient's level of consciousness and vital signs were monitored continuously by radiology nursing throughout the procedure under my direct  supervision. FLUOROSCOPY TIME:  145 mGy COMPLICATIONS: None immediate. TECHNIQUE: Informed written consent was obtained from the patient after a discussion of the risks, benefits and alternatives to treatment. Questions regarding the procedure were encouraged and answered. A timeout was performed prior to the initiation of the procedure. Midline of the abdomen was evaluated by the performing interventional radiologist demonstrated moderate dilatation of the left intrahepatic biliary system as demonstrated on preceding abdominal CT. As such, midline of the abdomen was prepped and draped in the usual sterile fashion, and a sterile drape was applied covering the operative field. Maximum barrier sterile technique with sterile gowns and gloves were used for the procedure. A timeout was performed prior to the initiation of the procedure. Under direct ultrasound guidance, moderately dilated duct within the peripheral aspect of the left lobe liver was accessed with a 22 gauge Chiba needle. Contrast injection confirmed appropriate intra biliary puncture however there was difficulty cannulating a microwire within the bile duct. Several additional attempts were made to access the left intrahepatic biliary system which was ultimately successfully achieved with advancement of a Nitrex wire to the level of the CBD. Under fluoroscopic guidance, the access needle was exchanged for an Accustick set which was advanced to the level of the CBD. Contrast injection confirmed appropriate positioning. Next, with the use of a stiff glidewire, a 4 French angled glide catheter was advanced through the Accustick catheter to the level of the duodenum. Contrast injection confirmed appropriate positioning Amplatz wire was coiled within the horizontal segment of the duodenum and under intermittent fluoroscopic guidance, the track was dilated, ultimately allowing placement of a 10 [place] biliary type drainage catheter with end coiled and locked  within the horizontal segment of the duodenum and radiopaque marker at the level of the central aspect of the left intrahepatic biliary tree. Limited contrast injection was performed and spot radiographic images were obtained in various obliquities. At this point, the drainage catheter was flushed with a small amount of saline and connected gravity bag and secured at the skin entrance site within interrupted suture. A dressing was applied. The patient tolerated the procedure well without immediate postprocedural complication FINDINGS: Sonographic evaluation of the liver demonstrates moderate dilatation of the left intrahepatic biliary tree as was demonstrated on preceding abdominal CT. Under direct ultrasound guidance, a dilated peripheral duct within the left lobe of the liver was opacified and ultimately cannulated, allowing placement of a 10 [place] biliary drainage catheter with end ultimately coiled and locked within the duodenum and radiopaque side marker located proximal to the level of the biliary hilum. Limited contrast injection demonstrates mild dilatation of the left intrahepatic biliary tree with apparent malignant narrowing at the level of the hilum. There is no definitive opacification of the right intrahepatic biliary tree. IMPRESSION: Successful placement of a left-sided approach 10.2 French percutaneous biliary drainage catheter with end coiled and locked within the duodenum. PLAN: - Recommend maintaining the biliary drainage catheter to external drainage. - Recommend obtaining daily CMPs. Once the patient's bilirubin reaches a nadir, the patient may return for repeat cholangiogram as indicated as there was no definitive opacification of the right intrahepatic  biliary tree on today's postprocedural cholangiogram. Electronically Signed   By: Simonne Come M.D.   On: 10/03/2022 17:24   CT ABDOMEN PELVIS W CONTRAST  Result Date: 10/03/2022 CLINICAL DATA:  57 year old with metastatic colon cancer to  the liver with jaundice and elevated bilirubin. Evaluate anatomy prior to potential biliary drain placement. EXAM: CT ABDOMEN AND PELVIS WITH CONTRAST TECHNIQUE: Multidetector CT imaging of the abdomen and pelvis was performed using the standard protocol following bolus administration of intravenous contrast. RADIATION DOSE REDUCTION: This exam was performed according to the departmental dose-optimization program which includes automated exposure control, adjustment of the mA and/or kV according to patient size and/or use of iterative reconstruction technique. CONTRAST:  OMNIPAQUE IOHEXOL 300 MG/ML  SOLN COMPARISON:  09/04/2022 and MRI 10/01/2022 and PET-CT 10/30/2021 FINDINGS: Lower chest: Small amount of volume loss at the right lung base. Trace right pleural fluid/pleural thickening. Small subtle nodules in the visualized lungs are suspicious. Many of the small nodules were not present on the previous PET-CT from 2023. 3 mm nodule in the left lower lobe on image 3/5 has slightly enlarged since 09/04/2022. Incomplete visualization of 7 mm nodule near the right minor fissure on image 1/5. Hepatobiliary: Again noted is a large lobulated mass in the posterior right hepatic lobe and near the dome. This mass or conglomeration of lesions measures greater than 10 cm on image 16/3 and minimally changed from the recent MRI and CT. There are cysts along the inferior right hepatic lobe. No significant hepatic lesions in the left hepatic lobe. Moderate to severe intrahepatic biliary dilatation without extrahepatic biliary dilatation. Intrahepatic biliary dilatation is most prominent in the left hepatic lobe. Abnormal soft tissue in the porta hepatis which could represent lymphadenopathy or tumor. Gallbladder contains stones and has diffuse wall thickening. Large lymph node at the porta hepatis on image 27/3 measures 2.4 x 1.9 cm and minimally changed since 09/04/2022 when it measured 2.1 x 1.7 cm. Concern for  lymphadenopathy or abnormal soft tissue near the base of the gallbladder on image 29/3. Pancreas: Unremarkable. No pancreatic ductal dilatation or surrounding inflammatory changes. Spleen: Stable 1.5 cm low-density structure in the superior aspect of the spleen. Spleen is prominent for size measuring 13 cm in the AP dimension. Adrenals/Urinary Tract: Normal appearance of the adrenal glands. No hydronephrosis. No suspicious renal lesions. Small low-density structures in the kidneys are too small to definitively characterize and do not require dedicated follow-up. Normal appearance of the urinary bladder. Stomach/Bowel: Normal appearance of the stomach. Patient has a loop ileostomy in the right anterior abdomen. Dilated gas-filled loop of bowel associated with the loop ileostomy and there are some dilated loops of bowel just proximal to the ileostomy. Abnormal appearance of the cecum and right colon and best seen on image 59/3. This is most compatible with patient's known primary colon cancer. Again noted are enlarged lymph nodes in the ileocolic mesentery including 1.0 cm lymph node image 56/3. Vascular/Lymphatic: Normal caliber of the abdominal aorta without aneurysm. Again noted is an enlarged lymph node between the aorta and inferior vena cava image 46/3 that measures 1.3 cm in the short axis. Enlarged lymph nodes in the precaval region extending into the porta hepatis. Reproductive: Uterus and bilateral adnexa are unremarkable. Other: Negative for free fluid.  Negative for free air. Musculoskeletal: Again noted is a peripherally sclerotic lesion along the right side of the L1 vertebral body and right pedicle. There is a pathologic compression fracture associated with this lesion and similar to  the exam on 09/04/2022. IMPRESSION: 1. Moderate to severe intrahepatic biliary dilatation without extrahepatic biliary dilatation. Transition point is at the central aspect of the liver near the porta hepatis. Findings are  compatible with a malignant obstruction. Abnormal soft tissue in the porta hepatis at the base of the gallbladder related to neoplasm and/or lymphadenopathy. 2. Minimal change in the large mass involving the superior aspect of the right hepatic lobe. 3. Suspicious tiny nodules at the visualized lung bases. Some of these nodules appear to be new since the PET-CT from 2023 and some of these small nodules may have enlarged within the past month. These small nodules could be infectious or inflammatory but metastatic disease cannot be excluded. Recommend further characterization and surveillance with chest CT. 4. Dilatation of small bowel loops proximal to the ileostomy. Findings are nonspecific but a partial obstruction cannot be excluded. 5. Minimal change in the metastatic lymphadenopathy in the abdomen. 6. Stable appearance of the pathologic compression fracture at L1. 7. Cholelithiasis with diffuse gallbladder wall thickening. Gallbladder wall thickening could be associated with the disease at the porta hepatis. Electronically Signed   By: Richarda Overlie M.D.   On: 10/03/2022 17:17   MR 3D Recon At Scanner  Result Date: 10/02/2022 CLINICAL DATA:  Evaluate bile duct dilatation. Metastatic colon cancer EXAM: MRI ABDOMEN WITHOUT AND WITH CONTRAST (INCLUDING MRCP) TECHNIQUE: Multiplanar multisequence MR imaging of the abdomen was performed both before and after the administration of intravenous contrast. Heavily T2-weighted images of the biliary and pancreatic ducts were obtained, and three-dimensional MRCP images were rendered by post processing. CONTRAST:  7mL GADAVIST GADOBUTROL 1 MMOL/ML IV SOLN COMPARISON:  09/04/2022 FINDINGS: Lower chest: No acute findings. Hepatobiliary: Liver metastases are again noted. Dominant mass within the posterior right hepatic lobe measures 9.4 by 9.7 by 9.5 cm (volume = 450 cm^3), image 14/19. On the previous exam this measured 10.3 x 10.2 by 8.7 cm (volume = 480 cm^3). Lesion in the  lateral segment of left hepatic lobe measures 0.8 cm, image 21/19. Unchanged from previous exam. Cyst within the inferior right lobe of liver unchanged measuring 2.1 cm, image 25/4. Multiple stones are identified within the gallbladder. The largest stone is in the gallbladder neck measuring 1.7 cm. The gallbladder wall appears diffusely edematous measuring 7 mm, image 11/3. There is mild dilatation of the intrahepatic bile ducts to the right lobe of liver, increased from previous exam. New moderate to severe dilatation of the intrahepatic bile ducts to the left lobe of liver. Dilated bile ducts converge within the central liver. Suspect underlying malignant stricture secondary to infiltrative tumor. The common bile duct is nondilated measuring up to 4 mm. No convincing evidence for choledocholithiasis. Pancreas: No mass, inflammatory changes, or other parenchymal abnormality identified. Spleen: Stable 1.5 cm splenic lesion which is favored to represent a benign abnormality. Adrenals/Urinary Tract: Normal adrenal glands. Small cyst within inferior pole of left kidney is unchanged. No follow-up imaging recommended. No hydronephrosis. Stomach/Bowel: Stomach appears normal. Right abdominal loop ileostomy. No dilated bowel loops identified. Vascular/Lymphatic: Normal caliber of the abdominal aorta. Portal vein, portal venous confluence and SMV remain patent. Index upper abdominal adenopathy is again seen. Enlarged portacaval lymph node measures 1.6 cm short axis, image 16/5. Unchanged from previous exam. Aortocaval lymph node measures 1.2 cm, image 30/5. Previously 1.3 cm. Other:  No significant free fluid or fluid collections. Musculoskeletal: Metastatic lesion involving the L1 vertebra with mild superior endplate compression deformity is unchanged. IMPRESSION: 1. There is mild dilatation of  the intrahepatic bile ducts to the right lobe of liver, increased from previous exam. New moderate to severe dilatation of the  intrahepatic bile ducts to the left lobe of liver. Dilated bile ducts converge within the central liver. Suspect underlying malignant stricture secondary to the known large infiltrative tumor. The common bile duct is nondilated measuring up to 4 mm. No convincing evidence for choledocholithiasis. 2. Liver metastases are again noted. The dominant mass within the posterior right hepatic lobe is stable in size from previous exam. 3. Index upper abdominal adenopathy is unchanged. 4. Metastatic lesion involving the L1 vertebra with mild superior endplate compression deformity is unchanged. Electronically Signed   By: Signa Kell M.D.   On: 10/02/2022 05:38   MR ABDOMEN MRCP W WO CONTAST  Result Date: 10/02/2022 CLINICAL DATA:  Evaluate bile duct dilatation. Metastatic colon cancer EXAM: MRI ABDOMEN WITHOUT AND WITH CONTRAST (INCLUDING MRCP) TECHNIQUE: Multiplanar multisequence MR imaging of the abdomen was performed both before and after the administration of intravenous contrast. Heavily T2-weighted images of the biliary and pancreatic ducts were obtained, and three-dimensional MRCP images were rendered by post processing. CONTRAST:  7mL GADAVIST GADOBUTROL 1 MMOL/ML IV SOLN COMPARISON:  09/04/2022 FINDINGS: Lower chest: No acute findings. Hepatobiliary: Liver metastases are again noted. Dominant mass within the posterior right hepatic lobe measures 9.4 by 9.7 by 9.5 cm (volume = 450 cm^3), image 14/19. On the previous exam this measured 10.3 x 10.2 by 8.7 cm (volume = 480 cm^3). Lesion in the lateral segment of left hepatic lobe measures 0.8 cm, image 21/19. Unchanged from previous exam. Cyst within the inferior right lobe of liver unchanged measuring 2.1 cm, image 25/4. Multiple stones are identified within the gallbladder. The largest stone is in the gallbladder neck measuring 1.7 cm. The gallbladder wall appears diffusely edematous measuring 7 mm, image 11/3. There is mild dilatation of the intrahepatic bile  ducts to the right lobe of liver, increased from previous exam. New moderate to severe dilatation of the intrahepatic bile ducts to the left lobe of liver. Dilated bile ducts converge within the central liver. Suspect underlying malignant stricture secondary to infiltrative tumor. The common bile duct is nondilated measuring up to 4 mm. No convincing evidence for choledocholithiasis. Pancreas: No mass, inflammatory changes, or other parenchymal abnormality identified. Spleen: Stable 1.5 cm splenic lesion which is favored to represent a benign abnormality. Adrenals/Urinary Tract: Normal adrenal glands. Small cyst within inferior pole of left kidney is unchanged. No follow-up imaging recommended. No hydronephrosis. Stomach/Bowel: Stomach appears normal. Right abdominal loop ileostomy. No dilated bowel loops identified. Vascular/Lymphatic: Normal caliber of the abdominal aorta. Portal vein, portal venous confluence and SMV remain patent. Index upper abdominal adenopathy is again seen. Enlarged portacaval lymph node measures 1.6 cm short axis, image 16/5. Unchanged from previous exam. Aortocaval lymph node measures 1.2 cm, image 30/5. Previously 1.3 cm. Other:  No significant free fluid or fluid collections. Musculoskeletal: Metastatic lesion involving the L1 vertebra with mild superior endplate compression deformity is unchanged. IMPRESSION: 1. There is mild dilatation of the intrahepatic bile ducts to the right lobe of liver, increased from previous exam. New moderate to severe dilatation of the intrahepatic bile ducts to the left lobe of liver. Dilated bile ducts converge within the central liver. Suspect underlying malignant stricture secondary to the known large infiltrative tumor. The common bile duct is nondilated measuring up to 4 mm. No convincing evidence for choledocholithiasis. 2. Liver metastases are again noted. The dominant mass within the posterior right  hepatic lobe is stable in size from previous exam.  3. Index upper abdominal adenopathy is unchanged. 4. Metastatic lesion involving the L1 vertebra with mild superior endplate compression deformity is unchanged. Electronically Signed   By: Signa Kell M.D.   On: 10/02/2022 05:38    Labs:  CBC: Recent Labs    10/01/22 1136 10/02/22 1402 10/03/22 0348 10/04/22 0329  WBC 3.6* 3.8* 3.6* 4.3  HGB 10.6* 9.7* 10.1* 10.3*  HCT 33.2* 31.4* 32.7* 33.3*  PLT 184 158 163 175    COAGS: Recent Labs    03/15/22 0801 03/29/22 0820 10/02/22 1402  INR 1.0 1.0 1.2    BMP: Recent Labs    10/01/22 1136 10/02/22 1402 10/03/22 0348 10/04/22 0329  NA 138 138 138 136  K 3.0* 2.7* 3.6 4.1  CL 104 108 109 106  CO2 24 23 21* 22  GLUCOSE 149* 161* 96 108*  BUN 7 <5* <5* <5*  CALCIUM 8.6* 8.0* 7.8* 8.0*  CREATININE 0.49 0.38* 0.37* <0.30*  GFRNONAA >60 >60 >60 NOT CALCULATED    LIVER FUNCTION TESTS: Recent Labs    10/01/22 1136 10/02/22 1402 10/03/22 0348 10/04/22 0329  BILITOT 6.9* 6.3* 6.9* 8.0*  AST 110* 102* 101* 105*  ALT 59* 60* 55* 56*  ALKPHOS 759* 654* 641* 706*  PROT 6.8 6.2* 6.2* 6.3*  ALBUMIN 3.4* 2.5* 2.4* 2.4*    Assessment and Plan: 57 year old female with history of metastatic colon cancer to liver and L1 vertebral body. She is s/p port-A-Cath placement in 2022 and right hepatic Y90 radioembolization on 03/29/2022 . Status post laparoscopic loop ileostomy in March of this year; now with biliary obstruction/hyperbilirubinemia, s/p left I/E biliary drain 5/8; afebrile, WBC nl, hgb stable, creat <.30, t bili 8(6.9); check bile cx; cont drain flushes every 8 hrs while inhouse, monitor labs closely, hydrate; as per Dr. Grace Isaac note from yesterday:  Once the patient's bilirubin reaches a nadir, the patient may return for repeat cholangiogram as indicated as there was no definitive opacification of the right intrahepatic biliary tree on today's postprocedural cholangiogram.       Electronically Signed: D. Jeananne Rama, PA-C 10/04/2022, 1:52 PM   I spent a total of 15 Minutes at the the patient's bedside AND on the patient's hospital floor or unit, greater than 50% of which was counseling/coordinating care for biliary drain    Patient ID: Rebecca Bullock, female   DOB: 1966/02/19, 57 y.o.   MRN: [id number]

## 2022-10-04 NOTE — Progress Notes (Signed)
She did have a biliary catheter placed yesterday.  I am not surprised by the incredible expertise of our IR staff.  They are very skilled.  Her bilirubin is still on the high side.  This morning, her bilirubin is 8.  I would like to think that we will she will see an improvement tomorrow.  Hopefully, she will be able to eat a little bit more today.  Pain is still a real problem for her.  I think that Palliative Care will see her today to try to help out with this.  She has had no fever.  There is been no bleeding.  Her white cell count is 4.3.  Hemoglobin 10.3.  Platelet count 175,000.  The alkaline phosphatase is 706.  SGPT 56 SGOT 105.  There has been no bleeding.  She has had no cough or shortness of breath.  Her vital signs show temperature 98.2.  Pulse 96.  Blood pressure 122/68.  Her lungs sound clear bilaterally.  Oral exam shows no mucositis.  She does have some palatal icterus.  Ocular exam does show some scleral icterus.  Cardiac exam regular rate and rhythm.  Abdomen is soft.  She has a ostomy bag that is intact.  She has a biliary catheter that is in the upper left quadrant of the abdomen.  Extremity shows some trace edema in her legs.  Neurological exam shows no focal neurological deficits.  Hopefully, we will see the bilirubin come down.  If not, I really do not see that we will be able to treat her again with chemotherapy.  I will check another prealbumin level on her.  I know she is trying hard.  I know she is doing everything that we have asked her to do.  Our primary goal clearly his quality of life.  Hopefully, Palliative Care will be able to help with pain issues.  I do appreciate the incredible care that she is getting from all the staff upon [address].  Christin Bach, MD  Hughie Closs 2:8-9

## 2022-10-04 NOTE — Progress Notes (Signed)
[facility] Progress Note  Patient: Rebecca Bullock    WGN:[id number]  DOA: 10/02/2022    Date of Service: the patient was seen and examined on 10/04/2022  Brief hospital course: 57 year old female with past medical history of colon cancer with metastases to liver and L1 vertebra, iron deficiency anemia, obesity with recent hospitalization 2 months ago for bowel obstruction status post ileostomy who presented to the emergency room on 5/7 with complaints of emesis and increased jaundice over the past few days.  Patient had been seen by her oncologist the day prior and underwent MRI which noted signs concerning for biliary/hepatic obstruction.  In the emergency room, MRCP done noting underlying malignant stricture caused by known infiltrative tumor.  Case discussed with GI and it was felt that interventional radiology approach would be superior versus ERCP.  Following CT scan, patient underwent successful placement of transhepatic biliary drainage catheter by left hepatic approach.  Assessment and Plan: Hyperbilirubinemia caused by antibiotic bile duct obstruction secondary to metastatic colon cancer: Appreciate GI/oncology/interventional radiology help.  Status post biliary drain placement.  Numbers a little higher today, likely delayed with bump secondary to invasive action taken.  Hopefully will decrease by tomorrow.  Pain persisting, so have changed over to PCA.  Advance diet to full liquids  Hypokalemia: Replace as needed  Obesity: Meets criteria BMI greater than 30      Body mass index is 31.37 kg/m.        Consultants: Gastroenterology Oncology Interventional radiology Palliative care  Procedures: Status post biliary drainage catheter 5/8  Antimicrobials: Preop Levaquin x 1 dose 5/8  Code Status: Full code   Subjective: Still some abdominal pain  Objective: Noted elevated blood pressures Vitals:   10/04/22 0051 10/04/22 0522  BP: 112/88 122/68  Pulse: (!) 102 96   Resp: 16 17  Temp: 97.9 F (36.6 C) 98.2 F (36.8 C)  SpO2: 97% 92%    Intake/Output Summary (Last 24 hours) at 10/04/2022 1048 Last data filed at 10/04/2022 0636 Gross per 24 hour  Intake --  Output 375 ml  Net -375 ml    Filed Weights   10/02/22 1304  Weight: 75.3 kg   Body mass index is 31.37 kg/m.  Exam:  General: Alert and oriented x 3, no acute distress HEENT: Normocephalic atraumatic, mucous membranes slightly dry Cardiovascular: Regular rate and rhythm, S1-S2 Respiratory: Clear to auscultation bilaterally Abdomen: Soft, mildly diffusely tender, mild distention, hypoactive bowel sounds Musculoskeletal: No clubbing or cyanosis or edema Skin: Jaundiced Psychiatry: Appropriate, no evidence of psychoses Neurology: No focal deficits  Data Reviewed: Alkaline phosphatase at 706, albumin 2.4, transaminitis unchanged with bilirubin increased to 8  Disposition:  Status is: Inpatient Remains inpatient appropriate because:    Anticipated discharge date: 5/11  Remaining issues to be resolved so that patient can be discharged:  -Pain control -Improvement in obstructive jaundice   Family Communication: Will call family DVT Prophylaxis: enoxaparin (LOVENOX) injection 40 mg Start: 10/04/22 0800 SCDs Start: 10/02/22 1352    Author: Hollice Espy ,MD 10/04/2022 10:48 AM  To reach On-call, see care teams to locate the attending and reach out via [email or web address]. Between 7PM-7AM, please contact night-coverage If you still have difficulty reaching the attending provider, please page the [facility] (Director on Call) for [facility] on amion for assistance.

## 2022-10-04 NOTE — Consult Note (Signed)
Palliative Care Consult Note                                  Date: 10/04/2022   Patient Name: Rebecca Bullock  DOB: February 20, 1966  MRN: [id number]  Age / Sex: 57 y.o., female  PCP: Jomarie Longs, PA-C Referring Physician: Hollice Espy, MD  Reason for Consultation: Establishing goals of care, Non pain symptom management, and Pain control  HPI/Patient Profile: Palliative Care consult requested for goals of care discussion in this 57 y.o. female  with medical history of metastatic colon cancer with liver involvement, s/p ileostomy secondary to bowel obstruction (07/2022), vitamin D deficiency, and iron deficiency anemia. She was admitted on 10/02/2022 from home with with juandice, worsening abdominal pain, and emesis. Patient being followed by IR s/p left hepatic biliary drain placement and GI.    Past Medical History:  Diagnosis Date   Colon cancer metastasized to liver [facility]) 05/05/2021   Family history of lung cancer    Family history of pancreatic cancer    Goals of care, counseling/discussion 05/05/2021   History of radiation therapy    Lumbar Spine- 07/12/22-07/25/22- Dr. Antony Blackbird     Subjective:   This NP Royal Hawthorn reviewed medical records, received report from team, assessed the patient and then met at the patient's bedside with Rebecca Bullock, Rebecca Bullock, and patient's 2 daughters to discuss diagnosis, prognosis, GOC, EOL wishes disposition and options.  Rebecca Bullock is sitting upright in bed. Appears comfortable. States pain is controlled since starting PCA this morning. Alert and able to engage appropriately in discussions.    Ms. Bruyere and family are familiar to myself and palliative team. I have actively been involved in her care at [facility] in support of Dr. Myna Hidalgo at [facility]. Values and goals of care important to patient and family were attempted to be elicited.  I created space and opportunity for  patient and family to explore state of health prior to admission, thoughts, and feelings. Rebecca Bullock and her family shares their disappointment as she seemed to be in a much better place a week prior. Pain much improved with recent regimen changes. Nausea decreased and she has been more active than she has in months. Weekend trip with family.   We discussed her abdominal pain at length. She describes pain as sharp, aching, throbbing, which comes and goes. Movement and positioning causes increase in pain. I reviewed medical chart and previous home regimen at length. Rebecca Bullock shares patient has been tolerating MS Contin 30mg  every 12 hours prior to admission. She was not requiring hydromorphone every 4 hours around the clock and able to go 5-6 hours at a time on some days. Since recent hospitalization and admission feels pain has increased however she is relating this to obstruction and recent procedure.   Education provided on potential changes to pain regimen. She is tolerating oral medications without difficulty. I did discuss potential changes to MS Contin and initiation of fentanyl patch. Caniyah reports pain is controlled with current regimen and is hopeful that previous regimen will remain sufficient. She and family is requesting to allow and additional 24 hours of close evaluation utilizing PCA. Would then be open to changes and discontinue PCA based on recommendations. They are aware I will plan to evaluate PCA usage over the next 24 hours and we will then proceed with adjustments. She and family verbalized understanding.   We  discussed Her current illness and what it means in the larger context of Her on-going co-morbidities. Natural disease trajectory and expectations were discussed.  Patient and family verbalized understanding of current illness and co-morbidities. They are realistic in their understanding. She understands her cancer is incurable. Clear in expressed wishes for watchful waiting and making  necessary decisions as needed.   I discussed the importance of continued conversation with family and their medical providers regarding overall plan of care and treatment options, ensuring decisions are within the context of the patients values and GOCs.  Questions and concerns were addressed. Patient and family was encouraged to call with questions or concerns.  PMT will continue to support holistically as needed.  Objective:   Primary Diagnoses: Present on Admission:  Colon cancer metastasized to liver (HCC)  Bile duct obstruction, intrahepatic  Pancytopenia (HCC)  Hypokalemia  Hyperbilirubinemia   Scheduled Meds:  baclofen  10 mg Oral TID   Chlorhexidine Gluconate Cloth  6 each Topical Daily   doxycycline  100 mg Oral BID   enoxaparin (LOVENOX) injection  40 mg Subcutaneous Q24H   famotidine  20 mg Oral BID   HYDROmorphone   Intravenous Q4H   megestrol  400 mg Oral BID   morphine  30 mg Oral Q12H   OLANZapine  10 mg Oral QHS   [START ON 10/05/2022] scopolamine  1 patch Transdermal Q72H   sodium chloride flush  5 mL Intracatheter Q8H    Continuous Infusions:   PRN Meds: diphenhydrAMINE **OR** diphenhydrAMINE, LORazepam, naloxone **AND** sodium chloride flush, ondansetron (ZOFRAN) IV, ondansetron **OR** [DISCONTINUED] ondansetron (ZOFRAN) IV, sodium chloride flush  Allergies  Allergen Reactions   Augmentin [Amoxicillin-Pot Clavulanate] Diarrhea and Other (See Comments)    Extreme diarrhea, abdominal pain.   Irinotecan Other (See Comments)    Flushing, tingling, visual disturbance   Metronidazole Diarrhea and Other (See Comments)   Vancomycin Rash and Other (See Comments)    "Red Man Syndrome"   Latex Rash   Tape Rash   Wound Dressing Adhesive Rash    Review of Systems  Constitutional:  Positive for activity change, appetite change and fatigue.  Gastrointestinal:  Positive for abdominal distention and abdominal pain.  Musculoskeletal:  Positive for arthralgias.   Skin:        Jaundiced   Neurological:  Positive for weakness.   Unless otherwise noted, a complete review of systems is negative.  Physical Exam General: NAD, frail  Cardiovascular: regular rate and rhythm Pulmonary: diminished bilaterally  Abdomen: soft, tender, mild distention, hypo bowel sounds, ileostomy in place, stoma pink (no output) Extremities: no edema, no joint deformities Skin: no rashes, warm and dry, Jaundiced  Neurological: AAO x4  Vital Signs:  BP 122/68 (BP Location: Left Arm)   Pulse 96   Temp 98.2 F (36.8 C) (Oral)   Resp 20   Ht 5\' 1"  (1.549 m)   Wt 75.3 kg   SpO2 95%   BMI 31.37 kg/m  Pain Scale: 0-10 POSS *See Group Information*: 1-Acceptable,Awake and alert Pain Score: 10-Worst pain ever  SpO2: SpO2: 95 % O2 Device:SpO2: 95 % O2 Flow Rate: .O2 Flow Rate (L/min): 0 L/min  IO: Intake/output summary:  Intake/Output Summary (Last 24 hours) at 10/04/2022 1351 Last data filed at 10/04/2022 0835 Gross per 24 hour  Intake --  Output 575 ml  Net -575 ml    LBM: Last BM Date : 10/03/22 Baseline Weight: Weight: 75.3 kg Most recent weight: Weight: 75.3 kg  Palliative Assessment/Data:    Advanced Care Planning:   Primary Decision Maker: NEXT OF KIN  Code Status/Advance Care Planning: Full code  A discussion was had today regarding advanced directives. Concepts specific to code status, artifical feeding and hydration, continued IV antibiotics and rehospitalization was had.  The difference between a aggressive medical intervention path and a palliative comfort care path was discussed.  We will plan to further discuss MOST form in upcoming days. Rebecca Bullock does not have a documented advanced directive. She would like to at minimum discuss with family and complete while hospitalized. I have previously provided her with packet which she has at the bedside. Will place Spiritual care referral to assist in finalizing.   I empathetically approached hospice  discussions as initiated by patient and family. Emotional support provided. We discussed hospice and when most appropriate. Patient and family would like to continue with all treatments at this time however if no improvement, worsening condition, or she reaches a point where no further oncological therapies are available she would then wish to focus on aggressive symptom management and what time she has with her family. Patient would like to take things one day at a time with ongoing discussions and support.   Hospice outpatient were explained.Patient and family verbalized their understanding and awareness of both palliative and hospice's goals and philosophy of care.   Assessment & Plan:   SUMMARY OF RECOMMENDATIONS   Full Code-as confirmed by patient and family. Ongoing discussions. Further discussions regarding Code status and MOST form ongoing.  Rebecca Bullock would like to complete advanced directives. AD packet at bedside and have been reviewed. Would like to complete tomorrow or upcoming days.  Spiritual Care referral  Continue with current plan of care. Patient and family are realistic in their understanding. They are remaining hopeful for some stability but also preparing for no improvement or further decline. Appropriate questions asked and initiation of hospice discussions.  WOC RN to evaluate and assist with appropriate supplies.  PMT will continue to support and follow. Please secure chat line with urgent needs.  Symptom Management:  Neoplasm related pain Dilaudid PCA 1.25mg /hr as initiated by Hospitalist. Will continue over next 24 hours closely evaluating usage and oral dosing adjustment needs.  MS Contin 30 mg every 12 hours  Baclofen 10 mg three times daily for spasms/cramping  Nausea Pepcid 20 mg twice daily  Ativan 0.5mg  every 4 hours as needed for anxiety/nausea Olanzapine 10mg  at bedtime  Zofran 4mg  every 6 hours as needed Scopolamine Patch every 72 hours Anxiety Ativan 0.5mg   every 4 hours as needed for anxiety  Palliative Prophylaxis:  Bowel Regimen, Frequent Pain Assessment, Palliative Wound Care, and Turn Reposition  Additional Recommendations (Limitations, Scope, Preferences): Full Scope/Full Code   Psycho-social/Spiritual:  Desire for further Chaplaincy support: yes Additional Recommendations: Education on Hospice and ongoing goals of care discussions  Prognosis:  Guarded-Poor   Discharge Planning:  To Be Determined   Patient and family expressed understanding and was in agreement with this plan.   Time Total: 60 min   Visit consisted of counseling and education dealing with the complex and emotionally intense issues of symptom management and palliative care in the setting of serious and potentially life-threatening illness.Greater than 50%  of this time was spent counseling and coordinating care related to the above assessment and plan.  Signed by:  Willette Alma, AGPCNP-BC Pall[facility]   Phone: [phone] Pager: [phone] Amion: Rebecca Bullock   Thank you for allowing the Palliative  Medicine Team to assist in the care of this patient. Please utilize secure chat with additional questions, if there is no response within 30 minutes please call the above phone number. Palliative Medicine Team providers are available by phone from 7am to 5pm daily and can be reached through the team cell phone.  Should this patient require assistance outside of these hours, please call the patient's attending physician.  *Please note that this is a verbal dictation therefore any spelling or grammatical errors are due to the "[employer]agon [employer] One" system interpretation.

## 2022-10-05 DIAGNOSIS — Z515 Encounter for palliative care: Secondary | ICD-10-CM | POA: Diagnosis not present

## 2022-10-05 DIAGNOSIS — E669 Obesity, unspecified: Secondary | ICD-10-CM | POA: Diagnosis not present

## 2022-10-05 DIAGNOSIS — K831 Obstruction of bile duct: Secondary | ICD-10-CM | POA: Diagnosis not present

## 2022-10-05 DIAGNOSIS — C189 Malignant neoplasm of colon, unspecified: Secondary | ICD-10-CM | POA: Diagnosis not present

## 2022-10-05 DIAGNOSIS — G893 Neoplasm related pain (acute) (chronic): Secondary | ICD-10-CM | POA: Diagnosis not present

## 2022-10-05 LAB — COMPREHENSIVE METABOLIC PANEL
ALT: 45 U/L — ABNORMAL HIGH (ref 0–44)
AST: 71 U/L — ABNORMAL HIGH (ref 15–41)
Albumin: 2.4 g/dL — ABNORMAL LOW (ref 3.5–5.0)
Alkaline Phosphatase: 595 U/L — ABNORMAL HIGH (ref 38–126)
Anion gap: 7 (ref 5–15)
BUN: 5 mg/dL — ABNORMAL LOW (ref 6–20)
CO2: 23 mmol/L (ref 22–32)
Calcium: 8.1 mg/dL — ABNORMAL LOW (ref 8.9–10.3)
Chloride: 106 mmol/L (ref 98–111)
Creatinine, Ser: 0.36 mg/dL — ABNORMAL LOW (ref 0.44–1.00)
GFR, Estimated: >60 mL/min (ref >60)
Glucose, Bld: 101 mg/dL — ABNORMAL HIGH (ref 70–99)
Potassium: 3.8 mmol/L (ref 3.5–5.1)
Sodium: 136 mmol/L (ref 135–145)
Total Bilirubin: 5.6 mg/dL — ABNORMAL HIGH (ref 0.3–1.2)
Total Protein: 6.4 g/dL — ABNORMAL LOW (ref 6.5–8.1)

## 2022-10-05 LAB — CBC WITH DIFFERENTIAL/PLATELET
Abs Immature Granulocytes: 0.01 10*3/uL (ref 0.00–0.07)
Basophils Absolute: 0 10*3/uL (ref 0.0–0.1)
Basophils Relative: 0 %
Eosinophils Absolute: 0.1 10*3/uL (ref 0.0–0.5)
Eosinophils Relative: 2 %
HCT: 32.8 % — ABNORMAL LOW (ref 36.0–46.0)
Hemoglobin: 10.3 g/dL — ABNORMAL LOW (ref 12.0–15.0)
Immature Granulocytes: 0 %
Lymphocytes Relative: 11 %
Lymphs Abs: 0.6 10*3/uL — ABNORMAL LOW (ref 0.7–4.0)
MCH: 29.7 pg (ref 26.0–34.0)
MCHC: 31.4 g/dL (ref 30.0–36.0)
MCV: 94.5 fL (ref 80.0–100.0)
Monocytes Absolute: 0.5 10*3/uL (ref 0.1–1.0)
Monocytes Relative: 8 %
Neutro Abs: 4.5 10*3/uL (ref 1.7–7.7)
Neutrophils Relative %: 79 %
Platelets: 172 10*3/uL (ref 150–400)
RBC: 3.47 MIL/uL — ABNORMAL LOW (ref 3.87–5.11)
RDW: 19.1 % — ABNORMAL HIGH (ref 11.5–15.5)
WBC: 5.7 10*3/uL (ref 4.0–10.5)
nRBC: 0 % (ref 0.0–0.2)

## 2022-10-05 LAB — PREALBUMIN: Prealbumin: 7 mg/dL — ABNORMAL LOW (ref 18–38)

## 2022-10-05 LAB — BODY FLUID CULTURE W GRAM STAIN: Gram Stain: NONE SEEN

## 2022-10-05 MED ORDER — MORPHINE SULFATE ER 30 MG PO TBCR
30.0000 mg | EXTENDED_RELEASE_TABLET | Freq: Three times a day (TID) | ORAL | Status: DC
  Administered 2022-10-05: 30 mg via ORAL
  Filled 2022-10-05: qty 1

## 2022-10-05 MED ORDER — HYDROMORPHONE HCL 2 MG PO TABS
4.0000 mg | ORAL_TABLET | ORAL | Status: DC | PRN
  Administered 2022-10-05 – 2022-10-06 (×3): 4 mg via ORAL
  Filled 2022-10-05 (×3): qty 2

## 2022-10-05 MED ORDER — LIDOCAINE 5 % EX PTCH
1.0000 | MEDICATED_PATCH | CUTANEOUS | Status: DC
  Administered 2022-10-05 – 2022-10-08 (×4): 1 via TRANSDERMAL
  Filled 2022-10-05 (×4): qty 1

## 2022-10-05 MED ORDER — HYDROMORPHONE HCL 1 MG/ML IJ SOLN
1.0000 mg | INTRAMUSCULAR | Status: DC | PRN
  Administered 2022-10-05 – 2022-10-06 (×6): 1 mg via INTRAVENOUS
  Filled 2022-10-05 (×6): qty 1

## 2022-10-05 MED ORDER — HYDROMORPHONE HCL 2 MG PO TABS
4.0000 mg | ORAL_TABLET | ORAL | Status: DC | PRN
  Administered 2022-10-05: 4 mg via ORAL
  Filled 2022-10-05: qty 2

## 2022-10-05 MED ORDER — HYDROMORPHONE HCL 1 MG/ML IJ SOLN
1.0000 mg | INTRAMUSCULAR | Status: DC | PRN
  Administered 2022-10-05: 1 mg via INTRAVENOUS
  Filled 2022-10-05: qty 1

## 2022-10-05 MED ORDER — MORPHINE SULFATE ER 30 MG PO TBCR
30.0000 mg | EXTENDED_RELEASE_TABLET | Freq: Three times a day (TID) | ORAL | Status: DC
  Administered 2022-10-05 – 2022-10-08 (×10): 30 mg via ORAL
  Filled 2022-10-05 (×10): qty 1

## 2022-10-05 NOTE — Progress Notes (Signed)
   10/05/22 1100  Spiritual Encounters  Type of Visit Initial  Care provided to: Pt and family  Referral source Chaplain team;Chaplain assessment  Reason for visit Urgent spiritual support  OnCall Visit No  Spiritual Framework  Presenting Themes Meaning/purpose/sources of inspiration;Values and beliefs;Significant life change  Community/Connection Family;Friend(s);Faith community;Spiritual leader  Patient Stress Factors None identified  Family Stress Factors Loss of control;Major life changes  Interventions  Spiritual Care Interventions Made Reflective listening;Compassionate presence;Established relationship of care and support;Normalization of emotions;Explored values/beliefs/practices/strengths;Meaning making  Intervention Outcomes  Outcomes Connection to values and goals of care;Autonomy/agency;Awareness of health;Awareness of support  Spiritual Care Plan  Spiritual Care Issues Still Outstanding Chaplain will continue to follow   Chap;lain met  with patnient and her family at bedside this morning.  Provided spiritual care and support.  Spoke with two of her three adult chid;ren, wove moved into Yizel's home for her support.   They spoke lovingly about getting a mothers Day resent for her this weekend.   Olia appeared reluctant to talk of personal matters or health matters - possibly because the presence of her two children.   Ended visit with a departing belssing.

## 2022-10-05 NOTE — Consult Note (Signed)
WOC Nurse ostomy consult note Stoma type/location: RLQ ileostomy. Seen my my associates during last admission and in the outpatient ostomy clinic on 09/11/22.Patient is independent in the care of her ileostomy. WOC Nursing will provide guidance for staff nursing regarding supplies. Stomal assessment/size: at last appointment, stoma measured 1 and 3/4 inches Peristomal assessment: Not seen today Treatment options for stomal/peristomal skin: skin barrier ring Output : Dr. Myna Hidalgo has seen this morning and notes that pouching system is intact, stool present and good bowel sounds. Ostomy pouching: 1pc.convex pouching system with skin barrier ring and belt. Education provided: None today Enrolled patient in [employer] DC program: Yes, previously.   Order Supplies: 1-piece convex ostomy pouching system with skin barrier ring and belt.  Pouch is [employer] # [id number], ring is [employer] # [id number] and belt is [employer] # [id number]  I have asked nursing to order 5 pouches, 5 rings and 2 belts to bedside.  WOC nursing team will not follow, but will remain available to this patient, the nursing and medical teams.  Please re-consult if needed.  Thank you for inviting [place] to participate in this patient's Plan of Care.  Ladona Mow, MSN, RN, CNS, GNP, Leda Min, [employer], [employer] phone:  [phone]

## 2022-10-05 NOTE — TOC Transition Note (Signed)
Transition of Care [facility]) - CM/SW Discharge Note   Patient Details  Name: Rebecca Bullock MRN: [id number] Date of Birth: 05-31-1965  [facility] CM/SW Contact:  Lanier Clam, RN Phone Number: 10/05/2022, 12:29 PM   Clinical Narrative:   No CM needs or orders.    Final next level of care: Home/Self Care Barriers to Discharge: No Barriers Identified   Patient Goals and CMS Choice      Discharge Placement                         Discharge Plan and Services Additional resources added to the After Visit Summary for                                       Social Determinants of Health (SDOH) Interventions SDOH Screenings   Food Insecurity: No Food Insecurity (10/02/2022)  Housing: Low Risk  (10/02/2022)  Transportation Needs: No Transportation Needs (10/02/2022)  Utilities: Not At Risk (10/02/2022)  Depression (PHQ2-9): Low Risk  (10/16/2021)  Financial Resource Strain: Medium Risk (05/18/2021)  Tobacco Use: Low Risk  (10/03/2022)     Readmission Risk Interventions     No data to display

## 2022-10-05 NOTE — Progress Notes (Signed)
Referring Physician(s): Ennever,P  Supervising Physician: Ruel Favors  Patient Status:  [facility] - In-pt  Chief Complaint: Abdominal /back pain, metastatic colon cancer/biliary obstruction    Subjective: Pt cont to have some abd/back discomfort; denies fever,N/V   Allergies: Augmentin [amoxicillin-pot clavulanate], Irinotecan, Metronidazole, Vancomycin, Latex, Tape, and Wound dressing adhesive  Medications: Prior to Admission medications   Medication Sig Start Date End Date Taking? Authorizing Provider  baclofen (LIORESAL) 10 MG tablet Take 1 tablet (10 mg total) by mouth 3 (three) times daily. 08/28/22  Yes Ennever, Rose Phi, MD  doxycycline (VIBRA-TABS) 100 MG tablet Take 1 tablet (100 mg total) by mouth 2 (two) times daily. 09/25/22  Yes Josph Macho, MD  famotidine (PEPCID) 40 MG tablet Take 1 tablet (40 mg total) by mouth 2 (two) times daily. 08/07/22  Yes Ennever, Rose Phi, MD  HYDROmorphone (DILAUDID) 4 MG tablet Take 1 tablet (4 mg total) by mouth every 2 (two) hours as needed for severe pain. Patient taking differently: Take 4 mg by mouth every 4 (four) hours as needed for severe pain. Per Palliative Care: Take every 4-6 hour prn. 09/12/22  Yes Erenest Blank, NP  LORazepam (ATIVAN) 0.5 MG tablet Place 1 tablet (0.5 mg total) under the tongue every 4 (four) hours as needed for anxiety. 09/21/22  Yes Josph Macho, MD  megestrol (MEGACE) 400 MG/10ML suspension Take 10 mLs (400 mg total) by mouth 2 (two) times daily. 09/21/22  Yes Josph Macho, MD  meloxicam (MOBIC) 15 MG tablet Take 1 tablet (15 mg total) by mouth daily. 09/24/22  Yes Pickenpack-Cousar, Arty Baumgartner, NP  morphine (MS CONTIN) 30 MG 12 hr tablet Take 1 tablet (30 mg total) by mouth every 12 (twelve) hours. 09/21/22  Yes Ennever, Rose Phi, MD  OLANZapine (ZYPREXA) 10 MG tablet Take 1 tablet (10 mg total) by mouth at bedtime. 09/21/22  Yes Josph Macho, MD  scopolamine (TRANSDERM-SCOP) 1 MG/3DAYS Place one patch  behind the ear every three days. 09/10/22  Yes Ennever, Rose Phi, MD  fruquintinib (FRUZAQLA) 1 MG capsule Take 3 capsules (3 mg total) by mouth daily. Take for 21 days on, then off for 7 days. Repeat every 28 days. 09/25/22   Josph Macho, MD  ondansetron (ZOFRAN) 4 MG tablet Take 1 tablet (4 mg total) by mouth every 6 (six) hours. Patient not taking: Reported on 09/25/2022 09/04/22   Netta Corrigan, PA-C     Vital Signs: BP 125/82 (BP Location: Left Arm)   Pulse 92   Temp 98 F (36.7 C)   Resp 18   Ht 5\' 1"  (1.549 m)   Wt 166 lb (75.3 kg)   SpO2 96%   BMI 31.37 kg/m   Physical Exam awake/alert; left biliary drain intact, insertion site ok, mildly tender, OP 200 + cc yellow bile; drain flushed without difficulty  Imaging: IR INT EXT BILIARY DRAIN WITH CHOLANGIOGRAM  Result Date: 10/03/2022 INDICATION: History of metastatic colon cancer, now with biliary obstruction. Patient has been evaluated by the GI service and deemed a poor candidate for endoscopy. As such, the patient presents today for image guided placement of a biliary drainage catheter. EXAM: ULTRASOUND AND FLUOROSCOPIC GUIDED PERCUTANEOUS TRANSHEPATIC CHOLANGIOGRAM AND BILIARY TUBE PLACEMENT COMPARISON:  CT abdomen pelvis-earlier same day; MRCP-10/01/2022. MEDICATIONS: 500 mg levofloxacin; The antibiotic was administered with an appropriate time frame prior to the initiation of the procedure CONTRAST:  20mL OMNIPAQUE IOHEXOL 300 MG/ML SOLN - administered into the biliary tree.  ANESTHESIA/SEDATION: Moderate (conscious) sedation was employed during this procedure. A total of Benadryl 25 mg, Versed 4 mg, Dilaudid 1 mg and Fentanyl 200 mcg was administered intravenously. Moderate Sedation Time: 60 minutes. The patient's level of consciousness and vital signs were monitored continuously by radiology nursing throughout the procedure under my direct supervision. FLUOROSCOPY TIME:  145 mGy COMPLICATIONS: None immediate. TECHNIQUE: Informed  written consent was obtained from the patient after a discussion of the risks, benefits and alternatives to treatment. Questions regarding the procedure were encouraged and answered. A timeout was performed prior to the initiation of the procedure. Midline of the abdomen was evaluated by the performing interventional radiologist demonstrated moderate dilatation of the left intrahepatic biliary system as demonstrated on preceding abdominal CT. As such, midline of the abdomen was prepped and draped in the usual sterile fashion, and a sterile drape was applied covering the operative field. Maximum barrier sterile technique with sterile gowns and gloves were used for the procedure. A timeout was performed prior to the initiation of the procedure. Under direct ultrasound guidance, moderately dilated duct within the peripheral aspect of the left lobe liver was accessed with a 22 gauge Chiba needle. Contrast injection confirmed appropriate intra biliary puncture however there was difficulty cannulating a microwire within the bile duct. Several additional attempts were made to access the left intrahepatic biliary system which was ultimately successfully achieved with advancement of a Nitrex wire to the level of the CBD. Under fluoroscopic guidance, the access needle was exchanged for an Accustick set which was advanced to the level of the CBD. Contrast injection confirmed appropriate positioning. Next, with the use of a stiff glidewire, a 4 French angled glide catheter was advanced through the Accustick catheter to the level of the duodenum. Contrast injection confirmed appropriate positioning Amplatz wire was coiled within the horizontal segment of the duodenum and under intermittent fluoroscopic guidance, the track was dilated, ultimately allowing placement of a 10 [place] biliary type drainage catheter with end coiled and locked within the horizontal segment of the duodenum and radiopaque marker at the level of the  central aspect of the left intrahepatic biliary tree. Limited contrast injection was performed and spot radiographic images were obtained in various obliquities. At this point, the drainage catheter was flushed with a small amount of saline and connected gravity bag and secured at the skin entrance site within interrupted suture. A dressing was applied. The patient tolerated the procedure well without immediate postprocedural complication FINDINGS: Sonographic evaluation of the liver demonstrates moderate dilatation of the left intrahepatic biliary tree as was demonstrated on preceding abdominal CT. Under direct ultrasound guidance, a dilated peripheral duct within the left lobe of the liver was opacified and ultimately cannulated, allowing placement of a 10 [place] biliary drainage catheter with end ultimately coiled and locked within the duodenum and radiopaque side marker located proximal to the level of the biliary hilum. Limited contrast injection demonstrates mild dilatation of the left intrahepatic biliary tree with apparent malignant narrowing at the level of the hilum. There is no definitive opacification of the right intrahepatic biliary tree. IMPRESSION: Successful placement of a left-sided approach 10.2 French percutaneous biliary drainage catheter with end coiled and locked within the duodenum. PLAN: - Recommend maintaining the biliary drainage catheter to external drainage. - Recommend obtaining daily CMPs. Once the patient's bilirubin reaches a nadir, the patient may return for repeat cholangiogram as indicated as there was no definitive opacification of the right intrahepatic biliary tree on today's postprocedural cholangiogram. Electronically Signed  By: Simonne Come M.D.   On: 10/03/2022 17:24   CT ABDOMEN PELVIS W CONTRAST  Result Date: 10/03/2022 CLINICAL DATA:  57 year old with metastatic colon cancer to the liver with jaundice and elevated bilirubin. Evaluate anatomy prior to potential  biliary drain placement. EXAM: CT ABDOMEN AND PELVIS WITH CONTRAST TECHNIQUE: Multidetector CT imaging of the abdomen and pelvis was performed using the standard protocol following bolus administration of intravenous contrast. RADIATION DOSE REDUCTION: This exam was performed according to the departmental dose-optimization program which includes automated exposure control, adjustment of the mA and/or kV according to patient size and/or use of iterative reconstruction technique. CONTRAST:  OMNIPAQUE IOHEXOL 300 MG/ML  SOLN COMPARISON:  09/04/2022 and MRI 10/01/2022 and PET-CT 10/30/2021 FINDINGS: Lower chest: Small amount of volume loss at the right lung base. Trace right pleural fluid/pleural thickening. Small subtle nodules in the visualized lungs are suspicious. Many of the small nodules were not present on the previous PET-CT from 2023. 3 mm nodule in the left lower lobe on image 3/5 has slightly enlarged since 09/04/2022. Incomplete visualization of 7 mm nodule near the right minor fissure on image 1/5. Hepatobiliary: Again noted is a large lobulated mass in the posterior right hepatic lobe and near the dome. This mass or conglomeration of lesions measures greater than 10 cm on image 16/3 and minimally changed from the recent MRI and CT. There are cysts along the inferior right hepatic lobe. No significant hepatic lesions in the left hepatic lobe. Moderate to severe intrahepatic biliary dilatation without extrahepatic biliary dilatation. Intrahepatic biliary dilatation is most prominent in the left hepatic lobe. Abnormal soft tissue in the porta hepatis which could represent lymphadenopathy or tumor. Gallbladder contains stones and has diffuse wall thickening. Large lymph node at the porta hepatis on image 27/3 measures 2.4 x 1.9 cm and minimally changed since 09/04/2022 when it measured 2.1 x 1.7 cm. Concern for lymphadenopathy or abnormal soft tissue near the base of the gallbladder on image 29/3.  Pancreas: Unremarkable. No pancreatic ductal dilatation or surrounding inflammatory changes. Spleen: Stable 1.5 cm low-density structure in the superior aspect of the spleen. Spleen is prominent for size measuring 13 cm in the AP dimension. Adrenals/Urinary Tract: Normal appearance of the adrenal glands. No hydronephrosis. No suspicious renal lesions. Small low-density structures in the kidneys are too small to definitively characterize and do not require dedicated follow-up. Normal appearance of the urinary bladder. Stomach/Bowel: Normal appearance of the stomach. Patient has a loop ileostomy in the right anterior abdomen. Dilated gas-filled loop of bowel associated with the loop ileostomy and there are some dilated loops of bowel just proximal to the ileostomy. Abnormal appearance of the cecum and right colon and best seen on image 59/3. This is most compatible with patient's known primary colon cancer. Again noted are enlarged lymph nodes in the ileocolic mesentery including 1.0 cm lymph node image 56/3. Vascular/Lymphatic: Normal caliber of the abdominal aorta without aneurysm. Again noted is an enlarged lymph node between the aorta and inferior vena cava image 46/3 that measures 1.3 cm in the short axis. Enlarged lymph nodes in the precaval region extending into the porta hepatis. Reproductive: Uterus and bilateral adnexa are unremarkable. Other: Negative for free fluid.  Negative for free air. Musculoskeletal: Again noted is a peripherally sclerotic lesion along the right side of the L1 vertebral body and right pedicle. There is a pathologic compression fracture associated with this lesion and similar to the exam on 09/04/2022. IMPRESSION: 1. Moderate to severe intrahepatic  biliary dilatation without extrahepatic biliary dilatation. Transition point is at the central aspect of the liver near the porta hepatis. Findings are compatible with a malignant obstruction. Abnormal soft tissue in the porta hepatis at  the base of the gallbladder related to neoplasm and/or lymphadenopathy. 2. Minimal change in the large mass involving the superior aspect of the right hepatic lobe. 3. Suspicious tiny nodules at the visualized lung bases. Some of these nodules appear to be new since the PET-CT from 2023 and some of these small nodules may have enlarged within the past month. These small nodules could be infectious or inflammatory but metastatic disease cannot be excluded. Recommend further characterization and surveillance with chest CT. 4. Dilatation of small bowel loops proximal to the ileostomy. Findings are nonspecific but a partial obstruction cannot be excluded. 5. Minimal change in the metastatic lymphadenopathy in the abdomen. 6. Stable appearance of the pathologic compression fracture at L1. 7. Cholelithiasis with diffuse gallbladder wall thickening. Gallbladder wall thickening could be associated with the disease at the porta hepatis. Electronically Signed   By: Richarda Overlie M.D.   On: 10/03/2022 17:17   MR 3D Recon At Scanner  Result Date: 10/02/2022 CLINICAL DATA:  Evaluate bile duct dilatation. Metastatic colon cancer EXAM: MRI ABDOMEN WITHOUT AND WITH CONTRAST (INCLUDING MRCP) TECHNIQUE: Multiplanar multisequence MR imaging of the abdomen was performed both before and after the administration of intravenous contrast. Heavily T2-weighted images of the biliary and pancreatic ducts were obtained, and three-dimensional MRCP images were rendered by post processing. CONTRAST:  7mL GADAVIST GADOBUTROL 1 MMOL/ML IV SOLN COMPARISON:  09/04/2022 FINDINGS: Lower chest: No acute findings. Hepatobiliary: Liver metastases are again noted. Dominant mass within the posterior right hepatic lobe measures 9.4 by 9.7 by 9.5 cm (volume = 450 cm^3), image 14/19. On the previous exam this measured 10.3 x 10.2 by 8.7 cm (volume = 480 cm^3). Lesion in the lateral segment of left hepatic lobe measures 0.8 cm, image 21/19. Unchanged from  previous exam. Cyst within the inferior right lobe of liver unchanged measuring 2.1 cm, image 25/4. Multiple stones are identified within the gallbladder. The largest stone is in the gallbladder neck measuring 1.7 cm. The gallbladder wall appears diffusely edematous measuring 7 mm, image 11/3. There is mild dilatation of the intrahepatic bile ducts to the right lobe of liver, increased from previous exam. New moderate to severe dilatation of the intrahepatic bile ducts to the left lobe of liver. Dilated bile ducts converge within the central liver. Suspect underlying malignant stricture secondary to infiltrative tumor. The common bile duct is nondilated measuring up to 4 mm. No convincing evidence for choledocholithiasis. Pancreas: No mass, inflammatory changes, or other parenchymal abnormality identified. Spleen: Stable 1.5 cm splenic lesion which is favored to represent a benign abnormality. Adrenals/Urinary Tract: Normal adrenal glands. Small cyst within inferior pole of left kidney is unchanged. No follow-up imaging recommended. No hydronephrosis. Stomach/Bowel: Stomach appears normal. Right abdominal loop ileostomy. No dilated bowel loops identified. Vascular/Lymphatic: Normal caliber of the abdominal aorta. Portal vein, portal venous confluence and SMV remain patent. Index upper abdominal adenopathy is again seen. Enlarged portacaval lymph node measures 1.6 cm short axis, image 16/5. Unchanged from previous exam. Aortocaval lymph node measures 1.2 cm, image 30/5. Previously 1.3 cm. Other:  No significant free fluid or fluid collections. Musculoskeletal: Metastatic lesion involving the L1 vertebra with mild superior endplate compression deformity is unchanged. IMPRESSION: 1. There is mild dilatation of the intrahepatic bile ducts to the right lobe of liver,  increased from previous exam. New moderate to severe dilatation of the intrahepatic bile ducts to the left lobe of liver. Dilated bile ducts converge within  the central liver. Suspect underlying malignant stricture secondary to the known large infiltrative tumor. The common bile duct is nondilated measuring up to 4 mm. No convincing evidence for choledocholithiasis. 2. Liver metastases are again noted. The dominant mass within the posterior right hepatic lobe is stable in size from previous exam. 3. Index upper abdominal adenopathy is unchanged. 4. Metastatic lesion involving the L1 vertebra with mild superior endplate compression deformity is unchanged. Electronically Signed   By: Signa Kell M.D.   On: 10/02/2022 05:38   MR ABDOMEN MRCP W WO CONTAST  Result Date: 10/02/2022 CLINICAL DATA:  Evaluate bile duct dilatation. Metastatic colon cancer EXAM: MRI ABDOMEN WITHOUT AND WITH CONTRAST (INCLUDING MRCP) TECHNIQUE: Multiplanar multisequence MR imaging of the abdomen was performed both before and after the administration of intravenous contrast. Heavily T2-weighted images of the biliary and pancreatic ducts were obtained, and three-dimensional MRCP images were rendered by post processing. CONTRAST:  7mL GADAVIST GADOBUTROL 1 MMOL/ML IV SOLN COMPARISON:  09/04/2022 FINDINGS: Lower chest: No acute findings. Hepatobiliary: Liver metastases are again noted. Dominant mass within the posterior right hepatic lobe measures 9.4 by 9.7 by 9.5 cm (volume = 450 cm^3), image 14/19. On the previous exam this measured 10.3 x 10.2 by 8.7 cm (volume = 480 cm^3). Lesion in the lateral segment of left hepatic lobe measures 0.8 cm, image 21/19. Unchanged from previous exam. Cyst within the inferior right lobe of liver unchanged measuring 2.1 cm, image 25/4. Multiple stones are identified within the gallbladder. The largest stone is in the gallbladder neck measuring 1.7 cm. The gallbladder wall appears diffusely edematous measuring 7 mm, image 11/3. There is mild dilatation of the intrahepatic bile ducts to the right lobe of liver, increased from previous exam. New moderate to severe  dilatation of the intrahepatic bile ducts to the left lobe of liver. Dilated bile ducts converge within the central liver. Suspect underlying malignant stricture secondary to infiltrative tumor. The common bile duct is nondilated measuring up to 4 mm. No convincing evidence for choledocholithiasis. Pancreas: No mass, inflammatory changes, or other parenchymal abnormality identified. Spleen: Stable 1.5 cm splenic lesion which is favored to represent a benign abnormality. Adrenals/Urinary Tract: Normal adrenal glands. Small cyst within inferior pole of left kidney is unchanged. No follow-up imaging recommended. No hydronephrosis. Stomach/Bowel: Stomach appears normal. Right abdominal loop ileostomy. No dilated bowel loops identified. Vascular/Lymphatic: Normal caliber of the abdominal aorta. Portal vein, portal venous confluence and SMV remain patent. Index upper abdominal adenopathy is again seen. Enlarged portacaval lymph node measures 1.6 cm short axis, image 16/5. Unchanged from previous exam. Aortocaval lymph node measures 1.2 cm, image 30/5. Previously 1.3 cm. Other:  No significant free fluid or fluid collections. Musculoskeletal: Metastatic lesion involving the L1 vertebra with mild superior endplate compression deformity is unchanged. IMPRESSION: 1. There is mild dilatation of the intrahepatic bile ducts to the right lobe of liver, increased from previous exam. New moderate to severe dilatation of the intrahepatic bile ducts to the left lobe of liver. Dilated bile ducts converge within the central liver. Suspect underlying malignant stricture secondary to the known large infiltrative tumor. The common bile duct is nondilated measuring up to 4 mm. No convincing evidence for choledocholithiasis. 2. Liver metastases are again noted. The dominant mass within the posterior right hepatic lobe is stable in size from previous exam. 3.  Index upper abdominal adenopathy is unchanged. 4. Metastatic lesion involving the  L1 vertebra with mild superior endplate compression deformity is unchanged. Electronically Signed   By: Signa Kell M.D.   On: 10/02/2022 05:38    Labs:  CBC: Recent Labs    10/02/22 1402 10/03/22 0348 10/04/22 0329 10/05/22 0356  WBC 3.8* 3.6* 4.3 5.7  HGB 9.7* 10.1* 10.3* 10.3*  HCT 31.4* 32.7* 33.3* 32.8*  PLT 158 163 175 172    COAGS: Recent Labs    03/15/22 0801 03/29/22 0820 10/02/22 1402  INR 1.0 1.0 1.2    BMP: Recent Labs    10/02/22 1402 10/03/22 0348 10/04/22 0329 10/05/22 0356  NA 138 138 136 136  K 2.7* 3.6 4.1 3.8  CL 108 109 106 106  CO2 23 21* 22 23  GLUCOSE 161* 96 108* 101*  BUN <5* <5* <5* 5*  CALCIUM 8.0* 7.8* 8.0* 8.1*  CREATININE 0.38* 0.37* <0.30* 0.36*  GFRNONAA >60 >60 NOT CALCULATED >60    LIVER FUNCTION TESTS: Recent Labs    10/02/22 1402 10/03/22 0348 10/04/22 0329 10/05/22 0356  BILITOT 6.3* 6.9* 8.0* 5.6*  AST 102* 101* 105* 71*  ALT 60* 55* 56* 45*  ALKPHOS 654* 641* 706* 595*  PROT 6.2* 6.2* 6.3* 6.4*  ALBUMIN 2.5* 2.4* 2.4* 2.4*    Assessment and Plan: 57 year old female with history of metastatic colon cancer to liver and L1 vertebral body. She is s/p port-A-Cath placement in 2022 and right hepatic Y90 radioembolization on 03/29/2022 . Status post laparoscopic loop ileostomy in March of this year; now with biliary obstruction/hyperbilirubinemia, s/p left I/E biliary drain 5/8; afebrile, WBC nl, hgb stable, t bili 5.6(8); bile cx pend; cont current tx, hydration, lab checks/drain irrigation; f/u cholangiogram once t bili reaches a nadir; if t bili rises consider f/u CT A/P; if pt goes home soon rec once daily flush of drain with 5 cc sterile saline, output recording and dressing change every 2-3 days   Electronically Signed: D. Jeananne Rama, PA-C 10/05/2022, 2:19 PM   I spent a total of 15 Minutes at the the patient's bedside AND on the patient's hospital floor or unit, greater than 50% of which was  counseling/coordinating care for biliary drain    Patient ID: Rebecca Bullock, female   DOB: 19-Apr-1966, 57 y.o.   MRN: [id number]

## 2022-10-05 NOTE — Progress Notes (Signed)
Thankfully, the bilirubin is starting to come down.  Today, the bilirubin is 5.6.  Hopefully, this will continue to improve.  I totally appreciate Palliative Care seeing her.  They are not really helping with the pain.  She is on a Dilaudid PCA right now.  She will then be converted over to something time-released.  I really think she can probably have regular food now.  She has good bowel sounds.  She is hungry.  She has had no fever.  There has been no bleeding.  She has had no vomiting.  Maybe a little nausea.  There is no cough or shortness of breath.  Her labs show her bilirubin to be 5.6.  Alkaline phosphatase is 595.  SGPT 45 SGOT 71.  BUN 5 creatinine 0.36.  Her albumin is 2.4.  Her white cell count is 5.7.  Hemoglobin 10.3.  Platelet count 172,000.  She is urinating without difficulty.  Her vital signs are temperature 98.4.  Pulse 99.  Blood pressure 124/82.  Head and neck exam shows no ocular or oral lesions.  There might be a little bit of scleral icterus.  Lungs are clear bilaterally.  Cardiac exam regular rate and rhythm.  Abdomen is soft.  Bowel sounds are present.  There is no fluid wave.  She has a ostomy intact.  She has the biliary catheter that is draining clear bile.  There is no obvious hepatomegaly.  Extremity shows no clubbing, cyanosis or edema.  Neurological exam shows no focal neurological deficits.  I would have to think that she is possible to be hospitalized for another day or so.  She is on the PCA.  She can had to be converted over to is some that is time-released that will help keep her comfortable.  Hopefully the bilirubin will continue to come down.  I would like to think that we could consider her for systemic chemotherapy next week.  Again, a lot will depend on when she is discharged and what her bilirubin comes down to.  I do appreciate the great care that she is getting from all the staff upon [address].  As always, the staff do a fantastic job with all of my  patients.  Happy Nurses Week!!!!  Christin Bach, MD  [place] 14:14

## 2022-10-05 NOTE — Plan of Care (Signed)
Patient alert and awake, orientx4. Forgetful at times. Patient requesting PRN pain med's  every 2 hours;  Dr. Rito Ehrlich noted.  Pain medication given; waiting for relief.  Remains on RA. Ileostomy with small amount of output today; patient self managing ostomy bag. See flow sheet for Biliary drain output. Patient up sitting in chair this shift. Family at bedside. Safety precautions maintained. Problem: Education: Goal: Knowledge of General Education information will improve Description: Including pain rating scale, medication(s)/side effects and non-pharmacologic comfort measures Outcome: Progressing   Problem: Health Behavior/Discharge Planning: Goal: Ability to manage health-related needs will improve Outcome: Progressing   Problem: Clinical Measurements: Goal: Ability to maintain clinical measurements within normal limits will improve Outcome: Progressing Goal: Will remain free from infection Outcome: Progressing Goal: Diagnostic test results will improve Outcome: Progressing Goal: Respiratory complications will improve Outcome: Progressing Goal: Cardiovascular complication will be avoided Outcome: Progressing   Problem: Activity: Goal: Risk for activity intolerance will decrease Outcome: Progressing   Problem: Nutrition: Goal: Adequate nutrition will be maintained Outcome: Progressing   Problem: Coping: Goal: Level of anxiety will decrease Outcome: Progressing   Problem: Elimination: Goal: Will not experience complications related to bowel motility Outcome: Progressing Goal: Will not experience complications related to urinary retention Outcome: Progressing   Problem: Pain Managment: Goal: General experience of comfort will improve Outcome: Progressing   Problem: Safety: Goal: Ability to remain free from injury will improve Outcome: Progressing   Problem: Skin Integrity: Goal: Risk for impaired skin integrity will decrease Outcome: Progressing

## 2022-10-05 NOTE — Progress Notes (Signed)
[facility] Progress Note  Patient: Rebecca Bullock    ZOX:[id number]  DOA: 10/02/2022    Date of Service: the patient was seen and examined on 10/05/2022  Brief hospital course: 57 year old female with past medical history of colon cancer with metastases to liver and L1 vertebra, iron deficiency anemia, obesity with recent hospitalization 2 months ago for bowel obstruction status post ileostomy who presented to the emergency room on 5/7 with complaints of emesis and increased jaundice over the past few days.  Patient had been seen by her oncologist the day prior and underwent MRI which noted signs concerning for biliary/hepatic obstruction.  In the emergency room, MRCP done noting underlying malignant stricture caused by known infiltrative tumor.  Case discussed with GI and it was felt that interventional radiology approach would be superior versus ERCP.  Following CT scan, patient underwent successful placement of transhepatic biliary drainage catheter by left hepatic approach.  Assessment and Plan: Hyperbilirubinemia caused by antibiotic bile duct obstruction secondary to metastatic colon cancer: Appreciate GI/oncology/interventional radiology help.  Status post biliary drain placement.  Transaminases, bilirubin trending downward.  Recheck labs in the morning.  Continued pain and appreciate adjustments by palliative care.  Will confirm with IR if drain is to be removed.  Hypokalemia: Replace as needed  Obesity: Meets criteria BMI greater than 30      Body mass index is 31.37 kg/m.        Consultants: Gastroenterology Oncology Interventional radiology Palliative care  Procedures: Status post biliary drainage catheter 5/8  Antimicrobials: Preop Levaquin x 1 dose 5/8  Code Status: Full code   Subjective: Still some abdominal pain  Objective: Stable vital signs Vitals:   10/05/22 0759 10/05/22 1302  BP:  125/82  Pulse:  92  Resp: 18 18  Temp:  98 F (36.7 C)  SpO2:  97% 96%    Intake/Output Summary (Last 24 hours) at 10/05/2022 1427 Last data filed at 10/05/2022 1000 Gross per 24 hour  Intake 38.9 ml  Output --  Net 38.9 ml    Filed Weights   10/02/22 1304  Weight: 75.3 kg   Body mass index is 31.37 kg/m.  Exam:  General: Alert and oriented x 3, no acute distress HEENT: Normocephalic atraumatic, mucous membranes slightly dry Cardiovascular: Regular rate and rhythm, S1-S2 Respiratory: Clear to auscultation bilaterally Abdomen: Soft, mildly diffusely tender, mild distention, hypoactive bowel sounds Musculoskeletal: No clubbing or cyanosis or edema Skin: Jaundiced Psychiatry: Appropriate, no evidence of psychoses Neurology: No focal deficits  Data Reviewed: Alkaline phosphatase down to 595, transaminases trending downward.  Bilirubin down to 5.6.  Disposition:  Status is: Inpatient Remains inpatient appropriate because:    Anticipated discharge date: 5/11  Remaining issues to be resolved so that patient can be discharged:  -Pain control -Improvement in obstructive jaundice   Family Communication: Updated family by phone. DVT Prophylaxis: enoxaparin (LOVENOX) injection 40 mg Start: 10/04/22 0800 SCDs Start: 10/02/22 1352    Author: Hollice Espy ,MD 10/05/2022 2:27 PM  To reach On-call, see care teams to locate the attending and reach out via [email or web address]. Between 7PM-7AM, please contact night-coverage If you still have difficulty reaching the attending provider, please page the [facility] (Director on Call) for [facility] on amion for assistance.

## 2022-10-05 NOTE — Progress Notes (Signed)
Daily Progress Note   Patient Name: Rebecca Bullock       Date: 10/05/2022 DOB: 22-Jun-1965  Age: 57 y.o. MRN#: [id number] Attending Physician: Hollice Espy, MD Primary Care Physician: Nolene Ebbs Admit Date: 10/02/2022  Reason for Consultation/Follow-up: Establishing goals of care, Non pain symptom management, and Pain control  Subjective: Chart Reviewed. Updates Received. Patient Assessed. No family present at the bedside. Daughters are on the way up to visit. Jamesha is sitting upright awake and alert in bed. No acute distress. Shares she had a decent night and have been able to get some rest. Just finished eating breakfast. Tray at bedside. She has eating 50% of french toast. Drinking lemonade. Tolerating without difficulty.   Shares she is hopeful to get home this weekend to celebrate Mothers Day.    Length of Stay: 3 days  Vital Signs: BP 124/82 (BP Location: Left Arm)   Pulse 99   Temp 98.4 F (36.9 C) (Oral)   Resp 18   Ht 5\' 1"  (1.549 m)   Wt 75.3 kg   SpO2 97%   BMI 31.37 kg/m  SpO2: SpO2: 97 % O2 Device: O2 Device: HeliOx Mask O2 Flow Rate: O2 Flow Rate (L/min): 0 L/min Last Weight  Most recent update: 10/02/2022  1:05 PM    Weight  75.3 kg (166 lb)            No intake or output data in the 24 hours ending 10/05/22 0920  Physical Exam: Gen:  NAD HEENT: moist mucous membranes CV: Regular rate and rhythm, no murmurs rubs or gallops PULM: clear to auscultation bilaterally. No wheezes/rales/rhonchi ABD: soft/tender/mildly distended/normal bowel sounds, ileostomy and drain in place EXT: No edema Neuro: Alert and oriented x4  Palliative Care Assessment & Plan  HPI: Palliative Care consult requested for goals of care discussion in this 57 y.o. female  with medical history of metastatic colon cancer with liver involvement, s/p ileostomy secondary to bowel obstruction (07/2022), vitamin D deficiency, and iron deficiency anemia. She was admitted on 10/02/2022  from home with with juandice, worsening abdominal pain, and emesis. Patient being followed by IR s/p left hepatic biliary drain placement and GI.    Code Status: Full code  Assessment Rebecca Bullock is feeling better today. Is hopeful she can get home over the weekend and celebrate Mothers Day with her family in addition to her scheduled treatment next week. Reports pain is controlled. Rates pain 3 out of 4.   We discussed at length current pain regimen. She is in agreement with discontinuing PCA and focusing on oral medications allowing for close evaluation and readiness for discharging home. Clora is tolerating oral medications and foods. Denies nausea, vomiting.   Will continue to closely monitor and adjust medications as needed. Patient verbalized understanding of plans to focus on oral regimen however IV rescue dosing available.   Recommendations/Plan: Continue with current plan of care per medical team. Patient and family are realistic in their understanding. They are remaining hopeful for some stability but also preparing for no improvement or further decline. Continue with chemotherapy while focusing on her quality of life.  Spiritual care referral to assist in advanced directives completion.  WOC RN have evaluated and orders placed for nursing staff to obtain supplies.  PMT will continue to support and follow on as needed basis. Please secure chat for urgent needs.   Symptom Management: Neoplasm related pain Dilaudid PCA 1.25mg /hr lockout. on review patient received  a total of 9.7  mg over past 24 hours.  Increase MS Contin 30 mg to every 8 hours  Baclofen 10 mg three times daily for spasms/cramping  Hydromorphone 4mg  every 3 hours as needed for breakthrough pain. TO BE ADMINISTERED FIRST BEFORE USE OF IV MEDICATION.  Hydromorphone 1mg  every 4 hours as needed if no relief with oral medications.  Patient understands goal is to manage pain on oral regimen in preparation of discharging  home.  Nausea Pepcid 20 mg twice daily  Ativan 0.5mg  every 4 hours as needed for anxiety/nausea Olanzapine 10mg  at bedtime  Zofran 4mg  every 6 hours as needed Scopolamine Patch every 72 hours Anxiety Ativan 0.5mg  every 4 hours as needed for anxiety  Thank you for allowing the Palliative Medicine Team to assist in the care of this patient.  Palliative Medicine Team providers are available by phone from 7am to 7pm daily and can be reached through the team cell phone. Should this patient require assistance outside of these hours, please call the patient's attending physician.  Any controlled substances utilized were prescribed in the context of palliative care. PDMP has been reviewed.  Visit consisted of counseling and education dealing with the complex and emotionally intense issues of symptom management and palliative care in the setting of serious and potentially life-threatening illness.Greater than 50%  of this time was spent counseling and coordinating care related to the above assessment and plan.  Willette Alma, AGPCNP-BC  [facility]  [phone]  *Please note that this is a verbal dictation therefore any spelling or grammatical errors are due to the "Dragon Medical One" system interpretation.

## 2022-10-06 DIAGNOSIS — K831 Obstruction of bile duct: Secondary | ICD-10-CM | POA: Diagnosis not present

## 2022-10-06 DIAGNOSIS — E669 Obesity, unspecified: Secondary | ICD-10-CM | POA: Diagnosis not present

## 2022-10-06 DIAGNOSIS — C189 Malignant neoplasm of colon, unspecified: Secondary | ICD-10-CM | POA: Diagnosis not present

## 2022-10-06 DIAGNOSIS — Z515 Encounter for palliative care: Secondary | ICD-10-CM | POA: Diagnosis not present

## 2022-10-06 DIAGNOSIS — G893 Neoplasm related pain (acute) (chronic): Secondary | ICD-10-CM

## 2022-10-06 DIAGNOSIS — Z7189 Other specified counseling: Secondary | ICD-10-CM

## 2022-10-06 LAB — COMPREHENSIVE METABOLIC PANEL
ALT: 37 U/L (ref 0–44)
AST: 50 U/L — ABNORMAL HIGH (ref 15–41)
Albumin: 2.6 g/dL — ABNORMAL LOW (ref 3.5–5.0)
Alkaline Phosphatase: 522 U/L — ABNORMAL HIGH (ref 38–126)
Anion gap: 11 (ref 5–15)
BUN: 8 mg/dL (ref 6–20)
CO2: 22 mmol/L (ref 22–32)
Calcium: 8.3 mg/dL — ABNORMAL LOW (ref 8.9–10.3)
Chloride: 106 mmol/L (ref 98–111)
Creatinine, Ser: 0.45 mg/dL (ref 0.44–1.00)
GFR, Estimated: >60 mL/min (ref >60)
Glucose, Bld: 124 mg/dL — ABNORMAL HIGH (ref 70–99)
Potassium: 3.1 mmol/L — ABNORMAL LOW (ref 3.5–5.1)
Sodium: 139 mmol/L (ref 135–145)
Total Bilirubin: 5 mg/dL — ABNORMAL HIGH (ref 0.3–1.2)
Total Protein: 6.8 g/dL (ref 6.5–8.1)

## 2022-10-06 MED ORDER — HYDROMORPHONE HCL 1 MG/ML IJ SOLN: 1.0000 mg | INTRAMUSCULAR | Status: DC | PRN

## 2022-10-06 MED ORDER — HYDROMORPHONE HCL 2 MG PO TABS
4.0000 mg | ORAL_TABLET | ORAL | Status: DC | PRN
  Administered 2022-10-06 – 2022-10-08 (×22): 4 mg via ORAL
  Filled 2022-10-06 (×22): qty 2

## 2022-10-06 NOTE — Progress Notes (Signed)
[facility] Progress Note  Patient: Rebecca Bullock    ZOX:[id number]  DOA: 10/02/2022    Date of Service: the patient was seen and examined on 10/06/2022  Brief hospital course: 57 year old female with past medical history of colon cancer with metastases to liver and L1 vertebra, iron deficiency anemia, obesity with recent hospitalization 2 months ago for bowel obstruction status post ileostomy who presented to the emergency room on 5/7 with complaints of emesis and increased jaundice over the past few days.  Patient had been seen by her oncologist the day prior and underwent MRI which noted signs concerning for biliary/hepatic obstruction.  In the emergency room, MRCP done noting underlying malignant stricture caused by known infiltrative tumor.  Case discussed with GI and it was felt that interventional radiology approach would be superior versus ERCP.  Following CT scan, patient underwent successful placement of transhepatic biliary drainage catheter by left hepatic approach.  Assessment and Plan: Hyperbilirubinemia caused by antibiotic bile duct obstruction secondary to metastatic colon cancer: Appreciate GI/oncology/interventional radiology help.  Status post biliary drain placement.  Transaminases, bilirubin continue to trend downward.  Bilirubin down to 5.0 today.  Continue to follow.  If this level is off without substantial improvement in bilirubin, radiology may need to place separate drain on the other side of the liver. Hypokalemia: Replace as needed  Obesity: Meets criteria BMI greater than 30  Pain management: Appreciate palliative care help.  It was clarified that she recently, but before admission, had had her MS Contin changed from every 12 hours to every 8 hours.  During hospitalization, patient has been getting it every 12 hours so changed to every 8 hours on the evening of 5/11.  Patient hopefully help with her pain medication.  We reviewed how each of her medications work,  (scheduled Versed as needed, extended release versus conservatively for concerns of pain medication).  Not much relief with lidocaine patch.   Body mass index is 31.37 kg/m.        Consultants: Gastroenterology Oncology Interventional radiology Palliative care  Procedures: Status post biliary drainage catheter 5/8  Antimicrobials: Preop Levaquin x 1 dose 5/8  Code Status: Full code   Subjective: Still a fair amount of both abdominal and lower back pain  Objective: Stable vital signs Vitals:   10/05/22 2029 10/06/22 0428  BP: 127/79 125/77  Pulse: 94 90  Resp: 19 18  Temp: 98.7 F (37.1 C) 98.5 F (36.9 C)  SpO2: 96% 95%    Intake/Output Summary (Last 24 hours) at 10/06/2022 1130 Last data filed at 10/05/2022 1800 Gross per 24 hour  Intake 490 ml  Output 300 ml  Net 190 ml    Filed Weights   10/02/22 1304  Weight: 75.3 kg   Body mass index is 31.37 kg/m.  Exam:  General: Alert and oriented x 3, no acute distress HEENT: Normocephalic atraumatic, mucous membranes slightly dry Cardiovascular: Regular rate and rhythm, S1-S2 Respiratory: Clear to auscultation bilaterally Abdomen: Soft, mildly diffusely tender, mild distention, hypoactive bowel sounds Musculoskeletal: No clubbing or cyanosis or edema Skin: Mildly jaundiced Psychiatry: Appropriate, no evidence of psychoses Neurology: No focal deficits  Data Reviewed: Alkaline phosphatase down to 122, albumin 2.6, transaminases almost normalized.  Potassium of 3.1 and bilirubin down to 5.  Disposition:  Status is: Inpatient Remains inpatient appropriate because:    Anticipated discharge date: 5/13  Remaining issues to be resolved so that patient can be discharged:  -Pain control -Improvement in obstructive jaundice, or addition of second hepatic  drain   Family Communication: Updated daughter by phone, boyfriend at bedside DVT Prophylaxis: enoxaparin (LOVENOX) injection 40 mg Start: 10/04/22  0800 SCDs Start: 10/02/22 1352    Author: Hollice Espy ,MD 10/06/2022 11:30 AM  To reach On-call, see care teams to locate the attending and reach out via [email or web address]. Between 7PM-7AM, please contact night-coverage If you still have difficulty reaching the attending provider, please page the [facility] (Director on Call) for [facility] on amion for assistance.

## 2022-10-06 NOTE — Plan of Care (Signed)
Patient stated that she is feeling much better today. Remains on RA. Pain managed with schedule and PRN meds. Tolerating diet. Up sitting in chair for most of the shift. Patient ambulating in hallway this shift. Safety precautions maintained. Problem: Education: Goal: Knowledge of General Education information will improve Description: Including pain rating scale, medication(s)/side effects and non-pharmacologic comfort measures Outcome: Not Progressing   Problem: Health Behavior/Discharge Planning: Goal: Ability to manage health-related needs will improve Outcome: Not Progressing   Problem: Clinical Measurements: Goal: Ability to maintain clinical measurements within normal limits will improve Outcome: Not Progressing Goal: Will remain free from infection Outcome: Not Progressing Goal: Diagnostic test results will improve Outcome: Not Progressing Goal: Respiratory complications will improve Outcome: Not Progressing Goal: Cardiovascular complication will be avoided Outcome: Not Progressing   Problem: Activity: Goal: Risk for activity intolerance will decrease Outcome: Not Progressing   Problem: Nutrition: Goal: Adequate nutrition will be maintained Outcome: Not Progressing   Problem: Coping: Goal: Level of anxiety will decrease Outcome: Not Progressing   Problem: Elimination: Goal: Will not experience complications related to bowel motility Outcome: Not Progressing Goal: Will not experience complications related to urinary retention Outcome: Not Progressing   Problem: Pain Managment: Goal: General experience of comfort will improve Outcome: Not Progressing   Problem: Safety: Goal: Ability to remain free from injury will improve Outcome: Not Progressing   Problem: Skin Integrity: Goal: Risk for impaired skin integrity will decrease Outcome: Not Progressing

## 2022-10-06 NOTE — Progress Notes (Signed)
Her bilirubin continues to improve.  The bilirubin is now down to 5.0.  Her potassium is a bit low at 3.1.  BUN 8 creatinine is 0.45.  Calcium 8.3 with an albumin of 2.6.  She is eating a little bit better.  Pain wise, she still may have some issues.  I know that Palliative Care has been helping her with this.  She is now off the PCA.  I think she is still a bit worried about having to go home.  She is worried about pain control at home.  I do not think it be a bad idea to have her stay 1 or 2 more days just to give her the comfort to know that she will be able to have good pain control.  She has had no fever.  She is out of bed going to the bathroom.  Her colostomy is working well.  I do appreciate IR help that with the biliary catheter.  Her temperature is 98.5.  Pulse 90.  Blood pressure 125/77.  Her head and neck exam shows no ocular or oral lesions.  She still has little bit of scleral icterus.  Her lungs are clear bilaterally.  Cardiac exam regular rate and rhythm.  She has no murmurs.  Abdomen is soft.  She has a biliary catheter intact.  It is draining clear yellow bile.  She has a colostomy that is intact.  Bowel sounds are present.  There is no guarding or rebound tenderness.  Extremity shows no clubbing, cyanosis or edema.  Neurological exam is nonfocal.  Ms. Kravchenko has metastatic colorectal cancer.  She had biliary obstruction that is intrahepatic.  She had the biliary catheter placed.  This is helping.  The bilirubin is coming down.  Again, I think pain control is her biggest concern.  It be nice if she could stay another day or so just to make sure that she is having adequate pain control.  I do appreciate everybody's help.  I know the staff on [address] have continued to do a fantastic job with her.   Christin Bach, MD  Psalm 6:2

## 2022-10-06 NOTE — Progress Notes (Signed)
Daily Progress Note   Patient Name: Rebecca Bullock       Date: 10/06/2022 DOB: 1966-02-22  Age: 57 y.o. MRN#: [id number] Attending Physician: Hollice Espy, MD Primary Care Physician: Nolene Ebbs Admit Date: 10/02/2022  Reason for Consultation/Follow-up: Establishing goals of care, Non pain symptom management, and Pain control  Subjective: Chart Reviewed. Updates Received. Patient Assessed. Son, daughter, and Significant Other at bedside. Glory states she is feeling much better this afternoon. She has walked in the halls and have been sitting up in recliner today. States she is tolerating activity well. No increase in pain. Is much appreciative of this. Appetite is good. Labs continue to trend down. She was hopeful to discharge home to be with family for Mother's Day but also understands why she is still admitted.   Length of Stay: 4 days  Vital Signs: BP 118/74 (BP Location: Left Arm)   Pulse 83   Temp 98.2 F (36.8 C)   Resp 17   Ht 5\' 1"  (1.549 m)   Wt 75.3 kg   SpO2 94%   BMI 31.37 kg/m  SpO2: SpO2: 94 % O2 Device: O2 Device: Room Air O2 Flow Rate: O2 Flow Rate (L/min): 0 L/min Last Weight  Most recent update: 10/02/2022  1:05 PM    Weight  75.3 kg (166 lb)             Intake/Output Summary (Last 24 hours) at 10/06/2022 1421 Last data filed at 10/05/2022 1800 Gross per 24 hour  Intake 250 ml  Output 300 ml  Net -50 ml    Physical Exam: Gen:  NAD CV: Regular rate and rhythm, no murmurs rubs or gallops PULM: clear to auscultation bilaterally. No wheezes/rales/rhonchi ABD: soft/tender/mildly distended/normal bowel sounds, ileostomy and drain in place EXT: No edema Neuro: Alert and oriented x4  Palliative Care Assessment & Plan  HPI: Palliative Care consult requested for goals of care discussion in this 57 y.o. female  with medical history of metastatic colon cancer with liver involvement, s/p ileostomy secondary to bowel obstruction (07/2022), vitamin D  deficiency, and iron deficiency anemia. She was admitted on 10/02/2022 from home with with juandice, worsening abdominal pain, and emesis. Patient being followed by IR s/p left hepatic biliary drain placement and GI.    Code Status: Full code  Assessment [name] is feeling much better today. Is hopeful she can get home over the weekend and celebrate Mothers Day with her family in addition to her scheduled treatment next week. Reports pain is controlled. MS Contin increased yesterday back to home dose. She and family reports a noticeable difference today. Intisar states she had a good night last night (best since admission). Was able to sleep most of the night without need for medication. Some discomfort this morning when she woke up however this has improved and controlled as the day went on.   We discussed at length current pain regimen, adjustments, and scheduling.   Will continue to closely monitor and adjust medications as needed. Patient verbalized understanding of plans to focus on oral regimen however IV rescue dosing available.   Recommendations/Plan: Continue with current plan of care per medical team. Patient and family are realistic in their understanding. They are remaining hopeful for some stability but also preparing for no improvement or further decline. Continue with chemotherapy while focusing on her quality of life.  Spiritual care referral to assist in advanced directives completion. If not done will arrange for assistance at [facility] outpatient.  WOC RN have evaluated and orders placed for nursing staff to obtain supplies.  PMT will continue to support and follow on as needed basis. Please secure chat for urgent needs. Patient and family aware I will be off service tomorrow however a colleague is available for urgent needs.    Symptom Management: Neoplasm related pain MS Contin 30 mg to every 8 hours  Baclofen 10 mg three times daily for spasms/cramping  Hydromorphone  4mg  every 2 hours as needed for breakthrough pain. TO BE ADMINISTERED FIRST BEFORE USE OF IV MEDICATION.  Hydromorphone 1mg  every 4 hours as needed if no relief with oral medications.  Patient understands goal is to manage pain on oral regimen in preparation of discharging home.  Nausea Pepcid 20 mg twice daily  Ativan 0.5mg  every 4 hours as needed for anxiety/nausea Olanzapine 10mg  at bedtime  Zofran 4mg  every 6 hours as needed Scopolamine Patch every 72 hours Anxiety Ativan 0.5mg  every 4 hours as needed for anxiety  Thank you for allowing the Palliative Medicine Team to assist in the care of this patient.  Palliative Medicine Team providers are available by phone from 7am to 7pm daily and can be reached through the team cell phone. Should this patient require assistance outside of these hours, please call the patient's attending physician.  Any controlled substances utilized were prescribed in the context of palliative care. PDMP has been reviewed.  Visit consisted of counseling and education dealing with the complex and emotionally intense issues of symptom management and palliative care in the setting of serious and potentially life-threatening illness.Greater than 50%  of this time was spent counseling and coordinating care related to the above assessment and plan.  Willette Alma, AGPCNP-BC  [facility]  [phone]  *Please note that this is a verbal dictation therefore any spelling or grammatical errors are due to the "Dragon Medical One" system interpretation.

## 2022-10-07 DIAGNOSIS — E669 Obesity, unspecified: Secondary | ICD-10-CM | POA: Diagnosis not present

## 2022-10-07 DIAGNOSIS — K831 Obstruction of bile duct: Secondary | ICD-10-CM | POA: Diagnosis not present

## 2022-10-07 DIAGNOSIS — C189 Malignant neoplasm of colon, unspecified: Secondary | ICD-10-CM | POA: Diagnosis not present

## 2022-10-07 LAB — COMPREHENSIVE METABOLIC PANEL
ALT: 30 U/L (ref 0–44)
AST: 40 U/L (ref 15–41)
Albumin: 2.4 g/dL — ABNORMAL LOW (ref 3.5–5.0)
Alkaline Phosphatase: 438 U/L — ABNORMAL HIGH (ref 38–126)
Anion gap: 10 (ref 5–15)
BUN: 7 mg/dL (ref 6–20)
CO2: 20 mmol/L — ABNORMAL LOW (ref 22–32)
Calcium: 7.6 mg/dL — ABNORMAL LOW (ref 8.9–10.3)
Chloride: 103 mmol/L (ref 98–111)
Creatinine, Ser: 0.39 mg/dL — ABNORMAL LOW (ref 0.44–1.00)
GFR, Estimated: >60 mL/min (ref >60)
Glucose, Bld: 142 mg/dL — ABNORMAL HIGH (ref 70–99)
Potassium: 2.9 mmol/L — ABNORMAL LOW (ref 3.5–5.1)
Sodium: 133 mmol/L — ABNORMAL LOW (ref 135–145)
Total Bilirubin: 4.2 mg/dL — ABNORMAL HIGH (ref 0.3–1.2)
Total Protein: 6.7 g/dL (ref 6.5–8.1)

## 2022-10-07 LAB — BODY FLUID CULTURE W GRAM STAIN: Special Requests: NORMAL

## 2022-10-07 MED ORDER — POTASSIUM CHLORIDE CRYS ER 20 MEQ PO TBCR
40.0000 meq | EXTENDED_RELEASE_TABLET | ORAL | Status: AC
  Administered 2022-10-07 (×2): 40 meq via ORAL
  Filled 2022-10-07 (×2): qty 2

## 2022-10-07 NOTE — Progress Notes (Signed)
[facility] Progress Note  Patient: Rebecca Bullock    ZOX:[id number]  DOA: 10/02/2022    Date of Service: the patient was seen and examined on 10/07/2022  Brief hospital course: 57 year old female with past medical history of colon cancer with metastases to liver and L1 vertebra, iron deficiency anemia, obesity with recent hospitalization 2 months ago for bowel obstruction status post ileostomy who presented to the emergency room on 5/7 with complaints of emesis and increased jaundice over the past few days.  Patient had been seen by her oncologist the day prior and underwent MRI which noted signs concerning for biliary/hepatic obstruction.  In the emergency room, MRCP done noting underlying malignant stricture caused by known infiltrative tumor.  Case discussed with GI and it was felt that interventional radiology approach would be superior versus ERCP.  Following CT scan, patient underwent successful placement of transhepatic biliary drainage catheter by left hepatic approach.  Assessment and Plan: Hyperbilirubinemia caused by antibiotic bile duct obstruction secondary to metastatic colon cancer: Appreciate GI/oncology/interventional radiology help.  Status post biliary drain placement.  Transaminases, bilirubin continue to trend downward.  5/12 labs are pending.  Continue to follow.  If this level is off without substantial improvement in bilirubin, radiology may need to place separate drain on the other side of the liver.  Hypokalemia: Replace as needed  Obesity: Meets criteria BMI greater than 30  Pain management: Appreciate palliative care help.  It was clarified that she recently, but before admission, had had her MS Contin changed from every 12 hours to every 8 hours.  During hospitalization, patient has been getting it every 12 hours so changed to every 8 hours on the evening of 5/11.  She actually has been managing well and IV came out and she has been able to get by on oral medication  alone.  Body mass index is 31.46 kg/m.        Consultants: Gastroenterology Oncology Interventional radiology Palliative care  Procedures: Status post biliary drainage catheter 5/8  Antimicrobials: Preop Levaquin x 1 dose 5/8  Code Status: Full code   Subjective: Some mild pain with waking up, but overall pain better.  Objective: Stable vital signs Vitals:   10/06/22 2154 10/07/22 0504  BP: 125/75 112/82  Pulse: 87 84  Resp: 17 16  Temp: 98.6 F (37 C) 98.4 F (36.9 C)  SpO2: 97% 96%    Intake/Output Summary (Last 24 hours) at 10/07/2022 1204 Last data filed at 10/07/2022 0930 Gross per 24 hour  Intake 605 ml  Output 600 ml  Net 5 ml    Filed Weights   10/02/22 1304 10/06/22 1928  Weight: 75.3 kg 75.5 kg   Body mass index is 31.46 kg/m.  Exam:  General: Alert and oriented x 3, no acute distress HEENT: Normocephalic atraumatic, mucous membranes slightly dry Cardiovascular: Regular rate and rhythm, S1-S2 Respiratory: Clear to auscultation bilaterally Abdomen: Soft, mildly diffusely tender, mild distention, hypoactive bowel sounds Musculoskeletal: No clubbing or cyanosis or edema Skin: Mildly jaundiced Psychiatry: Appropriate, no evidence of psychoses Neurology: No focal deficits  Data Reviewed: Labs are pending  Disposition:  Status is: Inpatient  Anticipated discharge date: 5/13  Remaining issues to be resolved so that patient can be discharged:  -Finalizing pain regimen on oral medications -Improvement in obstructive jaundice, or addition of second hepatic drain   Family Communication: Boyfriend at bedside DVT Prophylaxis: enoxaparin (LOVENOX) injection 40 mg Start: 10/04/22 0800 SCDs Start: 10/02/22 1352    Author: Hollice Espy ,MD  10/07/2022 12:04 PM  To reach On-call, see care teams to locate the attending and reach out via [email or web address]. Between 7PM-7AM, please contact night-coverage If you still have difficulty reaching  the attending provider, please page the [facility] (Director on Call) for [facility] on amion for assistance.

## 2022-10-07 NOTE — Plan of Care (Signed)

## 2022-10-07 NOTE — Plan of Care (Signed)
  Problem: Education: Goal: Knowledge of General Education information will improve Description: Including pain rating scale, medication(s)/side effects and non-pharmacologic comfort measures Outcome: Progressing   Problem: Clinical Measurements: Goal: Ability to maintain clinical measurements within normal limits will improve Outcome: Progressing   Problem: Clinical Measurements: Goal: Will remain free from infection Outcome: Progressing   Problem: Clinical Measurements: Goal: Respiratory complications will improve Outcome: Progressing   Problem: Coping: Goal: Level of anxiety will decrease Outcome: Progressing   Problem: Activity: Goal: Risk for activity intolerance will decrease Outcome: Progressing   Problem: Pain Managment: Goal: General experience of comfort will improve Outcome: Progressing   Problem: Skin Integrity: Goal: Risk for impaired skin integrity will decrease Outcome: Progressing

## 2022-10-08 ENCOUNTER — Other Ambulatory Visit (HOSPITAL_BASED_OUTPATIENT_CLINIC_OR_DEPARTMENT_OTHER): Payer: Self-pay

## 2022-10-08 ENCOUNTER — Encounter: Payer: Self-pay | Admitting: Family

## 2022-10-08 DIAGNOSIS — Z515 Encounter for palliative care: Secondary | ICD-10-CM

## 2022-10-08 DIAGNOSIS — E876 Hypokalemia: Secondary | ICD-10-CM

## 2022-10-08 DIAGNOSIS — C189 Malignant neoplasm of colon, unspecified: Secondary | ICD-10-CM | POA: Diagnosis not present

## 2022-10-08 DIAGNOSIS — K831 Obstruction of bile duct: Secondary | ICD-10-CM | POA: Diagnosis not present

## 2022-10-08 LAB — COMPREHENSIVE METABOLIC PANEL
ALT: 26 U/L (ref 0–44)
AST: 39 U/L (ref 15–41)
Albumin: 2.4 g/dL — ABNORMAL LOW (ref 3.5–5.0)
Alkaline Phosphatase: 407 U/L — ABNORMAL HIGH (ref 38–126)
Anion gap: 9 (ref 5–15)
BUN: <5 mg/dL — ABNORMAL LOW (ref 6–20)
CO2: 21 mmol/L — ABNORMAL LOW (ref 22–32)
Calcium: 8 mg/dL — ABNORMAL LOW (ref 8.9–10.3)
Chloride: 102 mmol/L (ref 98–111)
Creatinine, Ser: 0.31 mg/dL — ABNORMAL LOW (ref 0.44–1.00)
GFR, Estimated: >60 mL/min (ref >60)
Glucose, Bld: 111 mg/dL — ABNORMAL HIGH (ref 70–99)
Potassium: 3.8 mmol/L (ref 3.5–5.1)
Sodium: 132 mmol/L — ABNORMAL LOW (ref 135–145)
Total Bilirubin: 3.7 mg/dL — ABNORMAL HIGH (ref 0.3–1.2)
Total Protein: 6.6 g/dL (ref 6.5–8.1)

## 2022-10-08 LAB — BODY FLUID CULTURE W GRAM STAIN

## 2022-10-08 LAB — MAGNESIUM: Magnesium: 2.1 mg/dL (ref 1.7–2.4)

## 2022-10-08 MED ORDER — HYDROMORPHONE HCL 4 MG PO TABS
4.0000 mg | ORAL_TABLET | ORAL | 0 refills | Status: DC | PRN
  Filled 2022-10-08: qty 160, 14d supply, fill #0

## 2022-10-08 MED ORDER — MORPHINE SULFATE ER 30 MG PO TBCR: 30.0000 mg | EXTENDED_RELEASE_TABLET | Freq: Three times a day (TID) | ORAL | 0 refills | Status: DC

## 2022-10-08 MED ORDER — HEPARIN SOD (PORK) LOCK FLUSH 100 UNIT/ML IV SOLN
500.0000 [IU] | INTRAVENOUS | Status: AC | PRN
  Administered 2022-10-08: 500 [IU]
  Filled 2022-10-08: qty 5

## 2022-10-08 MED ORDER — HEPARIN SOD (PORK) LOCK FLUSH 100 UNIT/ML IV SOLN
500.0000 [IU] | Freq: Once | INTRAVENOUS | Status: DC
  Filled 2022-10-08 (×2): qty 5

## 2022-10-08 MED ORDER — ONDANSETRON HCL 4 MG PO TABS
4.0000 mg | ORAL_TABLET | Freq: Four times a day (QID) | ORAL | 3 refills | Status: DC
  Filled 2022-10-08: qty 30, 8d supply, fill #0

## 2022-10-08 NOTE — Progress Notes (Signed)
Hopefully, Rebecca Bullock will be able to go home today.  Her bilirubin has come down to 3.7.  This is a very nice improvement.  The biliary drainage catheter is working quite nicely.  She is eating.  There is no vomiting.  Her colostomy is working nicely.  She is still having some pain issues.  However, she thinks that she can manage that at home.  It would be nice if she could go home.  Her labs today show sodium 132.  Potassium 3.8.  BUN less than 5 creatinine 0.31.  Her bilirubin is 3.7.  Albumin is 2.4.  She is out of bed.  She walked quite a bit yesterday.  She has had no fever.  There has been no bleeding.  Her vital signs show temperature of 97.6.  Pulse 98.  Blood pressure 148/92.  Her lungs sound clear bilaterally.  Ocular exam does not show any obvious scleral icterus.  She has no palatal icterus.  Cardiac exam is regular rate and rhythm.  Abdomen is soft.  Bowel sounds are present.  The ostomy is functioning.  The biliary drainage catheter is intact.  There is no fluid wave.  Extremities shows no clubbing, cyanosis or edema.  Neurological exam shows no focal neurological deficits.  Rebecca Bullock had biliary obstruction when she arrived.  This was from hepatic metastasis from her colon cancer.  She had a biliary catheter placed by IR.  As always, they did a fantastic job.  Again, hopefully she will be able to go home today.  I was thing that she is going need some kind of home care to try to help with the biliary drainage catheter.  I probably would get treatment started on her next week for the colon cancer.  I think she needs a week off at home after being in the hospital just to recover, get her strength back, make sure that she is eating well.  I do appreciate the great care that she received from everybody on 5 W.  Christin Bach, MD  Psalm 91:1-2

## 2022-10-08 NOTE — Plan of Care (Signed)
  Problem: Clinical Measurements: Goal: Ability to maintain clinical measurements within normal limits will improve Outcome: Progressing   Problem: Clinical Measurements: Goal: Will remain free from infection Outcome: Progressing   Problem: Elimination: Goal: Will not experience complications related to bowel motility Outcome: Progressing   Problem: Pain Managment: Goal: General experience of comfort will improve Outcome: Progressing   Problem: Safety: Goal: Ability to remain free from injury will improve Outcome: Progressing

## 2022-10-08 NOTE — Plan of Care (Signed)

## 2022-10-08 NOTE — Discharge Summary (Signed)
Physician Discharge Summary   Patient: Rebecca Bullock MRN: [id number] DOB: 1965/12/23  Admit date:     10/02/2022  Discharge date: 10/08/22  Discharge Physician: Hollice Espy   PCP: Jomarie Longs, PA-C   Recommendations at discharge:   Patient given refill for Dilaudid 4 mg every 2 hours as needed Medication clarification: Although it looks like MS Contin 30 mg was changed during hospitalization from every 12 hours to every 8 hours, this change it actually occurred right before patient was hospitalized, so clarification on medication only.  No new prescription given. Patient given new prescription for Zofran Patient will follow-up with her oncologist  Discharge Diagnoses: Principal Problem:   Bile duct obstruction, intrahepatic Active Problems:   Colon cancer metastasized to liver (HCC)   Pancytopenia (HCC)   Hypokalemia   Hyperbilirubinemia   Palliative care patient  Resolved Problems:   * No resolved hospital problems. *  Hospital Course: 57 year old female with past medical history of colon cancer with metastases to liver and L1 vertebra, iron deficiency anemia, obesity with recent hospitalization 2 months ago for bowel obstruction status post ileostomy who presented to the emergency room on 5/7 with complaints of emesis and increased jaundice over the past few days.  Patient had been seen by her oncologist the day prior and underwent MRI which noted signs concerning for biliary/hepatic obstruction.  In the emergency room, MRCP done noting underlying malignant stricture caused by known infiltrative tumor.  Case discussed with GI and it was felt that interventional radiology approach would be superior versus ERCP.   Following CT scan, patient underwent successful placement of transhepatic biliary drainage catheter by left hepatic approach.  Assessment and Plan: Hyperbilirubinemia caused by antibiotic bile duct obstruction secondary to metastatic colon cancer: Appreciate  GI/oncology/interventional radiology help.  Status post biliary drain placement.  Transaminases, bilirubin continue to trend downward.  By 5/13, bilirubin down to 3.7, down from a high of 8.0 on 5/9.  Drain is to be left for another 3 to 5 weeks.  Oncology can schedule repeat CT if desired prior to drain removal   Hypokalemia: Replace as needed   Obesity: Meets criteria BMI greater than 30   Pain management: Appreciate palliative care help.  It was clarified that she recently, but before admission, had had her MS Contin changed from every 12 hours to every 8 hours.  During hospitalization, patient has been getting it every 12 hours so changed to every 8 hours on the evening of 5/11.  She actually has been managing well and IV came out on evening of 5/11 and she has been able to get by on oral medication since.       Pain control - [employer] Controlled Substance Reporting System database was reviewed. and patient was instructed, not to drive, operate heavy machinery, perform activities at heights, swimming or participation in water activities or provide baby-sitting services while on Pain, Sleep and Anxiety Medications; until their outpatient Physician has advised to do so again. Also recommended to not to take more than prescribed Pain, Sleep and Anxiety Medications.   Consultants: Gastroenterology Oncology Interventional radiology Palliative care   Procedures: Status post biliary drainage catheter 5/8  Disposition: Home Diet recommendation:  Discharge Diet Orders (From admission, onward)     Start     Ordered   10/08/22 0000  Diet general        10/08/22 1242           Regular diet DISCHARGE MEDICATION: Allergies as of  10/08/2022       Reactions   Augmentin [amoxicillin-pot Clavulanate] Diarrhea, Other (See Comments)   Extreme diarrhea, abdominal pain.   Irinotecan Other (See Comments)   Flushing, tingling, visual disturbance   Metronidazole Diarrhea, Other (See  Comments)   Vancomycin Rash, Other (See Comments)   "Red Man Syndrome"   Latex Rash   Tape Rash   Wound Dressing Adhesive Rash        Medication List     STOP taking these medications    doxycycline 100 MG tablet Commonly known as: VIBRA-TABS       TAKE these medications    baclofen 10 MG tablet Commonly known as: LIORESAL Take 1 tablet (10 mg total) by mouth 3 (three) times daily.   famotidine 40 MG tablet Commonly known as: PEPCID Take 1 tablet (40 mg total) by mouth 2 (two) times daily.   Fruzaqla 1 MG capsule Generic drug: fruquintinib Take 3 capsules (3 mg total) by mouth daily. Take for 21 days on, then off for 7 days. Repeat every 28 days.   HYDROmorphone 4 MG tablet Commonly known as: Dilaudid Take 1 tablet (4 mg total) by mouth every 2 (two) hours as needed for severe pain. What changed:  when to take this additional instructions   LORazepam 0.5 MG tablet Commonly known as: ATIVAN Place 1 tablet (0.5 mg total) under the tongue every 4 (four) hours as needed for anxiety.   megestrol 40 MG/ML suspension Commonly known as: MEGACE Take 10 mLs (400 mg total) by mouth 2 (two) times daily.   meloxicam 15 MG tablet Commonly known as: MOBIC Take 1 tablet (15 mg total) by mouth daily.   morphine 30 MG 12 hr tablet Commonly known as: MS CONTIN Take 1 tablet (30 mg total) by mouth every 8 (eight) hours. What changed: when to take this   OLANZapine 10 MG tablet Commonly known as: ZYPREXA Take 1 tablet (10 mg total) by mouth at bedtime.   ondansetron 4 MG tablet Commonly known as: ZOFRAN Take 1 tablet (4 mg total) by mouth every 6 (six) hours.   Transderm-Scop 1 MG/3DAYS Generic drug: scopolamine Place one patch behind the ear every three days.               Discharge Care Instructions  (From admission, onward)           Start     Ordered   10/08/22 0000  Discharge wound care:       Comments: Change dressing around drain daily. Do not  use scissors near the catheter   10/08/22 1242            Discharge Exam: Filed Weights   10/02/22 1304 10/06/22 1928  Weight: 75.3 kg 75.5 kg   General: Alert and oriented x 3, no acute distress Cardiovascular: Regular rate and rhythm, S1-S2  Condition at discharge: Acutely improved although given her extensive cancer diagnosis, long-term prognosis is limited  The results of significant diagnostics from this hospitalization (including imaging, microbiology, ancillary and laboratory) are listed below for reference.   Imaging Studies: IR INT EXT BILIARY DRAIN WITH CHOLANGIOGRAM  Result Date: 10/03/2022 INDICATION: History of metastatic colon cancer, now with biliary obstruction. Patient has been evaluated by the GI service and deemed a poor candidate for endoscopy. As such, the patient presents today for image guided placement of a biliary drainage catheter. EXAM: ULTRASOUND AND FLUOROSCOPIC GUIDED PERCUTANEOUS TRANSHEPATIC CHOLANGIOGRAM AND BILIARY TUBE PLACEMENT COMPARISON:  CT abdomen pelvis-earlier same day;  MRCP-10/01/2022. MEDICATIONS: 500 mg levofloxacin; The antibiotic was administered with an appropriate time frame prior to the initiation of the procedure CONTRAST:  20mL OMNIPAQUE IOHEXOL 300 MG/ML SOLN - administered into the biliary tree. ANESTHESIA/SEDATION: Moderate (conscious) sedation was employed during this procedure. A total of Benadryl 25 mg, Versed 4 mg, Dilaudid 1 mg and Fentanyl 200 mcg was administered intravenously. Moderate Sedation Time: 60 minutes. The patient's level of consciousness and vital signs were monitored continuously by radiology nursing throughout the procedure under my direct supervision. FLUOROSCOPY TIME:  145 mGy COMPLICATIONS: None immediate. TECHNIQUE: Informed written consent was obtained from the patient after a discussion of the risks, benefits and alternatives to treatment. Questions regarding the procedure were encouraged and answered. A timeout  was performed prior to the initiation of the procedure. Midline of the abdomen was evaluated by the performing interventional radiologist demonstrated moderate dilatation of the left intrahepatic biliary system as demonstrated on preceding abdominal CT. As such, midline of the abdomen was prepped and draped in the usual sterile fashion, and a sterile drape was applied covering the operative field. Maximum barrier sterile technique with sterile gowns and gloves were used for the procedure. A timeout was performed prior to the initiation of the procedure. Under direct ultrasound guidance, moderately dilated duct within the peripheral aspect of the left lobe liver was accessed with a 22 gauge Chiba needle. Contrast injection confirmed appropriate intra biliary puncture however there was difficulty cannulating a microwire within the bile duct. Several additional attempts were made to access the left intrahepatic biliary system which was ultimately successfully achieved with advancement of a Nitrex wire to the level of the CBD. Under fluoroscopic guidance, the access needle was exchanged for an Accustick set which was advanced to the level of the CBD. Contrast injection confirmed appropriate positioning. Next, with the use of a stiff glidewire, a 4 French angled glide catheter was advanced through the Accustick catheter to the level of the duodenum. Contrast injection confirmed appropriate positioning Amplatz wire was coiled within the horizontal segment of the duodenum and under intermittent fluoroscopic guidance, the track was dilated, ultimately allowing placement of a 10 [place] biliary type drainage catheter with end coiled and locked within the horizontal segment of the duodenum and radiopaque marker at the level of the central aspect of the left intrahepatic biliary tree. Limited contrast injection was performed and spot radiographic images were obtained in various obliquities. At this point, the drainage catheter  was flushed with a small amount of saline and connected gravity bag and secured at the skin entrance site within interrupted suture. A dressing was applied. The patient tolerated the procedure well without immediate postprocedural complication FINDINGS: Sonographic evaluation of the liver demonstrates moderate dilatation of the left intrahepatic biliary tree as was demonstrated on preceding abdominal CT. Under direct ultrasound guidance, a dilated peripheral duct within the left lobe of the liver was opacified and ultimately cannulated, allowing placement of a 10 [place] biliary drainage catheter with end ultimately coiled and locked within the duodenum and radiopaque side marker located proximal to the level of the biliary hilum. Limited contrast injection demonstrates mild dilatation of the left intrahepatic biliary tree with apparent malignant narrowing at the level of the hilum. There is no definitive opacification of the right intrahepatic biliary tree. IMPRESSION: Successful placement of a left-sided approach 10.2 French percutaneous biliary drainage catheter with end coiled and locked within the duodenum. PLAN: - Recommend maintaining the biliary drainage catheter to external drainage. - Recommend obtaining daily CMPs.  Once the patient's bilirubin reaches a nadir, the patient may return for repeat cholangiogram as indicated as there was no definitive opacification of the right intrahepatic biliary tree on today's postprocedural cholangiogram. Electronically Signed   By: Simonne Come M.D.   On: 10/03/2022 17:24   CT ABDOMEN PELVIS W CONTRAST  Result Date: 10/03/2022 CLINICAL DATA:  57 year old with metastatic colon cancer to the liver with jaundice and elevated bilirubin. Evaluate anatomy prior to potential biliary drain placement. EXAM: CT ABDOMEN AND PELVIS WITH CONTRAST TECHNIQUE: Multidetector CT imaging of the abdomen and pelvis was performed using the standard protocol following bolus administration of  intravenous contrast. RADIATION DOSE REDUCTION: This exam was performed according to the departmental dose-optimization program which includes automated exposure control, adjustment of the mA and/or kV according to patient size and/or use of iterative reconstruction technique. CONTRAST:  OMNIPAQUE IOHEXOL 300 MG/ML  SOLN COMPARISON:  09/04/2022 and MRI 10/01/2022 and PET-CT 10/30/2021 FINDINGS: Lower chest: Small amount of volume loss at the right lung base. Trace right pleural fluid/pleural thickening. Small subtle nodules in the visualized lungs are suspicious. Many of the small nodules were not present on the previous PET-CT from 2023. 3 mm nodule in the left lower lobe on image 3/5 has slightly enlarged since 09/04/2022. Incomplete visualization of 7 mm nodule near the right minor fissure on image 1/5. Hepatobiliary: Again noted is a large lobulated mass in the posterior right hepatic lobe and near the dome. This mass or conglomeration of lesions measures greater than 10 cm on image 16/3 and minimally changed from the recent MRI and CT. There are cysts along the inferior right hepatic lobe. No significant hepatic lesions in the left hepatic lobe. Moderate to severe intrahepatic biliary dilatation without extrahepatic biliary dilatation. Intrahepatic biliary dilatation is most prominent in the left hepatic lobe. Abnormal soft tissue in the porta hepatis which could represent lymphadenopathy or tumor. Gallbladder contains stones and has diffuse wall thickening. Large lymph node at the porta hepatis on image 27/3 measures 2.4 x 1.9 cm and minimally changed since 09/04/2022 when it measured 2.1 x 1.7 cm. Concern for lymphadenopathy or abnormal soft tissue near the base of the gallbladder on image 29/3. Pancreas: Unremarkable. No pancreatic ductal dilatation or surrounding inflammatory changes. Spleen: Stable 1.5 cm low-density structure in the superior aspect of the spleen. Spleen is prominent for size  measuring 13 cm in the AP dimension. Adrenals/Urinary Tract: Normal appearance of the adrenal glands. No hydronephrosis. No suspicious renal lesions. Small low-density structures in the kidneys are too small to definitively characterize and do not require dedicated follow-up. Normal appearance of the urinary bladder. Stomach/Bowel: Normal appearance of the stomach. Patient has a loop ileostomy in the right anterior abdomen. Dilated gas-filled loop of bowel associated with the loop ileostomy and there are some dilated loops of bowel just proximal to the ileostomy. Abnormal appearance of the cecum and right colon and best seen on image 59/3. This is most compatible with patient's known primary colon cancer. Again noted are enlarged lymph nodes in the ileocolic mesentery including 1.0 cm lymph node image 56/3. Vascular/Lymphatic: Normal caliber of the abdominal aorta without aneurysm. Again noted is an enlarged lymph node between the aorta and inferior vena cava image 46/3 that measures 1.3 cm in the short axis. Enlarged lymph nodes in the precaval region extending into the porta hepatis. Reproductive: Uterus and bilateral adnexa are unremarkable. Other: Negative for free fluid.  Negative for free air. Musculoskeletal: Again noted is a peripherally sclerotic  lesion along the right side of the L1 vertebral body and right pedicle. There is a pathologic compression fracture associated with this lesion and similar to the exam on 09/04/2022. IMPRESSION: 1. Moderate to severe intrahepatic biliary dilatation without extrahepatic biliary dilatation. Transition point is at the central aspect of the liver near the porta hepatis. Findings are compatible with a malignant obstruction. Abnormal soft tissue in the porta hepatis at the base of the gallbladder related to neoplasm and/or lymphadenopathy. 2. Minimal change in the large mass involving the superior aspect of the right hepatic lobe. 3. Suspicious tiny nodules at the  visualized lung bases. Some of these nodules appear to be new since the PET-CT from 2023 and some of these small nodules may have enlarged within the past month. These small nodules could be infectious or inflammatory but metastatic disease cannot be excluded. Recommend further characterization and surveillance with chest CT. 4. Dilatation of small bowel loops proximal to the ileostomy. Findings are nonspecific but a partial obstruction cannot be excluded. 5. Minimal change in the metastatic lymphadenopathy in the abdomen. 6. Stable appearance of the pathologic compression fracture at L1. 7. Cholelithiasis with diffuse gallbladder wall thickening. Gallbladder wall thickening could be associated with the disease at the porta hepatis. Electronically Signed   By: Richarda Overlie M.D.   On: 10/03/2022 17:17   MR 3D Recon At Scanner  Result Date: 10/02/2022 CLINICAL DATA:  Evaluate bile duct dilatation. Metastatic colon cancer EXAM: MRI ABDOMEN WITHOUT AND WITH CONTRAST (INCLUDING MRCP) TECHNIQUE: Multiplanar multisequence MR imaging of the abdomen was performed both before and after the administration of intravenous contrast. Heavily T2-weighted images of the biliary and pancreatic ducts were obtained, and three-dimensional MRCP images were rendered by post processing. CONTRAST:  7mL GADAVIST GADOBUTROL 1 MMOL/ML IV SOLN COMPARISON:  09/04/2022 FINDINGS: Lower chest: No acute findings. Hepatobiliary: Liver metastases are again noted. Dominant mass within the posterior right hepatic lobe measures 9.4 by 9.7 by 9.5 cm (volume = 450 cm^3), image 14/19. On the previous exam this measured 10.3 x 10.2 by 8.7 cm (volume = 480 cm^3). Lesion in the lateral segment of left hepatic lobe measures 0.8 cm, image 21/19. Unchanged from previous exam. Cyst within the inferior right lobe of liver unchanged measuring 2.1 cm, image 25/4. Multiple stones are identified within the gallbladder. The largest stone is in the gallbladder neck  measuring 1.7 cm. The gallbladder wall appears diffusely edematous measuring 7 mm, image 11/3. There is mild dilatation of the intrahepatic bile ducts to the right lobe of liver, increased from previous exam. New moderate to severe dilatation of the intrahepatic bile ducts to the left lobe of liver. Dilated bile ducts converge within the central liver. Suspect underlying malignant stricture secondary to infiltrative tumor. The common bile duct is nondilated measuring up to 4 mm. No convincing evidence for choledocholithiasis. Pancreas: No mass, inflammatory changes, or other parenchymal abnormality identified. Spleen: Stable 1.5 cm splenic lesion which is favored to represent a benign abnormality. Adrenals/Urinary Tract: Normal adrenal glands. Small cyst within inferior pole of left kidney is unchanged. No follow-up imaging recommended. No hydronephrosis. Stomach/Bowel: Stomach appears normal. Right abdominal loop ileostomy. No dilated bowel loops identified. Vascular/Lymphatic: Normal caliber of the abdominal aorta. Portal vein, portal venous confluence and SMV remain patent. Index upper abdominal adenopathy is again seen. Enlarged portacaval lymph node measures 1.6 cm short axis, image 16/5. Unchanged from previous exam. Aortocaval lymph node measures 1.2 cm, image 30/5. Previously 1.3 cm. Other:  No significant free  fluid or fluid collections. Musculoskeletal: Metastatic lesion involving the L1 vertebra with mild superior endplate compression deformity is unchanged. IMPRESSION: 1. There is mild dilatation of the intrahepatic bile ducts to the right lobe of liver, increased from previous exam. New moderate to severe dilatation of the intrahepatic bile ducts to the left lobe of liver. Dilated bile ducts converge within the central liver. Suspect underlying malignant stricture secondary to the known large infiltrative tumor. The common bile duct is nondilated measuring up to 4 mm. No convincing evidence for  choledocholithiasis. 2. Liver metastases are again noted. The dominant mass within the posterior right hepatic lobe is stable in size from previous exam. 3. Index upper abdominal adenopathy is unchanged. 4. Metastatic lesion involving the L1 vertebra with mild superior endplate compression deformity is unchanged. Electronically Signed   By: Signa Kell M.D.   On: 10/02/2022 05:38   MR ABDOMEN MRCP W WO CONTAST  Result Date: 10/02/2022 CLINICAL DATA:  Evaluate bile duct dilatation. Metastatic colon cancer EXAM: MRI ABDOMEN WITHOUT AND WITH CONTRAST (INCLUDING MRCP) TECHNIQUE: Multiplanar multisequence MR imaging of the abdomen was performed both before and after the administration of intravenous contrast. Heavily T2-weighted images of the biliary and pancreatic ducts were obtained, and three-dimensional MRCP images were rendered by post processing. CONTRAST:  7mL GADAVIST GADOBUTROL 1 MMOL/ML IV SOLN COMPARISON:  09/04/2022 FINDINGS: Lower chest: No acute findings. Hepatobiliary: Liver metastases are again noted. Dominant mass within the posterior right hepatic lobe measures 9.4 by 9.7 by 9.5 cm (volume = 450 cm^3), image 14/19. On the previous exam this measured 10.3 x 10.2 by 8.7 cm (volume = 480 cm^3). Lesion in the lateral segment of left hepatic lobe measures 0.8 cm, image 21/19. Unchanged from previous exam. Cyst within the inferior right lobe of liver unchanged measuring 2.1 cm, image 25/4. Multiple stones are identified within the gallbladder. The largest stone is in the gallbladder neck measuring 1.7 cm. The gallbladder wall appears diffusely edematous measuring 7 mm, image 11/3. There is mild dilatation of the intrahepatic bile ducts to the right lobe of liver, increased from previous exam. New moderate to severe dilatation of the intrahepatic bile ducts to the left lobe of liver. Dilated bile ducts converge within the central liver. Suspect underlying malignant stricture secondary to infiltrative  tumor. The common bile duct is nondilated measuring up to 4 mm. No convincing evidence for choledocholithiasis. Pancreas: No mass, inflammatory changes, or other parenchymal abnormality identified. Spleen: Stable 1.5 cm splenic lesion which is favored to represent a benign abnormality. Adrenals/Urinary Tract: Normal adrenal glands. Small cyst within inferior pole of left kidney is unchanged. No follow-up imaging recommended. No hydronephrosis. Stomach/Bowel: Stomach appears normal. Right abdominal loop ileostomy. No dilated bowel loops identified. Vascular/Lymphatic: Normal caliber of the abdominal aorta. Portal vein, portal venous confluence and SMV remain patent. Index upper abdominal adenopathy is again seen. Enlarged portacaval lymph node measures 1.6 cm short axis, image 16/5. Unchanged from previous exam. Aortocaval lymph node measures 1.2 cm, image 30/5. Previously 1.3 cm. Other:  No significant free fluid or fluid collections. Musculoskeletal: Metastatic lesion involving the L1 vertebra with mild superior endplate compression deformity is unchanged. IMPRESSION: 1. There is mild dilatation of the intrahepatic bile ducts to the right lobe of liver, increased from previous exam. New moderate to severe dilatation of the intrahepatic bile ducts to the left lobe of liver. Dilated bile ducts converge within the central liver. Suspect underlying malignant stricture secondary to the known large infiltrative tumor. The common bile  duct is nondilated measuring up to 4 mm. No convincing evidence for choledocholithiasis. 2. Liver metastases are again noted. The dominant mass within the posterior right hepatic lobe is stable in size from previous exam. 3. Index upper abdominal adenopathy is unchanged. 4. Metastatic lesion involving the L1 vertebra with mild superior endplate compression deformity is unchanged. Electronically Signed   By: Signa Kell M.D.   On: 10/02/2022 05:38   US Abdomen Limited RUQ  (LIVER/GB)  Result Date: 09/11/2022 CLINICAL DATA:  Nausea vomiting and back pain EXAM: ULTRASOUND ABDOMEN LIMITED RIGHT UPPER QUADRANT COMPARISON:  CT abdomen and pelvis dated September 04, 2022 FINDINGS: Gallbladder: Gallbladder wall thickening. Gallstones, largest measures 2.1 cm. No sonographic Murphy sign noted by sonographer. Common bile duct: Diameter: 4 mm Intrahepatic biliary ductal dilation. Liver: Mass of the right lobe of the liver measuring 9.5 x 13.1 x 11.8 cm. Increased parenchymal echogenicity. Portal vein is patent on color Doppler imaging with normal direction of blood flow towards the liver. Other: None. IMPRESSION: 1. Cholelithiasis with gallbladder wall thickening, findings can be seen in the setting of acute cholecystitis although sonographic Murphy sign is negative. Recommend clinical correlation. 2. Large mass of the right lobe of the liver. 3. Intrahepatic biliary ductal dilation, likely secondary to compression from large hepatic mass. 4. Hepatic steatosis. Electronically Signed   By: Allegra Lai M.D.   On: 09/11/2022 16:23    Microbiology: Results for orders placed or performed during the hospital encounter of 10/02/22  Body fluid culture w Gram Stain     Status: None (Preliminary result)   Collection Time: 10/04/22  4:58 PM   Specimen: BILE; Body Fluid  Result Value Ref Range Status   Specimen Description   Final    BILE Performed at [facility], [address] [place], [place] [address]    Special Requests   Final    Normal Performed at [facility], [address], [place], [place] [address]    Gram Stain NO WBC SEEN ABUNDANT GRAM POSITIVE COCCI   Final   Culture   Final    FEW STREPTOCOCCUS VESTIBULARIS FEW YEAST CULTURE REINCUBATED FOR BETTER GROWTH Performed at [facility], [address], [place], [place] [address]    Report Status PENDING  Incomplete   Organism ID, Bacteria STREPTOCOCCUS VESTIBULARIS  Final       Susceptibility   Streptococcus vestibularis - MIC*    PENICILLIN <=0.06 SENSITIVE Sensitive     CEFTRIAXONE <=0.12 SENSITIVE Sensitive     LEVOFLOXACIN 2 SENSITIVE Sensitive     VANCOMYCIN <=0.12 SENSITIVE Sensitive     * FEW STREPTOCOCCUS VESTIBULARIS    Labs: CBC: Recent Labs  Lab 10/02/22 1402 10/03/22 0348 10/04/22 0329 10/05/22 0356  WBC 3.8* 3.6* 4.3 5.7  NEUTROABS  --   --   --  4.5  HGB 9.7* 10.1* 10.3* 10.3*  HCT 31.4* 32.7* 33.3* 32.8*  MCV 94.9 95.9 95.7 94.5  PLT 158 163 175 172   Basic Metabolic Panel: Recent Labs  Lab 10/02/22 1636 10/03/22 0348 10/04/22 0329 10/05/22 0356 10/06/22 0216 10/07/22 1150 10/08/22 0234  NA  --    < > 136 136 139 133* 132*  K  --    < > 4.1 3.8 3.1* 2.9* 3.8  CL  --    < > 106 106 106 103 102  CO2  --    < > 22 23 22  20* 21*  GLUCOSE  --    < >  108* 101* 124* 142* 111*  BUN  --    < > <5* 5* 8 7 <5*  CREATININE  --    < > <0.30* 0.36* 0.45 0.39* 0.31*  CALCIUM  --    < > 8.0* 8.1* 8.3* 7.6* 8.0*  MG 1.6*  --   --   --   --   --  2.1   < > = values in this interval not displayed.   Liver Function Tests: Recent Labs  Lab 10/04/22 0329 10/05/22 0356 10/06/22 0216 10/07/22 1150 10/08/22 0234  AST 105* 71* 50* 40 39  ALT 56* 45* 37 30 26  ALKPHOS 706* 595* 522* 438* 407*  BILITOT 8.0* 5.6* 5.0* 4.2* 3.7*  PROT 6.3* 6.4* 6.8 6.7 6.6  ALBUMIN 2.4* 2.4* 2.6* 2.4* 2.4*   CBG: No results for input(s): "GLUCAP" in the last 168 hours.  Discharge time spent: less than 30 minutes.  Signed: Hollice Espy, MD [facility] 10/08/2022

## 2022-10-08 NOTE — Progress Notes (Signed)
Referring Physician(s): Ennever,P  Supervising Physician: Roanna Banning  Patient Status:  [facility] - In-pt  Chief Complaint:  metastatic colon cancer/biliary obstruction    Subjective: Pt awaiting dc home today; no new c/o   Allergies: Augmentin [amoxicillin-pot clavulanate], Irinotecan, Metronidazole, Vancomycin, Latex, Tape, and Wound dressing adhesive  Medications: Prior to Admission medications   Medication Sig Start Date End Date Taking? Authorizing Provider  baclofen (LIORESAL) 10 MG tablet Take 1 tablet (10 mg total) by mouth 3 (three) times daily. 08/28/22  Yes Ennever, Rose Phi, MD  doxycycline (VIBRA-TABS) 100 MG tablet Take 1 tablet (100 mg total) by mouth 2 (two) times daily. 09/25/22  Yes Josph Macho, MD  famotidine (PEPCID) 40 MG tablet Take 1 tablet (40 mg total) by mouth 2 (two) times daily. 08/07/22  Yes Ennever, Rose Phi, MD  LORazepam (ATIVAN) 0.5 MG tablet Place 1 tablet (0.5 mg total) under the tongue every 4 (four) hours as needed for anxiety. 09/21/22  Yes Josph Macho, MD  megestrol (MEGACE) 400 MG/10ML suspension Take 10 mLs (400 mg total) by mouth 2 (two) times daily. 09/21/22  Yes Josph Macho, MD  meloxicam (MOBIC) 15 MG tablet Take 1 tablet (15 mg total) by mouth daily. 09/24/22  Yes Pickenpack-Cousar, Arty Baumgartner, NP  OLANZapine (ZYPREXA) 10 MG tablet Take 1 tablet (10 mg total) by mouth at bedtime. 09/21/22  Yes Josph Macho, MD  scopolamine (TRANSDERM-SCOP) 1 MG/3DAYS Place one patch behind the ear every three days. 09/10/22  Yes Ennever, Rose Phi, MD  fruquintinib (FRUZAQLA) 1 MG capsule Take 3 capsules (3 mg total) by mouth daily. Take for 21 days on, then off for 7 days. Repeat every 28 days. 09/25/22   Josph Macho, MD  HYDROmorphone (DILAUDID) 4 MG tablet Take 1 tablet (4 mg total) by mouth every 2 (two) hours as needed for severe pain. 10/08/22   Hollice Espy, MD  morphine (MS CONTIN) 30 MG 12 hr tablet Take 1 tablet (30 mg total) by  mouth every 8 (eight) hours. 10/08/22   Hollice Espy, MD  ondansetron (ZOFRAN) 4 MG tablet Take 1 tablet (4 mg total) by mouth every 6 (six) hours. 10/08/22   Hollice Espy, MD     Vital Signs: BP 136/87 (BP Location: Right Arm)   Pulse 90   Temp 97.7 F (36.5 C)   Resp 16   Ht 5\' 1"  (1.549 m)   Wt 166 lb 8 oz (75.5 kg)   SpO2 97%   BMI 31.46 kg/m   Physical Exam awake/alert; left biliary drain intact, dressing dry, OP about 1 liter green bile; drain flushes ok; insertion site with mild tenderness  Imaging: No results found.  Labs:  CBC: Recent Labs    10/02/22 1402 10/03/22 0348 10/04/22 0329 10/05/22 0356  WBC 3.8* 3.6* 4.3 5.7  HGB 9.7* 10.1* 10.3* 10.3*  HCT 31.4* 32.7* 33.3* 32.8*  PLT 158 163 175 172    COAGS: Recent Labs    03/15/22 0801 03/29/22 0820 10/02/22 1402  INR 1.0 1.0 1.2    BMP: Recent Labs    10/05/22 0356 10/06/22 0216 10/07/22 1150 10/08/22 0234  NA 136 139 133* 132*  K 3.8 3.1* 2.9* 3.8  CL 106 106 103 102  CO2 23 22 20* 21*  GLUCOSE 101* 124* 142* 111*  BUN 5* 8 7 <5*  CALCIUM 8.1* 8.3* 7.6* 8.0*  CREATININE 0.36* 0.45 0.39* 0.31*  GFRNONAA >60 >60 >60 >60  LIVER FUNCTION TESTS: Recent Labs    10/05/22 0356 10/06/22 0216 10/07/22 1150 10/08/22 0234  BILITOT 5.6* 5.0* 4.2* 3.7*  AST 71* 50* 40 39  ALT 45* 37 30 26  ALKPHOS 595* 522* 438* 407*  PROT 6.4* 6.8 6.7 6.6  ALBUMIN 2.4* 2.6* 2.4* 2.4*    Assessment and Plan: 57 year old female with history of metastatic colon cancer to liver and L1 vertebral body. She is s/p port-A-Cath placement in 2022 and right hepatic Y90 radioembolization on 03/29/2022 . Status post laparoscopic loop ileostomy in March of this year; now with biliary obstruction/hyperbilirubinemia, s/p left I/E biliary drain 5/8; afebrile, creat 0.31, t bili 3.7(4.2), bile cx- strept, yeast; plan to cap biliary drain today and send extra drain bag with pt in case she develops increasing abd  pain , fever, leaking at drain insertion site , fever- if this occurs she was told to reattach drain to bag; radiology contact phone numbers given to pt; rec once daily flush of drain with 5 cc sterile saline once daily as outpatient ,dressing change every 2-3 days; f/u cholangiogram in 4-6 weeks   Electronically Signed: D. Jeananne Rama, PA-C 10/08/2022, 3:06 PM   I spent a total of 15 Minutes at the the patient's bedside AND on the patient's hospital floor or unit, greater than 50% of which was counseling/coordinating care for biliary drain    Patient ID: Rebecca Bullock, female   DOB: [birth year], 57 y.o.   MRN: [id number]

## 2022-10-09 ENCOUNTER — Encounter: Payer: Self-pay | Admitting: Family

## 2022-10-09 ENCOUNTER — Telehealth: Payer: Self-pay

## 2022-10-09 ENCOUNTER — Other Ambulatory Visit: Payer: Self-pay

## 2022-10-09 ENCOUNTER — Other Ambulatory Visit (HOSPITAL_BASED_OUTPATIENT_CLINIC_OR_DEPARTMENT_OTHER): Payer: Self-pay

## 2022-10-09 NOTE — Transitions of Care (Post Inpatient/ED Visit) (Signed)
10/09/2022  Name: Rebecca Bullock MRN: [id number] DOB: 1965-06-30  Today's TOC FU Call Status: Today's TOC FU Call Status:: Successful TOC FU Call Competed TOC FU Call Complete Date: 10/09/22  Transition Care Management Follow-up Telephone Call Date of Discharge: 10/08/22 Discharge Facility: [facility]) Type of Discharge: Inpatient Admission Primary Inpatient Discharge Diagnosis:: obstruction of bile duct How have you been since you were released from the hospital?: Better Any questions or concerns?: No  Items Reviewed: Did you receive and understand the discharge instructions provided?: Yes Medications obtained,verified, and reconciled?: Yes (Medications Reviewed) Any new allergies since your discharge?: No Dietary orders reviewed?: Yes Do you have support at home?: Yes People in Home: significant other  Medications Reviewed Today: Medications Reviewed Today     Reviewed by Karena Addison, LPN (Licensed Practical Nurse) on 10/09/22 at 1036  Med List Status: <None>   Medication Order Taking? Sig Documenting Provider Last Dose Status Informant  baclofen (LIORESAL) 10 MG tablet [id number] Yes Take 1 tablet (10 mg total) by mouth 3 (three) times daily. Josph Macho, MD Taking Active Self, Child  famotidine (PEPCID) 40 MG tablet [id number] Yes Take 1 tablet (40 mg total) by mouth 2 (two) times daily. Josph Macho, MD Taking Active Self, Child  fruquintinib (FRUZAQLA) 1 MG capsule [id number] No Take 3 capsules (3 mg total) by mouth daily. Take for 21 days on, then off for 7 days. Repeat every 28 days.  Patient not taking: Reported on 10/09/2022   Josph Macho, MD Not Taking Active Self, Child           Med Note (CARD, AMY L   Tue Oct 02, 2022  3:14 PM) Patient currently not taking due to illness.  HYDROmorphone (DILAUDID) 4 MG tablet [id number] Yes Take 1 tablet (4 mg total) by mouth every 2 (two) hours as needed for severe pain. Hollice Espy, MD Taking Active    LORazepam (ATIVAN) 0.5 MG tablet [id number] Yes Place 1 tablet (0.5 mg total) under the tongue every 4 (four) hours as needed for anxiety. Josph Macho, MD Taking Active Self, Child           Med Note Lacie Draft   Tue Sep 25, 2022 10:51 AM) 09/25/2022 Per Palliative Care. Take one tab if vomits more than 4 times.  megestrol (MEGACE) 400 MG/10ML suspension [id number] Yes Take 10 mLs (400 mg total) by mouth 2 (two) times daily. Josph Macho, MD Taking Active Self, Child  meloxicam (MOBIC) 15 MG tablet [id number] Yes Take 1 tablet (15 mg total) by mouth daily. Pickenpack-Cousar, Arty Baumgartner, NP Taking Active Self, Child  morphine (MS CONTIN) 30 MG 12 hr tablet [id number] Yes Take 1 tablet (30 mg total) by mouth every 8 (eight) hours. Hollice Espy, MD Taking Active   OLANZapine (ZYPREXA) 10 MG tablet [id number] Yes Take 1 tablet (10 mg total) by mouth at bedtime. Josph Macho, MD Taking Active Self, Child  ondansetron (ZOFRAN) 4 MG tablet [id number] Yes Take 1 tablet (4 mg total) by mouth every 6 (six) hours. Hollice Espy, MD Taking Active   scopolamine (TRANSDERM-SCOP) 1 MG/3DAYS [id number] Yes Place one patch behind the ear every three days. Josph Macho, MD Taking Active Self, Child  Med List Note Otis Peak, [facility] 09/04/22 1323): Kelton Pillar filled through [facility]            Home Care and Equipment/Supplies: Were Home Health Services Ordered?: NA Any new equipment or medical supplies  ordered?: NA  Functional Questionnaire: Do you need assistance with bathing/showering or dressing?: No Do you need assistance with meal preparation?: No Do you need assistance with eating?: No Do you have difficulty maintaining continence: No Do you need assistance with getting out of bed/getting out of a chair/moving?: No Do you have difficulty managing or taking your medications?: No  Follow up appointments reviewed: PCP Follow-up appointment confirmed?: NA Specialist Hospital  Follow-up appointment confirmed?: Yes Date of Specialist follow-up appointment?: 10/12/22 Follow-Up Specialty Provider:: onco Do you need transportation to your follow-up appointment?: No Do you understand care options if your condition(s) worsen?: Yes-patient verbalized understanding    SIGNATURE Karena Addison, LPN [facility] Nurse Health Advisor Direct Dial [phone]

## 2022-10-10 ENCOUNTER — Other Ambulatory Visit (HOSPITAL_BASED_OUTPATIENT_CLINIC_OR_DEPARTMENT_OTHER): Payer: Self-pay

## 2022-10-10 ENCOUNTER — Encounter: Payer: Self-pay | Admitting: Family

## 2022-10-10 NOTE — Progress Notes (Unsigned)
Palliative Medicine [facility]  Telephone:([phone]) [phone] Fax:([phone]) [phone]   Name: Rebecca Bullock Date: 10/10/2022 MRN: [id number]  DOB: [birth year]  Patient Care Team: Nolene Ebbs as PCP - General (Family Medicine) Josph Macho, MD as Medical Oncologist (Oncology) Pickenpack-Cousar, Arty Baumgartner, NP as Nurse Practitioner (Nurse Practitioner)   INTERVAL HISTORY: Rebecca Bullock is a 57 y.o. female with oncologic medical history including colon cancer (04/2021) with metastatic disease to liver and bone. Patient underwent a diverting loop ileostomy in march 2024. Palliative ask to see for symptom and pain management and goals of care   SOCIAL HISTORY:     reports that she has never smoked. She has never used smokeless tobacco. She reports that she does not currently use alcohol. She reports that she does not currently use drugs.  ADVANCE DIRECTIVES:  None on file  CODE STATUS: Full code  PAST MEDICAL HISTORY: Past Medical History:  Diagnosis Date   Colon cancer metastasized to liver (HCC) 05/05/2021   Family history of lung cancer    Family history of pancreatic cancer    Goals of care, counseling/discussion 05/05/2021   History of radiation therapy    Lumbar Spine- 07/12/22-07/25/22- Dr. Antony Blackbird    ALLERGIES:  is allergic to augmentin [amoxicillin-pot clavulanate], irinotecan, metronidazole, vancomycin, latex, tape, and wound dressing adhesive.  MEDICATIONS:  Current Outpatient Medications  Medication Sig Dispense Refill   baclofen (LIORESAL) 10 MG tablet Take 1 tablet (10 mg total) by mouth 3 (three) times daily. 60 each 2   famotidine (PEPCID) 40 MG tablet Take 1 tablet (40 mg total) by mouth 2 (two) times daily. 60 tablet 4   fruquintinib (FRUZAQLA) 1 MG capsule Take 3 capsules (3 mg total) by mouth daily. Take for 21 days on, then off for 7 days. Repeat every 28 days. (Patient not taking: Reported on 10/09/2022) 63 capsule 4   HYDROmorphone  (DILAUDID) 4 MG tablet Take 1 tablet (4 mg total) by mouth every 2 (two) hours as needed for severe pain. 160 tablet 0   LORazepam (ATIVAN) 0.5 MG tablet Place 1 tablet (0.5 mg total) under the tongue every 4 (four) hours as needed for anxiety. 60 tablet 0   megestrol (MEGACE) 400 MG/10ML suspension Take 10 mLs (400 mg total) by mouth 2 (two) times daily. 960 mL 2   meloxicam (MOBIC) 15 MG tablet Take 1 tablet (15 mg total) by mouth daily. 30 tablet 0   morphine (MS CONTIN) 30 MG 12 hr tablet Take 1 tablet (30 mg total) by mouth every 8 (eight) hours. 60 tablet 0   OLANZapine (ZYPREXA) 10 MG tablet Take 1 tablet (10 mg total) by mouth at bedtime. 30 tablet 3   ondansetron (ZOFRAN) 4 MG tablet Take 1 tablet (4 mg total) by mouth every 6 (six) hours. 30 tablet 3   scopolamine (TRANSDERM-SCOP) 1 MG/3DAYS Place one patch behind the ear every three days. 10 patch 3   No current facility-administered medications for this visit.   Facility-Administered Medications Ordered in Other Visits  Medication Dose Route Frequency Provider Last Rate Last Admin   atropine 1 MG/ML injection            heparin lock flush 100 unit/mL  500 Units Intravenous Once Ennever, Rose Phi, MD       HYDROmorphone (DILAUDID) 4 MG/ML injection            ketorolac (TORADOL) 15 MG/ML injection  LORazepam (ATIVAN) 2 MG/ML injection            sodium chloride flush (NS) 0.9 % injection 10 mL  10 mL Intracatheter Once PRN Erenest Blank, NP       sodium chloride flush (NS) 0.9 % injection 10 mL  10 mL Intravenous PRN Josph Macho, MD   10 mL at 10/31/21 1258    VITAL SIGNS: There were no vitals taken for this visit. There were no vitals filed for this visit.  Estimated body mass index is 31.46 kg/m as calculated from the following:   Height as of 10/02/22: 5\' 1"  (1.549 m).   Weight as of 10/06/22: 166 lb 8 oz (75.5 kg).   PERFORMANCE STATUS (ECOG) : 1 - Symptomatic but completely ambulatory  IMPRESSION:      1.   Pain Rebecca Bullock reports her pain is well controlled. She is tolerating regimen and drowsiness has improved. We discussed her regimen at length. Is not having to take the hydromorphone as often.    2.  Nausea/vomiting.  Controlled. States she has not thrown up in over 3 days.   3.  Decreased appetite Appetite is improving.   4.  Goals of Care   4/29- Rebecca Bullock has three adult children. She lives with her daughter Rebecca Bullock who works during the day and is at home during the night. Rebecca Bullock and boyfriend Rebecca Bullock check on patient intermittently throughout the day. While patient does not have documents in place as of yet, she requests that these individuals be involved in her care.   Rebecca Bullock relates not being ready to make a decision about her code status at this time. She is, however, open to education and expresses wanting to get this completed in the near future.   Rebecca Bullock current goal is to achieve better pain management, while maintaining as much alertness and physical function as possible.  We discussed Her current illness and what it means in the larger context of Her on-going co-morbidities. Natural disease trajectory and expectations were discussed.  I discussed the importance of continued conversation with family and their medical providers regarding overall plan of care and treatment options, ensuring decisions are within the context of the patients values and GOCs.  PLAN: Ongoing symptom management support. Overall patient is doing much better. No changes to current regimen.  For pain:  Continue taking MS Contin 30 mg every 12 hours. Change Dilaudid to 4 mg every 4 hours as needed for severe pain. Change Baclofen from scheduled dosing to as-needed dosing. Instructed to take twice daily as needed for muscle spasms. Start Mobic daily. For nausea/vomiting/appetite: Pick-up Megace today from pharmacy and begin. Continue taking Pepcid for dyspepsia. Continue  Scopolamine patch. Continue Zyprexa at bedtime. Zofran and Compazine available as needed. Patient currently taking Ativan around-the-clock twice a day. Instructed to take Ativan only as needed if having more than 4 nausea/vomiting episodes in a day; no more than twice in a day. Education provided on MS Contin, Dilaudid, Baclofen, Zyprexa, and Ativan and their sedating effects. Instructed NOT to take these medications at the same time due to concerns for over-sedation and respiratory depression. Should take at minimum 2 hours apart if needed. Pain contract on file.   Follow-up visit scheduled for Thursday 5/9. Patient aware to call sooner for needs, questions, concerns.  Patient expressed understanding and was in agreement with this plan. She also understands that She can call the clinic at any time with any questions, concerns, or  complaints.   Any controlled substances utilized were prescribed in the context of palliative care. PDMP has been reviewed.    Visit consisted of counseling and education dealing with the complex and emotionally intense issues of symptom management and palliative care in the setting of serious and potentially life-threatening illness.Greater than 50%  of this time was spent counseling and coordinating care related to the above assessment and plan.  Willette Alma, AGPCNP-BC  Palliative Medicine Team/[facility]  *Please note that this is a verbal dictation therefore any spelling or grammatical errors are due to the "[employer]agon [employer] One" system interpretation.

## 2022-10-11 ENCOUNTER — Other Ambulatory Visit: Payer: Self-pay

## 2022-10-11 ENCOUNTER — Inpatient Hospital Stay (HOSPITAL_BASED_OUTPATIENT_CLINIC_OR_DEPARTMENT_OTHER): Payer: Medicaid Other | Admitting: Nurse Practitioner

## 2022-10-11 ENCOUNTER — Telehealth (HOSPITAL_COMMUNITY): Payer: Self-pay | Admitting: Radiology

## 2022-10-11 ENCOUNTER — Encounter: Payer: Self-pay | Admitting: Nurse Practitioner

## 2022-10-11 VITALS — BP 120/71 | HR 112 | Temp 97.7°F | Resp 18 | Ht 61.0 in | Wt 159.4 lb

## 2022-10-11 DIAGNOSIS — C189 Malignant neoplasm of colon, unspecified: Secondary | ICD-10-CM

## 2022-10-11 DIAGNOSIS — Z515 Encounter for palliative care: Secondary | ICD-10-CM

## 2022-10-11 DIAGNOSIS — M792 Neuralgia and neuritis, unspecified: Secondary | ICD-10-CM | POA: Diagnosis not present

## 2022-10-11 DIAGNOSIS — Z7189 Other specified counseling: Secondary | ICD-10-CM | POA: Diagnosis not present

## 2022-10-11 DIAGNOSIS — G893 Neoplasm related pain (acute) (chronic): Secondary | ICD-10-CM

## 2022-10-11 DIAGNOSIS — C787 Secondary malignant neoplasm of liver and intrahepatic bile duct: Secondary | ICD-10-CM

## 2022-10-11 DIAGNOSIS — R530 Neoplastic (malignant) related fatigue: Secondary | ICD-10-CM

## 2022-10-11 DIAGNOSIS — R110 Nausea: Secondary | ICD-10-CM

## 2022-10-11 NOTE — Telephone Encounter (Signed)
Returned call to patient's spouse Jeannett Senior) concerning a small spot of wetness under patient's bandage. Pt had biliary drain placed on 5/8. The bag was reconnected on 5/14 and does have some drainage. Today they noticed a wet spot underneath the bandage about the size of a silver dollar. I called Dr. Milford Cage (rad on call) and discussed. Per Dr. Milford Cage if patient is stable will have a PA call her first thing tomorrow morning (pt requested around 8 am). Per pt's spouse, there is no fever, no tenderness, no odor, no pain, or any symptoms associated with this wet spot. He was advised to keep it dry and clean and should she develop any symptoms he is to call 911 or take her to the ER ASAP. He understands and agrees with this plan of care. He states she is in no distress he just was unsure of what he should do. JM

## 2022-10-12 ENCOUNTER — Encounter: Payer: Self-pay | Admitting: Hematology & Oncology

## 2022-10-12 ENCOUNTER — Inpatient Hospital Stay: Payer: Medicaid Other

## 2022-10-12 ENCOUNTER — Inpatient Hospital Stay (HOSPITAL_BASED_OUTPATIENT_CLINIC_OR_DEPARTMENT_OTHER): Payer: Medicaid Other | Admitting: Hematology & Oncology

## 2022-10-12 ENCOUNTER — Other Ambulatory Visit: Payer: Self-pay

## 2022-10-12 VITALS — BP 103/64 | HR 86

## 2022-10-12 VITALS — BP 110/74 | HR 109 | Temp 98.4°F | Resp 19 | Ht 61.0 in | Wt 157.1 lb

## 2022-10-12 DIAGNOSIS — K831 Obstruction of bile duct: Secondary | ICD-10-CM | POA: Diagnosis not present

## 2022-10-12 DIAGNOSIS — Z5112 Encounter for antineoplastic immunotherapy: Secondary | ICD-10-CM | POA: Diagnosis not present

## 2022-10-12 DIAGNOSIS — Z5111 Encounter for antineoplastic chemotherapy: Secondary | ICD-10-CM | POA: Diagnosis present

## 2022-10-12 DIAGNOSIS — C182 Malignant neoplasm of ascending colon: Secondary | ICD-10-CM | POA: Diagnosis present

## 2022-10-12 DIAGNOSIS — C189 Malignant neoplasm of colon, unspecified: Secondary | ICD-10-CM | POA: Diagnosis not present

## 2022-10-12 DIAGNOSIS — C7951 Secondary malignant neoplasm of bone: Secondary | ICD-10-CM | POA: Diagnosis not present

## 2022-10-12 DIAGNOSIS — C787 Secondary malignant neoplasm of liver and intrahepatic bile duct: Secondary | ICD-10-CM | POA: Diagnosis not present

## 2022-10-12 DIAGNOSIS — R110 Nausea: Secondary | ICD-10-CM

## 2022-10-12 DIAGNOSIS — Z452 Encounter for adjustment and management of vascular access device: Secondary | ICD-10-CM | POA: Diagnosis not present

## 2022-10-12 DIAGNOSIS — Z923 Personal history of irradiation: Secondary | ICD-10-CM | POA: Diagnosis not present

## 2022-10-12 LAB — CMP (CANCER CENTER ONLY)
ALT: 23 U/L (ref 0–44)
AST: 40 U/L (ref 15–41)
Albumin: 3.6 g/dL (ref 3.5–5.0)
Alkaline Phosphatase: 404 U/L — ABNORMAL HIGH (ref 38–126)
Anion gap: 7 (ref 5–15)
BUN: 8 mg/dL (ref 6–20)
CO2: 25 mmol/L (ref 22–32)
Calcium: 9.4 mg/dL (ref 8.9–10.3)
Chloride: 102 mmol/L (ref 98–111)
Creatinine: 0.53 mg/dL (ref 0.44–1.00)
GFR, Estimated: >60 mL/min (ref >60)
Glucose, Bld: 142 mg/dL — ABNORMAL HIGH (ref 70–99)
Potassium: 3.3 mmol/L — ABNORMAL LOW (ref 3.5–5.1)
Sodium: 134 mmol/L — ABNORMAL LOW (ref 135–145)
Total Bilirubin: 3.8 mg/dL (ref 0.3–1.2)
Total Protein: 7.3 g/dL (ref 6.5–8.1)

## 2022-10-12 LAB — CBC WITH DIFFERENTIAL (CANCER CENTER ONLY)
Abs Immature Granulocytes: 0.02 10*3/uL (ref 0.00–0.07)
Basophils Absolute: 0 10*3/uL (ref 0.0–0.1)
Basophils Relative: 0 %
Eosinophils Absolute: 0.1 10*3/uL (ref 0.0–0.5)
Eosinophils Relative: 3 %
HCT: 33 % — ABNORMAL LOW (ref 36.0–46.0)
Hemoglobin: 10.6 g/dL — ABNORMAL LOW (ref 12.0–15.0)
Immature Granulocytes: 0 %
Lymphocytes Relative: 11 %
Lymphs Abs: 0.5 10*3/uL — ABNORMAL LOW (ref 0.7–4.0)
MCH: 29.7 pg (ref 26.0–34.0)
MCHC: 32.1 g/dL (ref 30.0–36.0)
MCV: 92.4 fL (ref 80.0–100.0)
Monocytes Absolute: 0.6 10*3/uL (ref 0.1–1.0)
Monocytes Relative: 13 %
Neutro Abs: 3.4 10*3/uL (ref 1.7–7.7)
Neutrophils Relative %: 73 %
Platelet Count: 179 10*3/uL (ref 150–400)
RBC: 3.57 MIL/uL — ABNORMAL LOW (ref 3.87–5.11)
RDW: 16.8 % — ABNORMAL HIGH (ref 11.5–15.5)
WBC Count: 4.7 10*3/uL (ref 4.0–10.5)
nRBC: 0 % (ref 0.0–0.2)

## 2022-10-12 LAB — PREALBUMIN: Prealbumin: 9 mg/dL — ABNORMAL LOW (ref 18–38)

## 2022-10-12 LAB — CEA (IN HOUSE-CHCC): CEA (CHCC-In House): 16.3 ng/mL — ABNORMAL HIGH (ref 0.00–5.00)

## 2022-10-12 LAB — LACTATE DEHYDROGENASE: LDH: 635 U/L — ABNORMAL HIGH (ref 98–192)

## 2022-10-12 MED ORDER — HEPARIN SOD (PORK) LOCK FLUSH 100 UNIT/ML IV SOLN
500.0000 [IU] | Freq: Once | INTRAVENOUS | Status: AC
  Administered 2022-10-12: 500 [IU] via INTRAVENOUS

## 2022-10-12 MED ORDER — SODIUM CHLORIDE 0.9 % IV SOLN: Freq: Once | INTRAVENOUS | Status: AC

## 2022-10-12 MED ORDER — SODIUM CHLORIDE 0.9% FLUSH
10.0000 mL | INTRAVENOUS | Status: DC | PRN
  Administered 2022-10-12: 10 mL via INTRAVENOUS

## 2022-10-12 NOTE — Patient Instructions (Signed)

## 2022-10-12 NOTE — Progress Notes (Signed)
DISCONTINUE ON PATHWAY REGIMEN - Colorectal     A cycle is every 14 days:     Bevacizumab-xxxx      Irinotecan      Oxaliplatin      Leucovorin      Fluorouracil   **Always confirm dose/schedule in your pharmacy ordering system**  REASON: Toxicities / Adverse Event PRIOR TREATMENT: [id number]: FOLFOXIRI + Bevacizumab q14 Days TREATMENT RESPONSE: Unable to Evaluate  START ON PATHWAY REGIMEN - Colorectal     A cycle is every 14 days:     Bevacizumab-xxxx      Irinotecan      Leucovorin      Fluorouracil      Fluorouracil   **Always confirm dose/schedule in your pharmacy ordering system**  Patient Characteristics: Distant Metastases, Nonsurgical Candidate, BRAF V600 Mutation Positive (KRAS/NRAS Wild-Type), Standard Cytotoxic/Targeted Therapy, Third Line Standard Cytotoxic/Targeted Therapy Tumor Location: Colon Therapeutic Status: Distant Metastases Microsatellite/Mismatch Repair Status: Unknown BRAF Mutation Status: Mutation Positive KRAS/NRAS Mutation Status: Wild-Type (no mutation) Standard Cytotoxic/Targeted Line of Therapy: Third Line Standard Cytotoxic/Targeted Therapy Intent of Therapy: Non-Curative / Palliative Intent, Discussed with Patient

## 2022-10-12 NOTE — Patient Instructions (Signed)

## 2022-10-12 NOTE — Progress Notes (Signed)
Hematology and Oncology Follow Up Visit  Rebecca Bullock [id number] [birth year] 57 y.o. 10/12/2022   Principle Diagnosis:  Metastatic colorectal cancer-liver metastasis- BRAF (+)   Current Therapy:        Status post cycle 1 of chemotherapy with FOLFOXIRI/Avastin Encorafenib/Vectibix -- started on 06/19/2021, s/p cycle #6 -- d/c on 10/31/2021 Intrahepatic TACE -- 03/29/2022 FOLFOXIRI/Avastin-- s/p cycle  #5-- start on 02/19/2022, -oxaliplatin dropped on 04/03/2022 -- d/c on 07/03/2022 IV iron-Ferrlecit given on 05/10/2022  Lonsurf/Avastin -- s/p cycle #1 -- start on 07/07/2022 -DC on 08/15/2022 Zometa 4 mg IV every 3 months -next dose on 11/2022  XRT to L1 vertebral body --start on 07/10/2022 Fruquintinib 5 mg po q day (21/7) --  start on 04/`09/2022 --changed to 3 mg p.o. daily (21/7) on 09/25/2022               Interim History:  Rebecca Bullock is here today for follow-up.  She was hospitalized with biliary obstruction.  She had intrahepatic biliary obstruction.  She had a biliary catheter placed.  Her bilirubin came down quite nicely.  She has a colostomy.  This seems to be working pretty well.  She is not having any nausea or vomiting.  There has been a better appetite.  Her prealbumin today was up to 9.  This is encouraging.  She still is trying hard.  She still like to have treatment.  I know that our options are not all that great.  I know that we did have her on fruquintinib.  However, with her bilirubin being 3.8, we really cannot use this agent.  I think an option might be to try FOLFIRI with Vectibix.  I know she has this before is been a while since she is had this.  I think it be worthwhile trying.  Her CEA level today was 16.3.  She has had no cough or shortness of breath.  She has had no leg swelling.  She is trying to get about a little bit better.  I know she has a lot of help from her family which is really nice to see.  She has had chronic pain issues.  She has metastatic  disease to her spine.  I know that she is on hydrocodone and MS Contin.  I think that Care has been helping with this.  She has had no fever.  There is been no obvious bleeding.    Currently, I would have to say that her performance status is probably ECOG 1-2.  Medications:  Allergies as of 10/12/2022       Reactions   Augmentin [amoxicillin-pot Clavulanate] Diarrhea, Other (See Comments)   Extreme diarrhea, abdominal pain.   Irinotecan Other (See Comments)   Flushing, tingling, visual disturbance   Metronidazole Diarrhea, Other (See Comments)   Vancomycin Rash, Other (See Comments)   "Red Man Syndrome"   Latex Rash   Tape Rash   Wound Dressing Adhesive Rash        Medication List        Accurate as of Oct 12, 2022  5:12 PM. If you have any questions, ask your nurse or doctor.          baclofen 10 MG tablet Commonly known as: LIORESAL Take 1 tablet (10 mg total) by mouth 3 (three) times daily.   famotidine 40 MG tablet Commonly known as: PEPCID Take 1 tablet (40 mg total) by mouth 2 (two) times daily.   Fruzaqla 1 MG capsule Generic drug: fruquintinib Take 3  capsules (3 mg total) by mouth daily. Take for 21 days on, then off for 7 days. Repeat every 28 days.   HYDROmorphone 4 MG tablet Commonly known as: Dilaudid Take 1 tablet (4 mg total) by mouth every 2 (two) hours as needed for severe pain.   LORazepam 0.5 MG tablet Commonly known as: ATIVAN Place 1 tablet (0.5 mg total) under the tongue every 4 (four) hours as needed for anxiety.   megestrol 40 MG/ML suspension Commonly known as: MEGACE Take 10 mLs (400 mg total) by mouth 2 (two) times daily.   meloxicam 15 MG tablet Commonly known as: MOBIC Take 1 tablet (15 mg total) by mouth daily.   morphine 30 MG 12 hr tablet Commonly known as: MS CONTIN Take 1 tablet (30 mg total) by mouth every 8 (eight) hours.   OLANZapine 10 MG tablet Commonly known as: ZYPREXA Take 1 tablet (10 mg total) by mouth at  bedtime.   ondansetron 4 MG tablet Commonly known as: ZOFRAN Take 1 tablet (4 mg total) by mouth every 6 (six) hours.   Transderm-Scop 1 MG/3DAYS Generic drug: scopolamine Place one patch behind the ear every three days.        Allergies:  Allergies  Allergen Reactions   Augmentin [Amoxicillin-Pot Clavulanate] Diarrhea and Other (See Comments)    Extreme diarrhea, abdominal pain.   Irinotecan Other (See Comments)    Flushing, tingling, visual disturbance   Metronidazole Diarrhea and Other (See Comments)   Vancomycin Rash and Other (See Comments)    "Red Man Syndrome"   Latex Rash   Tape Rash   Wound Dressing Adhesive Rash    Past Medical History, Surgical history, Social history, and Family History were reviewed and updated.  Review of Systems: Review of Systems  Constitutional: Negative.   HENT: Negative.    Eyes: Negative.   Respiratory: Negative.    Cardiovascular: Negative.   Gastrointestinal: Negative.        Constipation alternating with diarrhea.   Genitourinary: Negative.   Musculoskeletal: Negative.   Skin: Negative.   Neurological: Negative.   Endo/Heme/Allergies: Negative.   Psychiatric/Behavioral: Negative.       Physical Exam: Her vital signs show temperature of 97 6.  Pulse 106.  Blood pressure 111/82.       Wt Readings from Last 3 Encounters:  10/12/22 157 lb 1.9 oz (71.3 kg)  10/11/22 159 lb 6.4 oz (72.3 kg)  10/06/22 166 lb 8 oz (75.5 kg)    Physical Exam Vitals reviewed.  Constitutional:      Comments: This is a well-developed and well-nourished white female.  She has little bit of jaundice.  HENT:     Head: Normocephalic and atraumatic.  Eyes:     General: Scleral icterus present.     Pupils: Pupils are equal, round, and reactive to light.  Cardiovascular:     Rate and Rhythm: Normal rate and regular rhythm.     Heart sounds: Normal heart sounds.  Pulmonary:     Effort: Pulmonary effort is normal.     Breath sounds: Normal  breath sounds.  Abdominal:     General: Bowel sounds are normal.     Palpations: Abdomen is soft.     Comments: Abdominal exam is soft.  She has colostomy intact.  She has a biliary drain catheter also.  There is no obvious fluid wave.  There is no obvious abdominal mass.  Her liver edge might be at the right costal margin.   Musculoskeletal:  General: No tenderness or deformity. Normal range of motion.     Cervical back: Normal range of motion.  Lymphadenopathy:     Cervical: No cervical adenopathy.  Skin:    General: Skin is warm and dry.     Findings: No erythema or rash.  Neurological:     Mental Status: She is alert and oriented to person, place, and time.  Psychiatric:        Behavior: Behavior normal.        Thought Content: Thought content normal.        Judgment: Judgment normal.      Lab Results  Component Value Date   WBC 4.7 10/12/2022   HGB 10.6 (L) 10/12/2022   HCT 33.0 (L) 10/12/2022   MCV 92.4 10/12/2022   PLT 179 10/12/2022   Lab Results  Component Value Date   FERRITIN 312 (H) 09/21/2022   IRON 58 09/21/2022   TIBC 286 09/21/2022   UIBC 228 09/21/2022   IRONPCTSAT 20 09/21/2022   Lab Results  Component Value Date   RETICCTPCT 1.6 08/07/2022   RBC 3.57 (L) 10/12/2022   No results found for: "KPAFRELGTCHN", "LAMBDASER", "KAPLAMBRATIO" No results found for: "IGGSERUM", "IGA", "IGMSERUM" No results found for: "TOTALPROTELP", "ALBUMINELP", "A1GS", "A2GS", "BETS", "BETA2SER", "GAMS", "MSPIKE", "SPEI"   Chemistry      Component Value Date/Time   NA 134 (L) 10/12/2022 0937   K 3.3 (L) 10/12/2022 0937   CL 102 10/12/2022 0937   CO2 25 10/12/2022 0937   BUN 8 10/12/2022 0937   CREATININE 0.53 10/12/2022 0937   CREATININE 0.56 03/23/2021 0000      Component Value Date/Time   CALCIUM 9.4 10/12/2022 0937   ALKPHOS 404 (H) 10/12/2022 0937   AST 40 10/12/2022 0937   ALT 23 10/12/2022 0937   BILITOT 3.8 (HH) 10/12/2022 0937       Impression  and Plan: Rebecca Bullock is a very charming  57 yo caucasian female with metastatic colon cancer.  She had disease confined to her liver.  However, she then began to have progression.  She had disease in her L1 vertebral body.  She has received radiation therapy to this.  We had her on Lonsurf and she could not tolerate this at all.  She has been hospitalized.  She then developed a bowel obstruction requiring emergency surgery for this.  She was then hospitalized again with biliary obstruction.  She had a biliary drain catheter placed.  I know this is a very tough situation.  I know her treatment options or not all that wonderful.  Again, I am would like to try to the fruquintinib but really cannot because of her bilirubin.  I am going to try her on FOLFIRI along with Vectibix.  Maybe, we can get a response with this.  I would think that the CEA level will tell us.  I will also think that the bilirubin might also show [place] if it continues to decrease.  Her bilirubin is holding steady right now.  I know her performance status is not that great but yet I think it is adequate enough for her to tolerate treatment.  I just hate that she is still having some pain issues.  We will try to get started with treatment next week.  We will try to do treatment every 2 weeks if possible.  I will like to see her back when she starts her second cycle of treatment.  We will probably go with 4 cycles and  then reassess with scans if she tolerates treatment.  It is encouraging that her prealbumin is up a little bit.  I would really like to see it above 10.   Josph Macho, MD 5/17/20245:12 PM

## 2022-10-17 ENCOUNTER — Inpatient Hospital Stay: Payer: Medicaid Other

## 2022-10-17 ENCOUNTER — Encounter: Payer: Medicaid Other | Admitting: Dietician

## 2022-10-17 ENCOUNTER — Encounter: Payer: Self-pay | Admitting: Hematology & Oncology

## 2022-10-17 ENCOUNTER — Other Ambulatory Visit: Payer: Self-pay | Admitting: Nurse Practitioner

## 2022-10-17 ENCOUNTER — Encounter: Payer: Self-pay | Admitting: Family

## 2022-10-17 ENCOUNTER — Other Ambulatory Visit (HOSPITAL_BASED_OUTPATIENT_CLINIC_OR_DEPARTMENT_OTHER): Payer: Self-pay

## 2022-10-17 VITALS — BP 109/59 | HR 81 | Resp 17

## 2022-10-17 DIAGNOSIS — C189 Malignant neoplasm of colon, unspecified: Secondary | ICD-10-CM

## 2022-10-17 DIAGNOSIS — G893 Neoplasm related pain (acute) (chronic): Secondary | ICD-10-CM

## 2022-10-17 DIAGNOSIS — Z5112 Encounter for antineoplastic immunotherapy: Secondary | ICD-10-CM | POA: Diagnosis not present

## 2022-10-17 DIAGNOSIS — Z515 Encounter for palliative care: Secondary | ICD-10-CM

## 2022-10-17 LAB — CMP (CANCER CENTER ONLY)
ALT: 20 U/L (ref 0–44)
AST: 36 U/L (ref 15–41)
Albumin: 3.6 g/dL (ref 3.5–5.0)
Alkaline Phosphatase: 299 U/L — ABNORMAL HIGH (ref 38–126)
Anion gap: 9 (ref 5–15)
BUN: 10 mg/dL (ref 6–20)
CO2: 20 mmol/L — ABNORMAL LOW (ref 22–32)
Calcium: 9 mg/dL (ref 8.9–10.3)
Chloride: 104 mmol/L (ref 98–111)
Creatinine: 0.48 mg/dL (ref 0.44–1.00)
GFR, Estimated: >60 mL/min (ref >60)
Glucose, Bld: 118 mg/dL — ABNORMAL HIGH (ref 70–99)
Potassium: 3.5 mmol/L (ref 3.5–5.1)
Sodium: 133 mmol/L — ABNORMAL LOW (ref 135–145)
Total Bilirubin: 2.9 mg/dL — ABNORMAL HIGH (ref 0.3–1.2)
Total Protein: 7.2 g/dL (ref 6.5–8.1)

## 2022-10-17 LAB — CBC WITH DIFFERENTIAL (CANCER CENTER ONLY)
Abs Immature Granulocytes: 0.03 10*3/uL (ref 0.00–0.07)
Basophils Absolute: 0 10*3/uL (ref 0.0–0.1)
Basophils Relative: 0 %
Eosinophils Absolute: 0.2 10*3/uL (ref 0.0–0.5)
Eosinophils Relative: 3 %
HCT: 32.4 % — ABNORMAL LOW (ref 36.0–46.0)
Hemoglobin: 10.2 g/dL — ABNORMAL LOW (ref 12.0–15.0)
Immature Granulocytes: 1 %
Lymphocytes Relative: 10 %
Lymphs Abs: 0.5 10*3/uL — ABNORMAL LOW (ref 0.7–4.0)
MCH: 29 pg (ref 26.0–34.0)
MCHC: 31.5 g/dL (ref 30.0–36.0)
MCV: 92 fL (ref 80.0–100.0)
Monocytes Absolute: 0.5 10*3/uL (ref 0.1–1.0)
Monocytes Relative: 9 %
Neutro Abs: 4.2 10*3/uL (ref 1.7–7.7)
Neutrophils Relative %: 77 %
Platelet Count: 211 10*3/uL (ref 150–400)
RBC: 3.52 MIL/uL — ABNORMAL LOW (ref 3.87–5.11)
RDW: 16.5 % — ABNORMAL HIGH (ref 11.5–15.5)
WBC Count: 5.5 10*3/uL (ref 4.0–10.5)
nRBC: 0 % (ref 0.0–0.2)

## 2022-10-17 LAB — TOTAL PROTEIN, URINE DIPSTICK: Protein, ur: 30 mg/dL — AB

## 2022-10-17 MED ORDER — SODIUM CHLORIDE 0.9 % IV SOLN
10.0000 mg | Freq: Once | INTRAVENOUS | Status: AC
  Administered 2022-10-17: 10 mg via INTRAVENOUS
  Filled 2022-10-17: qty 10

## 2022-10-17 MED ORDER — HYDROMORPHONE HCL 1 MG/ML IJ SOLN: 1.0000 mg | Freq: Once | INTRAMUSCULAR | Status: DC

## 2022-10-17 MED ORDER — SODIUM CHLORIDE 0.9 % IV SOLN: Freq: Once | INTRAVENOUS | Status: AC

## 2022-10-17 MED ORDER — SODIUM CHLORIDE 0.9 % IV SOLN
5.0000 mg/kg | Freq: Once | INTRAVENOUS | Status: AC
  Administered 2022-10-17: 350 mg via INTRAVENOUS
  Filled 2022-10-17: qty 14

## 2022-10-17 MED ORDER — SODIUM CHLORIDE 0.9 % IV SOLN
1800.0000 mg/m2 | INTRAVENOUS | Status: DC
  Administered 2022-10-17: 3500 mg via INTRAVENOUS
  Filled 2022-10-17: qty 70

## 2022-10-17 MED ORDER — ATROPINE SULFATE 1 MG/ML IV SOLN
0.5000 mg | Freq: Once | INTRAVENOUS | Status: AC | PRN
  Administered 2022-10-17: 0.5 mg via INTRAVENOUS
  Filled 2022-10-17: qty 1

## 2022-10-17 MED ORDER — SODIUM CHLORIDE 0.9 % IV SOLN
400.0000 mg/m2 | Freq: Once | INTRAVENOUS | Status: AC
  Administered 2022-10-17: 700 mg via INTRAVENOUS
  Filled 2022-10-17: qty 35

## 2022-10-17 MED ORDER — DEXAMETHASONE 4 MG PO TABS
8.0000 mg | ORAL_TABLET | Freq: Every day | ORAL | 5 refills | Status: DC
Start: 2022-10-17 — End: 2022-12-12
  Filled 2022-10-17: qty 8, 4d supply, fill #0
  Filled 2022-11-26: qty 8, 4d supply, fill #1

## 2022-10-17 MED ORDER — NORMAL SALINE FLUSH 0.9 % IV SOLN
5.0000 mL | Freq: Every day | INTRAVENOUS | 2 refills | Status: DC
  Filled 2022-10-17: qty 300, 30d supply, fill #0

## 2022-10-17 MED ORDER — PALONOSETRON HCL INJECTION 0.25 MG/5ML
0.2500 mg | Freq: Once | INTRAVENOUS | Status: AC
  Administered 2022-10-17: 0.25 mg via INTRAVENOUS
  Filled 2022-10-17: qty 5

## 2022-10-17 MED ORDER — FLUOROURACIL CHEMO INJECTION 2.5 GM/50ML
400.0000 mg/m2 | Freq: Once | INTRAVENOUS | Status: AC
  Administered 2022-10-17: 700 mg via INTRAVENOUS
  Filled 2022-10-17: qty 14

## 2022-10-17 MED ORDER — SODIUM CHLORIDE 0.9 % IV SOLN
110.0000 mg/m2 | Freq: Once | INTRAVENOUS | Status: AC
  Administered 2022-10-17: 200 mg via INTRAVENOUS
  Filled 2022-10-17: qty 10

## 2022-10-17 MED ORDER — HYDROMORPHONE HCL 4 MG/ML IJ SOLN
1.0000 mg | Freq: Once | INTRAMUSCULAR | Status: AC
  Administered 2022-10-17: 1 mg via INTRAVENOUS
  Filled 2022-10-17: qty 1

## 2022-10-17 NOTE — Patient Instructions (Signed)
[facility]  Discharge Instructions: Thank you for choosing [facility] to provide your oncology and hematology care.   If you have a lab appointment with the Cancer Center, please go directly to the Cancer Center and check in at the registration area.  Wear comfortable clothing and clothing appropriate for easy access to any Portacath or PICC line.   We strive to give you quality time with your provider. You may need to reschedule your appointment if you arrive late (15 or more minutes).  Arriving late affects you and other patients whose appointments are after yours.  Also, if you miss three or more appointments without notifying the office, you may be dismissed from the clinic at the provider's discretion.      For prescription refill requests, have your pharmacy contact our office and allow 72 hours for refills to be completed.    Today you received the following chemotherapy and/or immunotherapy agents Bevacuzimab-XXXX, Irinotecan, Leucovorin and 5FU      To help prevent nausea and vomiting after your treatment, we encourage you to take your nausea medication as directed.  BELOW ARE SYMPTOMS THAT SHOULD BE REPORTED IMMEDIATELY: *FEVER GREATER THAN 100.4 F (38 C) OR HIGHER *CHILLS OR SWEATING *NAUSEA AND VOMITING THAT IS NOT CONTROLLED WITH YOUR NAUSEA MEDICATION *UNUSUAL SHORTNESS OF BREATH *UNUSUAL BRUISING OR BLEEDING *URINARY PROBLEMS (pain or burning when urinating, or frequent urination) *BOWEL PROBLEMS (unusual diarrhea, constipation, pain near the anus) TENDERNESS IN MOUTH AND THROAT WITH OR WITHOUT PRESENCE OF ULCERS (sore throat, sores in mouth, or a toothache) UNUSUAL RASH, SWELLING OR PAIN  UNUSUAL VAGINAL DISCHARGE OR ITCHING   Items with * indicate a potential emergency and should be followed up as soon as possible or go to the Emergency Department if any problems should occur.  Please show the CHEMOTHERAPY ALERT CARD  or IMMUNOTHERAPY ALERT CARD at check-in to the Emergency Department and triage nurse. Should you have questions after your visit or need to cancel or reschedule your appointment, please contact [facility] [place]  [phone] and follow the prompts.  Office hours are 8:00 a.m. to 4:30 p.m. Monday - Friday. Please note that voicemails left after 4:00 p.m. may not be returned until the following business day.  We are closed weekends and major holidays. You have access to a nurse at all times for urgent questions. Please call the main number to the clinic [phone] and follow the prompts.  For any non-urgent questions, you may also contact your provider using MyChart. We now offer e-Visits for anyone 22 and older to request care online for non-urgent symptoms. For details visit [email or web address].   Also download the MyChart app! Go to the app store, search "MyChart", open the app, select [facility] [place], and log in with your MyChart username and password.

## 2022-10-17 NOTE — Progress Notes (Signed)
Reviewed pt labs with Dr. Myna Hidalgo and pt ok to treat with total biliribun 2.9

## 2022-10-18 ENCOUNTER — Other Ambulatory Visit (HOSPITAL_BASED_OUTPATIENT_CLINIC_OR_DEPARTMENT_OTHER): Payer: Self-pay

## 2022-10-18 ENCOUNTER — Ambulatory Visit: Payer: Medicaid Other | Admitting: Dietician

## 2022-10-18 ENCOUNTER — Encounter: Payer: Self-pay | Admitting: Hematology & Oncology

## 2022-10-18 ENCOUNTER — Other Ambulatory Visit: Payer: Self-pay | Admitting: Nurse Practitioner

## 2022-10-18 ENCOUNTER — Encounter: Payer: Self-pay | Admitting: Family

## 2022-10-18 ENCOUNTER — Other Ambulatory Visit (HOSPITAL_COMMUNITY): Payer: Self-pay

## 2022-10-18 DIAGNOSIS — G893 Neoplasm related pain (acute) (chronic): Secondary | ICD-10-CM

## 2022-10-18 DIAGNOSIS — Z515 Encounter for palliative care: Secondary | ICD-10-CM

## 2022-10-18 DIAGNOSIS — C787 Secondary malignant neoplasm of liver and intrahepatic bile duct: Secondary | ICD-10-CM

## 2022-10-18 MED ORDER — MELOXICAM 15 MG PO TABS
15.0000 mg | ORAL_TABLET | Freq: Every day | ORAL | 0 refills | Status: DC
Start: 2022-10-19 — End: 2022-11-01
  Filled 2022-10-18: qty 30, 30d supply, fill #0

## 2022-10-18 MED ORDER — MORPHINE SULFATE ER 30 MG PO TBCR
30.0000 mg | EXTENDED_RELEASE_TABLET | Freq: Three times a day (TID) | ORAL | 0 refills | Status: DC
Start: 2022-10-19 — End: 2022-11-08
  Filled 2022-10-19: qty 90, 30d supply, fill #0

## 2022-10-18 MED ORDER — MELOXICAM 15 MG PO TABS
15.0000 mg | ORAL_TABLET | Freq: Every day | ORAL | 0 refills | Status: DC
Start: 2022-10-19 — End: 2022-10-18
  Filled 2022-10-18: qty 30, 30d supply, fill #0

## 2022-10-18 MED ORDER — MORPHINE SULFATE ER 30 MG PO TBCR
30.0000 mg | EXTENDED_RELEASE_TABLET | Freq: Three times a day (TID) | ORAL | 0 refills | Status: DC
Start: 2022-10-19 — End: 2022-10-18

## 2022-10-18 NOTE — Telephone Encounter (Signed)
Attempted to reach patient for a rescheduled remote nutrition consult. Her voice mail was full. Provided my cell# in text message to return the call for follow up nutrition consult.   Gennaro Africa, RDN, LDN Registered Dietitian, [facility] Part Time Remote (Usual office hours: Tuesday-Thursday) Cell: [phone]

## 2022-10-18 NOTE — Progress Notes (Signed)
Nutrition Follow Up:  Patient return messaged from cell phone.  She had been hospitalized with biliary obstruction since last nutrition consult.   to monitor PO intake and NIS.  She's not having any pain r/t eating.  She's been cooking for herself and she likes her own cooking so feels she's eating more.  Only using ONS when she's running between appointments and doesn't have time to make meals.  MD told to stop eating sugar and try to eat 4 meals a day. Intake currently: cereal, oatmeal pancakes. Started using the [facility] feels like not effecting her appetite.  She's been eating foods she enjoys which she thinks is helping her intake.  She has great attitude and is trying her best. She usually doesn't prep foods with any salt.  She also doesn't care fore any sports drinks or Pedialyte for electrolyte replacements.   Medications: Megace   Labs: 10/17/22  Na 133 (trending low), Hgb 10.2. K+ 3.5 (had been trending low)     Anthropometrics: weight loss continues  Height: 61" Weight:  10/17/22  160# 10/12/22  157# 09/24/22  164.8# 08/07/22  178# 08/13/22  174# 07/26/22  178.3 UBW: 185# BMI: 31.16     Estimated Energy Needs   Kcals: 2300-2700 Protein: 94-117 Fluid: 3 L   NUTRITION DIAGNOSIS: Inadequate PO intake for increased needs. continues     INTERVENTION:  Encouraged small frequent continued attempt at 4+ meals per day. Discouraged popcorn once again and reviewed alternative choices with high calorie and less risk of obstruction.  Encouraged use of 350 as meal replacement (loves the Ensure chocolate complete 3-4 times a week, doesn't like Strawberry, pr any blended peanut butter..   Reviewed sources of K+, encourage frozen mashed potatoes, tomato sauces (didn't like V-8 suggestion). Relayed sources of sodium in flavoring she can be more liberal with at this time.  Email K+ tip sheet.  MONITORING, EVALUATION, GOAL: weight, PO intake, Nutrition Impact Symptoms, labs Goal is weight  maintenance or slow gain (2-5#).   Next Visit: Remote next month Gennaro Africa, RDN, LDN Registered Dietitian, [facility] Part Time Remote (Usual office hours: Tuesday-Thursday) Cell: [phone]

## 2022-10-19 ENCOUNTER — Other Ambulatory Visit (HOSPITAL_BASED_OUTPATIENT_CLINIC_OR_DEPARTMENT_OTHER): Payer: Self-pay

## 2022-10-19 ENCOUNTER — Inpatient Hospital Stay: Payer: Medicaid Other

## 2022-10-19 VITALS — BP 96/57 | HR 81 | Temp 97.7°F | Resp 17

## 2022-10-19 DIAGNOSIS — Z5112 Encounter for antineoplastic immunotherapy: Secondary | ICD-10-CM | POA: Diagnosis not present

## 2022-10-19 DIAGNOSIS — C787 Secondary malignant neoplasm of liver and intrahepatic bile duct: Secondary | ICD-10-CM

## 2022-10-19 MED ORDER — SODIUM CHLORIDE 0.9% FLUSH
10.0000 mL | INTRAVENOUS | Status: DC | PRN
  Administered 2022-10-19: 10 mL

## 2022-10-19 MED ORDER — HEPARIN SOD (PORK) LOCK FLUSH 100 UNIT/ML IV SOLN
500.0000 [IU] | Freq: Once | INTRAVENOUS | Status: AC | PRN
  Administered 2022-10-19: 500 [IU]

## 2022-10-19 NOTE — Patient Instructions (Signed)

## 2022-10-23 ENCOUNTER — Other Ambulatory Visit: Payer: Self-pay

## 2022-10-23 ENCOUNTER — Encounter: Payer: Self-pay | Admitting: *Deleted

## 2022-10-23 ENCOUNTER — Other Ambulatory Visit (HOSPITAL_BASED_OUTPATIENT_CLINIC_OR_DEPARTMENT_OTHER): Payer: Self-pay

## 2022-10-23 DIAGNOSIS — K1379 Other lesions of oral mucosa: Secondary | ICD-10-CM

## 2022-10-23 DIAGNOSIS — C787 Secondary malignant neoplasm of liver and intrahepatic bile duct: Secondary | ICD-10-CM

## 2022-10-23 MED ORDER — NYSTATIN 100000 UNIT/ML MT SUSP
5.0000 mL | Freq: Four times a day (QID) | OROMUCOSAL | 0 refills | Status: DC | PRN
Start: 2022-10-23 — End: 2022-11-12
  Filled 2022-10-23: qty 240, 12d supply, fill #0

## 2022-10-23 NOTE — Telephone Encounter (Signed)
Patient called and states she has had mouth sores since Friday, discussed with MD. Verbal order for magic mouthwash received. Called and informed patient and faxed to pharmacy

## 2022-10-23 NOTE — Progress Notes (Signed)
Olanzapine 10 mg tablets approved by [employer] until 10/23/23.

## 2022-10-30 ENCOUNTER — Ambulatory Visit (HOSPITAL_COMMUNITY)
Admission: RE | Admit: 2022-10-30 | Discharge: 2022-10-30 | Disposition: A | Discharge status: Home / Self Care | Payer: Medicaid Other | Source: Ambulatory Visit | Attending: Physician Assistant | Admitting: Physician Assistant

## 2022-10-30 DIAGNOSIS — C189 Malignant neoplasm of colon, unspecified: Secondary | ICD-10-CM | POA: Insufficient documentation

## 2022-10-30 DIAGNOSIS — L24B3 Irritant contact dermatitis related to fecal or urinary stoma or fistula: Secondary | ICD-10-CM

## 2022-10-30 DIAGNOSIS — K9413 Enterostomy malfunction: Secondary | ICD-10-CM | POA: Diagnosis not present

## 2022-10-30 DIAGNOSIS — Z433 Encounter for attention to colostomy: Secondary | ICD-10-CM | POA: Diagnosis present

## 2022-10-30 NOTE — Progress Notes (Unsigned)
Palliative Medicine [facility]  [phone] [phone]   Name: Rebecca Bullock Date: 10/30/2022 MRN: [id number]  DOB: [birth year]  Patient Care Team: Nolene Ebbs as PCP - General (Family Medicine) Josph Macho, MD as Medical Oncologist (Oncology) Pickenpack-Cousar, Arty Baumgartner, NP as Nurse Practitioner (Nurse Practitioner)   INTERVAL HISTORY: Rebecca Bullock is a 57 y.o. female with oncologic medical history including colon cancer (04/2021) with metastatic disease to liver and bone. Patient underwent a diverting loop ileostomy in march 2024. Palliative ask to see for symptom and pain management and goals of care   SOCIAL HISTORY:     reports that she has never smoked. She has never used smokeless tobacco. She reports that she does not currently use alcohol. She reports that she does not currently use drugs.  ADVANCE DIRECTIVES:  None on file  CODE STATUS: Full code  PAST MEDICAL HISTORY: Past Medical History:  Diagnosis Date   Colon cancer metastasized to liver (HCC) 05/05/2021   Family history of lung cancer    Family history of pancreatic cancer    Goals of care, counseling/discussion 05/05/2021   History of radiation therapy    Lumbar Spine- 07/12/22-07/25/22- Dr. Antony Blackbird    ALLERGIES:  is allergic to augmentin [amoxicillin-pot clavulanate], irinotecan, metronidazole, vancomycin, latex, tape, and wound dressing adhesive.  MEDICATIONS:  Current Outpatient Medications  Medication Sig Dispense Refill   alum & mag hydroxide-simeth suspension-nystatin suspension-diphenhydrAMINE liquid Take 5 mLs by mouth 4 (four) times daily as needed for mouth pain. 240 mL 0   baclofen (LIORESAL) 10 MG tablet Take 1 tablet (10 mg total) by mouth 3 (three) times daily. 60 each 2   dexamethasone (DECADRON) 4 MG tablet Take 2 tablets (8 mg total) by mouth daily. Start the day after chemotherapy for 2 days. Take with food. 8 tablet 5   famotidine  (PEPCID) 40 MG tablet Take 1 tablet (40 mg total) by mouth 2 (two) times daily. 60 tablet 4   fruquintinib (FRUZAQLA) 1 MG capsule Take 3 capsules (3 mg total) by mouth daily. Take for 21 days on, then off for 7 days. Repeat every 28 days. (Patient not taking: Reported on 10/09/2022) 63 capsule 4   HYDROmorphone (DILAUDID) 4 MG tablet Take 1 tablet (4 mg total) by mouth every 2 (two) hours as needed for severe pain. 160 tablet 0   LORazepam (ATIVAN) 0.5 MG tablet Place 1 tablet (0.5 mg total) under the tongue every 4 (four) hours as needed for anxiety. 60 tablet 0   megestrol (MEGACE) 400 MG/10ML suspension Take 10 mLs (400 mg total) by mouth 2 (two) times daily. 960 mL 2   meloxicam (MOBIC) 15 MG tablet Take 1 tablet (15 mg total) by mouth daily. 30 tablet 0   morphine (MS CONTIN) 30 MG 12 hr tablet Take 1 tablet (30 mg total) by mouth every 8 (eight) hours. 90 tablet 0   OLANZapine (ZYPREXA) 10 MG tablet Take 1 tablet (10 mg total) by mouth at bedtime. 30 tablet 3   ondansetron (ZOFRAN) 4 MG tablet Take 1 tablet (4 mg total) by mouth every 6 (six) hours. 30 tablet 3   scopolamine (TRANSDERM-SCOP) 1 MG/3DAYS Place one patch behind the ear every three days. 10 patch 3   Sodium Chloride Flush (NORMAL SALINE FLUSH) 0.9 % SOLN Flush abdominal drain with 5 cc sterile saline once daily 300 mL 2   No current facility-administered medications for this visit.   Facility-Administered Medications  Ordered in Other Visits  Medication Dose Route Frequency Provider Last Rate Last Admin   atropine 1 MG/ML injection            heparin lock flush 100 unit/mL  500 Units Intravenous Once Ennever, Rose Phi, MD       HYDROmorphone (DILAUDID) 4 MG/ML injection            ketorolac (TORADOL) 15 MG/ML injection            LORazepam (ATIVAN) 2 MG/ML injection            sodium chloride flush (NS) 0.9 % injection 10 mL  10 mL Intracatheter Once PRN Erenest Blank, NP       sodium chloride flush (NS) 0.9 % injection 10 mL   10 mL Intravenous PRN Josph Macho, MD   10 mL at 10/31/21 1258    VITAL SIGNS: There were no vitals taken for this visit. There were no vitals filed for this visit.  Estimated body mass index is 30.23 kg/m as calculated from the following:   Height as of 10/12/22: 5\' 1"  (1.549 m).   Weight as of 10/17/22: 160 lb (72.6 kg).   PERFORMANCE STATUS (ECOG) : 1 - Symptomatic but completely ambulatory  Assessment NAD, in wheelchair RRR Normal breathing pattern Abdomen tender, distended, ostomy and biliary drain in place (capped) AAO x4  IMPRESSION:  1.   Pain Sunya reports her pain is controlled overall. Her biggest discomfort is late evening, night, and early morning. She is unsure if this is associated with her positioning in bed or if it is because she is relaxed with time to focus on things. She is waking up about every 2-3 hours and having to take PRNs. She is doing well in the daytime hours. Her family has journal documenting pain and when she is taking medications. On average during the day she is able to go 4-5 (sometimes 6 hours) without dilaudid. Shares that the mobic helps her during the day.   Odella describes pains at worst a pulling and spasm feeling. She has baclofen on hand but is not using regularly. Family does not want her to be oversedated however would like symptoms to be managed. We discussed use of baclofen early afternoon or near bedtime not in conjunction with dilaudid as well as once in the morning.   Pain ultimately seems better controlled at this time. Patient and family agrees that no changes are needed except use of the baclofen. She has not taken dilaudid since 630 am today and is comfortable with pain score of 2.   We will continue to closely monitor and support.    2.  Nausea/vomiting.  Controlled.   3.  Decreased appetite Appetite is improving. Some days better than others.   4.  Goals of Care Ms. Gennusa and family expressed her wishes are to  continue to treat for as long as she can focusing on her quality of live and allowing her every opportunity to thrive.   She would really like to get her advanced directives completed. Unfortunately this was not done while hospitalized. Will discuss with Larita Fife, SW to see about assisting with completion.   Patient and family would like to complete MOST form today. Education provided. Patient and family outlined their wishes for the following treatment decisions:  Cardiopulmonary Resuscitation: Do Not Attempt Resuscitation (DNR/No CPR)  Medical Interventions: Limited Additional Interventions: Use medical treatment, IV fluids and cardiac monitoring as indicated, DO NOT USE intubation  or mechanical ventilation. May consider use of less invasive airway support such as BiPAP or CPAP. Also provide comfort measures. Transfer to the hospital if indicated. Avoid intensive care.   Antibiotics: Antibiotics if indicated  IV Fluids: IV fluids if indicated  Feeding Tube: Feeding tube for a defined trial period would not want long-term      4/29- Ms. Benston has three adult children. She lives with her daughter Darl Pikes who works during the day and is at home during the night. Lyda Kalata and boyfriend Jeannett Senior check on patient intermittently throughout the day. While patient does not have documents in place as of yet, she requests that these individuals be involved in her care.   Ms. Lawwill relates not being ready to make a decision about her code status at this time. She is, however, open to education and expresses wanting to get this completed in the near future.   Ms. Brignoni current goal is to achieve better pain management, while maintaining as much alertness and physical function as possible.  We discussed Her current illness and what it means in the larger context of Her on-going co-morbidities. Natural disease trajectory and expectations were discussed.  I discussed the importance of continued  conversation with family and their medical providers regarding overall plan of care and treatment options, ensuring decisions are within the context of the patients values and GOCs.  PLAN: Ongoing symptom management support. Overall patient is doing much better. No changes to current regimen.  Patient wishes to complete advanced directives. Unfortunately this did not get done while hospitalized. Will arrange for completion.  MOST form completed today. See above. Original given to patient.  For pain:  MS Contin 30 mg every 8 hours. Dilaudid to 4 mg every 2-3 hours as needed for severe pain. Baclofen as needed. Will plan to schedule dose late evening early morning for spasms.  Mobic daily. For nausea/vomiting/appetite: Pepcid for dyspepsia. Continue Scopolamine patch. Continue Zyprexa at bedtime. Zofran and Compazine available as needed. Ativan as needed for nausea/vomiting  Education provided on MS Contin, Dilaudid, Baclofen, Zyprexa, and Ativan and their sedating effects. Instructed NOT to take these medications at the same time due to concerns for over-sedation and respiratory depression. Should take at minimum 1-2 hours apart if needed. Pain contract on file.   Patient aware to call sooner for needs, questions, concerns.  Patient expressed understanding and was in agreement with this plan. She also understands that She can call the clinic at any time with any questions, concerns, or complaints.   Any controlled substances utilized were prescribed in the context of palliative care. PDMP has been reviewed.    Visit consisted of counseling and education dealing with the complex and emotionally intense issues of symptom management and palliative care in the setting of serious and potentially life-threatening illness.Greater than 50%  of this time was spent counseling and coordinating care related to the above assessment and plan.  Willette Alma, AGPCNP-BC  Palliative Medicine  Team/[facility]  *Please note that this is a verbal dictation therefore any spelling or grammatical errors are due to the "[employer]agon [employer] One" system interpretation.

## 2022-10-30 NOTE — Progress Notes (Signed)
[facility]   Reason for visit:  RLQ ileostomy, retention bridge removed at surgeon's office  Pouching has improved.  HPI:  Colon cancer with resection and colostomy Past Medical History:  Diagnosis Date   Colon cancer metastasized to liver (HCC) 05/05/2021   Family history of lung cancer    Family history of pancreatic cancer    Goals of care, counseling/discussion 05/05/2021   History of radiation therapy    Lumbar Spine- 07/12/22-07/25/22- Dr. Antony Blackbird   Family History  Problem Relation Age of Onset   Lung cancer Mother    Diabetes Maternal Grandmother    Pancreatic cancer Maternal Grandfather    Multiple sclerosis Maternal Aunt    Alcohol abuse Maternal Uncle    Allergies  Allergen Reactions   Augmentin [Amoxicillin-Pot Clavulanate] Diarrhea and Other (See Comments)    Extreme diarrhea, abdominal pain.   Irinotecan Other (See Comments)    Flushing, tingling, visual disturbance   Metronidazole Diarrhea and Other (See Comments)   Vancomycin Rash and Other (See Comments)    "Red Man Syndrome"   Latex Rash   Tape Rash   Wound Dressing Adhesive Rash   Current Outpatient Medications  Medication Sig Dispense Refill Last Dose   alum & mag hydroxide-simeth suspension-nystatin suspension-diphenhydrAMINE liquid Take 5 mLs by mouth 4 (four) times daily as needed for mouth pain. 240 mL 0    baclofen (LIORESAL) 10 MG tablet Take 1 tablet (10 mg total) by mouth 3 (three) times daily. 60 each 2    dexamethasone (DECADRON) 4 MG tablet Take 2 tablets (8 mg total) by mouth daily. Start the day after chemotherapy for 2 days. Take with food. 8 tablet 5    famotidine (PEPCID) 40 MG tablet Take 1 tablet (40 mg total) by mouth 2 (two) times daily. 60 tablet 4    HYDROmorphone (DILAUDID) 4 MG tablet Take 1 tablet (4 mg total) by mouth every 2 (two) hours as needed for severe pain. 160 tablet 0    LORazepam (ATIVAN) 0.5 MG tablet Place 1 tablet (0.5 mg total) under the tongue  every 4 (four) hours as needed for anxiety. 60 tablet 0    megestrol (MEGACE) 400 MG/10ML suspension Take 10 mLs (400 mg total) by mouth 2 (two) times daily. 960 mL 2    meloxicam (MOBIC) 7.5 MG tablet Take 1 tablet (7.5 mg total) by mouth 2 (two) times daily. 60 tablet 0    morphine (MS CONTIN) 30 MG 12 hr tablet Take 1 tablet (30 mg total) by mouth every 8 (eight) hours. 90 tablet 0    nystatin (MYCOSTATIN) 100000 UNIT/ML suspension Take by mouth.      OLANZapine (ZYPREXA) 10 MG tablet Take 1 tablet (10 mg total) by mouth at bedtime. 30 tablet 3    ondansetron (ZOFRAN) 4 MG tablet Take 1 tablet (4 mg total) by mouth every 6 (six) hours. 30 tablet 3    scopolamine (TRANSDERM-SCOP) 1 MG/3DAYS Place one patch behind the ear every three days. 10 patch 3    Sodium Chloride Flush (NORMAL SALINE FLUSH) 0.9 % SOLN Flush abdominal drain with 5 cc sterile saline once daily 300 mL 2    No current facility-administered medications for this encounter.   Facility-Administered Medications Ordered in Other Encounters  Medication Dose Route Frequency Provider Last Rate Last Admin   atropine 1 MG/ML injection            heparin lock flush 100 unit/mL  500 Units Intravenous Once Ennever,  Rose Phi, MD       HYDROmorphone (DILAUDID) 4 MG/ML injection            ketorolac (TORADOL) 15 MG/ML injection            LORazepam (ATIVAN) 2 MG/ML injection            sodium chloride flush (NS) 0.9 % injection 10 mL  10 mL Intracatheter Once PRN Erenest Blank, NP       sodium chloride flush (NS) 0.9 % injection 10 mL  10 mL Intravenous PRN Josph Macho, MD   10 mL at 10/31/21 1258   sodium chloride flush (NS) 0.9 % injection 10 mL  10 mL Intracatheter PRN Josph Macho, MD   10 mL at 11/02/22 0930   ROS  Review of Systems  Constitutional:  Positive for fatigue.  Gastrointestinal:        RMQ ileostomy  Skin:  Positive for color change and rash.       MARSI to pouching skin from barrier adhesive Scarring to  peristomal skin where retention bridge was in place  All other systems reviewed and are negative.  Vital signs:  BP 122/68   Pulse 88   Temp 97.6 F (36.4 C)   Resp 18   SpO2 99%  Exam:  Physical Exam Vitals reviewed.  Constitutional:      Appearance: Normal appearance.  HENT:     Mouth/Throat:     Mouth: Mucous membranes are moist.  Abdominal:     Palpations: Abdomen is soft.     Tenderness: There is abdominal tenderness.  Skin:    General: Skin is warm and dry.     Findings: Erythema and lesion present.  Neurological:     Mental Status: She is alert and oriented to person, place, and time.  Psychiatric:        Mood and Affect: Mood normal.        Behavior: Behavior normal.     Stoma type/location:  RMQ ileostomy Stomal assessment/size:  1 3/4" loop ileostomy, oval Peristomal assessment:  blistering to perimeter of pouching area. A new family member has been applying her pouches.  Daughter was primary caregiver.  Findings consistent with medical adhesive related skin injury  Treatment options for stomal/peristomal skin: stoma powder and skin prep to irritated skin.  Adjust barrier to avoid blistering.  Education on application technique (not stretching barrier too tight. Output: liquid tan stool Ostomy pouching: 2pc. Pouch with barrier ring.  Stoma powder and skin prep Education provided:  application technique, not stretching adhesive tightly over skin.  With movement, the barrier is not yielding and is stripping the skin.     Impression/dx  Ileostomy Medical adhesive related skin injury Discussion  Continue same pouch.  Will add adhesive remover to aid in pouch removal Plan  See back as needed Update [place] on order (adding remover)     Visit time: 45 minutes.   Maple Hudson FNP-BC

## 2022-10-31 ENCOUNTER — Inpatient Hospital Stay (HOSPITAL_BASED_OUTPATIENT_CLINIC_OR_DEPARTMENT_OTHER): Payer: Medicaid Other | Admitting: Hematology & Oncology

## 2022-10-31 ENCOUNTER — Inpatient Hospital Stay: Payer: Medicaid Other

## 2022-10-31 ENCOUNTER — Encounter: Payer: Self-pay | Admitting: Hematology & Oncology

## 2022-10-31 ENCOUNTER — Inpatient Hospital Stay: Payer: Medicaid Other | Admitting: Licensed Clinical Social Worker

## 2022-10-31 ENCOUNTER — Inpatient Hospital Stay: Payer: Medicaid Other | Attending: Hematology & Oncology

## 2022-10-31 ENCOUNTER — Other Ambulatory Visit: Payer: Self-pay

## 2022-10-31 VITALS — BP 114/53 | HR 80 | Temp 98.0°F | Resp 19 | Ht 61.0 in | Wt 158.0 lb

## 2022-10-31 DIAGNOSIS — Z5112 Encounter for antineoplastic immunotherapy: Secondary | ICD-10-CM | POA: Insufficient documentation

## 2022-10-31 DIAGNOSIS — Z5111 Encounter for antineoplastic chemotherapy: Secondary | ICD-10-CM | POA: Insufficient documentation

## 2022-10-31 DIAGNOSIS — Z5189 Encounter for other specified aftercare: Secondary | ICD-10-CM | POA: Insufficient documentation

## 2022-10-31 DIAGNOSIS — C189 Malignant neoplasm of colon, unspecified: Secondary | ICD-10-CM | POA: Diagnosis not present

## 2022-10-31 DIAGNOSIS — Z452 Encounter for adjustment and management of vascular access device: Secondary | ICD-10-CM | POA: Insufficient documentation

## 2022-10-31 DIAGNOSIS — C787 Secondary malignant neoplasm of liver and intrahepatic bile duct: Secondary | ICD-10-CM | POA: Diagnosis not present

## 2022-10-31 DIAGNOSIS — C182 Malignant neoplasm of ascending colon: Secondary | ICD-10-CM | POA: Diagnosis present

## 2022-10-31 LAB — CBC WITH DIFFERENTIAL (CANCER CENTER ONLY)
Abs Immature Granulocytes: 0 10*3/uL (ref 0.00–0.07)
Basophils Absolute: 0 10*3/uL (ref 0.0–0.1)
Basophils Relative: 1 %
Eosinophils Absolute: 0.1 10*3/uL (ref 0.0–0.5)
Eosinophils Relative: 5 %
HCT: 29.8 % — ABNORMAL LOW (ref 36.0–46.0)
Hemoglobin: 9.6 g/dL — ABNORMAL LOW (ref 12.0–15.0)
Immature Granulocytes: 0 %
Lymphocytes Relative: 35 %
Lymphs Abs: 0.7 10*3/uL (ref 0.7–4.0)
MCH: 29.1 pg (ref 26.0–34.0)
MCHC: 32.2 g/dL (ref 30.0–36.0)
MCV: 90.3 fL (ref 80.0–100.0)
Monocytes Absolute: 0.4 10*3/uL (ref 0.1–1.0)
Monocytes Relative: 20 %
Neutro Abs: 0.8 10*3/uL — ABNORMAL LOW (ref 1.7–7.7)
Neutrophils Relative %: 39 %
Platelet Count: 145 10*3/uL — ABNORMAL LOW (ref 150–400)
RBC: 3.3 MIL/uL — ABNORMAL LOW (ref 3.87–5.11)
RDW: 16.6 % — ABNORMAL HIGH (ref 11.5–15.5)
WBC Count: 2 10*3/uL — ABNORMAL LOW (ref 4.0–10.5)
nRBC: 0 % (ref 0.0–0.2)

## 2022-10-31 LAB — CMP (CANCER CENTER ONLY)
ALT: 24 U/L (ref 0–44)
AST: 30 U/L (ref 15–41)
Albumin: 2.7 g/dL — ABNORMAL LOW (ref 3.5–5.0)
Alkaline Phosphatase: 217 U/L — ABNORMAL HIGH (ref 38–126)
Anion gap: 8 (ref 5–15)
BUN: 6 mg/dL (ref 6–20)
CO2: 21 mmol/L — ABNORMAL LOW (ref 22–32)
Calcium: 8.4 mg/dL — ABNORMAL LOW (ref 8.9–10.3)
Chloride: 107 mmol/L (ref 98–111)
Creatinine: 0.47 mg/dL (ref 0.44–1.00)
GFR, Estimated: >60 mL/min (ref >60)
Glucose, Bld: 105 mg/dL — ABNORMAL HIGH (ref 70–99)
Potassium: 3.2 mmol/L — ABNORMAL LOW (ref 3.5–5.1)
Sodium: 136 mmol/L (ref 135–145)
Total Bilirubin: 1.1 mg/dL (ref 0.3–1.2)
Total Protein: 7.1 g/dL (ref 6.5–8.1)

## 2022-10-31 LAB — CEA (IN HOUSE-CHCC): CEA (CHCC-In House): 8.89 ng/mL — ABNORMAL HIGH (ref 0.00–5.00)

## 2022-10-31 LAB — PREALBUMIN: Prealbumin: 12 mg/dL — ABNORMAL LOW (ref 18–38)

## 2022-10-31 LAB — SAMPLE TO BLOOD BANK

## 2022-10-31 MED ORDER — HYDROMORPHONE HCL 1 MG/ML IJ SOLN
1.0000 mg | Freq: Once | INTRAMUSCULAR | Status: AC
  Administered 2022-10-31: 1 mg via INTRAVENOUS
  Filled 2022-10-31: qty 1

## 2022-10-31 MED ORDER — SODIUM CHLORIDE 0.9 % IV SOLN: Freq: Once | INTRAVENOUS | Status: AC

## 2022-10-31 MED ORDER — SODIUM CHLORIDE 0.9% FLUSH: 10.0000 mL | INTRAVENOUS | Status: DC | PRN

## 2022-10-31 MED ORDER — PALONOSETRON HCL INJECTION 0.25 MG/5ML
0.2500 mg | Freq: Once | INTRAVENOUS | Status: AC
  Administered 2022-10-31: 0.25 mg via INTRAVENOUS
  Filled 2022-10-31: qty 5

## 2022-10-31 MED ORDER — SODIUM CHLORIDE 0.9 % IV SOLN
1800.0000 mg/m2 | INTRAVENOUS | Status: DC
  Administered 2022-10-31: 3500 mg via INTRAVENOUS
  Filled 2022-10-31: qty 70

## 2022-10-31 MED ORDER — SODIUM CHLORIDE 0.9 % IV SOLN
110.0000 mg/m2 | Freq: Once | INTRAVENOUS | Status: AC
  Administered 2022-10-31: 200 mg via INTRAVENOUS
  Filled 2022-10-31: qty 10

## 2022-10-31 MED ORDER — HEPARIN SOD (PORK) LOCK FLUSH 100 UNIT/ML IV SOLN: 500.0000 [IU] | Freq: Once | INTRAVENOUS | Status: DC | PRN

## 2022-10-31 MED ORDER — SODIUM CHLORIDE 0.9 % IV SOLN
400.0000 mg/m2 | Freq: Once | INTRAVENOUS | Status: AC
  Administered 2022-10-31: 700 mg via INTRAVENOUS
  Filled 2022-10-31: qty 35

## 2022-10-31 MED ORDER — SODIUM CHLORIDE 0.9 % IV SOLN
10.0000 mg | Freq: Once | INTRAVENOUS | Status: AC
  Administered 2022-10-31: 10 mg via INTRAVENOUS
  Filled 2022-10-31: qty 10

## 2022-10-31 MED ORDER — SODIUM CHLORIDE 0.9 % IV SOLN
5.0000 mg/kg | Freq: Once | INTRAVENOUS | Status: AC
  Administered 2022-10-31: 350 mg via INTRAVENOUS
  Filled 2022-10-31: qty 14

## 2022-10-31 MED ORDER — FLUOROURACIL CHEMO INJECTION 2.5 GM/50ML
400.0000 mg/m2 | Freq: Once | INTRAVENOUS | Status: AC
  Administered 2022-10-31: 700 mg via INTRAVENOUS
  Filled 2022-10-31: qty 14

## 2022-10-31 NOTE — Progress Notes (Signed)
Hematology and Oncology Follow Up Visit  Rebecca Bullock [id number] [birth year] 57 y.o. 10/31/2022   Principle Diagnosis:  Metastatic colorectal cancer-liver metastasis- BRAF (+)   Current Therapy:        Status post cycle 1 of chemotherapy with FOLFOXIRI/Avastin Encorafenib/Vectibix -- started on 06/19/2021, s/p cycle #6 -- d/c on 10/31/2021 Intrahepatic TACE -- 03/29/2022 FOLFOXIRI/Avastin-- s/p cycle  #5-- start on 02/19/2022, -oxaliplatin dropped on 04/03/2022 -- d/c on 07/03/2022 IV iron-Ferrlecit given on 05/10/2022  Lonsurf/Avastin -- s/p cycle #1 -- start on 07/07/2022 -DC on 08/15/2022 Zometa 4 mg IV every 3 months -next dose on 11/2022  XRT to L1 vertebral body --start on 07/10/2022 Fruquintinib 5 mg po q day (21/7) --  start on 04/`09/2022 --changed to 3 mg p.o. daily (21/7) on 09/25/2022 FOLFIRI/Avastin -- s/p cycle #1 -- start on 10/17/2022               Interim History:  Rebecca Bullock is here today for follow-up.  She actually tolerated her first cycle of chemotherapy quite nicely.  She really looks quite good.  Hopefully, her bilirubin is still down..  She has a biliary catheter that is draining.  Hopefully Interventional Radiology will be able to exchange this for something that is internal.  She has a colostomy that is working quite nicely.  She has had no problems with cough or shortness of breath.  Her pain seems to be doing a little bit better.  She has had no bleeding.  There is been no leg swelling.  She has had no headache.  She had mouth sores but these are improved.  I must say that I am just very happy that she is feeling better.  She does feel better.  She is eating a little bit better.  Of note, her last CEA level was 16.3.  Overall, I would say that her performance status is probably ECOG 1.    Medications:  Allergies as of 10/31/2022       Reactions   Augmentin [amoxicillin-pot Clavulanate] Diarrhea, Other (See Comments)   Extreme diarrhea, abdominal pain.    Irinotecan Other (See Comments)   Flushing, tingling, visual disturbance   Metronidazole Diarrhea, Other (See Comments)   Vancomycin Rash, Other (See Comments)   "Red Man Syndrome"   Latex Rash   Tape Rash   Wound Dressing Adhesive Rash        Medication List        Accurate as of October 31, 2022  8:38 AM. If you have any questions, ask your nurse or doctor.          STOP taking these medications    Fruzaqla 1 MG capsule Generic drug: fruquintinib Stopped by: Josph Macho, MD       TAKE these medications    alum & mag hydroxide-simeth suspension-nystatin suspension-diphenhydrAMINE liquid Take 5 mLs by mouth 4 (four) times daily as needed for mouth pain.   baclofen 10 MG tablet Commonly known as: LIORESAL Take 1 tablet (10 mg total) by mouth 3 (three) times daily.   dexamethasone 4 MG tablet Commonly known as: DECADRON Take 2 tablets (8 mg total) by mouth daily. Start the day after chemotherapy for 2 days. Take with food.   famotidine 40 MG tablet Commonly known as: PEPCID Take 1 tablet (40 mg total) by mouth 2 (two) times daily.   HYDROmorphone 4 MG tablet Commonly known as: Dilaudid Take 1 tablet (4 mg total) by mouth every 2 (two) hours as needed for  severe pain.   LORazepam 0.5 MG tablet Commonly known as: ATIVAN Place 1 tablet (0.5 mg total) under the tongue every 4 (four) hours as needed for anxiety.   megestrol 40 MG/ML suspension Commonly known as: MEGACE Take 10 mLs (400 mg total) by mouth 2 (two) times daily.   meloxicam 15 MG tablet Commonly known as: MOBIC Take 1 tablet (15 mg total) by mouth daily.   morphine 30 MG 12 hr tablet Commonly known as: MS CONTIN Take 1 tablet (30 mg total) by mouth every 8 (eight) hours.   Normal Saline Flush 0.9 % Soln Flush abdominal drain with 5 cc sterile saline once daily   nystatin 100000 UNIT/ML suspension Commonly known as: MYCOSTATIN Take by mouth.   OLANZapine 10 MG tablet Commonly known as:  ZYPREXA Take 1 tablet (10 mg total) by mouth at bedtime.   ondansetron 4 MG tablet Commonly known as: ZOFRAN Take 1 tablet (4 mg total) by mouth every 6 (six) hours.   Transderm-Scop 1 MG/3DAYS Generic drug: scopolamine Place one patch behind the ear every three days.        Allergies:  Allergies  Allergen Reactions   Augmentin [Amoxicillin-Pot Clavulanate] Diarrhea and Other (See Comments)    Extreme diarrhea, abdominal pain.   Irinotecan Other (See Comments)    Flushing, tingling, visual disturbance   Metronidazole Diarrhea and Other (See Comments)   Vancomycin Rash and Other (See Comments)    "Red Man Syndrome"   Latex Rash   Tape Rash   Wound Dressing Adhesive Rash    Past Medical History, Surgical history, Social history, and Family History were reviewed and updated.  Review of Systems: Review of Systems  Constitutional: Negative.   HENT: Negative.    Eyes: Negative.   Respiratory: Negative.    Cardiovascular: Negative.   Gastrointestinal: Negative.        Constipation alternating with diarrhea.   Genitourinary: Negative.   Musculoskeletal: Negative.   Skin: Negative.   Neurological: Negative.   Endo/Heme/Allergies: Negative.   Psychiatric/Behavioral: Negative.       Physical Exam: Temperature is 98.  Pulse 80.  Blood pressure 114/53.  Weight is 158 pounds.       Wt Readings from Last 3 Encounters:  10/31/22 158 lb (71.7 kg)  10/17/22 160 lb (72.6 kg)  10/12/22 157 lb 1.9 oz (71.3 kg)    Physical Exam Vitals reviewed.  Constitutional:      Comments: This is a well-developed and well-nourished white female.  There is no scleral icterus.    HENT:     Head: Normocephalic and atraumatic.  Eyes:     General: Scleral icterus present.     Pupils: Pupils are equal, round, and reactive to light.  Cardiovascular:     Rate and Rhythm: Normal rate and regular rhythm.     Heart sounds: Normal heart sounds.  Pulmonary:     Effort: Pulmonary effort is  normal.     Breath sounds: Normal breath sounds.  Abdominal:     General: Bowel sounds are normal.     Palpations: Abdomen is soft.     Comments: Abdominal exam is soft.  She has colostomy intact.  She has a biliary drain catheter also.  There is no obvious fluid wave.  There is no obvious abdominal mass.  Her liver edge might be at the right costal margin.   Musculoskeletal:        General: No tenderness or deformity. Normal range of motion.  Cervical back: Normal range of motion.  Lymphadenopathy:     Cervical: No cervical adenopathy.  Skin:    General: Skin is warm and dry.     Findings: No erythema or rash.  Neurological:     Mental Status: She is alert and oriented to person, place, and time.  Psychiatric:        Behavior: Behavior normal.        Thought Content: Thought content normal.        Judgment: Judgment normal.    Lab Results  Component Value Date   WBC 2.0 (L) 10/31/2022   HGB 9.6 (L) 10/31/2022   HCT 29.8 (L) 10/31/2022   MCV 90.3 10/31/2022   PLT 145 (L) 10/31/2022   Lab Results  Component Value Date   FERRITIN 312 (H) 09/21/2022   IRON 58 09/21/2022   TIBC 286 09/21/2022   UIBC 228 09/21/2022   IRONPCTSAT 20 09/21/2022   Lab Results  Component Value Date   RETICCTPCT 1.6 08/07/2022   RBC 3.30 (L) 10/31/2022   No results found for: "KPAFRELGTCHN", "LAMBDASER", "KAPLAMBRATIO" No results found for: "IGGSERUM", "IGA", "IGMSERUM" No results found for: "TOTALPROTELP", "ALBUMINELP", "A1GS", "A2GS", "BETS", "BETA2SER", "GAMS", "MSPIKE", "SPEI"   Chemistry      Component Value Date/Time   NA 133 (L) 10/17/2022 0950   K 3.5 10/17/2022 0950   CL 104 10/17/2022 0950   CO2 20 (L) 10/17/2022 0950   BUN 10 10/17/2022 0950   CREATININE 0.48 10/17/2022 0950   CREATININE 0.56 03/23/2021 0000      Component Value Date/Time   CALCIUM 9.0 10/17/2022 0950   ALKPHOS 299 (H) 10/17/2022 0950   AST 36 10/17/2022 0950   ALT 20 10/17/2022 0950   BILITOT 2.9 (H)  10/17/2022 0950       Impression and Plan: Rebecca Bullock is a very charming  57 yo caucasian female with metastatic colon cancer.  She had disease confined to her liver.  However, she then began to have progression.  She had disease in her L1 vertebral body.  She has received radiation therapy to this.  We had her on Lonsurf and she could not tolerate this at all.  She has been hospitalized.  She then developed a bowel obstruction requiring emergency surgery for this.  She was then hospitalized again with biliary obstruction.  She had a biliary drain catheter placed.  I know this is a very tough situation.  I know her treatment options or not all that wonderful.  Hopefully, she will respond to the FOLFIRI/Avastin.  My goal is to make sure that she has good quality of life while being treated.  I know she just had her birthday.  Hopefully, she is able to enjoy her birthday.  We will continue to treat her as long as she is doing well and responding.  We will see what her CEA level is.  We will plan to get her back in another 2 weeks.  We will try to go for treatments and then repeat her scans.  Again, the CEA level will tell us how she is doing.  Of note, her white cell count is on the low side.  I think we will going had to have her on G-CSF after treatment to try to maintain her white cell level.   Josph Macho, MD 6/5/20248:38 AM

## 2022-10-31 NOTE — Progress Notes (Signed)
[facility] CSW Progress Note  Visual merchandiser met with patient to assess needs while she was in infusion.  She stated she was doing well.  She is staying with her daughter and grandson.  She reports her appetite is at about 50%.  Provided her with some Chocolate Ensure Samples since she said that was the only flavor the liked.  She displayed a bright affect and answered questions appropriately.    Dorothey Baseman, LCSW Clinical Social Worker [facility]    Patient is participating in a Managed Medicaid Plan:  Yes

## 2022-10-31 NOTE — Progress Notes (Signed)
Pt c/o pain, request pain medication. Reviewed with MD, VO for " dilaudid 1mg   once"

## 2022-10-31 NOTE — Patient Instructions (Signed)

## 2022-11-01 ENCOUNTER — Encounter: Payer: Self-pay | Admitting: Nurse Practitioner

## 2022-11-01 ENCOUNTER — Telehealth: Payer: Self-pay

## 2022-11-01 ENCOUNTER — Inpatient Hospital Stay: Payer: Medicaid Other | Attending: Hematology & Oncology | Admitting: Nurse Practitioner

## 2022-11-01 ENCOUNTER — Other Ambulatory Visit: Payer: Self-pay

## 2022-11-01 ENCOUNTER — Other Ambulatory Visit (HOSPITAL_BASED_OUTPATIENT_CLINIC_OR_DEPARTMENT_OTHER): Payer: Self-pay

## 2022-11-01 VITALS — BP 100/61 | HR 62 | Temp 97.2°F | Resp 18 | Wt 162.3 lb

## 2022-11-01 DIAGNOSIS — C189 Malignant neoplasm of colon, unspecified: Secondary | ICD-10-CM

## 2022-11-01 DIAGNOSIS — Z515 Encounter for palliative care: Secondary | ICD-10-CM

## 2022-11-01 DIAGNOSIS — R530 Neoplastic (malignant) related fatigue: Secondary | ICD-10-CM

## 2022-11-01 DIAGNOSIS — G893 Neoplasm related pain (acute) (chronic): Secondary | ICD-10-CM

## 2022-11-01 DIAGNOSIS — C787 Secondary malignant neoplasm of liver and intrahepatic bile duct: Secondary | ICD-10-CM

## 2022-11-01 MED ORDER — MELOXICAM 7.5 MG PO TABS
7.5000 mg | ORAL_TABLET | Freq: Two times a day (BID) | ORAL | 0 refills | Status: DC
  Filled 2022-11-01: qty 60, 30d supply, fill #0

## 2022-11-01 NOTE — Telephone Encounter (Signed)
-----   Message from Josph Macho, MD sent at 11/01/2022  5:23 AM EDT ----- Call - the CEA marker went down by 50%!!!!!  SWEET!!  Cindee Lame

## 2022-11-01 NOTE — Telephone Encounter (Signed)
Advised via MyChart.

## 2022-11-02 ENCOUNTER — Encounter (HOSPITAL_BASED_OUTPATIENT_CLINIC_OR_DEPARTMENT_OTHER): Payer: Self-pay | Admitting: Emergency Medicine

## 2022-11-02 ENCOUNTER — Emergency Department (HOSPITAL_BASED_OUTPATIENT_CLINIC_OR_DEPARTMENT_OTHER): Payer: Medicaid Other

## 2022-11-02 ENCOUNTER — Inpatient Hospital Stay: Payer: Medicaid Other

## 2022-11-02 ENCOUNTER — Telehealth: Payer: Self-pay | Admitting: *Deleted

## 2022-11-02 ENCOUNTER — Other Ambulatory Visit (HOSPITAL_COMMUNITY): Payer: Self-pay | Admitting: Nurse Practitioner

## 2022-11-02 ENCOUNTER — Inpatient Hospital Stay (HOSPITAL_BASED_OUTPATIENT_CLINIC_OR_DEPARTMENT_OTHER)
Admission: EM | Admit: 2022-11-02 | Discharge: 2022-11-08 | DRG: 871 | Disposition: A | Discharge status: Home / Self Care | Payer: Medicaid Other | Attending: Internal Medicine | Admitting: Internal Medicine

## 2022-11-02 ENCOUNTER — Other Ambulatory Visit: Payer: Self-pay

## 2022-11-02 VITALS — BP 126/67 | HR 88 | Temp 98.5°F | Resp 20

## 2022-11-02 DIAGNOSIS — Z91040 Latex allergy status: Secondary | ICD-10-CM

## 2022-11-02 DIAGNOSIS — Z933 Colostomy status: Secondary | ICD-10-CM

## 2022-11-02 DIAGNOSIS — Z79899 Other long term (current) drug therapy: Secondary | ICD-10-CM

## 2022-11-02 DIAGNOSIS — C19 Malignant neoplasm of rectosigmoid junction: Secondary | ICD-10-CM | POA: Diagnosis present

## 2022-11-02 DIAGNOSIS — K9413 Enterostomy malfunction: Secondary | ICD-10-CM

## 2022-11-02 DIAGNOSIS — B961 Klebsiella pneumoniae [K. pneumoniae] as the cause of diseases classified elsewhere: Secondary | ICD-10-CM

## 2022-11-02 DIAGNOSIS — D849 Immunodeficiency, unspecified: Secondary | ICD-10-CM | POA: Diagnosis present

## 2022-11-02 DIAGNOSIS — S32000S Wedge compression fracture of unspecified lumbar vertebra, sequela: Secondary | ICD-10-CM

## 2022-11-02 DIAGNOSIS — D6481 Anemia due to antineoplastic chemotherapy: Secondary | ICD-10-CM | POA: Diagnosis present

## 2022-11-02 DIAGNOSIS — A419 Sepsis, unspecified organism: Secondary | ICD-10-CM | POA: Diagnosis present

## 2022-11-02 DIAGNOSIS — C189 Malignant neoplasm of colon, unspecified: Principal | ICD-10-CM

## 2022-11-02 DIAGNOSIS — R7881 Bacteremia: Secondary | ICD-10-CM

## 2022-11-02 DIAGNOSIS — D61810 Antineoplastic chemotherapy induced pancytopenia: Secondary | ICD-10-CM | POA: Diagnosis present

## 2022-11-02 DIAGNOSIS — G893 Neoplasm related pain (acute) (chronic): Secondary | ICD-10-CM | POA: Diagnosis present

## 2022-11-02 DIAGNOSIS — Z888 Allergy status to other drugs, medicaments and biological substances status: Secondary | ICD-10-CM

## 2022-11-02 DIAGNOSIS — Z923 Personal history of irradiation: Secondary | ICD-10-CM

## 2022-11-02 DIAGNOSIS — E860 Dehydration: Secondary | ICD-10-CM | POA: Diagnosis present

## 2022-11-02 DIAGNOSIS — Z66 Do not resuscitate: Secondary | ICD-10-CM | POA: Diagnosis present

## 2022-11-02 DIAGNOSIS — Z880 Allergy status to penicillin: Secondary | ICD-10-CM

## 2022-11-02 DIAGNOSIS — Z881 Allergy status to other antibiotic agents status: Secondary | ICD-10-CM

## 2022-11-02 DIAGNOSIS — Z85048 Personal history of other malignant neoplasm of rectum, rectosigmoid junction, and anus: Secondary | ICD-10-CM

## 2022-11-02 DIAGNOSIS — Z801 Family history of malignant neoplasm of trachea, bronchus and lung: Secondary | ICD-10-CM

## 2022-11-02 DIAGNOSIS — S32000A Wedge compression fracture of unspecified lumbar vertebra, initial encounter for closed fracture: Secondary | ICD-10-CM

## 2022-11-02 DIAGNOSIS — Z9049 Acquired absence of other specified parts of digestive tract: Secondary | ICD-10-CM

## 2022-11-02 DIAGNOSIS — L24B3 Irritant contact dermatitis related to fecal or urinary stoma or fistula: Secondary | ICD-10-CM

## 2022-11-02 DIAGNOSIS — D709 Neutropenia, unspecified: Secondary | ICD-10-CM

## 2022-11-02 DIAGNOSIS — D630 Anemia in neoplastic disease: Secondary | ICD-10-CM | POA: Diagnosis present

## 2022-11-02 DIAGNOSIS — C787 Secondary malignant neoplasm of liver and intrahepatic bile duct: Secondary | ICD-10-CM | POA: Diagnosis present

## 2022-11-02 DIAGNOSIS — D61818 Other pancytopenia: Secondary | ICD-10-CM

## 2022-11-02 DIAGNOSIS — M4856XA Collapsed vertebra, not elsewhere classified, lumbar region, initial encounter for fracture: Secondary | ICD-10-CM | POA: Diagnosis present

## 2022-11-02 DIAGNOSIS — E876 Hypokalemia: Secondary | ICD-10-CM

## 2022-11-02 DIAGNOSIS — R6520 Severe sepsis without septic shock: Secondary | ICD-10-CM | POA: Diagnosis present

## 2022-11-02 DIAGNOSIS — Z5112 Encounter for antineoplastic immunotherapy: Secondary | ICD-10-CM | POA: Diagnosis not present

## 2022-11-02 DIAGNOSIS — Z91048 Other nonmedicinal substance allergy status: Secondary | ICD-10-CM

## 2022-11-02 DIAGNOSIS — K831 Obstruction of bile duct: Secondary | ICD-10-CM | POA: Diagnosis present

## 2022-11-02 DIAGNOSIS — I493 Ventricular premature depolarization: Secondary | ICD-10-CM | POA: Diagnosis present

## 2022-11-02 DIAGNOSIS — Z79891 Long term (current) use of opiate analgesic: Secondary | ICD-10-CM

## 2022-11-02 DIAGNOSIS — A4159 Other Gram-negative sepsis: Principal | ICD-10-CM | POA: Diagnosis present

## 2022-11-02 DIAGNOSIS — T451X5A Adverse effect of antineoplastic and immunosuppressive drugs, initial encounter: Secondary | ICD-10-CM | POA: Diagnosis present

## 2022-11-02 DIAGNOSIS — E872 Acidosis, unspecified: Secondary | ICD-10-CM | POA: Diagnosis present

## 2022-11-02 LAB — LACTIC ACID, PLASMA
Lactic Acid, Venous: 2.9 mmol/L (ref 0.5–1.9)
Lactic Acid, Venous: 3.3 mmol/L (ref 0.5–1.9)
Lactic Acid, Venous: 2.4 mmol/L (ref 0.5–1.9)
Lactic Acid, Venous: 1.7 mmol/L (ref 0.5–1.9)

## 2022-11-02 LAB — CBC WITH DIFFERENTIAL/PLATELET
Abs Immature Granulocytes: 0.01 10*3/uL (ref 0.00–0.07)
Basophils Absolute: 0 10*3/uL (ref 0.0–0.1)
Basophils Relative: 0 %
Eosinophils Absolute: 0 10*3/uL (ref 0.0–0.5)
Eosinophils Relative: 0 %
HCT: 31.9 % — ABNORMAL LOW (ref 36.0–46.0)
Hemoglobin: 10 g/dL — ABNORMAL LOW (ref 12.0–15.0)
Immature Granulocytes: 1 %
Lymphocytes Relative: 42 %
Lymphs Abs: 0.3 10*3/uL — ABNORMAL LOW (ref 0.7–4.0)
MCH: 28.2 pg (ref 26.0–34.0)
MCHC: 31.3 g/dL (ref 30.0–36.0)
MCV: 90.1 fL (ref 80.0–100.0)
Monocytes Absolute: 0 10*3/uL — ABNORMAL LOW (ref 0.1–1.0)
Monocytes Relative: 1 %
Neutro Abs: 0.4 10*3/uL — CL (ref 1.7–7.7)
Neutrophils Relative %: 56 %
Platelets: 158 10*3/uL (ref 150–400)
RBC: 3.54 MIL/uL — ABNORMAL LOW (ref 3.87–5.11)
RDW: 17.2 % — ABNORMAL HIGH (ref 11.5–15.5)
Smear Review: NORMAL
WBC: 0.8 10*3/uL — CL (ref 4.0–10.5)
nRBC: 0 % (ref 0.0–0.2)

## 2022-11-02 LAB — COMPREHENSIVE METABOLIC PANEL
ALT: 29 U/L (ref 0–44)
AST: 47 U/L — ABNORMAL HIGH (ref 15–41)
Albumin: 2.8 g/dL — ABNORMAL LOW (ref 3.5–5.0)
Alkaline Phosphatase: 211 U/L — ABNORMAL HIGH (ref 38–126)
Anion gap: 10 (ref 5–15)
BUN: 10 mg/dL (ref 6–20)
CO2: 20 mmol/L — ABNORMAL LOW (ref 22–32)
Calcium: 8 mg/dL — ABNORMAL LOW (ref 8.9–10.3)
Chloride: 102 mmol/L (ref 98–111)
Creatinine, Ser: 0.8 mg/dL (ref 0.44–1.00)
GFR, Estimated: >60 mL/min (ref >60)
Glucose, Bld: 120 mg/dL — ABNORMAL HIGH (ref 70–99)
Potassium: 3.1 mmol/L — ABNORMAL LOW (ref 3.5–5.1)
Sodium: 132 mmol/L — ABNORMAL LOW (ref 135–145)
Total Bilirubin: 1.7 mg/dL — ABNORMAL HIGH (ref 0.3–1.2)
Total Protein: 7.2 g/dL (ref 6.5–8.1)

## 2022-11-02 LAB — URINALYSIS, W/ REFLEX TO CULTURE (INFECTION SUSPECTED)
Bilirubin Urine: NEGATIVE
Glucose, UA: NEGATIVE mg/dL
Hgb urine dipstick: NEGATIVE
Ketones, ur: NEGATIVE mg/dL
Leukocytes,Ua: NEGATIVE
Nitrite: NEGATIVE
Protein, ur: NEGATIVE mg/dL
RBC / HPF: NONE SEEN RBC/hpf (ref 0–5)
Specific Gravity, Urine: 1.01 (ref 1.005–1.030)
pH: 5.5 (ref 5.0–8.0)

## 2022-11-02 LAB — I-STAT VENOUS BLOOD GAS, ED
Acid-base deficit: 3 mmol/L — ABNORMAL HIGH (ref 0.0–2.0)
Bicarbonate: 21.6 mmol/L (ref 20.0–28.0)
Calcium, Ion: 1.16 mmol/L (ref 1.15–1.40)
HCT: 27 % — ABNORMAL LOW (ref 36.0–46.0)
Hemoglobin: 9.2 g/dL — ABNORMAL LOW (ref 12.0–15.0)
O2 Saturation: 29 %
Patient temperature: 99.1
Potassium: 3.3 mmol/L — ABNORMAL LOW (ref 3.5–5.1)
Sodium: 136 mmol/L (ref 135–145)
TCO2: 23 mmol/L (ref 22–32)
pCO2, Ven: 34.2 mmHg — ABNORMAL LOW (ref 44–60)
pH, Ven: 7.408 (ref 7.25–7.43)
pO2, Ven: 19 mmHg — CL (ref 32–45)

## 2022-11-02 LAB — PREGNANCY, URINE: Preg Test, Ur: NEGATIVE

## 2022-11-02 LAB — TROPONIN I (HIGH SENSITIVITY)
Troponin I (High Sensitivity): 7 ng/L (ref <18)
Troponin I (High Sensitivity): 7 ng/L (ref <18)

## 2022-11-02 LAB — PROTIME-INR
INR: 1.5 — ABNORMAL HIGH (ref 0.8–1.2)
Prothrombin Time: 18.5 seconds — ABNORMAL HIGH (ref 11.4–15.2)

## 2022-11-02 LAB — APTT: aPTT: 29 seconds (ref 24–36)

## 2022-11-02 MED ORDER — HEPARIN SOD (PORK) LOCK FLUSH 100 UNIT/ML IV SOLN
500.0000 [IU] | Freq: Once | INTRAVENOUS | Status: AC | PRN
  Administered 2022-11-02: 500 [IU]

## 2022-11-02 MED ORDER — SODIUM CHLORIDE 0.9 % IV SOLN
2.0000 g | Freq: Three times a day (TID) | INTRAVENOUS | Status: DC
  Administered 2022-11-03: 2 g via INTRAVENOUS
  Filled 2022-11-02: qty 12.5

## 2022-11-02 MED ORDER — VANCOMYCIN HCL IN DEXTROSE 1-5 GM/200ML-% IV SOLN
1000.0000 mg | Freq: Once | INTRAVENOUS | Status: AC
  Administered 2022-11-02: 1000 mg via INTRAVENOUS
  Filled 2022-11-02: qty 200

## 2022-11-02 MED ORDER — IOHEXOL 350 MG/ML SOLN
100.0000 mL | Freq: Once | INTRAVENOUS | Status: AC | PRN
  Administered 2022-11-02: 100 mL via INTRAVENOUS

## 2022-11-02 MED ORDER — ALBUTEROL SULFATE HFA 108 (90 BASE) MCG/ACT IN AERS: 2.0000 | INHALATION_SPRAY | RESPIRATORY_TRACT | Status: DC | PRN

## 2022-11-02 MED ORDER — LACTATED RINGERS IV BOLUS (SEPSIS)
2000.0000 mL | Freq: Once | INTRAVENOUS | Status: AC
  Administered 2022-11-02: 2000 mL via INTRAVENOUS

## 2022-11-02 MED ORDER — PEGFILGRASTIM-CBQV 6 MG/0.6ML ~~LOC~~ SOSY
6.0000 mg | PREFILLED_SYRINGE | Freq: Once | SUBCUTANEOUS | Status: AC
  Administered 2022-11-02: 6 mg via SUBCUTANEOUS
  Filled 2022-11-02: qty 0.6

## 2022-11-02 MED ORDER — VANCOMYCIN HCL IN DEXTROSE 1-5 GM/200ML-% IV SOLN: 1000.0000 mg | INTRAVENOUS | Status: DC

## 2022-11-02 MED ORDER — SODIUM CHLORIDE 0.9% FLUSH
10.0000 mL | INTRAVENOUS | Status: DC | PRN
  Administered 2022-11-02: 10 mL

## 2022-11-02 MED ORDER — FENTANYL CITRATE PF 50 MCG/ML IJ SOSY: 50.0000 ug | PREFILLED_SYRINGE | Freq: Once | INTRAMUSCULAR | Status: DC

## 2022-11-02 MED ORDER — MORPHINE SULFATE (PF) 4 MG/ML IV SOLN
4.0000 mg | Freq: Once | INTRAVENOUS | Status: AC
  Administered 2022-11-02: 4 mg via INTRAVENOUS
  Filled 2022-11-02: qty 1

## 2022-11-02 MED ORDER — SODIUM CHLORIDE 0.9 % IV BOLUS
500.0000 mL | Freq: Once | INTRAVENOUS | Status: AC
  Administered 2022-11-02: 500 mL via INTRAVENOUS

## 2022-11-02 MED ORDER — SODIUM CHLORIDE 0.9 % IV SOLN
2.0000 g | Freq: Once | INTRAVENOUS | Status: AC
  Administered 2022-11-02: 2 g via INTRAVENOUS
  Filled 2022-11-02: qty 12.5

## 2022-11-02 MED ORDER — HYDROMORPHONE HCL 1 MG/ML IJ SOLN
1.0000 mg | INTRAMUSCULAR | Status: DC | PRN
  Administered 2022-11-02 (×2): 2 mg via INTRAVENOUS
  Administered 2022-11-03: 1 mg via INTRAVENOUS
  Administered 2022-11-03 (×3): 2 mg via INTRAVENOUS
  Administered 2022-11-03 (×2): 1 mg via INTRAVENOUS
  Administered 2022-11-03: 2 mg via INTRAVENOUS
  Administered 2022-11-03: 1 mg via INTRAVENOUS
  Administered 2022-11-03 – 2022-11-04 (×9): 2 mg via INTRAVENOUS
  Filled 2022-11-02 (×4): qty 2
  Filled 2022-11-02: qty 1
  Filled 2022-11-02 (×6): qty 2
  Filled 2022-11-02 (×2): qty 1
  Filled 2022-11-02: qty 2
  Filled 2022-11-02: qty 1
  Filled 2022-11-02 (×4): qty 2

## 2022-11-02 NOTE — Progress Notes (Signed)
Pharmacy Antibiotic Note  Rebecca Bullock is a 57 y.o. female admitted on 11/02/2022 with sepsis.  Pharmacy has been consulted for vancomycin and cefepime dosing. Pt is afebrile. Scr is elevated above baseline. Of note, patient has a history of red man syndrome with vancomycin. Will slow the infusion down to give over 2 hours to see if she can tolerate it.   Plan: Vanc 1g IV Q24H each dose over 2 hours   Cefepime 2g IV Q8H F/u renal fxn, C&S, clinical status and peak/trough at SS  Height: 5\' 1"  (154.9 cm) Weight: 73.6 kg (162 lb 4.1 oz) IBW/kg (Calculated) : 47.8  Temp (24hrs), Avg:98.8 F (37.1 C), Min:98.5 F (36.9 C), Max:99.1 F (37.3 C)  Recent Labs  Lab 10/31/22 0815 11/02/22 1438  WBC 2.0* PENDING  CREATININE 0.47 0.80    Estimated Creatinine Clearance: 72.9 mL/min (by C-G formula based on SCr of 0.8 mg/dL).    Allergies  Allergen Reactions   Augmentin [Amoxicillin-Pot Clavulanate] Diarrhea and Other (See Comments)    Extreme diarrhea, abdominal pain.   Irinotecan Other (See Comments)    Flushing, tingling, visual disturbance   Metronidazole Diarrhea and Other (See Comments)   Vancomycin Rash and Other (See Comments)    "Red Man Syndrome"   Latex Rash   Tape Rash   Wound Dressing Adhesive Rash    Antimicrobials this admission: Vanc 6/7>> Cefepime 6/7>>   Dose adjustments this admission: N/A  Microbiology results: Pending  Thank you for allowing pharmacy to be a part of this patient's care.  Rebecca Bullock, Drake Leach 11/02/2022 3:08 PM

## 2022-11-02 NOTE — ED Provider Notes (Signed)
[facility] Provider Note   CSN: [id number] Arrival date & time: 11/02/22  1246     History  Chief Complaint  Patient presents with   Shortness of Breath    Rebecca Bullock is a 57 y.o. female.  Patient is a 57 year old female with a past medical history of colon cancer status post colectomy with ostomy in place and on chemotherapy presenting to the emergency department with fever and increased drowsiness.  Patient is here with her family member states that she is on scheduled pain medicines every 2-4 hours.  He states that normally she wakes up in the night to take her pain medication but slept well throughout the night last night.  He states that when she woke up this morning her daughter checked her temperature with a forehead thermometer and her temperature was 103.  She states that they spoke with her oncology nurse who recommended that she come to the emergency department.  She states that she has been feeling short of breath with some right-sided mid back pain.  She denies any cough.  She denies any nausea or vomiting.  She states that she has had normal output from her ostomy.  She denies any abdominal pain.  She denies any dysuria or hematuria.  Her family member reports that she has just been significantly fatigued and drowsy compared to usual today.  The history is provided by the patient and a relative.  Shortness of Breath      Home Medications Prior to Admission medications   Medication Sig Start Date End Date Taking? Authorizing Provider  alum & mag hydroxide-simeth suspension-nystatin suspension-diphenhydrAMINE liquid Take 5 mLs by mouth 4 (four) times daily as needed for mouth pain. 10/23/22  Yes Josph Macho, MD  baclofen (LIORESAL) 10 MG tablet Take 1 tablet (10 mg total) by mouth 3 (three) times daily. Patient taking differently: Take 10 mg by mouth as needed for muscle spasms. 08/28/22  Yes Ennever, Rose Phi, MD   dexamethasone (DECADRON) 4 MG tablet Take 2 tablets (8 mg total) by mouth daily. Start the day after chemotherapy for 2 days. Take with food. 10/17/22  Yes Josph Macho, MD  famotidine (PEPCID) 40 MG tablet Take 1 tablet (40 mg total) by mouth 2 (two) times daily. 08/07/22  Yes Ennever, Rose Phi, MD  HYDROmorphone (DILAUDID) 4 MG tablet Take 1 tablet (4 mg total) by mouth every 2 (two) hours as needed for severe pain. 10/08/22  Yes Hollice Espy, MD  LORazepam (ATIVAN) 0.5 MG tablet Place 1 tablet (0.5 mg total) under the tongue every 4 (four) hours as needed for anxiety. 09/21/22  Yes Josph Macho, MD  megestrol (MEGACE) 400 MG/10ML suspension Take 10 mLs (400 mg total) by mouth 2 (two) times daily. 09/21/22  Yes Ennever, Rose Phi, MD  meloxicam (MOBIC) 7.5 MG tablet Take 1 tablet (7.5 mg total) by mouth 2 (two) times daily. 11/01/22  Yes Pickenpack-Cousar, Arty Baumgartner, NP  morphine (MS CONTIN) 30 MG 12 hr tablet Take 1 tablet (30 mg total) by mouth every 8 (eight) hours. 10/19/22  Yes Pickenpack-Cousar, Arty Baumgartner, NP  OLANZapine (ZYPREXA) 10 MG tablet Take 1 tablet (10 mg total) by mouth at bedtime. 09/21/22  Yes Ennever, Rose Phi, MD  ondansetron (ZOFRAN) 4 MG tablet Take 1 tablet (4 mg total) by mouth every 6 (six) hours. 10/08/22  Yes Hollice Espy, MD  scopolamine (TRANSDERM-SCOP) 1 MG/3DAYS Place one patch behind the ear  every three days. 09/10/22  Yes Josph Macho, MD  Sodium Chloride Flush (NORMAL SALINE FLUSH) 0.9 % SOLN Flush abdominal drain with 5 cc sterile saline once daily 10/05/22  Yes Allred, Darrell K, PA-C      Allergies    Augmentin [amoxicillin-pot clavulanate], Irinotecan, Metronidazole, Vancomycin, Latex, Tape, and Wound dressing adhesive    Review of Systems   Review of Systems  Respiratory:  Positive for shortness of breath.     Physical Exam Updated Vital Signs BP (!) 90/54   Pulse (!) 111   Temp 99.1 F (37.3 C) (Oral)   Resp 19   Ht 5\' 1"  (1.549 m)   Wt  73.6 kg   SpO2 99%   BMI 30.66 kg/m  Physical Exam Vitals and nursing note reviewed.  Constitutional:      General: She is not in acute distress.    Appearance: She is ill-appearing.  HENT:     Head: Normocephalic and atraumatic.     Mouth/Throat:     Mouth: Mucous membranes are moist.     Pharynx: Oropharynx is clear.  Eyes:     Extraocular Movements: Extraocular movements intact.  Cardiovascular:     Rate and Rhythm: Regular rhythm. Tachycardia present.     Pulses: Normal pulses.     Heart sounds: Normal heart sounds.  Pulmonary:     Effort: Pulmonary effort is normal.     Breath sounds: Normal breath sounds.  Chest:     Chest wall: No tenderness.  Abdominal:     Palpations: Abdomen is soft.     Tenderness: There is no abdominal tenderness.     Comments: No CVA tenderness bilaterally  Musculoskeletal:        General: Normal range of motion.     Cervical back: Normal range of motion and neck supple.     Comments: No midline back tenderness, no paraspinal muscle tenderness to palpation, no overlying skin changes  Skin:    General: Skin is warm and dry.  Neurological:     General: No focal deficit present.     Mental Status: She is alert and oriented to person, place, and time.  Psychiatric:        Mood and Affect: Mood normal.        Behavior: Behavior normal.     ED Results / Procedures / Treatments   Labs (all labs ordered are listed, but only abnormal results are displayed) Labs Reviewed  COMPREHENSIVE METABOLIC PANEL - Abnormal; Notable for the following components:      Result Value   Sodium 132 (*)    Potassium 3.1 (*)    CO2 20 (*)    Glucose, Bld 120 (*)    Calcium 8.0 (*)    Albumin 2.8 (*)    AST 47 (*)    Alkaline Phosphatase 211 (*)    Total Bilirubin 1.7 (*)    All other components within normal limits  I-STAT VENOUS BLOOD GAS, ED - Abnormal; Notable for the following components:   pCO2, Ven 34.2 (*)    pO2, Ven 19 (*)    Acid-base deficit  3.0 (*)    Potassium 3.3 (*)    HCT 27.0 (*)    Hemoglobin 9.2 (*)    All other components within normal limits  CULTURE, BLOOD (ROUTINE X 2)  CULTURE, BLOOD (ROUTINE X 2)  CBC WITH DIFFERENTIAL/PLATELET  LACTIC ACID, PLASMA  LACTIC ACID, PLASMA  PROTIME-INR  APTT  PREGNANCY, URINE  URINALYSIS, W/  REFLEX TO CULTURE (INFECTION SUSPECTED)  TROPONIN I (HIGH SENSITIVITY)    EKG None  Radiology DG Chest 2 View  Result Date: 11/02/2022 CLINICAL DATA:  Shortness of breath. History of metastatic colon cancer. EXAM: CHEST - 2 VIEW COMPARISON:  08/18/2022 FINDINGS: No consolidation, pneumothorax or effusion. No edema. Normal cardiopericardial silhouette. Degenerative changes of the spine on lateral view. Stable right upper chest port with tip along the central SVC above the right atrium. Separate catheter in the upper abdomen. IMPRESSION: No acute cardiopulmonary disease.  Chest port Electronically Signed   By: Karen Kays M.D.   On: 11/02/2022 14:24    Procedures .Critical Care  Performed by: Rexford Maus, DO Authorized by: Rexford Maus, DO   Critical care provider statement:    Critical care time (minutes):  40   Critical care was necessary to treat or prevent imminent or life-threatening deterioration of the following conditions:  Sepsis   Critical care was time spent personally by me on the following activities:  Development of treatment plan with patient or surrogate, evaluation of patient's response to treatment, examination of patient, obtaining history from patient or surrogate, ordering and performing treatments and interventions, ordering and review of laboratory studies, ordering and review of radiographic studies, review of old charts, re-evaluation of patient's condition and pulse oximetry   I assumed direction of critical care for this patient from another provider in my specialty: no       Medications Ordered in ED Medications  albuterol (VENTOLIN HFA) 108  (90 Base) MCG/ACT inhaler 2 puff (has no administration in time range)  lactated ringers bolus 2,000 mL (2,000 mLs Intravenous New Bag/Given 11/02/22 1440)  ceFEPIme (MAXIPIME) 2 g in sodium chloride 0.9 % 100 mL IVPB (2 g Intravenous New Bag/Given 11/02/22 1534)  vancomycin (VANCOCIN) IVPB 1000 mg/200 mL premix (has no administration in time range)  vancomycin (VANCOCIN) IVPB 1000 mg/200 mL premix (has no administration in time range)  ceFEPIme (MAXIPIME) 2 g in sodium chloride 0.9 % 100 mL IVPB (has no administration in time range)  morphine (PF) 4 MG/ML injection 4 mg (4 mg Intravenous Given 11/02/22 1445)    ED Course/ Medical Decision Making/ A&P Clinical Course as of 11/02/22 1536  Fri Nov 02, 2022  1501 Patient hypotensive on repeat vitals. She was started on IVF. In the setting of immunocompromised state will be started on antibiotics for concern for sepsis. [VK]  1524 Watcher: 61 YOF with  sepsis UO. HX of CC on active therapy AMS and fever this AM. Endorses Dyspnea/Back/Flank pain Hx RMS [CC]    Clinical Course User Index [CC] Glyn Ade, MD [VK] Rexford Maus, DO                             Medical Decision Making This patient presents to the ED with chief complaint(s) of fever, drowsiness with pertinent past medical history of colon cancer s/p colectomy on chemotherapy which further complicates the presenting complaint. The complaint involves an extensive differential diagnosis and also carries with it a high risk of complications and morbidity.    The differential diagnosis includes sepsis, neutropenic fever, pneumonia, pneumothorax, pulmonary edema, pleural effusion, ACS, arrhythmia, considering PE as a cause of her low-grade fever with shortness of breath and tachycardia, dehydration, electrolyte abnormality, medication side effect  Additional history obtained: Additional history obtained from family Records reviewed outpatient oncology records  ED Course and  Reassessment: On patient's arrival  to the emergency department she had low-grade fever and was significantly tachycardic to the 140s and ill-appearing.  Patient an EKG on arrival that showed sinus tachycardia without acute ischemic changes.  The patient is awake and alert here and does not appear to be acutely opioid intoxicated.  The patient will undergo sepsis workup as well as a CT PE study as a possible cause of her symptoms.  She will be started on IV fluids and pain control and will be closely reassessed.  Independent labs interpretation:  The following labs were independently interpreted: pending  Independent visualization of imaging: - I independently visualized the following imaging with scope of interpretation limited to determining acute life threatening conditions related to emergency care: CXR, which revealed no acute disease      Amount and/or Complexity of Data Reviewed Labs: ordered. Radiology: ordered.  Risk Prescription drug management.          Final Clinical Impression(s) / ED Diagnoses Final diagnoses:  None    Rx / DC Orders ED Discharge Orders     None         Rexford Maus, DO 11/02/22 1536

## 2022-11-02 NOTE — Discharge Instructions (Signed)
Update [place] order add adhesive remover

## 2022-11-02 NOTE — ED Notes (Signed)
Per EDP Countryman, pt may eat. Pt and pts family made aware.

## 2022-11-02 NOTE — ED Notes (Signed)
Pt transported to CT ?

## 2022-11-02 NOTE — ED Notes (Signed)
Pt still unable to provide urine sample. Aware that a specimen is needed.

## 2022-11-02 NOTE — Telephone Encounter (Signed)
Call received from patient's daughter, Darl Pikes stating that since getting home today from getting Udenyca injection pt is now SOB, and shaking with a fever of 100.3.  Darl Pikes instructed to call 911 now.  Darl Pikes states that she will call 911 now for pt.

## 2022-11-02 NOTE — ED Triage Notes (Signed)
States she is sob and is sleepy , has had baclophen , dilaudid  q 2 hours and mobic and morphine  ,  3 x a day  per husband she had  2 day infusion pump removed  upstairs today and then went back to sleep after she got home

## 2022-11-02 NOTE — ED Notes (Signed)
EDP Delo made aware of pts bp of 85/53.

## 2022-11-02 NOTE — ED Notes (Addendum)
2nd set of BC attempted to be collected, unsuccessful.  IV antibiotics started to prevent delay. EDP Countryman made aware.

## 2022-11-02 NOTE — ED Notes (Signed)
[facility] called for transport. 

## 2022-11-02 NOTE — Patient Instructions (Signed)

## 2022-11-02 NOTE — ED Provider Notes (Signed)
Care of patient received from prior provider at 11:07 PM, please see their note for complete H/P and care plan.  Received handoff per ED course.  Clinical Course as of 11/02/22 2307  Fri Nov 02, 2022  1501 Patient hypotensive on repeat vitals. She was started on IVF. In the setting of immunocompromised state will be started on antibiotics for concern for sepsis. [VK]  1524 Watcher: 56 YOF with  sepsis UO. HX of CC on active therapy AMS and fever this AM. Endorses Dyspnea/Back/Flank pain Hx RMS [CC]    Clinical Course User Index [CC] Glyn Ade, MD [VK] Rexford Maus, DO   CRITICAL CARE Performed by: Glyn Ade   Total critical care time: 50 minutes  Critical care time was exclusive of separately billable procedures and treating other patients.  Critical care was necessary to treat or prevent imminent or life-threatening deterioration.  Critical care was time spent personally by me on the following activities: development of treatment plan with patient and/or surrogate as well as nursing, discussions with consultants, evaluation of patient's response to treatment, examination of patient, obtaining history from patient or surrogate, ordering and performing treatments and interventions, ordering and review of laboratory studies, ordering and review of radiographic studies, pulse oximetry and re-evaluation of patient's condition.  Reassessment: Patient extensively ill.  Required multiple titrations of narcotic pain medication, extensive diagnostic evaluation.  Advanced sepsis with concern for line infection.  Consulted hospitalist agrees with need for advanced care and management.  Patient arranged for admission frequently reassessed in the emergency room.  Disposition:   Based on the above findings, I believe this patient is stable for admission.    Patient/family educated about specific findings on our evaluation and explained exact reasons for admission.   Patient/family educated about clinical situation and time was allowed to answer questions.   Admission team communicated with and agreed with need for admission. Patient admitted. Patient ready to move at this time.     Emergency Department Medication Summary:   Medications  albuterol (VENTOLIN HFA) 108 (90 Base) MCG/ACT inhaler 2 puff (has no administration in time range)  vancomycin (VANCOCIN) IVPB 1000 mg/200 mL premix (has no administration in time range)  ceFEPIme (MAXIPIME) 2 g in sodium chloride 0.9 % 100 mL IVPB (has no administration in time range)  fentaNYL (SUBLIMAZE) injection 50 mcg (50 mcg Intravenous Not Given 11/02/22 1848)  HYDROmorphone (DILAUDID) injection 1-2 mg (2 mg Intravenous Given 11/02/22 2222)  lactated ringers bolus 2,000 mL (0 mLs Intravenous Stopped 11/02/22 1712)  morphine (PF) 4 MG/ML injection 4 mg (4 mg Intravenous Given 11/02/22 1445)  ceFEPIme (MAXIPIME) 2 g in sodium chloride 0.9 % 100 mL IVPB (0 g Intravenous Stopped 11/02/22 1651)  vancomycin (VANCOCIN) IVPB 1000 mg/200 mL premix (0 mg Intravenous Stopped 11/02/22 1848)  iohexol (OMNIPAQUE) 350 MG/ML injection 100 mL (100 mLs Intravenous Contrast Given 11/02/22 1726)            Glyn Ade, MD 11/02/22 2307

## 2022-11-03 ENCOUNTER — Other Ambulatory Visit: Payer: Self-pay

## 2022-11-03 DIAGNOSIS — R6520 Severe sepsis without septic shock: Secondary | ICD-10-CM

## 2022-11-03 DIAGNOSIS — C189 Malignant neoplasm of colon, unspecified: Secondary | ICD-10-CM

## 2022-11-03 DIAGNOSIS — R7881 Bacteremia: Secondary | ICD-10-CM | POA: Diagnosis not present

## 2022-11-03 DIAGNOSIS — I493 Ventricular premature depolarization: Secondary | ICD-10-CM | POA: Diagnosis present

## 2022-11-03 DIAGNOSIS — C19 Malignant neoplasm of rectosigmoid junction: Secondary | ICD-10-CM | POA: Diagnosis present

## 2022-11-03 DIAGNOSIS — D6481 Anemia due to antineoplastic chemotherapy: Secondary | ICD-10-CM | POA: Diagnosis present

## 2022-11-03 DIAGNOSIS — Z933 Colostomy status: Secondary | ICD-10-CM | POA: Diagnosis not present

## 2022-11-03 DIAGNOSIS — M4856XA Collapsed vertebra, not elsewhere classified, lumbar region, initial encounter for fracture: Secondary | ICD-10-CM | POA: Diagnosis present

## 2022-11-03 DIAGNOSIS — G893 Neoplasm related pain (acute) (chronic): Secondary | ICD-10-CM | POA: Diagnosis present

## 2022-11-03 DIAGNOSIS — B961 Klebsiella pneumoniae [K. pneumoniae] as the cause of diseases classified elsewhere: Secondary | ICD-10-CM

## 2022-11-03 DIAGNOSIS — E876 Hypokalemia: Secondary | ICD-10-CM | POA: Diagnosis present

## 2022-11-03 DIAGNOSIS — D701 Agranulocytosis secondary to cancer chemotherapy: Secondary | ICD-10-CM | POA: Diagnosis not present

## 2022-11-03 DIAGNOSIS — A419 Sepsis, unspecified organism: Secondary | ICD-10-CM | POA: Diagnosis not present

## 2022-11-03 DIAGNOSIS — D709 Neutropenia, unspecified: Secondary | ICD-10-CM | POA: Diagnosis not present

## 2022-11-03 DIAGNOSIS — A4159 Other Gram-negative sepsis: Secondary | ICD-10-CM | POA: Diagnosis present

## 2022-11-03 DIAGNOSIS — T451X5A Adverse effect of antineoplastic and immunosuppressive drugs, initial encounter: Secondary | ICD-10-CM | POA: Diagnosis present

## 2022-11-03 DIAGNOSIS — K831 Obstruction of bile duct: Secondary | ICD-10-CM | POA: Diagnosis present

## 2022-11-03 DIAGNOSIS — D849 Immunodeficiency, unspecified: Secondary | ICD-10-CM | POA: Diagnosis present

## 2022-11-03 DIAGNOSIS — Z79891 Long term (current) use of opiate analgesic: Secondary | ICD-10-CM | POA: Diagnosis not present

## 2022-11-03 DIAGNOSIS — Z801 Family history of malignant neoplasm of trachea, bronchus and lung: Secondary | ICD-10-CM | POA: Diagnosis not present

## 2022-11-03 DIAGNOSIS — Z923 Personal history of irradiation: Secondary | ICD-10-CM | POA: Diagnosis not present

## 2022-11-03 DIAGNOSIS — Z66 Do not resuscitate: Secondary | ICD-10-CM | POA: Diagnosis present

## 2022-11-03 DIAGNOSIS — Z85048 Personal history of other malignant neoplasm of rectum, rectosigmoid junction, and anus: Secondary | ICD-10-CM | POA: Diagnosis not present

## 2022-11-03 DIAGNOSIS — C787 Secondary malignant neoplasm of liver and intrahepatic bile duct: Secondary | ICD-10-CM

## 2022-11-03 DIAGNOSIS — E872 Acidosis, unspecified: Secondary | ICD-10-CM | POA: Diagnosis present

## 2022-11-03 DIAGNOSIS — D630 Anemia in neoplastic disease: Secondary | ICD-10-CM | POA: Diagnosis present

## 2022-11-03 DIAGNOSIS — D61810 Antineoplastic chemotherapy induced pancytopenia: Secondary | ICD-10-CM | POA: Diagnosis present

## 2022-11-03 DIAGNOSIS — E860 Dehydration: Secondary | ICD-10-CM | POA: Diagnosis present

## 2022-11-03 LAB — BLOOD CULTURE ID PANEL (REFLEXED) - BCID2

## 2022-11-03 LAB — PHOSPHORUS: Phosphorus: 2.5 mg/dL (ref 2.5–4.6)

## 2022-11-03 LAB — TSH: TSH: 1.463 u[IU]/mL (ref 0.350–4.500)

## 2022-11-03 LAB — MAGNESIUM: Magnesium: 1.4 mg/dL — ABNORMAL LOW (ref 1.7–2.4)

## 2022-11-03 LAB — CULTURE, BLOOD (ROUTINE X 2)

## 2022-11-03 LAB — MRSA NEXT GEN BY PCR, NASAL: MRSA by PCR Next Gen: NOT DETECTED

## 2022-11-03 MED ORDER — SODIUM CHLORIDE 0.9 % IV SOLN: 2.0000 g | Freq: Three times a day (TID) | INTRAVENOUS | Status: DC

## 2022-11-03 MED ORDER — CHLORHEXIDINE GLUCONATE CLOTH 2 % EX PADS
6.0000 | MEDICATED_PAD | Freq: Every day | CUTANEOUS | Status: DC
  Administered 2022-11-03 – 2022-11-08 (×6): 6 via TOPICAL

## 2022-11-03 MED ORDER — HEPARIN SODIUM (PORCINE) 5000 UNIT/ML IJ SOLN
5000.0000 [IU] | Freq: Three times a day (TID) | INTRAMUSCULAR | Status: DC
  Administered 2022-11-03 – 2022-11-04 (×5): 5000 [IU] via SUBCUTANEOUS
  Filled 2022-11-03 (×6): qty 1

## 2022-11-03 MED ORDER — SODIUM CHLORIDE 0.9 % IV SOLN: INTRAVENOUS | Status: DC

## 2022-11-03 MED ORDER — SODIUM CHLORIDE 0.9 % IV BOLUS
500.0000 mL | Freq: Once | INTRAVENOUS | Status: AC
  Administered 2022-11-03: 500 mL via INTRAVENOUS

## 2022-11-03 MED ORDER — SODIUM CHLORIDE 0.9 % IV SOLN
2.0000 g | INTRAVENOUS | Status: DC
  Administered 2022-11-03 – 2022-11-06 (×4): 2 g via INTRAVENOUS
  Filled 2022-11-03 (×4): qty 20

## 2022-11-03 MED ORDER — VANCOMYCIN HCL IN DEXTROSE 1-5 GM/200ML-% IV SOLN
1000.0000 mg | Freq: Once | INTRAVENOUS | Status: DC
Start: 2022-11-03 — End: 2022-11-03

## 2022-11-03 MED ORDER — SODIUM CHLORIDE 0.9 % IV SOLN
2.0000 g | Freq: Once | INTRAVENOUS | Status: DC
Start: 2022-11-03 — End: 2022-11-03

## 2022-11-03 MED ORDER — ONDANSETRON HCL 4 MG/2ML IJ SOLN
4.0000 mg | Freq: Four times a day (QID) | INTRAMUSCULAR | Status: DC | PRN
  Filled 2022-11-03: qty 2

## 2022-11-03 MED ORDER — ALBUMIN HUMAN 25 % IV SOLN
12.5000 g | Freq: Once | INTRAVENOUS | Status: AC
  Administered 2022-11-03: 12.5 g via INTRAVENOUS
  Filled 2022-11-03: qty 50

## 2022-11-03 MED ORDER — MAGNESIUM SULFATE 2 GM/50ML IV SOLN
2.0000 g | Freq: Once | INTRAVENOUS | Status: AC
  Administered 2022-11-03: 2 g via INTRAVENOUS
  Filled 2022-11-03: qty 50

## 2022-11-03 MED ORDER — TRAMADOL HCL 50 MG PO TABS
50.0000 mg | ORAL_TABLET | Freq: Three times a day (TID) | ORAL | Status: DC | PRN
  Administered 2022-11-03 (×2): 50 mg via ORAL
  Filled 2022-11-03 (×2): qty 1

## 2022-11-03 MED ORDER — ONDANSETRON HCL 4 MG PO TABS: 4.0000 mg | ORAL_TABLET | Freq: Four times a day (QID) | ORAL | Status: DC | PRN

## 2022-11-03 MED ORDER — SODIUM CHLORIDE 0.9 % IV SOLN
1.0000 g | Freq: Three times a day (TID) | INTRAVENOUS | Status: DC
  Administered 2022-11-03: 1 g via INTRAVENOUS
  Filled 2022-11-03: qty 20

## 2022-11-03 MED ORDER — LACTATED RINGERS IV SOLN: 150.0000 mL/h | INTRAVENOUS | Status: DC

## 2022-11-03 MED ORDER — ALBUTEROL SULFATE (2.5 MG/3ML) 0.083% IN NEBU: 2.5000 mg | INHALATION_SOLUTION | RESPIRATORY_TRACT | Status: DC | PRN

## 2022-11-03 MED ORDER — ORAL CARE MOUTH RINSE: 15.0000 mL | OROMUCOSAL | Status: DC | PRN

## 2022-11-03 MED ORDER — TBO-FILGRASTIM 480 MCG/0.8ML ~~LOC~~ SOSY
480.0000 ug | PREFILLED_SYRINGE | Freq: Every day | SUBCUTANEOUS | Status: DC
  Administered 2022-11-03 – 2022-11-07 (×5): 480 ug via SUBCUTANEOUS
  Filled 2022-11-03 (×6): qty 0.8

## 2022-11-03 MED ORDER — METRONIDAZOLE 500 MG/100ML IV SOLN: 500.0000 mg | Freq: Two times a day (BID) | INTRAVENOUS | Status: DC

## 2022-11-03 NOTE — Assessment & Plan Note (Signed)
-   ANC < 500 on admission - continue neutropenic precautions - continue granix - continue treatment of Klebsiella  -Will discuss goal ANC with oncology for discharge purposes

## 2022-11-03 NOTE — ED Notes (Signed)
[facility] at bedside 

## 2022-11-03 NOTE — Assessment & Plan Note (Signed)
-   replete as needed 

## 2022-11-03 NOTE — Progress Notes (Signed)
PHARMACY - PHYSICIAN COMMUNICATION CRITICAL VALUE ALERT - BLOOD CULTURE IDENTIFICATION (BCID)  Rebecca Bullock is an 57 y.o. female who presented to [facility] on 11/02/2022 with a chief complaint of shortness of breath. Patient recently received chemotherapy and Udenyca. Reports fever PTA.   Assessment:  6/7 Bcx: 2/2 kleb pneumo, no resistance (unable to obtain second set of bcx prior to abx admin) 6/8 Bcx: pending  Name of physician (or Provider) Contacted: Dr. Frederick Peers  Current antibiotics: vancomycin, cefepime  Changes to prescribed antibiotics recommended: ceftriaxone 2 g IV q24h  Results for orders placed or performed during the hospital encounter of 11/02/22  Blood Culture ID Panel (Reflexed) (Collected: 11/02/2022  2:37 PM)  Result Value Ref Range   Enterococcus faecalis NOT DETECTED NOT DETECTED   Enterococcus Faecium NOT DETECTED NOT DETECTED   Listeria monocytogenes NOT DETECTED NOT DETECTED   Staphylococcus species NOT DETECTED NOT DETECTED   Staphylococcus aureus (BCID) NOT DETECTED NOT DETECTED   Staphylococcus epidermidis NOT DETECTED NOT DETECTED   Staphylococcus lugdunensis NOT DETECTED NOT DETECTED   Streptococcus species NOT DETECTED NOT DETECTED   Streptococcus agalactiae NOT DETECTED NOT DETECTED   Streptococcus pneumoniae NOT DETECTED NOT DETECTED   Streptococcus pyogenes NOT DETECTED NOT DETECTED   A.calcoaceticus-baumannii NOT DETECTED NOT DETECTED   Bacteroides fragilis NOT DETECTED NOT DETECTED   Enterobacterales DETECTED (A) NOT DETECTED   Enterobacter cloacae complex NOT DETECTED NOT DETECTED   Escherichia coli NOT DETECTED NOT DETECTED   Klebsiella aerogenes NOT DETECTED NOT DETECTED   Klebsiella oxytoca NOT DETECTED NOT DETECTED   Klebsiella pneumoniae DETECTED (A) NOT DETECTED   Proteus species NOT DETECTED NOT DETECTED   Salmonella species NOT DETECTED NOT DETECTED   Serratia marcescens NOT DETECTED NOT DETECTED   Haemophilus influenzae NOT DETECTED  NOT DETECTED   Neisseria meningitidis NOT DETECTED NOT DETECTED   Pseudomonas aeruginosa NOT DETECTED NOT DETECTED   Stenotrophomonas maltophilia NOT DETECTED NOT DETECTED   Candida albicans NOT DETECTED NOT DETECTED   Candida auris NOT DETECTED NOT DETECTED   Candida glabrata NOT DETECTED NOT DETECTED   Candida krusei NOT DETECTED NOT DETECTED   Candida parapsilosis NOT DETECTED NOT DETECTED   Candida tropicalis NOT DETECTED NOT DETECTED   Cryptococcus neoformans/gattii NOT DETECTED NOT DETECTED   CTX-M ESBL NOT DETECTED NOT DETECTED   Carbapenem resistance IMP NOT DETECTED NOT DETECTED   Carbapenem resistance KPC NOT DETECTED NOT DETECTED   Carbapenem resistance NDM NOT DETECTED NOT DETECTED   Carbapenem resist OXA 48 LIKE NOT DETECTED NOT DETECTED   Carbapenem resistance VIM NOT DETECTED NOT DETECTED    Pricilla Riffle, PharmD, BCPS Clinical Pharmacist 11/03/2022 9:25 AM

## 2022-11-03 NOTE — Assessment & Plan Note (Addendum)
-   on bevacizumab, irinotecan, fluorouracil; recent treatment 10/31/22 - nadir for fluorouracil 9-14 days  - follows with Dr. Myna Hidalgo

## 2022-11-03 NOTE — H&P (Signed)
History and Physical    Rebecca Bullock ZOX:[id number] DOB: 08-17-1965 DOA: 11/02/2022  PCP: Jomarie Longs, PA-C  Patient coming from: [facility]  I have personally briefly reviewed patient's old medical records in [facility]  Chief Complaint:  acute onset of fever/chills/sob  HPI: Rebecca Bullock is a 57 y.o. female with medical history significant of metastatic colon cancer with ongoing chemotherapy and chronic opioid use, who presents  with confusion ,fever/chills s/p waking up today after returning from havingUdenyca injection.  Patient notes no ha/n/v/increase ostomy output, chest pain or sob. She however noted increase in her cancer pain due to being of her 2 hour regimen.   ED Course: T 99.1, bp 110/6-86/61, hr 146 sat96% on ra cxrNAD EKG: sinus tachycardia PVC, Na 132, K 3.1, bicarb20, gly 120, alkphos 211 Inr 1.5 Wbc 0.8, hgb10.0, pmn 0.4 Lactic acid 2.9,3,3  1.7 CE 7,7 Pn 7.4/34.2 MRSA-neg CTPE UA neg MPRESSION: 1. No evidence of pulmonary embolism. 2. Mild lingular and posterior right basilar atelectasis. 3. Large hepatic mass, consistent with metastatic disease. 4. Cholelithiasis. 5. Percutaneous biliary drainage catheter in place. 6. Findings consistent with osseous metastatic disease involving the L1 vertebral body. 7. 7 mm left thyroid nodule. No follow-up imaging is recommended. Tx LR 2L, morphine 4mg  ,cefepime,vanc, fentanyl  CTAB MPRESSION: 1. New percutaneous biliary drainage catheter with decreased dilation of the intrahepatic bile ducts. 2. Unchanged size of the large hepatic dome metastasis. Similar porta hepatis lymphadenopathy. 3. Unchanged wall thickening of the cecum and right colon compatible with known primary colon cancer. 4. Cholelithiasis and gallbladder wall thickening similar to prior 5. Unchanged sclerotic lesion in the L1 vertebral body with pathologic superior endplate compression fracture. 6. Small volume free fluid in the  pelvis. 7. Mild splenomegaly. 8. Indeterminate pulmonary nodules measuring up to 3 mm are similar to prior. Review of Systems: As per HPI otherwise 10 point review of systems negative.   Past Medical History:  Diagnosis Date   Colon cancer metastasized to liver (HCC) 05/05/2021   Family history of lung cancer    Family history of pancreatic cancer    Goals of care, counseling/discussion 05/05/2021   History of radiation therapy    Lumbar Spine- 07/12/22-07/25/22- Dr. Antony Blackbird    Past Surgical History:  Procedure Laterality Date   IR 3D INDEPENDENT WKST  03/15/2022   IR ANGIOGRAM SELECTIVE EACH ADDITIONAL VESSEL  03/15/2022   IR ANGIOGRAM SELECTIVE EACH ADDITIONAL VESSEL  03/15/2022   IR ANGIOGRAM SELECTIVE EACH ADDITIONAL VESSEL  03/15/2022   IR ANGIOGRAM SELECTIVE EACH ADDITIONAL VESSEL  03/29/2022   IR ANGIOGRAM SELECTIVE EACH ADDITIONAL VESSEL  03/29/2022   IR ANGIOGRAM VISCERAL SELECTIVE  03/15/2022   IR ANGIOGRAM VISCERAL SELECTIVE  03/29/2022   IR EMBO ARTERIAL NOT HEMORR HEMANG INC GUIDE ROADMAPPING  03/15/2022   IR EMBO TUMOR ORGAN ISCHEMIA INFARCT INC GUIDE ROADMAPPING  03/29/2022   IR IMAGING GUIDED PORT INSERTION  05/11/2021   IR INT EXT BILIARY DRAIN WITH CHOLANGIOGRAM  10/03/2022   IR RADIOLOGIST EVAL & MGMT  02/15/2022   IR RADIOLOGIST EVAL & MGMT  04/23/2022   IR RADIOLOGIST EVAL & MGMT  06/25/2022   IR US GUIDE VASC ACCESS RIGHT  03/29/2022   LAPAROSCOPY N/A 08/21/2022   Procedure: LAPAROSCOPIC LOOP ILEOSTOMY;  Surgeon: Emelia Loron, MD;  Location: [facility];  Service: General;  Laterality: N/A;   uterine ablation       reports that she has never smoked. She has never used  smokeless tobacco. She reports that she does not currently use alcohol. She reports that she does not currently use drugs.  Allergies  Allergen Reactions   Augmentin [Amoxicillin-Pot Clavulanate] Diarrhea and Other (See Comments)    Extreme diarrhea, abdominal pain.   Irinotecan Other (See  Comments)    Flushing, tingling, visual disturbance   Metronidazole Diarrhea and Other (See Comments)   Vancomycin Rash and Other (See Comments)    "Red Man Syndrome"   Latex Rash   Tape Rash   Wound Dressing Adhesive Rash    Family History  Problem Relation Age of Onset   Lung cancer Mother    Diabetes Maternal Grandmother    Pancreatic cancer Maternal Grandfather    Multiple sclerosis Maternal Aunt    Alcohol abuse Maternal Uncle     Prior to Admission medications   Medication Sig Start Date End Date Taking? Authorizing Provider  alum & mag hydroxide-simeth suspension-nystatin suspension-diphenhydrAMINE liquid Take 5 mLs by mouth 4 (four) times daily as needed for mouth pain. 10/23/22  Yes Josph Macho, MD  baclofen (LIORESAL) 10 MG tablet Take 1 tablet (10 mg total) by mouth 3 (three) times daily. Patient taking differently: Take 10 mg by mouth as needed for muscle spasms. 08/28/22  Yes Ennever, Rose Phi, MD  dexamethasone (DECADRON) 4 MG tablet Take 2 tablets (8 mg total) by mouth daily. Start the day after chemotherapy for 2 days. Take with food. 10/17/22  Yes Josph Macho, MD  famotidine (PEPCID) 40 MG tablet Take 1 tablet (40 mg total) by mouth 2 (two) times daily. 08/07/22  Yes Ennever, Rose Phi, MD  HYDROmorphone (DILAUDID) 4 MG tablet Take 1 tablet (4 mg total) by mouth every 2 (two) hours as needed for severe pain. 10/08/22  Yes Hollice Espy, MD  LORazepam (ATIVAN) 0.5 MG tablet Place 1 tablet (0.5 mg total) under the tongue every 4 (four) hours as needed for anxiety. 09/21/22  Yes Josph Macho, MD  megestrol (MEGACE) 400 MG/10ML suspension Take 10 mLs (400 mg total) by mouth 2 (two) times daily. 09/21/22  Yes Ennever, Rose Phi, MD  meloxicam (MOBIC) 7.5 MG tablet Take 1 tablet (7.5 mg total) by mouth 2 (two) times daily. 11/01/22  Yes Pickenpack-Cousar, Arty Baumgartner, NP  morphine (MS CONTIN) 30 MG 12 hr tablet Take 1 tablet (30 mg total) by mouth every 8 (eight) hours.  10/19/22  Yes Pickenpack-Cousar, Arty Baumgartner, NP  OLANZapine (ZYPREXA) 10 MG tablet Take 1 tablet (10 mg total) by mouth at bedtime. 09/21/22  Yes Ennever, Rose Phi, MD  ondansetron (ZOFRAN) 4 MG tablet Take 1 tablet (4 mg total) by mouth every 6 (six) hours. 10/08/22  Yes Hollice Espy, MD  scopolamine (TRANSDERM-SCOP) 1 MG/3DAYS Place one patch behind the ear every three days. 09/10/22  Yes Josph Macho, MD  Sodium Chloride Flush (NORMAL SALINE FLUSH) 0.9 % SOLN Flush abdominal drain with 5 cc sterile saline once daily 10/05/22  Yes Allred, Darrell Kirtland Bouchard, PA-C    Physical Exam: Vitals:   11/03/22 0124 11/03/22 0300 11/03/22 0400 11/03/22 0403  BP: 113/72 (!) 96/49 (!) 97/34   Pulse: 87 93 93 92  Resp: (!) 24 19 17 17   Temp:      TempSrc:      SpO2: 100% 97% 97% 97%  Weight:      Height:        Constitutional: NAD, calm, comfortable Vitals:   11/03/22 0124 11/03/22 0300 11/03/22 0400 11/03/22 0403  BP: 113/72 (!) 96/49 (!) 97/34   Pulse: 87 93 93 92  Resp: (!) 24 19 17 17   Temp:      TempSrc:      SpO2: 100% 97% 97% 97%  Weight:      Height:       Eyes: PERRL, lids and conjunctivae normal ENMT: Mucous membranes are moist. Posterior pharynx clear of any exudate or lesions.Normal dentition.  Neck: normal, supple, no masses, no thyromegaly Respiratory: clear to auscultation bilaterally, no wheezing, no crackles. Normal respiratory effort. No accessory muscle use.  Cardiovascular: Regular rate and rhythm, no murmurs / rubs / gallops. No extremity edema. 2+ pedal pulses. No carotid bruits.  Abdomen: no tenderness, no masses palpated. No hepatosplenomegaly. Bowel sounds positive.  Musculoskeletal: no clubbing / cyanosis. No joint deformity upper and lower extremities. Good ROM, no contractures. Normal muscle tone.  Skin: no rashes, lesions, ulcers. No induration Neurologic: CN 2-12 grossly intact. Sensation intact, l. Strength 5/5 in all 4.  Psychiatric: Normal judgment and insight.  Alert and oriented x 3. Normal mood.    Labs on Admission: I have personally reviewed following labs and imaging studies  CBC: Recent Labs  Lab 10/31/22 0815 11/02/22 1438 11/02/22 1449  WBC 2.0* 0.8*  --   NEUTROABS 0.8* 0.4*  --   HGB 9.6* 10.0* 9.2*  HCT 29.8* 31.9* 27.0*  MCV 90.3 90.1  --   PLT 145* 158  --    Basic Metabolic Panel: Recent Labs  Lab 10/31/22 0815 11/02/22 1438 11/02/22 1449  NA 136 132* 136  K 3.2* 3.1* 3.3*  CL 107 102  --   CO2 21* 20*  --   GLUCOSE 105* 120*  --   BUN 6 10  --   CREATININE 0.47 0.80  --   CALCIUM 8.4* 8.0*  --    GFR: Estimated Creatinine Clearance: 72.9 mL/min (by C-G formula based on SCr of 0.8 mg/dL). Liver Function Tests: Recent Labs  Lab 10/31/22 0815 11/02/22 1438  AST 30 47*  ALT 24 29  ALKPHOS 217* 211*  BILITOT 1.1 1.7*  PROT 7.1 7.2  ALBUMIN 2.7* 2.8*   No results for input(s): "LIPASE", "AMYLASE" in the last 168 hours. No results for input(s): "AMMONIA" in the last 168 hours. Coagulation Profile: Recent Labs  Lab 11/02/22 1438  INR 1.5*   Cardiac Enzymes: No results for input(s): "CKTOTAL", "CKMB", "CKMBINDEX", "TROPONINI" in the last 168 hours. BNP (last 3 results) No results for input(s): "PROBNP" in the last 8760 hours. HbA1C: No results for input(s): "HGBA1C" in the last 72 hours. CBG: No results for input(s): "GLUCAP" in the last 168 hours. Lipid Profile: No results for input(s): "CHOL", "HDL", "LDLCALC", "TRIG", "CHOLHDL", "LDLDIRECT" in the last 72 hours. Thyroid Function Tests: No results for input(s): "TSH", "T4TOTAL", "FREET4", "T3FREE", "THYROIDAB" in the last 72 hours. Anemia Panel: No results for input(s): "VITAMINB12", "FOLATE", "FERRITIN", "TIBC", "IRON", "RETICCTPCT" in the last 72 hours. Urine analysis:    Component Value Date/Time   COLORURINE YELLOW 11/02/2022 1900   APPEARANCEUR CLEAR 11/02/2022 1900   LABSPEC 1.010 11/02/2022 1900   PHURINE 5.5 11/02/2022 1900    GLUCOSEU NEGATIVE 11/02/2022 1900   HGBUR NEGATIVE 11/02/2022 1900   BILIRUBINUR NEGATIVE 11/02/2022 1900   BILIRUBINUR negative 01/23/2019 1525   KETONESUR NEGATIVE 11/02/2022 1900   PROTEINUR NEGATIVE 11/02/2022 1900   UROBILINOGEN 0.2 01/23/2019 1525   NITRITE NEGATIVE 11/02/2022 1900   LEUKOCYTESUR NEGATIVE 11/02/2022 1900    Radiological Exams on Admission:  CT ABDOMEN PELVIS WO CONTRAST  Result Date: 11/02/2022 CLINICAL DATA:  Sepsis EXAM: CT ABDOMEN AND PELVIS WITHOUT CONTRAST TECHNIQUE: Multidetector CT imaging of the abdomen and pelvis was performed following the standard protocol without IV contrast. RADIATION DOSE REDUCTION: This exam was performed according to the departmental dose-optimization program which includes automated exposure control, adjustment of the mA and/or kV according to patient size and/or use of iterative reconstruction technique. COMPARISON:  CT abdomen and pelvis 10/03/2022 FINDINGS: Lower chest: No acute abnormality. Indeterminate nodules measuring up to 3 mm are similar to prior. Hepatobiliary: Large lobulated heterogenously hypoattenuating mass in the right hepatic dome is unchanged in size and measures approximately 11.4 cm. Unchanged hepatic cyst in the inferior right hepatic lobe. New percutaneous biliary drainage catheter. Decreased dilation of the intrahepatic bile ducts. Cholelithiasis with similar gallbladder wall thickening. Similar soft tissue in the porta hepatis which could represent lymphadenopathy or tumor. Pancreas: Unremarkable. Spleen: The spleen is mildly enlarged measuring 13.6 cm in craniocaudal dimension. Adrenals/Urinary Tract: Stable adrenal glands. No urinary calculi or hydronephrosis. Contrast within the bladder. Stomach/Bowel: Normal caliber large and small bowel. Bowel wall thickening of the cecum and right colon compatible with known primary colon cancer. Loop ileostomy in the right anterior abdomen. Vascular/Lymphatic: Unchanged porta  hepatis lymphadenopathy measuring up to 19 mm in short axis on series 2/image 24). 9 mm ileocolic node is unchanged. No acute vascular abnormality Reproductive: Uterus and bilateral adnexa are unremarkable. Other: Small volume free fluid in the pelvis. No free intraperitoneal air. Musculoskeletal: Sclerotic lesion in the L1 vertebral body with superior endplate compression fracture is unchanged. No acute fracture. IMPRESSION: 1. New percutaneous biliary drainage catheter with decreased dilation of the intrahepatic bile ducts. 2. Unchanged size of the large hepatic dome metastasis. Similar porta hepatis lymphadenopathy. 3. Unchanged wall thickening of the cecum and right colon compatible with known primary colon cancer. 4. Cholelithiasis and gallbladder wall thickening similar to prior 5. Unchanged sclerotic lesion in the L1 vertebral body with pathologic superior endplate compression fracture. 6. Small volume free fluid in the pelvis. 7. Mild splenomegaly. 8. Indeterminate pulmonary nodules measuring up to 3 mm are similar to prior. Electronically Signed   By: Minerva Fester M.D.   On: 11/02/2022 20:03   CT Angio Chest PE W/Cm &/Or Wo Cm  Result Date: 11/02/2022 CLINICAL DATA:  History of metastatic colon cancer, presenting with increased drowsiness. EXAM: CT ANGIOGRAPHY CHEST WITH CONTRAST TECHNIQUE: Multidetector CT imaging of the chest was performed using the standard protocol during bolus administration of intravenous contrast. Multiplanar CT image reconstructions and MIPs were obtained to evaluate the vascular anatomy. RADIATION DOSE REDUCTION: This exam was performed according to the departmental dose-optimization program which includes automated exposure control, adjustment of the mA and/or kV according to patient size and/or use of iterative reconstruction technique. CONTRAST:  OMNIPAQUE IOHEXOL 350 MG/ML SOLN COMPARISON:  Chest CT with contrast, dated April 05, 2021 and abdomen and pelvis CT  dated Oct 03, 2022 FINDINGS: Cardiovascular: A right-sided venous Port-A-Cath is in place. The thoracic aorta is normal in appearance. Satisfactory opacification of the pulmonary arteries to the segmental level. No evidence of pulmonary embolism. Normal heart size. No pericardial effusion. Mediastinum/Nodes: No enlarged mediastinal, hilar, or axillary lymph nodes. A 7 mm thyroid nodule is seen within the lower pole of the left lobe of the thyroid gland. The trachea and esophagus demonstrate no significant findings. Lungs/Pleura: Mild lingular and posterior right basilar atelectasis is seen. There is no evidence of acute infiltrate,  pleural effusion or pneumothorax. Upper Abdomen: There is diffuse fatty infiltration of the liver parenchyma. An 11.2 cm x 10.7 cm x 11.2 cm area of heterogeneous low attenuation is seen occupying the majority of the posterior aspect of the right lobe of the liver. A stable 1.6 cm x 1.7 cm x 1.8 cm area of parenchymal low attenuation is seen within the anterolateral aspect of an enlarged spleen. A percutaneous biliary drainage catheter is in place. A 1.6 cm gallstone is seen within a contracted gallbladder. Musculoskeletal: The L1 vertebral body is sclerotic and heterogeneous in appearance. This is seen on the prior abdomen and pelvis CT. Review of the MIP images confirms the above findings. IMPRESSION: 1. No evidence of pulmonary embolism. 2. Mild lingular and posterior right basilar atelectasis. 3. Large hepatic mass, consistent with metastatic disease. 4. Cholelithiasis. 5. Percutaneous biliary drainage catheter in place. 6. Findings consistent with osseous metastatic disease involving the L1 vertebral body. 7. 7 mm left thyroid nodule. No follow-up imaging is recommended. This follows ACR consensus guidelines: Managing Incidental Thyroid Nodules Detected on Imaging: White Paper of the ACR Incidental Thyroid Findings Committee. J Am Coll Radiol 2015; 12:143-150. Electronically Signed    By: Aram Candela M.D.   On: 11/02/2022 18:41   DG Chest 2 View  Result Date: 11/02/2022 CLINICAL DATA:  Shortness of breath. History of metastatic colon cancer. EXAM: CHEST - 2 VIEW COMPARISON:  08/18/2022 FINDINGS: No consolidation, pneumothorax or effusion. No edema. Normal cardiopericardial silhouette. Degenerative changes of the spine on lateral view. Stable right upper chest port with tip along the central SVC above the right atrium. Separate catheter in the upper abdomen. IMPRESSION: No acute cardiopulmonary disease.  Chest port Electronically Signed   By: Karen Kays M.D.   On: 11/02/2022 14:24    EKG: Independently reviewed.   Assessment/Plan  Sepsis unknown origin in setting of neutropenia -admit to stepdown  -continue with broad spectrum abx per protocol -s/p goal directed fluids in Ed -continue with ivfs  -albumin for soft bp  - lactic now wnl  - f/u on blood cultures /urine cultures  -of note s/p Udenyca injection 6/7 -oncology consult   Metastatic Colon Cancer  -on chemo -continue on chronic pain medications -await further oncology input      DVT prophylaxis:  heparin  Code Status: DNR  as discussed per patient wishes in event of cardiac arrest Family Communication:  Disposition Plan: patient  expected to be admitted greater than 2 midnights  Consults called: oncology  Admission status: SDU  Lurline Del MD [facility]   If 7PM-7AM, please contact night-coverage [email or web address] Password [facility]  11/03/2022, 4:32 AM

## 2022-11-03 NOTE — Progress Notes (Signed)
Progress Note    Rebecca Bullock   FYB:[id number]  DOB: March 12, 1966  DOA: 11/02/2022     0 PCP: Jomarie Longs, PA-C  Initial CC: fever, chills  Hospital Course: Rebecca Bullock is a 57 yo female with PMH Stage IV colorectal cancer (liver mets), anemia who presented with fevers, chills, shortness of breath.   She has undergone laparoscopic loop ileostomy on 08/21/2022.  She was recently hospitalized from 10/02/2022 until 10/08/2022 due to jaundice and found to have bile duct obstruction.  She underwent left hepatic biliary drain placement on 10/03/2022 with IR.  She had recent treatment of chemo on 10/31/2022. On admission, she was found to be tachycardic, tachypneic, hypotensive.  She was neutropenic.  Urinalysis was negative for signs of infection.  Blood cultures were also obtained.  She underwent imaging studies.  CXR unremarkable.  CT chest/abdomen/pelvis was obtained which was negative for PE and did not show signs of underlying acute infection.   She was started on broad-spectrum antibiotics and admitted for further workup.  Interval History:  Resting in bed when seen this morning.  She is awake, alert, good level of energy and does not appear toxic. Reviewed blood culture results with patient.   Assessment and Plan: * Severe sepsis (HCC) - Tachycardia, tachypnea, severe neutropenia, lactic acidosis -Source initially unknown on admission but with blood cultures growing Klebsiella and recent biliary drain placement, suspect GI source at this time -Continue following blood cultures for sensitivities - De-escalate antibiotics to Rocephin  Severe neutropenia (HCC) - ANC < 500 currently - continue neutropenic precautions - continue granix - continue treatment of Klebsiella   Bacteremia due to Klebsiella pneumoniae - see sepsis/biliary obstruction -Suspect GI source at this time - Follow-up radiology eval of biliary drain - Continue Rocephin  Bile duct obstruction, intrahepatic - s/p  left hepatic biliary drain placement on 10/03/2022 -Blood culture not growing Klebsiella.  Concern for infection associated with drain -Continue antibiotics - IR eval for possible drain remove versus exchange  Colon cancer metastasized to liver (HCC) - on bevacizumab, irinotecan, fluorouracil; recent treatment 10/31/22 - nadir for fluorouracil 9-14 days  - follows with Dr. Myna Hidalgo   Hypomagnesemia - replete as needed  Hypokalemia - replete as needed   Old records reviewed in assessment of this patient  Antimicrobials: Cefepime 6/7 x 1 Vanc 6/7 x 1 Meropenem 6/8 x 1 Rocephin 6/8 >> current   DVT prophylaxis:  heparin injection 5,000 Units Start: 11/03/22 0600   Code Status:   Code Status: Full Code  Mobility Assessment (last 72 hours)     Mobility Assessment   No documentation.           Barriers to discharge:  Disposition Plan:  Home Status is: Inpt  Objective: Blood pressure (!) 94/51, pulse 96, temperature 99.3 F (37.4 C), temperature source Oral, resp. rate 18, height 5\' 1"  (1.549 m), weight 75.8 kg, SpO2 97 %.  Examination:  Physical Exam Constitutional:      General: She is not in acute distress.    Appearance: She is well-developed. She is not ill-appearing.  HENT:     Head: Normocephalic and atraumatic.     Mouth/Throat:     Mouth: Mucous membranes are moist.  Eyes:     Extraocular Movements: Extraocular movements intact.  Cardiovascular:     Rate and Rhythm: Normal rate and regular rhythm.  Pulmonary:     Effort: Pulmonary effort is normal. No respiratory distress.     Breath sounds: Normal breath sounds.  No wheezing.  Abdominal:     General: Bowel sounds are normal. There is no distension.     Palpations: Abdomen is soft.     Tenderness: There is no abdominal tenderness.     Comments: Ostomy bag in place, no issues noted. Biliary drain in place with fluid noted in bag  Musculoskeletal:        General: No swelling. Normal range of motion.      Cervical back: Normal range of motion.  Skin:    General: Skin is warm and dry.     Coloration: Skin is not jaundiced.     Findings: No bruising.  Neurological:     General: No focal deficit present.     Mental Status: She is alert.  Psychiatric:        Mood and Affect: Mood normal.      Consultants:  Oncology  Procedures:    Data Reviewed: Results for orders placed or performed during the hospital encounter of 11/02/22 (from the past 24 hour(s))  Blood Culture (routine x 2)     Status: None (Preliminary result)   Collection Time: 11/02/22  2:37 PM   Specimen: BLOOD  Result Value Ref Range   Specimen Description      BLOOD RIGHT ANTECUBITAL Performed at [facility], [address] [place], [place] [address]    Special Requests      BOTTLES DRAWN AEROBIC AND ANAEROBIC Blood Culture results may not be optimal due to an inadequate volume of blood received in culture bottles Performed at [facility], [address] [place], [place] [address]    Culture  Setup Time      GRAM NEGATIVE RODS IN BOTH AEROBIC AND ANAEROBIC BOTTLES CRITICAL RESULT CALLED TO, READ BACK BY AND VERIFIED WITH: Malcolm Metro [id number] FCP Performed at [facility], [address] [place], [place] [address]    Culture GRAM NEGATIVE RODS    Report Status PENDING   Blood Culture ID Panel (Reflexed)     Status: Abnormal   Collection Time: 11/02/22  2:37 PM  Result Value Ref Range   Enterococcus faecalis NOT DETECTED NOT DETECTED   Enterococcus Faecium NOT DETECTED NOT DETECTED   Listeria monocytogenes NOT DETECTED NOT DETECTED   Staphylococcus species NOT DETECTED NOT DETECTED   Staphylococcus aureus (BCID) NOT DETECTED NOT DETECTED   Staphylococcus epidermidis NOT DETECTED NOT DETECTED   Staphylococcus lugdunensis NOT DETECTED NOT DETECTED   Streptococcus species NOT DETECTED NOT DETECTED   Streptococcus agalactiae NOT DETECTED NOT DETECTED   Streptococcus  pneumoniae NOT DETECTED NOT DETECTED   Streptococcus pyogenes NOT DETECTED NOT DETECTED   A.calcoaceticus-baumannii NOT DETECTED NOT DETECTED   Bacteroides fragilis NOT DETECTED NOT DETECTED   Enterobacterales DETECTED (A) NOT DETECTED   Enterobacter cloacae complex NOT DETECTED NOT DETECTED   Escherichia coli NOT DETECTED NOT DETECTED   Klebsiella aerogenes NOT DETECTED NOT DETECTED   Klebsiella oxytoca NOT DETECTED NOT DETECTED   Klebsiella pneumoniae DETECTED (A) NOT DETECTED   Proteus species NOT DETECTED NOT DETECTED   Salmonella species NOT DETECTED NOT DETECTED   Serratia marcescens NOT DETECTED NOT DETECTED   Haemophilus influenzae NOT DETECTED NOT DETECTED   Neisseria meningitidis NOT DETECTED NOT DETECTED   Pseudomonas aeruginosa NOT DETECTED NOT DETECTED   Stenotrophomonas maltophilia NOT DETECTED NOT DETECTED   Candida albicans NOT DETECTED NOT DETECTED   Candida auris NOT DETECTED NOT DETECTED   Candida glabrata NOT DETECTED  NOT DETECTED   Candida krusei NOT DETECTED NOT DETECTED   Candida parapsilosis NOT DETECTED NOT DETECTED   Candida tropicalis NOT DETECTED NOT DETECTED   Cryptococcus neoformans/gattii NOT DETECTED NOT DETECTED   CTX-M ESBL NOT DETECTED NOT DETECTED   Carbapenem resistance IMP NOT DETECTED NOT DETECTED   Carbapenem resistance KPC NOT DETECTED NOT DETECTED   Carbapenem resistance NDM NOT DETECTED NOT DETECTED   Carbapenem resist OXA 48 LIKE NOT DETECTED NOT DETECTED   Carbapenem resistance VIM NOT DETECTED NOT DETECTED  Lactic acid, plasma     Status: Abnormal   Collection Time: 11/02/22  2:38 PM  Result Value Ref Range   Lactic Acid, Venous 2.9 (HH) 0.5 - 1.9 mmol/L  Comprehensive metabolic panel     Status: Abnormal   Collection Time: 11/02/22  2:38 PM  Result Value Ref Range   Sodium 132 (L) 135 - 145 mmol/L   Potassium 3.1 (L) 3.5 - 5.1 mmol/L   Chloride 102 98 - 111 mmol/L   CO2 20 (L) 22 - 32 mmol/L   Glucose, Bld 120 (H) 70 - 99 mg/dL    BUN 10 6 - 20 mg/dL   Creatinine, Ser 1.61 0.44 - 1.00 mg/dL   Calcium 8.0 (L) 8.9 - 10.3 mg/dL   Total Protein 7.2 6.5 - 8.1 g/dL   Albumin 2.8 (L) 3.5 - 5.0 g/dL   AST 47 (H) 15 - 41 U/L   ALT 29 0 - 44 U/L   Alkaline Phosphatase 211 (H) 38 - 126 U/L   Total Bilirubin 1.7 (H) 0.3 - 1.2 mg/dL   GFR, Estimated >09 >60 mL/min   Anion gap 10 5 - 15  CBC with Differential     Status: Abnormal   Collection Time: 11/02/22  2:38 PM  Result Value Ref Range   WBC 0.8 (LL) 4.0 - 10.5 K/uL   RBC 3.54 (L) 3.87 - 5.11 MIL/uL   Hemoglobin 10.0 (L) 12.0 - 15.0 g/dL   HCT 45.4 (L) 09.8 - 11.9 %   MCV 90.1 80.0 - 100.0 fL   MCH 28.2 26.0 - 34.0 pg   MCHC 31.3 30.0 - 36.0 g/dL   RDW 14.7 (H) 82.9 - 56.2 %   Platelets 158 150 - 400 K/uL   nRBC 0.0 0.0 - 0.2 %   Neutrophils Relative % 56 %   Neutro Abs 0.4 (LL) 1.7 - 7.7 K/uL   Lymphocytes Relative 42 %   Lymphs Abs 0.3 (L) 0.7 - 4.0 K/uL   Monocytes Relative 1 %   Monocytes Absolute 0.0 (L) 0.1 - 1.0 K/uL   Eosinophils Relative 0 %   Eosinophils Absolute 0.0 0.0 - 0.5 K/uL   Basophils Relative 0 %   Basophils Absolute 0.0 0.0 - 0.1 K/uL   WBC Morphology MORPHOLOGY UNREMARKABLE    RBC Morphology MORPHOLOGY UNREMARKABLE    Smear Review Normal platelet morphology    Immature Granulocytes 1 %   Abs Immature Granulocytes 0.01 0.00 - 0.07 K/uL  Protime-INR     Status: Abnormal   Collection Time: 11/02/22  2:38 PM  Result Value Ref Range   Prothrombin Time 18.5 (H) 11.4 - 15.2 seconds   INR 1.5 (H) 0.8 - 1.2  APTT     Status: None   Collection Time: 11/02/22  2:38 PM  Result Value Ref Range   aPTT 29 24 - 36 seconds  Troponin I (High Sensitivity)     Status: None   Collection Time: 11/02/22  2:38 PM  Result Value Ref Range   Troponin I (High Sensitivity) 7 <18 ng/L  I-Stat venous blood gas, ED     Status: Abnormal   Collection Time: 11/02/22  2:49 PM  Result Value Ref Range   pH, Ven 7.408 7.25 - 7.43   pCO2, Ven 34.2 (L) 44 - 60  mmHg   pO2, Ven 19 (LL) 32 - 45 mmHg   Bicarbonate 21.6 20.0 - 28.0 mmol/L   TCO2 23 22 - 32 mmol/L   O2 Saturation 29 %   Acid-base deficit 3.0 (H) 0.0 - 2.0 mmol/L   Sodium 136 135 - 145 mmol/L   Potassium 3.3 (L) 3.5 - 5.1 mmol/L   Calcium, Ion 1.16 1.15 - 1.40 mmol/L   HCT 27.0 (L) 36.0 - 46.0 %   Hemoglobin 9.2 (L) 12.0 - 15.0 g/dL   Patient temperature 82.9 F    Collection site IV start    Drawn by Operator    Sample type VENOUS    Comment VALUES EXPECTED, NO REPEAT   Lactic acid, plasma     Status: Abnormal   Collection Time: 11/02/22  4:58 PM  Result Value Ref Range   Lactic Acid, Venous 3.3 (HH) 0.5 - 1.9 mmol/L  Troponin I (High Sensitivity)     Status: None   Collection Time: 11/02/22  4:58 PM  Result Value Ref Range   Troponin I (High Sensitivity) 7 <18 ng/L  Lactic acid, plasma     Status: Abnormal   Collection Time: 11/02/22  6:42 PM  Result Value Ref Range   Lactic Acid, Venous 2.4 (HH) 0.5 - 1.9 mmol/L  Pregnancy, urine     Status: None   Collection Time: 11/02/22  7:00 PM  Result Value Ref Range   Preg Test, Ur NEGATIVE NEGATIVE  Urinalysis, w/ Reflex to Culture (Infection Suspected) -Urine, Clean Catch     Status: Abnormal   Collection Time: 11/02/22  7:00 PM  Result Value Ref Range   Specimen Source URINE, CLEAN CATCH    Color, Urine YELLOW YELLOW   APPearance CLEAR CLEAR   Specific Gravity, Urine 1.010 1.005 - 1.030   pH 5.5 5.0 - 8.0   Glucose, UA NEGATIVE NEGATIVE mg/dL   Hgb urine dipstick NEGATIVE NEGATIVE   Bilirubin Urine NEGATIVE NEGATIVE   Ketones, ur NEGATIVE NEGATIVE mg/dL   Protein, ur NEGATIVE NEGATIVE mg/dL   Nitrite NEGATIVE NEGATIVE   Leukocytes,Ua NEGATIVE NEGATIVE   Squamous Epithelial / HPF 0-5 0 - 5 /HPF   WBC, UA 0-5 0 - 5 WBC/hpf   RBC / HPF NONE SEEN 0 - 5 RBC/hpf   Bacteria, UA RARE (A) NONE SEEN  Lactic acid, plasma     Status: None   Collection Time: 11/02/22  9:53 PM  Result Value Ref Range   Lactic Acid, Venous 1.7  0.5 - 1.9 mmol/L  MRSA Next Gen by PCR, Nasal     Status: None   Collection Time: 11/03/22  2:14 AM   Specimen: Nasal Mucosa; Nasal Swab  Result Value Ref Range   MRSA by PCR Next Gen NOT DETECTED NOT DETECTED  Culture, blood (x 2)     Status: None (Preliminary result)   Collection Time: 11/03/22  7:28 AM   Specimen: BLOOD LEFT HAND  Result Value Ref Range   Specimen Description      BLOOD LEFT HAND Performed at [facility], [address], [place], [place] [address]    Special Requests  AEROBIC BOTTLE ONLY Blood Culture adequate volume Performed at [facility], [address], [place], [place] [address]    Culture PENDING    Report Status PENDING   Magnesium     Status: Abnormal   Collection Time: 11/03/22  7:28 AM  Result Value Ref Range   Magnesium 1.4 (L) 1.7 - 2.4 mg/dL  Phosphorus     Status: None   Collection Time: 11/03/22  7:28 AM  Result Value Ref Range   Phosphorus 2.5 2.5 - 4.6 mg/dL  TSH     Status: None   Collection Time: 11/03/22  7:28 AM  Result Value Ref Range   TSH 1.463 0.350 - 4.500 uIU/mL  Culture, blood (x 2)     Status: None (Preliminary result)   Collection Time: 11/03/22  7:39 AM   Specimen: BLOOD RIGHT HAND  Result Value Ref Range   Specimen Description      BLOOD RIGHT HAND Performed at [facility], [address], [place], [place] [address]    Special Requests      AEROBIC BOTTLE ONLY Blood Culture adequate volume Performed at [facility], [address] [place], [place] [address]    Culture PENDING    Report Status PENDING     I have reviewed pertinent nursing notes, vitals, labs, and images as necessary. I have ordered labwork to follow up on as indicated.  I have reviewed the last notes from staff over past 24 hours. I have discussed patient's care plan and test results with nursing staff, CM/SW, and other staff as appropriate.  Time spent: Greater than 50% of the 55 minute  visit was spent in counseling/coordination of care for the patient as laid out in the A&P.   LOS: 0 days   Lewie Chamber, MD [facility] 11/03/2022, 12:48 PM

## 2022-11-03 NOTE — Plan of Care (Signed)

## 2022-11-03 NOTE — Sepsis Progress Note (Signed)
Code Sepsis activated at 1505 11/02/22. Completed at 2105 11/02/22

## 2022-11-03 NOTE — Assessment & Plan Note (Addendum)
-   Tachycardia, tachypnea, severe neutropenia, lactic acidosis -Source initially unknown on admission but with blood cultures growing Klebsiella and recent biliary drain placement, suspect GI source at this time -Sensitivities reviewed from cultures.  Will plan to transition to cefadroxil to complete course at discharge.  Continue Rocephin while in hospital

## 2022-11-03 NOTE — Hospital Course (Signed)
Rebecca Bullock is a 57 yo female with PMH Stage IV colorectal cancer (liver mets), anemia who presented with fevers, chills, shortness of breath.   She has undergone laparoscopic loop ileostomy on 08/21/2022.  She was recently hospitalized from 10/02/2022 until 10/08/2022 due to jaundice and found to have bile duct obstruction.  She underwent left hepatic biliary drain placement on 10/03/2022 with IR.  She had recent treatment of chemo on 10/31/2022. On admission, she was found to be tachycardic, tachypneic, hypotensive.  She was neutropenic.  Urinalysis was negative for signs of infection.  Blood cultures were also obtained.  She underwent imaging studies.  CXR unremarkable.  CT chest/abdomen/pelvis was obtained which was negative for PE and did not show signs of underlying acute infection.   She was started on broad-spectrum antibiotics and admitted for further workup.

## 2022-11-03 NOTE — Assessment & Plan Note (Signed)
-   see sepsis/biliary obstruction -Suspect GI source at this time - Patient has scheduled cholangiogram with IR already - Continue Rocephin

## 2022-11-03 NOTE — Consult Note (Addendum)
Rebecca Bullock is well-known to me.  She is a very charming 57 year old white female.  She has metastatic colorectal adenocarcinoma.  She has been through multiple lines of chemotherapy.  She has had intrahepatic chemotherapy.  She has a colostomy secondary to obstruction.  She has a biliary catheter secondary to biliary obstruction by her hepatic mass.  She is on chemotherapy with FOLFIRI/Avastin.  She had treatment this past week.  We have been given her Neulasta.  I think she got treatment on 10/31/2022.  I think she is responding.  Her last CEA level was down to 8.9.  She did not feel well yesterday.  She had hypotension.  She was weak.  She was brought to the emergency room and ultimately admitted.  She had hypotension.  She was neutropenic.  Her white cell count was 0.8.  Hemoglobin 10.  Platelet count 158,000.  Her electrolytes show sodium 132.  Potassium 3.1.  BUN 10 creatinine 0.8.  Blood sugar 120.  Calcium 8 with an albumin of 2.8.  Her bilirubin was 1.7.  Cultures have been taken.  She had a CT scan that was done.  There is no pulmonary embolism.  There is no obvious pneumonia.  She has some atelectasis.  Patient has CT scan of the abdomen and pelvis which was also unremarkable.  She had a large hepatic mass.  She currently is on antibiotics with Merrem and vancomycin.  She still has not quite a bit of back pain.  She has involvement of the L1 vertebral body.  We may have to think about kyphoplasty, if possible, in that area.  She is getting some albumin right now.  She has had no vomiting.  Her colostomy is working okay.   Her vital signs are temperature 99.4.  Pulse 92.  Blood pressure 91/70.  Her head and neck exam shows no ocular or oral lesions.  She has no palpable cervical or supraclavicular lymph nodes.  Lungs are clear bilaterally.  She has good air movement bilaterally.  Cardiac exam regular rate and rhythm.  She has no murmurs.  Abdomen is soft.  She has a colostomy that is  intact.  There is no abdominal distention.  Bowel sounds are decreased.  There is no guarding or rebound tenderness.  There is no obvious fluid wave.  Her liver edge may be palpable at the right costal margin.  Extremity shows no clubbing, cyanosis or edema.  Neurological exam shows no focal neurological deficits.    Rebecca Bullock has metastatic colorectal cancer.  She is on systemic chemotherapy.  She is neutropenic.  I am somewhat surprised that white cell count has dropped so quickly.  We will have to see what her cultures show.  We will go ahead and give her some Neupogen.  Again, she is having the back pain which we really have not been able to get under good control.  I just wonder if she would be a candidate for kyphoplasty.  I would get Interventional Radiology involved to see with a would have to say.  I would like to think that her white cell count will start trending up.  We will see this when her monocytes start to increase.  She actually does not look as bad as I would have thought.  I know that when she has been hospitalized in the past, she really has had a tough time.  She is quite motivated.  She is doing everything that we have asked her to do.  I  know that the ICU staff will do a fantastic job with her.   Christin Bach, MD  Fayrene Fearing 1:5

## 2022-11-03 NOTE — Sepsis Progress Note (Signed)
Elink monitoring for the code sepsis protocol.  

## 2022-11-03 NOTE — Assessment & Plan Note (Signed)
-   s/p left hepatic biliary drain placement on 10/03/2022 -Blood culture now growing Klebsiella.  Concern for infection associated with drain -Continue antibiotics - Evaluated by IR.  Patient underwent cholangiogram with biliary drainage catheter exchange on 11/05/2022.  Cholangiogram did show good positioning and patency of biliary drain

## 2022-11-04 DIAGNOSIS — R6520 Severe sepsis without septic shock: Secondary | ICD-10-CM | POA: Diagnosis not present

## 2022-11-04 DIAGNOSIS — D709 Neutropenia, unspecified: Secondary | ICD-10-CM | POA: Diagnosis not present

## 2022-11-04 DIAGNOSIS — A419 Sepsis, unspecified organism: Secondary | ICD-10-CM | POA: Diagnosis not present

## 2022-11-04 DIAGNOSIS — S32000A Wedge compression fracture of unspecified lumbar vertebra, initial encounter for closed fracture: Secondary | ICD-10-CM

## 2022-11-04 DIAGNOSIS — K831 Obstruction of bile duct: Secondary | ICD-10-CM | POA: Diagnosis not present

## 2022-11-04 LAB — COMPREHENSIVE METABOLIC PANEL
ALT: 21 U/L (ref 0–44)
AST: 25 U/L (ref 15–41)
Albumin: 2.1 g/dL — ABNORMAL LOW (ref 3.5–5.0)
Alkaline Phosphatase: 133 U/L — ABNORMAL HIGH (ref 38–126)
Anion gap: 7 (ref 5–15)
BUN: 8 mg/dL (ref 6–20)
CO2: 21 mmol/L — ABNORMAL LOW (ref 22–32)
Calcium: 7 mg/dL — ABNORMAL LOW (ref 8.9–10.3)
Chloride: 106 mmol/L (ref 98–111)
Creatinine, Ser: 0.49 mg/dL (ref 0.44–1.00)
GFR, Estimated: >60 mL/min (ref >60)
Glucose, Bld: 87 mg/dL (ref 70–99)
Potassium: 2.8 mmol/L — ABNORMAL LOW (ref 3.5–5.1)
Sodium: 134 mmol/L — ABNORMAL LOW (ref 135–145)
Total Bilirubin: 1.4 mg/dL — ABNORMAL HIGH (ref 0.3–1.2)
Total Protein: 5 g/dL — ABNORMAL LOW (ref 6.5–8.1)

## 2022-11-04 LAB — CBC WITH DIFFERENTIAL/PLATELET
Abs Immature Granulocytes: 0 10*3/uL (ref 0.00–0.07)
Basophils Absolute: 0 10*3/uL (ref 0.0–0.1)
Basophils Relative: 0 %
Eosinophils Absolute: 0 10*3/uL (ref 0.0–0.5)
Eosinophils Relative: 3 %
HCT: 24.5 % — ABNORMAL LOW (ref 36.0–46.0)
Hemoglobin: 7.5 g/dL — ABNORMAL LOW (ref 12.0–15.0)
Lymphocytes Relative: 31 %
Lymphs Abs: 0.4 10*3/uL — ABNORMAL LOW (ref 0.7–4.0)
MCH: 28.3 pg (ref 26.0–34.0)
MCHC: 30.6 g/dL (ref 30.0–36.0)
MCV: 92.5 fL (ref 80.0–100.0)
Monocytes Absolute: 0.1 10*3/uL (ref 0.1–1.0)
Monocytes Relative: 5 %
Neutro Abs: 0.8 10*3/uL — ABNORMAL LOW (ref 1.7–7.7)
Neutrophils Relative %: 61 %
Platelets: 91 10*3/uL — ABNORMAL LOW (ref 150–400)
RBC: 2.65 MIL/uL — ABNORMAL LOW (ref 3.87–5.11)
RDW: 17.2 % — ABNORMAL HIGH (ref 11.5–15.5)
WBC: 1.3 10*3/uL — CL (ref 4.0–10.5)
nRBC: 0 % (ref 0.0–0.2)

## 2022-11-04 LAB — CULTURE, BLOOD (ROUTINE X 2): Special Requests: ADEQUATE

## 2022-11-04 LAB — MAGNESIUM: Magnesium: 2.1 mg/dL (ref 1.7–2.4)

## 2022-11-04 MED ORDER — MIDODRINE HCL 2.5 MG PO TABS
2.5000 mg | ORAL_TABLET | Freq: Three times a day (TID) | ORAL | Status: DC
  Administered 2022-11-04 – 2022-11-08 (×11): 2.5 mg via ORAL
  Filled 2022-11-04 (×12): qty 1

## 2022-11-04 MED ORDER — HYDROMORPHONE HCL 2 MG PO TABS
4.0000 mg | ORAL_TABLET | ORAL | Status: DC | PRN
  Administered 2022-11-04 – 2022-11-07 (×18): 4 mg via ORAL
  Filled 2022-11-04 (×19): qty 2

## 2022-11-04 MED ORDER — OXYCODONE HCL 5 MG PO TABS
5.0000 mg | ORAL_TABLET | ORAL | Status: DC | PRN
  Administered 2022-11-04 (×2): 5 mg via ORAL
  Filled 2022-11-04 (×2): qty 1

## 2022-11-04 MED ORDER — MORPHINE SULFATE ER 30 MG PO TBCR
30.0000 mg | EXTENDED_RELEASE_TABLET | Freq: Three times a day (TID) | ORAL | Status: DC
  Administered 2022-11-04 – 2022-11-07 (×9): 30 mg via ORAL
  Filled 2022-11-04 (×9): qty 1

## 2022-11-04 MED ORDER — CALCIUM GLUCONATE-NACL 2-0.675 GM/100ML-% IV SOLN
2.0000 g | Freq: Once | INTRAVENOUS | Status: AC
  Administered 2022-11-04: 2000 mg via INTRAVENOUS
  Filled 2022-11-04: qty 100

## 2022-11-04 MED ORDER — POTASSIUM CHLORIDE CRYS ER 20 MEQ PO TBCR
40.0000 meq | EXTENDED_RELEASE_TABLET | Freq: Once | ORAL | Status: AC
  Administered 2022-11-04: 40 meq via ORAL
  Filled 2022-11-04: qty 2

## 2022-11-04 NOTE — Progress Notes (Addendum)
Brief Interventional Radiology Note:  Request received to evaluate biliary drain for removal/exchange due to bacteremia.  Dr. Lowella Dandy, IR, reviewed and advised as long as biliary drain is working to leave drain in place due to risk for causing worsening bacteremia with manipulation of biliary system.  He recommends cholangiogram next week.  Dr. Frederick Peers made aware of recommendation and agrees with plan.    Alex Gardener, AGNP-BC 11/04/2022, 1:27 PM

## 2022-11-04 NOTE — Progress Notes (Signed)
Progress Note    Rebecca Bullock   ZOX:[id number]  DOB: [birth year]  DOA: 11/02/2022     1 PCP: Jomarie Longs, PA-C  Initial CC: fever, chills  Hospital Course: Rebecca Bullock is a 57 yo female with PMH Stage IV colorectal cancer (liver mets), anemia who presented with fevers, chills, shortness of breath.   She has undergone laparoscopic loop ileostomy on 08/21/2022.  She was recently hospitalized from 10/02/2022 until 10/08/2022 due to jaundice and found to have bile duct obstruction.  She underwent left hepatic biliary drain placement on 10/03/2022 with IR.  She had recent treatment of chemo on 10/31/2022. On admission, she was found to be tachycardic, tachypneic, hypotensive.  She was neutropenic.  Urinalysis was negative for signs of infection.  Blood cultures were also obtained.  She underwent imaging studies.  CXR unremarkable.  CT chest/abdomen/pelvis was obtained which was negative for PE and did not show signs of underlying acute infection.   She was started on broad-spectrum antibiotics and admitted for further workup.  Interval History:  Ongoing back pain overnight.  Blood pressure slightly improved and discussed we will try and resume her home opioid regimen for back pain control.  Denies fevers, chills, sweats.  Overall feeling much better.  She was actually interested in going home but discussed we need to await further culture results and also IR evaluation.  Assessment and Plan: * Severe sepsis (HCC) - Tachycardia, tachypnea, severe neutropenia, lactic acidosis -Source initially unknown on admission but with blood cultures growing Klebsiella and recent biliary drain placement, suspect GI source at this time -Continue following blood cultures for sensitivities - De-escalated antibiotics to Rocephin  Severe neutropenia (HCC) - ANC < 500 on admission - continue neutropenic precautions - continue granix - continue treatment of Klebsiella   Bacteremia due to Klebsiella pneumoniae -  see sepsis/biliary obstruction -Suspect GI source at this time - Patient has scheduled cholangiogram with IR already - Continue Rocephin  Bile duct obstruction, intrahepatic - s/p left hepatic biliary drain placement on 10/03/2022 -Blood culture now growing Klebsiella.  Concern for infection associated with drain -Continue antibiotics - Discussed with IR.  Drain to remain in place with plans for cholangiogram as previously scheduled.  Currently drain is functioning well  Colon cancer metastasized to liver [facility]) - on bevacizumab, irinotecan, fluorouracil; recent treatment 10/31/22 - nadir for fluorouracil 9-14 days  - follows with Dr. Myna Hidalgo   Lumbar compression fracture [facility]) - Patient has known L1 pathologic compression fracture.  On chronic opioids at home for control.  Oncology interested in IR evaluation for possible kyphoplasty; consult placed -Continue pain control - Follow-up IR eval  Hypomagnesemia - replete as needed  Hypokalemia - replete as needed   Old records reviewed in assessment of this patient  Antimicrobials: Cefepime 6/7 x 1 Vanc 6/7 x 1 Meropenem 6/8 x 1 Rocephin 6/8 >> current   DVT prophylaxis:  heparin injection 5,000 Units Start: 11/03/22 0600   Code Status:   Code Status: Full Code  Mobility Assessment (last 72 hours)     Mobility Assessment   No documentation.           Barriers to discharge:  Disposition Plan:  Home Status is: Inpt  Objective: Blood pressure (!) 91/46, pulse 85, temperature 98.1 F (36.7 C), temperature source Oral, resp. rate 15, height 5\' 1"  (1.549 m), weight 75.8 kg, SpO2 95 %.  Examination:  Physical Exam Constitutional:      General: She is not in acute distress.  Appearance: She is well-developed. She is not ill-appearing.  HENT:     Head: Normocephalic and atraumatic.     Mouth/Throat:     Mouth: Mucous membranes are moist.  Eyes:     Extraocular Movements: Extraocular movements intact.   Cardiovascular:     Rate and Rhythm: Normal rate and regular rhythm.  Pulmonary:     Effort: Pulmonary effort is normal. No respiratory distress.     Breath sounds: Normal breath sounds. No wheezing.  Abdominal:     General: Bowel sounds are normal. There is no distension.     Palpations: Abdomen is soft.     Tenderness: There is no abdominal tenderness.     Comments: Ostomy bag in place, no issues noted. Biliary drain in place with fluid noted in bag  Musculoskeletal:        General: No swelling. Normal range of motion.     Cervical back: Normal range of motion.  Skin:    General: Skin is warm and dry.     Coloration: Skin is not jaundiced.     Findings: No bruising.  Neurological:     General: No focal deficit present.     Mental Status: She is alert.  Psychiatric:        Mood and Affect: Mood normal.      Consultants:  Oncology IR  Procedures:    Data Reviewed: Results for orders placed or performed during the hospital encounter of 11/02/22 (from the past 24 hour(s))  CBC with Differential/Platelet     Status: Abnormal   Collection Time: 11/04/22  6:30 AM  Result Value Ref Range   WBC 1.3 (LL) 4.0 - 10.5 K/uL   RBC 2.65 (L) 3.87 - 5.11 MIL/uL   Hemoglobin 7.5 (L) 12.0 - 15.0 g/dL   HCT 16.1 (L) 09.6 - 04.5 %   MCV 92.5 80.0 - 100.0 fL   MCH 28.3 26.0 - 34.0 pg   MCHC 30.6 30.0 - 36.0 g/dL   RDW 40.9 (H) 81.1 - 91.4 %   Platelets 91 (L) 150 - 400 K/uL   nRBC 0.0 0.0 - 0.2 %   Neutrophils Relative % 61 %   Neutro Abs 0.8 (L) 1.7 - 7.7 K/uL   Lymphocytes Relative 31 %   Lymphs Abs 0.4 (L) 0.7 - 4.0 K/uL   Monocytes Relative 5 %   Monocytes Absolute 0.1 0.1 - 1.0 K/uL   Eosinophils Relative 3 %   Eosinophils Absolute 0.0 0.0 - 0.5 K/uL   Basophils Relative 0 %   Basophils Absolute 0.0 0.0 - 0.1 K/uL   WBC Morphology Mild Left Shift (1-5% metas, occ myelo)    Abs Immature Granulocytes 0.00 0.00 - 0.07 K/uL  Comprehensive metabolic panel     Status: Abnormal    Collection Time: 11/04/22  6:30 AM  Result Value Ref Range   Sodium 134 (L) 135 - 145 mmol/L   Potassium 2.8 (L) 3.5 - 5.1 mmol/L   Chloride 106 98 - 111 mmol/L   CO2 21 (L) 22 - 32 mmol/L   Glucose, Bld 87 70 - 99 mg/dL   BUN 8 6 - 20 mg/dL   Creatinine, Ser 7.82 0.44 - 1.00 mg/dL   Calcium 7.0 (L) 8.9 - 10.3 mg/dL   Total Protein 5.0 (L) 6.5 - 8.1 g/dL   Albumin 2.1 (L) 3.5 - 5.0 g/dL   AST 25 15 - 41 U/L   ALT 21 0 - 44 U/L   Alkaline Phosphatase 133 (  H) 38 - 126 U/L   Total Bilirubin 1.4 (H) 0.3 - 1.2 mg/dL   GFR, Estimated >16 >10 mL/min   Anion gap 7 5 - 15  Magnesium     Status: None   Collection Time: 11/04/22  6:30 AM  Result Value Ref Range   Magnesium 2.1 1.7 - 2.4 mg/dL    I have reviewed pertinent nursing notes, vitals, labs, and images as necessary. I have ordered labwork to follow up on as indicated.  I have reviewed the last notes from staff over past 24 hours. I have discussed patient's care plan and test results with nursing staff, CM/SW, and other staff as appropriate.  Time spent: Greater than 50% of the 55 minute visit was spent in counseling/coordination of care for the patient as laid out in the A&P.   LOS: 1 day   Lewie Chamber, MD [facility] 11/04/2022, 2:14 PM

## 2022-11-04 NOTE — Assessment & Plan Note (Signed)
-   Patient has known L1 pathologic compression fracture.  On chronic opioids at home for control.  Oncology interested in IR evaluation for possible kyphoplasty; consult placed -Continue pain control - No plans for kyphoplasty while bacteremic per IR

## 2022-11-04 NOTE — Plan of Care (Signed)
POC, goals and general patient guidebook discussed with time given for questions and answers, patient has good understanding.  Problem: Education: Goal: Knowledge of General Education information will improve Description: Including pain rating scale, medication(s)/side effects and non-pharmacologic comfort measures Outcome: Progressing   Problem: Health Behavior/Discharge Planning: Goal: Ability to manage health-related needs will improve Outcome: Progressing   Problem: Clinical Measurements: Goal: Ability to maintain clinical measurements within normal limits will improve Outcome: Progressing Goal: Will remain free from infection Outcome: Progressing Goal: Diagnostic test results will improve Outcome: Progressing Goal: Respiratory complications will improve Outcome: Progressing Goal: Cardiovascular complication will be avoided Outcome: Progressing   Problem: Activity: Goal: Risk for activity intolerance will decrease Outcome: Progressing   Problem: Nutrition: Goal: Adequate nutrition will be maintained Outcome: Progressing   Problem: Coping: Goal: Level of anxiety will decrease Outcome: Progressing   Problem: Elimination: Goal: Will not experience complications related to bowel motility Outcome: Progressing Goal: Will not experience complications related to urinary retention Outcome: Progressing   Problem: Pain Managment: Goal: General experience of comfort will improve Outcome: Progressing   Problem: Safety: Goal: Ability to remain free from injury will improve Outcome: Progressing   Problem: Skin Integrity: Goal: Risk for impaired skin integrity will decrease Outcome: Progressing   Problem: Fluid Volume: Goal: Hemodynamic stability will improve Outcome: Progressing   Problem: Clinical Measurements: Goal: Diagnostic test results will improve Outcome: Progressing Goal: Signs and symptoms of infection will decrease Outcome: Progressing   Problem:  Respiratory: Goal: Ability to maintain adequate ventilation will improve Outcome: Progressing

## 2022-11-05 ENCOUNTER — Inpatient Hospital Stay (HOSPITAL_COMMUNITY): Payer: Medicaid Other

## 2022-11-05 DIAGNOSIS — K831 Obstruction of bile duct: Secondary | ICD-10-CM | POA: Diagnosis not present

## 2022-11-05 DIAGNOSIS — R7881 Bacteremia: Secondary | ICD-10-CM

## 2022-11-05 DIAGNOSIS — A419 Sepsis, unspecified organism: Secondary | ICD-10-CM | POA: Diagnosis not present

## 2022-11-05 DIAGNOSIS — D709 Neutropenia, unspecified: Secondary | ICD-10-CM | POA: Diagnosis not present

## 2022-11-05 DIAGNOSIS — R6520 Severe sepsis without septic shock: Secondary | ICD-10-CM | POA: Diagnosis not present

## 2022-11-05 HISTORY — PX: IR EXCHANGE BILIARY DRAIN: IMG6046

## 2022-11-05 LAB — CBC WITH DIFFERENTIAL/PLATELET
Abs Immature Granulocytes: 0 10*3/uL (ref 0.00–0.07)
Basophils Absolute: 0 10*3/uL (ref 0.0–0.1)
Basophils Relative: 1 %
Eosinophils Absolute: 0 10*3/uL (ref 0.0–0.5)
Eosinophils Relative: 3 %
HCT: 24.9 % — ABNORMAL LOW (ref 36.0–46.0)
Hemoglobin: 7.9 g/dL — ABNORMAL LOW (ref 12.0–15.0)
Immature Granulocytes: 0 %
Lymphocytes Relative: 42 %
Lymphs Abs: 0.4 10*3/uL — ABNORMAL LOW (ref 0.7–4.0)
MCH: 29.4 pg (ref 26.0–34.0)
MCHC: 31.7 g/dL (ref 30.0–36.0)
MCV: 92.6 fL (ref 80.0–100.0)
Monocytes Absolute: 0.1 10*3/uL (ref 0.1–1.0)
Monocytes Relative: 9 %
Neutro Abs: 0.5 10*3/uL — ABNORMAL LOW (ref 1.7–7.7)
Neutrophils Relative %: 45 %
Platelets: 71 10*3/uL — ABNORMAL LOW (ref 150–400)
RBC: 2.69 MIL/uL — ABNORMAL LOW (ref 3.87–5.11)
RDW: 17.1 % — ABNORMAL HIGH (ref 11.5–15.5)
WBC: 1 10*3/uL — CL (ref 4.0–10.5)
nRBC: 0 % (ref 0.0–0.2)

## 2022-11-05 LAB — COMPREHENSIVE METABOLIC PANEL
ALT: 18 U/L (ref 0–44)
AST: 23 U/L (ref 15–41)
Albumin: 2.2 g/dL — ABNORMAL LOW (ref 3.5–5.0)
Alkaline Phosphatase: 179 U/L — ABNORMAL HIGH (ref 38–126)
Anion gap: 8 (ref 5–15)
BUN: <5 mg/dL — ABNORMAL LOW (ref 6–20)
CO2: 20 mmol/L — ABNORMAL LOW (ref 22–32)
Calcium: 7.2 mg/dL — ABNORMAL LOW (ref 8.9–10.3)
Chloride: 108 mmol/L (ref 98–111)
Creatinine, Ser: 0.47 mg/dL (ref 0.44–1.00)
GFR, Estimated: >60 mL/min (ref >60)
Glucose, Bld: 87 mg/dL (ref 70–99)
Potassium: 2.8 mmol/L — ABNORMAL LOW (ref 3.5–5.1)
Sodium: 136 mmol/L (ref 135–145)
Total Bilirubin: 1.1 mg/dL (ref 0.3–1.2)
Total Protein: 5.7 g/dL — ABNORMAL LOW (ref 6.5–8.1)

## 2022-11-05 LAB — MAGNESIUM: Magnesium: 2 mg/dL (ref 1.7–2.4)

## 2022-11-05 LAB — CULTURE, BLOOD (ROUTINE X 2): Culture: NO GROWTH

## 2022-11-05 MED ORDER — IOHEXOL 300 MG/ML  SOLN
50.0000 mL | Freq: Once | INTRAMUSCULAR | Status: AC | PRN
  Administered 2022-11-05: 10 mL

## 2022-11-05 MED ORDER — SODIUM CHLORIDE 0.9% FLUSH
5.0000 mL | Freq: Two times a day (BID) | INTRAVENOUS | Status: DC
  Administered 2022-11-05 – 2022-11-08 (×6): 5 mL

## 2022-11-05 MED ORDER — LIDOCAINE HCL 1 % IJ SOLN
INTRAMUSCULAR | Status: AC
  Filled 2022-11-05: qty 20

## 2022-11-05 MED ORDER — POTASSIUM CHLORIDE 10 MEQ/100ML IV SOLN
10.0000 meq | INTRAVENOUS | Status: AC
  Administered 2022-11-05 (×4): 10 meq via INTRAVENOUS
  Filled 2022-11-05 (×4): qty 100

## 2022-11-05 MED ORDER — HYDROMORPHONE HCL 1 MG/ML IJ SOLN
INTRAMUSCULAR | Status: DC | PRN
  Administered 2022-11-05: 1 mg via INTRAVENOUS

## 2022-11-05 MED ORDER — HYDROMORPHONE HCL 1 MG/ML IJ SOLN
INTRAMUSCULAR | Status: AC
  Filled 2022-11-05: qty 1

## 2022-11-05 MED ORDER — HEPARIN SODIUM (PORCINE) 5000 UNIT/ML IJ SOLN
5000.0000 [IU] | Freq: Three times a day (TID) | INTRAMUSCULAR | Status: DC
  Administered 2022-11-05 – 2022-11-06 (×3): 5000 [IU] via SUBCUTANEOUS
  Filled 2022-11-05 (×2): qty 1

## 2022-11-05 MED ORDER — LIDOCAINE HCL 1 % IJ SOLN
10.0000 mL | Freq: Once | INTRAMUSCULAR | Status: AC
  Administered 2022-11-05: 6 mL via INTRADERMAL

## 2022-11-05 NOTE — Procedures (Signed)
Interventional Radiology Procedure Note  Procedure: Exchange of internal/external biliary drainage catheter  Complications: None  Estimated Blood Loss: None  Findings: Cholangiogram shows good positioning and patency of indwelling biliary drain with no significant biliary ductal dilatation. 10 Fr left lobe internal/external biliary drainage catheter exchanged over guidewire for new catheter. Distal segment formed in duodenum. Attached to gravity drainage bag.  Jodi Marble. Fredia Sorrow, M.D Pager:  [phone]

## 2022-11-05 NOTE — Progress Notes (Signed)
Referring Physician(s): Ennever,P/Girguis,D  Supervising Physician: Irish Lack  Patient Status:  [facility] - In-pt  Chief Complaint:  Biliary obstruction/bacteremia/metastatic colon cancer to liver  Subjective: Pt doing ok this am; denies fever,HA,CP, dyspnea, cough, abd pain, N/V or bleeding; she does have chronic back pain   Allergies: Augmentin [amoxicillin-pot clavulanate], Irinotecan, Metronidazole, Vancomycin, Latex, Tape, and Wound dressing adhesive  Medications: Prior to Admission medications   Medication Sig Start Date End Date Taking? Authorizing Provider  alum & mag hydroxide-simeth suspension-nystatin suspension-diphenhydrAMINE liquid Take 5 mLs by mouth 4 (four) times daily as needed for mouth pain. 10/23/22  Yes Josph Macho, MD  baclofen (LIORESAL) 10 MG tablet Take 1 tablet (10 mg total) by mouth 3 (three) times daily. Patient taking differently: Take 10 mg by mouth as needed for muscle spasms. 08/28/22  Yes Ennever, Rose Phi, MD  dexamethasone (DECADRON) 4 MG tablet Take 2 tablets (8 mg total) by mouth daily. Start the day after chemotherapy for 2 days. Take with food. 10/17/22  Yes Josph Macho, MD  famotidine (PEPCID) 40 MG tablet Take 1 tablet (40 mg total) by mouth 2 (two) times daily. 08/07/22  Yes Ennever, Rose Phi, MD  HYDROmorphone (DILAUDID) 4 MG tablet Take 1 tablet (4 mg total) by mouth every 2 (two) hours as needed for severe pain. 10/08/22  Yes Hollice Espy, MD  LORazepam (ATIVAN) 0.5 MG tablet Place 1 tablet (0.5 mg total) under the tongue every 4 (four) hours as needed for anxiety. 09/21/22  Yes Josph Macho, MD  megestrol (MEGACE) 400 MG/10ML suspension Take 10 mLs (400 mg total) by mouth 2 (two) times daily. 09/21/22  Yes Ennever, Rose Phi, MD  meloxicam (MOBIC) 7.5 MG tablet Take 1 tablet (7.5 mg total) by mouth 2 (two) times daily. 11/01/22  Yes Pickenpack-Cousar, Arty Baumgartner, NP  morphine (MS CONTIN) 30 MG 12 hr tablet Take 1 tablet (30 mg  total) by mouth every 8 (eight) hours. 10/19/22  Yes Pickenpack-Cousar, Arty Baumgartner, NP  OLANZapine (ZYPREXA) 10 MG tablet Take 1 tablet (10 mg total) by mouth at bedtime. 09/21/22  Yes Ennever, Rose Phi, MD  ondansetron (ZOFRAN) 4 MG tablet Take 1 tablet (4 mg total) by mouth every 6 (six) hours. 10/08/22  Yes Hollice Espy, MD  scopolamine (TRANSDERM-SCOP) 1 MG/3DAYS Place one patch behind the ear every three days. 09/10/22  Yes Josph Macho, MD  Sodium Chloride Flush (NORMAL SALINE FLUSH) 0.9 % SOLN Flush abdominal drain with 5 cc sterile saline once daily 10/05/22  Yes Cayce Quezada K, PA-C     Vital Signs: BP (!) 99/49   Pulse 83   Temp 98.4 F (36.9 C) (Oral)   Resp 20   Ht 5\' 1"  (1.549 m)   Wt 168 lb 14 oz (76.6 kg)   SpO2 95%   BMI 31.91 kg/m   Physical Exam awake/alert; chest- CTA bilat; heart- RRR; abd- soft,+BS, ostomy intact, biliary drain intact, insertion site ok, not sig tender, green bile in drain bag; no sig LE edema  Imaging: CT ABDOMEN PELVIS WO CONTRAST  Result Date: 11/02/2022 CLINICAL DATA:  Sepsis EXAM: CT ABDOMEN AND PELVIS WITHOUT CONTRAST TECHNIQUE: Multidetector CT imaging of the abdomen and pelvis was performed following the standard protocol without IV contrast. RADIATION DOSE REDUCTION: This exam was performed according to the departmental dose-optimization program which includes automated exposure control, adjustment of the mA and/or kV according to patient size and/or use of iterative reconstruction technique. COMPARISON:  CT  abdomen and pelvis 10/03/2022 FINDINGS: Lower chest: No acute abnormality. Indeterminate nodules measuring up to 3 mm are similar to prior. Hepatobiliary: Large lobulated heterogenously hypoattenuating mass in the right hepatic dome is unchanged in size and measures approximately 11.4 cm. Unchanged hepatic cyst in the inferior right hepatic lobe. New percutaneous biliary drainage catheter. Decreased dilation of the intrahepatic bile  ducts. Cholelithiasis with similar gallbladder wall thickening. Similar soft tissue in the porta hepatis which could represent lymphadenopathy or tumor. Pancreas: Unremarkable. Spleen: The spleen is mildly enlarged measuring 13.6 cm in craniocaudal dimension. Adrenals/Urinary Tract: Stable adrenal glands. No urinary calculi or hydronephrosis. Contrast within the bladder. Stomach/Bowel: Normal caliber large and small bowel. Bowel wall thickening of the cecum and right colon compatible with known primary colon cancer. Loop ileostomy in the right anterior abdomen. Vascular/Lymphatic: Unchanged porta hepatis lymphadenopathy measuring up to 19 mm in short axis on series 2/image 24). 9 mm ileocolic node is unchanged. No acute vascular abnormality Reproductive: Uterus and bilateral adnexa are unremarkable. Other: Small volume free fluid in the pelvis. No free intraperitoneal air. Musculoskeletal: Sclerotic lesion in the L1 vertebral body with superior endplate compression fracture is unchanged. No acute fracture. IMPRESSION: 1. New percutaneous biliary drainage catheter with decreased dilation of the intrahepatic bile ducts. 2. Unchanged size of the large hepatic dome metastasis. Similar porta hepatis lymphadenopathy. 3. Unchanged wall thickening of the cecum and right colon compatible with known primary colon cancer. 4. Cholelithiasis and gallbladder wall thickening similar to prior 5. Unchanged sclerotic lesion in the L1 vertebral body with pathologic superior endplate compression fracture. 6. Small volume free fluid in the pelvis. 7. Mild splenomegaly. 8. Indeterminate pulmonary nodules measuring up to 3 mm are similar to prior. Electronically Signed   By: Minerva Fester M.D.   On: 11/02/2022 20:03   CT Angio Chest PE W/Cm &/Or Wo Cm  Result Date: 11/02/2022 CLINICAL DATA:  History of metastatic colon cancer, presenting with increased drowsiness. EXAM: CT ANGIOGRAPHY CHEST WITH CONTRAST TECHNIQUE: Multidetector CT  imaging of the chest was performed using the standard protocol during bolus administration of intravenous contrast. Multiplanar CT image reconstructions and MIPs were obtained to evaluate the vascular anatomy. RADIATION DOSE REDUCTION: This exam was performed according to the departmental dose-optimization program which includes automated exposure control, adjustment of the mA and/or kV according to patient size and/or use of iterative reconstruction technique. CONTRAST:  OMNIPAQUE IOHEXOL 350 MG/ML SOLN COMPARISON:  Chest CT with contrast, dated April 05, 2021 and abdomen and pelvis CT dated Oct 03, 2022 FINDINGS: Cardiovascular: A right-sided venous Port-A-Cath is in place. The thoracic aorta is normal in appearance. Satisfactory opacification of the pulmonary arteries to the segmental level. No evidence of pulmonary embolism. Normal heart size. No pericardial effusion. Mediastinum/Nodes: No enlarged mediastinal, hilar, or axillary lymph nodes. A 7 mm thyroid nodule is seen within the lower pole of the left lobe of the thyroid gland. The trachea and esophagus demonstrate no significant findings. Lungs/Pleura: Mild lingular and posterior right basilar atelectasis is seen. There is no evidence of acute infiltrate, pleural effusion or pneumothorax. Upper Abdomen: There is diffuse fatty infiltration of the liver parenchyma. An 11.2 cm x 10.7 cm x 11.2 cm area of heterogeneous low attenuation is seen occupying the majority of the posterior aspect of the right lobe of the liver. A stable 1.6 cm x 1.7 cm x 1.8 cm area of parenchymal low attenuation is seen within the anterolateral aspect of an enlarged spleen. A percutaneous biliary drainage catheter  is in place. A 1.6 cm gallstone is seen within a contracted gallbladder. Musculoskeletal: The L1 vertebral body is sclerotic and heterogeneous in appearance. This is seen on the prior abdomen and pelvis CT. Review of the MIP images confirms the above findings.  IMPRESSION: 1. No evidence of pulmonary embolism. 2. Mild lingular and posterior right basilar atelectasis. 3. Large hepatic mass, consistent with metastatic disease. 4. Cholelithiasis. 5. Percutaneous biliary drainage catheter in place. 6. Findings consistent with osseous metastatic disease involving the L1 vertebral body. 7. 7 mm left thyroid nodule. No follow-up imaging is recommended. This follows ACR consensus guidelines: Managing Incidental Thyroid Nodules Detected on Imaging: White Paper of the ACR Incidental Thyroid Findings Committee. J Am Coll Radiol 2015; 12:143-150. Electronically Signed   By: Aram Candela M.D.   On: 11/02/2022 18:41   DG Chest 2 View  Result Date: 11/02/2022 CLINICAL DATA:  Shortness of breath. History of metastatic colon cancer. EXAM: CHEST - 2 VIEW COMPARISON:  08/18/2022 FINDINGS: No consolidation, pneumothorax or effusion. No edema. Normal cardiopericardial silhouette. Degenerative changes of the spine on lateral view. Stable right upper chest port with tip along the central SVC above the right atrium. Separate catheter in the upper abdomen. IMPRESSION: No acute cardiopulmonary disease.  Chest port Electronically Signed   By: Karen Kays M.D.   On: 11/02/2022 14:24    Labs:  CBC: Recent Labs    10/31/22 0815 11/02/22 1438 11/02/22 1449 11/04/22 0630 11/05/22 0556  WBC 2.0* 0.8*  --  1.3* 1.0*  HGB 9.6* 10.0* 9.2* 7.5* 7.9*  HCT 29.8* 31.9* 27.0* 24.5* 24.9*  PLT 145* 158  --  91* 71*    COAGS: Recent Labs    03/15/22 0801 03/29/22 0820 10/02/22 1402 11/02/22 1438  INR 1.0 1.0 1.2 1.5*  APTT  --   --   --  29    BMP: Recent Labs    10/31/22 0815 11/02/22 1438 11/02/22 1449 11/04/22 0630 11/05/22 0556  NA 136 132* 136 134* 136  K 3.2* 3.1* 3.3* 2.8* 2.8*  CL 107 102  --  106 108  CO2 21* 20*  --  21* 20*  GLUCOSE 105* 120*  --  87 87  BUN 6 10  --  8 <5*  CALCIUM 8.4* 8.0*  --  7.0* 7.2*  CREATININE 0.47 0.80  --  0.49 0.47   GFRNONAA >60 >60  --  >60 >60    LIVER FUNCTION TESTS: Recent Labs    10/31/22 0815 11/02/22 1438 11/04/22 0630 11/05/22 0556  BILITOT 1.1 1.7* 1.4* 1.1  AST 30 47* 25 23  ALT 24 29 21 18   ALKPHOS 217* 211* 133* 179*  PROT 7.1 7.2 5.0* 5.7*  ALBUMIN 2.7* 2.8* 2.1* 2.2*    Assessment and Plan: 57 yo female with hx met colon ca to liver/biliary obstruction with left I/E biliary drain placement 10/03/22; known path fx L1; admitted recently with fever,chills, klebsiella bacteremia; afebrile, BP a little soft, WBC 1.0, HGB 7.9, PLTS 71K, K 2.8, creat 0.47, t bili 1.1; on IV rocephin; was on OP IR schedule for cholangiogram on 6/14; will plan to do exchange today; will review prior imaging with NIR rad to eval for poss L1 KP but this would not be done even if candidate until bacteremia resolved   Electronically Signed: D. Jeananne Rama, PA-C 11/05/2022, 9:32 AM   I spent a total of 20 minutes at the the patient's bedside AND on the patient's hospital floor or unit, greater  than 50% of which was counseling/coordinating care for biliary drain     Patient ID: Rebecca Bullock, female   DOB: Sep 28, 1965, 57 y.o.   MRN: [id number]

## 2022-11-05 NOTE — Progress Notes (Signed)
Unfortunately, Rebecca Bullock has Klebsiella in the blood.  I would like to hope that this is good to be sensitive to most antibiotics.  She says she feels a lot better.  She is not having any nausea or vomiting.  There is still the back pain.  I do not know if she would be a candidate for kyphoplasty.  I am a little surprised that her white cell count dropped a little bit.  Today, her white cell count is 1.  Hemoglobin 7.9.  Platelet count 71,000.  I did have her on Neupogen.  I thought we did have to transfuse her.  Hemoglobin is 7.9.  I am okay with this and not having to transfuse her.  There is no bleeding.  She has had no fever.  Her vital signs are temperature 99.  Pulse 80.  Blood pressure 108/54.  Her head and neck exam shows no oral mucositis.  There is no adenopathy in the neck.  Lungs are clear bilaterally.  Cardiac exam regular rate and rhythm.  Abdomen soft.  She has a colostomy.  She has a biliary drainage catheter.  There is no fluid wave.  There is no obvious abdominal mass.  There is no palpable hepatosplenomegaly.  Extremities shows no clubbing, cyanosis or edema.  I hate the fact that Rebecca Bullock has a Klebsiella in her blood.  Again, I would have to believe that this is going to be relatively sensitive to most antibiotics.  Again we have to continue her on the Neupogen I am just a little surprised that her white cell count is not better.  We clearly want to make an adjustment with her chemotherapy dosing when we treat her again.  We will hold off on a transfusion for right now.  I do appreciate the incredible care that she is getting from everybody down in the ICU.    Rebecca Bach, MD  Jeri Modena 29:11

## 2022-11-05 NOTE — Progress Notes (Signed)
Progress Note    Rebecca Bullock   ZOX:[id number]  DOB: 1965/11/20  DOA: 11/02/2022     2 PCP: Rebecca Longs, PA-C  Initial CC: fever, chills  Hospital Course: Rebecca Bullock is a 57 yo female with PMH Stage IV colorectal cancer (liver mets), anemia who presented with fevers, chills, shortness of breath.   She has undergone laparoscopic loop ileostomy on 08/21/2022.  She was recently hospitalized from 10/02/2022 until 10/08/2022 due to jaundice and found to have bile duct obstruction.  She underwent left hepatic biliary drain placement on 10/03/2022 with IR.  She had recent treatment of chemo on 10/31/2022. On admission, she was found to be tachycardic, tachypneic, hypotensive.  She was neutropenic.  Urinalysis was negative for signs of infection.  Blood cultures were also obtained.  She underwent imaging studies.  CXR unremarkable.  CT chest/abdomen/pelvis was obtained which was negative for PE and did not show signs of underlying acute infection.   She was started on broad-spectrum antibiotics and admitted for further workup.  Interval History:  No events overnight. Still feels good aside from back pain and hoping to go home when able.   Assessment and Plan: * Severe sepsis (HCC)-resolved as of 11/05/2022 - Tachycardia, tachypnea, severe neutropenia, lactic acidosis -Source initially unknown on admission but with blood cultures growing Klebsiella and recent biliary drain placement, suspect GI source at this time -Sensitivities reviewed from cultures.  Will plan to transition to cefadroxil to complete course at discharge.  Continue Rocephin while in hospital  Severe neutropenia (HCC) - ANC < 500 on admission - continue neutropenic precautions - continue granix - continue treatment of Klebsiella  -Will discuss goal ANC with oncology for discharge purposes  Bacteremia due to Klebsiella pneumoniae - see sepsis/biliary obstruction -Suspect GI source at this time - IR performed cholangiogram on  11/05/2022 with catheter exchange - Continue Rocephin  Bile duct obstruction, intrahepatic - s/p left hepatic biliary drain placement on 10/03/2022 -Blood culture now growing Klebsiella.  Concern for infection associated with drain -Continue antibiotics - Evaluated by IR.  Patient underwent cholangiogram with biliary drainage catheter exchange on 11/05/2022.  Cholangiogram did show good positioning and patency of biliary drain  Colon cancer metastasized to liver (HCC) - on bevacizumab, irinotecan, fluorouracil; recent treatment 10/31/22 - nadir for fluorouracil 9-14 days  - follows with Dr. Myna Hidalgo   Lumbar compression fracture [facility]) - Patient has known L1 pathologic compression fracture.  On chronic opioids at home for control.  Oncology interested in IR evaluation for possible kyphoplasty; consult placed -Continue pain control - No plans for kyphoplasty while bacteremic per IR  Hypomagnesemia - replete as needed  Hypokalemia - replete as needed   Old records reviewed in assessment of this patient  Antimicrobials: Cefepime 6/7 x 1 Vanc 6/7 x 1 Meropenem 6/8 x 1 Rocephin 6/8 >> current   DVT prophylaxis:  heparin injection 5,000 Units Start: 11/05/22 2200   Code Status:   Code Status: Full Code  Mobility Assessment (last 72 hours)     Mobility Assessment   No documentation.           Barriers to discharge:  Disposition Plan:  Home Status is: Inpt  Objective: Blood pressure (!) 105/58, pulse 71, temperature 98.4 F (36.9 C), temperature source Oral, resp. rate (!) 22, height 5\' 1"  (1.549 m), weight 76.6 kg, SpO2 97 %.  Examination:  Physical Exam Constitutional:      General: She is not in acute distress.    Appearance: She  is well-developed. She is not ill-appearing.  HENT:     Head: Normocephalic and atraumatic.     Mouth/Throat:     Mouth: Mucous membranes are moist.  Eyes:     Extraocular Movements: Extraocular movements intact.  Cardiovascular:      Rate and Rhythm: Normal rate and regular rhythm.  Pulmonary:     Effort: Pulmonary effort is normal. No respiratory distress.     Breath sounds: Normal breath sounds. No wheezing.  Abdominal:     General: Bowel sounds are normal. There is no distension.     Palpations: Abdomen is soft.     Tenderness: There is no abdominal tenderness.     Comments: Ostomy bag in place, no issues noted. Biliary drain in place with fluid noted in bag  Musculoskeletal:        General: No swelling. Normal range of motion.     Cervical back: Normal range of motion.  Skin:    General: Skin is warm and dry.     Coloration: Skin is not jaundiced.     Findings: No bruising.  Neurological:     General: No focal deficit present.     Mental Status: She is alert.  Psychiatric:        Mood and Affect: Mood normal.      Consultants:  Oncology IR  Procedures:  11/05/2022: Cholangiogram and biliary catheter exchange  Data Reviewed: Results for orders placed or performed during the hospital encounter of 11/02/22 (from the past 24 hour(s))  CBC with Differential/Platelet     Status: Abnormal   Collection Time: 11/05/22  5:56 AM  Result Value Ref Range   WBC 1.0 (LL) 4.0 - 10.5 K/uL   RBC 2.69 (L) 3.87 - 5.11 MIL/uL   Hemoglobin 7.9 (L) 12.0 - 15.0 g/dL   HCT 16.1 (L) 09.6 - 04.5 %   MCV 92.6 80.0 - 100.0 fL   MCH 29.4 26.0 - 34.0 pg   MCHC 31.7 30.0 - 36.0 g/dL   RDW 40.9 (H) 81.1 - 91.4 %   Platelets 71 (L) 150 - 400 K/uL   nRBC 0.0 0.0 - 0.2 %   Neutrophils Relative % 45 %   Neutro Abs 0.5 (L) 1.7 - 7.7 K/uL   Lymphocytes Relative 42 %   Lymphs Abs 0.4 (L) 0.7 - 4.0 K/uL   Monocytes Relative 9 %   Monocytes Absolute 0.1 0.1 - 1.0 K/uL   Eosinophils Relative 3 %   Eosinophils Absolute 0.0 0.0 - 0.5 K/uL   Basophils Relative 1 %   Basophils Absolute 0.0 0.0 - 0.1 K/uL   Immature Granulocytes 0 %   Abs Immature Granulocytes 0.00 0.00 - 0.07 K/uL  Comprehensive metabolic panel     Status: Abnormal    Collection Time: 11/05/22  5:56 AM  Result Value Ref Range   Sodium 136 135 - 145 mmol/L   Potassium 2.8 (L) 3.5 - 5.1 mmol/L   Chloride 108 98 - 111 mmol/L   CO2 20 (L) 22 - 32 mmol/L   Glucose, Bld 87 70 - 99 mg/dL   BUN <5 (L) 6 - 20 mg/dL   Creatinine, Ser 7.82 0.44 - 1.00 mg/dL   Calcium 7.2 (L) 8.9 - 10.3 mg/dL   Total Protein 5.7 (L) 6.5 - 8.1 g/dL   Albumin 2.2 (L) 3.5 - 5.0 g/dL   AST 23 15 - 41 U/L   ALT 18 0 - 44 U/L   Alkaline Phosphatase 179 (H) 38 -  126 U/L   Total Bilirubin 1.1 0.3 - 1.2 mg/dL   GFR, Estimated >16 >10 mL/min   Anion gap 8 5 - 15  Magnesium     Status: None   Collection Time: 11/05/22  5:56 AM  Result Value Ref Range   Magnesium 2.0 1.7 - 2.4 mg/dL    I have reviewed pertinent nursing notes, vitals, labs, and images as necessary. I have ordered labwork to follow up on as indicated.  I have reviewed the last notes from staff over past 24 hours. I have discussed patient's care plan and test results with nursing staff, CM/SW, and other staff as appropriate.  Time spent: Greater than 50% of the 55 minute visit was spent in counseling/coordination of care for the patient as laid out in the A&P.   LOS: 2 days   Lewie Chamber, MD [facility] 11/05/2022, 2:04 PM

## 2022-11-05 NOTE — Progress Notes (Signed)
Chaplain received a referral to assist Lecia with preparing advance directives documents.  Kenda had already filled out the paperwork, assigning her daughter as her HCPOA.  The documents need to be notarized, but she is not feeling well and asked if we could do it tomorrow.  Chaplain will follow up tomorrow.  Chaplain asked about other needs for support, but Doriana stated that she was doing okay for now.  [address], Bcc Pager, [phone]

## 2022-11-06 DIAGNOSIS — R6520 Severe sepsis without septic shock: Secondary | ICD-10-CM | POA: Diagnosis not present

## 2022-11-06 DIAGNOSIS — K831 Obstruction of bile duct: Secondary | ICD-10-CM | POA: Diagnosis not present

## 2022-11-06 DIAGNOSIS — A419 Sepsis, unspecified organism: Secondary | ICD-10-CM | POA: Diagnosis not present

## 2022-11-06 DIAGNOSIS — D709 Neutropenia, unspecified: Secondary | ICD-10-CM | POA: Diagnosis not present

## 2022-11-06 LAB — CBC WITH DIFFERENTIAL/PLATELET
Abs Immature Granulocytes: 0 10*3/uL (ref 0.00–0.07)
Basophils Absolute: 0 10*3/uL (ref 0.0–0.1)
Basophils Relative: 1 %
Eosinophils Absolute: 0 10*3/uL (ref 0.0–0.5)
Eosinophils Relative: 3 %
HCT: 26.3 % — ABNORMAL LOW (ref 36.0–46.0)
Hemoglobin: 8.3 g/dL — ABNORMAL LOW (ref 12.0–15.0)
Immature Granulocytes: 0 %
Lymphocytes Relative: 37 %
Lymphs Abs: 0.3 10*3/uL — ABNORMAL LOW (ref 0.7–4.0)
MCH: 28.4 pg (ref 26.0–34.0)
MCHC: 31.6 g/dL (ref 30.0–36.0)
MCV: 90.1 fL (ref 80.0–100.0)
Monocytes Absolute: 0.1 10*3/uL (ref 0.1–1.0)
Monocytes Relative: 12 %
Neutro Abs: 0.4 10*3/uL — CL (ref 1.7–7.7)
Neutrophils Relative %: 47 %
Platelets: 61 10*3/uL — ABNORMAL LOW (ref 150–400)
RBC: 2.92 MIL/uL — ABNORMAL LOW (ref 3.87–5.11)
RDW: 17 % — ABNORMAL HIGH (ref 11.5–15.5)
WBC: 0.8 10*3/uL — CL (ref 4.0–10.5)
nRBC: 0 % (ref 0.0–0.2)

## 2022-11-06 LAB — MAGNESIUM: Magnesium: 1.7 mg/dL (ref 1.7–2.4)

## 2022-11-06 LAB — COMPREHENSIVE METABOLIC PANEL
ALT: 16 U/L (ref 0–44)
AST: 18 U/L (ref 15–41)
Albumin: 2.4 g/dL — ABNORMAL LOW (ref 3.5–5.0)
Alkaline Phosphatase: 195 U/L — ABNORMAL HIGH (ref 38–126)
Anion gap: 10 (ref 5–15)
BUN: <5 mg/dL — ABNORMAL LOW (ref 6–20)
CO2: 21 mmol/L — ABNORMAL LOW (ref 22–32)
Calcium: 7.8 mg/dL — ABNORMAL LOW (ref 8.9–10.3)
Chloride: 103 mmol/L (ref 98–111)
Creatinine, Ser: 0.51 mg/dL (ref 0.44–1.00)
GFR, Estimated: >60 mL/min (ref >60)
Glucose, Bld: 102 mg/dL — ABNORMAL HIGH (ref 70–99)
Potassium: 2.9 mmol/L — ABNORMAL LOW (ref 3.5–5.1)
Sodium: 134 mmol/L — ABNORMAL LOW (ref 135–145)
Total Bilirubin: 1.6 mg/dL — ABNORMAL HIGH (ref 0.3–1.2)
Total Protein: 6.1 g/dL — ABNORMAL LOW (ref 6.5–8.1)

## 2022-11-06 MED ORDER — SODIUM CHLORIDE 0.9% FLUSH
10.0000 mL | Freq: Two times a day (BID) | INTRAVENOUS | Status: DC
  Administered 2022-11-06 – 2022-11-08 (×4): 10 mL

## 2022-11-06 MED ORDER — SODIUM CHLORIDE 0.9% FLUSH: 10.0000 mL | INTRAVENOUS | Status: DC | PRN

## 2022-11-06 MED ORDER — ALTEPLASE 2 MG IJ SOLR
2.0000 mg | Freq: Once | INTRAMUSCULAR | Status: AC
  Administered 2022-11-06: 2 mg
  Filled 2022-11-06: qty 2

## 2022-11-06 MED ORDER — TIZANIDINE HCL 4 MG PO TABS
2.0000 mg | ORAL_TABLET | Freq: Once | ORAL | Status: AC
  Administered 2022-11-06: 2 mg via ORAL
  Filled 2022-11-06: qty 1

## 2022-11-06 NOTE — Progress Notes (Signed)
Unable to draw lab work from patient port this am. Phlebotomy made aware and stated that they would return to draw the labs as soon as they could.  Will cont to monitor.

## 2022-11-06 NOTE — Progress Notes (Signed)
Progress Note    Rebecca Bullock   ZOX:[id number]  DOB: Nov 03, 1965  DOA: 11/02/2022     3 PCP: Rebecca Longs, PA-C  Initial CC: fever, chills  Hospital Course: [name] is a 57 yo female with PMH Stage IV colorectal cancer (liver mets), anemia who presented with fevers, chills, shortness of breath.   She has undergone laparoscopic loop ileostomy on 08/21/2022.  She was recently hospitalized from 10/02/2022 until 10/08/2022 due to jaundice and found to have bile duct obstruction.  She underwent left hepatic biliary drain placement on 10/03/2022 with IR.  She had recent treatment of chemo on 10/31/2022. On admission, she was found to be tachycardic, tachypneic, hypotensive.  She was neutropenic.  Urinalysis was negative for signs of infection.  Blood cultures were also obtained.  She underwent imaging studies.  CXR unremarkable.  CT chest/abdomen/pelvis was obtained which was negative for PE and did not show signs of underlying acute infection.   She was started on broad-spectrum antibiotics and admitted for further workup.  Interval History:  No events overnight.  Still feeling okay and hoping to go home when able.  She understands we are waiting on goal ANC prior to discharge.  Remains afebrile.  Assessment and Plan: * Severe sepsis (HCC)-resolved as of 11/05/2022 - Tachycardia, tachypnea, severe neutropenia, lactic acidosis -Source initially unknown on admission but with blood cultures growing Klebsiella and recent biliary drain placement, suspect GI source at this time -Sensitivities reviewed from cultures.  Will plan to transition to cefadroxil to complete course at discharge.  Continue Rocephin while in hospital  Severe neutropenia (HCC) - ANC < 500 on admission - continue neutropenic precautions - continue granix - continue treatment of Klebsiella  - goal ANC>1500 per oncology prior to discharge   Bacteremia due to Klebsiella pneumoniae - see sepsis/biliary obstruction -Suspect GI  source at this time - IR performed cholangiogram on 11/05/2022 with catheter exchange - Continue Rocephin  Bile duct obstruction, intrahepatic - s/p left hepatic biliary drain placement on 10/03/2022 -Blood culture now growing Klebsiella.  Concern for infection associated with drain -Continue antibiotics - Evaluated by IR.  Patient underwent cholangiogram with biliary drainage catheter exchange on 11/05/2022.  Cholangiogram did show good positioning and patency of biliary drain  Colon cancer metastasized to liver (HCC) - on bevacizumab, irinotecan, fluorouracil; recent treatment 10/31/22 - nadir for fluorouracil 9-14 days  - follows with Dr. Myna Bullock   Lumbar compression fracture [facility]) - Patient has known L1 pathologic compression fracture.  On chronic opioids at home for control.  Oncology interested in IR evaluation for possible kyphoplasty; consult placed -Continue pain control - No plans for kyphoplasty while bacteremic per IR  Hypomagnesemia - replete as needed  Hypokalemia - replete as needed   Old records reviewed in assessment of this patient  Antimicrobials: Cefepime 6/7 x 1 Vanc 6/7 x 1 Meropenem 6/8 x 1 Rocephin 6/8 >> current   DVT prophylaxis:  heparin injection 5,000 Units Start: 11/05/22 2200   Code Status:   Code Status: Full Code  Mobility Assessment (last 72 hours)     Mobility Assessment     Row Name 11/06/22 0850 11/05/22 2100 11/05/22 1807       Does patient have an order for bedrest or is patient medically unstable No - Continue assessment No - Continue assessment No - Continue assessment     What is the highest level of mobility based on the progressive mobility assessment? Level 6 (Walks independently in room and hall) -  Balance while walking in room without assist - Complete Level 6 (Walks independently in room and hall) - Balance while walking in room without assist - Complete Level 6 (Walks independently in room and hall) - Balance while walking in  room without assist - Complete              Barriers to discharge:  Disposition Plan:  Home Status is: Inpt  Objective: Blood pressure 106/72, pulse 82, temperature 98.3 F (36.8 C), temperature source Oral, resp. rate 16, height 5\' 1"  (1.549 m), weight 74.6 kg, SpO2 97 %.  Examination:  Physical Exam Constitutional:      General: She is not in acute distress.    Appearance: She is well-developed. She is not ill-appearing.  HENT:     Head: Normocephalic and atraumatic.     Mouth/Throat:     Mouth: Mucous membranes are moist.  Eyes:     Extraocular Movements: Extraocular movements intact.  Cardiovascular:     Rate and Rhythm: Normal rate and regular rhythm.  Pulmonary:     Effort: Pulmonary effort is normal. No respiratory distress.     Breath sounds: Normal breath sounds. No wheezing.  Abdominal:     General: Bowel sounds are normal. There is no distension.     Palpations: Abdomen is soft.     Tenderness: There is no abdominal tenderness.     Comments: Ostomy bag in place, no issues noted. Biliary drain in place with fluid noted in bag  Musculoskeletal:        General: No swelling. Normal range of motion.     Cervical back: Normal range of motion.  Skin:    General: Skin is warm and dry.     Coloration: Skin is not jaundiced.     Findings: No bruising.  Neurological:     General: No focal deficit present.     Mental Status: She is alert.  Psychiatric:        Mood and Affect: Mood normal.      Consultants:  Oncology IR  Procedures:  11/05/2022: Cholangiogram and biliary catheter exchange  Data Reviewed: Results for orders placed or performed during the hospital encounter of 11/02/22 (from the past 24 hour(s))  CBC with Differential/Platelet     Status: Abnormal   Collection Time: 11/06/22  9:42 AM  Result Value Ref Range   WBC 0.8 (LL) 4.0 - 10.5 K/uL   RBC 2.92 (L) 3.87 - 5.11 MIL/uL   Hemoglobin 8.3 (L) 12.0 - 15.0 g/dL   HCT 40.9 (L) 81.1 - 91.4 %    MCV 90.1 80.0 - 100.0 fL   MCH 28.4 26.0 - 34.0 pg   MCHC 31.6 30.0 - 36.0 g/dL   RDW 78.2 (H) 95.6 - 21.3 %   Platelets 61 (L) 150 - 400 K/uL   nRBC 0.0 0.0 - 0.2 %   Neutrophils Relative % 47 %   Neutro Abs 0.4 (LL) 1.7 - 7.7 K/uL   Lymphocytes Relative 37 %   Lymphs Abs 0.3 (L) 0.7 - 4.0 K/uL   Monocytes Relative 12 %   Monocytes Absolute 0.1 0.1 - 1.0 K/uL   Eosinophils Relative 3 %   Eosinophils Absolute 0.0 0.0 - 0.5 K/uL   Basophils Relative 1 %   Basophils Absolute 0.0 0.0 - 0.1 K/uL   WBC Morphology DOHLE BODIES    Immature Granulocytes 0 %   Abs Immature Granulocytes 0.00 0.00 - 0.07 K/uL  Comprehensive metabolic panel  Status: Abnormal   Collection Time: 11/06/22  9:42 AM  Result Value Ref Range   Sodium 134 (L) 135 - 145 mmol/L   Potassium 2.9 (L) 3.5 - 5.1 mmol/L   Chloride 103 98 - 111 mmol/L   CO2 21 (L) 22 - 32 mmol/L   Glucose, Bld 102 (H) 70 - 99 mg/dL   BUN <5 (L) 6 - 20 mg/dL   Creatinine, Ser 3.24 0.44 - 1.00 mg/dL   Calcium 7.8 (L) 8.9 - 10.3 mg/dL   Total Protein 6.1 (L) 6.5 - 8.1 g/dL   Albumin 2.4 (L) 3.5 - 5.0 g/dL   AST 18 15 - 41 U/L   ALT 16 0 - 44 U/L   Alkaline Phosphatase 195 (H) 38 - 126 U/L   Total Bilirubin 1.6 (H) 0.3 - 1.2 mg/dL   GFR, Estimated >40 >10 mL/min   Anion gap 10 5 - 15  Magnesium     Status: None   Collection Time: 11/06/22  9:42 AM  Result Value Ref Range   Magnesium 1.7 1.7 - 2.4 mg/dL    I have reviewed pertinent nursing notes, vitals, labs, and images as necessary. I have ordered labwork to follow up on as indicated.  I have reviewed the last notes from staff over past 24 hours. I have discussed patient's care plan and test results with nursing staff, CM/SW, and other staff as appropriate.  Time spent: Greater than 50% of the 55 minute visit was spent in counseling/coordination of care for the patient as laid out in the A&P.   LOS: 3 days   Lewie Chamber, MD [facility] 11/06/2022, 3:41 PM

## 2022-11-06 NOTE — Consult Note (Addendum)
WOC Nurse ostomy consult note Consult requested to assist with ostomy pouch change.  Pt is familiar to [facility] team from recent admission.  Ostomy was performed 3/24. She states her friend usually performs pouch changes, since the patient has been feeling poorly. She has also been followed by the outpatient ostomy clinic for assistance.  Ileostomy stoma is red and edematous, 1 3/4 inches, above skin level. There is a small bleeding area to 6:00 o'clock of the stoma, and from 9:00 o'clock to 11:00 o'clock the outer stoma has a yellow linear lesion of unknown etiology; possibly from the ring around the 2 piece pouching system related to the stomal edema.  Pt has her own supplies in the room and has been using a barrier ring and 2 piece pouching system. Use supplies: barrier ring [employer] # [id number], wafer [employer] # 2, pouch [employer] all the products and provided her with another set of supplies for her use when necessary in a bag at the bedside.  50cc liquid brown stool in the pouch.  She plans to have further follow-up at the outpatient ostomy clinic next week. Please re-consult if further assistance is needed.  Thank-you,  Cammie Mcgee MSN, RN, CWOCN, [place], CNS [phone]

## 2022-11-06 NOTE — TOC CM/SW Note (Signed)
Transition of Care [facility]) - Inpatient Brief Assessment   Patient Details  Name: Rebecca Bullock MRN: [id number] Date of Birth: [birth year]  [facility]) CM/SW Contact:    Otelia Santee, LCSW Phone Number: 11/06/2022, 10:12 AM   Clinical Narrative: No current TOC needs identified. Please consult TOC should need arise.    Transition of Care Asessment: Insurance and Status: Insurance coverage has been reviewed Patient has primary care physician: Yes Home environment has been reviewed: Apartment Prior level of function:: Independent Prior/Current Home Services: No current home services Social Determinants of Health Reivew: SDOH reviewed no interventions necessary Readmission risk has been reviewed: Yes Transition of care needs: no transition of care needs at this time

## 2022-11-06 NOTE — Progress Notes (Signed)
Rebecca Bullock is now on 5 W.  She is doing okay.  Unfortunately, there is no lab work back yet this morning.  She has a biliary catheter exchange. This was somewhat tender for her.  Otherwise, she seems to be looking pretty good.  She is eating okay.  Her colostomy is functioning well.  She is on Rocephin for the Klebsiella.  She continues on the Granix.  Again we had to see what her white cell count is.  She has had no bleeding.  She has had no fever.  There is been no cough or shortness of breath.  She is out of bed.  Her vital signs are temperature 98.4.  Pulse 85.  Blood pressure 123/70.  Her head neck exam shows no ocular or oral lesions.  There are no palpable cervical or supraclavicular lymph nodes.  Lungs are clear bilaterally.  Cardiac exam regular rate and rhythm.  Abdomen shows a biliary catheter in place.  The colostomy is intact.  She has no abdominal distention.  There is no guarding or rebound tenderness.  Extremity shows no clubbing, cyanosis or edema.  I think we have to wait to see what the white cell count is.  I would probably feel best for her ANC to be greater than 1500 if she is to be discharged.  We need to see what antibiotics the Klebsiella are sensitive to so we get on oral antibiotics.  Again, Interventional Radiology will not do any kyphoplasty until this bacteremia gets resolved.  I think it she could have kyphoplasty to the L1 vertebral body, this will make her life a lot better as she will have much less pain.  I do appreciate the care that she will receive from the wonderful staff upon 5 W.  Christin Bach, MD  Colossians 3:23

## 2022-11-07 ENCOUNTER — Other Ambulatory Visit (HOSPITAL_BASED_OUTPATIENT_CLINIC_OR_DEPARTMENT_OTHER): Payer: Self-pay

## 2022-11-07 DIAGNOSIS — A419 Sepsis, unspecified organism: Secondary | ICD-10-CM | POA: Diagnosis not present

## 2022-11-07 DIAGNOSIS — R6520 Severe sepsis without septic shock: Secondary | ICD-10-CM | POA: Diagnosis not present

## 2022-11-07 LAB — CBC WITH DIFFERENTIAL/PLATELET
Abs Immature Granulocytes: 0.01 10*3/uL (ref 0.00–0.07)
Basophils Absolute: 0 10*3/uL (ref 0.0–0.1)
Basophils Relative: 1 %
Eosinophils Absolute: 0 10*3/uL (ref 0.0–0.5)
Eosinophils Relative: 3 %
HCT: 23.5 % — ABNORMAL LOW (ref 36.0–46.0)
Hemoglobin: 7.6 g/dL — ABNORMAL LOW (ref 12.0–15.0)
Immature Granulocytes: 1 %
Lymphocytes Relative: 51 %
Lymphs Abs: 0.4 10*3/uL — ABNORMAL LOW (ref 0.7–4.0)
MCH: 29 pg (ref 26.0–34.0)
MCHC: 32.3 g/dL (ref 30.0–36.0)
MCV: 89.7 fL (ref 80.0–100.0)
Monocytes Absolute: 0.1 10*3/uL (ref 0.1–1.0)
Monocytes Relative: 13 %
Neutro Abs: 0.2 10*3/uL — CL (ref 1.7–7.7)
Neutrophils Relative %: 31 %
Platelets: 47 10*3/uL — ABNORMAL LOW (ref 150–400)
RBC: 2.62 MIL/uL — ABNORMAL LOW (ref 3.87–5.11)
RDW: 17.2 % — ABNORMAL HIGH (ref 11.5–15.5)
WBC: 0.7 10*3/uL — CL (ref 4.0–10.5)
nRBC: 0 % (ref 0.0–0.2)

## 2022-11-07 LAB — COMPREHENSIVE METABOLIC PANEL
ALT: 13 U/L (ref 0–44)
AST: 15 U/L (ref 15–41)
Albumin: 2.2 g/dL — ABNORMAL LOW (ref 3.5–5.0)
Alkaline Phosphatase: 152 U/L — ABNORMAL HIGH (ref 38–126)
Anion gap: 7 (ref 5–15)
BUN: <5 mg/dL — ABNORMAL LOW (ref 6–20)
CO2: 23 mmol/L (ref 22–32)
Calcium: 7.5 mg/dL — ABNORMAL LOW (ref 8.9–10.3)
Chloride: 104 mmol/L (ref 98–111)
Creatinine, Ser: 0.38 mg/dL — ABNORMAL LOW (ref 0.44–1.00)
GFR, Estimated: >60 mL/min (ref >60)
Glucose, Bld: 102 mg/dL — ABNORMAL HIGH (ref 70–99)
Potassium: 2.6 mmol/L — CL (ref 3.5–5.1)
Sodium: 134 mmol/L — ABNORMAL LOW (ref 135–145)
Total Bilirubin: 1.2 mg/dL (ref 0.3–1.2)
Total Protein: 5.5 g/dL — ABNORMAL LOW (ref 6.5–8.1)

## 2022-11-07 LAB — MAGNESIUM: Magnesium: 1.7 mg/dL (ref 1.7–2.4)

## 2022-11-07 MED ORDER — MEGESTROL ACETATE 400 MG/10ML PO SUSP
200.0000 mg | Freq: Every day | ORAL | Status: DC
  Administered 2022-11-07 – 2022-11-08 (×2): 200 mg via ORAL
  Filled 2022-11-07 (×2): qty 10

## 2022-11-07 MED ORDER — MAGNESIUM OXIDE -MG SUPPLEMENT 400 (240 MG) MG PO TABS
400.0000 mg | ORAL_TABLET | Freq: Two times a day (BID) | ORAL | Status: AC
  Administered 2022-11-07 (×2): 400 mg via ORAL
  Filled 2022-11-07 (×2): qty 1

## 2022-11-07 MED ORDER — POTASSIUM CHLORIDE CRYS ER 20 MEQ PO TBCR
40.0000 meq | EXTENDED_RELEASE_TABLET | Freq: Three times a day (TID) | ORAL | Status: AC
  Administered 2022-11-07 – 2022-11-08 (×3): 40 meq via ORAL
  Filled 2022-11-07 (×3): qty 2

## 2022-11-07 MED ORDER — POTASSIUM CHLORIDE 10 MEQ/50ML IV SOLN
10.0000 meq | INTRAVENOUS | Status: AC
  Administered 2022-11-07 (×4): 10 meq via INTRAVENOUS
  Filled 2022-11-07 (×4): qty 50

## 2022-11-07 MED ORDER — HYDROMORPHONE HCL 2 MG PO TABS
6.0000 mg | ORAL_TABLET | ORAL | Status: DC | PRN
  Administered 2022-11-07 – 2022-11-08 (×6): 6 mg via ORAL
  Filled 2022-11-07 (×6): qty 3

## 2022-11-07 MED ORDER — MORPHINE SULFATE ER 30 MG PO TBCR: 45.0000 mg | EXTENDED_RELEASE_TABLET | Freq: Three times a day (TID) | ORAL | Status: DC

## 2022-11-07 MED ORDER — HYDROMORPHONE HCL 2 MG PO TABS
6.0000 mg | ORAL_TABLET | ORAL | Status: DC | PRN
  Administered 2022-11-07 (×3): 6 mg via ORAL
  Filled 2022-11-07 (×3): qty 3

## 2022-11-07 MED ORDER — CIPROFLOXACIN HCL 500 MG PO TABS
500.0000 mg | ORAL_TABLET | Freq: Two times a day (BID) | ORAL | Status: DC
  Administered 2022-11-07: 500 mg via ORAL
  Filled 2022-11-07: qty 1

## 2022-11-07 MED ORDER — HYDROMORPHONE HCL 2 MG PO TABS
8.0000 mg | ORAL_TABLET | ORAL | Status: DC | PRN
Start: 2022-11-07 — End: 2022-11-07

## 2022-11-07 MED ORDER — POTASSIUM CHLORIDE 20 MEQ PO PACK
60.0000 meq | PACK | Freq: Once | ORAL | Status: AC
  Administered 2022-11-07: 60 meq via ORAL
  Filled 2022-11-07: qty 3

## 2022-11-07 MED ORDER — MORPHINE SULFATE ER 30 MG PO TBCR
60.0000 mg | EXTENDED_RELEASE_TABLET | Freq: Three times a day (TID) | ORAL | Status: DC
Start: 2022-11-07 — End: 2022-11-07

## 2022-11-07 MED ORDER — MORPHINE SULFATE ER 30 MG PO TBCR
45.0000 mg | EXTENDED_RELEASE_TABLET | Freq: Three times a day (TID) | ORAL | Status: DC
  Administered 2022-11-07 – 2022-11-08 (×3): 45 mg via ORAL
  Filled 2022-11-07 (×3): qty 1

## 2022-11-07 MED ORDER — CIPROFLOXACIN HCL 500 MG PO TABS: 750.0000 mg | ORAL_TABLET | Freq: Two times a day (BID) | ORAL | Status: DC

## 2022-11-07 MED ORDER — CEFADROXIL 500 MG PO CAPS
1000.0000 mg | ORAL_CAPSULE | Freq: Two times a day (BID) | ORAL | Status: DC
  Administered 2022-11-07 – 2022-11-08 (×2): 1000 mg via ORAL
  Filled 2022-11-07 (×2): qty 2

## 2022-11-07 NOTE — Progress Notes (Signed)
Surprisingly, her blood counts continue to drop.  Again this might be from her chemotherapy which she had a week ago.  Her white cell count is 0.7.  Hemoglobin 7.6.  Platelet count 47,000.  For right now, I am just willing to watch this.  I thought that we might need to do a bone marrow biopsy to make sure there is no marrow involvement by malignancy.  She is still really bothered by pain.  We will have to increase the MS Contin to 45 mg 3 times daily.  I will also increase the Dilaudid to 6 mg p.o. every 2 hours as needed.  I thought that maybe we had to give her a transfusion.  I will hold off on that for right now.  Potassium is incredibly low.  I will give her some IV potassium.  Thankfully her bilirubin went down a little bit.  It is now 1.2.  Her calcium is 7.5 with an albumin of 2.2.  She really needs to eat a little bit more.  I know she is not happy about the food.  I told her to have her family bring food in for her.  She can eat anything she would like.  I know that she is wanting to go home.  I just do not think we can get her home right now.  Thankfully, the Klebsiella is sensitive to most antibiotic.  I would go ahead and put her on ciprofloxacin.  Will stop the Rocephin.  She does not have any diarrhea.  There is no obvious bleeding.  Her vital signs show temperature 98.6.  Pulse 90.  Blood pressure 120/66.  Oral exam does not show mucositis.  Neck is supple with no adenopathy.  Lungs are clear.  She has good air movement bilaterally.  Cardiac exam regular rate and rhythm.  Abdomen is soft.  Bowel sounds are present.  She has colostomy intact.  There is no fluid wave.  There is no palpable liver or spleen tip.  Extremity shows no clubbing, cyanosis or edema.  Skin exam shows no rashes.  Neurological exam is nonfocal.   I really would like to get Ms. Crilly home.  However, I just do not think that she is able to go home given her blood counts that have been dropping.  Again, I  really would like to hope this is still from chemotherapy.  Hopefully we get the pain under better control.  If the one for the bacteremia, she could have a kyphoplasty that could really help with the discomfort.  Once I see her blood counts improving, then we can see back in her home.  Given the low platelets, I will stop the heparin for right now.  I will have her on SCD's.   I do appreciate the incredible care she is gone from everybody up on 5 W.   Christin Bach, MD  Proverbs 17:17

## 2022-11-07 NOTE — Progress Notes (Signed)
PHARMACY NOTE:  ANTIMICROBIAL RENAL DOSAGE ADJUSTMENT  Current antimicrobial regimen includes a mismatch between antimicrobial dosage and estimated renal function.  As per policy approved by the Pharmacy & Therapeutics and Medical Executive Committees, the antimicrobial dosage will be adjusted accordingly.  Current antimicrobial dosage:  Cipro 500 mg BID   Indication: Bacteremia  Renal Function:  Estimated Creatinine Clearance: 73.4 mL/min (A) (by C-G formula based on SCr of 0.38 mg/dL (L)). []      On intermittent HD, scheduled: []      On CRRT    Antimicrobial dosage has been changed to:  Cipro 750 mg BID  Additional comments:Increase dose to optimize coverage of bacteremia   Thank you for allowing pharmacy to be a part of this patient's care.  Sharin Mons, PharmD, BCPS, BCIDP Infectious Diseases Clinical Pharmacist Phone: [phone] 11/07/2022 10:45 AM

## 2022-11-07 NOTE — Progress Notes (Signed)
[facility] PROGRESS NOTE    Progress Note  Rebecca Bullock  ZOX:[id number] DOB: 12/21/1965 DOA: 11/02/2022 PCP: Jomarie Longs, PA-C     Brief Narrative:   Rebecca Bullock is an 57 y.o. female past medical history of stage IV colon cancer with metastases, underwent laparoscopic loop ileostomy on 08/21/2022, recently discharged on 10/08/2022 for jaundice and found to have bile duct obstruction underwent left hepatic biliary drain on 10/03/2022 by IR, with last chemo treatment on 10/31/2022,comes in for anemia fever chills shortness of breath  was found to be tachycardic hypotensive and tachypneic as well as neutropenic on admission,    Assessment/Plan:   Severe sepsis/Klebsiella bacteremia:  Now resolved,  blood cultures grew Klebsiella, and the setting of recent biliary drain placement. Transition to oral cefadroxil  Severe neutropenia (HCC): Was placed on Granix goal of ANC greater than 1500 prior to discharge.  Malignant biliary obstruction: Status post hepatic drain placement on 10/03/2022, IR performed cholangiogram 11/05/2022 with catheter exchange with cholangiogram that showed patency.  Thrombocytopenia/neutropenia/anemia/pancytopenia: Likely due to chemotherapy. Continue to monitor. Will need ANC greater than 1.5. Stop heparin now on SCDs.  Metastatic colon cancer: Continue pain control no plan for kyphoplasty.  Hypomagnesemia: Try to keep greater than 2, this morning 1.7 repleted orally.  Hypokalemia: Try to keep greater than 4.   DVT prophylaxis: lovenox Family Communication:none Status is: Inpatient Remains inpatient appropriate because: Severe sepsis now with hypokalemia and hypomagnesemia    Code Status:     Code Status Orders  (From admission, onward)           Start     Ordered   11/03/22 0602  Full code  Continuous       Question:  By:  Answer:  Consent: discussion documented in EHR   11/03/22 0601           Code Status History     Date  Active Date Inactive Code Status Order ID Comments User Context   11/03/2022 0431 11/03/2022 0601 DNR [id number]  Lurline Del, MD Inpatient   10/02/2022 1354 10/08/2022 2130 Full Code [id number]  Bobette Mo, MD ED   08/18/2022 1358 08/23/2022 1726 Full Code [id number]  Bobette Mo, MD ED   03/14/2021 2327 03/15/2021 1659 Full Code [id number]  Eduard Clos, MD Inpatient      Advance Directive Documentation    Flowsheet Row Most Recent Value  Type of Advance Directive Living will, Healthcare Power of Attorney  Pre-existing out of facility DNR order (yellow form or pink MOST form) --  "MOST" Form in Place? --         IV Access:   Peripheral IV   Procedures and diagnostic studies:   IR EXCHANGE BILIARY DRAIN  Result Date: 11/05/2022 INDICATION: History of metastatic colorectal carcinoma to the liver causing biliary obstruction and status post placement of percutaneous internal/external biliary drainage catheter via left lobe approach on 10/03/2022. Patient has been admitted with biliary sepsis and neutropenia and requires exchange of the biliary drain. EXAM: EXCHANGE OF PERCUTANEOUS INTERNAL/EXTERNAL BILIARY DRAINAGE CATHETER UNDER FLUOROSCOPY INCLUDING CHOLANGIOGRAM MEDICATIONS: None ANESTHESIA/SEDATION: None FLUOROSCOPY: Radiation Exposure Index (as provided by the fluoroscopic device): 79 mGy Kerma CONTRAST:  10 mL Omnipaque 300 COMPLICATIONS: None immediate. PROCEDURE: Informed written consent was obtained from the patient after a thorough discussion of the procedural risks, benefits and alternatives. All questions were addressed. Maximal Sterile Barrier Technique was utilized including caps, mask, sterile gowns, sterile gloves, sterile drape, hand hygiene and skin antiseptic.  A timeout was performed prior to the initiation of the procedure. A cholangiogram was performed through the pre-existing biliary drainage catheter and a fluoroscopic spot image obtained. The  catheter was then removed over a guidewire after cutting the retention suture. A new 10 French multi side-hole internal/external biliary drainage catheter was then advanced over the wire. The distal pigtail portion of the catheter was formed in the duodenal lumen. The catheter was injected with contrast material to confirm position, flushed with sterile saline and attached to a new gravity drainage bag. External catheter exit site was secured with a Prolene retention suture and adhesive retention device. FINDINGS: Initial cholangiogram demonstrates a patent biliary drainage catheter and decompressed intrahepatic and extrahepatic bile ducts with no evidence of biliary obstruction or filling defects. A new drainage catheter was placed with the distal portion formed in the duodenum and sideholes extending up into left intrahepatic ducts. IMPRESSION: Successful exchange of 10 French percutaneous internal/external biliary drainage catheter via left lobe approach under fluoroscopy. Electronically Signed   By: Irish Lack M.D.   On: 11/05/2022 15:33     Medical Consultants:   None.   Subjective:    Janelys Boga having regular bowel movements  Objective:    Vitals:   11/06/22 1243 11/06/22 1939 11/07/22 0506 11/07/22 0700  BP: 106/72 101/61 102/66 123/71  Pulse: 82 78 90 75  Resp: 16 18 18 18   Temp: 98.3 F (36.8 C) 98.9 F (37.2 C) 98.6 F (37 C) 98.3 F (36.8 C)  TempSrc: Oral Oral Oral Oral  SpO2: 97% 99% 99% 100%  Weight:      Height:       SpO2: 100 %   Intake/Output Summary (Last 24 hours) at 11/07/2022 1146 Last data filed at 11/07/2022 0949 Gross per 24 hour  Intake 485 ml  Output 200 ml  Net 285 ml   Filed Weights   11/03/22 0500 11/05/22 0500 11/05/22 1803  Weight: 75.8 kg 76.6 kg 74.6 kg    Exam: General exam: In no acute distress. Respiratory system: Good air movement and clear to auscultation. Cardiovascular system: S1 & S2 heard, RRR. No JVD. Gastrointestinal  system: Abdomen is nondistended, soft and nontender.  Extremities: No pedal edema. Skin: No rashes, lesions or ulcers Psychiatry: Judgement and insight appear normal. Mood & affect appropriate.    Data Reviewed:    Labs: Basic Metabolic Panel: Recent Labs  Lab 11/02/22 1438 11/02/22 1449 11/03/22 0728 11/04/22 0630 11/05/22 0556 11/06/22 0942 11/07/22 0320  NA 132* 136  --  134* 136 134* 134*  K 3.1* 3.3*  --  2.8* 2.8* 2.9* 2.6*  CL 102  --   --  106 108 103 104  CO2 20*  --   --  21* 20* 21* 23  GLUCOSE 120*  --   --  87 87 102* 102*  BUN 10  --   --  8 <5* <5* <5*  CREATININE 0.80  --   --  0.49 0.47 0.51 0.38*  CALCIUM 8.0*  --   --  7.0* 7.2* 7.8* 7.5*  MG  --   --  1.4* 2.1 2.0 1.7 1.7  PHOS  --   --  2.5  --   --   --   --    GFR Estimated Creatinine Clearance: 73.4 mL/min (A) (by C-G formula based on SCr of 0.38 mg/dL (L)). Liver Function Tests: Recent Labs  Lab 11/02/22 1438 11/04/22 0630 11/05/22 0556 11/06/22 0942 11/07/22 0320  AST 47*  25 23 18 15   ALT 29 21 18 16 13   ALKPHOS 211* 133* 179* 195* 152*  BILITOT 1.7* 1.4* 1.1 1.6* 1.2  PROT 7.2 5.0* 5.7* 6.1* 5.5*  ALBUMIN 2.8* 2.1* 2.2* 2.4* 2.2*   No results for input(s): "LIPASE", "AMYLASE" in the last 168 hours. No results for input(s): "AMMONIA" in the last 168 hours. Coagulation profile Recent Labs  Lab 11/02/22 1438  INR 1.5*   COVID-19 Labs  No results for input(s): "DDIMER", "FERRITIN", "LDH", "CRP" in the last 72 hours.  Lab Results  Component Value Date   SARSCOV2NAA NEGATIVE 06/20/2021   SARSCOV2NAA NEGATIVE 03/14/2021    CBC: Recent Labs  Lab 11/02/22 1438 11/02/22 1449 11/04/22 0630 11/05/22 0556 11/06/22 0942 11/07/22 0320  WBC 0.8*  --  1.3* 1.0* 0.8* 0.7*  NEUTROABS 0.4*  --  0.8* 0.5* 0.4* 0.2*  HGB 10.0* 9.2* 7.5* 7.9* 8.3* 7.6*  HCT 31.9* 27.0* 24.5* 24.9* 26.3* 23.5*  MCV 90.1  --  92.5 92.6 90.1 89.7  PLT 158  --  91* 71* 61* 47*   Cardiac Enzymes: No  results for input(s): "CKTOTAL", "CKMB", "CKMBINDEX", "TROPONINI" in the last 168 hours. BNP (last 3 results) No results for input(s): "PROBNP" in the last 8760 hours. CBG: No results for input(s): "GLUCAP" in the last 168 hours. D-Dimer: No results for input(s): "DDIMER" in the last 72 hours. Hgb A1c: No results for input(s): "HGBA1C" in the last 72 hours. Lipid Profile: No results for input(s): "CHOL", "HDL", "LDLCALC", "TRIG", "CHOLHDL", "LDLDIRECT" in the last 72 hours. Thyroid function studies: No results for input(s): "TSH", "T4TOTAL", "T3FREE", "THYROIDAB" in the last 72 hours.  Invalid input(s): "FREET3" Anemia work up: No results for input(s): "VITAMINB12", "FOLATE", "FERRITIN", "TIBC", "IRON", "RETICCTPCT" in the last 72 hours. Sepsis Labs: Recent Labs  Lab 11/02/22 1438 11/02/22 1658 11/02/22 1842 11/02/22 2153 11/04/22 0630 11/05/22 0556 11/06/22 0942 11/07/22 0320  WBC 0.8*  --   --   --  1.3* 1.0* 0.8* 0.7*  LATICACIDVEN 2.9* 3.3* 2.4* 1.7  --   --   --   --    Microbiology Recent Results (from the past 240 hour(s))  Blood Culture (routine x 2)     Status: Abnormal   Collection Time: 11/02/22  2:37 PM   Specimen: BLOOD  Result Value Ref Range Status   Specimen Description   Final    BLOOD RIGHT ANTECUBITAL Performed at [facility], [address] [place], [place] [address]    Special Requests   Final    BOTTLES DRAWN AEROBIC AND ANAEROBIC Blood Culture results may not be optimal due to an inadequate volume of blood received in culture bottles Performed at [facility], [address] [place], [place] [address]    Culture  Setup Time   Final    GRAM NEGATIVE RODS IN BOTH AEROBIC AND ANAEROBIC BOTTLES CRITICAL RESULT CALLED TO, READ BACK BY AND VERIFIED WITH: Malcolm Metro [phone] FCP Performed at [facility], [address], [place], [place] [address]    Culture KLEBSIELLA PNEUMONIAE (A)  Final   Report Status  11/05/2022 FINAL  Final   Organism ID, Bacteria KLEBSIELLA PNEUMONIAE  Final      Susceptibility   Klebsiella pneumoniae - MIC*    AMPICILLIN >=32 RESISTANT Resistant     CEFEPIME <=0.12 SENSITIVE Sensitive     CEFTAZIDIME <=1 SENSITIVE Sensitive     CEFTRIAXONE <=0.25 SENSITIVE Sensitive  CIPROFLOXACIN <=0.25 SENSITIVE Sensitive     GENTAMICIN <=1 SENSITIVE Sensitive     IMIPENEM <=0.25 SENSITIVE Sensitive     TRIMETH/SULFA <=20 SENSITIVE Sensitive     AMPICILLIN/SULBACTAM 4 SENSITIVE Sensitive     PIP/TAZO <=4 SENSITIVE Sensitive     * KLEBSIELLA PNEUMONIAE  Blood Culture ID Panel (Reflexed)     Status: Abnormal   Collection Time: 11/02/22  2:37 PM  Result Value Ref Range Status   Enterococcus faecalis NOT DETECTED NOT DETECTED Final   Enterococcus Faecium NOT DETECTED NOT DETECTED Final   Listeria monocytogenes NOT DETECTED NOT DETECTED Final   Staphylococcus species NOT DETECTED NOT DETECTED Final   Staphylococcus aureus (BCID) NOT DETECTED NOT DETECTED Final   Staphylococcus epidermidis NOT DETECTED NOT DETECTED Final   Staphylococcus lugdunensis NOT DETECTED NOT DETECTED Final   Streptococcus species NOT DETECTED NOT DETECTED Final   Streptococcus agalactiae NOT DETECTED NOT DETECTED Final   Streptococcus pneumoniae NOT DETECTED NOT DETECTED Final   Streptococcus pyogenes NOT DETECTED NOT DETECTED Final   A.calcoaceticus-baumannii NOT DETECTED NOT DETECTED Final   Bacteroides fragilis NOT DETECTED NOT DETECTED Final   Enterobacterales DETECTED (A) NOT DETECTED Final    Comment: Enterobacterales represent a large order of gram negative bacteria, not a single organism. CRITICAL RESULT CALLED TO, READ BACK BY AND VERIFIED WITH: PHARMD A. P [phone] FCP    Enterobacter cloacae complex NOT DETECTED NOT DETECTED Final   Escherichia coli NOT DETECTED NOT DETECTED Final   Klebsiella aerogenes NOT DETECTED NOT DETECTED Final   Klebsiella oxytoca NOT DETECTED NOT DETECTED  Final   Klebsiella pneumoniae DETECTED (A) NOT DETECTED Final    Comment: CRITICAL RESULT CALLED TO, READ BACK BY AND VERIFIED WITH: PHARMD A. P [phone] FCP    Proteus species NOT DETECTED NOT DETECTED Final   Salmonella species NOT DETECTED NOT DETECTED Final   Serratia marcescens NOT DETECTED NOT DETECTED Final   Haemophilus influenzae NOT DETECTED NOT DETECTED Final   Neisseria meningitidis NOT DETECTED NOT DETECTED Final   Pseudomonas aeruginosa NOT DETECTED NOT DETECTED Final   Stenotrophomonas maltophilia NOT DETECTED NOT DETECTED Final   Candida albicans NOT DETECTED NOT DETECTED Final   Candida auris NOT DETECTED NOT DETECTED Final   Candida glabrata NOT DETECTED NOT DETECTED Final   Candida krusei NOT DETECTED NOT DETECTED Final   Candida parapsilosis NOT DETECTED NOT DETECTED Final   Candida tropicalis NOT DETECTED NOT DETECTED Final   Cryptococcus neoformans/gattii NOT DETECTED NOT DETECTED Final   CTX-M ESBL NOT DETECTED NOT DETECTED Final   Carbapenem resistance IMP NOT DETECTED NOT DETECTED Final   Carbapenem resistance KPC NOT DETECTED NOT DETECTED Final   Carbapenem resistance NDM NOT DETECTED NOT DETECTED Final   Carbapenem resist OXA 48 LIKE NOT DETECTED NOT DETECTED Final   Carbapenem resistance VIM NOT DETECTED NOT DETECTED Final    Comment: Performed at [facility], [address], [place], [place] [address]  MRSA Next Gen by PCR, Nasal     Status: None   Collection Time: 11/03/22  2:14 AM   Specimen: Nasal Mucosa; Nasal Swab  Result Value Ref Range Status   MRSA by PCR Next Gen NOT DETECTED NOT DETECTED Final    Comment: (NOTE) The GeneXpert MRSA Assay (FDA approved for NASAL specimens only), is one component of a comprehensive MRSA colonization surveillance program. It is not intended to diagnose MRSA infection nor to guide or monitor treatment for MRSA infections. Test performance is not  FDA approved in patients less than 16 years old. Performed  at [facility], [address], [place], [place] [address]   Blood Culture (routine x 2)     Status: None (Preliminary result)   Collection Time: 11/03/22  3:10 AM   Specimen: BLOOD  Result Value Ref Range Status   Specimen Description   Final    BLOOD BLOOD RIGHT HAND Performed at [facility], [address], [place], [place] [address]    Special Requests   Final    BOTTLES DRAWN AEROBIC AND ANAEROBIC Blood Culture adequate volume Performed at [facility], [address] [place], [place] [address]    Culture   Final    NO GROWTH 4 DAYS Performed at [facility], [address], [place], [place] [address]    Report Status PENDING  Incomplete  Culture, blood (x 2)     Status: None (Preliminary result)   Collection Time: 11/03/22  7:28 AM   Specimen: BLOOD LEFT HAND  Result Value Ref Range Status   Specimen Description   Final    BLOOD LEFT HAND Performed at [facility], [address] [place], [place] [address]    Special Requests   Final    AEROBIC BOTTLE ONLY Blood Culture adequate volume Performed at [facility], [address], [place], [place] [address]    Culture   Final    NO GROWTH 4 DAYS Performed at [facility], [address], [place], [place] [address]    Report Status PENDING  Incomplete  Culture, blood (x 2)     Status: None (Preliminary result)   Collection Time: 11/03/22  7:39 AM   Specimen: BLOOD RIGHT HAND  Result Value Ref Range Status   Specimen Description   Final    BLOOD RIGHT HAND Performed at [facility], [address], [place], [place] [address]    Special Requests   Final    AEROBIC BOTTLE ONLY Blood Culture adequate volume Performed at [facility], [address] [place], [place] [address]    Culture   Final    NO GROWTH 4 DAYS Performed at [facility], [address], [place], [place] [address]     Report Status PENDING  Incomplete     Medications:    Chlorhexidine Gluconate Cloth  6 each Topical Daily   ciprofloxacin  750 mg Oral BID   midodrine  2.5 mg Oral TID WC   morphine  45 mg Oral Q8H   sodium chloride flush  10-40 mL Intracatheter Q12H   sodium chloride flush  5 mL Intracatheter Q12H   Tbo-Filgrastim  480 mcg Subcutaneous q1800   Continuous Infusions:  potassium chloride 10 mEq (11/07/22 1051)      LOS: 4 days   Marinda Elk  [facility]  11/07/2022, 11:46 AM

## 2022-11-08 ENCOUNTER — Encounter: Payer: Self-pay | Admitting: *Deleted

## 2022-11-08 ENCOUNTER — Other Ambulatory Visit: Payer: Self-pay

## 2022-11-08 ENCOUNTER — Other Ambulatory Visit (HOSPITAL_BASED_OUTPATIENT_CLINIC_OR_DEPARTMENT_OTHER): Payer: Self-pay

## 2022-11-08 ENCOUNTER — Other Ambulatory Visit: Payer: Self-pay | Admitting: *Deleted

## 2022-11-08 ENCOUNTER — Other Ambulatory Visit (HOSPITAL_COMMUNITY): Payer: Self-pay

## 2022-11-08 DIAGNOSIS — C189 Malignant neoplasm of colon, unspecified: Secondary | ICD-10-CM

## 2022-11-08 DIAGNOSIS — Z7189 Other specified counseling: Secondary | ICD-10-CM

## 2022-11-08 DIAGNOSIS — G893 Neoplasm related pain (acute) (chronic): Secondary | ICD-10-CM

## 2022-11-08 DIAGNOSIS — D61818 Other pancytopenia: Secondary | ICD-10-CM

## 2022-11-08 DIAGNOSIS — A419 Sepsis, unspecified organism: Secondary | ICD-10-CM | POA: Diagnosis not present

## 2022-11-08 DIAGNOSIS — E876 Hypokalemia: Secondary | ICD-10-CM

## 2022-11-08 DIAGNOSIS — Z515 Encounter for palliative care: Secondary | ICD-10-CM

## 2022-11-08 DIAGNOSIS — R530 Neoplastic (malignant) related fatigue: Secondary | ICD-10-CM

## 2022-11-08 DIAGNOSIS — R6520 Severe sepsis without septic shock: Secondary | ICD-10-CM | POA: Diagnosis not present

## 2022-11-08 DIAGNOSIS — R7881 Bacteremia: Secondary | ICD-10-CM | POA: Diagnosis not present

## 2022-11-08 LAB — COMPREHENSIVE METABOLIC PANEL
ALT: 13 U/L (ref 0–44)
AST: 15 U/L (ref 15–41)
Albumin: 2.2 g/dL — ABNORMAL LOW (ref 3.5–5.0)
Alkaline Phosphatase: 143 U/L — ABNORMAL HIGH (ref 38–126)
Anion gap: 6 (ref 5–15)
BUN: <5 mg/dL — ABNORMAL LOW (ref 6–20)
CO2: 23 mmol/L (ref 22–32)
Calcium: 7.8 mg/dL — ABNORMAL LOW (ref 8.9–10.3)
Chloride: 105 mmol/L (ref 98–111)
Creatinine, Ser: 0.42 mg/dL — ABNORMAL LOW (ref 0.44–1.00)
GFR, Estimated: >60 mL/min (ref >60)
Glucose, Bld: 105 mg/dL — ABNORMAL HIGH (ref 70–99)
Potassium: 4.1 mmol/L (ref 3.5–5.1)
Sodium: 134 mmol/L — ABNORMAL LOW (ref 135–145)
Total Bilirubin: 1.3 mg/dL — ABNORMAL HIGH (ref 0.3–1.2)
Total Protein: 6.1 g/dL — ABNORMAL LOW (ref 6.5–8.1)

## 2022-11-08 LAB — CBC WITH DIFFERENTIAL/PLATELET
Abs Immature Granulocytes: 0 10*3/uL (ref 0.00–0.07)
Basophils Absolute: 0 10*3/uL (ref 0.0–0.1)
Basophils Relative: 1 %
Eosinophils Absolute: 0 10*3/uL (ref 0.0–0.5)
Eosinophils Relative: 3 %
HCT: 25.1 % — ABNORMAL LOW (ref 36.0–46.0)
Hemoglobin: 8 g/dL — ABNORMAL LOW (ref 12.0–15.0)
Immature Granulocytes: 0 %
Lymphocytes Relative: 60 %
Lymphs Abs: 0.5 10*3/uL — ABNORMAL LOW (ref 0.7–4.0)
MCH: 29 pg (ref 26.0–34.0)
MCHC: 31.9 g/dL (ref 30.0–36.0)
MCV: 90.9 fL (ref 80.0–100.0)
Monocytes Absolute: 0.1 10*3/uL (ref 0.1–1.0)
Monocytes Relative: 11 %
Neutro Abs: 0.2 10*3/uL — CL (ref 1.7–7.7)
Neutrophils Relative %: 25 %
Platelets: 51 10*3/uL — ABNORMAL LOW (ref 150–400)
RBC: 2.76 MIL/uL — ABNORMAL LOW (ref 3.87–5.11)
RDW: 17.2 % — ABNORMAL HIGH (ref 11.5–15.5)
WBC: 0.8 10*3/uL — CL (ref 4.0–10.5)
nRBC: 0 % (ref 0.0–0.2)

## 2022-11-08 LAB — CULTURE, BLOOD (ROUTINE X 2)
Special Requests: ADEQUATE
Special Requests: ADEQUATE

## 2022-11-08 LAB — MAGNESIUM: Magnesium: 1.8 mg/dL (ref 1.7–2.4)

## 2022-11-08 MED ORDER — HYDROMORPHONE HCL 2 MG PO TABS
6.0000 mg | ORAL_TABLET | ORAL | 0 refills | Status: DC | PRN
  Filled 2022-11-08: qty 120, 4d supply, fill #0

## 2022-11-08 MED ORDER — MORPHINE SULFATE ER 30 MG PO TBCR
30.0000 mg | EXTENDED_RELEASE_TABLET | Freq: Three times a day (TID) | ORAL | 0 refills | Status: DC
  Filled 2022-11-08 – 2022-11-24 (×3): qty 90, 30d supply, fill #0

## 2022-11-08 MED ORDER — CEFADROXIL 500 MG PO CAPS
1000.0000 mg | ORAL_CAPSULE | Freq: Two times a day (BID) | ORAL | 0 refills | Status: AC
  Filled 2022-11-08: qty 40, 10d supply, fill #0

## 2022-11-08 MED ORDER — MIDODRINE HCL 2.5 MG PO TABS
2.5000 mg | ORAL_TABLET | Freq: Three times a day (TID) | ORAL | 1 refills | Status: DC
  Filled 2022-11-08: qty 60, 20d supply, fill #0
  Filled 2022-11-08: qty 30, 10d supply, fill #0
  Filled 2022-12-01: qty 90, 30d supply, fill #1

## 2022-11-08 MED ORDER — MORPHINE SULFATE ER 15 MG PO TBCR
15.0000 mg | EXTENDED_RELEASE_TABLET | Freq: Three times a day (TID) | ORAL | 0 refills | Status: DC
  Filled 2022-11-08: qty 90, 30d supply, fill #0

## 2022-11-08 MED ORDER — MAGNESIUM OXIDE -MG SUPPLEMENT 400 (240 MG) MG PO TABS
400.0000 mg | ORAL_TABLET | Freq: Two times a day (BID) | ORAL | Status: DC
  Administered 2022-11-08: 400 mg via ORAL
  Filled 2022-11-08: qty 1

## 2022-11-08 MED ORDER — TBO-FILGRASTIM 480 MCG/0.8ML ~~LOC~~ SOSY: 480.0000 ug | PREFILLED_SYRINGE | Freq: Every day | SUBCUTANEOUS | 0 refills | Status: DC

## 2022-11-08 MED ORDER — HEPARIN SOD (PORK) LOCK FLUSH 100 UNIT/ML IV SOLN
500.0000 [IU] | INTRAVENOUS | Status: AC | PRN
  Administered 2022-11-08: 500 [IU]

## 2022-11-08 NOTE — Progress Notes (Signed)
Received approval from plan for MS Contin 15 mg effective through 02/06/23 and Dilaudid 2 mg (3 tabs) effective through 05/07/23.

## 2022-11-08 NOTE — Discharge Summary (Signed)
Physician Discharge Summary  Rebecca Bullock UJW:[id number] DOB: 1965/12/25 DOA: 11/02/2022  PCP: Jomarie Longs, PA-C  Admit date: 11/02/2022 Discharge date: 11/08/2022  Admitted From: Home Disposition:  Home  Recommendations for Outpatient Follow-up:  Follow up with Oncology in 1-2 weeks Please obtain BMP/CBC in one week   Home Health:No Equipment/Devices:None  Discharge Condition:Stable CODE STATUS:Full Diet recommendation: Heart Healthy   Brief/Interim Summary:  57 y.o. female past medical history of stage IV colon cancer with metastases, underwent laparoscopic loop ileostomy on 08/21/2022, recently discharged on 10/08/2022 for jaundice and found to have bile duct obstruction underwent left hepatic biliary drain on 10/03/2022 by IR, with last chemo treatment on 10/31/2022,comes in for anemia fever chills shortness of breath  was found to be tachycardic hypotensive and tachypneic as well as neutropenic on admission,   Discharge Diagnoses:  Active Problems:   Severe neutropenia (HCC)   Bile duct obstruction, intrahepatic   Bacteremia due to Klebsiella pneumoniae   Colon cancer metastasized to liver [facility])   Lumbar compression fracture (HCC)   Hypokalemia   Hypomagnesemia  Severe sepsis due to Klebsiella bacteremia: Start empirically on antibiotic blood culture grew Klebsiella sensitive to cephalosporins she was transition to oral cephalosporin she will continue as an outpatient for 10 additional days.  Severe neutropenia: She was placed on Granix hematology was consulted we agree with treatment. Her count was slow to increase. The patient wanted to go home. The oncology and I had a long conversation with the patient and she is aware of the risk of leaving early. But she relates that she does not have enough time and she would like to go home. Oncologist has arranged for the patient to get stimulating granulocytes factors as an outpatient.  Malignant biliary obstruction: Status post  hepatic drain placement on 10/03/2022. IR performed angiogram on 11/05/2022 with catheter exchange and cholangiogram showing patency.  Thrombocytopenia/neutropenia/anemia/pancytopenia: Likely due to chemotherapy t her counts are stable. Will follow-up with hematology as an outpatient.  Metastatic colon cancer: No plan for kyphoplasty continue current narcotic regimen.  Hypomagnesemia/hypokalemia: They were repleted orally now resolved.    Discharge Instructions  Discharge Instructions     Diet - low sodium heart healthy   Complete by: As directed    Increase activity slowly   Complete by: As directed    No wound care   Complete by: As directed       Allergies as of 11/08/2022       Reactions   Augmentin [amoxicillin-pot Clavulanate] Diarrhea, Other (See Comments)   Extreme diarrhea, abdominal pain.   Irinotecan Other (See Comments)   Flushing, tingling, visual disturbance   Metronidazole Diarrhea, Other (See Comments)   Vancomycin Rash, Other (See Comments)   "Red Man Syndrome"   Latex Rash   Tape Rash   Wound Dressing Adhesive Rash        Medication List     TAKE these medications    alum & mag hydroxide-simeth suspension-nystatin suspension-diphenhydrAMINE liquid Take 5 mLs by mouth 4 (four) times daily as needed for mouth pain.   baclofen 10 MG tablet Commonly known as: LIORESAL Take 1 tablet (10 mg total) by mouth 3 (three) times daily. What changed:  when to take this reasons to take this   cefadroxil 500 MG capsule Commonly known as: DURICEF Take 2 capsules (1,000 mg total) by mouth 2 (two) times daily for 10 days.   dexamethasone 4 MG tablet Commonly known as: DECADRON Take 2 tablets (8 mg total) by mouth  daily. Start the day after chemotherapy for 2 days. Take with food.   famotidine 40 MG tablet Commonly known as: PEPCID Take 1 tablet (40 mg total) by mouth 2 (two) times daily.   HYDROmorphone 4 MG tablet Commonly known as: Dilaudid Take  1 tablet (4 mg total) by mouth every 2 (two) hours as needed for severe pain.   LORazepam 0.5 MG tablet Commonly known as: ATIVAN Place 1 tablet (0.5 mg total) under the tongue every 4 (four) hours as needed for anxiety.   megestrol 40 MG/ML suspension Commonly known as: MEGACE Take 10 mLs (400 mg total) by mouth 2 (two) times daily.   meloxicam 7.5 MG tablet Commonly known as: Mobic Take 1 tablet (7.5 mg total) by mouth 2 (two) times daily.   midodrine 2.5 MG tablet Commonly known as: PROAMATINE Take 1 tablet (2.5 mg total) by mouth 3 (three) times daily with meals.   morphine 30 MG 12 hr tablet Commonly known as: MS CONTIN Take 1 tablet (30 mg total) by mouth every 8 (eight) hours.   Normal Saline Flush 0.9 % Soln Flush abdominal drain with 5 cc sterile saline once daily   OLANZapine 10 MG tablet Commonly known as: ZYPREXA Take 1 tablet (10 mg total) by mouth at bedtime.   ondansetron 4 MG tablet Commonly known as: ZOFRAN Take 1 tablet (4 mg total) by mouth every 6 (six) hours.   Tbo-Filgrastim 480 MCG/0.8ML Sosy injection Commonly known as: GRANIX Inject 0.8 mLs (480 mcg total) into the skin daily at 6 PM.   Transderm-Scop 1 MG/3DAYS Generic drug: scopolamine Place one patch behind the ear every three days.        Allergies  Allergen Reactions   Augmentin [Amoxicillin-Pot Clavulanate] Diarrhea and Other (See Comments)    Extreme diarrhea, abdominal pain.   Irinotecan Other (See Comments)    Flushing, tingling, visual disturbance   Metronidazole Diarrhea and Other (See Comments)   Vancomycin Rash and Other (See Comments)    "Red Man Syndrome"   Latex Rash   Tape Rash   Wound Dressing Adhesive Rash    Consultations: Hematology   Procedures/Studies: IR EXCHANGE BILIARY DRAIN  Result Date: 11/05/2022 INDICATION: History of metastatic colorectal carcinoma to the liver causing biliary obstruction and status post placement of percutaneous  internal/external biliary drainage catheter via left lobe approach on 10/03/2022. Patient has been admitted with biliary sepsis and neutropenia and requires exchange of the biliary drain. EXAM: EXCHANGE OF PERCUTANEOUS INTERNAL/EXTERNAL BILIARY DRAINAGE CATHETER UNDER FLUOROSCOPY INCLUDING CHOLANGIOGRAM MEDICATIONS: None ANESTHESIA/SEDATION: None FLUOROSCOPY: Radiation Exposure Index (as provided by the fluoroscopic device): 79 mGy Kerma CONTRAST:  10 mL Omnipaque 300 COMPLICATIONS: None immediate. PROCEDURE: Informed written consent was obtained from the patient after a thorough discussion of the procedural risks, benefits and alternatives. All questions were addressed. Maximal Sterile Barrier Technique was utilized including caps, mask, sterile gowns, sterile gloves, sterile drape, hand hygiene and skin antiseptic. A timeout was performed prior to the initiation of the procedure. A cholangiogram was performed through the pre-existing biliary drainage catheter and a fluoroscopic spot image obtained. The catheter was then removed over a guidewire after cutting the retention suture. A new 10 French multi side-hole internal/external biliary drainage catheter was then advanced over the wire. The distal pigtail portion of the catheter was formed in the duodenal lumen. The catheter was injected with contrast material to confirm position, flushed with sterile saline and attached to a new gravity drainage bag. External catheter exit site was secured  with a Prolene retention suture and adhesive retention device. FINDINGS: Initial cholangiogram demonstrates a patent biliary drainage catheter and decompressed intrahepatic and extrahepatic bile ducts with no evidence of biliary obstruction or filling defects. A new drainage catheter was placed with the distal portion formed in the duodenum and sideholes extending up into left intrahepatic ducts. IMPRESSION: Successful exchange of 10 French percutaneous internal/external  biliary drainage catheter via left lobe approach under fluoroscopy. Electronically Signed   By: Irish Lack M.D.   On: 11/05/2022 15:33   CT ABDOMEN PELVIS WO CONTRAST  Result Date: 11/02/2022 CLINICAL DATA:  Sepsis EXAM: CT ABDOMEN AND PELVIS WITHOUT CONTRAST TECHNIQUE: Multidetector CT imaging of the abdomen and pelvis was performed following the standard protocol without IV contrast. RADIATION DOSE REDUCTION: This exam was performed according to the departmental dose-optimization program which includes automated exposure control, adjustment of the mA and/or kV according to patient size and/or use of iterative reconstruction technique. COMPARISON:  CT abdomen and pelvis 10/03/2022 FINDINGS: Lower chest: No acute abnormality. Indeterminate nodules measuring up to 3 mm are similar to prior. Hepatobiliary: Large lobulated heterogenously hypoattenuating mass in the right hepatic dome is unchanged in size and measures approximately 11.4 cm. Unchanged hepatic cyst in the inferior right hepatic lobe. New percutaneous biliary drainage catheter. Decreased dilation of the intrahepatic bile ducts. Cholelithiasis with similar gallbladder wall thickening. Similar soft tissue in the porta hepatis which could represent lymphadenopathy or tumor. Pancreas: Unremarkable. Spleen: The spleen is mildly enlarged measuring 13.6 cm in craniocaudal dimension. Adrenals/Urinary Tract: Stable adrenal glands. No urinary calculi or hydronephrosis. Contrast within the bladder. Stomach/Bowel: Normal caliber large and small bowel. Bowel wall thickening of the cecum and right colon compatible with known primary colon cancer. Loop ileostomy in the right anterior abdomen. Vascular/Lymphatic: Unchanged porta hepatis lymphadenopathy measuring up to 19 mm in short axis on series 2/image 24). 9 mm ileocolic node is unchanged. No acute vascular abnormality Reproductive: Uterus and bilateral adnexa are unremarkable. Other: Small volume free fluid  in the pelvis. No free intraperitoneal air. Musculoskeletal: Sclerotic lesion in the L1 vertebral body with superior endplate compression fracture is unchanged. No acute fracture. IMPRESSION: 1. New percutaneous biliary drainage catheter with decreased dilation of the intrahepatic bile ducts. 2. Unchanged size of the large hepatic dome metastasis. Similar porta hepatis lymphadenopathy. 3. Unchanged wall thickening of the cecum and right colon compatible with known primary colon cancer. 4. Cholelithiasis and gallbladder wall thickening similar to prior 5. Unchanged sclerotic lesion in the L1 vertebral body with pathologic superior endplate compression fracture. 6. Small volume free fluid in the pelvis. 7. Mild splenomegaly. 8. Indeterminate pulmonary nodules measuring up to 3 mm are similar to prior. Electronically Signed   By: Minerva Fester M.D.   On: 11/02/2022 20:03   CT Angio Chest PE W/Cm &/Or Wo Cm  Result Date: 11/02/2022 CLINICAL DATA:  History of metastatic colon cancer, presenting with increased drowsiness. EXAM: CT ANGIOGRAPHY CHEST WITH CONTRAST TECHNIQUE: Multidetector CT imaging of the chest was performed using the standard protocol during bolus administration of intravenous contrast. Multiplanar CT image reconstructions and MIPs were obtained to evaluate the vascular anatomy. RADIATION DOSE REDUCTION: This exam was performed according to the departmental dose-optimization program which includes automated exposure control, adjustment of the mA and/or kV according to patient size and/or use of iterative reconstruction technique. CONTRAST:  OMNIPAQUE IOHEXOL 350 MG/ML SOLN COMPARISON:  Chest CT with contrast, dated April 05, 2021 and abdomen and pelvis CT dated Oct 03, 2022 FINDINGS:  Cardiovascular: A right-sided venous Port-A-Cath is in place. The thoracic aorta is normal in appearance. Satisfactory opacification of the pulmonary arteries to the segmental level. No evidence of pulmonary  embolism. Normal heart size. No pericardial effusion. Mediastinum/Nodes: No enlarged mediastinal, hilar, or axillary lymph nodes. A 7 mm thyroid nodule is seen within the lower pole of the left lobe of the thyroid gland. The trachea and esophagus demonstrate no significant findings. Lungs/Pleura: Mild lingular and posterior right basilar atelectasis is seen. There is no evidence of acute infiltrate, pleural effusion or pneumothorax. Upper Abdomen: There is diffuse fatty infiltration of the liver parenchyma. An 11.2 cm x 10.7 cm x 11.2 cm area of heterogeneous low attenuation is seen occupying the majority of the posterior aspect of the right lobe of the liver. A stable 1.6 cm x 1.7 cm x 1.8 cm area of parenchymal low attenuation is seen within the anterolateral aspect of an enlarged spleen. A percutaneous biliary drainage catheter is in place. A 1.6 cm gallstone is seen within a contracted gallbladder. Musculoskeletal: The L1 vertebral body is sclerotic and heterogeneous in appearance. This is seen on the prior abdomen and pelvis CT. Review of the MIP images confirms the above findings. IMPRESSION: 1. No evidence of pulmonary embolism. 2. Mild lingular and posterior right basilar atelectasis. 3. Large hepatic mass, consistent with metastatic disease. 4. Cholelithiasis. 5. Percutaneous biliary drainage catheter in place. 6. Findings consistent with osseous metastatic disease involving the L1 vertebral body. 7. 7 mm left thyroid nodule. No follow-up imaging is recommended. This follows ACR consensus guidelines: Managing Incidental Thyroid Nodules Detected on Imaging: White Paper of the ACR Incidental Thyroid Findings Committee. J Am Coll Radiol 2015; 12:143-150. Electronically Signed   By: Aram Candela M.D.   On: 11/02/2022 18:41   DG Chest 2 View  Result Date: 11/02/2022 CLINICAL DATA:  Shortness of breath. History of metastatic colon cancer. EXAM: CHEST - 2 VIEW COMPARISON:  08/18/2022 FINDINGS: No  consolidation, pneumothorax or effusion. No edema. Normal cardiopericardial silhouette. Degenerative changes of the spine on lateral view. Stable right upper chest port with tip along the central SVC above the right atrium. Separate catheter in the upper abdomen. IMPRESSION: No acute cardiopulmonary disease.  Chest port Electronically Signed   By: Karen Kays M.D.   On: 11/02/2022 14:24   (Echo, Carotid, EGD, Colonoscopy, ERCP)    Subjective: No complaints  Discharge Exam: Vitals:   11/08/22 0011 11/08/22 0454  BP: 103/68 111/74  Pulse: 91 92  Resp:  18  Temp:  98.1 F (36.7 C)  SpO2: 98% 98%   Vitals:   11/07/22 1942 11/07/22 1951 11/08/22 0011 11/08/22 0454  BP: (!) 125/56 122/71 103/68 111/74  Pulse: 84 84 91 92  Resp: 17 20  18   Temp: 98.4 F (36.9 C) 98.6 F (37 C)  98.1 F (36.7 C)  TempSrc: Oral Oral  Oral  SpO2: 98% 99% 98% 98%  Weight:      Height:        General: Pt is alert, awake, not in acute distress Cardiovascular: RRR, S1/S2 +, no rubs, no gallops Respiratory: CTA bilaterally, no wheezing, no rhonchi Abdominal: Soft, NT, ND, bowel sounds + Extremities: no edema, no cyanosis    The results of significant diagnostics from this hospitalization (including imaging, microbiology, ancillary and laboratory) are listed below for reference.     Microbiology: Recent Results (from the past 240 hour(s))  Blood Culture (routine x 2)     Status: Abnormal  Collection Time: 11/02/22  2:37 PM   Specimen: BLOOD  Result Value Ref Range Status   Specimen Description   Final    BLOOD RIGHT ANTECUBITAL Performed at [facility], [address] [place], [place] [address]    Special Requests   Final    BOTTLES DRAWN AEROBIC AND ANAEROBIC Blood Culture results may not be optimal due to an inadequate volume of blood received in culture bottles Performed at [facility], [address] [place], [place] [address]    Culture  Setup Time   Final     GRAM NEGATIVE RODS IN BOTH AEROBIC AND ANAEROBIC BOTTLES CRITICAL RESULT CALLED TO, READ BACK BY AND VERIFIED WITH: Malcolm Metro [id number] FCP Performed at [facility], [address] [place], [place] [address]    Culture KLEBSIELLA PNEUMONIAE (A)  Final   Report Status 11/05/2022 FINAL  Final   Organism ID, Bacteria KLEBSIELLA PNEUMONIAE  Final      Susceptibility   Klebsiella pneumoniae - MIC*    AMPICILLIN >=32 RESISTANT Resistant     CEFEPIME <=0.12 SENSITIVE Sensitive     CEFTAZIDIME <=1 SENSITIVE Sensitive     CEFTRIAXONE <=0.25 SENSITIVE Sensitive     CIPROFLOXACIN <=0.25 SENSITIVE Sensitive     GENTAMICIN <=1 SENSITIVE Sensitive     IMIPENEM <=0.25 SENSITIVE Sensitive     TRIMETH/SULFA <=20 SENSITIVE Sensitive     AMPICILLIN/SULBACTAM 4 SENSITIVE Sensitive     PIP/TAZO <=4 SENSITIVE Sensitive     * KLEBSIELLA PNEUMONIAE  Blood Culture ID Panel (Reflexed)     Status: Abnormal   Collection Time: 11/02/22  2:37 PM  Result Value Ref Range Status   Enterococcus faecalis NOT DETECTED NOT DETECTED Final   Enterococcus Faecium NOT DETECTED NOT DETECTED Final   Listeria monocytogenes NOT DETECTED NOT DETECTED Final   Staphylococcus species NOT DETECTED NOT DETECTED Final   Staphylococcus aureus (BCID) NOT DETECTED NOT DETECTED Final   Staphylococcus epidermidis NOT DETECTED NOT DETECTED Final   Staphylococcus lugdunensis NOT DETECTED NOT DETECTED Final   Streptococcus species NOT DETECTED NOT DETECTED Final   Streptococcus agalactiae NOT DETECTED NOT DETECTED Final   Streptococcus pneumoniae NOT DETECTED NOT DETECTED Final   Streptococcus pyogenes NOT DETECTED NOT DETECTED Final   A.calcoaceticus-baumannii NOT DETECTED NOT DETECTED Final   Bacteroides fragilis NOT DETECTED NOT DETECTED Final   Enterobacterales DETECTED (A) NOT DETECTED Final    Comment: Enterobacterales represent a large order of gram negative bacteria, not a single organism. CRITICAL RESULT CALLED TO,  READ BACK BY AND VERIFIED WITH: PHARMD A. P [id number] FCP    Enterobacter cloacae complex NOT DETECTED NOT DETECTED Final   Escherichia coli NOT DETECTED NOT DETECTED Final   Klebsiella aerogenes NOT DETECTED NOT DETECTED Final   Klebsiella oxytoca NOT DETECTED NOT DETECTED Final   Klebsiella pneumoniae DETECTED (A) NOT DETECTED Final    Comment: CRITICAL RESULT CALLED TO, READ BACK BY AND VERIFIED WITH: PHARMD A. P [id number] FCP    Proteus species NOT DETECTED NOT DETECTED Final   Salmonella species NOT DETECTED NOT DETECTED Final   Serratia marcescens NOT DETECTED NOT DETECTED Final   Haemophilus influenzae NOT DETECTED NOT DETECTED Final   Neisseria meningitidis NOT DETECTED NOT DETECTED Final   Pseudomonas aeruginosa NOT DETECTED NOT DETECTED Final   Stenotrophomonas maltophilia NOT DETECTED NOT DETECTED Final   Candida albicans NOT DETECTED NOT DETECTED Final   Candida auris NOT DETECTED NOT DETECTED  Final   Candida glabrata NOT DETECTED NOT DETECTED Final   Candida krusei NOT DETECTED NOT DETECTED Final   Candida parapsilosis NOT DETECTED NOT DETECTED Final   Candida tropicalis NOT DETECTED NOT DETECTED Final   Cryptococcus neoformans/gattii NOT DETECTED NOT DETECTED Final   CTX-M ESBL NOT DETECTED NOT DETECTED Final   Carbapenem resistance IMP NOT DETECTED NOT DETECTED Final   Carbapenem resistance KPC NOT DETECTED NOT DETECTED Final   Carbapenem resistance NDM NOT DETECTED NOT DETECTED Final   Carbapenem resist OXA 48 LIKE NOT DETECTED NOT DETECTED Final   Carbapenem resistance VIM NOT DETECTED NOT DETECTED Final    Comment: Performed at [facility], [address], [place], [place] [address]  MRSA Next Gen by PCR, Nasal     Status: None   Collection Time: 11/03/22  2:14 AM   Specimen: Nasal Mucosa; Nasal Swab  Result Value Ref Range Status   MRSA by PCR Next Gen NOT DETECTED NOT DETECTED Final    Comment: (NOTE) The GeneXpert MRSA Assay (FDA approved for  NASAL specimens only), is one component of a comprehensive MRSA colonization surveillance program. It is not intended to diagnose MRSA infection nor to guide or monitor treatment for MRSA infections. Test performance is not FDA approved in patients less than 12 years old. Performed at [facility], [address], [place], [place] [address]   Blood Culture (routine x 2)     Status: None   Collection Time: 11/03/22  3:10 AM   Specimen: BLOOD  Result Value Ref Range Status   Specimen Description   Final    BLOOD BLOOD RIGHT HAND Performed at [facility], [address], [place], [place] [address]    Special Requests   Final    BOTTLES DRAWN AEROBIC AND ANAEROBIC Blood Culture adequate volume Performed at [facility], [address], [place], [place] [address]    Culture   Final    NO GROWTH 5 DAYS Performed at [facility], [address] [place], [place] [address]    Report Status 11/08/2022 FINAL  Final  Culture, blood (x 2)     Status: None   Collection Time: 11/03/22  7:28 AM   Specimen: BLOOD LEFT HAND  Result Value Ref Range Status   Specimen Description   Final    BLOOD LEFT HAND Performed at [facility], [address] [place], [place] [address]    Special Requests   Final    AEROBIC BOTTLE ONLY Blood Culture adequate volume Performed at [facility], [address], [place], [place] [address]    Culture   Final    NO GROWTH 5 DAYS Performed at [facility], [address], [place], [place] [address]    Report Status 11/08/2022 FINAL  Final  Culture, blood (x 2)     Status: None   Collection Time: 11/03/22  7:39 AM   Specimen: BLOOD RIGHT HAND  Result Value Ref Range Status   Specimen Description   Final    BLOOD RIGHT HAND Performed at [facility], [address] [place], [place] [address]    Special Requests   Final    AEROBIC BOTTLE ONLY Blood Culture  adequate volume Performed at [facility], [address] [place], [place] [address]    Culture   Final    NO GROWTH 5 DAYS Performed at [facility], [address] [place], [place] [address]    Report Status 11/08/2022 FINAL  Final     Labs: BNP (last 3 results) No results for input(s): "BNP" in the last 8760 hours. Basic Metabolic Panel: Recent Labs  Lab 11/03/22 0728 11/04/22 0630 11/05/22 0556 11/06/22 0942 11/07/22 0320 11/08/22 0229  NA  --  134* 136 134* 134* 134*  K  --  2.8* 2.8* 2.9* 2.6* 4.1  CL  --  106 108 103 104 105  CO2  --  21* 20* 21* 23 23  GLUCOSE  --  87 87 102* 102* 105*  BUN  --  8 <5* <5* <5* <5*  CREATININE  --  0.49 0.47 0.51 0.38* 0.42*  CALCIUM  --  7.0* 7.2* 7.8* 7.5* 7.8*  MG 1.4* 2.1 2.0 1.7 1.7 1.8  PHOS 2.5  --   --   --   --   --    Liver Function Tests: Recent Labs  Lab 11/04/22 0630 11/05/22 0556 11/06/22 0942 11/07/22 0320 11/08/22 0229  AST 25 23 18 15 15   ALT 21 18 16 13 13   ALKPHOS 133* 179* 195* 152* 143*  BILITOT 1.4* 1.1 1.6* 1.2 1.3*  PROT 5.0* 5.7* 6.1* 5.5* 6.1*  ALBUMIN 2.1* 2.2* 2.4* 2.2* 2.2*   No results for input(s): "LIPASE", "AMYLASE" in the last 168 hours. No results for input(s): "AMMONIA" in the last 168 hours. CBC: Recent Labs  Lab 11/04/22 0630 11/05/22 0556 11/06/22 0942 11/07/22 0320 11/08/22 0229  WBC 1.3* 1.0* 0.8* 0.7* 0.8*  NEUTROABS 0.8* 0.5* 0.4* 0.2* 0.2*  HGB 7.5* 7.9* 8.3* 7.6* 8.0*  HCT 24.5* 24.9* 26.3* 23.5* 25.1*  MCV 92.5 92.6 90.1 89.7 90.9  PLT 91* 71* 61* 47* 51*   Cardiac Enzymes: No results for input(s): "CKTOTAL", "CKMB", "CKMBINDEX", "TROPONINI" in the last 168 hours. BNP: Invalid input(s): "POCBNP" CBG: No results for input(s): "GLUCAP" in the last 168 hours. D-Dimer No results for input(s): "DDIMER" in the last 72 hours. Hgb A1c No results for input(s): "HGBA1C" in the last 72 hours. Lipid Profile No results for input(s): "CHOL",  "HDL", "LDLCALC", "TRIG", "CHOLHDL", "LDLDIRECT" in the last 72 hours. Thyroid function studies No results for input(s): "TSH", "T4TOTAL", "T3FREE", "THYROIDAB" in the last 72 hours.  Invalid input(s): "FREET3" Anemia work up No results for input(s): "VITAMINB12", "FOLATE", "FERRITIN", "TIBC", "IRON", "RETICCTPCT" in the last 72 hours. Urinalysis    Component Value Date/Time   COLORURINE YELLOW 11/02/2022 1900   APPEARANCEUR CLEAR 11/02/2022 1900   LABSPEC 1.010 11/02/2022 1900   PHURINE 5.5 11/02/2022 1900   GLUCOSEU NEGATIVE 11/02/2022 1900   HGBUR NEGATIVE 11/02/2022 1900   BILIRUBINUR NEGATIVE 11/02/2022 1900   BILIRUBINUR negative 01/23/2019 1525   KETONESUR NEGATIVE 11/02/2022 1900   PROTEINUR NEGATIVE 11/02/2022 1900   UROBILINOGEN 0.2 01/23/2019 1525   NITRITE NEGATIVE 11/02/2022 1900   LEUKOCYTESUR NEGATIVE 11/02/2022 1900   Sepsis Labs Recent Labs  Lab 11/05/22 0556 11/06/22 0942 11/07/22 0320 11/08/22 0229  WBC 1.0* 0.8* 0.7* 0.8*   Microbiology Recent Results (from the past 240 hour(s))  Blood Culture (routine x 2)     Status: Abnormal   Collection Time: 11/02/22  2:37 PM   Specimen: BLOOD  Result Value Ref Range Status   Specimen Description   Final    BLOOD RIGHT ANTECUBITAL Performed at [facility], [address] [place], [place] [address]    Special Requests   Final    BOTTLES DRAWN AEROBIC AND  ANAEROBIC Blood Culture results may not be optimal due to an inadequate volume of blood received in culture bottles Performed at [facility], [address] [place], [place] [address]    Culture  Setup Time   Final    GRAM NEGATIVE RODS IN BOTH AEROBIC AND ANAEROBIC BOTTLES CRITICAL RESULT CALLED TO, READ BACK BY AND VERIFIED WITH: Malcolm Metro [id number] FCP Performed at [facility], [address] [place], [place] [address]    Culture KLEBSIELLA PNEUMONIAE (A)  Final   Report Status 11/05/2022 FINAL  Final   Organism  ID, Bacteria KLEBSIELLA PNEUMONIAE  Final      Susceptibility   Klebsiella pneumoniae - MIC*    AMPICILLIN >=32 RESISTANT Resistant     CEFEPIME <=0.12 SENSITIVE Sensitive     CEFTAZIDIME <=1 SENSITIVE Sensitive     CEFTRIAXONE <=0.25 SENSITIVE Sensitive     CIPROFLOXACIN <=0.25 SENSITIVE Sensitive     GENTAMICIN <=1 SENSITIVE Sensitive     IMIPENEM <=0.25 SENSITIVE Sensitive     TRIMETH/SULFA <=20 SENSITIVE Sensitive     AMPICILLIN/SULBACTAM 4 SENSITIVE Sensitive     PIP/TAZO <=4 SENSITIVE Sensitive     * KLEBSIELLA PNEUMONIAE  Blood Culture ID Panel (Reflexed)     Status: Abnormal   Collection Time: 11/02/22  2:37 PM  Result Value Ref Range Status   Enterococcus faecalis NOT DETECTED NOT DETECTED Final   Enterococcus Faecium NOT DETECTED NOT DETECTED Final   Listeria monocytogenes NOT DETECTED NOT DETECTED Final   Staphylococcus species NOT DETECTED NOT DETECTED Final   Staphylococcus aureus (BCID) NOT DETECTED NOT DETECTED Final   Staphylococcus epidermidis NOT DETECTED NOT DETECTED Final   Staphylococcus lugdunensis NOT DETECTED NOT DETECTED Final   Streptococcus species NOT DETECTED NOT DETECTED Final   Streptococcus agalactiae NOT DETECTED NOT DETECTED Final   Streptococcus pneumoniae NOT DETECTED NOT DETECTED Final   Streptococcus pyogenes NOT DETECTED NOT DETECTED Final   A.calcoaceticus-baumannii NOT DETECTED NOT DETECTED Final   Bacteroides fragilis NOT DETECTED NOT DETECTED Final   Enterobacterales DETECTED (A) NOT DETECTED Final    Comment: Enterobacterales represent a large order of gram negative bacteria, not a single organism. CRITICAL RESULT CALLED TO, READ BACK BY AND VERIFIED WITH: PHARMD A. P [id number] FCP    Enterobacter cloacae complex NOT DETECTED NOT DETECTED Final   Escherichia coli NOT DETECTED NOT DETECTED Final   Klebsiella aerogenes NOT DETECTED NOT DETECTED Final   Klebsiella oxytoca NOT DETECTED NOT DETECTED Final   Klebsiella pneumoniae DETECTED  (A) NOT DETECTED Final    Comment: CRITICAL RESULT CALLED TO, READ BACK BY AND VERIFIED WITH: PHARMD A. P [id number] FCP    Proteus species NOT DETECTED NOT DETECTED Final   Salmonella species NOT DETECTED NOT DETECTED Final   Serratia marcescens NOT DETECTED NOT DETECTED Final   Haemophilus influenzae NOT DETECTED NOT DETECTED Final   Neisseria meningitidis NOT DETECTED NOT DETECTED Final   Pseudomonas aeruginosa NOT DETECTED NOT DETECTED Final   Stenotrophomonas maltophilia NOT DETECTED NOT DETECTED Final   Candida albicans NOT DETECTED NOT DETECTED Final   Candida auris NOT DETECTED NOT DETECTED Final   Candida glabrata NOT DETECTED NOT DETECTED Final   Candida krusei NOT DETECTED NOT DETECTED Final   Candida parapsilosis NOT DETECTED NOT DETECTED Final   Candida tropicalis NOT DETECTED NOT DETECTED Final   Cryptococcus neoformans/gattii NOT DETECTED NOT DETECTED Final   CTX-M ESBL NOT DETECTED NOT DETECTED Final   Carbapenem  resistance IMP NOT DETECTED NOT DETECTED Final   Carbapenem resistance KPC NOT DETECTED NOT DETECTED Final   Carbapenem resistance NDM NOT DETECTED NOT DETECTED Final   Carbapenem resist OXA 48 LIKE NOT DETECTED NOT DETECTED Final   Carbapenem resistance VIM NOT DETECTED NOT DETECTED Final    Comment: Performed at [facility], [address], [place], [place] [address]  MRSA Next Gen by PCR, Nasal     Status: None   Collection Time: 11/03/22  2:14 AM   Specimen: Nasal Mucosa; Nasal Swab  Result Value Ref Range Status   MRSA by PCR Next Gen NOT DETECTED NOT DETECTED Final    Comment: (NOTE) The GeneXpert MRSA Assay (FDA approved for NASAL specimens only), is one component of a comprehensive MRSA colonization surveillance program. It is not intended to diagnose MRSA infection nor to guide or monitor treatment for MRSA infections. Test performance is not FDA approved in patients less than 23 years old. Performed at [facility], [address] [place], [place] [address]   Blood Culture (routine x 2)     Status: None   Collection Time: 11/03/22  3:10 AM   Specimen: BLOOD  Result Value Ref Range Status   Specimen Description   Final    BLOOD BLOOD RIGHT HAND Performed at [facility], [address], [place], [place] [address]    Special Requests   Final    BOTTLES DRAWN AEROBIC AND ANAEROBIC Blood Culture adequate volume Performed at [facility], [address] [place], [place] [address]    Culture   Final    NO GROWTH 5 DAYS Performed at [facility], [address], [place], [place] [address]    Report Status 11/08/2022 FINAL  Final  Culture, blood (x 2)     Status: None   Collection Time: 11/03/22  7:28 AM   Specimen: BLOOD LEFT HAND  Result Value Ref Range Status   Specimen Description   Final    BLOOD LEFT HAND Performed at [facility], [address], [place], [place] [address]    Special Requests   Final    AEROBIC BOTTLE ONLY Blood Culture adequate volume Performed at [facility], [address] [place], [place] [address]    Culture   Final    NO GROWTH 5 DAYS Performed at [facility], [address], [place], [place] [address]    Report Status 11/08/2022 FINAL  Final  Culture, blood (x 2)     Status: None   Collection Time: 11/03/22  7:39 AM   Specimen: BLOOD RIGHT HAND  Result Value Ref Range Status   Specimen Description   Final    BLOOD RIGHT HAND Performed at [facility], [address] [place], [place] [address]    Special Requests   Final    AEROBIC BOTTLE ONLY Blood Culture adequate volume Performed at [facility], [address], [place], [place] [address]    Culture   Final    NO GROWTH 5 DAYS Performed at [facility], [address], [place], [place] [address]    Report Status 11/08/2022 FINAL  Final     SIGNED:   Marinda Elk, MD  [facility] 11/08/2022, 10:47 AM Pager   If 7PM-7AM, please contact night-coverage [email or web address] Password [email or web address]

## 2022-11-08 NOTE — Progress Notes (Addendum)
She is feeling better.  I am happy about that.  Her pain seems to be doing better with the adjustments of her pain medication.  I do think that we can probably get her home today.  I know this would do well for her overall mental health.  Her white cell count is 800.  Hemoglobin 8.  Platelet count is 51,000.  I know her ANC is still on the low side at 200.  She is on Duracef.  I would make sure that she goes home on Duracef.  She ate a little bit better.  She is walking a little bit.  Her colostomy is working well.  There is no cough or shortness of breath.  She has had no obvious bleeding.  Her vital signs show temperature of 98.1.  Pulse 92.  Blood pressure 111/74.  Head exam shows no mucositis.  There is no thrush.  There is no adenopathy in the neck.  Lungs are clear bilaterally.  She has good air movement bilaterally.  Cardiac exam regular rate and rhythm.  Abdomen soft.  Bowel sounds are present.  She does have a the colostomy that is intact.  There is no fluid wave.  There is no palpable liver or spleen tip.  Extremity shows no clubbing, cyanosis or edema.  Skin exam shows no rashes.  Neurological exam is nonfocal.   I do think that we can get Ms. Berres home today.  I I realize that her blood counts are still on the low side.  However, I suspect that they might be starting to come back.  I much better her be at home than in the hospital.  Her pain is better controlled.  I think that when she gets a kyphoplasty at L1, this will help quite a bit.  I know she would like to go home.  Again I think this will do a whole lot of good for her overall cognitive abilities.  I do appreciate the great care that she has had from everybody up on [address]  Christin Bach, MD   Jeri [place] 33:3

## 2022-11-08 NOTE — Progress Notes (Signed)
Discharge education provided to patient and family. Both verbalized understanding.

## 2022-11-09 ENCOUNTER — Inpatient Hospital Stay: Payer: Medicaid Other

## 2022-11-09 ENCOUNTER — Other Ambulatory Visit (HOSPITAL_COMMUNITY): Payer: Medicaid Other

## 2022-11-09 ENCOUNTER — Telehealth: Payer: Self-pay

## 2022-11-09 ENCOUNTER — Other Ambulatory Visit (HOSPITAL_BASED_OUTPATIENT_CLINIC_OR_DEPARTMENT_OTHER): Payer: Self-pay

## 2022-11-09 VITALS — BP 102/56 | HR 86 | Temp 98.2°F | Resp 17

## 2022-11-09 DIAGNOSIS — Z515 Encounter for palliative care: Secondary | ICD-10-CM

## 2022-11-09 DIAGNOSIS — C189 Malignant neoplasm of colon, unspecified: Secondary | ICD-10-CM

## 2022-11-09 DIAGNOSIS — Z5112 Encounter for antineoplastic immunotherapy: Secondary | ICD-10-CM | POA: Diagnosis present

## 2022-11-09 DIAGNOSIS — Z5189 Encounter for other specified aftercare: Secondary | ICD-10-CM | POA: Diagnosis not present

## 2022-11-09 DIAGNOSIS — C182 Malignant neoplasm of ascending colon: Secondary | ICD-10-CM | POA: Diagnosis present

## 2022-11-09 DIAGNOSIS — R530 Neoplastic (malignant) related fatigue: Secondary | ICD-10-CM

## 2022-11-09 DIAGNOSIS — C787 Secondary malignant neoplasm of liver and intrahepatic bile duct: Secondary | ICD-10-CM | POA: Diagnosis not present

## 2022-11-09 DIAGNOSIS — Z452 Encounter for adjustment and management of vascular access device: Secondary | ICD-10-CM | POA: Diagnosis not present

## 2022-11-09 DIAGNOSIS — Z7189 Other specified counseling: Secondary | ICD-10-CM

## 2022-11-09 DIAGNOSIS — Z95828 Presence of other vascular implants and grafts: Secondary | ICD-10-CM

## 2022-11-09 DIAGNOSIS — G893 Neoplasm related pain (acute) (chronic): Secondary | ICD-10-CM

## 2022-11-09 DIAGNOSIS — Z5111 Encounter for antineoplastic chemotherapy: Secondary | ICD-10-CM | POA: Diagnosis present

## 2022-11-09 LAB — CBC WITH DIFFERENTIAL (CANCER CENTER ONLY)
Abs Immature Granulocytes: 0.01 10*3/uL (ref 0.00–0.07)
Basophils Absolute: 0 10*3/uL (ref 0.0–0.1)
Basophils Relative: 2 %
Eosinophils Absolute: 0 10*3/uL (ref 0.0–0.5)
Eosinophils Relative: 2 %
HCT: 29.9 % — ABNORMAL LOW (ref 36.0–46.0)
Hemoglobin: 9.5 g/dL — ABNORMAL LOW (ref 12.0–15.0)
Immature Granulocytes: 1 %
Lymphocytes Relative: 41 %
Lymphs Abs: 0.6 10*3/uL — ABNORMAL LOW (ref 0.7–4.0)
MCH: 28.4 pg (ref 26.0–34.0)
MCHC: 31.8 g/dL (ref 30.0–36.0)
MCV: 89.5 fL (ref 80.0–100.0)
Monocytes Absolute: 0.3 10*3/uL (ref 0.1–1.0)
Monocytes Relative: 19 %
Neutro Abs: 0.5 10*3/uL — ABNORMAL LOW (ref 1.7–7.7)
Neutrophils Relative %: 35 %
Platelet Count: 84 10*3/uL — ABNORMAL LOW (ref 150–400)
RBC: 3.34 MIL/uL — ABNORMAL LOW (ref 3.87–5.11)
RDW: 17.4 % — ABNORMAL HIGH (ref 11.5–15.5)
Smear Review: NORMAL
WBC Count: 1.3 10*3/uL — ABNORMAL LOW (ref 4.0–10.5)
nRBC: 0 % (ref 0.0–0.2)

## 2022-11-09 LAB — CMP (CANCER CENTER ONLY)
ALT: 11 U/L (ref 0–44)
AST: 16 U/L (ref 15–41)
Albumin: 3.5 g/dL (ref 3.5–5.0)
Alkaline Phosphatase: 201 U/L — ABNORMAL HIGH (ref 38–126)
Anion gap: 9 (ref 5–15)
BUN: <5 mg/dL — ABNORMAL LOW (ref 6–20)
CO2: 25 mmol/L (ref 22–32)
Calcium: 9.4 mg/dL (ref 8.9–10.3)
Chloride: 103 mmol/L (ref 98–111)
Creatinine: 0.51 mg/dL (ref 0.44–1.00)
GFR, Estimated: >60 mL/min (ref >60)
Glucose, Bld: 128 mg/dL — ABNORMAL HIGH (ref 70–99)
Potassium: 3.7 mmol/L (ref 3.5–5.1)
Sodium: 137 mmol/L (ref 135–145)
Total Bilirubin: 1.6 mg/dL — ABNORMAL HIGH (ref 0.3–1.2)
Total Protein: 6.9 g/dL (ref 6.5–8.1)

## 2022-11-09 LAB — SAMPLE TO BLOOD BANK

## 2022-11-09 MED ORDER — SODIUM CHLORIDE 0.9 % IV SOLN: INTRAVENOUS | Status: AC

## 2022-11-09 MED ORDER — HEPARIN SOD (PORK) LOCK FLUSH 100 UNIT/ML IV SOLN
500.0000 [IU] | Freq: Once | INTRAVENOUS | Status: AC
  Administered 2022-11-09: 500 [IU] via INTRAVENOUS

## 2022-11-09 MED ORDER — SODIUM CHLORIDE 0.9% FLUSH
10.0000 mL | Freq: Once | INTRAVENOUS | Status: AC
  Administered 2022-11-09: 10 mL via INTRAVENOUS

## 2022-11-09 MED ORDER — HYDROMORPHONE HCL 1 MG/ML IJ SOLN
1.0000 mg | Freq: Once | INTRAMUSCULAR | Status: AC
  Administered 2022-11-09: 1 mg via INTRAVENOUS
  Filled 2022-11-09: qty 1

## 2022-11-09 NOTE — Patient Instructions (Addendum)
Dehydration, Adult Dehydration is a condition in which there is not enough water or other fluids in the body. This happens when a person loses more fluids than they take in. Important organs, such as the kidneys, brain, and heart, cannot function without a proper amount of fluids. Any loss of fluids from the body can lead to dehydration. Dehydration can be mild, moderate, or severe. It should be treated right away to prevent it from becoming severe. What are the causes? Dehydration may be caused by: Health conditions, such as diarrhea, vomiting, fever, infection, or sweating or urinating a lot. Not drinking enough fluids. Certain medicines, such as medicines that remove excess fluid from the body (diuretics). Lack of safe drinking water. Not being able to get enough water and food. What increases the risk? The following factors may make you more likely to develop this condition: Having a long-term (chronic) illness that has not been treated properly, such as diabetes, heart disease, or kidney disease. Being 65 years of age or older. Having a disability. Living in a place that is high in altitude, where thinner, drier air causes more fluid loss. Doing exercises that put stress on your body for a long time (endurance sports). Being active in a hot climate. What are the signs or symptoms? Symptoms of dehydration depend on how severe it is. Mild or moderate dehydration Thirst. Dry lips or dry mouth. Dizziness or light-headedness. Muscle cramps. Dark urine. Urine may be the color of tea. Less urine or tears produced than usual. Headache. Severe dehydration Changes in skin. Your skin may be cold and clammy, blotchy, or pale. Your skin also may not return to normal after being lightly pinched and released. Little or no tears, urine, or sweat. Rapid breathing and low blood pressure. Your pulse may be weak or may be faster than 100 beats per minute when you are sitting still. Other changes,  such as: Feeling very thirsty. Sunken eyes. Cold hands and feet. Confusion. Being very tired (lethargic) or having trouble waking from sleep. Short-term weight loss. Loss of consciousness. How is this diagnosed? This condition is diagnosed based on your symptoms and a physical exam. You may have blood and urine tests to help confirm the diagnosis. How is this treated? Treatment for this condition depends on how severe it is. Treatment should be started right away. Do not wait until dehydration becomes severe. Severe dehydration is an emergency and needs to be treated in a hospital. Mild or moderate dehydration can be treated at home. You may be asked to: Drink more fluids. Drink an oral rehydration solution (ORS). This drink restores fluids, salts, and minerals in the blood (electrolytes). Stop any activities that caused dehydration, such as exercise. Cool off with cool compresses, cool mist, or cool fluids, if heat or too much sweat caused your condition. Take medicine to treat fever, if fever caused your condition. Take medicine to treat nausea and diarrhea, if vomiting or diarrhea caused your condition. Severe dehydration can be treated: With IV fluids. By correcting abnormal levels of electrolytes in your body. By treating the underlying cause of dehydration. Follow these instructions at home: Oral rehydration solution If told by your health care provider, drink an ORS: Make an ORS by following instructions on the package. Start by drinking small amounts, about  cup (120 mL) every 5-10 minutes. Slowly increase how much you drink until you have taken the amount recommended by your health care provider.  Eating and drinking  Drink enough clear fluid to keep   your urine pale yellow. If you were told to drink an ORS, finish the ORS first and then start slowly drinking other clear fluids. Drink fluids such as: Water. Do not drink only water. Doing that can lead to hyponatremia, which  is having too little salt (sodium) in the body. Water from ice chips you suck on. Diluted fruit juice. This is fruit juice that you have added water to. Low-calorie sports drinks. Eat foods that contain a healthy balance of electrolytes, such as bananas, oranges, potatoes, tomatoes, and spinach. Do not drink alcohol. Avoid the following: Drinks that contain a lot of sugar. These include high-calorie sports drinks, fruit juice that is not diluted, and soda. Caffeine. Foods that are greasy or contain a lot of fat or sugar. General instructions Take over-the-counter and prescription medicines only as told by your health care provider. Do not take sodium tablets. Doing that can lead to having too much sodium in the body (hypernatremia). Return to your normal activities as told by your health care provider. Ask your health care provider what activities are safe for you. Keep all follow-up visits. Your health care provider may need to check your progress and suggest new ways to treat your condition. Contact a health care provider if: You have muscle cramps, pain, or discomfort, such as: Pain in your abdomen and the pain gets worse or stays in one area. Stiff neck. You have a rash. You are more irritable than usual. You are sleepier or have a harder time waking. You feel weak or dizzy. You feel very thirsty. Get help right away if: You have symptoms of severe dehydration. You vomit every time you eat or drink. Your vomiting gets worse, does not go away, or includes blood or green matter (bile). You are getting treatment but symptoms are getting worse. You have a fever. You have a severe headache. You have: Diarrhea that gets worse or does not go away. Blood in your stool. This may cause stool to look black and tarry. Not urinating, or urinating only a small amount of very dark urine, within 6-8 hours. You have trouble breathing. These symptoms may be an emergency. Get help right  away. Do not wait to see if the symptoms will go away. Do not drive yourself to the hospital. Call 911. This information is not intended to replace advice given to you by your health care provider. Make sure you discuss any questions you have with your health care provider. Document Revised: 12/11/2021 Document Reviewed: 12/11/2021 Elsevier Patient Education  2024 [employer]vier Inc.   Implanted [facility] Guide An implanted port is a device that is placed under the skin. It is usually placed in the chest. The device may vary based on the need. Implanted ports can be used to give IV medicine, to take blood, or to give fluids. You may have an implanted port if: You need IV medicine that would be irritating to the small veins in your hands or arms. You need IV medicines, such as chemotherapy, for a long period of time. You need IV nutrition for a long period of time. You may have fewer limitations when using a port than you would if you used other types of long-term IVs. You will also likely be able to return to normal activities after your incision heals. An implanted port has two main parts: Reservoir. The reservoir is the part where a needle is inserted to give medicines or draw blood. The reservoir is round. After the port is placed,  it appears as a small, raised area under your skin. Catheter. The catheter is a small, thin tube that connects the reservoir to a vein. Medicine that is inserted into the reservoir goes into the catheter and then into the vein. How is my port accessed? To access your port: A numbing cream may be placed on the skin over the port site. Your health care provider will put on a mask and sterile gloves. The skin over your port will be cleaned carefully with a germ-killing soap and allowed to dry. Your health care provider will gently pinch the port and insert a needle into it. Your health care provider will check for a blood return to make sure the port is in the vein and  is still working (patent). If your port needs to remain accessed to get medicine continuously (constant infusion), your health care provider will place a clear bandage (dressing) over the needle site. The dressing and needle will need to be changed every week, or as told by your health care provider. What is flushing? Flushing helps keep the port working. Follow instructions from your health care provider about how and when to flush the port. Ports are usually flushed with saline solution or a medicine called heparin. The need for flushing will depend on how the port is used: If the port is only used from time to time to give medicines or draw blood, the port may need to be flushed: Before and after medicines have been given. Before and after blood has been drawn. As part of routine maintenance. Flushing may be recommended every 4-6 weeks. If a constant infusion is running, the port may not need to be flushed. Throw away any syringes in a disposal container that is meant for sharp items (sharps container). You can buy a sharps container from a pharmacy, or you can make one by using an empty hard plastic bottle with a cover. How long will my port stay implanted? The port can stay in for as long as your health care provider thinks it is needed. When it is time for the port to come out, a surgery will be done to remove it. The surgery will be similar to the procedure that was done to put the port in. Follow these instructions at home: Caring for your port and port site Flush your port as told by your health care provider. If you need an infusion over several days, follow instructions from your health care provider about how to take care of your port site. Make sure you: Change your dressing as told by your health care provider. Wash your hands with soap and water for at least 20 seconds before and after you change your dressing. If soap and water are not available, use alcohol-based hand  sanitizer. Place any used dressings or infusion bags into a plastic bag. Throw that bag in the trash. Keep the dressing that covers the needle clean and dry. Do not get it wet. Do not use scissors or sharp objects near the infusion tubing. Keep any external tubes clamped, unless they are being used. Check your port site every day for signs of infection. Check for: Redness, swelling, or pain. Fluid or blood. Warmth. Pus or a bad smell. Protect the skin around the port site. Avoid wearing bra straps that rub or irritate the site. Protect the skin around your port from seat belts. Place a soft pad over your chest if needed. Bathe or shower as told by your health care provider.  The site may get wet as long as you are not actively receiving an infusion. General instructions  Return to your normal activities as told by your health care provider. Ask your health care provider what activities are safe for you. Carry a medical alert card or wear a medical alert bracelet at all times. This will let health care providers know that you have an implanted port in case of an emergency. Where to find more information American Cancer Society: www.cancer.org American Society of Clinical Oncology: www.cancer.net Contact a health care provider if: You have a fever or chills. You have redness, swelling, or pain at the port site. You have fluid or blood coming from your port site. Your incision feels warm to the touch. You have pus or a bad smell coming from the port site. Summary Implanted ports are usually placed in the chest for long-term IV access. Follow instructions from your health care provider about flushing the port and changing bandages (dressings). Take care of the area around your port by avoiding clothing that puts pressure on the area, and by watching for signs of infection. Protect the skin around your port from seat belts. Place a soft pad over your chest if needed. Contact a health care  provider if you have a fever or you have redness, swelling, pain, fluid, or a bad smell at the port site. This information is not intended to replace advice given to you by your health care provider. Make sure you discuss any questions you have with your health care provider. Document Revised: 11/15/2020 Document Reviewed: 11/15/2020 Elsevier Patient Education  2024 [employer].

## 2022-11-09 NOTE — Progress Notes (Signed)
Pt here for port flush and labs. Recent D/c from hospital d/t sepsis. BP 92/42, reviewed with MD, 1L NS to be given while waiting on labs.

## 2022-11-09 NOTE — Transitions of Care (Post Inpatient/ED Visit) (Unsigned)
   11/09/2022  Name: Blen Herter MRN: [id number] DOB: Oct 31, 1965  Today's TOC FU Call Status: Today's TOC FU Call Status:: Unsuccessul Call (1st Attempt) Unsuccessful Call (1st Attempt) Date: 11/09/22  Attempted to reach the patient regarding the most recent Inpatient/ED visit.  Follow Up Plan: Additional outreach attempts will be made to reach the patient to complete the Transitions of Care (Post Inpatient/ED visit) call.   Signature  Fredirick Maudlin

## 2022-11-12 ENCOUNTER — Other Ambulatory Visit: Payer: Self-pay | Admitting: Hematology & Oncology

## 2022-11-12 ENCOUNTER — Other Ambulatory Visit (HOSPITAL_BASED_OUTPATIENT_CLINIC_OR_DEPARTMENT_OTHER): Payer: Self-pay

## 2022-11-12 ENCOUNTER — Encounter: Payer: Self-pay | Admitting: *Deleted

## 2022-11-12 DIAGNOSIS — C189 Malignant neoplasm of colon, unspecified: Secondary | ICD-10-CM

## 2022-11-12 DIAGNOSIS — K1379 Other lesions of oral mucosa: Secondary | ICD-10-CM

## 2022-11-12 MED ORDER — NYSTATIN 100000 UNIT/ML MT SUSP
5.0000 mL | Freq: Four times a day (QID) | OROMUCOSAL | 0 refills | Status: DC | PRN
  Filled 2022-11-12: qty 240, 12d supply, fill #0

## 2022-11-13 ENCOUNTER — Other Ambulatory Visit (HOSPITAL_BASED_OUTPATIENT_CLINIC_OR_DEPARTMENT_OTHER): Payer: Self-pay

## 2022-11-14 ENCOUNTER — Inpatient Hospital Stay: Payer: Medicaid Other

## 2022-11-14 ENCOUNTER — Other Ambulatory Visit: Payer: Self-pay

## 2022-11-14 ENCOUNTER — Other Ambulatory Visit (HOSPITAL_BASED_OUTPATIENT_CLINIC_OR_DEPARTMENT_OTHER): Payer: Self-pay

## 2022-11-14 ENCOUNTER — Inpatient Hospital Stay (HOSPITAL_BASED_OUTPATIENT_CLINIC_OR_DEPARTMENT_OTHER): Payer: Medicaid Other | Admitting: Hematology & Oncology

## 2022-11-14 ENCOUNTER — Inpatient Hospital Stay: Payer: Medicaid Other | Admitting: Licensed Clinical Social Worker

## 2022-11-14 VITALS — BP 118/70 | HR 95 | Temp 97.9°F | Resp 20 | Ht 61.0 in | Wt 154.0 lb

## 2022-11-14 VITALS — BP 118/70 | HR 98 | Temp 97.8°F | Resp 18

## 2022-11-14 DIAGNOSIS — C189 Malignant neoplasm of colon, unspecified: Secondary | ICD-10-CM

## 2022-11-14 DIAGNOSIS — Z5112 Encounter for antineoplastic immunotherapy: Secondary | ICD-10-CM | POA: Diagnosis not present

## 2022-11-14 DIAGNOSIS — C787 Secondary malignant neoplasm of liver and intrahepatic bile duct: Secondary | ICD-10-CM | POA: Diagnosis not present

## 2022-11-14 DIAGNOSIS — Z95828 Presence of other vascular implants and grafts: Secondary | ICD-10-CM

## 2022-11-14 LAB — CBC WITH DIFFERENTIAL (CANCER CENTER ONLY)
Abs Immature Granulocytes: 0.06 10*3/uL (ref 0.00–0.07)
Basophils Absolute: 0 10*3/uL (ref 0.0–0.1)
Basophils Relative: 1 %
Eosinophils Absolute: 0.1 10*3/uL (ref 0.0–0.5)
Eosinophils Relative: 1 %
HCT: 31 % — ABNORMAL LOW (ref 36.0–46.0)
Hemoglobin: 9.6 g/dL — ABNORMAL LOW (ref 12.0–15.0)
Immature Granulocytes: 1 %
Lymphocytes Relative: 21 %
Lymphs Abs: 0.9 10*3/uL (ref 0.7–4.0)
MCH: 28.2 pg (ref 26.0–34.0)
MCHC: 31 g/dL (ref 30.0–36.0)
MCV: 90.9 fL (ref 80.0–100.0)
Monocytes Absolute: 0.6 10*3/uL (ref 0.1–1.0)
Monocytes Relative: 13 %
Neutro Abs: 2.7 10*3/uL (ref 1.7–7.7)
Neutrophils Relative %: 63 %
Platelet Count: 184 10*3/uL (ref 150–400)
RBC: 3.41 MIL/uL — ABNORMAL LOW (ref 3.87–5.11)
RDW: 18.3 % — ABNORMAL HIGH (ref 11.5–15.5)
WBC Count: 4.3 10*3/uL (ref 4.0–10.5)
nRBC: 0 % (ref 0.0–0.2)

## 2022-11-14 LAB — CMP (CANCER CENTER ONLY)
ALT: 11 U/L (ref 0–44)
AST: 21 U/L (ref 15–41)
Albumin: 3.5 g/dL (ref 3.5–5.0)
Alkaline Phosphatase: 304 U/L — ABNORMAL HIGH (ref 38–126)
Anion gap: 10 (ref 5–15)
BUN: 7 mg/dL (ref 6–20)
CO2: 23 mmol/L (ref 22–32)
Calcium: 9.3 mg/dL (ref 8.9–10.3)
Chloride: 105 mmol/L (ref 98–111)
Creatinine: 0.49 mg/dL (ref 0.44–1.00)
GFR, Estimated: >60 mL/min (ref >60)
Glucose, Bld: 135 mg/dL — ABNORMAL HIGH (ref 70–99)
Potassium: 3.2 mmol/L — ABNORMAL LOW (ref 3.5–5.1)
Sodium: 138 mmol/L (ref 135–145)
Total Bilirubin: 1.1 mg/dL (ref 0.3–1.2)
Total Protein: 7 g/dL (ref 6.5–8.1)

## 2022-11-14 LAB — CEA (IN HOUSE-CHCC): CEA (CHCC-In House): 9.26 ng/mL — ABNORMAL HIGH (ref 0.00–5.00)

## 2022-11-14 LAB — FERRITIN: Ferritin: 512 ng/mL — ABNORMAL HIGH (ref 11–307)

## 2022-11-14 LAB — IRON AND IRON BINDING CAPACITY (CC-WL,HP ONLY)
Iron: 48 ug/dL (ref 28–170)
Saturation Ratios: 14 % (ref 10.4–31.8)
TIBC: 346 ug/dL (ref 250–450)
UIBC: 298 ug/dL (ref 148–442)

## 2022-11-14 MED ORDER — SODIUM CHLORIDE 0.9% FLUSH
10.0000 mL | Freq: Once | INTRAVENOUS | Status: AC
  Administered 2022-11-14: 10 mL via INTRAVENOUS

## 2022-11-14 MED ORDER — HEPARIN SOD (PORK) LOCK FLUSH 100 UNIT/ML IV SOLN
500.0000 [IU] | Freq: Once | INTRAVENOUS | Status: AC
  Administered 2022-11-14: 500 [IU] via INTRAVENOUS

## 2022-11-14 NOTE — Patient Instructions (Signed)
Implanted Port Rem[place], Care After The following information offers guidance on how to care for yourself after your procedure. Your health care provider may also give you more specific instructions. If you have problems or questions, contact your health care provider. What can I expect after the procedure? After the procedure, it is common to have: Soreness or pain near your incision. Some swelling or bruising near your incision. Follow these instructions at home: Medicines Take over-the-counter and prescription medicines only as told by your health care provider. If you were prescribed an antibiotic medicine, take it as told by your health care provider. Do not stop taking the antibiotic even if you start to feel better. Bathing Do not take baths, swim, or use a hot tub until your health care provider approves. Ask your health care provider if you can take showers. You may only be allowed to take sponge baths. Incision care  Follow instructions from your health care provider about how to take care of your incision. Make sure you: Wash your hands with soap and water for at least 20 seconds before and after you change your bandage (dressing). If soap and water are not available, use hand sanitizer. Change your dressing as told by your health care provider. Keep your dressing dry. Leave stitches (sutures), skin glue, or adhesive strips in place. These skin closures may need to stay in place for 2 weeks or longer. If adhesive strip edges start to loosen and curl up, you may trim the loose edges. Do not remove adhesive strips completely unless your health care provider tells you to do that. Check your incision area every day for signs of infection. Check for: More redness, swelling, or pain. More fluid or blood. Warmth. Pus or a bad smell. Activity Return to your normal activities as told by your health care provider. Ask your health care provider what activities are safe for you. You may have  to avoid lifting. Ask your health care provider how much you can safely lift. Do not do activities that involve lifting your arms over your head. Driving  If you were given a sedative during the procedure, it can affect you for several hours. Do not drive or operate machinery until your health care provider says that it is safe. If you did not receive a sedative, ask your health care provider when it is safe to drive. General instructions Do not use any products that contain nicotine or tobacco. These products include cigarettes, chewing tobacco, and vaping devices, such as e-cigarettes. These can delay healing after surgery. If you need help quitting, ask your health care provider. Keep all follow-up visits. This is important. Contact a health care provider if: You have a fever or chills. You have more redness, swelling, or pain around your incision. You have more fluid or blood coming from your incision. Your incision feels warm to the touch. You have pus or a bad smell coming from your incision. You have pain that is not relieved by your pain medicine. Get help right away if: You have chest pain. You have difficulty breathing. These symptoms may be an emergency. Get help right away. Call 911. Do not wait to see if the symptoms will go away. Do not drive yourself to the hospital. Summary After the procedure, it is common to have pain, soreness, swelling, or bruising near your incision. If you were prescribed an antibiotic medicine, take it as told by your health care provider. Do not stop taking the antibiotic even if you   start to feel better. If you were given a sedative during the procedure, it can affect you for several hours. Do not drive or operate machinery until your health care provider says that it is safe. Return to your normal activities as told by your health care provider. Ask your health care provider what activities are safe for you. This information is not intended to  replace advice given to you by your health care provider. Make sure you discuss any questions you have with your health care provider. Document Revised: 11/15/2020 Document Reviewed: 11/15/2020 Elsevier Patient Education  2024 [employer]  

## 2022-11-14 NOTE — Progress Notes (Signed)
Hematology and Oncology Follow Up Visit  Rebecca Bullock [id number] 04/25/1966 57 y.o. 11/14/2022   Principle Diagnosis:  Metastatic colorectal cancer-liver metastasis- BRAF (+)   Current Therapy:        Status post cycle 1 of chemotherapy with FOLFOXIRI/Avastin Encorafenib/Vectibix -- started on 06/19/2021, s/p cycle #6 -- d/c on 10/31/2021 Intrahepatic TACE -- 03/29/2022 FOLFOXIRI/Avastin-- s/p cycle  #5-- start on 02/19/2022, -oxaliplatin dropped on 04/03/2022 -- d/c on 07/03/2022 IV iron-Ferrlecit given on 05/10/2022  Lonsurf/Avastin -- s/p cycle #1 -- start on 07/07/2022 -DC on 08/15/2022 Zometa 4 mg IV every 3 months -next dose on 11/2022  XRT to L1 vertebral body --start on 07/10/2022 Fruquintinib 5 mg po q day (21/7) --  start on 04/`09/2022 --changed to 3 mg p.o. daily (21/7) on 09/25/2022 FOLFIRI/Avastin -- s/p cycle #2 -- start on 10/17/2022               Interim History:  Rebecca Bullock is here today for follow-up.  After the first cycle, she unfortunately ended up in the hospital with neutropenia.  I think she did have blood infection with Klebsiella.  She got through this.  While in the hospital, she did have her biliary catheter exchanged.  I am just glad that she is feeling better.  She does not want any treatment today.  She just wants to feel good for a while.  I certainly have no problems with this.  While she was in the hospital, Interventional Radiology did see her and said that they would do a kyphoplasty L1.  I think this would really help out with her pain.  Her blood counts are adequate enough now.  She is far enough out from the Klebsiella bacteremia that doing a kyphoplasty should be okay.  She is eating a little bit better.  She has had no nausea or vomiting.  Her colostomy is working well.  She has had no obvious bleeding.  There is no mouth sores.  Overall, I would say that her performance status is probably ECOG 2.   Medications:  Allergies as of 11/14/2022        Reactions   Augmentin [amoxicillin-pot Clavulanate] Diarrhea, Other (See Comments)   Extreme diarrhea, abdominal pain.   Irinotecan Other (See Comments)   Flushing, tingling, visual disturbance   Metronidazole Diarrhea, Other (See Comments)   Vancomycin Rash, Other (See Comments)   "Red Man Syndrome"   Latex Rash   Tape Rash   Wound Dressing Adhesive Rash        Medication List        Accurate as of November 14, 2022  9:32 AM. If you have any questions, ask your nurse or doctor.          alum & mag hydroxide-simeth suspension-nystatin suspension-diphenhydrAMINE liquid Take 5 mLs by mouth 4 (four) times daily as needed.   baclofen 10 MG tablet Commonly known as: LIORESAL Take 1 tablet (10 mg total) by mouth 3 (three) times daily. What changed:  when to take this reasons to take this   cefadroxil 500 MG capsule Commonly known as: DURICEF Take 2 capsules (1,000 mg total) by mouth 2 (two) times daily for 10 days.   dexamethasone 4 MG tablet Commonly known as: DECADRON Take 2 tablets (8 mg total) by mouth daily. Start the day after chemotherapy for 2 days. Take with food.   famotidine 40 MG tablet Commonly known as: PEPCID Take 1 tablet (40 mg total) by mouth 2 (two) times daily.  HYDROmorphone 2 MG tablet Commonly known as: Dilaudid Take 3 tablets (6 mg total) by mouth every 2 (two) hours as needed for severe pain.   LORazepam 0.5 MG tablet Commonly known as: ATIVAN Place 1 tablet (0.5 mg total) under the tongue every 4 (four) hours as needed for anxiety.   megestrol 40 MG/ML suspension Commonly known as: MEGACE Take 10 mLs (400 mg total) by mouth 2 (two) times daily.   meloxicam 7.5 MG tablet Commonly known as: Mobic Take 1 tablet (7.5 mg total) by mouth 2 (two) times daily.   midodrine 2.5 MG tablet Commonly known as: PROAMATINE Take 1 tablet (2.5 mg total) by mouth 3 (three) times daily with meals.   morphine 30 MG 12 hr tablet Commonly known as:  MS CONTIN Take 1 tablet (30 mg total) by mouth every 8 (eight) hours.   morphine 15 MG 12 hr tablet Commonly known as: MS CONTIN Take 1 tablet (15 mg total) by mouth every 8 (eight) hours.   Normal Saline Flush 0.9 % Soln Flush abdominal drain with 5 cc sterile saline once daily   OLANZapine 10 MG tablet Commonly known as: ZYPREXA Take 1 tablet (10 mg total) by mouth at bedtime.   ondansetron 4 MG tablet Commonly known as: ZOFRAN Take 1 tablet (4 mg total) by mouth every 6 (six) hours.   Tbo-Filgrastim 480 MCG/0.8ML Sosy injection Commonly known as: GRANIX Inject 0.8 mLs (480 mcg total) into the skin daily at 6 PM.   Transderm-Scop 1 MG/3DAYS Generic drug: scopolamine Place one patch behind the ear every three days.        Allergies:  Allergies  Allergen Reactions   Augmentin [Amoxicillin-Pot Clavulanate] Diarrhea and Other (See Comments)    Extreme diarrhea, abdominal pain.   Irinotecan Other (See Comments)    Flushing, tingling, visual disturbance   Metronidazole Diarrhea and Other (See Comments)   Vancomycin Rash and Other (See Comments)    "Red Man Syndrome"   Latex Rash   Tape Rash   Wound Dressing Adhesive Rash    Past Medical History, Surgical history, Social history, and Family History were reviewed and updated.  Review of Systems: Review of Systems  Constitutional: Negative.   HENT: Negative.    Eyes: Negative.   Respiratory: Negative.    Cardiovascular: Negative.   Gastrointestinal: Negative.        Constipation alternating with diarrhea.   Genitourinary: Negative.   Musculoskeletal: Negative.   Skin: Negative.   Neurological: Negative.   Endo/Heme/Allergies: Negative.   Psychiatric/Behavioral: Negative.       Physical Exam: Temperature is 98.  Pulse 95.  Blood pressure 118/70.  Weight is 154 pounds.      Wt Readings from Last 3 Encounters:  11/14/22 154 lb (69.9 kg)  11/05/22 164 lb 8 oz (74.6 kg)  11/01/22 162 lb 4.8 oz (73.6 kg)     Physical Exam Vitals reviewed.  Constitutional:      Comments: This is a well-developed and well-nourished white female.  There is no scleral icterus.    HENT:     Head: Normocephalic and atraumatic.  Eyes:     General: Scleral icterus present.     Pupils: Pupils are equal, round, and reactive to light.  Cardiovascular:     Rate and Rhythm: Normal rate and regular rhythm.     Heart sounds: Normal heart sounds.  Pulmonary:     Effort: Pulmonary effort is normal.     Breath sounds: Normal breath sounds.  Abdominal:     General: Bowel sounds are normal.     Palpations: Abdomen is soft.     Comments: Abdominal exam is soft.  She has colostomy intact.  She has a biliary drain catheter also.  There is no obvious fluid wave.  There is no obvious abdominal mass.  Her liver edge might be at the right costal margin.   Musculoskeletal:        General: No tenderness or deformity. Normal range of motion.     Cervical back: Normal range of motion.  Lymphadenopathy:     Cervical: No cervical adenopathy.  Skin:    General: Skin is warm and dry.     Findings: No erythema or rash.  Neurological:     Mental Status: She is alert and oriented to person, place, and time.  Psychiatric:        Behavior: Behavior normal.        Thought Content: Thought content normal.        Judgment: Judgment normal.   Lab Results  Component Value Date   WBC 4.3 11/14/2022   HGB 9.6 (L) 11/14/2022   HCT 31.0 (L) 11/14/2022   MCV 90.9 11/14/2022   PLT 184 11/14/2022   Lab Results  Component Value Date   FERRITIN 312 (H) 09/21/2022   IRON 58 09/21/2022   TIBC 286 09/21/2022   UIBC 228 09/21/2022   IRONPCTSAT 20 09/21/2022   Lab Results  Component Value Date   RETICCTPCT 1.6 08/07/2022   RBC 3.41 (L) 11/14/2022   No results found for: "KPAFRELGTCHN", "LAMBDASER", "KAPLAMBRATIO" No results found for: "IGGSERUM", "IGA", "IGMSERUM" No results found for: "TOTALPROTELP", "ALBUMINELP", "A1GS", "A2GS",  "BETS", "BETA2SER", "GAMS", "MSPIKE", "SPEI"   Chemistry      Component Value Date/Time   NA 138 11/14/2022 0848   K 3.2 (L) 11/14/2022 0848   CL 105 11/14/2022 0848   CO2 23 11/14/2022 0848   BUN 7 11/14/2022 0848   CREATININE 0.49 11/14/2022 0848   CREATININE 0.56 03/23/2021 0000      Component Value Date/Time   CALCIUM 9.3 11/14/2022 0848   ALKPHOS 304 (H) 11/14/2022 0848   AST 21 11/14/2022 0848   ALT 11 11/14/2022 0848   BILITOT 1.1 11/14/2022 0848       Impression and Plan: Ms. Herzog is a very charming  57 yo caucasian female with metastatic colon cancer.  She had disease confined to her liver.  However, she then began to have progression.  She had disease in her L1 vertebral body.  She has received radiation therapy to this.  We had her on Lonsurf and she could not tolerate this at all.  I am just happy that she is feeling better.  I am happy that her blood counts have come up nicely.  I think the main part right now is to get her to have the L1 vertebroplasty.  I think this will make her feel a whole lot better and hopefully help decrease her pain and increase her functional ability.  I have no problems with her holding treatment for couple weeks.  Again this is all about quality of life.  I totally agree that she wants to feel good for a while.  Will have her come back in 2 weeks for treatment.  I am happy that her last CEA went down by 50%.  It went down to the 8.8.     Josph Macho, MD 6/19/20249:32 AM

## 2022-11-14 NOTE — Progress Notes (Signed)
[facility] CSW Progress Note  Visual merchandiser met with patient to assess needs and to provide support.  Her daughter, Darl Pikes, was present and appeared supportive.  CSW offered a bag of food from the pantry and patient accepted.  She displayed a bright affect and engaged in conversation.  No other issues.    Dorothey Baseman, LCSW Clinical Social Worker [facility]    Patient is participating in a Managed Medicaid Plan:  Yes

## 2022-11-14 NOTE — Addendum Note (Signed)
Addended by: Cooper Render on: 11/14/2022 09:50 AM   Modules accepted: Orders

## 2022-11-15 NOTE — Progress Notes (Signed)
Iron product changed to Monoferric x 1 per Dr. Gustavo Lah instructions.

## 2022-11-16 ENCOUNTER — Other Ambulatory Visit (HOSPITAL_BASED_OUTPATIENT_CLINIC_OR_DEPARTMENT_OTHER): Payer: Self-pay

## 2022-11-16 ENCOUNTER — Inpatient Hospital Stay: Payer: Medicaid Other

## 2022-11-19 ENCOUNTER — Other Ambulatory Visit (HOSPITAL_BASED_OUTPATIENT_CLINIC_OR_DEPARTMENT_OTHER): Payer: Self-pay

## 2022-11-19 ENCOUNTER — Encounter: Payer: Self-pay | Admitting: Hematology & Oncology

## 2022-11-19 ENCOUNTER — Other Ambulatory Visit (HOSPITAL_COMMUNITY): Payer: Self-pay

## 2022-11-19 ENCOUNTER — Encounter: Payer: Self-pay | Admitting: Family

## 2022-11-19 ENCOUNTER — Other Ambulatory Visit: Payer: Self-pay | Admitting: Hematology & Oncology

## 2022-11-19 ENCOUNTER — Telehealth: Payer: Self-pay | Admitting: Student

## 2022-11-19 DIAGNOSIS — C801 Malignant (primary) neoplasm, unspecified: Secondary | ICD-10-CM

## 2022-11-19 MED ORDER — HYDROMORPHONE HCL 2 MG PO TABS
6.0000 mg | ORAL_TABLET | ORAL | 0 refills | Status: DC | PRN
  Filled 2022-11-19: qty 120, 4d supply, fill #0

## 2022-11-19 MED ORDER — NORMAL SALINE FLUSH 0.9 % IV SOLN
5.0000 mL | Freq: Every day | INTRAVENOUS | 2 refills | Status: DC
  Filled 2022-11-19: qty 300, 30d supply, fill #0
  Filled 2022-12-27: qty 300, 30d supply, fill #1

## 2022-11-19 NOTE — Telephone Encounter (Signed)
Patient contacted regarding a prescription for saline flushes. Order for saline flushes sent to [facility]. Patient also informed that schedulers will be reaching out to her to schedule routine exchange of her biliary drain. Patient stated her understanding. Kennieth Francois, PA-C 11/19/2022 8:55 AM

## 2022-11-20 ENCOUNTER — Telehealth: Payer: Self-pay | Admitting: Dietician

## 2022-11-20 ENCOUNTER — Inpatient Hospital Stay: Payer: Medicaid Other

## 2022-11-20 ENCOUNTER — Encounter: Payer: Medicaid Other | Admitting: Dietician

## 2022-11-20 VITALS — BP 111/64 | HR 86 | Temp 97.9°F | Resp 17

## 2022-11-20 DIAGNOSIS — D509 Iron deficiency anemia, unspecified: Secondary | ICD-10-CM

## 2022-11-20 DIAGNOSIS — Z5112 Encounter for antineoplastic immunotherapy: Secondary | ICD-10-CM | POA: Diagnosis not present

## 2022-11-20 DIAGNOSIS — D649 Anemia, unspecified: Secondary | ICD-10-CM

## 2022-11-20 MED ORDER — HYDROMORPHONE HCL 1 MG/ML IJ SOLN
1.0000 mg | Freq: Once | INTRAMUSCULAR | Status: AC
  Administered 2022-11-20: 1 mg via INTRAVENOUS
  Filled 2022-11-20: qty 1

## 2022-11-20 MED ORDER — SODIUM CHLORIDE 0.9 % IV SOLN: Freq: Once | INTRAVENOUS | Status: AC

## 2022-11-20 MED ORDER — SODIUM CHLORIDE 0.9 % IV SOLN
1000.0000 mg | Freq: Once | INTRAVENOUS | Status: AC
  Administered 2022-11-20: 1000 mg via INTRAVENOUS
  Filled 2022-11-20: qty 10

## 2022-11-20 MED ORDER — HEPARIN SOD (PORK) LOCK FLUSH 100 UNIT/ML IV SOLN
500.0000 [IU] | Freq: Once | INTRAVENOUS | Status: AC
  Administered 2022-11-20: 500 [IU] via INTRAVENOUS

## 2022-11-20 MED ORDER — SODIUM CHLORIDE 0.9% FLUSH
10.0000 mL | Freq: Once | INTRAVENOUS | Status: AC
  Administered 2022-11-20: 10 mL via INTRAVENOUS

## 2022-11-20 NOTE — Progress Notes (Signed)
Pt declined to stay for post infusion observation period. Pt stated she has tolerated medication multiple times prior without difficulty. Pt aware to call clinic with any questions or concerns. Pt verbalized understanding and had no further questions.  ? ?

## 2022-11-20 NOTE — Telephone Encounter (Signed)
Attempted to reach patient for a scheduled remote nutrition consult. Patient was just leaving infusion center and asked if she could return call for in a bit for her follow up nutrition consult.  Gennaro Africa, RDN, LDN Registered Dietitian, [facility] Part Time Remote (Usual office hours: Tuesday-Thursday) Cell: [phone]

## 2022-11-20 NOTE — Patient Instructions (Signed)

## 2022-11-20 NOTE — Progress Notes (Signed)
Patient complains of right sided back pain radiating to the right hip. Patient states she took 2 mg Dilaudid at home this morning at approximately 0830. Dr. Myna Hidalgo notified. Order given and carried out for 1 mg Dilaudid IVP. Patient verbalized understanding.

## 2022-11-22 ENCOUNTER — Other Ambulatory Visit (HOSPITAL_COMMUNITY): Payer: Self-pay

## 2022-11-26 ENCOUNTER — Other Ambulatory Visit: Payer: Self-pay

## 2022-11-26 ENCOUNTER — Telehealth: Payer: Self-pay | Admitting: *Deleted

## 2022-11-26 ENCOUNTER — Inpatient Hospital Stay: Payer: Medicaid Other | Attending: Hematology & Oncology

## 2022-11-26 ENCOUNTER — Other Ambulatory Visit: Payer: Self-pay | Admitting: Hematology & Oncology

## 2022-11-26 ENCOUNTER — Other Ambulatory Visit: Payer: Self-pay | Admitting: *Deleted

## 2022-11-26 ENCOUNTER — Other Ambulatory Visit (HOSPITAL_BASED_OUTPATIENT_CLINIC_OR_DEPARTMENT_OTHER): Payer: Self-pay

## 2022-11-26 VITALS — BP 95/47 | HR 79 | Temp 97.8°F | Resp 20

## 2022-11-26 DIAGNOSIS — Z923 Personal history of irradiation: Secondary | ICD-10-CM | POA: Diagnosis not present

## 2022-11-26 DIAGNOSIS — Z79899 Other long term (current) drug therapy: Secondary | ICD-10-CM | POA: Diagnosis not present

## 2022-11-26 DIAGNOSIS — R197 Diarrhea, unspecified: Secondary | ICD-10-CM | POA: Insufficient documentation

## 2022-11-26 DIAGNOSIS — K5900 Constipation, unspecified: Secondary | ICD-10-CM | POA: Diagnosis not present

## 2022-11-26 DIAGNOSIS — M545 Low back pain, unspecified: Secondary | ICD-10-CM | POA: Insufficient documentation

## 2022-11-26 DIAGNOSIS — Z880 Allergy status to penicillin: Secondary | ICD-10-CM | POA: Insufficient documentation

## 2022-11-26 DIAGNOSIS — C19 Malignant neoplasm of rectosigmoid junction: Secondary | ICD-10-CM | POA: Insufficient documentation

## 2022-11-26 DIAGNOSIS — Z7952 Long term (current) use of systemic steroids: Secondary | ICD-10-CM | POA: Diagnosis not present

## 2022-11-26 DIAGNOSIS — C787 Secondary malignant neoplasm of liver and intrahepatic bile duct: Secondary | ICD-10-CM | POA: Diagnosis not present

## 2022-11-26 DIAGNOSIS — C189 Malignant neoplasm of colon, unspecified: Secondary | ICD-10-CM

## 2022-11-26 MED ORDER — SODIUM CHLORIDE 0.9 % IV SOLN: Freq: Once | INTRAVENOUS | Status: AC

## 2022-11-26 MED ORDER — HEPARIN SOD (PORK) LOCK FLUSH 100 UNIT/ML IV SOLN
500.0000 [IU] | Freq: Once | INTRAVENOUS | Status: AC
  Administered 2022-11-26: 500 [IU] via INTRAVENOUS

## 2022-11-26 MED ORDER — HYDROMORPHONE HCL 4 MG/ML IJ SOLN
4.0000 mg | Freq: Once | INTRAMUSCULAR | Status: AC
  Administered 2022-11-26: 4 mg via INTRAVENOUS
  Filled 2022-11-26: qty 1

## 2022-11-26 MED ORDER — KETOROLAC TROMETHAMINE 15 MG/ML IJ SOLN
30.0000 mg | Freq: Once | INTRAMUSCULAR | Status: AC
  Administered 2022-11-26: 30 mg via INTRAVENOUS
  Filled 2022-11-26: qty 2

## 2022-11-26 MED ORDER — SODIUM CHLORIDE 0.9% FLUSH
10.0000 mL | Freq: Once | INTRAVENOUS | Status: AC
  Administered 2022-11-26: 10 mL via INTRAVENOUS

## 2022-11-26 MED ORDER — SODIUM CHLORIDE 0.9 % IV SOLN
20.0000 mg | Freq: Once | INTRAVENOUS | Status: AC
  Administered 2022-11-26: 20 mg via INTRAVENOUS
  Filled 2022-11-26: qty 2

## 2022-11-26 MED ORDER — HYDROMORPHONE HCL 2 MG PO TABS
6.0000 mg | ORAL_TABLET | ORAL | 0 refills | Status: DC | PRN
  Filled 2022-11-26: qty 120, 4d supply, fill #0

## 2022-11-26 NOTE — Telephone Encounter (Signed)
Received a call from Patients daughter Darl Pikes reporting that patients pain level is much worse.  The pain is coming from her lower back.  Patient currently taking Dilaudid 6 mg every 2 hours.  Dr Myna Hidalgo notified.  Dr Myna Hidalgo states patient can come in here today tomorrow or wed for fluids, IV pain meds and steroids.  Can increase Dilaudid to 8 mg every 2 hours if needed but would like to have patient have the kyphoplasty procedure. However evaluation is currently scheduled for 7.29.  Dr Myna Hidalgo will work on moving this appt up.  Darl Pikes will let us know if patient wants to come in for medication and fluids.  Darl Pikes understands instructions.

## 2022-11-26 NOTE — Patient Instructions (Signed)

## 2022-11-27 ENCOUNTER — Other Ambulatory Visit (HOSPITAL_COMMUNITY): Payer: Self-pay | Admitting: Neuroradiology

## 2022-11-27 ENCOUNTER — Other Ambulatory Visit (HOSPITAL_BASED_OUTPATIENT_CLINIC_OR_DEPARTMENT_OTHER): Payer: Self-pay

## 2022-11-27 ENCOUNTER — Ambulatory Visit (HOSPITAL_COMMUNITY)
Admission: RE | Admit: 2022-11-27 | Discharge: 2022-11-27 | Disposition: A | Discharge status: Home / Self Care | Payer: Medicaid Other | Source: Ambulatory Visit | Attending: Neuroradiology | Admitting: Neuroradiology

## 2022-11-27 DIAGNOSIS — S32010A Wedge compression fracture of first lumbar vertebra, initial encounter for closed fracture: Secondary | ICD-10-CM

## 2022-11-27 DIAGNOSIS — M899 Disorder of bone, unspecified: Secondary | ICD-10-CM

## 2022-11-27 NOTE — Consult Note (Signed)
Chief Complaint: Patient was seen in consultation today for L1 pathologic fracture.  Referring Physician(s): Arlan Organ  Supervising Physician: Baldemar Lenis  Patient Status: [facility] - Out-pt  History of Present Illness: Rebecca Bullock is a 57 year old female with past medical history significant for stage IV colon cancer.  She underwent laparoscopic loop ileostomy on 08/21/2022,  left hepatic biliary drain on 10/03/2022 for jaundice and bile duct obstruction.  She was readmitted after last chemo treatment on 10/31/2022 for anemia, fever, chills, shortness of breath, tachycardia and hypotension and was found to be neutropenic with sepsis.  She has severe, intractable back pain related to pathologic L1 fracture that she rates 9/10.  This prevents her from performing activities of her daily life and has not responded to conservative management/pain medications.  She comes to our service today to discuss radiofrequency ablation followed by balloon kyphoplasty.   Past Medical History:  Diagnosis Date   Colon cancer metastasized to liver (HCC) 05/05/2021   Family history of lung cancer    Family history of pancreatic cancer    Goals of care, counseling/discussion 05/05/2021   History of radiation therapy    Lumbar Spine- 07/12/22-07/25/22- Dr. Antony Blackbird    Past Surgical History:  Procedure Laterality Date   IR 3D INDEPENDENT WKST  03/15/2022   IR ANGIOGRAM SELECTIVE EACH ADDITIONAL VESSEL  03/15/2022   IR ANGIOGRAM SELECTIVE EACH ADDITIONAL VESSEL  03/15/2022   IR ANGIOGRAM SELECTIVE EACH ADDITIONAL VESSEL  03/15/2022   IR ANGIOGRAM SELECTIVE EACH ADDITIONAL VESSEL  03/29/2022   IR ANGIOGRAM SELECTIVE EACH ADDITIONAL VESSEL  03/29/2022   IR ANGIOGRAM VISCERAL SELECTIVE  03/15/2022   IR ANGIOGRAM VISCERAL SELECTIVE  03/29/2022   IR EMBO ARTERIAL NOT HEMORR HEMANG INC GUIDE ROADMAPPING  03/15/2022   IR EMBO TUMOR ORGAN ISCHEMIA INFARCT INC GUIDE ROADMAPPING  03/29/2022   IR  EXCHANGE BILIARY DRAIN  11/05/2022   IR IMAGING GUIDED PORT INSERTION  05/11/2021   IR INT EXT BILIARY DRAIN WITH CHOLANGIOGRAM  10/03/2022   IR RADIOLOGIST EVAL & MGMT  02/15/2022   IR RADIOLOGIST EVAL & MGMT  04/23/2022   IR RADIOLOGIST EVAL & MGMT  06/25/2022   IR US GUIDE VASC ACCESS RIGHT  03/29/2022   LAPAROSCOPY N/A 08/21/2022   Procedure: LAPAROSCOPIC LOOP ILEOSTOMY;  Surgeon: Emelia Loron, MD;  Location: [facility] ORS;  Service: General;  Laterality: N/A;   uterine ablation      Allergies: Augmentin [amoxicillin-pot clavulanate], Irinotecan, Metronidazole, Vancomycin, Latex, Tape, and Wound dressing adhesive  Medications: Prior to Admission medications   Medication Sig Start Date End Date Taking? Authorizing Provider  alum & mag hydroxide-simeth suspension-nystatin suspension-diphenhydrAMINE liquid Take 5 mLs by mouth 4 (four) times daily as needed. 11/12/22   Josph Macho, MD  baclofen (LIORESAL) 10 MG tablet Take 1 tablet (10 mg total) by mouth 3 (three) times daily. Patient taking differently: Take 10 mg by mouth as needed for muscle spasms. 08/28/22   Josph Macho, MD  dexamethasone (DECADRON) 4 MG tablet Take 2 tablets (8 mg total) by mouth daily. Start the day after chemotherapy for 2 days. Take with food. 10/17/22   Josph Macho, MD  famotidine (PEPCID) 40 MG tablet Take 1 tablet (40 mg total) by mouth 2 (two) times daily. 08/07/22   Josph Macho, MD  HYDROmorphone (DILAUDID) 2 MG tablet Take 3 tablets (6 mg total) by mouth every 2 (two) hours as needed. 11/26/22   Josph Macho, MD  LORazepam (  ATIVAN) 0.5 MG tablet Place 1 tablet (0.5 mg total) under the tongue every 4 (four) hours as needed for anxiety. 09/21/22   Josph Macho, MD  megestrol (MEGACE) 400 MG/10ML suspension Take 10 mLs (400 mg total) by mouth 2 (two) times daily. 09/21/22   Josph Macho, MD  meloxicam (MOBIC) 7.5 MG tablet Take 1 tablet (7.5 mg total) by mouth 2 (two) times daily. 11/01/22    Pickenpack-Cousar, Arty Baumgartner, NP  midodrine (PROAMATINE) 2.5 MG tablet Take 1 tablet (2.5 mg total) by mouth 3 (three) times daily with meals. 11/08/22   Marinda Elk, MD  morphine (MS CONTIN) 15 MG 12 hr tablet Take 1 tablet (15 mg total) by mouth every 8 (eight) hours. 11/08/22   Josph Macho, MD  morphine (MS CONTIN) 30 MG 12 hr tablet Take 1 tablet (30 mg total) by mouth every 8 (eight) hours. 11/08/22   Josph Macho, MD  OLANZapine (ZYPREXA) 10 MG tablet Take 1 tablet (10 mg total) by mouth at bedtime. 09/21/22   Josph Macho, MD  ondansetron (ZOFRAN) 4 MG tablet Take 1 tablet (4 mg total) by mouth every 6 (six) hours. 10/08/22   Hollice Espy, MD  scopolamine (TRANSDERM-SCOP) 1 MG/3DAYS Place one patch behind the ear every three days. 09/10/22   Josph Macho, MD  Sodium Chloride Flush (NORMAL SALINE FLUSH) 0.9 % SOLN Flush abdominal drain with 5 ml sterile saline once daily 11/19/22   Kennieth Francois, PA  Tbo-Filgrastim [facility]) 480 MCG/0.8ML SOSY injection Inject 0.8 mLs (480 mcg total) into the skin daily at 6 PM. 11/08/22   Marinda Elk, MD     Family History  Problem Relation Age of Onset   Lung cancer Mother    Diabetes Maternal Grandmother    Pancreatic cancer Maternal Grandfather    Multiple sclerosis Maternal Aunt    Alcohol abuse Maternal Uncle     Social History   Socioeconomic History   Marital status: Single    Spouse name: Not on file   Number of children: Not on file   Years of education: Not on file   Highest education level: Not on file  Occupational History   Not on file  Tobacco Use   Smoking status: Never   Smokeless tobacco: Never  Vaping Use   Vaping Use: Never used  Substance and Sexual Activity   Alcohol use: Not Currently    Comment: occasionally   Drug use: Not Currently   Sexual activity: Not Currently  Other Topics Concern   Not on file  Social History Narrative   Not on file   Social Determinants of Health    Financial Resource Strain: Medium Risk (05/18/2021)   Overall Financial Resource Strain (CARDIA)    Difficulty of Paying Living Expenses: Somewhat hard  Food Insecurity: No Food Insecurity (11/03/2022)   Hunger Vital Sign    Worried About Running Out of Food in the Last Year: Never true    Ran Out of Food in the Last Year: Never true  Transportation Needs: No Transportation Needs (11/03/2022)   PRAPARE - Administrator, Civil Service (Medical): No    Lack of Transportation (Non-Medical): No  Physical Activity: Not on file  Stress: Not on file  Social Connections: Not on file     Review of Systems: A 12 point ROS discussed and pertinent positives are indicated in the HPI above.  All other systems are negative.  Review of Systems  Vital Signs: There were no vitals taken for this visit.  Physical Exam Pulmonary:     Effort: Pulmonary effort is normal.  Musculoskeletal:       Back:  Neurological:     General: No focal deficit present.     Mental Status: She is alert and oriented to person, place, and time.          Imaging: IR EXCHANGE BILIARY DRAIN  Result Date: 11/05/2022 INDICATION: History of metastatic colorectal carcinoma to the liver causing biliary obstruction and status post placement of percutaneous internal/external biliary drainage catheter via left lobe approach on 10/03/2022. Patient has been admitted with biliary sepsis and neutropenia and requires exchange of the biliary drain. EXAM: EXCHANGE OF PERCUTANEOUS INTERNAL/EXTERNAL BILIARY DRAINAGE CATHETER UNDER FLUOROSCOPY INCLUDING CHOLANGIOGRAM MEDICATIONS: None ANESTHESIA/SEDATION: None FLUOROSCOPY: Radiation Exposure Index (as provided by the fluoroscopic device): 79 mGy Kerma CONTRAST:  10 mL Omnipaque 300 COMPLICATIONS: None immediate. PROCEDURE: Informed written consent was obtained from the patient after a thorough discussion of the procedural risks, benefits and alternatives. All questions were  addressed. Maximal Sterile Barrier Technique was utilized including caps, mask, sterile gowns, sterile gloves, sterile drape, hand hygiene and skin antiseptic. A timeout was performed prior to the initiation of the procedure. A cholangiogram was performed through the pre-existing biliary drainage catheter and a fluoroscopic spot image obtained. The catheter was then removed over a guidewire after cutting the retention suture. A new 10 French multi side-hole internal/external biliary drainage catheter was then advanced over the wire. The distal pigtail portion of the catheter was formed in the duodenal lumen. The catheter was injected with contrast material to confirm position, flushed with sterile saline and attached to a new gravity drainage bag. External catheter exit site was secured with a Prolene retention suture and adhesive retention device. FINDINGS: Initial cholangiogram demonstrates a patent biliary drainage catheter and decompressed intrahepatic and extrahepatic bile ducts with no evidence of biliary obstruction or filling defects. A new drainage catheter was placed with the distal portion formed in the duodenum and sideholes extending up into left intrahepatic ducts. IMPRESSION: Successful exchange of 10 French percutaneous internal/external biliary drainage catheter via left lobe approach under fluoroscopy. Electronically Signed   By: Irish Lack M.D.   On: 11/05/2022 15:33   CT ABDOMEN PELVIS WO CONTRAST  Result Date: 11/02/2022 CLINICAL DATA:  Sepsis EXAM: CT ABDOMEN AND PELVIS WITHOUT CONTRAST TECHNIQUE: Multidetector CT imaging of the abdomen and pelvis was performed following the standard protocol without IV contrast. RADIATION DOSE REDUCTION: This exam was performed according to the departmental dose-optimization program which includes automated exposure control, adjustment of the mA and/or kV according to patient size and/or use of iterative reconstruction technique. COMPARISON:  CT  abdomen and pelvis 10/03/2022 FINDINGS: Lower chest: No acute abnormality. Indeterminate nodules measuring up to 3 mm are similar to prior. Hepatobiliary: Large lobulated heterogenously hypoattenuating mass in the right hepatic dome is unchanged in size and measures approximately 11.4 cm. Unchanged hepatic cyst in the inferior right hepatic lobe. New percutaneous biliary drainage catheter. Decreased dilation of the intrahepatic bile ducts. Cholelithiasis with similar gallbladder wall thickening. Similar soft tissue in the porta hepatis which could represent lymphadenopathy or tumor. Pancreas: Unremarkable. Spleen: The spleen is mildly enlarged measuring 13.6 cm in craniocaudal dimension. Adrenals/Urinary Tract: Stable adrenal glands. No urinary calculi or hydronephrosis. Contrast within the bladder. Stomach/Bowel: Normal caliber large and small bowel. Bowel wall thickening of the cecum and right colon compatible with known primary colon cancer. Loop ileostomy in  the right anterior abdomen. Vascular/Lymphatic: Unchanged porta hepatis lymphadenopathy measuring up to 19 mm in short axis on series 2/image 24). 9 mm ileocolic node is unchanged. No acute vascular abnormality Reproductive: Uterus and bilateral adnexa are unremarkable. Other: Small volume free fluid in the pelvis. No free intraperitoneal air. Musculoskeletal: Sclerotic lesion in the L1 vertebral body with superior endplate compression fracture is unchanged. No acute fracture. IMPRESSION: 1. New percutaneous biliary drainage catheter with decreased dilation of the intrahepatic bile ducts. 2. Unchanged size of the large hepatic dome metastasis. Similar porta hepatis lymphadenopathy. 3. Unchanged wall thickening of the cecum and right colon compatible with known primary colon cancer. 4. Cholelithiasis and gallbladder wall thickening similar to prior 5. Unchanged sclerotic lesion in the L1 vertebral body with pathologic superior endplate compression fracture.  6. Small volume free fluid in the pelvis. 7. Mild splenomegaly. 8. Indeterminate pulmonary nodules measuring up to 3 mm are similar to prior. Electronically Signed   By: Minerva Fester M.D.   On: 11/02/2022 20:03   CT Angio Chest PE W/Cm &/Or Wo Cm  Result Date: 11/02/2022 CLINICAL DATA:  History of metastatic colon cancer, presenting with increased drowsiness. EXAM: CT ANGIOGRAPHY CHEST WITH CONTRAST TECHNIQUE: Multidetector CT imaging of the chest was performed using the standard protocol during bolus administration of intravenous contrast. Multiplanar CT image reconstructions and MIPs were obtained to evaluate the vascular anatomy. RADIATION DOSE REDUCTION: This exam was performed according to the departmental dose-optimization program which includes automated exposure control, adjustment of the mA and/or kV according to patient size and/or use of iterative reconstruction technique. CONTRAST:  OMNIPAQUE IOHEXOL 350 MG/ML SOLN COMPARISON:  Chest CT with contrast, dated April 05, 2021 and abdomen and pelvis CT dated Oct 03, 2022 FINDINGS: Cardiovascular: A right-sided venous Port-A-Cath is in place. The thoracic aorta is normal in appearance. Satisfactory opacification of the pulmonary arteries to the segmental level. No evidence of pulmonary embolism. Normal heart size. No pericardial effusion. Mediastinum/Nodes: No enlarged mediastinal, hilar, or axillary lymph nodes. A 7 mm thyroid nodule is seen within the lower pole of the left lobe of the thyroid gland. The trachea and esophagus demonstrate no significant findings. Lungs/Pleura: Mild lingular and posterior right basilar atelectasis is seen. There is no evidence of acute infiltrate, pleural effusion or pneumothorax. Upper Abdomen: There is diffuse fatty infiltration of the liver parenchyma. An 11.2 cm x 10.7 cm x 11.2 cm area of heterogeneous low attenuation is seen occupying the majority of the posterior aspect of the right lobe of the liver. A  stable 1.6 cm x 1.7 cm x 1.8 cm area of parenchymal low attenuation is seen within the anterolateral aspect of an enlarged spleen. A percutaneous biliary drainage catheter is in place. A 1.6 cm gallstone is seen within a contracted gallbladder. Musculoskeletal: The L1 vertebral body is sclerotic and heterogeneous in appearance. This is seen on the prior abdomen and pelvis CT. Review of the MIP images confirms the above findings. IMPRESSION: 1. No evidence of pulmonary embolism. 2. Mild lingular and posterior right basilar atelectasis. 3. Large hepatic mass, consistent with metastatic disease. 4. Cholelithiasis. 5. Percutaneous biliary drainage catheter in place. 6. Findings consistent with osseous metastatic disease involving the L1 vertebral body. 7. 7 mm left thyroid nodule. No follow-up imaging is recommended. This follows ACR consensus guidelines: Managing Incidental Thyroid Nodules Detected on Imaging: White Paper of the ACR Incidental Thyroid Findings Committee. J Am Coll Radiol 2015; 12:143-150. Electronically Signed   By: Demetrius Revel.D.  On: 11/02/2022 18:41   DG Chest 2 View  Result Date: 11/02/2022 CLINICAL DATA:  Shortness of breath. History of metastatic colon cancer. EXAM: CHEST - 2 VIEW COMPARISON:  08/18/2022 FINDINGS: No consolidation, pneumothorax or effusion. No edema. Normal cardiopericardial silhouette. Degenerative changes of the spine on lateral view. Stable right upper chest port with tip along the central SVC above the right atrium. Separate catheter in the upper abdomen. IMPRESSION: No acute cardiopulmonary disease.  Chest port Electronically Signed   By: Karen Kays M.D.   On: 11/02/2022 14:24    Labs:  CBC: Recent Labs    11/07/22 0320 11/08/22 0229 11/09/22 0949 11/14/22 0848  WBC 0.7* 0.8* 1.3* 4.3  HGB 7.6* 8.0* 9.5* 9.6*  HCT 23.5* 25.1* 29.9* 31.0*  PLT 47* 51* 84* 184    COAGS: Recent Labs    03/15/22 0801 03/29/22 0820 10/02/22 1402 11/02/22 1438   INR 1.0 1.0 1.2 1.5*  APTT  --   --   --  29    BMP: Recent Labs    11/07/22 0320 11/08/22 0229 11/09/22 0949 11/14/22 0848  NA 134* 134* 137 138  K 2.6* 4.1 3.7 3.2*  CL 104 105 103 105  CO2 23 23 25 23   GLUCOSE 102* 105* 128* 135*  BUN <5* <5* <5* 7  CALCIUM 7.5* 7.8* 9.4 9.3  CREATININE 0.38* 0.42* 0.51 0.49  GFRNONAA >60 >60 >60 >60    LIVER FUNCTION TESTS: Recent Labs    11/07/22 0320 11/08/22 0229 11/09/22 0949 11/14/22 0848  BILITOT 1.2 1.3* 1.6* 1.1  AST 15 15 16 21   ALT 13 13 11 11   ALKPHOS 152* 143* 201* 304*  PROT 5.5* 6.1* 6.9 7.0  ALBUMIN 2.2* 2.2* 3.5 3.5    TUMOR MARKERS: No results for input(s): "AFPTM", "CEA", "CA199", "CHROMGRNA" in the last 8760 hours.  Assessment and Plan:  Mrs. Chappell has had intractable back pain for several months, worsened in the last month with 9/10 on pain scale.  She has not head relief with conservative management.  I believe she could greatly benefit from radiofrequency ablation followed by kyphoplasty for treatment of back pain related to metastatic lesion.  I explained the risks and benefits of the procedure to her and her 2 children.  She would like to proceed with spine intervention under moderate sedation.  All questions were answered to her satisfaction.    Thank you for this referral.  I greatly enjoyed meeting Jamileth Bullock and look forward to participating in her care.  A copy of this report was sent to the requesting provider on this date.  Electronically Signed: Baldemar Lenis, MD 11/27/2022, 2:42 PM   I spent a total of  40 Minutes   in face to face in clinical consultation, greater than 50% of which was counseling/coordinating care for L1 pathologic fracture.

## 2022-11-28 ENCOUNTER — Inpatient Hospital Stay: Payer: Medicaid Other

## 2022-11-28 ENCOUNTER — Other Ambulatory Visit: Payer: Self-pay

## 2022-11-28 ENCOUNTER — Inpatient Hospital Stay (HOSPITAL_BASED_OUTPATIENT_CLINIC_OR_DEPARTMENT_OTHER): Payer: Medicaid Other | Admitting: Hematology & Oncology

## 2022-11-28 ENCOUNTER — Encounter: Payer: Self-pay | Admitting: Hematology & Oncology

## 2022-11-28 VITALS — BP 96/47 | HR 59 | Resp 17

## 2022-11-28 VITALS — BP 107/90 | HR 86 | Temp 97.7°F | Resp 18 | Ht 61.0 in | Wt 153.0 lb

## 2022-11-28 DIAGNOSIS — C19 Malignant neoplasm of rectosigmoid junction: Secondary | ICD-10-CM | POA: Diagnosis not present

## 2022-11-28 DIAGNOSIS — C189 Malignant neoplasm of colon, unspecified: Secondary | ICD-10-CM | POA: Diagnosis not present

## 2022-11-28 DIAGNOSIS — C787 Secondary malignant neoplasm of liver and intrahepatic bile duct: Secondary | ICD-10-CM | POA: Diagnosis not present

## 2022-11-28 LAB — CMP (CANCER CENTER ONLY)
ALT: 21 U/L (ref 0–44)
AST: 32 U/L (ref 15–41)
Albumin: 3.6 g/dL (ref 3.5–5.0)
Alkaline Phosphatase: 360 U/L — ABNORMAL HIGH (ref 38–126)
Anion gap: 9 (ref 5–15)
BUN: 12 mg/dL (ref 6–20)
CO2: 21 mmol/L — ABNORMAL LOW (ref 22–32)
Calcium: 9.1 mg/dL (ref 8.9–10.3)
Chloride: 109 mmol/L (ref 98–111)
Creatinine: 0.55 mg/dL (ref 0.44–1.00)
GFR, Estimated: >60 mL/min (ref >60)
Glucose, Bld: 135 mg/dL — ABNORMAL HIGH (ref 70–99)
Potassium: 3.6 mmol/L (ref 3.5–5.1)
Sodium: 139 mmol/L (ref 135–145)
Total Bilirubin: 0.9 mg/dL (ref 0.3–1.2)
Total Protein: 7.7 g/dL (ref 6.5–8.1)

## 2022-11-28 LAB — CBC WITH DIFFERENTIAL (CANCER CENTER ONLY)
Abs Immature Granulocytes: 0.14 10*3/uL — ABNORMAL HIGH (ref 0.00–0.07)
Basophils Absolute: 0 10*3/uL (ref 0.0–0.1)
Basophils Relative: 1 %
Eosinophils Absolute: 0 10*3/uL (ref 0.0–0.5)
Eosinophils Relative: 1 %
HCT: 31 % — ABNORMAL LOW (ref 36.0–46.0)
Hemoglobin: 9.4 g/dL — ABNORMAL LOW (ref 12.0–15.0)
Immature Granulocytes: 2 %
Lymphocytes Relative: 13 %
Lymphs Abs: 1.1 10*3/uL (ref 0.7–4.0)
MCH: 28.1 pg (ref 26.0–34.0)
MCHC: 30.3 g/dL (ref 30.0–36.0)
MCV: 92.8 fL (ref 80.0–100.0)
Monocytes Absolute: 0.8 10*3/uL (ref 0.1–1.0)
Monocytes Relative: 10 %
Neutro Abs: 6.1 10*3/uL (ref 1.7–7.7)
Neutrophils Relative %: 73 %
Platelet Count: 308 10*3/uL (ref 150–400)
RBC: 3.34 MIL/uL — ABNORMAL LOW (ref 3.87–5.11)
RDW: 19.4 % — ABNORMAL HIGH (ref 11.5–15.5)
WBC Count: 8.2 10*3/uL (ref 4.0–10.5)
nRBC: 0 % (ref 0.0–0.2)

## 2022-11-28 LAB — MAGNESIUM: Magnesium: 2.1 mg/dL (ref 1.7–2.4)

## 2022-11-28 LAB — CEA (IN HOUSE-CHCC): CEA (CHCC-In House): 13.13 ng/mL — ABNORMAL HIGH (ref 0.00–5.00)

## 2022-11-28 MED ORDER — HYDROMORPHONE HCL 4 MG/ML IJ SOLN
4.0000 mg | Freq: Once | INTRAMUSCULAR | Status: AC
  Administered 2022-11-28: 4 mg via INTRAVENOUS
  Filled 2022-11-28: qty 1

## 2022-11-28 MED ORDER — SODIUM CHLORIDE 0.9% FLUSH
10.0000 mL | Freq: Once | INTRAVENOUS | Status: AC
  Administered 2022-11-28: 10 mL via INTRAVENOUS

## 2022-11-28 MED ORDER — SODIUM CHLORIDE 0.9 % IV SOLN
20.0000 mg | Freq: Once | INTRAVENOUS | Status: AC
  Administered 2022-11-28: 20 mg via INTRAVENOUS
  Filled 2022-11-28: qty 20

## 2022-11-28 MED ORDER — SODIUM CHLORIDE 0.9 % IV SOLN: Freq: Once | INTRAVENOUS | Status: AC

## 2022-11-28 MED ORDER — HEPARIN SOD (PORK) LOCK FLUSH 100 UNIT/ML IV SOLN
500.0000 [IU] | Freq: Once | INTRAVENOUS | Status: AC
  Administered 2022-11-28: 500 [IU] via INTRAVENOUS

## 2022-11-28 MED ORDER — KETOROLAC TROMETHAMINE 15 MG/ML IJ SOLN
30.0000 mg | Freq: Once | INTRAMUSCULAR | Status: AC
  Administered 2022-11-28: 30 mg via INTRAVENOUS
  Filled 2022-11-28: qty 2

## 2022-11-28 NOTE — Progress Notes (Signed)
Hematology and Oncology Follow Up Visit  Rebecca Bullock [id number] 1965/06/08 57 y.o. 11/28/2022   Principle Diagnosis:  Metastatic colorectal cancer-liver metastasis- BRAF (+)   Current Therapy:        Status post cycle 1 of chemotherapy with FOLFOXIRI/Avastin Encorafenib/Vectibix -- started on 06/19/2021, s/p cycle #6 -- d/c on 10/31/2021 Intrahepatic TACE -- 03/29/2022 FOLFOXIRI/Avastin-- s/p cycle  #5-- start on 02/19/2022, -oxaliplatin dropped on 04/03/2022 -- d/c on 07/03/2022 IV iron-Ferrlecit given on 05/10/2022  Lonsurf/Avastin -- s/p cycle #1 -- start on 07/07/2022 -DC on 08/15/2022 Zometa 4 mg IV every 3 months -next dose on 11/2022  XRT to L1 vertebral body --start on 07/10/2022 Fruquintinib 5 mg po q day (21/7) --  start on 04/`09/2022 --changed to 3 mg p.o. daily (21/7) on 09/25/2022 FOLFIRI/Avastin -- s/p cycle #2 -- start on 10/17/2022               Interim History:  Rebecca Bullock is here today for follow-up.  She has been having more in the way of lower back pain.  I have to believe this is from this compression fracture at L1.  She did see IR yesterday.  They will try to set up for a kyphoplasty.  Hopefully this will help with her pain.  We will hold off on her chemotherapy for right now.  I want to make sure that her blood counts are going to be okay so that she can have the procedure.  She is about the same overall.  She has had a decent appetite.  She has had no nausea or vomiting.  Again, pain has been the biggest problem.  Her last CEA was 9.3 which is really holding steady.  She has a colostomy which is functioning well.  She has a biliary drainage catheter which seems to be working.  Her bilirubin has been normal.  It would be nice if she could possibly even have this exchanged or removed.  Currently, I would have said that her performance status is probably ECOG 1.     Medications:  Allergies as of 11/28/2022       Reactions   Augmentin [amoxicillin-pot Clavulanate]  Diarrhea, Other (See Comments)   Extreme diarrhea, abdominal pain.   Irinotecan Other (See Comments)   Flushing, tingling, visual disturbance   Metronidazole Diarrhea, Other (See Comments)   Vancomycin Rash, Other (See Comments)   "Red Man Syndrome"   Latex Rash   Tape Rash   Wound Dressing Adhesive Rash        Medication List        Accurate as of November 28, 2022  1:47 PM. If you have any questions, ask your nurse or doctor.          alum & mag hydroxide-simeth suspension-nystatin suspension-diphenhydrAMINE liquid Take 5 mLs by mouth 4 (four) times daily as needed.   baclofen 10 MG tablet Commonly known as: LIORESAL Take 1 tablet (10 mg total) by mouth 3 (three) times daily. What changed:  when to take this reasons to take this   dexamethasone 4 MG tablet Commonly known as: DECADRON Take 2 tablets (8 mg total) by mouth daily. Start the day after chemotherapy for 2 days. Take with food.   famotidine 40 MG tablet Commonly known as: PEPCID Take 1 tablet (40 mg total) by mouth 2 (two) times daily.   HYDROmorphone 2 MG tablet Commonly known as: Dilaudid Take 3 tablets (6 mg total) by mouth every 2 (two) hours as needed.   LORazepam  0.5 MG tablet Commonly known as: ATIVAN Place 1 tablet (0.5 mg total) under the tongue every 4 (four) hours as needed for anxiety.   megestrol 40 MG/ML suspension Commonly known as: MEGACE Take 10 mLs (400 mg total) by mouth 2 (two) times daily.   meloxicam 7.5 MG tablet Commonly known as: Mobic Take 1 tablet (7.5 mg total) by mouth 2 (two) times daily.   midodrine 2.5 MG tablet Commonly known as: PROAMATINE Take 1 tablet (2.5 mg total) by mouth 3 (three) times daily with meals.   morphine 30 MG 12 hr tablet Commonly known as: MS CONTIN Take 1 tablet (30 mg total) by mouth every 8 (eight) hours.   morphine 15 MG 12 hr tablet Commonly known as: MS CONTIN Take 1 tablet (15 mg total) by mouth every 8 (eight) hours.   Normal  Saline Flush 0.9 % Soln Flush abdominal drain with 5 ml sterile saline once daily   OLANZapine 10 MG tablet Commonly known as: ZYPREXA Take 1 tablet (10 mg total) by mouth at bedtime.   ondansetron 4 MG tablet Commonly known as: ZOFRAN Take 1 tablet (4 mg total) by mouth every 6 (six) hours.   Tbo-Filgrastim 480 MCG/0.8ML Sosy injection Commonly known as: GRANIX Inject 0.8 mLs (480 mcg total) into the skin daily at 6 PM.   Transderm-Scop 1 MG/3DAYS Generic drug: scopolamine Place one patch behind the ear every three days.        Allergies:  Allergies  Allergen Reactions   Augmentin [Amoxicillin-Pot Clavulanate] Diarrhea and Other (See Comments)    Extreme diarrhea, abdominal pain.   Irinotecan Other (See Comments)    Flushing, tingling, visual disturbance   Metronidazole Diarrhea and Other (See Comments)   Vancomycin Rash and Other (See Comments)    "Red Man Syndrome"   Latex Rash   Tape Rash   Wound Dressing Adhesive Rash    Past Medical History, Surgical history, Social history, and Family History were reviewed and updated.  Review of Systems: Review of Systems  Constitutional: Negative.   HENT: Negative.    Eyes: Negative.   Respiratory: Negative.    Cardiovascular: Negative.   Gastrointestinal: Negative.        Constipation alternating with diarrhea.   Genitourinary: Negative.   Musculoskeletal: Negative.   Skin: Negative.   Neurological: Negative.   Endo/Heme/Allergies: Negative.   Psychiatric/Behavioral: Negative.       Physical Exam: Temperature is 97.7.  Pulse is 86.  Blood pressure 107/90.  Weight is 153 pounds.   Wt Readings from Last 3 Encounters:  11/28/22 153 lb (69.4 kg)  11/14/22 154 lb (69.9 kg)  11/05/22 164 lb 8 oz (74.6 kg)    Physical Exam Vitals reviewed.  Constitutional:      Comments: This is a well-developed and well-nourished white female.  There is no scleral icterus.    HENT:     Head: Normocephalic and atraumatic.   Eyes:     General: Scleral icterus present.     Pupils: Pupils are equal, round, and reactive to light.  Cardiovascular:     Rate and Rhythm: Normal rate and regular rhythm.     Heart sounds: Normal heart sounds.  Pulmonary:     Effort: Pulmonary effort is normal.     Breath sounds: Normal breath sounds.  Abdominal:     General: Bowel sounds are normal.     Palpations: Abdomen is soft.     Comments: Abdominal exam is soft.  She has colostomy intact.  She has a biliary drain catheter also.  There is no obvious fluid wave.  There is no obvious abdominal mass.  Her liver edge might be at the right costal margin.   Musculoskeletal:        General: No tenderness or deformity. Normal range of motion.     Cervical back: Normal range of motion.  Lymphadenopathy:     Cervical: No cervical adenopathy.  Skin:    General: Skin is warm and dry.     Findings: No erythema or rash.  Neurological:     Mental Status: She is alert and oriented to person, place, and time.  Psychiatric:        Behavior: Behavior normal.        Thought Content: Thought content normal.        Judgment: Judgment normal.    Lab Results  Component Value Date   WBC 8.2 11/28/2022   HGB 9.4 (L) 11/28/2022   HCT 31.0 (L) 11/28/2022   MCV 92.8 11/28/2022   PLT 308 11/28/2022   Lab Results  Component Value Date   FERRITIN 512 (H) 11/14/2022   IRON 48 11/14/2022   TIBC 346 11/14/2022   UIBC 298 11/14/2022   IRONPCTSAT 14 11/14/2022   Lab Results  Component Value Date   RETICCTPCT 1.6 08/07/2022   RBC 3.34 (L) 11/28/2022   No results found for: "KPAFRELGTCHN", "LAMBDASER", "KAPLAMBRATIO" No results found for: "IGGSERUM", "IGA", "IGMSERUM" No results found for: "TOTALPROTELP", "ALBUMINELP", "A1GS", "A2GS", "BETS", "BETA2SER", "GAMS", "MSPIKE", "SPEI"   Chemistry      Component Value Date/Time   NA 139 11/28/2022 0905   K 3.6 11/28/2022 0905   CL 109 11/28/2022 0905   CO2 21 (L) 11/28/2022 0905   BUN 12  11/28/2022 0905   CREATININE 0.55 11/28/2022 0905   CREATININE 0.56 03/23/2021 0000      Component Value Date/Time   CALCIUM 9.1 11/28/2022 0905   ALKPHOS 360 (H) 11/28/2022 0905   AST 32 11/28/2022 0905   ALT 21 11/28/2022 0905   BILITOT 0.9 11/28/2022 0905       Impression and Plan: Ms. Howse is a very charming  57 yo caucasian female with metastatic colon cancer.  She had disease confined to her liver.  However, she then began to have progression.  She had disease in her L1 vertebral body.  She has received radiation therapy to this.  We had her on Lonsurf and she could not tolerate this at all.  She currently is on FOLFIRI/Avastin.  She will she was hospitalized because of severe pancytopenia.  I do not want to give her chemotherapy right now for fear of her not be able to have the kyphoplasty to try to help with her pain.  Hopefully, this kyphoplasty can be done in a week or so.  For right now, as her quality of life that is important.  I really want to make sure that we focus on her quality of life.  We will go ahead and plan to get her back in 3 weeks.  By then, she should have had the procedure.  Hopefully, she will be feeling better.    Josph Macho, MD 7/3/20241:47 PM

## 2022-11-28 NOTE — Patient Instructions (Signed)

## 2022-11-29 ENCOUNTER — Other Ambulatory Visit: Payer: Self-pay | Admitting: Hematology & Oncology

## 2022-11-30 ENCOUNTER — Inpatient Hospital Stay: Payer: Medicaid Other

## 2022-11-30 ENCOUNTER — Other Ambulatory Visit (HOSPITAL_BASED_OUTPATIENT_CLINIC_OR_DEPARTMENT_OTHER): Payer: Self-pay

## 2022-11-30 MED ORDER — HYDROMORPHONE HCL 2 MG PO TABS
6.0000 mg | ORAL_TABLET | ORAL | 0 refills | Status: DC | PRN
  Filled 2022-11-30: qty 120, 4d supply, fill #0

## 2022-12-01 ENCOUNTER — Other Ambulatory Visit: Payer: Self-pay | Admitting: Nurse Practitioner

## 2022-12-01 DIAGNOSIS — C787 Secondary malignant neoplasm of liver and intrahepatic bile duct: Secondary | ICD-10-CM

## 2022-12-01 DIAGNOSIS — G893 Neoplasm related pain (acute) (chronic): Secondary | ICD-10-CM

## 2022-12-01 DIAGNOSIS — Z515 Encounter for palliative care: Secondary | ICD-10-CM

## 2022-12-01 MED ORDER — MELOXICAM 7.5 MG PO TABS
7.5000 mg | ORAL_TABLET | Freq: Two times a day (BID) | ORAL | 0 refills | Status: DC
  Filled 2022-12-01: qty 60, 30d supply, fill #0

## 2022-12-03 ENCOUNTER — Other Ambulatory Visit (HOSPITAL_BASED_OUTPATIENT_CLINIC_OR_DEPARTMENT_OTHER): Payer: Self-pay

## 2022-12-03 ENCOUNTER — Inpatient Hospital Stay: Payer: Medicaid Other | Attending: Hematology & Oncology | Admitting: Nurse Practitioner

## 2022-12-03 ENCOUNTER — Encounter: Payer: Self-pay | Admitting: Nurse Practitioner

## 2022-12-03 DIAGNOSIS — R531 Weakness: Secondary | ICD-10-CM | POA: Diagnosis not present

## 2022-12-03 DIAGNOSIS — C189 Malignant neoplasm of colon, unspecified: Secondary | ICD-10-CM

## 2022-12-03 DIAGNOSIS — Z515 Encounter for palliative care: Secondary | ICD-10-CM | POA: Diagnosis not present

## 2022-12-03 DIAGNOSIS — G893 Neoplasm related pain (acute) (chronic): Secondary | ICD-10-CM

## 2022-12-03 DIAGNOSIS — Z7189 Other specified counseling: Secondary | ICD-10-CM | POA: Diagnosis not present

## 2022-12-03 DIAGNOSIS — C787 Secondary malignant neoplasm of liver and intrahepatic bile duct: Secondary | ICD-10-CM

## 2022-12-03 NOTE — Progress Notes (Unsigned)
Palliative Medicine [facility]  Telephone:([phone]) [phone] Fax:(336) [phone]   Name: Rebecca Bullock Date: 12/03/2022 MRN: [id number]  DOB: 1965/08/04  Patient Care Team: Nolene Ebbs as PCP - General (Family Medicine) Josph Macho, MD as Medical Oncologist (Oncology) Pickenpack-Cousar, Arty Baumgartner, NP as Nurse Practitioner (Nurse Practitioner)   I connected with Rebecca Bullock on 12/03/22 at  2:00 PM EDT by phone and verified that I am speaking with the correct person using two identifiers.   I discussed the limitations, risks, security and privacy concerns of performing an evaluation and management service by telemedicine and the availability of in-person appointments. I also discussed with the patient that there may be a patient responsible charge related to this service. The patient expressed understanding and agreed to proceed.   Other persons participating in the visit and their role in the encounter: n/a   Patient's location: home  Provider's location: [facility]   Chief Complaint: f/u of symptom management   INTERVAL HISTORY: Rebecca Bullock is a 57 y.o. female with oncologic medical history including colon cancer (04/2021) with metastatic disease to liver and bone. Patient underwent a diverting loop ileostomy in march 2024. Palliative ask to see for symptom and pain management and goals of care   SOCIAL HISTORY:     reports that she has never smoked. She has never used smokeless tobacco. She reports that she does not currently use alcohol. She reports that she does not currently use drugs.  ADVANCE DIRECTIVES:  None on file  CODE STATUS: Full code  PAST MEDICAL HISTORY: Past Medical History:  Diagnosis Date   Colon cancer metastasized to liver (HCC) 05/05/2021   Family history of lung cancer    Family history of pancreatic cancer    Goals of care, counseling/discussion 05/05/2021   History of radiation therapy    Lumbar Spine- 07/12/22-07/25/22- Dr.  Antony Blackbird    ALLERGIES:  is allergic to augmentin [amoxicillin-pot clavulanate], irinotecan, metronidazole, vancomycin, latex, tape, and wound dressing adhesive.  MEDICATIONS:  Current Outpatient Medications  Medication Sig Dispense Refill   alum & mag hydroxide-simeth suspension-nystatin suspension-diphenhydrAMINE liquid Take 5 mLs by mouth 4 (four) times daily as needed. 240 mL 0   baclofen (LIORESAL) 10 MG tablet Take 1 tablet (10 mg total) by mouth 3 (three) times daily. (Patient taking differently: Take 10 mg by mouth as needed for muscle spasms.) 60 each 2   dexamethasone (DECADRON) 4 MG tablet Take 2 tablets (8 mg total) by mouth daily. Start the day after chemotherapy for 2 days. Take with food. 8 tablet 5   famotidine (PEPCID) 40 MG tablet Take 1 tablet (40 mg total) by mouth 2 (two) times daily. 60 tablet 4   HYDROmorphone (DILAUDID) 2 MG tablet Take 3 tablets (6 mg total) by mouth every 2 (two) hours as needed. 120 tablet 0   LORazepam (ATIVAN) 0.5 MG tablet Place 1 tablet (0.5 mg total) under the tongue every 4 (four) hours as needed for anxiety. 60 tablet 0   megestrol (MEGACE) 400 MG/10ML suspension Take 10 mLs (400 mg total) by mouth 2 (two) times daily. 960 mL 2   meloxicam (MOBIC) 7.5 MG tablet Take 1 tablet (7.5 mg total) by mouth 2 (two) times daily. 60 tablet 0   midodrine (PROAMATINE) 2.5 MG tablet Take 1 tablet (2.5 mg total) by mouth 3 (three) times daily with meals. 90 tablet 1   morphine (MS CONTIN) 15 MG 12 hr tablet Take 1 tablet (15  mg total) by mouth every 8 (eight) hours. 90 tablet 0   morphine (MS CONTIN) 30 MG 12 hr tablet Take 1 tablet (30 mg total) by mouth every 8 (eight) hours. 90 tablet 0   OLANZapine (ZYPREXA) 10 MG tablet Take 1 tablet (10 mg total) by mouth at bedtime. 30 tablet 3   ondansetron (ZOFRAN) 4 MG tablet Take 1 tablet (4 mg total) by mouth every 6 (six) hours. 30 tablet 3   scopolamine (TRANSDERM-SCOP) 1 MG/3DAYS Place one patch behind the  ear every three days. 10 patch 3   Sodium Chloride Flush (NORMAL SALINE FLUSH) 0.9 % SOLN Flush abdominal drain with 5 ml sterile saline once daily 300 mL 2   Tbo-Filgrastim (GRANIX) 480 MCG/0.8ML SOSY injection Inject 0.8 mLs (480 mcg total) into the skin daily at 6 PM.  0   No current facility-administered medications for this visit.   Facility-Administered Medications Ordered in Other Visits  Medication Dose Route Frequency Provider Last Rate Last Admin   atropine 1 MG/ML injection            heparin lock flush 100 unit/mL  500 Units Intravenous Once Ennever, Rose Phi, MD       HYDROmorphone (DILAUDID) 4 MG/ML injection            ketorolac (TORADOL) 15 MG/ML injection            LORazepam (ATIVAN) 2 MG/ML injection            sodium chloride flush (NS) 0.9 % injection 10 mL  10 mL Intracatheter Once PRN Erenest Blank, NP       sodium chloride flush (NS) 0.9 % injection 10 mL  10 mL Intravenous PRN Josph Macho, MD   10 mL at 10/31/21 1258    VITAL SIGNS: There were no vitals taken for this visit. There were no vitals filed for this visit.  Estimated body mass index is 28.91 kg/m as calculated from the following:   Height as of 11/28/22: 5\' 1"  (1.549 m).   Weight as of 11/28/22: 153 lb (69.4 kg).   PERFORMANCE STATUS (ECOG) : 1 - Symptomatic but completely ambulatory  Assessment NAD, ambulatory RRR Normal breathing pattern Abdomen tender, slightly distended, ostomy and biliary drain in place  AAO x4  IMPRESSION:   1.   Pain Rebecca Bullock reports her pain is well-controlled.  Some days better than others.  We discussed current regimen at length.  She is taking MS Contin 30 mg every 8 hours Dilaudid 2-4 mg every 2 hours as needed for breakthrough pain, baclofen 10 mg 3 times daily as needed, and Mobic 15 mg daily.  Patient reports noticeable difference when she was taking Mobic twice daily and felt less bloated with decreased inflammation.  We discussed decreasing Mobic to 7.5 mg  to allow administration of twice daily.  Patient and family verbalized understanding.  Will continue to closely monitor and support as needed.     2.  Nausea/vomiting.  Controlled.   3.  Decreased appetite   4.  Goals of Care 5/16: Ms. Ante and family expressed her wishes are to continue to treat for as long as she can focusing on her quality of live and allowing her every opportunity to thrive.   She would really like to get her advanced directives completed. Unfortunately this was not done while hospitalized. Will discuss with Larita Fife, SW to see about assisting with completion.   Patient and family would like to complete MOST form  today. Education provided. Patient and family outlined their wishes for the following treatment decisions:  Cardiopulmonary Resuscitation: Do Not Attempt Resuscitation (DNR/No CPR)  Medical Interventions: Limited Additional Interventions: Use medical treatment, IV fluids and cardiac monitoring as indicated, DO NOT USE intubation or mechanical ventilation. May consider use of less invasive airway support such as BiPAP or CPAP. Also provide comfort measures. Transfer to the hospital if indicated. Avoid intensive care.   Antibiotics: Antibiotics if indicated  IV Fluids: IV fluids if indicated  Feeding Tube: Feeding tube for a defined trial period would not want long-term      4/29- Ms. Boblitt has three adult children. She lives with her daughter Darl Pikes who works during the day and is at home during the night. Lyda Kalata and boyfriend Jeannett Senior check on patient intermittently throughout the day. While patient does not have documents in place as of yet, she requests that these individuals be involved in her care.   Ms. Blonder relates not being ready to make a decision about her code status at this time. She is, however, open to education and expresses wanting to get this completed in the near future.   Ms. Joens current goal is to achieve better pain management,  while maintaining as much alertness and physical function as possible.  We discussed Her current illness and what it means in the larger context of Her on-going co-morbidities. Natural disease trajectory and expectations were discussed.  I discussed the importance of continued conversation with family and their medical providers regarding overall plan of care and treatment options, ensuring decisions are within the context of the patients values and GOCs.  PLAN: Ongoing symptom management support. Overall patient is doing much better.  For pain:  MS Contin 30 mg every 8 hours. Dilaudid to 4 mg every 2-3 hours as needed for severe pain. Baclofen as needed.  Mobic twice daily For nausea/vomiting/appetite: Pepcid for dyspepsia. Continue Scopolamine patch. Continue Zyprexa at bedtime. Zofran and Compazine available as needed. Ativan as needed for nausea/vomiting  Education provided on MS Contin, Dilaudid, Baclofen, Zyprexa, and Ativan and their sedating effects. Instructed NOT to take these medications at the same time due to concerns for over-sedation and respiratory depression. Should take at minimum 1-2 hours apart if needed. Pain contract on file.   Patient aware to call sooner for needs, questions, concerns.  Patient expressed understanding and was in agreement with this plan. She also understands that She can call the clinic at any time with any questions, concerns, or complaints.   Any controlled substances utilized were prescribed in the context of palliative care. PDMP has been reviewed.    Visit consisted of counseling and education dealing with the complex and emotionally intense issues of symptom management and palliative care in the setting of serious and potentially life-threatening illness.Greater than 50%  of this time was spent counseling and coordinating care related to the above assessment and plan.  Willette Alma, AGPCNP-BC  Palliative Medicine Team/[facility]  *Please note that this is a verbal dictation therefore any spelling or grammatical errors are due to the "[employer]agon [employer] One" system interpretation.

## 2022-12-04 ENCOUNTER — Telehealth: Payer: Self-pay | Admitting: *Deleted

## 2022-12-04 ENCOUNTER — Encounter: Payer: Self-pay | Admitting: Hematology & Oncology

## 2022-12-04 ENCOUNTER — Other Ambulatory Visit: Payer: Self-pay | Admitting: Family

## 2022-12-04 ENCOUNTER — Telehealth (HOSPITAL_BASED_OUTPATIENT_CLINIC_OR_DEPARTMENT_OTHER): Payer: Medicaid Other | Admitting: Nurse Practitioner

## 2022-12-04 ENCOUNTER — Encounter: Payer: Self-pay | Admitting: Nurse Practitioner

## 2022-12-04 ENCOUNTER — Other Ambulatory Visit: Payer: Self-pay | Admitting: Hematology & Oncology

## 2022-12-04 ENCOUNTER — Other Ambulatory Visit: Payer: Self-pay

## 2022-12-04 ENCOUNTER — Encounter: Payer: Self-pay | Admitting: Family

## 2022-12-04 ENCOUNTER — Other Ambulatory Visit (HOSPITAL_BASED_OUTPATIENT_CLINIC_OR_DEPARTMENT_OTHER): Payer: Self-pay

## 2022-12-04 DIAGNOSIS — G893 Neoplasm related pain (acute) (chronic): Secondary | ICD-10-CM

## 2022-12-04 DIAGNOSIS — Z515 Encounter for palliative care: Secondary | ICD-10-CM | POA: Diagnosis not present

## 2022-12-04 DIAGNOSIS — R110 Nausea: Secondary | ICD-10-CM

## 2022-12-04 DIAGNOSIS — C189 Malignant neoplasm of colon, unspecified: Secondary | ICD-10-CM

## 2022-12-04 MED ORDER — HYDROMORPHONE HCL 2 MG PO TABS
6.0000 mg | ORAL_TABLET | ORAL | 0 refills | Status: DC | PRN
  Filled 2022-12-04: qty 120, 4d supply, fill #0

## 2022-12-04 MED ORDER — PROCHLORPERAZINE MALEATE 10 MG PO TABS
10.0000 mg | ORAL_TABLET | Freq: Four times a day (QID) | ORAL | 1 refills | Status: DC | PRN
  Filled 2022-12-04: qty 60, 15d supply, fill #0

## 2022-12-04 NOTE — Telephone Encounter (Signed)
Returned [employer] phone call regarding pain and nausea medications. Darl Pikes stated,"mom is taking 6mg  of Dilaudid every two hours. She is taking Morphine 45mg  every eight hours. She is taking Zofran every eight hours prn. Is there anything else she can have for nausea? Per Vicente Masson, she can have Compazine 10 mg po every six hours as needed for nausea and vomiting. I instructed her to alternate the Zofran and compazine. She verbalized understanding.

## 2022-12-04 NOTE — Telephone Encounter (Signed)
I connected with Shadie Sweatman on 12/04/22 at  by phone and verified that I am speaking with the correct person using two identifiers.   I discussed the limitations, risks, security and privacy concerns of performing an evaluation and management service by telemedicine and the availability of in-person appointments. I also discussed with the patient that there may be a patient responsible charge related to this service. The patient expressed understanding and agreed to proceed.   Other persons participating in the visit and their role in the encounter: Darl Pikes (daughter)   Patient's location: Home   Provider's location: [facility]    I connected by phone with Ms. Chaplin to follow-up on her pain. She reports feeling "somewhat" better today. Feels the increase in Dilaudid has helped some. Pain does not seem as severe. Daughter reports she was able to sleep through the night and only woke up once which has not been the case in more than a week.   Recommended continuing with current regimen. Again reminding patient and family if pain is uncontrolled to go to ED for further pain control with possible hospitalization. They verbalized understanding.   While on the phone with patient daughter received a phone call from IR informing them that insurance had denied authorization for Kyphoplasty procedure. States IR plans to request additional review and evaluation.   All questions answered to the best of my ability. Patient and family knows to contact office with any unmet palliative needs.

## 2022-12-05 ENCOUNTER — Other Ambulatory Visit: Payer: Self-pay | Admitting: *Deleted

## 2022-12-05 ENCOUNTER — Other Ambulatory Visit (HOSPITAL_BASED_OUTPATIENT_CLINIC_OR_DEPARTMENT_OTHER): Payer: Self-pay

## 2022-12-05 ENCOUNTER — Other Ambulatory Visit: Payer: Self-pay

## 2022-12-05 DIAGNOSIS — Z515 Encounter for palliative care: Secondary | ICD-10-CM

## 2022-12-05 DIAGNOSIS — M792 Neuralgia and neuritis, unspecified: Secondary | ICD-10-CM

## 2022-12-05 DIAGNOSIS — C189 Malignant neoplasm of colon, unspecified: Secondary | ICD-10-CM

## 2022-12-05 MED ORDER — HYDROMORPHONE HCL 2 MG PO TABS
ORAL_TABLET | ORAL | 0 refills | Status: DC
Start: 2022-12-05 — End: 2022-12-12
  Filled 2022-12-05: qty 200, 7d supply, fill #0

## 2022-12-05 MED ORDER — HYDROMORPHONE HCL 8 MG PO TABS
8.0000 mg | ORAL_TABLET | ORAL | 0 refills | Status: DC | PRN
Start: 2022-12-05 — End: 2022-12-05
  Filled 2022-12-05: qty 90, 8d supply, fill #0

## 2022-12-06 ENCOUNTER — Telehealth: Payer: Medicaid Other

## 2022-12-12 ENCOUNTER — Other Ambulatory Visit: Payer: Self-pay

## 2022-12-12 ENCOUNTER — Other Ambulatory Visit: Payer: Self-pay | Admitting: Hematology & Oncology

## 2022-12-12 ENCOUNTER — Other Ambulatory Visit (HOSPITAL_BASED_OUTPATIENT_CLINIC_OR_DEPARTMENT_OTHER): Payer: Self-pay

## 2022-12-12 ENCOUNTER — Inpatient Hospital Stay: Payer: Medicaid Other | Admitting: Hematology & Oncology

## 2022-12-12 ENCOUNTER — Telehealth: Payer: Self-pay

## 2022-12-12 ENCOUNTER — Encounter: Payer: Self-pay | Admitting: Family

## 2022-12-12 ENCOUNTER — Inpatient Hospital Stay: Payer: Medicaid Other

## 2022-12-12 ENCOUNTER — Encounter: Payer: Self-pay | Admitting: Hematology & Oncology

## 2022-12-12 VITALS — BP 113/66 | HR 86 | Temp 97.8°F | Resp 18 | Ht 61.0 in | Wt 154.0 lb

## 2022-12-12 DIAGNOSIS — C189 Malignant neoplasm of colon, unspecified: Secondary | ICD-10-CM

## 2022-12-12 DIAGNOSIS — C19 Malignant neoplasm of rectosigmoid junction: Secondary | ICD-10-CM | POA: Diagnosis not present

## 2022-12-12 DIAGNOSIS — C787 Secondary malignant neoplasm of liver and intrahepatic bile duct: Secondary | ICD-10-CM

## 2022-12-12 DIAGNOSIS — Z515 Encounter for palliative care: Secondary | ICD-10-CM

## 2022-12-12 LAB — CBC WITH DIFFERENTIAL (CANCER CENTER ONLY)
Abs Immature Granulocytes: 0.04 10*3/uL (ref 0.00–0.07)
Basophils Absolute: 0 10*3/uL (ref 0.0–0.1)
Basophils Relative: 0 %
Eosinophils Absolute: 0.3 10*3/uL (ref 0.0–0.5)
Eosinophils Relative: 4 %
HCT: 32.2 % — ABNORMAL LOW (ref 36.0–46.0)
Hemoglobin: 9.9 g/dL — ABNORMAL LOW (ref 12.0–15.0)
Immature Granulocytes: 1 %
Lymphocytes Relative: 9 %
Lymphs Abs: 0.7 10*3/uL (ref 0.7–4.0)
MCH: 28.4 pg (ref 26.0–34.0)
MCHC: 30.7 g/dL (ref 30.0–36.0)
MCV: 92.5 fL (ref 80.0–100.0)
Monocytes Absolute: 0.6 10*3/uL (ref 0.1–1.0)
Monocytes Relative: 7 %
Neutro Abs: 6.3 10*3/uL (ref 1.7–7.7)
Neutrophils Relative %: 79 %
Platelet Count: 169 10*3/uL (ref 150–400)
RBC: 3.48 MIL/uL — ABNORMAL LOW (ref 3.87–5.11)
RDW: 18.2 % — ABNORMAL HIGH (ref 11.5–15.5)
WBC Count: 7.9 10*3/uL (ref 4.0–10.5)
nRBC: 0 % (ref 0.0–0.2)

## 2022-12-12 LAB — CMP (CANCER CENTER ONLY)
ALT: 14 U/L (ref 0–44)
AST: 24 U/L (ref 15–41)
Albumin: 3.5 g/dL (ref 3.5–5.0)
Alkaline Phosphatase: 268 U/L — ABNORMAL HIGH (ref 38–126)
Anion gap: 10 (ref 5–15)
BUN: 6 mg/dL (ref 6–20)
CO2: 21 mmol/L — ABNORMAL LOW (ref 22–32)
Calcium: 9.2 mg/dL (ref 8.9–10.3)
Chloride: 105 mmol/L (ref 98–111)
Creatinine: 0.49 mg/dL (ref 0.44–1.00)
GFR, Estimated: >60 mL/min (ref >60)
Glucose, Bld: 148 mg/dL — ABNORMAL HIGH (ref 70–99)
Potassium: 3.5 mmol/L (ref 3.5–5.1)
Sodium: 136 mmol/L (ref 135–145)
Total Bilirubin: 1 mg/dL (ref 0.3–1.2)
Total Protein: 7.2 g/dL (ref 6.5–8.1)

## 2022-12-12 LAB — IRON AND IRON BINDING CAPACITY (CC-WL,HP ONLY)
Iron: 36 ug/dL (ref 28–170)
Saturation Ratios: 14 % (ref 10.4–31.8)
TIBC: 252 ug/dL (ref 250–450)
UIBC: 216 ug/dL (ref 148–442)

## 2022-12-12 LAB — CEA (IN HOUSE-CHCC): CEA (CHCC-In House): 14.34 ng/mL — ABNORMAL HIGH (ref 0.00–5.00)

## 2022-12-12 LAB — FERRITIN: Ferritin: 824 ng/mL — ABNORMAL HIGH (ref 11–307)

## 2022-12-12 MED ORDER — HYDROMORPHONE HCL 4 MG/ML IJ SOLN
4.0000 mg | Freq: Once | INTRAMUSCULAR | Status: AC
  Administered 2022-12-12: 4 mg via INTRAVENOUS
  Filled 2022-12-12: qty 1

## 2022-12-12 MED ORDER — SODIUM CHLORIDE 0.9 % IV SOLN
20.0000 mg | Freq: Once | INTRAVENOUS | Status: AC
  Administered 2022-12-12: 20 mg via INTRAVENOUS
  Filled 2022-12-12: qty 20

## 2022-12-12 MED ORDER — KETOROLAC TROMETHAMINE 15 MG/ML IJ SOLN
15.0000 mg | Freq: Once | INTRAMUSCULAR | Status: AC
  Administered 2022-12-12: 15 mg via INTRAVENOUS
  Filled 2022-12-12: qty 1

## 2022-12-12 MED ORDER — SODIUM CHLORIDE 0.9 % IV SOLN: INTRAVENOUS | Status: DC

## 2022-12-12 MED ORDER — HYDROMORPHONE HCL 2 MG PO TABS
6.0000 mg | ORAL_TABLET | ORAL | 0 refills | Status: DC | PRN
  Filled 2022-12-12: qty 200, 8d supply, fill #0
  Filled 2022-12-15: qty 200, 6d supply, fill #0

## 2022-12-12 NOTE — Telephone Encounter (Signed)
Referral made to [facility] with [facility] per VOV Dr Myna Hidalgo. Tonya aware & will notify nursing that Dr Myna Hidalgo would like to order CAD pump for pain control. dph

## 2022-12-12 NOTE — Patient Instructions (Signed)

## 2022-12-12 NOTE — Patient Instructions (Signed)
Pain Medicine Instructions You may need pain medicine after an injury or illness. There are many types of pain medicine. Two common types of pain medicine are: Non-opioid pain medicines. These include: Over-the-counter (OTC) medicines such as acetaminophen or non-steroidal anti-inflammatory medicines (NSAIDS). Prescription medicines such as gabapentin or pregabalin. Opioid prescription pain medicines. These include: Hydrocodone. Oxycodone. Tramadol. Pain medicine may be prescribed to relieve most or all of your pain. It may not be possible to make all of your pain go away, but you should be comfortable enough to move, breathe, and do normal activities. How can pain medicines affect me? Pain medicines can cause side effects such as: Nausea. Vomiting. Abdominal pain. Opioid pain medicines can cause additional side effects, such as: Constipation. Drowsiness. Confusion. Difficulty breathing (respiratory depression). Dependence and addiction (opioid use disorder). Using opioid pain medicines for longer than 3 days increases your risk of these side effects. Taking opioid pain medicine for a long period of time can affect your ability to do daily tasks. It also puts you at risk for: Motor vehicle accidents. Depression. Suicide. Heart attack. If they are not taken correctly, non-opioid and opioid medicines may also put you at risk for: Liver problems. Kidney problems. Overdose. This is taking too much of the medicine. It can sometimes lead to death. What actions can I take to lower my risk of problems? Know your pain treatment plan To manage your pain successfully, you and your health care provider need to understand each other and work together. To do this: Discuss the goals of your treatment, including how much pain you might expect to have and how you will manage the pain. Ask your health care provider to refer you to one or more specialists who can help you manage pain in other ways.  Some other ways of managing pain include physical therapy, exercise, massage, or biofeedback. Review the risks and benefits of taking pain medicines for your condition. Be honest about the amount of medicines you take, and about any drugs or alcohol you use. Get pain medicine prescriptions from only one health care provider. Keep all follow-up visits. This is important. Take your medicine as directed  Take your pain medicine exactly as told by your health care provider. Take it only when you need it. If your pain gets less severe, you may take less than your prescribed dose if your health care provider approves. If you are not having pain, do nottake pain medicine unless your health care provider tells you to take it. If your pain medicine contains acetaminophen, do not take any other acetaminophen while taking this medicine. Acetaminophen is found in many over-the-counter and prescription medicines. An overdose of acetaminophen can result in severe liver damage. If your pain is severe, do nottry to treat it yourself by taking more pills than instructed on your prescription. Contact your health care provider for help. Write down the times when you take your pain medicine. It is easy to become confused while on pain medicine. Writing the time can help you avoid overdose. Take other over-the-counter or prescription medicines only as told by your health care provider. Restrict your activity as directed While you are taking prescription pain medicine, and for 8 hours after your last dose, follow these instructions: Do not drive. Do not use machinery or power tools. Do not sign legal documents. Do not drink alcohol. Do not take sleeping pills. Do not supervise children by yourself. Do not do activities that require climbing or being in high places. Do  not go to a lake, river, ocean, spa, or swimming pool unless an adult is nearby who can monitor and help you.  Keep pets and people safe Keep pain  medicine in a locked cabinet, or in a secure area where children and pets cannot reach it. Never share your prescription pain medicine with anyone. Do not save any leftover pills. If you have leftover medicine, you can: Bring the medicine to a prescription take-back program. This is usually offered by the county or Patent examiner. Bring it to a pharmacy that has a drug disposal container. Throw it out in the trash. Check the label or package insert of your medicine to see whether this is safe to do. If it is safe to throw it out, remove the medicine from the container and mix it with material that makes it unusable, such as pet waste, before putting it in the trash. Flush it down the toilet only if this is safe to do. To find out, check the label or package insert of your medicine. Information is also available at the [place] Food and Drug Administration website: [place]. Treat or prevent constipation Taking pain medicines increases your risk for constipation. To prevent or treat this problem: Drink enough water to keep your urine pale yellow. Take over-the-counter or prescription medicines. You may need to take stool softeners. Eat foods that are high in fiber, such as beans, whole grains, and fresh fruits and vegetables. Limit foods that are high in fat and processed sugars, such as fried or sweet foods. Contact a health care provider if: Your medicine is not relieving your pain. You have a rash. You have nausea and vomiting. You feel depressed. Get help right away if: You have difficulty breathing. Your breathing is slower or more shallow than normal. You feel confused. You are too sleepy or you have difficulty staying awake. Your skin or lips turn pale or bluish in color. Your tongue swells. You have thoughts of harming yourself or harming others. These symptoms may represent a serious problem that is an emergency. Do not wait to see if the symptoms will go away. Get medical help right  away. Call your local emergency services (911 in the [place] Do not drive yourself to the hospital. If you ever feel like you may hurt yourself or others, or have thoughts about taking your own life, get help right away. Go to your nearest emergency department or: Call your local emergency services (911 in the [place] Call the [facility] (702-884-3740 in the [place] Call a suicide crisis helpline, such as the National Suicide Prevention Lifeline at (510) 332-6902 or 988 in the [place] This is open 24 hours a day in the [place] Text the Crisis Text Line at 615 828 3953 (in the [place] Summary It is important to follow instructions from your health care provider when you are taking prescription pain medicines. Pain medicine can help reduce pain but may also cause side effects. Make sure you understand the risks and benefits of taking pain medicine for your condition. Take steps to lower your risk of problems by taking medicine exactly as told, restricting activity, keeping others safe, preventing constipation, and having a pain treatment plan. This information is not intended to replace advice given to you by your health care provider. Make sure you discuss any questions you have with your health care provider. Document Revised: 12/07/2020 Document Reviewed: 09/21/2020 Elsevier Patient Education  2024 [employer].

## 2022-12-12 NOTE — Progress Notes (Signed)
Hematology and Oncology Follow Up Visit  Rebecca Bullock [id number] April 22, 1966 57 y.o. 12/12/2022   Principle Diagnosis:  Metastatic colorectal cancer-liver metastasis- BRAF (+)   Current Therapy:        Status post cycle 1 of chemotherapy with FOLFOXIRI/Avastin Encorafenib/Vectibix -- started on 06/19/2021, s/p cycle #6 -- d/c on 10/31/2021 Intrahepatic TACE -- 03/29/2022 FOLFOXIRI/Avastin-- s/p cycle  #5-- start on 02/19/2022, -oxaliplatin dropped on 04/03/2022 -- d/c on 07/03/2022 IV iron-Ferrlecit given on 05/10/2022  Lonsurf/Avastin -- s/p cycle #1 -- start on 07/07/2022 -DC on 08/15/2022 Zometa 4 mg IV every 3 months -next dose on 11/2022  XRT to L1 vertebral body --start on 07/10/2022 Fruquintinib 5 mg po q day (21/7) --  start on 04/`09/2022 --changed to 3 mg p.o. daily (21/7) on 09/25/2022 FOLFIRI/Avastin -- s/p cycle #2 -- start on 10/17/2022 --DC on 12/12/2022               Interim History:  [name] is here today for follow-up.  Unfortunately, her decline continues.  She is having so much more pain.  The pain is in her back.  So far, she has not been able to have the kyphoplasty because of insurance issues.  However, today I found that insurance has approved the procedure.  She will have this done on 12/14/2022.  Hopefully this will help with her pain.  I do think that she is going to be in need of Hospice now.  I just do not see that we are going to get any more benefit out of treating her with systemic chemotherapy.  She has the prep pain problems.  She is on MS Contin and Dilaudid.  This seems to be helping her pain.  Again I spoke that she and her family about Hospice.  I really think that Hospice would be a great way of trying to help her now.  She has done everything we have asked her to do.  She has been through multiple, multiple lines of chemotherapy.  I just do not believe that her body is going to be able to handle any more chemotherapy and I think that at best, any response  rate will be less than 10%.  Of note, her CEA level today was 14.3 which is holding relatively steady.  I want her to have comfort, respect and dignity.  She clearly does not have comfort.  I think that what might be reasonable for [place] is to try her on a CADD pump.  I think Hospice can help as manage this.  Maybe, with the kyphoplasty, she we will have much better pain control.  She does have a colostomy.  She does have the biliary drain.  There are some leakage from the biliary drain.  I am not surprised by this.  I am just not sure what else can be done regarding this right now.  I did have a long talk with her about her wishes to be kept alive artificially.  I told her that if she were to go on life support, she would not come off it because her body would know that something is doing the work for it.  She is going to think about this.  She will talk to her family about this.  Her last prealbumin back in early June was down to 12.  I cannot imagine that this is going to be any better right now.  Again, I want to make sure that we begin to focus on her quality of  life and her comfort.  I think is fantastic that the insurance has approved the kyphoplasty.  I really hope that this is going to help her pain.  I just do not want her getting sick from further chemotherapy.  Again I just do not see that chemotherapy is making a dramatic impact on her overall life.  She has a performance status for now of about ECOG 3.   Medications:  Allergies as of 12/12/2022       Reactions   Augmentin [amoxicillin-pot Clavulanate] Diarrhea, Other (See Comments)   Extreme diarrhea, abdominal pain.   Irinotecan Other (See Comments)   Flushing, tingling, visual disturbance   Metronidazole Diarrhea, Other (See Comments)   Vancomycin Rash, Other (See Comments)   "Red Man Syndrome"   Latex Rash   Tape Rash   Wound Dressing Adhesive Rash        Medication List        Accurate as of December 12, 2022 10:33  AM. If you have any questions, ask your nurse or doctor.          alum & mag hydroxide-simeth suspension-nystatin suspension-diphenhydrAMINE liquid Take 5 mLs by mouth 4 (four) times daily as needed.   baclofen 10 MG tablet Commonly known as: LIORESAL Take 1 tablet (10 mg total) by mouth 3 (three) times daily. What changed:  when to take this reasons to take this   dexamethasone 4 MG tablet Commonly known as: DECADRON Take 2 tablets (8 mg total) by mouth daily. Start the day after chemotherapy for 2 days. Take with food.   famotidine 40 MG tablet Commonly known as: PEPCID Take 1 tablet (40 mg total) by mouth 2 (two) times daily.   HYDROmorphone 2 MG tablet Commonly known as: DILAUDID Take 6 mg (3 tablets) by mouth every two hours as needed for pain.   LORazepam 0.5 MG tablet Commonly known as: ATIVAN Place 1 tablet (0.5 mg total) under the tongue every 4 (four) hours as needed for anxiety.   megestrol 40 MG/ML suspension Commonly known as: MEGACE Take 10 mLs (400 mg total) by mouth 2 (two) times daily.   meloxicam 7.5 MG tablet Commonly known as: Mobic Take 1 tablet (7.5 mg total) by mouth 2 (two) times daily.   midodrine 2.5 MG tablet Commonly known as: PROAMATINE Take 1 tablet (2.5 mg total) by mouth 3 (three) times daily with meals.   morphine 30 MG 12 hr tablet Commonly known as: MS CONTIN Take 1 tablet (30 mg total) by mouth every 8 (eight) hours.   morphine 15 MG 12 hr tablet Commonly known as: MS CONTIN Take 1 tablet (15 mg total) by mouth every 8 (eight) hours.   Normal Saline Flush 0.9 % Soln Flush abdominal drain with 5 ml sterile saline once daily   OLANZapine 10 MG tablet Commonly known as: ZYPREXA Take 1 tablet (10 mg total) by mouth at bedtime.   ondansetron 4 MG tablet Commonly known as: ZOFRAN Take 1 tablet (4 mg total) by mouth every 6 (six) hours.   prochlorperazine 10 MG tablet Commonly known as: COMPAZINE Take 1 tablet (10 mg total)  by mouth every 6 (six) hours as needed for nausea or vomiting.   Tbo-Filgrastim 480 MCG/0.8ML Sosy injection Commonly known as: GRANIX Inject 0.8 mLs (480 mcg total) into the skin daily at 6 PM.   Transderm-Scop 1 MG/3DAYS Generic drug: scopolamine Place one patch behind the ear every three days.        Allergies:  Allergies  Allergen Reactions   Augmentin [Amoxicillin-Pot Clavulanate] Diarrhea and Other (See Comments)    Extreme diarrhea, abdominal pain.   Irinotecan Other (See Comments)    Flushing, tingling, visual disturbance   Metronidazole Diarrhea and Other (See Comments)   Vancomycin Rash and Other (See Comments)    "Red Man Syndrome"   Latex Rash   Tape Rash   Wound Dressing Adhesive Rash    Past Medical History, Surgical history, Social history, and Family History were reviewed and updated.  Review of Systems: Review of Systems  Constitutional: Negative.   HENT: Negative.    Eyes: Negative.   Respiratory: Negative.    Cardiovascular: Negative.   Gastrointestinal: Negative.        Constipation alternating with diarrhea.   Genitourinary: Negative.   Musculoskeletal: Negative.   Skin: Negative.   Neurological: Negative.   Endo/Heme/Allergies: Negative.   Psychiatric/Behavioral: Negative.       Physical Exam: Temperature is 97.7.  Pulse is 86.  Blood pressure 107/90.  Weight is 153 pounds.   Wt Readings from Last 3 Encounters:  12/12/22 154 lb (69.9 kg)  11/28/22 153 lb (69.4 kg)  11/14/22 154 lb (69.9 kg)    Physical Exam Vitals reviewed.  Constitutional:      Comments: This is a well-developed and well-nourished white female.  There is no scleral icterus.    HENT:     Head: Normocephalic and atraumatic.  Eyes:     General: Scleral icterus present.     Pupils: Pupils are equal, round, and reactive to light.  Cardiovascular:     Rate and Rhythm: Normal rate and regular rhythm.     Heart sounds: Normal heart sounds.  Pulmonary:     Effort:  Pulmonary effort is normal.     Breath sounds: Normal breath sounds.  Abdominal:     General: Bowel sounds are normal.     Palpations: Abdomen is soft.     Comments: Abdominal exam is soft.  She has colostomy intact.  She has a biliary drain catheter also.  There is no obvious fluid wave.  There is no obvious abdominal mass.  Her liver edge might be at the right costal margin.   Musculoskeletal:        General: No tenderness or deformity. Normal range of motion.     Cervical back: Normal range of motion.  Lymphadenopathy:     Cervical: No cervical adenopathy.  Skin:    General: Skin is warm and dry.     Findings: No erythema or rash.  Neurological:     Mental Status: She is alert and oriented to person, place, and time.  Psychiatric:        Behavior: Behavior normal.        Thought Content: Thought content normal.        Judgment: Judgment normal.   Lab Results  Component Value Date   WBC 7.9 12/12/2022   HGB 9.9 (L) 12/12/2022   HCT 32.2 (L) 12/12/2022   MCV 92.5 12/12/2022   PLT 169 12/12/2022   Lab Results  Component Value Date   FERRITIN 512 (H) 11/14/2022   IRON 48 11/14/2022   TIBC 346 11/14/2022   UIBC 298 11/14/2022   IRONPCTSAT 14 11/14/2022   Lab Results  Component Value Date   RETICCTPCT 1.6 08/07/2022   RBC 3.48 (L) 12/12/2022   No results found for: "KPAFRELGTCHN", "LAMBDASER", "KAPLAMBRATIO" No results found for: "IGGSERUM", "IGA", "IGMSERUM" No results found for: "TOTALPROTELP", "ALBUMINELP", "A1GS", "  A2GS", "BETS", "BETA2SER", "GAMS", "MSPIKE", "SPEI"   Chemistry      Component Value Date/Time   NA 136 12/12/2022 0915   K 3.5 12/12/2022 0915   CL 105 12/12/2022 0915   CO2 21 (L) 12/12/2022 0915   BUN 6 12/12/2022 0915   CREATININE 0.49 12/12/2022 0915   CREATININE 0.56 03/23/2021 0000      Component Value Date/Time   CALCIUM 9.2 12/12/2022 0915   ALKPHOS 268 (H) 12/12/2022 0915   AST 24 12/12/2022 0915   ALT 14 12/12/2022 0915   BILITOT 1.0  12/12/2022 0915       Impression and Plan: Ms. Lick is a very charming  57 yo caucasian female with metastatic colon cancer.  She had disease confined to her liver.  However, she then began to have progression.  She had disease in her L1 vertebral body.  She has received radiation therapy to this.  We had her on Lonsurf and she could not tolerate this at all.  Again, we will now stop her chemotherapy.  Again we have to get Hospice involved.  Will be incredibly interesting to see how well she does with the kyphoplasty at L1.  Maybe, with the CADD pump, we can decrease the amount of pain medication that she will need.  I think this would be a great way of trying to help with her pain issues.  I want her to be able to be somewhat functional and not be "existing" because of the pain.  I will have to see about getting the CADD pump set up for her.  We will call [facility] [place] today.  I told her that we would still be seeing her.  We will still be involved with her care and try to help her quality of life.  It is hard to say how long we are looking at.  I told her that I really cannot tell her how long she had right now.  If we just get her pain under better control, I know that she will live longer as it would be a lot less stress on her body.  For now, I would like to see her back probably in a couple weeks.   Josph Macho, MD 7/17/202410:33 AM

## 2022-12-13 ENCOUNTER — Other Ambulatory Visit (HOSPITAL_COMMUNITY): Payer: Self-pay | Admitting: Student

## 2022-12-13 ENCOUNTER — Other Ambulatory Visit (HOSPITAL_BASED_OUTPATIENT_CLINIC_OR_DEPARTMENT_OTHER): Payer: Self-pay

## 2022-12-13 ENCOUNTER — Other Ambulatory Visit: Payer: Self-pay | Admitting: Physician Assistant

## 2022-12-13 DIAGNOSIS — C787 Secondary malignant neoplasm of liver and intrahepatic bile duct: Secondary | ICD-10-CM

## 2022-12-13 DIAGNOSIS — C801 Malignant (primary) neoplasm, unspecified: Secondary | ICD-10-CM

## 2022-12-13 NOTE — H&P (Signed)
Chief Complaint: Metastatic colon cancer with L1 pathological fracture. Patient presents for radiofrequency ablation with balloon Kyphoplasty (NIR) and biliary drain exchange and possible upsize (IR)  Referring Physician(s): Dr. Ross Marcus  Supervising Physician: Baldemar Lenis  Patient Status: [facility] - Out-pt  History of Present Illness: Rebecca Bullock is a 57 y.o. female outpatient. History of metastatic colon cancer s/p  laparoscopic loop ileostomy on 3.26.24. Found to have jaundice with biliary duct dilation s/p internal external biliary drain placement by IR on 5.8.24.  Last exchanged on 6.10.24 (10 Fr).Recently admitted for anemia, fever, SHOB , tachycardia and hypotension. Found to be septic. Hospital stay complicated by, intractable back pain related to pathologic L1 fracture. Patient seen in the [facility] with Dr. Tommie Sams on 7.2.24 for further evaluation of back pain unresolved with pain medication.  At that time a detailed discussion regarding the Patient's medical condition including but not limited to possible treatment options took place. Following that discussion the Patient elected to proceed with radiofrequency ablation followed by balloon Kyphoplasty.  The Patient presents today for L1 radio frequency ablation and balloon Kyphoplasty and biliary drain exchange.  Patient alert and laying in bed,calm. Endorses back pain that radiates around to her abdomen. Denies any fevers, headache, chest pain, SOB, cough,  nausea, vomiting or bleeding. Return precautions and treatment recommendations and follow-up discussed with the patient  who is agreeable with the plan.    Past Medical History:  Diagnosis Date   Colon cancer metastasized to liver (HCC) 05/05/2021   Family history of lung cancer    Family history of pancreatic cancer    Goals of care, counseling/discussion 05/05/2021   History of radiation therapy    Lumbar Spine- 07/12/22-07/25/22- Dr. Antony Blackbird    Past Surgical History:  Procedure Laterality Date   IR 3D INDEPENDENT WKST  03/15/2022   IR ANGIOGRAM SELECTIVE EACH ADDITIONAL VESSEL  03/15/2022   IR ANGIOGRAM SELECTIVE EACH ADDITIONAL VESSEL  03/15/2022   IR ANGIOGRAM SELECTIVE EACH ADDITIONAL VESSEL  03/15/2022   IR ANGIOGRAM SELECTIVE EACH ADDITIONAL VESSEL  03/29/2022   IR ANGIOGRAM SELECTIVE EACH ADDITIONAL VESSEL  03/29/2022   IR ANGIOGRAM VISCERAL SELECTIVE  03/15/2022   IR ANGIOGRAM VISCERAL SELECTIVE  03/29/2022   IR EMBO ARTERIAL NOT HEMORR HEMANG INC GUIDE ROADMAPPING  03/15/2022   IR EMBO TUMOR ORGAN ISCHEMIA INFARCT INC GUIDE ROADMAPPING  03/29/2022   IR EXCHANGE BILIARY DRAIN  11/05/2022   IR IMAGING GUIDED PORT INSERTION  05/11/2021   IR INT EXT BILIARY DRAIN WITH CHOLANGIOGRAM  10/03/2022   IR RADIOLOGIST EVAL & MGMT  02/15/2022   IR RADIOLOGIST EVAL & MGMT  04/23/2022   IR RADIOLOGIST EVAL & MGMT  06/25/2022   IR US GUIDE VASC ACCESS RIGHT  03/29/2022   LAPAROSCOPY N/A 08/21/2022   Procedure: LAPAROSCOPIC LOOP ILEOSTOMY;  Surgeon: Emelia Loron, MD;  Location: [facility] ORS;  Service: General;  Laterality: N/A;   uterine ablation      Allergies: Augmentin [amoxicillin-pot clavulanate], Irinotecan, Metronidazole, Vancomycin, Latex, Tape, and Wound dressing adhesive  Medications: Prior to Admission medications   Medication Sig Start Date End Date Taking? Authorizing Provider  alum & mag hydroxide-simeth suspension-nystatin suspension-diphenhydrAMINE liquid Take 5 mLs by mouth 4 (four) times daily as needed. 11/12/22   Josph Macho, MD  baclofen (LIORESAL) 10 MG tablet Take 1 tablet (10 mg total) by mouth 3 (three) times daily. Patient taking differently: Take 10 mg by mouth as needed for muscle spasms. 08/28/22  Josph Macho, MD  famotidine (PEPCID) 40 MG tablet Take 1 tablet (40 mg total) by mouth 2 (two) times daily. 08/07/22   Josph Macho, MD  HYDROmorphone (DILAUDID) 2 MG tablet Take 3 tablets (6 mg  total) by mouth every 2 (two) hours as needed for pain. 12/12/22   Josph Macho, MD  LORazepam (ATIVAN) 0.5 MG tablet Place 1 tablet (0.5 mg total) under the tongue every 4 (four) hours as needed for anxiety. 09/21/22   Josph Macho, MD  megestrol (MEGACE) 400 MG/10ML suspension Take 10 mLs (400 mg total) by mouth 2 (two) times daily. 09/21/22   Josph Macho, MD  meloxicam (MOBIC) 7.5 MG tablet Take 1 tablet (7.5 mg total) by mouth 2 (two) times daily. 12/01/22   Pickenpack-Cousar, Arty Baumgartner, NP  midodrine (PROAMATINE) 2.5 MG tablet Take 1 tablet (2.5 mg total) by mouth 3 (three) times daily with meals. 11/08/22   Marinda Elk, MD  morphine (MS CONTIN) 15 MG 12 hr tablet Take 1 tablet (15 mg total) by mouth every 8 (eight) hours. 11/08/22   Josph Macho, MD  morphine (MS CONTIN) 30 MG 12 hr tablet Take 1 tablet (30 mg total) by mouth every 8 (eight) hours. 11/08/22   Josph Macho, MD  OLANZapine (ZYPREXA) 10 MG tablet Take 1 tablet (10 mg total) by mouth at bedtime. 09/21/22   Josph Macho, MD  ondansetron (ZOFRAN) 4 MG tablet Take 1 tablet (4 mg total) by mouth every 6 (six) hours. 10/08/22   Hollice Espy, MD  prochlorperazine (COMPAZINE) 10 MG tablet Take 1 tablet (10 mg total) by mouth every 6 (six) hours as needed for nausea or vomiting. 12/04/22   Erenest Blank, NP  scopolamine (TRANSDERM-SCOP) 1 MG/3DAYS Place one patch behind the ear every three days. 09/10/22   Josph Macho, MD  Sodium Chloride Flush (NORMAL SALINE FLUSH) 0.9 % SOLN Flush abdominal drain with 5 ml sterile saline once daily 11/19/22   Kennieth Francois, PA  Tbo-Filgrastim [facility]) 480 MCG/0.8ML SOSY injection Inject 0.8 mLs (480 mcg total) into the skin daily at 6 PM. 11/08/22   Marinda Elk, MD     Family History  Problem Relation Age of Onset   Lung cancer Mother    Diabetes Maternal Grandmother    Pancreatic cancer Maternal Grandfather    Multiple sclerosis Maternal Aunt    Alcohol  abuse Maternal Uncle     Social History   Socioeconomic History   Marital status: Single    Spouse name: Not on file   Number of children: Not on file   Years of education: Not on file   Highest education level: Not on file  Occupational History   Not on file  Tobacco Use   Smoking status: Never   Smokeless tobacco: Never  Vaping Use   Vaping status: Never Used  Substance and Sexual Activity   Alcohol use: Not Currently    Comment: occasionally   Drug use: Not Currently   Sexual activity: Not Currently  Other Topics Concern   Not on file  Social History Narrative   Not on file   Social Determinants of Health   Financial Resource Strain: Medium Risk (05/18/2021)   Overall Financial Resource Strain (CARDIA)    Difficulty of Paying Living Expenses: Somewhat hard  Food Insecurity: No Food Insecurity (11/03/2022)   Hunger Vital Sign    Worried About Running Out of Food in the Last Year:  Never true    Ran Out of Food in the Last Year: Never true  Transportation Needs: No Transportation Needs (11/03/2022)   PRAPARE - Administrator, Civil Service (Medical): No    Lack of Transportation (Non-Medical): No  Physical Activity: Not on file  Stress: Not on file  Social Connections: Unknown (10/06/2021)   Received from [facility]   Social Network    Social Network: Not on file    Review of Systems: A 12 point ROS discussed and pertinent positives are indicated in the HPI above.  All other systems are negative.  Review of Systems  Constitutional:  Negative for fatigue and fever.  HENT:  Negative for congestion.   Respiratory:  Negative for cough and shortness of breath.   Gastrointestinal:  Negative for abdominal pain, diarrhea, nausea and vomiting.  Musculoskeletal:  Positive for back pain.  Psychiatric/Behavioral:  Dysphoric mood: that radiates around to her abdomen.     Vital Signs: B/p113/66 hr 86, temp 97.8 rr 18   Physical Exam Vitals and nursing note  reviewed.  Constitutional:      Appearance: She is well-developed.  HENT:     Head: Normocephalic and atraumatic.  Eyes:     Conjunctiva/sclera: Conjunctivae normal.  Cardiovascular:     Rate and Rhythm: Normal rate and regular rhythm.     Heart sounds: Normal heart sounds.  Pulmonary:     Effort: Pulmonary effort is normal.     Breath sounds: Normal breath sounds.  Abdominal:     Comments:  Ostomy bag present. Left sided biliary drain to gravity bag  Musculoskeletal:        General: Normal range of motion.     Cervical back: Normal range of motion.  Skin:    General: Skin is warm.  Neurological:     Mental Status: She is alert and oriented to person, place, and time.     Imaging: No results found.  Labs:  CBC: Recent Labs    11/14/22 0848 11/28/22 0905 12/12/22 0915 12/14/22 0945  WBC 4.3 8.2 7.9 10.6*  HGB 9.6* 9.4* 9.9* 11.7*  HCT 31.0* 31.0* 32.2* 38.0  PLT 184 308 169 258    COAGS: Recent Labs    03/15/22 0801 03/29/22 0820 10/02/22 1402 11/02/22 1438  INR 1.0 1.0 1.2 1.5*  APTT  --   --   --  29    BMP: Recent Labs    11/09/22 0949 11/14/22 0848 11/28/22 0905 12/12/22 0915  NA 137 138 139 136  K 3.7 3.2* 3.6 3.5  CL 103 105 109 105  CO2 25 23 21* 21*  GLUCOSE 128* 135* 135* 148*  BUN <5* 7 12 6   CALCIUM 9.4 9.3 9.1 9.2  CREATININE 0.51 0.49 0.55 0.49  GFRNONAA >60 >60 >60 >60    LIVER FUNCTION TESTS: Recent Labs    11/09/22 0949 11/14/22 0848 11/28/22 0905 12/12/22 0915  BILITOT 1.6* 1.1 0.9 1.0  AST 16 21 32 24  ALT 11 11 21 14   ALKPHOS 201* 304* 360* 268*  PROT 6.9 7.0 7.7 7.2  ALBUMIN 3.5 3.5 3.6 3.5     Assessment and Plan:  57 y.o female outpatient. History of metastatic colon cancer s/p  laparoscopic loop ileostomy on 3.26.24. Found to have jaundice with biliary duct dilation s/p internal external biliary drain placement by IR on 5.8.24.  Last exchange on 6.10.24 (10 Fr).Recently admitted for anemia, fever, SHOB ,  tachycardia and hypotension. Found to be septic.  Hospital stay complicated by, intractable back pain related to pathologic L1 fracture. Patient seen in the [facility] with Dr. Tommie Sams on 7.2.24 for further evaluation of back pain unresolved with pain medication. **. At that time a detailed discussion regarding the Patient's medical condition including but not limited to possible treatment options took place. Following that discussion the Patient elected to proceed with radiofrequency ablation followed by balloon Kyphoplasty.  The Patient presents today for L1 radio frequency ablation and balloon Kyphoplasty and biliary drain exchange.  Labs pending.  All medications are within acceptable parameters. Allergies include vancomycin, latex, tape, Augmentin and metronidazole. Patient has been NPO since midnight.   The Risks and benefits of embolization were discussed with the patient including, but not limited to bleeding, infection, vascular injury, post operative pain, or contrast induced renal failure.  This procedure involves the use of X-rays and because of the nature of the planned procedure, it is possible that we will have prolonged use of X-ray fluoroscopy.  Potential radiation risks to you include (but are not limited to) the following: - A slightly elevated risk for cancer several years later in life. This risk is typically less than 0.5% percent. This risk is low in comparison to the normal incidence of human cancer, which is 33% for women and 50% for men according to the [employer] Society. - Radiation induced injury can include skin redness, resembling a rash, tissue breakdown / ulcers and hair loss (which can be temporary or permanent).   The likelihood of either of these occurring depends on the difficulty of the procedure and whether you are sensitive to radiation due to previous procedures, disease, or genetic conditions.   IF your procedure requires a prolonged use of  radiation, you will be notified and given written instructions for further action.  It is your responsibility to monitor the irradiated area for the 2 weeks following the procedure and to notify your physician if you are concerned that you have suffered a radiation induced injury.    All of the patient's questions were answered, patient is agreeable to proceed. Consent signed and in chart.   Thank you for this interesting consult.  I greatly enjoyed meeting Tonette Koehne and look forward to participating in their care.  A copy of this report was sent to the requesting provider on this date.  Electronically Signed: Alene Mires, NP 12/14/2022, 10:03 AM   I spent a total of    25 Minutes in face to face in clinical consultation, greater than 50% of which was counseling/coordinating care for radiofrequency ablation with balloon Kyphoplasty (NIR) and biliary drain exchange and possible upsize

## 2022-12-14 ENCOUNTER — Inpatient Hospital Stay: Payer: Medicaid Other

## 2022-12-14 ENCOUNTER — Other Ambulatory Visit: Payer: Self-pay

## 2022-12-14 ENCOUNTER — Ambulatory Visit (HOSPITAL_COMMUNITY)
Admission: RE | Admit: 2022-12-14 | Discharge: 2022-12-14 | Disposition: A | Discharge status: Home / Self Care | Payer: Medicaid Other | Source: Ambulatory Visit | Attending: Physician Assistant | Admitting: Physician Assistant

## 2022-12-14 ENCOUNTER — Ambulatory Visit (HOSPITAL_COMMUNITY)
Admission: RE | Admit: 2022-12-14 | Discharge: 2022-12-14 | Disposition: A | Discharge status: Home / Self Care | Payer: Medicaid Other | Source: Ambulatory Visit | Attending: Neuroradiology | Admitting: Neuroradiology

## 2022-12-14 VITALS — BP 109/57 | HR 84

## 2022-12-14 DIAGNOSIS — S32010A Wedge compression fracture of first lumbar vertebra, initial encounter for closed fracture: Secondary | ICD-10-CM

## 2022-12-14 DIAGNOSIS — Z434 Encounter for attention to other artificial openings of digestive tract: Secondary | ICD-10-CM | POA: Insufficient documentation

## 2022-12-14 DIAGNOSIS — K831 Obstruction of bile duct: Secondary | ICD-10-CM | POA: Insufficient documentation

## 2022-12-14 DIAGNOSIS — C801 Malignant (primary) neoplasm, unspecified: Secondary | ICD-10-CM | POA: Insufficient documentation

## 2022-12-14 DIAGNOSIS — C189 Malignant neoplasm of colon, unspecified: Secondary | ICD-10-CM | POA: Insufficient documentation

## 2022-12-14 DIAGNOSIS — M899 Disorder of bone, unspecified: Secondary | ICD-10-CM

## 2022-12-14 DIAGNOSIS — C787 Secondary malignant neoplasm of liver and intrahepatic bile duct: Secondary | ICD-10-CM

## 2022-12-14 DIAGNOSIS — M8448XA Pathological fracture, other site, initial encounter for fracture: Secondary | ICD-10-CM | POA: Diagnosis not present

## 2022-12-14 HISTORY — PX: IR BONE TUMOR(S)RF ABLATION: IMG2284

## 2022-12-14 HISTORY — PX: IR KYPHO LUMBAR INC FX REDUCE BONE BX UNI/BIL CANNULATION INC/IMAGING: IMG5519

## 2022-12-14 HISTORY — PX: IR EXCHANGE BILIARY DRAIN: IMG6046

## 2022-12-14 LAB — BASIC METABOLIC PANEL
Anion gap: 13 (ref 5–15)
BUN: 9 mg/dL (ref 6–20)
CO2: 20 mmol/L — ABNORMAL LOW (ref 22–32)
Calcium: 8.8 mg/dL — ABNORMAL LOW (ref 8.9–10.3)
Chloride: 103 mmol/L (ref 98–111)
Creatinine, Ser: 0.67 mg/dL (ref 0.44–1.00)
GFR, Estimated: >60 mL/min (ref >60)
Glucose, Bld: 90 mg/dL (ref 70–99)
Potassium: 4.1 mmol/L (ref 3.5–5.1)
Sodium: 136 mmol/L (ref 135–145)

## 2022-12-14 LAB — CBC
HCT: 38 % (ref 36.0–46.0)
Hemoglobin: 11.7 g/dL — ABNORMAL LOW (ref 12.0–15.0)
MCH: 29.2 pg (ref 26.0–34.0)
MCHC: 30.8 g/dL (ref 30.0–36.0)
MCV: 94.8 fL (ref 80.0–100.0)
Platelets: 258 10*3/uL (ref 150–400)
RBC: 4.01 MIL/uL (ref 3.87–5.11)
RDW: 18.7 % — ABNORMAL HIGH (ref 11.5–15.5)
WBC: 10.6 10*3/uL — ABNORMAL HIGH (ref 4.0–10.5)
nRBC: 0 % (ref 0.0–0.2)

## 2022-12-14 LAB — PROTIME-INR
INR: 1.5 — ABNORMAL HIGH (ref 0.8–1.2)
Prothrombin Time: 18.6 seconds — ABNORMAL HIGH (ref 11.4–15.2)

## 2022-12-14 MED ORDER — LIDOCAINE HCL (PF) 1 % IJ SOLN
30.0000 mL | Freq: Once | INTRAMUSCULAR | Status: AC
  Administered 2022-12-14: 8 mL via INTRADERMAL

## 2022-12-14 MED ORDER — CEFAZOLIN SODIUM-DEXTROSE 2-4 GM/100ML-% IV SOLN
INTRAVENOUS | Status: AC | PRN
  Administered 2022-12-14: 2 g via INTRAVENOUS

## 2022-12-14 MED ORDER — SODIUM CHLORIDE 0.9 % IV SOLN: INTRAVENOUS | Status: DC

## 2022-12-14 MED ORDER — FLUMAZENIL 0.5 MG/5ML IV SOLN
INTRAVENOUS | Status: AC | PRN
  Administered 2022-12-14: .2 mg via INTRAVENOUS

## 2022-12-14 MED ORDER — MIDAZOLAM HCL 2 MG/2ML IJ SOLN
INTRAMUSCULAR | Status: AC | PRN
  Administered 2022-12-14: 1 mg via INTRAVENOUS

## 2022-12-14 MED ORDER — FENTANYL CITRATE (PF) 100 MCG/2ML IJ SOLN
INTRAMUSCULAR | Status: AC | PRN
  Administered 2022-12-14: 50 ug via INTRAVENOUS

## 2022-12-14 MED ORDER — HEPARIN SOD (PORK) LOCK FLUSH 100 UNIT/ML IV SOLN
500.0000 [IU] | INTRAVENOUS | Status: AC | PRN
  Administered 2022-12-14: 500 [IU]

## 2022-12-14 MED ORDER — CEFAZOLIN SODIUM-DEXTROSE 2-4 GM/100ML-% IV SOLN: 2.0000 g | INTRAVENOUS | Status: DC

## 2022-12-14 MED ORDER — KETOROLAC TROMETHAMINE 30 MG/ML IJ SOLN
INTRAMUSCULAR | Status: AC | PRN
  Administered 2022-12-14: 30 mg via INTRAVENOUS

## 2022-12-14 MED ORDER — HYDROMORPHONE HCL 2 MG PO TABS: 2.0000 mg | ORAL_TABLET | ORAL | Status: DC | PRN

## 2022-12-14 MED ORDER — IOHEXOL 300 MG/ML  SOLN
50.0000 mL | Freq: Once | INTRAMUSCULAR | Status: AC | PRN
  Administered 2022-12-14: 8 mL

## 2022-12-14 MED ORDER — FENTANYL CITRATE (PF) 100 MCG/2ML IJ SOLN
INTRAMUSCULAR | Status: AC | PRN
  Administered 2022-12-14 (×6): 50 ug via INTRAVENOUS

## 2022-12-14 MED ORDER — MIDAZOLAM HCL 2 MG/2ML IJ SOLN
INTRAMUSCULAR | Status: AC | PRN
  Administered 2022-12-14 (×6): 1 mg via INTRAVENOUS

## 2022-12-14 NOTE — Procedures (Signed)
Pre procedural Diagnosis: Biliary Obstruction Post procedural Diagnosis: Same  Successful fluroscopic exchange and up-sizing of left sided approach transhepatic now 12 Fr biliary drainage catheter with end coiled and locked within the duodenum.  Biliary drain connected to gravity bag.  EBL: None No immediate post procedural complications.  Katherina Right, MD Pager #: [phone]

## 2022-12-14 NOTE — Procedures (Signed)
INTERVENTIONAL NEURORADIOLOGY BRIEF POSTPROCEDURE NOTE  FLUOROSCOPY GUIDED L1 CORE BONE BIOPSY, RADIOFREQUENCY ABLATION BALLOON KYPHOPLASTY    Attending: Dr. Baldemar Lenis   Diagnosis: L1 pathologic fracture    Access site: Percutaneous, bilateral transpedicular    Anesthesia: Moderate sedation    Medication used: 6 mg Versed IV; 300 mcg Fentanyl IV; 30 mg Toradol IV and 0.2 mg of Romazicon IV.   Complications: None    Estimated blood loss: None    Specimen: L1 core biopsy    Findings: Pathologic fracture of the L1 vertebral body again seen.  The core bone biopsy sample was obtained prior right pedicle access.  Subsequently, bilateral transpedicular approach was utilized for radiofrequency ablation followed by balloon kyphoplasty.    Towards the end of the procedure, patient became incoherent and agitated.  Reversal of Versed was then performed with resolution of symptoms.  She was then transferred to short-stay in stable condition.

## 2022-12-14 NOTE — Progress Notes (Signed)
Patient and daughter was giving discharge instructions. Both verbalized understanding.

## 2022-12-14 NOTE — Sedation Documentation (Signed)
Patient tube in, continued pain at 9 as prior to procedure

## 2022-12-15 ENCOUNTER — Other Ambulatory Visit (HOSPITAL_COMMUNITY): Payer: Self-pay

## 2022-12-15 ENCOUNTER — Other Ambulatory Visit (HOSPITAL_BASED_OUTPATIENT_CLINIC_OR_DEPARTMENT_OTHER): Payer: Self-pay

## 2022-12-15 ENCOUNTER — Other Ambulatory Visit: Payer: Self-pay | Admitting: Hematology and Oncology

## 2022-12-15 ENCOUNTER — Encounter: Payer: Self-pay | Admitting: Family

## 2022-12-17 ENCOUNTER — Other Ambulatory Visit (HOSPITAL_BASED_OUTPATIENT_CLINIC_OR_DEPARTMENT_OTHER): Payer: Self-pay

## 2022-12-17 ENCOUNTER — Other Ambulatory Visit: Payer: Self-pay | Admitting: Hematology & Oncology

## 2022-12-17 MED ORDER — MORPHINE SULFATE ER 15 MG PO TBCR
15.0000 mg | EXTENDED_RELEASE_TABLET | Freq: Three times a day (TID) | ORAL | 0 refills | Status: DC
  Filled 2022-12-17: qty 90, 30d supply, fill #0

## 2022-12-19 ENCOUNTER — Encounter: Payer: Self-pay | Admitting: Family

## 2022-12-19 ENCOUNTER — Other Ambulatory Visit (HOSPITAL_BASED_OUTPATIENT_CLINIC_OR_DEPARTMENT_OTHER): Payer: Self-pay

## 2022-12-19 LAB — SURGICAL PATHOLOGY

## 2022-12-24 ENCOUNTER — Other Ambulatory Visit (HOSPITAL_COMMUNITY): Payer: Medicaid Other

## 2022-12-24 ENCOUNTER — Other Ambulatory Visit (HOSPITAL_BASED_OUTPATIENT_CLINIC_OR_DEPARTMENT_OTHER): Payer: Self-pay

## 2022-12-24 MED ORDER — ALPRAZOLAM 0.5 MG PO TABS
0.5000 mg | ORAL_TABLET | ORAL | 0 refills | Status: DC | PRN
  Filled 2022-12-24: qty 30, 5d supply, fill #0

## 2022-12-24 MED ORDER — GABAPENTIN 300 MG PO CAPS
300.0000 mg | ORAL_CAPSULE | Freq: Three times a day (TID) | ORAL | 2 refills | Status: DC
  Filled 2022-12-24: qty 42, 14d supply, fill #0
  Filled 2023-01-04: qty 42, 14d supply, fill #1

## 2022-12-25 ENCOUNTER — Other Ambulatory Visit (HOSPITAL_BASED_OUTPATIENT_CLINIC_OR_DEPARTMENT_OTHER): Payer: Self-pay

## 2022-12-25 MED ORDER — PREDNISONE 10 MG PO TABS
10.0000 mg | ORAL_TABLET | Freq: Every day | ORAL | 1 refills | Status: DC
  Filled 2022-12-25: qty 30, 30d supply, fill #0

## 2022-12-25 MED ORDER — DOCUSATE SODIUM 100 MG PO CAPS
100.0000 mg | ORAL_CAPSULE | Freq: Every day | ORAL | 1 refills | Status: DC
  Filled 2022-12-25: qty 30, 30d supply, fill #0

## 2022-12-25 MED ORDER — OLANZAPINE 10 MG PO TABS
10.0000 mg | ORAL_TABLET | Freq: Every day | ORAL | 1 refills | Status: DC
  Filled 2022-12-25: qty 30, 30d supply, fill #0

## 2022-12-26 ENCOUNTER — Inpatient Hospital Stay: Payer: Medicaid Other

## 2022-12-26 ENCOUNTER — Inpatient Hospital Stay: Payer: Medicaid Other | Admitting: Hematology & Oncology

## 2022-12-27 ENCOUNTER — Other Ambulatory Visit (HOSPITAL_BASED_OUTPATIENT_CLINIC_OR_DEPARTMENT_OTHER): Payer: Self-pay

## 2022-12-27 ENCOUNTER — Encounter: Payer: Self-pay | Admitting: Family

## 2022-12-28 ENCOUNTER — Other Ambulatory Visit (HOSPITAL_BASED_OUTPATIENT_CLINIC_OR_DEPARTMENT_OTHER): Payer: Self-pay

## 2022-12-28 ENCOUNTER — Inpatient Hospital Stay: Payer: Medicaid Other

## 2023-01-01 ENCOUNTER — Other Ambulatory Visit (HOSPITAL_BASED_OUTPATIENT_CLINIC_OR_DEPARTMENT_OTHER): Payer: Self-pay

## 2023-01-01 MED ORDER — METHADONE HCL 5 MG PO TABS
2.5000 mg | ORAL_TABLET | Freq: Three times a day (TID) | ORAL | 0 refills | Status: DC
  Filled 2023-01-01: qty 15, 10d supply, fill #0

## 2023-01-03 ENCOUNTER — Other Ambulatory Visit (HOSPITAL_BASED_OUTPATIENT_CLINIC_OR_DEPARTMENT_OTHER): Payer: Self-pay

## 2023-01-03 ENCOUNTER — Inpatient Hospital Stay: Payer: Medicaid Other

## 2023-01-03 ENCOUNTER — Telehealth: Payer: Self-pay | Admitting: *Deleted

## 2023-01-03 ENCOUNTER — Inpatient Hospital Stay: Payer: Medicaid Other | Admitting: Hematology & Oncology

## 2023-01-03 MED ORDER — GABAPENTIN 300 MG PO CAPS
300.0000 mg | ORAL_CAPSULE | Freq: Three times a day (TID) | ORAL | 0 refills | Status: DC
  Filled 2023-01-03 (×2): qty 45, 15d supply, fill #0

## 2023-01-03 MED ORDER — HALOPERIDOL 5 MG PO TABS
5.0000 mg | ORAL_TABLET | ORAL | 0 refills | Status: DC | PRN
  Filled 2023-01-03: qty 60, 10d supply, fill #0
  Filled 2023-01-05: qty 24, 4d supply, fill #0

## 2023-01-03 MED ORDER — ALPRAZOLAM 0.5 MG PO TABS
0.5000 mg | ORAL_TABLET | ORAL | 0 refills | Status: DC | PRN
  Filled 2023-01-03: qty 90, 15d supply, fill #0

## 2023-01-03 NOTE — Telephone Encounter (Signed)
Call placed to patient's daughter Darl Pikes to check on patient.  Darl Pikes states that pt will not be coming in today d/t pt.'s decline.  Emotional support given.  Dr. Myna Hidalgo notified and message sent to scheduling.

## 2023-01-04 ENCOUNTER — Other Ambulatory Visit (HOSPITAL_BASED_OUTPATIENT_CLINIC_OR_DEPARTMENT_OTHER): Payer: Self-pay

## 2023-01-05 ENCOUNTER — Other Ambulatory Visit (HOSPITAL_COMMUNITY): Payer: Self-pay

## 2023-01-07 ENCOUNTER — Other Ambulatory Visit (HOSPITAL_BASED_OUTPATIENT_CLINIC_OR_DEPARTMENT_OTHER): Payer: Self-pay

## 2023-01-08 ENCOUNTER — Other Ambulatory Visit (HOSPITAL_BASED_OUTPATIENT_CLINIC_OR_DEPARTMENT_OTHER): Payer: Self-pay

## 2023-01-09 ENCOUNTER — Other Ambulatory Visit (HOSPITAL_BASED_OUTPATIENT_CLINIC_OR_DEPARTMENT_OTHER): Payer: Self-pay

## 2023-01-09 ENCOUNTER — Other Ambulatory Visit (HOSPITAL_COMMUNITY): Payer: Self-pay

## 2023-01-09 MED ORDER — METHADONE HCL 5 MG PO TABS
2.5000 mg | ORAL_TABLET | Freq: Three times a day (TID) | ORAL | 0 refills | Status: DC
  Filled 2023-01-09: qty 15, 10d supply, fill #0

## 2023-01-09 MED ORDER — PHENOBARBITAL 64.8 MG PO TABS
64.8000 mg | ORAL_TABLET | Freq: Two times a day (BID) | ORAL | 0 refills | Status: DC
  Filled 2023-01-09 (×2): qty 20, 10d supply, fill #0

## 2023-01-10 ENCOUNTER — Other Ambulatory Visit (HOSPITAL_COMMUNITY): Payer: Self-pay

## 2023-01-14 ENCOUNTER — Other Ambulatory Visit (HOSPITAL_BASED_OUTPATIENT_CLINIC_OR_DEPARTMENT_OTHER): Payer: Self-pay

## 2023-01-17 ENCOUNTER — Other Ambulatory Visit (HOSPITAL_BASED_OUTPATIENT_CLINIC_OR_DEPARTMENT_OTHER): Payer: Self-pay

## 2023-01-25 ENCOUNTER — Inpatient Hospital Stay (HOSPITAL_COMMUNITY)
Admission: RE | Admit: 2023-01-25 | Discharge: 2023-01-25 | Disposition: A | Discharge status: Home / Self Care | Payer: Medicaid Other | Source: Ambulatory Visit | Attending: Student | Admitting: Student

## 2023-01-27 DEATH — deceased
